# Patient Record
Sex: Male | Born: 1970 | Race: Black or African American | Hispanic: No | Marital: Single | Smoking: Never smoker
Health system: Southern US, Community
[De-identification: ages and names within clinical notes are randomized; demographics above are authoritative.]

## PROBLEM LIST (undated history)

## (undated) DIAGNOSIS — T8859XA Other complications of anesthesia, initial encounter: Secondary | ICD-10-CM

## (undated) DIAGNOSIS — R011 Cardiac murmur, unspecified: Secondary | ICD-10-CM

## (undated) DIAGNOSIS — I10 Essential (primary) hypertension: Secondary | ICD-10-CM

## (undated) DIAGNOSIS — D509 Iron deficiency anemia, unspecified: Secondary | ICD-10-CM

## (undated) DIAGNOSIS — Z992 Dependence on renal dialysis: Secondary | ICD-10-CM

## (undated) DIAGNOSIS — F429 Obsessive-compulsive disorder, unspecified: Secondary | ICD-10-CM

## (undated) DIAGNOSIS — N400 Enlarged prostate without lower urinary tract symptoms: Secondary | ICD-10-CM

## (undated) DIAGNOSIS — F840 Autistic disorder: Secondary | ICD-10-CM

## (undated) DIAGNOSIS — N186 End stage renal disease: Secondary | ICD-10-CM

## (undated) DIAGNOSIS — E669 Obesity, unspecified: Secondary | ICD-10-CM

## (undated) DIAGNOSIS — N189 Chronic kidney disease, unspecified: Secondary | ICD-10-CM

## (undated) DIAGNOSIS — N2581 Secondary hyperparathyroidism of renal origin: Secondary | ICD-10-CM

---

## 2020-04-02 ENCOUNTER — Other Ambulatory Visit: Payer: Self-pay

## 2020-04-02 ENCOUNTER — Emergency Department: Payer: Medicare Other

## 2020-04-02 DIAGNOSIS — D696 Thrombocytopenia, unspecified: Secondary | ICD-10-CM | POA: Diagnosis present

## 2020-04-02 DIAGNOSIS — E669 Obesity, unspecified: Secondary | ICD-10-CM | POA: Diagnosis present

## 2020-04-02 DIAGNOSIS — I161 Hypertensive emergency: Secondary | ICD-10-CM | POA: Diagnosis present

## 2020-04-02 DIAGNOSIS — N320 Bladder-neck obstruction: Secondary | ICD-10-CM | POA: Diagnosis present

## 2020-04-02 DIAGNOSIS — M6282 Rhabdomyolysis: Secondary | ICD-10-CM | POA: Diagnosis present

## 2020-04-02 DIAGNOSIS — T471X5A Adverse effect of other antacids and anti-gastric-secretion drugs, initial encounter: Secondary | ICD-10-CM | POA: Diagnosis not present

## 2020-04-02 DIAGNOSIS — R338 Other retention of urine: Secondary | ICD-10-CM | POA: Diagnosis present

## 2020-04-02 DIAGNOSIS — N179 Acute kidney failure, unspecified: Secondary | ICD-10-CM | POA: Diagnosis not present

## 2020-04-02 DIAGNOSIS — R809 Proteinuria, unspecified: Secondary | ICD-10-CM | POA: Diagnosis present

## 2020-04-02 DIAGNOSIS — N401 Enlarged prostate with lower urinary tract symptoms: Secondary | ICD-10-CM | POA: Diagnosis present

## 2020-04-02 DIAGNOSIS — Y92230 Patient room in hospital as the place of occurrence of the external cause: Secondary | ICD-10-CM | POA: Diagnosis not present

## 2020-04-02 DIAGNOSIS — R7401 Elevation of levels of liver transaminase levels: Secondary | ICD-10-CM | POA: Diagnosis present

## 2020-04-02 DIAGNOSIS — R748 Abnormal levels of other serum enzymes: Secondary | ICD-10-CM | POA: Diagnosis present

## 2020-04-02 DIAGNOSIS — Y848 Other medical procedures as the cause of abnormal reaction of the patient, or of later complication, without mention of misadventure at the time of the procedure: Secondary | ICD-10-CM | POA: Diagnosis not present

## 2020-04-02 DIAGNOSIS — N186 End stage renal disease: Secondary | ICD-10-CM | POA: Diagnosis present

## 2020-04-02 DIAGNOSIS — I120 Hypertensive chronic kidney disease with stage 5 chronic kidney disease or end stage renal disease: Secondary | ICD-10-CM | POA: Diagnosis present

## 2020-04-02 DIAGNOSIS — S60821A Blister (nonthermal) of right wrist, initial encounter: Secondary | ICD-10-CM | POA: Diagnosis not present

## 2020-04-02 DIAGNOSIS — E876 Hypokalemia: Secondary | ICD-10-CM | POA: Diagnosis present

## 2020-04-02 DIAGNOSIS — Z992 Dependence on renal dialysis: Secondary | ICD-10-CM

## 2020-04-02 DIAGNOSIS — R000 Tachycardia, unspecified: Secondary | ICD-10-CM | POA: Diagnosis not present

## 2020-04-02 DIAGNOSIS — R1110 Vomiting, unspecified: Secondary | ICD-10-CM | POA: Diagnosis not present

## 2020-04-02 DIAGNOSIS — D631 Anemia in chronic kidney disease: Secondary | ICD-10-CM | POA: Diagnosis present

## 2020-04-02 DIAGNOSIS — Z883 Allergy status to other anti-infective agents status: Secondary | ICD-10-CM

## 2020-04-02 DIAGNOSIS — F429 Obsessive-compulsive disorder, unspecified: Secondary | ICD-10-CM | POA: Diagnosis present

## 2020-04-02 DIAGNOSIS — Z20822 Contact with and (suspected) exposure to covid-19: Secondary | ICD-10-CM | POA: Diagnosis present

## 2020-04-02 DIAGNOSIS — I959 Hypotension, unspecified: Secondary | ICD-10-CM | POA: Diagnosis not present

## 2020-04-02 DIAGNOSIS — D509 Iron deficiency anemia, unspecified: Secondary | ICD-10-CM | POA: Diagnosis present

## 2020-04-02 DIAGNOSIS — S60521A Blister (nonthermal) of right hand, initial encounter: Secondary | ICD-10-CM | POA: Diagnosis not present

## 2020-04-02 DIAGNOSIS — R9431 Abnormal electrocardiogram [ECG] [EKG]: Secondary | ICD-10-CM | POA: Diagnosis present

## 2020-04-02 DIAGNOSIS — N2581 Secondary hyperparathyroidism of renal origin: Secondary | ICD-10-CM | POA: Diagnosis present

## 2020-04-02 DIAGNOSIS — R778 Other specified abnormalities of plasma proteins: Secondary | ICD-10-CM | POA: Diagnosis present

## 2020-04-02 DIAGNOSIS — F819 Developmental disorder of scholastic skills, unspecified: Secondary | ICD-10-CM | POA: Diagnosis present

## 2020-04-02 DIAGNOSIS — E872 Acidosis: Secondary | ICD-10-CM | POA: Diagnosis present

## 2020-04-02 DIAGNOSIS — F840 Autistic disorder: Secondary | ICD-10-CM | POA: Diagnosis present

## 2020-04-02 DIAGNOSIS — Z841 Family history of disorders of kidney and ureter: Secondary | ICD-10-CM

## 2020-04-02 DIAGNOSIS — K529 Noninfective gastroenteritis and colitis, unspecified: Secondary | ICD-10-CM | POA: Diagnosis present

## 2020-04-02 DIAGNOSIS — Z6837 Body mass index (BMI) 37.0-37.9, adult: Secondary | ICD-10-CM

## 2020-04-02 DIAGNOSIS — K089 Disorder of teeth and supporting structures, unspecified: Secondary | ICD-10-CM | POA: Diagnosis present

## 2020-04-02 DIAGNOSIS — T80818A Extravasation of other vesicant agent, initial encounter: Secondary | ICD-10-CM | POA: Diagnosis not present

## 2020-04-02 DIAGNOSIS — N138 Other obstructive and reflux uropathy: Secondary | ICD-10-CM | POA: Diagnosis present

## 2020-04-02 LAB — COMPREHENSIVE METABOLIC PANEL
ALT: 69 U/L — ABNORMAL HIGH (ref 0–44)
AST: 51 U/L — ABNORMAL HIGH (ref 15–41)
Albumin: 3.8 g/dL (ref 3.5–5.0)
Alkaline Phosphatase: 58 U/L (ref 38–126)
Anion gap: 17 — ABNORMAL HIGH (ref 5–15)
BUN: 74 mg/dL — ABNORMAL HIGH (ref 6–20)
CO2: 14 mmol/L — ABNORMAL LOW (ref 22–32)
Calcium: 4.1 mg/dL — CL (ref 8.9–10.3)
Chloride: 108 mmol/L (ref 98–111)
Creatinine, Ser: 14.88 mg/dL — ABNORMAL HIGH (ref 0.61–1.24)
GFR calc Af Amer: 4 mL/min — ABNORMAL LOW (ref >60)
GFR calc non Af Amer: 3 mL/min — ABNORMAL LOW (ref >60)
Glucose, Bld: 91 mg/dL (ref 70–99)
Potassium: 3.9 mmol/L (ref 3.5–5.1)
Sodium: 139 mmol/L (ref 135–145)
Total Bilirubin: 0.5 mg/dL (ref 0.3–1.2)
Total Protein: 7 g/dL (ref 6.5–8.1)

## 2020-04-02 LAB — CBC
HCT: 30.3 % — ABNORMAL LOW (ref 39.0–52.0)
Hemoglobin: 10.1 g/dL — ABNORMAL LOW (ref 13.0–17.0)
MCH: 27.7 pg (ref 26.0–34.0)
MCHC: 33.3 g/dL (ref 30.0–36.0)
MCV: 83.2 fL (ref 80.0–100.0)
Platelets: 180 10*3/uL (ref 150–400)
RBC: 3.64 MIL/uL — ABNORMAL LOW (ref 4.22–5.81)
RDW: 14.8 % (ref 11.5–15.5)
WBC: 9.8 10*3/uL (ref 4.0–10.5)
nRBC: 0 % (ref 0.0–0.2)

## 2020-04-02 LAB — LIPASE, BLOOD: Lipase: 90 U/L — ABNORMAL HIGH (ref 11–51)

## 2020-04-02 NOTE — ED Triage Notes (Signed)
Pt comes from home with family due to having one episode of vomiting today and shortness of breath. Pt is reported to have excessive salivation since vomiting at home and father states this has been going on for the last 2 weeks. Pt also reports SOB but no distress is noted.Reproted that pt has vomited x2 in two weeks, pt father states that he used a portable pulse ox at home and believes the oxygen sats read 85% but in ED with triage pt is 100% on RA

## 2020-04-03 ENCOUNTER — Inpatient Hospital Stay
Admission: EM | Admit: 2020-04-03 | Discharge: 2020-04-14 | DRG: 674 | Disposition: A | Discharge status: Home Health | Payer: Medicare Other | Attending: Internal Medicine | Admitting: Internal Medicine

## 2020-04-03 ENCOUNTER — Emergency Department: Payer: Medicare Other

## 2020-04-03 DIAGNOSIS — I160 Hypertensive urgency: Secondary | ICD-10-CM

## 2020-04-03 DIAGNOSIS — Z20822 Contact with and (suspected) exposure to covid-19: Secondary | ICD-10-CM | POA: Diagnosis present

## 2020-04-03 DIAGNOSIS — T471X5A Adverse effect of other antacids and anti-gastric-secretion drugs, initial encounter: Secondary | ICD-10-CM | POA: Diagnosis not present

## 2020-04-03 DIAGNOSIS — K047 Periapical abscess without sinus: Secondary | ICD-10-CM | POA: Diagnosis not present

## 2020-04-03 DIAGNOSIS — R1110 Vomiting, unspecified: Secondary | ICD-10-CM | POA: Diagnosis present

## 2020-04-03 DIAGNOSIS — I10 Essential (primary) hypertension: Secondary | ICD-10-CM | POA: Insufficient documentation

## 2020-04-03 DIAGNOSIS — N401 Enlarged prostate with lower urinary tract symptoms: Secondary | ICD-10-CM | POA: Diagnosis present

## 2020-04-03 DIAGNOSIS — N138 Other obstructive and reflux uropathy: Secondary | ICD-10-CM | POA: Diagnosis present

## 2020-04-03 DIAGNOSIS — N320 Bladder-neck obstruction: Secondary | ICD-10-CM | POA: Diagnosis present

## 2020-04-03 DIAGNOSIS — N189 Chronic kidney disease, unspecified: Secondary | ICD-10-CM | POA: Diagnosis not present

## 2020-04-03 DIAGNOSIS — I161 Hypertensive emergency: Secondary | ICD-10-CM | POA: Diagnosis present

## 2020-04-03 DIAGNOSIS — R9431 Abnormal electrocardiogram [ECG] [EKG]: Secondary | ICD-10-CM

## 2020-04-03 DIAGNOSIS — F429 Obsessive-compulsive disorder, unspecified: Secondary | ICD-10-CM | POA: Diagnosis present

## 2020-04-03 DIAGNOSIS — K089 Disorder of teeth and supporting structures, unspecified: Secondary | ICD-10-CM | POA: Diagnosis present

## 2020-04-03 DIAGNOSIS — R748 Abnormal levels of other serum enzymes: Secondary | ICD-10-CM | POA: Diagnosis present

## 2020-04-03 DIAGNOSIS — R7989 Other specified abnormal findings of blood chemistry: Secondary | ICD-10-CM | POA: Diagnosis present

## 2020-04-03 DIAGNOSIS — R531 Weakness: Secondary | ICD-10-CM | POA: Diagnosis not present

## 2020-04-03 DIAGNOSIS — R778 Other specified abnormalities of plasma proteins: Secondary | ICD-10-CM | POA: Diagnosis present

## 2020-04-03 DIAGNOSIS — R509 Fever, unspecified: Secondary | ICD-10-CM | POA: Diagnosis not present

## 2020-04-03 DIAGNOSIS — F840 Autistic disorder: Secondary | ICD-10-CM | POA: Diagnosis present

## 2020-04-03 DIAGNOSIS — N400 Enlarged prostate without lower urinary tract symptoms: Secondary | ICD-10-CM | POA: Diagnosis present

## 2020-04-03 DIAGNOSIS — R339 Retention of urine, unspecified: Secondary | ICD-10-CM | POA: Diagnosis not present

## 2020-04-03 DIAGNOSIS — E872 Acidosis, unspecified: Secondary | ICD-10-CM | POA: Diagnosis present

## 2020-04-03 DIAGNOSIS — M6282 Rhabdomyolysis: Secondary | ICD-10-CM

## 2020-04-03 DIAGNOSIS — S60521A Blister (nonthermal) of right hand, initial encounter: Secondary | ICD-10-CM | POA: Diagnosis not present

## 2020-04-03 DIAGNOSIS — I129 Hypertensive chronic kidney disease with stage 1 through stage 4 chronic kidney disease, or unspecified chronic kidney disease: Secondary | ICD-10-CM | POA: Diagnosis not present

## 2020-04-03 DIAGNOSIS — I120 Hypertensive chronic kidney disease with stage 5 chronic kidney disease or end stage renal disease: Secondary | ICD-10-CM | POA: Diagnosis present

## 2020-04-03 DIAGNOSIS — K529 Noninfective gastroenteritis and colitis, unspecified: Secondary | ICD-10-CM

## 2020-04-03 DIAGNOSIS — D638 Anemia in other chronic diseases classified elsewhere: Secondary | ICD-10-CM | POA: Diagnosis not present

## 2020-04-03 DIAGNOSIS — Z992 Dependence on renal dialysis: Secondary | ICD-10-CM | POA: Diagnosis not present

## 2020-04-03 DIAGNOSIS — N186 End stage renal disease: Secondary | ICD-10-CM | POA: Diagnosis present

## 2020-04-03 DIAGNOSIS — Y848 Other medical procedures as the cause of abnormal reaction of the patient, or of later complication, without mention of misadventure at the time of the procedure: Secondary | ICD-10-CM | POA: Diagnosis not present

## 2020-04-03 DIAGNOSIS — N185 Chronic kidney disease, stage 5: Secondary | ICD-10-CM | POA: Diagnosis not present

## 2020-04-03 DIAGNOSIS — D696 Thrombocytopenia, unspecified: Secondary | ICD-10-CM | POA: Diagnosis present

## 2020-04-03 DIAGNOSIS — S60821A Blister (nonthermal) of right wrist, initial encounter: Secondary | ICD-10-CM | POA: Diagnosis not present

## 2020-04-03 DIAGNOSIS — D631 Anemia in chronic kidney disease: Secondary | ICD-10-CM | POA: Diagnosis present

## 2020-04-03 DIAGNOSIS — N179 Acute kidney failure, unspecified: Secondary | ICD-10-CM

## 2020-04-03 DIAGNOSIS — T80818A Extravasation of other vesicant agent, initial encounter: Secondary | ICD-10-CM | POA: Diagnosis not present

## 2020-04-03 DIAGNOSIS — Y92230 Patient room in hospital as the place of occurrence of the external cause: Secondary | ICD-10-CM | POA: Diagnosis not present

## 2020-04-03 HISTORY — DX: Cardiac murmur, unspecified: R01.1

## 2020-04-03 HISTORY — DX: Obsessive-compulsive disorder, unspecified: F42.9

## 2020-04-03 HISTORY — DX: Hypertensive urgency: I16.0

## 2020-04-03 HISTORY — DX: Autistic disorder: F84.0

## 2020-04-03 LAB — COMPREHENSIVE METABOLIC PANEL
ALT: 66 U/L — ABNORMAL HIGH (ref 0–44)
AST: 48 U/L — ABNORMAL HIGH (ref 15–41)
Albumin: 3.9 g/dL (ref 3.5–5.0)
Alkaline Phosphatase: 62 U/L (ref 38–126)
Anion gap: 14 (ref 5–15)
BUN: 73 mg/dL — ABNORMAL HIGH (ref 6–20)
CO2: 19 mmol/L — ABNORMAL LOW (ref 22–32)
Calcium: 4.1 mg/dL — CL (ref 8.9–10.3)
Chloride: 110 mmol/L (ref 98–111)
Creatinine, Ser: 15.2 mg/dL — ABNORMAL HIGH (ref 0.61–1.24)
GFR calc Af Amer: 4 mL/min — ABNORMAL LOW (ref >60)
GFR calc non Af Amer: 3 mL/min — ABNORMAL LOW (ref >60)
Glucose, Bld: 96 mg/dL (ref 70–99)
Potassium: 4.1 mmol/L (ref 3.5–5.1)
Sodium: 143 mmol/L (ref 135–145)
Total Bilirubin: 0.8 mg/dL (ref 0.3–1.2)
Total Protein: 7.6 g/dL (ref 6.5–8.1)

## 2020-04-03 LAB — CBC
HCT: 29.4 % — ABNORMAL LOW (ref 39.0–52.0)
Hemoglobin: 9.9 g/dL — ABNORMAL LOW (ref 13.0–17.0)
MCH: 28.1 pg (ref 26.0–34.0)
MCHC: 33.7 g/dL (ref 30.0–36.0)
MCV: 83.5 fL (ref 80.0–100.0)
Platelets: 189 10*3/uL (ref 150–400)
RBC: 3.52 MIL/uL — ABNORMAL LOW (ref 4.22–5.81)
RDW: 14.8 % (ref 11.5–15.5)
WBC: 10.9 10*3/uL — ABNORMAL HIGH (ref 4.0–10.5)
nRBC: 0 % (ref 0.0–0.2)

## 2020-04-03 LAB — MAGNESIUM: Magnesium: 1.5 mg/dL — ABNORMAL LOW (ref 1.7–2.4)

## 2020-04-03 LAB — URINALYSIS, COMPLETE (UACMP) WITH MICROSCOPIC
Bilirubin Urine: NEGATIVE
Glucose, UA: NEGATIVE mg/dL
Ketones, ur: NEGATIVE mg/dL
Leukocytes,Ua: NEGATIVE
Nitrite: NEGATIVE
Protein, ur: 100 mg/dL — AB
Specific Gravity, Urine: 1.011 (ref 1.005–1.030)
pH: 5 (ref 5.0–8.0)

## 2020-04-03 LAB — BASIC METABOLIC PANEL
Anion gap: 15 (ref 5–15)
BUN: 74 mg/dL — ABNORMAL HIGH (ref 6–20)
CO2: 14 mmol/L — ABNORMAL LOW (ref 22–32)
Calcium: 4.4 mg/dL — CL (ref 8.9–10.3)
Chloride: 111 mmol/L (ref 98–111)
Creatinine, Ser: 15.07 mg/dL — ABNORMAL HIGH (ref 0.61–1.24)
GFR calc Af Amer: 4 mL/min — ABNORMAL LOW (ref >60)
GFR calc non Af Amer: 3 mL/min — ABNORMAL LOW (ref >60)
Glucose, Bld: 108 mg/dL — ABNORMAL HIGH (ref 70–99)
Potassium: 4 mmol/L (ref 3.5–5.1)
Sodium: 140 mmol/L (ref 135–145)

## 2020-04-03 LAB — PROTEIN / CREATININE RATIO, URINE
Creatinine, Urine: 187 mg/dL
Protein Creatinine Ratio: 0.41 mg/mg{Cre} — ABNORMAL HIGH (ref 0.00–0.15)
Total Protein, Urine: 76 mg/dL

## 2020-04-03 LAB — TSH: TSH: 0.509 u[IU]/mL (ref 0.350–4.500)

## 2020-04-03 LAB — TROPONIN I (HIGH SENSITIVITY)
Troponin I (High Sensitivity): 38 ng/L — ABNORMAL HIGH (ref <18)
Troponin I (High Sensitivity): 43 ng/L — ABNORMAL HIGH (ref <18)

## 2020-04-03 LAB — SARS CORONAVIRUS 2 BY RT PCR (HOSPITAL ORDER, PERFORMED IN ~~LOC~~ HOSPITAL LAB): SARS Coronavirus 2: NEGATIVE

## 2020-04-03 LAB — T4, FREE: Free T4: 1.09 ng/dL (ref 0.61–1.12)

## 2020-04-03 LAB — HIV ANTIBODY (ROUTINE TESTING W REFLEX)
HIV Screen 4th Generation wRfx: NONREACTIVE
HIV Screen 4th Generation wRfx: NONREACTIVE

## 2020-04-03 LAB — HEPATITIS B SURFACE ANTIGEN: Hepatitis B Surface Ag: NONREACTIVE

## 2020-04-03 LAB — CK: Total CK: 2732 U/L — ABNORMAL HIGH (ref 49–397)

## 2020-04-03 LAB — PHOSPHORUS: Phosphorus: 6.3 mg/dL — ABNORMAL HIGH (ref 2.5–4.6)

## 2020-04-03 MED ORDER — LABETALOL HCL 5 MG/ML IV SOLN
10.0000 mg | INTRAVENOUS | Status: DC | PRN
  Administered 2020-04-03 – 2020-04-04 (×4): 10 mg via INTRAVENOUS
  Filled 2020-04-03 (×5): qty 4

## 2020-04-03 MED ORDER — ACETAMINOPHEN 650 MG RE SUPP: 650.0000 mg | Freq: Four times a day (QID) | RECTAL | Status: DC | PRN

## 2020-04-03 MED ORDER — CALCIUM GLUCONATE-NACL 1-0.675 GM/50ML-% IV SOLN
1.0000 g | Freq: Once | INTRAVENOUS | Status: AC
  Administered 2020-04-03: 1000 mg via INTRAVENOUS
  Filled 2020-04-03: qty 50

## 2020-04-03 MED ORDER — SODIUM BICARBONATE-DEXTROSE 150-5 MEQ/L-% IV SOLN: 150.0000 meq | INTRAVENOUS | Status: DC

## 2020-04-03 MED ORDER — CALCIUM GLUCONATE-NACL 1-0.675 GM/50ML-% IV SOLN
1.0000 g | Freq: Three times a day (TID) | INTRAVENOUS | Status: AC
  Administered 2020-04-03 – 2020-04-05 (×6): 1000 mg via INTRAVENOUS
  Filled 2020-04-03 (×6): qty 50

## 2020-04-03 MED ORDER — HYDRALAZINE HCL 20 MG/ML IJ SOLN
10.0000 mg | INTRAMUSCULAR | Status: DC | PRN
  Administered 2020-04-03 – 2020-04-08 (×9): 10 mg via INTRAVENOUS
  Filled 2020-04-03 (×11): qty 1

## 2020-04-03 MED ORDER — AMLODIPINE BESYLATE 5 MG PO TABS
5.0000 mg | ORAL_TABLET | Freq: Every day | ORAL | Status: DC
  Administered 2020-04-03 – 2020-04-04 (×2): 5 mg via ORAL
  Filled 2020-04-03 (×2): qty 1

## 2020-04-03 MED ORDER — SODIUM BICARBONATE 8.4 % IV SOLN
INTRAVENOUS | Status: DC
  Filled 2020-04-03 (×12): qty 150

## 2020-04-03 MED ORDER — ACETAMINOPHEN 325 MG PO TABS
650.0000 mg | ORAL_TABLET | Freq: Four times a day (QID) | ORAL | Status: DC | PRN
  Administered 2020-04-03 – 2020-04-07 (×6): 650 mg via ORAL
  Filled 2020-04-03 (×7): qty 2

## 2020-04-03 MED ORDER — CALCIUM GLUCONATE 10 % IV SOLN: 1.0000 g | Freq: Once | INTRAVENOUS | Status: DC

## 2020-04-03 MED ORDER — LORAZEPAM 0.5 MG PO TABS
0.5000 mg | ORAL_TABLET | Freq: Three times a day (TID) | ORAL | Status: DC | PRN
  Administered 2020-04-03: 0.5 mg via ORAL
  Filled 2020-04-03: qty 1

## 2020-04-03 MED ORDER — HEPARIN SODIUM (PORCINE) 5000 UNIT/ML IJ SOLN
5000.0000 [IU] | Freq: Three times a day (TID) | INTRAMUSCULAR | Status: DC
  Administered 2020-04-03 – 2020-04-14 (×29): 5000 [IU] via SUBCUTANEOUS
  Filled 2020-04-03 (×29): qty 1

## 2020-04-03 MED ORDER — SODIUM BICARBONATE-DEXTROSE 150-5 MEQ/L-% IV SOLN
150.0000 meq | INTRAVENOUS | Status: DC
  Administered 2020-04-03: 150 meq via INTRAVENOUS
  Filled 2020-04-03 (×3): qty 1000

## 2020-04-03 MED ORDER — SODIUM CHLORIDE 0.9 % IV BOLUS
1000.0000 mL | Freq: Once | INTRAVENOUS | Status: AC
  Administered 2020-04-03: 1000 mL via INTRAVENOUS

## 2020-04-03 NOTE — ED Provider Notes (Signed)
[facility] Emergency Department Provider Note  ____________________________________________   First MD Initiated Contact with Patient 04/03/20 0225     (approximate)  I have reviewed the triage vital signs and the nursing notes.  Level 5 caveat history review of system limited secondary to patient with autism and as such history primarily obtained from the patient's father HISTORY  Chief Complaint Emesis and Shortness of Breath    HPI Joseph Gilmore is a 49 y.o. male with history of autism presents to the emergency department in custody of his father.  Patient's father states that patient's had vomiting and dyspnea today with occasional cough and excessive salivation.  Patient's father states that this has been intermittent over the past 2 weeks.  He denies that his son has had any diarrhea.  States that the patient is followed by Dr. Brunetta Genera but that they have not seen him in a "long time, years".  Patient's father states that he has not had a fever to his knowledge.  Patient denies any complaints        Past Medical History:  Diagnosis Date  . Autism   . Murmur   . OCD (obsessive compulsive disorder)     Patient Active Problem List   Diagnosis Date Noted  . Autism 04/03/2020  . AKI (acute kidney injury) ([place]) 04/03/2020  . Metabolic acidosis 85/27/7824  . Hypocalcemia 04/03/2020  . Anemia 04/03/2020  . Elevated LFTs 04/03/2020  . Elevated lipase 04/03/2020    History reviewed. No pertinent surgical history.  Prior to Admission medications   Not on File    Allergies Patient has no known allergies.  History reviewed. No pertinent family history.  Social History Social History   Tobacco Use  . Smoking status: Never Smoker  . Smokeless tobacco: Never Used  Vaping Use  . Vaping Use: Never used  Substance Use Topics  . Alcohol use: Never  . Drug use: Never    Review of Systems Constitutional: No fever/chills Eyes: No visual  changes. ENT: No sore throat. Cardiovascular: Denies chest pain. Respiratory: Denies shortness of breath. Gastrointestinal: No abdominal pain.  No nausea, positive for vomiting.  No diarrhea.  No constipation. Genitourinary: Negative for dysuria. Musculoskeletal: Negative for neck pain.  Negative for back pain. Integumentary: Negative for rash. Neurological: Negative for headaches, focal weakness or numbness.   ____________________________________________   PHYSICAL EXAM:  VITAL SIGNS: ED Triage Vitals  Enc Vitals Group     BP 04/02/20 2243 (!) 187/108     Pulse Rate 04/02/20 2243 86     Resp 04/02/20 2243 20     Temp 04/02/20 2243 99.5 F (37.5 C)     Temp Source 04/02/20 2243 Oral     SpO2 04/02/20 2243 100 %     Weight 04/02/20 2244 113.4 kg (250 lb)     Height 04/02/20 2244 1.727 m (5\' 8" )     Head Circumference --      Peak Flow --      Pain Score 04/02/20 2244 0     Pain Loc --      Pain Edu? --      Excl. in [place]? --     Constitutional: Alert and oriented.  Eyes: Conjunctivae are normal.  Head: Atraumatic. Mouth/Throat: Dry oral mucosa Neck: No stridor.  No meningeal signs.   Cardiovascular: Normal rate, regular rhythm. Good peripheral circulation. Grossly normal heart sounds. Respiratory: Normal respiratory effort.  No retractions. Gastrointestinal: Soft and nontender. No distention.  Musculoskeletal: No lower extremity tenderness nor edema. No gross deformities of extremities. Neurologic:  Normal speech and language. No gross focal neurologic deficits are appreciated.  Skin:  Skin is warm, dry and intact. Psychiatric: Mood and affect are normal. Speech and behavior are normal.  ____________________________________________   LABS (all labs ordered are listed, but only abnormal results are displayed)  Labs Reviewed  LIPASE, BLOOD - Abnormal; Notable for the following components:      Result Value   Lipase 90 (*)    All other components within normal  limits  COMPREHENSIVE METABOLIC PANEL - Abnormal; Notable for the following components:   CO2 14 (*)    BUN 74 (*)    Creatinine, Ser 14.88 (*)    Calcium 4.1 (*)    AST 51 (*)    ALT 69 (*)    GFR calc non Af Amer 3 (*)    GFR calc Af Amer 4 (*)    Anion gap 17 (*)    All other components within normal limits  CBC - Abnormal; Notable for the following components:   RBC 3.64 (*)    Hemoglobin 10.1 (*)    HCT 30.3 (*)    All other components within normal limits  URINALYSIS, COMPLETE (UACMP) WITH MICROSCOPIC - Abnormal; Notable for the following components:   Color, Urine YELLOW (*)    APPearance HAZY (*)    Hgb urine dipstick MODERATE (*)    Protein, ur 100 (*)    Bacteria, UA RARE (*)    All other components within normal limits  COMPREHENSIVE METABOLIC PANEL - Abnormal; Notable for the following components:   CO2 19 (*)    BUN 73 (*)    Creatinine, Ser 15.20 (*)    Calcium 4.1 (*)    AST 48 (*)    ALT 66 (*)    GFR calc non Af Amer 3 (*)    GFR calc Af Amer 4 (*)    All other components within normal limits  TROPONIN I (HIGH SENSITIVITY) - Abnormal; Notable for the following components:   Troponin I (High Sensitivity) 38 (*)    All other components within normal limits  TROPONIN I (HIGH SENSITIVITY) - Abnormal; Notable for the following components:   Troponin I (High Sensitivity) 43 (*)    All other components within normal limits  SARS CORONAVIRUS 2 BY RT PCR (HOSPITAL ORDER, [place] [facility])  MAGNESIUM   ____________________________________________  EKG  ED ECG REPORT I, Alliance N Malasia Torain, the attending physician, personally viewed and interpreted this ECG.   Date: 04/02/2020  EKG Time: 10:43 PM  Rate: 86  Rhythm: Normal sinus rhythm  Axis: Normal  Intervals: Prolonged QT 526  ST&T Change: None  ____________________________________________  RADIOLOGY I, Driscoll N Shaunie Boehm, personally viewed and evaluated these images (plain  radiographs) as part of my medical decision making, as well as reviewing the written report by the radiologist.  ED MD interpretation: Fluid-filled nondistended small bowel was made acute enteritis no evidence of obstruction enlarged prostate fused bladder wall thickening per radiologist on CT abdomen pelvis impression.  Official radiology report(s): CT ABDOMEN PELVIS WO CONTRAST  Result Date: 04/03/2020 CLINICAL DATA:  Nausea, vomiting, and shortness of breath EXAM: CT ABDOMEN AND PELVIS WITHOUT CONTRAST TECHNIQUE: Multidetector CT imaging of the abdomen and pelvis was performed following the standard protocol without IV contrast. COMPARISON:  None. FINDINGS: Lower chest: Motion artifact limits evaluation. Lung bases appear clear grossly. Hepatobiliary: No focal liver abnormality  is seen. No gallstones, gallbladder wall thickening, or biliary dilatation. Pancreas: Unremarkable. No pancreatic ductal dilatation or surrounding inflammatory changes. Spleen: Normal in size without focal abnormality. Adrenals/Urinary Tract: Adrenal glands are unremarkable. Kidneys are normal, without renal calculi, focal lesion, or hydronephrosis. Bladder wall is diffusely thickened, likely due to hypertrophy from outlet obstruction. Stomach/Bowel: Stomach, small bowel, and colon are not abnormally distended. Diffusely fluid-filled small bowel without wall thickening. Scattered stool in the colon. Colonic diverticula. No wall thickening or inflammatory changes. Vascular/Lymphatic: No significant vascular findings are present. No enlarged abdominal or pelvic lymph nodes. Reproductive: Prostate gland is enlarged, measuring 5.3 cm diameter. Other: No free air or free fluid in the abdomen. Abdominal wall musculature appears intact. Musculoskeletal: No acute or significant osseous findings. IMPRESSION: 1. Fluid-filled nondistended small bowel may indicate enteritis. No evidence of obstruction. 2. Enlarged prostate gland. 3. Diffuse  bladder wall thickening likely due to hypertrophy from outlet obstruction. Electronically Signed   By: Lucienne Capers M.D.   On: 04/03/2020 04:00   DG Chest 2 View  Result Date: 04/02/2020 CLINICAL DATA:  Shortness of breath EXAM: CHEST - 2 VIEW COMPARISON:  None. FINDINGS: The heart size and mediastinal contours are within normal limits. Both lungs are clear. The visualized skeletal structures are unremarkable. IMPRESSION: No active cardiopulmonary disease. Electronically Signed   By: Donavan Foil M.D.   On: 04/02/2020 23:11    _______________________________________  Procedures   ____________________________________________   INITIAL IMPRESSION / MDM / [place] / ED COURSE  As part of my medical decision making, I reviewed the following data within the electronic MEDICAL RECORD NUMBER 49 year old male presenting with above-stated history and physical exam with laboratory data revealing BUN of 74 creatinine of 14.88 without any comparison.  Also patient's calcium noted to be 4.1 with an anion gap of 17.  Patient's lipase 90 elevated AST and ALT 51 and 69 respectively troponin of 38 H&H 10.1 and 30.3.  Again there were no available comparisons for the beforementioned laboratory data.  Repeat sample of the CMP was obtained to confirm accuracy which is relatively consistent with the beforementioned.  Patient given 1 L IV normal saline.  Patient given 1 g calcium gluconate IV over 1 hour.  CT scan of the abdomen pelvis revealed findings consistent with possible enteritis.  Patient discussed with Dr. Damita Dunnings for hospital admission for further evaluation and management.  ____________________________________________  FINAL CLINICAL IMPRESSION(S) / ED DIAGNOSES  Final diagnoses:  Hypocalcemia  Enteritis  Acute kidney injury ([place])  Prolonged Q-T interval on ECG     MEDICATIONS GIVEN DURING THIS VISIT:  Medications  calcium gluconate 1 g/ 50 mL sodium chloride IVPB (has no  administration in time range)  sodium chloride 0.9 % bolus 1,000 mL (1,000 mLs Intravenous New Bag/Given 04/03/20 0304)     ED Discharge Orders    None      *Please note:  ROBERTA KELLY was evaluated in Emergency Department on 04/03/2020 for the symptoms described in the history of present illness. He was evaluated in the context of the global COVID-19 pandemic, which necessitated consideration that the patient might be at risk for infection with the SARS-CoV-2 virus that causes COVID-19. Institutional protocols and algorithms that pertain to the evaluation of patients at risk for COVID-19 are in a state of rapid change based on information released by regulatory bodies including the CDC and federal and state organizations. These policies and algorithms were followed during the patient's care in the ED.  Some ED evaluations and interventions may be delayed as a result of limited staffing during and after the pandemic.*  Note:  This document was prepared using Dragon voice recognition software and may include unintentional dictation errors.   Gregor Hams, MD 04/03/20 [phone]

## 2020-04-03 NOTE — Plan of Care (Signed)
  Problem: Education: Goal: Knowledge of General Education information will improve Description Including pain rating scale, medication(s)/side effects and non-pharmacologic comfort measures Outcome: Progressing   

## 2020-04-03 NOTE — Consult Note (Signed)
[facility] CONSULT NOTE    Date: 04/03/2020                  Patient Name:  Joseph Gilmore  MRN: [id number]  DOB: November 05, 1970  Age / Sex: 49 y.o., male         PCP: [id number]                 Service Requesting Consult: Hospitalist                 Reason for Consult: Acute kidney injury            History of Present Illness: Patient is a 49 y.o. male with a PMHx of autism, hypertension, and OC, who was admitted to [facility] on 04/03/2020 for evaluation of vomiting.  It appears that the patient has not had any primary care in a number of years.  When he presented to the emergency department he had significantly elevated blood pressure of 221/136.  He had a CT scan of the abdomen pelvis which showed thickened bladder wall, large prostate consistent with bladder outlet obstruction.  However there was no hydronephrosis noted.  No other renal function testing to review.  His creatinine was 14.88 upon admission.  Currently creatinine 15.07.  Patient also noted to be anemic with a hemoglobin of 9.9.  Also significantly hypocalcemic with calcium of 4.4.  This suggest that there is some chronicity to his underlying kidney disease.   Medications: Outpatient medications: No medications prior to admission.    Current medications: Current Facility-Administered Medications  Medication Dose Route Frequency Provider Last Rate Last Admin  . acetaminophen (TYLENOL) tablet 650 mg  650 mg Oral Q6H PRN Athena Masse, MD   650 mg at 04/03/20 1503   Or  . acetaminophen (TYLENOL) suppository 650 mg  650 mg Rectal Q6H PRN Athena Masse, MD      . amLODipine (NORVASC) tablet 5 mg  5 mg Oral Daily Judd Gaudier V, MD   5 mg at 04/03/20 0949  . heparin injection 5,000 Units  5,000 Units Subcutaneous Q8H Athena Masse, MD   5,000 Units at 04/03/20 5916  . hydrALAZINE (APRESOLINE) injection 10 mg  10 mg Intravenous Q4H PRN Para Skeans, MD      . labetalol (NORMODYNE) injection 10  mg  10 mg Intravenous Q4H PRN Athena Masse, MD   10 mg at 04/03/20 1322  . sodium bicarbonate 150 mEq in dextrose 5 % 1,000 mL infusion   Intravenous Continuous Para Skeans, MD 100 mL/hr at 04/03/20 1512 Restarted at 04/03/20 1512      Allergies: No Known Allergies    Past Medical History: Past Medical History:  Diagnosis Date  . Autism   . Murmur   . OCD (obsessive compulsive disorder)      Past Surgical History: History reviewed. No pertinent surgical history.   Family History: History reviewed. No pertinent family history.   Social History: Social History   Socioeconomic History  . Marital status: Single    Spouse name: Not on file  . Number of children: Not on file  . Years of education: Not on file  . Highest education level: Not on file  Occupational History  . Not on file  Tobacco Use  . Smoking status: Never Smoker  . Smokeless tobacco: Never Used  Vaping Use  . Vaping Use: Never used  Substance and Sexual Activity  . Alcohol use: Never  .  Drug use: Never  . Sexual activity: Not on file  Other Topics Concern  . Not on file  Social History Narrative  . Not on file   Social Determinants of Health   Financial Resource Strain:   . Difficulty of Paying Living Expenses:   Food Insecurity:   . Worried About Charity fundraiser in the Last Year:   . Arboriculturist in the Last Year:   Transportation Needs:   . Film/video editor (Medical):   Marland Kitchen Lack of Transportation (Non-Medical):   Physical Activity:   . Days of Exercise per Week:   . Minutes of Exercise per Session:   Stress:   . Feeling of Stress :   Social Connections:   . Frequency of Communication with Friends and Family:   . Frequency of Social Gatherings with Friends and Family:   . Attends Religious Services:   . Active Member of Clubs or Organizations:   . Attends Archivist Meetings:   Marland Kitchen Marital Status:   Intimate Partner Violence:   . Fear of Current or  Ex-Partner:   . Emotionally Abused:   Marland Kitchen Physically Abused:   . Sexually Abused:      Review of Systems: As per HPI  Vital Signs: Blood pressure (!) 181/116, pulse 78, temperature 98.4 F (36.9 C), temperature source Oral, resp. rate 18, height 5\' 8"  (1.727 m), weight 113.4 kg, SpO2 100 %.  Weight trends: Filed Weights   04/02/20 2244  Weight: 113.4 kg    Physical Exam: Physical Exam: General:  No acute distress  Head:  Normocephalic, atraumatic. Moist oral mucosal membranes  Eyes:  Anicteric  Neck:  Supple  Lungs:   Clear to auscultation, normal effort  Heart:  S1S2 no rubs  Abdomen:   Soft, nontender, bowel sounds present  Extremities:  peripheral edema.  Neurologic:  Awake, alert, following commands  Skin:  No lesions  Access:     Lab results: Basic Metabolic Panel: Recent Labs  Lab 04/02/20 2248 04/03/20 0248 04/03/20 0721  NA 139 143 140  K 3.9 4.1 4.0  CL 108 110 111  CO2 14* 19* 14*  GLUCOSE 91 96 108*  BUN 74* 73* 74*  CREATININE 14.88* 15.20* 15.07*  CALCIUM 4.1* 4.1* 4.4*  MG  --   --  1.5*    Liver Function Tests: Recent Labs  Lab 04/02/20 2248 04/03/20 0248  AST 51* 48*  ALT 69* 66*  ALKPHOS 58 62  BILITOT 0.5 0.8  PROT 7.0 7.6  ALBUMIN 3.8 3.9   Recent Labs  Lab 04/02/20 2248  LIPASE 90*   No results for input(s): AMMONIA in the last 168 hours.  CBC: Recent Labs  Lab 04/02/20 2248 04/03/20 0721  WBC 9.8 10.9*  HGB 10.1* 9.9*  HCT 30.3* 29.4*  MCV 83.2 83.5  PLT 180 189    Cardiac Enzymes: Recent Labs  Lab 04/03/20 0721  CKTOTAL 2,732*    BNP: Invalid input(s): POCBNP  CBG: No results for input(s): GLUCAP in the last 168 hours.  Microbiology: Results for orders placed or performed during the hospital encounter of 04/03/20  SARS Coronavirus 2 by RT PCR (hospital order, performed in [facility] hospital lab) Nasopharyngeal Nasopharyngeal Swab     Status: None   Collection Time: 04/03/20  3:06 AM   Specimen:  Nasopharyngeal Swab  Result Value Ref Range Status   SARS Coronavirus 2 NEGATIVE NEGATIVE Final    Comment: (NOTE) SARS-CoV-2 target nucleic acids are  NOT DETECTED.  The SARS-CoV-2 RNA is generally detectable in upper and lower respiratory specimens during the acute phase of infection. The lowest concentration of SARS-CoV-2 viral copies this assay can detect is 250 copies / mL. A negative result does not preclude SARS-CoV-2 infection and should not be used as the sole basis for treatment or other patient management decisions.  A negative result may occur with improper specimen collection / handling, submission of specimen other than nasopharyngeal swab, presence of viral mutation(s) within the areas targeted by this assay, and inadequate number of viral copies (<250 copies / mL). A negative result must be combined with clinical observations, patient history, and epidemiological information.  Fact Sheet for Patients:   StrictlyIdeas.no  Fact Sheet for Healthcare Providers: BankingDealers.co.za  This test is not yet approved or  cleared by the [place] FDA and has been authorized for detection and/or diagnosis of SARS-CoV-2 by FDA under an Emergency Use Authorization (EUA).  This EUA will remain in effect (meaning this test can be used) for the duration of the COVID-19 declaration under Section 564(b)(1) of the Act, 21 U.S.C. section 360bbb-3(b)(1), unless the authorization is terminated or revoked sooner.  Performed at [facility], [address], [place] [address]     Coagulation Studies: No results for input(s): LABPROT, INR in the last 72 hours.  Urinalysis: Recent Labs    04/03/20 0201  COLORURINE YELLOW*  LABSPEC 1.011  PHURINE 5.0  GLUCOSEU NEGATIVE  HGBUR MODERATE*  BILIRUBINUR NEGATIVE  KETONESUR NEGATIVE  PROTEINUR 100*  NITRITE NEGATIVE  LEUKOCYTESUR NEGATIVE      Imaging: CT  ABDOMEN PELVIS WO CONTRAST  Result Date: 04/03/2020 CLINICAL DATA:  Nausea, vomiting, and shortness of breath EXAM: CT ABDOMEN AND PELVIS WITHOUT CONTRAST TECHNIQUE: Multidetector CT imaging of the abdomen and pelvis was performed following the standard protocol without IV contrast. COMPARISON:  None. FINDINGS: Lower chest: Motion artifact limits evaluation. Lung bases appear clear grossly. Hepatobiliary: No focal liver abnormality is seen. No gallstones, gallbladder wall thickening, or biliary dilatation. Pancreas: Unremarkable. No pancreatic ductal dilatation or surrounding inflammatory changes. Spleen: Normal in size without focal abnormality. Adrenals/Urinary Tract: Adrenal glands are unremarkable. Kidneys are normal, without renal calculi, focal lesion, or hydronephrosis. Bladder wall is diffusely thickened, likely due to hypertrophy from outlet obstruction. Stomach/Bowel: Stomach, small bowel, and colon are not abnormally distended. Diffusely fluid-filled small bowel without wall thickening. Scattered stool in the colon. Colonic diverticula. No wall thickening or inflammatory changes. Vascular/Lymphatic: No significant vascular findings are present. No enlarged abdominal or pelvic lymph nodes. Reproductive: Prostate gland is enlarged, measuring 5.3 cm diameter. Other: No free air or free fluid in the abdomen. Abdominal wall musculature appears intact. Musculoskeletal: No acute or significant osseous findings. IMPRESSION: 1. Fluid-filled nondistended small bowel may indicate enteritis. No evidence of obstruction. 2. Enlarged prostate gland. 3. Diffuse bladder wall thickening likely due to hypertrophy from outlet obstruction. Electronically Signed   By: Lucienne Capers M.D.   On: 04/03/2020 04:00   DG Chest 2 View  Result Date: 04/02/2020 CLINICAL DATA:  Shortness of breath EXAM: CHEST - 2 VIEW COMPARISON:  None. FINDINGS: The heart size and mediastinal contours are within normal limits. Both lungs are  clear. The visualized skeletal structures are unremarkable. IMPRESSION: No active cardiopulmonary disease. Electronically Signed   By: Donavan Foil M.D.   On: 04/02/2020 23:11      Assessment & Plan: Pt is a 49 y.o. male with a PMHx of autism, hypertension, and OC,  who was admitted to [facility] on 04/03/2020 for evaluation of vomiting.  It appears that the patient has not had any primary care in a number of years.  When he presented to the emergency department he had significantly elevated blood pressure of 221/136.  1.  Acute kidney injury.  We do not have a baseline creatinine available to [place] at the moment.  He does not appear to have any known underlying chronic kidney disease though he does have a first-degree cousin who has ESRD.  In fact he lives with his cousin who undergoes dialysis treatment.  CT scan abdomen pelvis was were reviewed and was negative for hydronephrosis.  However bladder wall was irregular and there was signs of proximal enlargement consistent with bladder outlet obstruction.  Foley catheter was placed and 500 cc of urine was obtained.  However patient also has severe hypocalcemia along with anemia which are suggestive of chronicity to his underlying kidney disease.  He may end up needing initiation of renal placement therapy over the weekend.  We will obtain further work-up including renal ultrasound, SPEP, UPEP, ANA, ANCA antibodies, GBM antibodies, C3, C4, HIV screen, hepatitis serologies.  Suspect that hypertension playing some role in underlying chronic kidney disease as his systolic blood pressure was greater than 220 upon admission.  2.  Hypocalcemia.  Suspected underlying secondary hyperparathyroidism.  Start the patient on calcium gluconate 1 g IV every 8 hours for now.  Check PTH as well as phosphorus.  3.  Suspected anemia of chronic kidney disease.  Hemoglobin 9.9.  No urgent indication for Procrit.  Continue to monitor CBC.  4.  Further plan based upon studies ordered  above.

## 2020-04-03 NOTE — H&P (Addendum)
History and Physical    Joseph Gilmore TFT:[id number] DOB: 1971/01/30 DOA: 04/03/2020  PCP: [facility]   Patient coming from: home  I have personally briefly reviewed patient's old medical records in [place]  Chief Complaint: Vomiting  HPI: Joseph Gilmore is a 49 y.o. male with medical history significant for autism brought in by his father with a 2-week history of intermittent vomiting and excessive salivation.  History limited as patient has autism and unable to contact father by phone, so history compiled from ER records and speaking with ER provider.  Patient apparently has not seen a primary care provider in several years. ED Course: On arrival, blood pressure elevated at 221/136, with low-grade temperature of 99.5 and otherwise normal vitals.  Blood work notable for creatinine of 14.88 with GFR below 3, bicarb 14, potassium 3.9, calcium 4.1, mildly elevated AST 51 ALT 69, lipase 90.  Hemoglobin 10.1.  Mild troponin elevation of 38/43.  Chest x-ray with no acute findings.  CT abdomen and pelvis showing enlarged prostate with thickened bladder wall consistent with bladder outlet obstruction.  Patient treated with calcium gluconate.  Hospitalist consulted for admission.  Review of Systems: Unable to obtain due to patient's autism  Past Medical History:  Diagnosis Date   Autism    Murmur    OCD (obsessive compulsive disorder)     History reviewed. No pertinent surgical history.   reports that he has never smoked. He has never used smokeless tobacco. He reports that he does not drink alcohol and does not use drugs.  No Known Allergies  History reviewed. No pertinent family history.    Prior to Admission medications   Not on File    Physical Exam: Vitals:   04/02/20 2243 04/02/20 2244  BP: (!) 187/108   Pulse: 86   Resp: 20   Temp: 99.5 F (37.5 C)   TempSrc: Oral   SpO2: 100%   Weight:  113.4 kg  Height:  5\' 8"  (1.727 m)     Vitals:   04/02/20  2243 04/02/20 2244  BP: (!) 187/108   Pulse: 86   Resp: 20   Temp: 99.5 F (37.5 C)   TempSrc: Oral   SpO2: 100%   Weight:  113.4 kg  Height:  5\' 8"  (1.727 m)      Constitutional: Alert .  Patient drooling HEENT:      Head: Normocephalic and atraumatic.         Eyes: PERLA, EOMI, Conjunctivae are normal. Sclera is non-icteric.       Mouth/Throat: Mucous membranes are moist.       Neck: Supple with no signs of meningismus. Cardiovascular: Regular rate and rhythm. No murmurs, gallops, or rubs. 2+ symmetrical distal pulses are present . No JVD. No LE edema Respiratory: Respiratory effort normal .Lungs sounds clear bilaterally. No wheezes, crackles, or rhonchi.  Gastrointestinal: Soft, non tender, and non distended with positive bowel sounds. No rebound or guarding. Genitourinary: No CVA tenderness. Musculoskeletal: Nontender with normal range of motion in all extremities. No cyanosis, or erythema of extremities. Neurologic:  Face is symmetric. Moving all extremities. No gross focal neurologic deficits . Skin: Skin is warm, dry.  No rash or ulcers Psychiatric: Mood  and behavior are normal   Labs on Admission: I have personally reviewed following labs and imaging studies  CBC: Recent Labs  Lab 04/02/20 2248  WBC 9.8  HGB 10.1*  HCT 30.3*  MCV 83.2  PLT 706   Basic Metabolic  Panel: Recent Labs  Lab 04/02/20 2248 04/03/20 0248  NA 139 143  K 3.9 4.1  CL 108 110  CO2 14* 19*  GLUCOSE 91 96  BUN 74* 73*  CREATININE 14.88* 15.20*  CALCIUM 4.1* 4.1*   GFR: Estimated Creatinine Clearance: 7.3 mL/min (A) (by C-G formula based on SCr of 15.2 mg/dL (H)). Liver Function Tests: Recent Labs  Lab 04/02/20 2248 04/03/20 0248  AST 51* 48*  ALT 69* 66*  ALKPHOS 58 62  BILITOT 0.5 0.8  PROT 7.0 7.6  ALBUMIN 3.8 3.9   Recent Labs  Lab 04/02/20 2248  LIPASE 90*   No results for input(s): AMMONIA in the last 168 hours. Coagulation Profile: No results for input(s):  INR, PROTIME in the last 168 hours. Cardiac Enzymes: No results for input(s): CKTOTAL, CKMB, CKMBINDEX, TROPONINI in the last 168 hours. BNP (last 3 results) No results for input(s): PROBNP in the last 8760 hours. HbA1C: No results for input(s): HGBA1C in the last 72 hours. CBG: No results for input(s): GLUCAP in the last 168 hours. Lipid Profile: No results for input(s): CHOL, HDL, LDLCALC, TRIG, CHOLHDL, LDLDIRECT in the last 72 hours. Thyroid Function Tests: No results for input(s): TSH, T4TOTAL, FREET4, T3FREE, THYROIDAB in the last 72 hours. Anemia Panel: No results for input(s): VITAMINB12, FOLATE, FERRITIN, TIBC, IRON, RETICCTPCT in the last 72 hours. Urine analysis:    Component Value Date/Time   COLORURINE YELLOW (A) 04/03/2020 0201   APPEARANCEUR HAZY (A) 04/03/2020 0201   LABSPEC 1.011 04/03/2020 0201   PHURINE 5.0 04/03/2020 0201   GLUCOSEU NEGATIVE 04/03/2020 0201   HGBUR MODERATE (A) 04/03/2020 0201   BILIRUBINUR NEGATIVE 04/03/2020 0201   KETONESUR NEGATIVE 04/03/2020 0201   PROTEINUR 100 (A) 04/03/2020 0201   NITRITE NEGATIVE 04/03/2020 0201   LEUKOCYTESUR NEGATIVE 04/03/2020 0201    Radiological Exams on Admission: CT ABDOMEN PELVIS WO CONTRAST  Result Date: 04/03/2020 CLINICAL DATA:  Nausea, vomiting, and shortness of breath EXAM: CT ABDOMEN AND PELVIS WITHOUT CONTRAST TECHNIQUE: Multidetector CT imaging of the abdomen and pelvis was performed following the standard protocol without IV contrast. COMPARISON:  None. FINDINGS: Lower chest: Motion artifact limits evaluation. Lung bases appear clear grossly. Hepatobiliary: No focal liver abnormality is seen. No gallstones, gallbladder wall thickening, or biliary dilatation. Pancreas: Unremarkable. No pancreatic ductal dilatation or surrounding inflammatory changes. Spleen: Normal in size without focal abnormality. Adrenals/Urinary Tract: Adrenal glands are unremarkable. Kidneys are normal, without renal calculi, focal  lesion, or hydronephrosis. Bladder wall is diffusely thickened, likely due to hypertrophy from outlet obstruction. Stomach/Bowel: Stomach, small bowel, and colon are not abnormally distended. Diffusely fluid-filled small bowel without wall thickening. Scattered stool in the colon. Colonic diverticula. No wall thickening or inflammatory changes. Vascular/Lymphatic: No significant vascular findings are present. No enlarged abdominal or pelvic lymph nodes. Reproductive: Prostate gland is enlarged, measuring 5.3 cm diameter. Other: No free air or free fluid in the abdomen. Abdominal wall musculature appears intact. Musculoskeletal: No acute or significant osseous findings. IMPRESSION: 1. Fluid-filled nondistended small bowel may indicate enteritis. No evidence of obstruction. 2. Enlarged prostate gland. 3. Diffuse bladder wall thickening likely due to hypertrophy from outlet obstruction. Electronically Signed   By: Lucienne Capers M.D.   On: 04/03/2020 04:00   DG Chest 2 View  Result Date: 04/02/2020 CLINICAL DATA:  Shortness of breath EXAM: CHEST - 2 VIEW COMPARISON:  None. FINDINGS: The heart size and mediastinal contours are within normal limits. Both lungs are clear. The visualized skeletal structures are  unremarkable. IMPRESSION: No active cardiopulmonary disease. Electronically Signed   By: Donavan Foil M.D.   On: 04/02/2020 23:11    EKG: Independently reviewed. Interpretation : NSR with prolonged QT of 440  Assessment/Plan 49 year old male with a history of autism and with no medical care in several years presenting with vomiting.  BP 222/136, with creatinine of 15, GFR less than 6   AKI (acute kidney injury) ([place]) Suspect underlying undiagnosed advanced age CKD secondary to undiagnosed/untreated hypertension Metabolic acidosis -IV hydration with bicarb -Nephrology consult -Avoid nephrotoxins    Hypertensive urgency -No prior history of HTN but BP 222/136 -Start amlodipine 5 p.o. if  tolerated with labetalol IV as needed until able to tolerate p.o.    Hypocalcemia -Likely related to kidney disease -Given calcium gluconate in the ER -Continue to monitor    Anemia -Suspect related to underlying chronic kidney disease -Anemia panel and stool for occult blood    Elevated LFTs -CT abdomen and pelvis with no liver abnormalities -Hepatitis panel    Elevated lipase -CT abdomen and pelvis unremarkable    Vomiting -Suspect uremic symptom -IV hydration and symptomatic care  Autism -Increase nursing assistance    BPH (benign prostatic hyperplasia) -Seen on CT -Consider initiating treatment    DVT prophylaxis: Heparin Code Status: full code  Family Communication:  none  Disposition Plan: Back to previous home environment Consults called: Nephrology Status:At the time of admission, it appears that the appropriate admission status for this patient is INPATIENT. This is judged to be reasonable and necessary in order to provide the required intensity of service to ensure the patient's safety given the presenting symptoms, physical exam findings, and initial radiographic and laboratory data in the context of their  Comorbid conditions.   Patient requires inpatient status due to high intensity of service, high risk for further deterioration and high frequency of surveillance required.   I certify that at the point of admission it is my clinical judgment that the patient will require inpatient hospital care spanning beyond [place] MD [facility]     04/03/2020, 4:49 AM     Pt seen examined and rounded on and case d/wnephroogist/ foley placed for bladder outlet obstruction.

## 2020-04-03 NOTE — ED Notes (Signed)
Pt given breakfast, father at bedside to assist.

## 2020-04-03 NOTE — ED Notes (Signed)
Date and time results received: 04/03/20 0815AM (use   Test: calcium Critical Value: 4.4 Name of Provider Notified: Florina Ou, MD

## 2020-04-03 NOTE — Plan of Care (Signed)

## 2020-04-04 ENCOUNTER — Inpatient Hospital Stay: Payer: Medicare Other

## 2020-04-04 DIAGNOSIS — N179 Acute kidney failure, unspecified: Secondary | ICD-10-CM | POA: Diagnosis not present

## 2020-04-04 LAB — CBC WITH DIFFERENTIAL/PLATELET
Abs Immature Granulocytes: 0.04 10*3/uL (ref 0.00–0.07)
Basophils Absolute: 0.1 10*3/uL (ref 0.0–0.1)
Basophils Relative: 1 %
Eosinophils Absolute: 0.1 10*3/uL (ref 0.0–0.5)
Eosinophils Relative: 1 %
HCT: 27.5 % — ABNORMAL LOW (ref 39.0–52.0)
Hemoglobin: 9.3 g/dL — ABNORMAL LOW (ref 13.0–17.0)
Immature Granulocytes: 0 %
Lymphocytes Relative: 9 %
Lymphs Abs: 0.9 10*3/uL (ref 0.7–4.0)
MCH: 27.7 pg (ref 26.0–34.0)
MCHC: 33.8 g/dL (ref 30.0–36.0)
MCV: 81.8 fL (ref 80.0–100.0)
Monocytes Absolute: 1.2 10*3/uL — ABNORMAL HIGH (ref 0.1–1.0)
Monocytes Relative: 11 %
Neutro Abs: 8.2 10*3/uL — ABNORMAL HIGH (ref 1.7–7.7)
Neutrophils Relative %: 78 %
Platelets: 163 10*3/uL (ref 150–400)
RBC: 3.36 MIL/uL — ABNORMAL LOW (ref 4.22–5.81)
RDW: 14.5 % (ref 11.5–15.5)
WBC: 10.6 10*3/uL — ABNORMAL HIGH (ref 4.0–10.5)
nRBC: 0 % (ref 0.0–0.2)

## 2020-04-04 LAB — COMPREHENSIVE METABOLIC PANEL
ALT: 36 U/L (ref 0–44)
AST: 22 U/L (ref 15–41)
Albumin: 3.4 g/dL — ABNORMAL LOW (ref 3.5–5.0)
Alkaline Phosphatase: 51 U/L (ref 38–126)
Anion gap: 18 — ABNORMAL HIGH (ref 5–15)
BUN: 72 mg/dL — ABNORMAL HIGH (ref 6–20)
CO2: 16 mmol/L — ABNORMAL LOW (ref 22–32)
Calcium: 4.6 mg/dL — CL (ref 8.9–10.3)
Chloride: 107 mmol/L (ref 98–111)
Creatinine, Ser: 14.15 mg/dL — ABNORMAL HIGH (ref 0.61–1.24)
GFR calc Af Amer: 4 mL/min — ABNORMAL LOW (ref >60)
GFR calc non Af Amer: 4 mL/min — ABNORMAL LOW (ref >60)
Glucose, Bld: 102 mg/dL — ABNORMAL HIGH (ref 70–99)
Potassium: 3.6 mmol/L (ref 3.5–5.1)
Sodium: 141 mmol/L (ref 135–145)
Total Bilirubin: 0.8 mg/dL (ref 0.3–1.2)
Total Protein: 6.4 g/dL — ABNORMAL LOW (ref 6.5–8.1)

## 2020-04-04 LAB — FERRITIN: Ferritin: 285 ng/mL (ref 24–336)

## 2020-04-04 LAB — PSA: Prostatic Specific Antigen: 26.65 ng/mL — ABNORMAL HIGH (ref 0.00–4.00)

## 2020-04-04 LAB — LIPASE, BLOOD: Lipase: 71 U/L — ABNORMAL HIGH (ref 11–51)

## 2020-04-04 LAB — IRON AND TIBC
Iron: 34 ug/dL — ABNORMAL LOW (ref 45–182)
Saturation Ratios: 18 % (ref 17.9–39.5)
TIBC: 188 ug/dL — ABNORMAL LOW (ref 250–450)
UIBC: 154 ug/dL

## 2020-04-04 LAB — CK: Total CK: 2334 U/L — ABNORMAL HIGH (ref 49–397)

## 2020-04-04 LAB — VITAMIN B12: Vitamin B-12: 265 pg/mL (ref 180–914)

## 2020-04-04 LAB — HEPATITIS B CORE ANTIBODY, IGM: Hep B C IgM: NONREACTIVE

## 2020-04-04 LAB — HEPATITIS B SURFACE ANTIBODY,QUALITATIVE: Hep B S Ab: NONREACTIVE

## 2020-04-04 LAB — MAGNESIUM: Magnesium: 1.5 mg/dL — ABNORMAL LOW (ref 1.7–2.4)

## 2020-04-04 LAB — FOLATE: Folate: 4.7 ng/mL — ABNORMAL LOW (ref >5.9)

## 2020-04-04 LAB — TRANSFERRIN: Transferrin: 141 mg/dL — ABNORMAL LOW (ref 180–329)

## 2020-04-04 MED ORDER — METOPROLOL TARTRATE 25 MG PO TABS
12.5000 mg | ORAL_TABLET | Freq: Two times a day (BID) | ORAL | Status: DC
  Administered 2020-04-04 – 2020-04-07 (×7): 12.5 mg via ORAL
  Filled 2020-04-04 (×7): qty 1

## 2020-04-04 MED ORDER — HYDRALAZINE HCL 10 MG PO TABS
10.0000 mg | ORAL_TABLET | Freq: Three times a day (TID) | ORAL | Status: DC
  Administered 2020-04-04 – 2020-04-10 (×17): 10 mg via ORAL
  Filled 2020-04-04 (×20): qty 1

## 2020-04-04 MED ORDER — DUTASTERIDE 0.5 MG PO CAPS
0.5000 mg | ORAL_CAPSULE | Freq: Every day | ORAL | Status: DC
  Administered 2020-04-04 – 2020-04-14 (×11): 0.5 mg via ORAL
  Filled 2020-04-04 (×11): qty 1

## 2020-04-04 MED ORDER — AMLODIPINE BESYLATE 10 MG PO TABS
10.0000 mg | ORAL_TABLET | Freq: Every day | ORAL | Status: DC
  Administered 2020-04-05 – 2020-04-14 (×9): 10 mg via ORAL
  Filled 2020-04-04 (×11): qty 1

## 2020-04-04 MED ORDER — LACTATED RINGERS IV SOLN: INTRAVENOUS | Status: DC

## 2020-04-04 MED ORDER — TAMSULOSIN HCL 0.4 MG PO CAPS
0.4000 mg | ORAL_CAPSULE | Freq: Every day | ORAL | Status: DC
  Administered 2020-04-04 – 2020-04-14 (×11): 0.4 mg via ORAL
  Filled 2020-04-04 (×12): qty 1

## 2020-04-04 NOTE — TOC Transition Note (Signed)
Transition of Care [facility]) - CM/SW Discharge Note   Patient Details  Name: Joseph Gilmore MRN: [id number] Date of Birth: 03/10/1971  Transition of Care [facility]) CM/SW Contact:  Harriet Masson, RN Phone [place] 04/04/2020, 6:25 PM   Clinical Narrative:     RN met with pt and his mother at bedside. Pt is special needs so all education and information discussed with the parent. Provided resources at several clinics to establish a primary provider for this pt due to his ongoing medical issues. Stress the importance of establishing a provider for this pt. Caregiver indicated her would follow through with one of resources provided. States she would discuss with the pt's father and make a discussion on which agency to consider for services. No addition needs or request at this time.  TOC will continue to follow.        Patient Goals and CMS Choice        Discharge Placement                       Discharge Plan and Services                                     Social Determinants of Health (SDOH) Interventions     Readmission Risk Interventions No flowsheet data found.

## 2020-04-04 NOTE — Progress Notes (Signed)
[facility]  ROUNDING NOTE   Subjective:   Patient with no complaints. Unclear how much patient comprehends.   Bicarb infusion at 119mL/hr.   Objective:  Vital signs in last 24 hours:  Temp:  [98.2 F (36.8 C)-99.5 F (37.5 C)] 98.5 F (36.9 C) (08/07 1142) Pulse Rate:  [74-94] 90 (08/07 1142) Resp:  [18-20] 18 (08/07 1142) BP: (107-188)/(67-120) 188/93 (08/07 1142) SpO2:  [91 %-100 %] 100 % (08/07 1142) Weight:  [109.6 kg] 109.6 kg (08/07 0404)  Weight change: -3.81 kg Filed Weights   04/02/20 2244 04/04/20 0404  Weight: 113.4 kg 109.6 kg    Intake/Output: I/O last 3 completed shifts: In: 1280 [I.V.:1280] Out: 1300 [Urine:1300]   Intake/Output this shift:  Total I/O In: -  Out: 500 [Urine:500]  Physical Exam: General: NAD,   Head: Normocephalic, atraumatic. Moist oral mucosal membranes  Eyes: Anicteric, PERRL  Neck: Supple, trachea midline  Lungs:  Clear to auscultation  Heart: Regular rate and rhythm  Abdomen:  Soft, nontender,   Extremities:  trace peripheral edema.  Neurologic: Nonfocal, moving all four extremities  Skin: No lesions  Foley +urine clear    Basic Metabolic Panel: Recent Labs  Lab 04/02/20 2248 04/02/20 2248 04/03/20 0248 04/03/20 0721 04/03/20 1809 04/04/20 0916  NA 139  --  143 140  --  141  K 3.9  --  4.1 4.0  --  3.6  CL 108  --  110 111  --  107  CO2 14*  --  19* 14*  --  16*  GLUCOSE 91  --  96 108*  --  102*  BUN 74*  --  73* 74*  --  72*  CREATININE 14.88*  --  15.20* 15.07*  --  14.15*  CALCIUM 4.1*   < > 4.1* 4.4*  --  4.6*  MG  --   --   --  1.5*  --  1.5*  PHOS  --   --   --   --  6.3*  --    < > = values in this interval not displayed.    Liver Function Tests: Recent Labs  Lab 04/02/20 2248 04/03/20 0248 04/04/20 0916  AST 51* 48* 22  ALT 69* 66* 36  ALKPHOS 58 62 51  BILITOT 0.5 0.8 0.8  PROT 7.0 7.6 6.4*  ALBUMIN 3.8 3.9 3.4*   Recent Labs  Lab 04/02/20 2248  LIPASE 90*   No  results for input(s): AMMONIA in the last 168 hours.  CBC: Recent Labs  Lab 04/02/20 2248 04/03/20 0721 04/04/20 0916  WBC 9.8 10.9* 10.6*  NEUTROABS  --   --  8.2*  HGB 10.1* 9.9* 9.3*  HCT 30.3* 29.4* 27.5*  MCV 83.2 83.5 81.8  PLT 180 189 163    Cardiac Enzymes: Recent Labs  Lab 04/03/20 0721  CKTOTAL 2,732*    BNP: Invalid input(s): POCBNP  CBG: No results for input(s): GLUCAP in the last 168 hours.  Microbiology: Results for orders placed or performed during the hospital encounter of 04/03/20  SARS Coronavirus 2 by RT PCR (hospital order, performed in [facility] hospital lab) Nasopharyngeal Nasopharyngeal Swab     Status: None   Collection Time: 04/03/20  3:06 AM   Specimen: Nasopharyngeal Swab  Result Value Ref Range Status   SARS Coronavirus 2 NEGATIVE NEGATIVE Final    Comment: (NOTE) SARS-CoV-2 target nucleic acids are NOT DETECTED.  The SARS-CoV-2 RNA is generally detectable in upper and lower respiratory specimens  during the acute phase of infection. The lowest concentration of SARS-CoV-2 viral copies this assay can detect is 250 copies / mL. A negative result does not preclude SARS-CoV-2 infection and should not be used as the sole basis for treatment or other patient management decisions.  A negative result may occur with improper specimen collection / handling, submission of specimen other than nasopharyngeal swab, presence of viral mutation(s) within the areas targeted by this assay, and inadequate number of viral copies (<250 copies / mL). A negative result must be combined with clinical observations, patient history, and epidemiological information.  Fact Sheet for Patients:   StrictlyIdeas.no  Fact Sheet for Healthcare Providers: BankingDealers.co.za  This test is not yet approved or  cleared by the [place] FDA and has been authorized for detection and/or diagnosis of SARS-CoV-2 by FDA  under an Emergency Use Authorization (EUA).  This EUA will remain in effect (meaning this test can be used) for the duration of the COVID-19 declaration under Section 564(b)(1) of the Act, 21 U.S.C. section 360bbb-3(b)(1), unless the authorization is terminated or revoked sooner.  Performed at [facility], [address] [place], [place] [address]     Coagulation Studies: No results for input(s): LABPROT, INR in the last 72 hours.  Urinalysis: Recent Labs    04/03/20 0201  COLORURINE YELLOW*  LABSPEC 1.011  PHURINE 5.0  GLUCOSEU NEGATIVE  HGBUR MODERATE*  BILIRUBINUR NEGATIVE  KETONESUR NEGATIVE  PROTEINUR 100*  NITRITE NEGATIVE  LEUKOCYTESUR NEGATIVE      Imaging: CT ABDOMEN PELVIS WO CONTRAST  Result Date: 04/03/2020 CLINICAL DATA:  Nausea, vomiting, and shortness of breath EXAM: CT ABDOMEN AND PELVIS WITHOUT CONTRAST TECHNIQUE: Multidetector CT imaging of the abdomen and pelvis was performed following the standard protocol without IV contrast. COMPARISON:  None. FINDINGS: Lower chest: Motion artifact limits evaluation. Lung bases appear clear grossly. Hepatobiliary: No focal liver abnormality is seen. No gallstones, gallbladder wall thickening, or biliary dilatation. Pancreas: Unremarkable. No pancreatic ductal dilatation or surrounding inflammatory changes. Spleen: Normal in size without focal abnormality. Adrenals/Urinary Tract: Adrenal glands are unremarkable. Kidneys are normal, without renal calculi, focal lesion, or hydronephrosis. Bladder wall is diffusely thickened, likely due to hypertrophy from outlet obstruction. Stomach/Bowel: Stomach, small bowel, and colon are not abnormally distended. Diffusely fluid-filled small bowel without wall thickening. Scattered stool in the colon. Colonic diverticula. No wall thickening or inflammatory changes. Vascular/Lymphatic: No significant vascular findings are present. No enlarged abdominal or pelvic lymph nodes.  Reproductive: Prostate gland is enlarged, measuring 5.3 cm diameter. Other: No free air or free fluid in the abdomen. Abdominal wall musculature appears intact. Musculoskeletal: No acute or significant osseous findings. IMPRESSION: 1. Fluid-filled nondistended small bowel may indicate enteritis. No evidence of obstruction. 2. Enlarged prostate gland. 3. Diffuse bladder wall thickening likely due to hypertrophy from outlet obstruction. Electronically Signed   By: Lucienne Capers M.D.   On: 04/03/2020 04:00   DG Chest 2 View  Result Date: 04/02/2020 CLINICAL DATA:  Shortness of breath EXAM: CHEST - 2 VIEW COMPARISON:  None. FINDINGS: The heart size and mediastinal contours are within normal limits. Both lungs are clear. The visualized skeletal structures are unremarkable. IMPRESSION: No active cardiopulmonary disease. Electronically Signed   By: Donavan Foil M.D.   On: 04/02/2020 23:11   US RENAL  Result Date: 04/04/2020 CLINICAL DATA:  Acute kidney injury. EXAM: RENAL / URINARY TRACT ULTRASOUND COMPLETE COMPARISON:  CT of the abdomen and pelvis on 04/03/2020 FINDINGS: Right Kidney: Renal measurements: 7.3  x 3.8 x 3.8 centimeters = volume: 56 mL. Renal parenchyma is hyperechoic. No focal renal mass. No hydronephrosis. Left Kidney: Renal measurements: 7.8 x 5.7 x 3.9 centimeters = volume: 92 mL. Renal parenchyma is hyperechoic. No focal renal mass or hydronephrosis. Bladder: Foley catheter decompresses the urinary bladder. Bladder wall thickening identified, with decompressed urinary bladder. Bladder wall measures approximately 1.1 centimeters. Other: None. IMPRESSION: 1. Hyperechoic renal parenchyma and small kidneys bilaterally, consistent with chronic renal disease. 2. No focal renal mass or hydronephrosis. 3. Foley catheter decompresses the urinary bladder. 4. Bladder wall thickening, a nonspecific finding. This can be seen in the setting of urinary tract infection and/or urinary bladder outlet obstruction.  Electronically Signed   By: Nolon Nations M.D.   On: 04/04/2020 10:10     Medications:   . calcium gluconate 1,000 mg (04/04/20 0534)  . lactated ringers    .  sodium bicarbonate  infusion 1000 mL 100 mL/hr at 04/03/20 2242   . amLODipine  5 mg Oral Daily  . heparin  5,000 Units Subcutaneous Q8H   acetaminophen **OR** acetaminophen, hydrALAZINE, labetalol, LORazepam  Assessment/ Plan:  Mr. Joseph Gilmore is a 49 y.o. black male with learning disability, autism, hypertension, obsessive compulsive disorder who is admitted to [facility] on 04/03/2020 for Hypocalcemia [E83.51] Enteritis [K52.9] Prolonged Q-T interval on ECG [R94.31] AKI (acute kidney injury) ([place]) [N17.9] Acute kidney injury ([place]) [N17.9]  1.  Acute kidney injury with metabolic acidosis and proteinuria. No known baseline creatinine.  Renal ultrasound consistent with chronic kidney disease. No obstruction.  No IV contrast exposure.  Acute renal failure seems to be secondary to obstructive uropathy.  Work up so far with negative for viral hepatitis, HIV negative.  Pending SPEP/UPEP, ANA, ANCA, anti-GBM, serum complements.  Nonoliguric urine output.    - IV fluids: sodium bicarbonate infusion.   2. Hypocalcemia: differential includes chronic kidney disease. Albumin 3.4. Patient no prescribed home medications.  Hyperphosphatemia also consistent with chronic kidney disease.  TSH within normal limits, HIV negative Pending Vitamin D level.  - IV calcium gluconate - replace magnesium  3. Rhabdomyolysis:  - bicarb infusion  4. Anemia with renal failure: hemoglobin 9.3. Pending SPEP/UPEP, iron studies, vitamin B12 and folate.  No acute indication for ESA.    LOS: 1 Joseph Gilmore 8/7/20211:10 PM

## 2020-04-04 NOTE — Plan of Care (Signed)
  Problem: Education: Goal: Knowledge of General Education information will improve Description Including pain rating scale, medication(s)/side effects and non-pharmacologic comfort measures Outcome: Progressing   

## 2020-04-04 NOTE — Progress Notes (Signed)
PROGRESS NOTE    Joseph Gilmore  VOZ:[id number] DOB: 11-Feb-1971 DOA: 04/03/2020 PCP: [facility]   Brief Narrative: Patient is a 49 y.o. male with a PMHx of autism, hypertension, and OC, who was admitted to [facility] on 04/03/2020 for evaluation of vomiting.  It appears that the patient has not had any primary care in a number of years.Ct Scan in er showed bladder outlet obstruction and foley placed . Pt has autism And has limitation with understanding with care. Pt has no diagnosed h/o htn and has not received pc.Admission cpk is elevated and no exertion/ trauma /fall reported. We will recheck and cont with hydration and further evaluate if level is increasing. Nephrology is managing renal failure and we appreciate consult.   Assessment & Plan:   Principal Problem:   AKI (acute kidney injury) ([place]) Active Problems:   Metabolic acidosis   Anemia   Elevated LFTs   Elevated lipase   Hypertensive urgency   Vomiting   BPH (benign prostatic hyperplasia)  AKI: -suspect underlying chronic renal disease related to possible underlying htn in addition to postobstructive etiology related to his bph, in addition to rhabdomyolysis with repeat cpk pending today.  -Avoid nephrotoxic meds and contrast studies / renally dose meds. -Creatinine today is  14.15  Lab 04/02/20 2248 04/03/20 0248 04/03/20 0721 04/03/20 1809 04/04/20 0916   CREATININE 14.88* 15.20* 15.07*  --  42.59*   Metabolic acidosis- -Due to AKI. -cont bicarb infusion.  Anemia: -anemia panel /-suspect acd -Normal RDW/ MCV. -stool occult /ppi.  Elevated lft: -due to htn. -suspect underlying Fatty liver. -improving.   Elevated lipase- -repeat pending today.  -nonspecific finding in his case, eval based on repeat level today.  -CT abd negative.   Hypertensive urgency/ Essential htn: Pt is on PRN iv regimen of hydralazine and labetalol we will change to po hydralazine.  And  Lopressor 12.5 mg po  bid.  BPH: -Indwelling foley in place and urology consult upon discharge or as needed inpatient.  -Will start dual therapy with Avodart and Tamsulosin. -psa pending.   DVT prophylaxis: Heparin. Code Status: Full Family Communication: Dad at bedside yesterday.  Disposition Plan: TBD.  Consultants:   Nephrology-Dr.Kolluru.  Procedures:  None  Subjective: 04/04/20: Pt seen today he is alert,awake and guarded as he does not like to be touched but does let me examine me , he recalls me from yesterday. AM BP is high as below.  Objective: Vitals:   04/04/20 0500 04/04/20 0725 04/04/20 1142 04/04/20 1447  BP: (!) 173/104 (!) 167/95 (!) 188/93 (!) 176/99  Pulse:  87 90   Resp:  18 18   Temp:  98.2 F (36.8 C) 98.5 F (36.9 C)   TempSrc:  Oral Oral   SpO2: 100% 100% 100%   Weight:      Height:        Intake/Output Summary (Last 24 hours) at 04/04/2020 1455 Last data filed at 04/04/2020 1013 Gross per 24 hour  Intake 1280 ml  Output 1800 ml  Net -520 ml   Filed Weights   04/02/20 2244 04/04/20 0404  Weight: 113.4 kg 109.6 kg    Examination: Blood pressure (!) 176/99, pulse 90, temperature 98.5 F (36.9 C), temperature source Oral, resp. rate 18, height 5\' 8"  (1.727 m), weight 109.6 kg, SpO2 100 %.  General exam: Appears calm and comfortable  Respiratory system: Clear to auscultation. Respiratory effort normal. Cardiovascular system: S1 & S2 heard, RRR. No JVD, murmurs, rubs, gallops or clicks.  No pedal edema. Gastrointestinal system: Abdomen is nondistended, soft and nontender. No organomegaly or masses felt. Normal bowel sounds heard. Central nervous system: Alert and oriented. No focal neurological deficits. Extremities: Symmetric 5 x 5 power. Skin: No rashes, lesions or ulcers Psychiatry: Cooperative/  Mood & affect appropriate.   Data Reviewed: I have personally reviewed following labs and imaging studies  CBC: Recent Labs  Lab 04/02/20 2248 04/03/20 0721  04/04/20 0916  WBC 9.8 10.9* 10.6*  NEUTROABS  --   --  8.2*  HGB 10.1* 9.9* 9.3*  HCT 30.3* 29.4* 27.5*  MCV 83.2 83.5 81.8  PLT 180 189 119   Basic Metabolic Panel: Recent Labs  Lab 04/02/20 2248 04/03/20 0248 04/03/20 0721 04/03/20 1809 04/04/20 0916  NA 139 143 140  --  141  K 3.9 4.1 4.0  --  3.6  CL 108 110 111  --  107  CO2 14* 19* 14*  --  16*  GLUCOSE 91 96 108*  --  102*  BUN 74* 73* 74*  --  72*  CREATININE 14.88* 15.20* 15.07*  --  14.15*  CALCIUM 4.1* 4.1* 4.4*  --  4.6*  MG  --   --  1.5*  --  1.5*  PHOS  --   --   --  6.3*  --    GFR: Estimated Creatinine Clearance: 7.7 mL/min (A) (by C-G formula based on SCr of 14.15 mg/dL (H)). Liver Function Tests: Recent Labs  Lab 04/02/20 2248 04/03/20 0248 04/04/20 0916  AST 51* 48* 22  ALT 69* 66* 36  ALKPHOS 58 62 51  BILITOT 0.5 0.8 0.8  PROT 7.0 7.6 6.4*  ALBUMIN 3.8 3.9 3.4*   Recent Labs  Lab 04/02/20 2248 04/04/20 1357  LIPASE 90* 71*   No results for input(s): AMMONIA in the last 168 hours. Coagulation Profile: No results for input(s): INR, PROTIME in the last 168 hours. Cardiac Enzymes: Recent Labs  Lab 04/03/20 0721 04/04/20 1357  CKTOTAL 2,732* 2,334*    Recent Labs    04/03/20 1809  TSH 0.509  FREET4 1.09   Anemia Panel: No results for input(s): VITAMINB12, FOLATE, FERRITIN, TIBC, IRON, RETICCTPCT in the last 72 hours. Sepsis Labs: No results for input(s): PROCALCITON, LATICACIDVEN in the last 168 hours.  Recent Results (from the past 240 hour(s))  SARS Coronavirus 2 by RT PCR (hospital order, performed in [facility] hospital lab) Nasopharyngeal Nasopharyngeal Swab     Status: None   Collection Time: 04/03/20  3:06 AM   Specimen: Nasopharyngeal Swab  Result Value Ref Range Status   SARS Coronavirus 2 NEGATIVE NEGATIVE Final    Comment: (NOTE) SARS-CoV-2 target nucleic acids are NOT DETECTED.  The SARS-CoV-2 RNA is generally detectable in upper and lower respiratory  specimens during the acute phase of infection. The lowest concentration of SARS-CoV-2 viral copies this assay can detect is 250 copies / mL. A negative result does not preclude SARS-CoV-2 infection and should not be used as the sole basis for treatment or other patient management decisions.  A negative result may occur with improper specimen collection / handling, submission of specimen other than nasopharyngeal swab, presence of viral mutation(s) within the areas targeted by this assay, and inadequate number of viral copies (<250 copies / mL). A negative result must be combined with clinical observations, patient history, and epidemiological information.  Fact Sheet for Patients:   StrictlyIdeas.no  Fact Sheet for Healthcare Providers: BankingDealers.co.za  This test is not yet approved or  cleared by the [place] and has been authorized for detection and/or diagnosis of SARS-CoV-2 by FDA under an Emergency Use Authorization (EUA).  This EUA will remain in effect (meaning this test can be used) for the duration of the COVID-19 declaration under Section 564(b)(1) of the Act, 21 U.S.C. section 360bbb-3(b)(1), unless the authorization is terminated or revoked sooner.  Performed at [facility], [address] [place], [place] [address]      Radiology Studies: CT ABDOMEN PELVIS WO CONTRAST Result Date: 04/03/2020 CLINICAL DATA:  Nausea, vomiting, and shortness of breath EXAM: CT ABDOMEN AND PELVIS WITHOUT CONTRAST TECHNIQUE: Multidetector CT imaging of the abdomen and pelvis was performed following the standard protocol without IV contrast. COMPARISON:  None. FINDINGS: Lower chest: Motion artifact limits evaluation. Lung bases appear clear grossly. Hepatobiliary: No focal liver abnormality is seen. No gallstones, gallbladder wall thickening, or biliary dilatation. Pancreas: Unremarkable. No pancreatic ductal dilatation or  surrounding inflammatory changes. Spleen: Normal in size without focal abnormality. Adrenals/Urinary Tract: Adrenal glands are unremarkable. Kidneys are normal, without renal calculi, focal lesion, or hydronephrosis. Bladder wall is diffusely thickened, likely due to hypertrophy from outlet obstruction. Stomach/Bowel: Stomach, small bowel, and colon are not abnormally distended. Diffusely fluid-filled small bowel without wall thickening. Scattered stool in the colon. Colonic diverticula. No wall thickening or inflammatory changes. Vascular/Lymphatic: No significant vascular findings are present. No enlarged abdominal or pelvic lymph nodes. Reproductive: Prostate gland is enlarged, measuring 5.3 cm diameter. Other: No free air or free fluid in the abdomen. Abdominal wall musculature appears intact. Musculoskeletal: No acute or significant osseous findings.  IMPRESSION:  1. Fluid-filled nondistended small bowel may indicate enteritis. No evidence of obstruction. 2. Enlarged prostate gland.  3. Diffuse bladder wall thickening likely due to hypertrophy from outlet obstruction.  Electronically Signed   By: Lucienne Capers M.D.   On: 04/03/2020 04:00   DG Chest 2 View Result Date: 04/02/2020 CLINICAL DATA:  Shortness of breath EXAM: CHEST - 2 VIEW COMPARISON:  None. FINDINGS: The heart size and mediastinal contours are within normal limits. Both lungs are clear. The visualized skeletal structures are unremarkable. IMPRESSION: No active cardiopulmonary disease. Electronically Signed   By: Donavan Foil M.D.   On: 04/02/2020 23:11   US RENAL Result Date: 04/04/2020 CLINICAL DATA:  Acute kidney injury. EXAM: RENAL / URINARY TRACT ULTRASOUND COMPLETE COMPARISON:  CT of the abdomen and pelvis on 04/03/2020 FINDINGS: Right Kidney: Renal measurements: 7.3 x 3.8 x 3.8 centimeters = volume: 56 mL. Renal parenchyma is hyperechoic. No focal renal mass. No hydronephrosis. Left Kidney: Renal measurements: 7.8 x 5.7 x 3.9  centimeters = volume: 92 mL. Renal parenchyma is hyperechoic. No focal renal mass or hydronephrosis. Bladder: Foley catheter decompresses the urinary bladder. Bladder wall thickening identified, with decompressed urinary bladder. Bladder wall measures approximately 1.1 centimeters. Other: None.  IMPRESSION:  1. Hyperechoic renal parenchyma and small kidneys bilaterally, consistent with chronic renal disease.  2. No focal renal mass or hydronephrosis.  3. Foley catheter decompresses the urinary bladder.  4. Bladder wall thickening, a nonspecific finding. This can be seen in the setting of urinary tract infection and/or urinary bladder outlet obstruction.   Electronically Signed   By: Nolon Nations M.D.   On: 04/04/2020 10:10    Scheduled Meds: . amLODipine  10 mg Oral Daily  . dutasteride  0.5 mg Oral Daily  . heparin  5,000 Units Subcutaneous Q8H  . hydrALAZINE  10 mg Oral Q8H  . metoprolol tartrate  12.5 mg Oral BID  . tamsulosin  0.4 mg Oral Daily   Continuous Infusions: . calcium gluconate 1,000 mg (04/04/20 0534)  .  sodium bicarbonate  infusion 1000 mL 100 mL/hr at 04/03/20 2242    Para Skeans, MD [facility] Pager [phone] If 7PM-7AM, please contact night-coverage [email or web address] Password [email or web address] 04/04/2020, 2:55 PM

## 2020-04-05 DIAGNOSIS — N179 Acute kidney failure, unspecified: Secondary | ICD-10-CM | POA: Diagnosis not present

## 2020-04-05 LAB — CBC WITH DIFFERENTIAL/PLATELET
Abs Immature Granulocytes: 0.04 10*3/uL (ref 0.00–0.07)
Basophils Absolute: 0 10*3/uL (ref 0.0–0.1)
Basophils Relative: 0 %
Eosinophils Absolute: 0.2 10*3/uL (ref 0.0–0.5)
Eosinophils Relative: 2 %
HCT: 25.7 % — ABNORMAL LOW (ref 39.0–52.0)
Hemoglobin: 8.7 g/dL — ABNORMAL LOW (ref 13.0–17.0)
Immature Granulocytes: 0 %
Lymphocytes Relative: 15 %
Lymphs Abs: 1.4 10*3/uL (ref 0.7–4.0)
MCH: 28 pg (ref 26.0–34.0)
MCHC: 33.9 g/dL (ref 30.0–36.0)
MCV: 82.6 fL (ref 80.0–100.0)
Monocytes Absolute: 1.2 10*3/uL — ABNORMAL HIGH (ref 0.1–1.0)
Monocytes Relative: 12 %
Neutro Abs: 6.8 10*3/uL (ref 1.7–7.7)
Neutrophils Relative %: 71 %
Platelets: 162 10*3/uL (ref 150–400)
RBC: 3.11 MIL/uL — ABNORMAL LOW (ref 4.22–5.81)
RDW: 14.2 % (ref 11.5–15.5)
WBC: 9.6 10*3/uL (ref 4.0–10.5)
nRBC: 0 % (ref 0.0–0.2)

## 2020-04-05 LAB — RENAL FUNCTION PANEL
Albumin: 3.1 g/dL — ABNORMAL LOW (ref 3.5–5.0)
Anion gap: 16 — ABNORMAL HIGH (ref 5–15)
BUN: 69 mg/dL — ABNORMAL HIGH (ref 6–20)
CO2: 21 mmol/L — ABNORMAL LOW (ref 22–32)
Calcium: 4.8 mg/dL — CL (ref 8.9–10.3)
Chloride: 102 mmol/L (ref 98–111)
Creatinine, Ser: 14.11 mg/dL — ABNORMAL HIGH (ref 0.61–1.24)
GFR calc Af Amer: 4 mL/min — ABNORMAL LOW (ref >60)
GFR calc non Af Amer: 4 mL/min — ABNORMAL LOW (ref >60)
Glucose, Bld: 102 mg/dL — ABNORMAL HIGH (ref 70–99)
Phosphorus: 7 mg/dL — ABNORMAL HIGH (ref 2.5–4.6)
Potassium: 3.2 mmol/L — ABNORMAL LOW (ref 3.5–5.1)
Sodium: 139 mmol/L (ref 135–145)

## 2020-04-05 LAB — FIBRIN DERIVATIVES D-DIMER (ARMC ONLY): Fibrin derivatives D-dimer (ARMC): 1112.21 ng/mL (FEU) — ABNORMAL HIGH (ref 0.00–499.00)

## 2020-04-05 MED ORDER — POTASSIUM CHLORIDE CRYS ER 20 MEQ PO TBCR
40.0000 meq | EXTENDED_RELEASE_TABLET | Freq: Once | ORAL | Status: AC
  Administered 2020-04-05: 40 meq via ORAL
  Filled 2020-04-05: qty 2

## 2020-04-05 MED ORDER — SODIUM CHLORIDE 0.9% FLUSH
10.0000 mL | Freq: Two times a day (BID) | INTRAVENOUS | Status: DC
  Administered 2020-04-05 – 2020-04-14 (×17): 10 mL via INTRAVENOUS

## 2020-04-05 MED ORDER — RENA-VITE PO TABS
1.0000 | ORAL_TABLET | Freq: Every day | ORAL | Status: DC
  Administered 2020-04-05 – 2020-04-14 (×9): 1 via ORAL
  Filled 2020-04-05 (×10): qty 1

## 2020-04-05 MED ORDER — CALCIUM ACETATE (PHOS BINDER) 667 MG PO CAPS
1334.0000 mg | ORAL_CAPSULE | Freq: Three times a day (TID) | ORAL | Status: DC
  Administered 2020-04-05 – 2020-04-14 (×23): 1334 mg via ORAL
  Filled 2020-04-05 (×28): qty 2

## 2020-04-05 NOTE — Progress Notes (Signed)
PT Cancellation Note  Patient Details Name: Joseph Gilmore MRN: [id number] DOB: [birth year]   Cancelled Treatment:    Reason Eval/Treat Not Completed: Medical issues which prohibited therapy. Calcium 4.8. PT will check back and see patient when medically able.Sherre Poot, Sherryl Barters, PT DPT 04/05/2020, 1:13 PM

## 2020-04-05 NOTE — Plan of Care (Signed)
  Problem: Clinical Measurements: Goal: Cardiovascular complication will be avoided Outcome: Progressing Note: Getting PRN and scheduled medications for HTN and heart rate   Problem: Activity: Goal: Risk for activity intolerance will decrease Outcome: Progressing   Problem: Elimination: Goal: Will not experience complications related to bowel motility Outcome: Progressing Goal: Will not experience complications related to urinary retention Outcome: Progressing Note: Pt has a foley in place

## 2020-04-05 NOTE — Progress Notes (Signed)
PROGRESS NOTE    DOVER HEAD  TZG:[id number] DOB: [birth year] DOA: 04/03/2020 PCP: [facility]   Brief Narrative: Patient is a 49 y.o. male with a PMHx of autism, hypertension, and OC, who was admitted to [facility] on 04/03/2020 for evaluation of vomiting.  It appears that the patient has not had any primary care in a number of years.Ct Scan in er showed bladder outlet obstruction and foley placed . Pt has autism And has limitation with understanding with care. Pt has no diagnosed h/o htn and has not received pc.Admission cpk is elevated and no exertion/ trauma /fall reported. We will recheck and cont with hydration and further evaluate if level is increasing. Nephrology is managing renal failure and we appreciate consult.   Assessment & Plan:   Principal Problem:   AKI (acute kidney injury) ([place]) Active Problems:   Metabolic acidosis   Anemia   Elevated LFTs   Elevated lipase   Hypertensive urgency   Vomiting   BPH (benign prostatic hyperplasia)  AKI: -suspect underlying chronic renal disease related to possible underlying htn in addition to postobstructive etiology related to his bph, in addition to rhabdomyolysis with repeat cpk pending today.  -Avoid nephrotoxic meds and contrast studies / renally dose meds. -Creatinine today is  75.91  Metabolic acidosis- -Due to AKI. -cont bicarb infusion.  Anemia: -anemia panel /-suspect acd -Normal RDW/ MCV. -stool occult /ppi.  Elevated lft: -due to htn. -suspect underlying Fatty liver. -improving.   Elevated lipase- -repeat pending today.  -nonspecific finding in his case, eval based on repeat level today.  -CT abd negative.   Hypertensive urgency/ Essential htn: Pt is on PRN iv regimen of hydralazine and labetalol we will change to po hydralazine.  And  Lopressor 12.5 mg po bid.  BPH: -Indwelling foley in place and urology consult upon discharge or as needed inpatient.  -Will start dual therapy with Avodart and  Tamsulosin. -psa pending.   DVT prophylaxis: Heparin. Code Status: Full Family Communication: Dad at bedside yesterday.  Disposition Plan: TBD.  Consultants:   Nephrology-Dr.Kolluru.    Subjective: 04/04/20: Pt seen today he is alert,awake and guarded as he does not like to be touched but does let me examine me , he recalls me from yesterday. AM BP is high as below.   8/8: Pt seen today and clinically he is stable/ received report form nurse about low grade fever about an hour ago and cx collected.   Objective: Vitals:   04/05/20 0504 04/05/20 0724 04/05/20 1215 04/05/20 1613  BP: (!) 160/89 (!) 166/86 (!) 146/76 (!) 133/102  Pulse: 87 92 94 99  Resp: 16 18 18 18   Temp: 99 F (37.2 C) 98.9 F (37.2 C)  (!) 100.5 F (38.1 C)  TempSrc: Oral Oral  Oral  SpO2: 100% 99% 98% 98%  Weight: 109.3 kg     Height:        Intake/Output Summary (Last 24 hours) at 04/05/2020 1739 Last data filed at 04/05/2020 1300 Gross per 24 hour  Intake 1460 ml  Output 1550 ml  Net -90 ml   Filed Weights   04/02/20 2244 04/04/20 0404 04/05/20 0504  Weight: 113.4 kg 109.6 kg 109.3 kg    Examination: Blood pressure (!) 133/102, pulse 99, temperature (!) 100.5 F (38.1 C), temperature source Oral, resp. rate 18, height 5\' 8"  (1.727 m), weight 109.3 kg, SpO2 98 %.  General exam: Appears calm and comfortable  Respiratory system: Clear to auscultation. Respiratory effort normal. Cardiovascular system:  S1 & S2 heard, RRR. No JVD, murmurs, rubs, gallops or clicks. No pedal edema. Gastrointestinal system: Abdomen is nondistended, soft and nontender. No organomegaly or masses felt. Normal bowel sounds heard. Central nervous system: Alert and oriented. No focal neurological deficits. Extremities: Symmetric 5 x 5 power. Skin: No rashes, lesions or ulcers Psychiatry: Cooperative/  Mood & affect appropriate.   Data Reviewed: I have personally reviewed following labs and imaging studies  CBC: Recent  Labs  Lab 04/02/20 2248 04/03/20 0721 04/04/20 0916 04/05/20 0825  WBC 9.8 10.9* 10.6* 9.6  NEUTROABS  --   --  8.2* 6.8  HGB 10.1* 9.9* 9.3* 8.7*  HCT 30.3* 29.4* 27.5* 25.7*  MCV 83.2 83.5 81.8 82.6  PLT 180 189 163 951   Basic Metabolic Panel: Recent Labs  Lab 04/02/20 2248 04/03/20 0248 04/03/20 0721 04/03/20 1809 04/04/20 0916  NA 139 143 140  --  141  K 3.9 4.1 4.0  --  3.6  CL 108 110 111  --  107  CO2 14* 19* 14*  --  16*  GLUCOSE 91 96 108*  --  102*  BUN 74* 73* 74*  --  72*  CREATININE 14.88* 15.20* 15.07*  --  14.15*  CALCIUM 4.1* 4.1* 4.4*  --  4.6*  MG  --   --  1.5*  --  1.5*  PHOS  --   --   --  6.3*  --    GFR: Estimated Creatinine Clearance: 7.7 mL/min (A) (by C-G formula based on SCr of 14.11 mg/dL (H)). Liver Function Tests: Recent Labs  Lab 04/02/20 2248 04/03/20 0248 04/04/20 0916 04/05/20 0825  AST 51* 48* 22  --   ALT 69* 66* 36  --   ALKPHOS 58 62 51  --   BILITOT 0.5 0.8 0.8  --   PROT 7.0 7.6 6.4*  --   ALBUMIN 3.8 3.9 3.4* 3.1*   Recent Labs  Lab 04/02/20 2248 04/04/20 1357  LIPASE 90* 71*   No results for input(s): AMMONIA in the last 168 hours. Coagulation Profile: No results for input(s): INR, PROTIME in the last 168 hours. Cardiac Enzymes: Recent Labs  Lab 04/03/20 0721 04/04/20 1357  CKTOTAL 2,732* 2,334*    Recent Labs    04/03/20 1809  TSH 0.509  FREET4 1.09   Anemia Panel: Recent Labs    04/04/20 1357  VITAMINB12 265  FOLATE 4.7*  FERRITIN 285  TIBC 188*  IRON 34*   Sepsis Labs: No results for input(s): PROCALCITON, LATICACIDVEN in the last 168 hours.  Recent Results (from the past 240 hour(s))  SARS Coronavirus 2 by RT PCR (hospital order, performed in [facility] hospital lab) Nasopharyngeal Nasopharyngeal Swab     Status: None   Collection Time: 04/03/20  3:06 AM   Specimen: Nasopharyngeal Swab  Result Value Ref Range Status   SARS Coronavirus 2 NEGATIVE NEGATIVE Final    Comment:  (NOTE) SARS-CoV-2 target nucleic acids are NOT DETECTED.  The SARS-CoV-2 RNA is generally detectable in upper and lower respiratory specimens during the acute phase of infection. The lowest concentration of SARS-CoV-2 viral copies this assay can detect is 250 copies / mL. A negative result does not preclude SARS-CoV-2 infection and should not be used as the sole basis for treatment or other patient management decisions.  A negative result may occur with improper specimen collection / handling, submission of specimen other than nasopharyngeal swab, presence of viral mutation(s) within the areas targeted by this assay, and  inadequate number of viral copies (<250 copies / mL). A negative result must be combined with clinical observations, patient history, and epidemiological information.  Fact Sheet for Patients:   StrictlyIdeas.no  Fact Sheet for Healthcare Providers: BankingDealers.co.za  This test is not yet approved or  cleared by the [place] FDA and has been authorized for detection and/or diagnosis of SARS-CoV-2 by FDA under an Emergency Use Authorization (EUA).  This EUA will remain in effect (meaning this test can be used) for the duration of the COVID-19 declaration under Section 564(b)(1) of the Act, 21 U.S.C. section 360bbb-3(b)(1), unless the authorization is terminated or revoked sooner.  Performed at [facility], [address] [place], [place] [address]      Radiology Studies: CT ABDOMEN PELVIS WO CONTRAST Result Date: 04/03/2020 CLINICAL DATA:  Nausea, vomiting, and shortness of breath EXAM: CT ABDOMEN AND PELVIS WITHOUT CONTRAST TECHNIQUE: Multidetector CT imaging of the abdomen and pelvis was performed following the standard protocol without IV contrast. COMPARISON:  None. FINDINGS: Lower chest: Motion artifact limits evaluation. Lung bases appear clear grossly. Hepatobiliary: No focal liver abnormality  is seen. No gallstones, gallbladder wall thickening, or biliary dilatation. Pancreas: Unremarkable. No pancreatic ductal dilatation or surrounding inflammatory changes. Spleen: Normal in size without focal abnormality. Adrenals/Urinary Tract: Adrenal glands are unremarkable. Kidneys are normal, without renal calculi, focal lesion, or hydronephrosis. Bladder wall is diffusely thickened, likely due to hypertrophy from outlet obstruction. Stomach/Bowel: Stomach, small bowel, and colon are not abnormally distended. Diffusely fluid-filled small bowel without wall thickening. Scattered stool in the colon. Colonic diverticula. No wall thickening or inflammatory changes. Vascular/Lymphatic: No significant vascular findings are present. No enlarged abdominal or pelvic lymph nodes. Reproductive: Prostate gland is enlarged, measuring 5.3 cm diameter. Other: No free air or free fluid in the abdomen. Abdominal wall musculature appears intact. Musculoskeletal: No acute or significant osseous findings.  IMPRESSION:  1. Fluid-filled nondistended small bowel may indicate enteritis. No evidence of obstruction. 2. Enlarged prostate gland.  3. Diffuse bladder wall thickening likely due to hypertrophy from outlet obstruction.  Electronically Signed   By: Lucienne Capers M.D.   On: 04/03/2020 04:00   DG Chest 2 View Result Date: 04/02/2020 CLINICAL DATA:  Shortness of breath EXAM: CHEST - 2 VIEW COMPARISON:  None. FINDINGS: The heart size and mediastinal contours are within normal limits. Both lungs are clear. The visualized skeletal structures are unremarkable. IMPRESSION: No active cardiopulmonary disease. Electronically Signed   By: Donavan Foil M.D.   On: 04/02/2020 23:11   US RENAL Result Date: 04/04/2020 CLINICAL DATA:  Acute kidney injury. EXAM: RENAL / URINARY TRACT ULTRASOUND COMPLETE COMPARISON:  CT of the abdomen and pelvis on 04/03/2020 FINDINGS: Right Kidney: Renal measurements: 7.3 x 3.8 x 3.8 centimeters =  volume: 56 mL. Renal parenchyma is hyperechoic. No focal renal mass. No hydronephrosis. Left Kidney: Renal measurements: 7.8 x 5.7 x 3.9 centimeters = volume: 92 mL. Renal parenchyma is hyperechoic. No focal renal mass or hydronephrosis. Bladder: Foley catheter decompresses the urinary bladder. Bladder wall thickening identified, with decompressed urinary bladder. Bladder wall measures approximately 1.1 centimeters. Other: None.  IMPRESSION:  1. Hyperechoic renal parenchyma and small kidneys bilaterally, consistent with chronic renal disease.  2. No focal renal mass or hydronephrosis.  3. Foley catheter decompresses the urinary bladder.  4. Bladder wall thickening, a nonspecific finding. This can be seen in the setting of urinary tract infection and/or urinary bladder outlet obstruction.   Electronically Signed   By: Benjamine Mola  Owens Shark M.D.   On: 04/04/2020 10:10    Scheduled Meds:  amLODipine  10 mg Oral Daily   calcium acetate  1,334 mg Oral TID WC   dutasteride  0.5 mg Oral Daily   heparin  5,000 Units Subcutaneous Q8H   hydrALAZINE  10 mg Oral Q8H   metoprolol tartrate  12.5 mg Oral BID   multivitamin  1 tablet Oral QHS   tamsulosin  0.4 mg Oral Daily   Continuous Infusions:   sodium bicarbonate  infusion 1000 mL 100 mL/hr at 04/05/20 0349    Para Skeans, MD [facility] Pager [phone] If 7PM-7AM, please contact night-coverage [email or web address] Password [facility] 04/05/2020, 5:39 PM

## 2020-04-05 NOTE — Progress Notes (Signed)
[facility]  ROUNDING NOTE   Subjective:   Father at bedside who give history.   UOP 1763mL Bicarb infusion at 18mL/hr.   Creatinine 14.11 (14.15)  Calcium 4.8 (4.6)  Objective:  Vital signs in last 24 hours:  Temp:  [98.9 F (37.2 C)-100.8 F (38.2 C)] 98.9 F (37.2 C) (08/08 0724) Pulse Rate:  [87-96] 94 (08/08 1215) Resp:  [16-19] 18 (08/08 1215) BP: (146-176)/(76-101) 146/76 (08/08 1215) SpO2:  [98 %-100 %] 98 % (08/08 1215) Weight:  [109.3 kg] 109.3 kg (08/08 0504)  Weight change: -0.318 kg Filed Weights   04/02/20 2244 04/04/20 0404 04/05/20 0504  Weight: 113.4 kg 109.6 kg 109.3 kg    Intake/Output: I/O last 3 completed shifts: In: 2660 [P.O.:660; I.V.:2000] Out: 2650 [Urine:2650]   Intake/Output this shift:  No intake/output data recorded.  Physical Exam: General: NAD,   Head: Normocephalic, atraumatic. Moist oral mucosal membranes  Eyes: Anicteric, PERRL  Neck: Supple, trachea midline  Lungs:  Clear to auscultation  Heart: Regular rate and rhythm  Abdomen:  Soft, nontender,   Extremities:  trace peripheral edema.  Neurologic: +learning disability, unable to give history  Skin: No lesions  GU Foley catheter with urine clear    Basic Metabolic Panel: Recent Labs  Lab 04/02/20 2248 04/02/20 2248 04/03/20 0248 04/03/20 0248 04/03/20 0721 04/03/20 1809 04/04/20 0916 04/05/20 0825  NA 139  --  143  --  140  --  141 139  K 3.9  --  4.1  --  4.0  --  3.6 3.2*  CL 108  --  110  --  111  --  107 102  CO2 14*  --  19*  --  14*  --  16* 21*  GLUCOSE 91  --  96  --  108*  --  102* 102*  BUN 74*  --  73*  --  74*  --  72* 69*  CREATININE 14.88*  --  15.20*  --  15.07*  --  14.15* 14.11*  CALCIUM 4.1*   < > 4.1*   < > 4.4*  --  4.6* 4.8*  MG  --   --   --   --  1.5*  --  1.5*  --   PHOS  --   --   --   --   --  6.3*  --  7.0*   < > = values in this interval not displayed.    Liver Function Tests: Recent Labs  Lab 04/02/20 2248  04/03/20 0248 04/04/20 0916 04/05/20 0825  AST 51* 48* 22  --   ALT 69* 66* 36  --   ALKPHOS 58 62 51  --   BILITOT 0.5 0.8 0.8  --   PROT 7.0 7.6 6.4*  --   ALBUMIN 3.8 3.9 3.4* 3.1*   Recent Labs  Lab 04/02/20 2248 04/04/20 1357  LIPASE 90* 71*   No results for input(s): AMMONIA in the last 168 hours.  CBC: Recent Labs  Lab 04/02/20 2248 04/03/20 0721 04/04/20 0916 04/05/20 0825  WBC 9.8 10.9* 10.6* 9.6  NEUTROABS  --   --  8.2* 6.8  HGB 10.1* 9.9* 9.3* 8.7*  HCT 30.3* 29.4* 27.5* 25.7*  MCV 83.2 83.5 81.8 82.6  PLT 180 189 163 162    Cardiac Enzymes: Recent Labs  Lab 04/03/20 0721 04/04/20 1357  CKTOTAL 2,732* 2,334*    BNP: Invalid input(s): POCBNP  CBG: No results for input(s): GLUCAP in the last 168  hours.  Microbiology: Results for orders placed or performed during the hospital encounter of 04/03/20  SARS Coronavirus 2 by RT PCR (hospital order, performed in [facility] hospital lab) Nasopharyngeal Nasopharyngeal Swab     Status: None   Collection Time: 04/03/20  3:06 AM   Specimen: Nasopharyngeal Swab  Result Value Ref Range Status   SARS Coronavirus 2 NEGATIVE NEGATIVE Final    Comment: (NOTE) SARS-CoV-2 target nucleic acids are NOT DETECTED.  The SARS-CoV-2 RNA is generally detectable in upper and lower respiratory specimens during the acute phase of infection. The lowest concentration of SARS-CoV-2 viral copies this assay can detect is 250 copies / mL. A negative result does not preclude SARS-CoV-2 infection and should not be used as the sole basis for treatment or other patient management decisions.  A negative result may occur with improper specimen collection / handling, submission of specimen other than nasopharyngeal swab, presence of viral mutation(s) within the areas targeted by this assay, and inadequate number of viral copies (<250 copies / mL). A negative result must be combined with clinical observations, patient history, and  epidemiological information.  Fact Sheet for Patients:   StrictlyIdeas.no  Fact Sheet for Healthcare Providers: BankingDealers.co.za  This test is not yet approved or  cleared by the [place] FDA and has been authorized for detection and/or diagnosis of SARS-CoV-2 by FDA under an Emergency Use Authorization (EUA).  This EUA will remain in effect (meaning this test can be used) for the duration of the COVID-19 declaration under Section 564(b)(1) of the Act, 21 U.S.C. section 360bbb-3(b)(1), unless the authorization is terminated or revoked sooner.  Performed at [facility], [address] [place], [place] [address]     Coagulation Studies: No results for input(s): LABPROT, INR in the last 72 hours.  Urinalysis: Recent Labs    04/03/20 0201  COLORURINE YELLOW*  LABSPEC 1.011  PHURINE 5.0  GLUCOSEU NEGATIVE  HGBUR MODERATE*  BILIRUBINUR NEGATIVE  KETONESUR NEGATIVE  PROTEINUR 100*  NITRITE NEGATIVE  LEUKOCYTESUR NEGATIVE      Imaging: US RENAL  Result Date: 04/04/2020 CLINICAL DATA:  Acute kidney injury. EXAM: RENAL / URINARY TRACT ULTRASOUND COMPLETE COMPARISON:  CT of the abdomen and pelvis on 04/03/2020 FINDINGS: Right Kidney: Renal measurements: 7.3 x 3.8 x 3.8 centimeters = volume: 56 mL. Renal parenchyma is hyperechoic. No focal renal mass. No hydronephrosis. Left Kidney: Renal measurements: 7.8 x 5.7 x 3.9 centimeters = volume: 92 mL. Renal parenchyma is hyperechoic. No focal renal mass or hydronephrosis. Bladder: Foley catheter decompresses the urinary bladder. Bladder wall thickening identified, with decompressed urinary bladder. Bladder wall measures approximately 1.1 centimeters. Other: None. IMPRESSION: 1. Hyperechoic renal parenchyma and small kidneys bilaterally, consistent with chronic renal disease. 2. No focal renal mass or hydronephrosis. 3. Foley catheter decompresses the urinary bladder. 4.  Bladder wall thickening, a nonspecific finding. This can be seen in the setting of urinary tract infection and/or urinary bladder outlet obstruction. Electronically Signed   By: Nolon Nations M.D.   On: 04/04/2020 10:10     Medications:    calcium gluconate 1,000 mg (04/05/20 0548)    sodium bicarbonate  infusion 1000 mL 100 mL/hr at 04/05/20 0349    amLODipine  10 mg Oral Daily   dutasteride  0.5 mg Oral Daily   heparin  5,000 Units Subcutaneous Q8H   hydrALAZINE  10 mg Oral Q8H   metoprolol tartrate  12.5 mg Oral BID   tamsulosin  0.4 mg Oral Daily  acetaminophen **OR** acetaminophen, hydrALAZINE, LORazepam  Assessment/ Plan:  Joseph Gilmore is a 49 y.o. black male with learning disability, autism, hypertension, obsessive compulsive disorder who is admitted to [facility] on 04/03/2020 for Hypocalcemia [E83.51] Enteritis [K52.9] Prolonged Q-T interval on ECG [R94.31] AKI (acute kidney injury) ([place]) [N17.9] Acute kidney injury ([place]) [N17.9]  1.  Acute kidney injury with metabolic acidosis and proteinuria. No known baseline creatinine.  Renal ultrasound consistent with chronic kidney disease.  No IV contrast exposure.  Acute renal failure seems to be secondary to obstructive uropathy. Foley catheter placed Work up so far with negative for viral hepatitis, HIV negative.  Pending SPEP/UPEP, ANA, ANCA, anti-GBM, serum complements.  Nonoliguric urine output.    - IV fluids: sodium bicarbonate infusion.  - Patient will need dialysis. Will plan for first treatment tomorrow.  - Consult vascular  2. Hypocalcemia: differential includes chronic kidney disease. Albumin 3.4. Patient no prescribed home medications.  Hyperphosphatemia also consistent with chronic kidney disease.  TSH within normal limits, HIV negative Pending PTH and Vitamin D level.  - IV calcium gluconate - Start PO calcium acetate with meals.   3. Hypertension: 146/76. Current regimen of metoprolol,  hydralazine, amlodipine and tamsulosin. Holding ACE-I/ARB and diuretics due to renal failure at this time.   4. Anemia with renal failure and iron deficiency: folic acid level is also low.  hemoglobin 8.7.  - ESA with dialysis treatments.  - Start renal vitamin.    5. Obstructive uropathy: foley catheter placed - started on tamsulosin and dutasteride this admission - Will need urology input.   6. Hypokalemia: secondary to bicarb infusion - PO potassium chloride.    LOS: 2 Joseph Gilmore 8/8/202112:19 PM

## 2020-04-06 ENCOUNTER — Encounter
Admission: EM | Disposition: A | Discharge status: Home Health | Payer: Self-pay | Source: Home / Self Care | Attending: Internal Medicine

## 2020-04-06 ENCOUNTER — Inpatient Hospital Stay
Admit: 2020-04-06 | Discharge: 2020-04-06 | Disposition: A | Discharge status: Home / Self Care | Payer: Medicare Other | Attending: Internal Medicine | Admitting: Internal Medicine

## 2020-04-06 ENCOUNTER — Inpatient Hospital Stay: Payer: Medicare Other

## 2020-04-06 ENCOUNTER — Other Ambulatory Visit (INDEPENDENT_AMBULATORY_CARE_PROVIDER_SITE_OTHER): Payer: Self-pay | Admitting: Vascular Surgery

## 2020-04-06 DIAGNOSIS — R509 Fever, unspecified: Secondary | ICD-10-CM | POA: Diagnosis not present

## 2020-04-06 DIAGNOSIS — I10 Essential (primary) hypertension: Secondary | ICD-10-CM

## 2020-04-06 DIAGNOSIS — K529 Noninfective gastroenteritis and colitis, unspecified: Secondary | ICD-10-CM | POA: Diagnosis not present

## 2020-04-06 DIAGNOSIS — D638 Anemia in other chronic diseases classified elsewhere: Secondary | ICD-10-CM | POA: Diagnosis not present

## 2020-04-06 DIAGNOSIS — R339 Retention of urine, unspecified: Secondary | ICD-10-CM

## 2020-04-06 DIAGNOSIS — N185 Chronic kidney disease, stage 5: Secondary | ICD-10-CM

## 2020-04-06 DIAGNOSIS — N179 Acute kidney failure, unspecified: Secondary | ICD-10-CM | POA: Diagnosis not present

## 2020-04-06 HISTORY — PX: DIALYSIS/PERMA CATHETER INSERTION: CATH118288

## 2020-04-06 LAB — CBC WITH DIFFERENTIAL/PLATELET
Abs Immature Granulocytes: 0.09 10*3/uL — ABNORMAL HIGH (ref 0.00–0.07)
Basophils Absolute: 0.1 10*3/uL (ref 0.0–0.1)
Basophils Relative: 1 %
Eosinophils Absolute: 0.2 10*3/uL (ref 0.0–0.5)
Eosinophils Relative: 2 %
HCT: 26.1 % — ABNORMAL LOW (ref 39.0–52.0)
Hemoglobin: 8.4 g/dL — ABNORMAL LOW (ref 13.0–17.0)
Immature Granulocytes: 1 %
Lymphocytes Relative: 17 %
Lymphs Abs: 1.8 10*3/uL (ref 0.7–4.0)
MCH: 27.7 pg (ref 26.0–34.0)
MCHC: 32.2 g/dL (ref 30.0–36.0)
MCV: 86.1 fL (ref 80.0–100.0)
Monocytes Absolute: 1.2 10*3/uL — ABNORMAL HIGH (ref 0.1–1.0)
Monocytes Relative: 11 %
Neutro Abs: 7.4 10*3/uL (ref 1.7–7.7)
Neutrophils Relative %: 68 %
Platelets: 171 10*3/uL (ref 150–400)
RBC: 3.03 MIL/uL — ABNORMAL LOW (ref 4.22–5.81)
RDW: 13.9 % (ref 11.5–15.5)
WBC: 10.8 10*3/uL — ABNORMAL HIGH (ref 4.0–10.5)
nRBC: 0 % (ref 0.0–0.2)

## 2020-04-06 LAB — COMPREHENSIVE METABOLIC PANEL
ALT: 28 U/L (ref 0–44)
AST: 22 U/L (ref 15–41)
Albumin: 3.3 g/dL — ABNORMAL LOW (ref 3.5–5.0)
Alkaline Phosphatase: 48 U/L (ref 38–126)
Anion gap: 15 (ref 5–15)
BUN: 69 mg/dL — ABNORMAL HIGH (ref 6–20)
CO2: 26 mmol/L (ref 22–32)
Calcium: 4.9 mg/dL — CL (ref 8.9–10.3)
Chloride: 101 mmol/L (ref 98–111)
Creatinine, Ser: 13.38 mg/dL — ABNORMAL HIGH (ref 0.61–1.24)
GFR calc Af Amer: 4 mL/min — ABNORMAL LOW (ref >60)
GFR calc non Af Amer: 4 mL/min — ABNORMAL LOW (ref >60)
Glucose, Bld: 106 mg/dL — ABNORMAL HIGH (ref 70–99)
Potassium: 3.7 mmol/L (ref 3.5–5.1)
Sodium: 142 mmol/L (ref 135–145)
Total Bilirubin: 0.7 mg/dL (ref 0.3–1.2)
Total Protein: 6.2 g/dL — ABNORMAL LOW (ref 6.5–8.1)

## 2020-04-06 LAB — ECHOCARDIOGRAM COMPLETE
AR max vel: 2.55 cm2
AV Area VTI: 3.22 cm2
AV Area mean vel: 2.42 cm2
AV Mean grad: 6.3 mmHg
AV Peak grad: 9.6 mmHg
Ao pk vel: 1.55 m/s
Area-P 1/2: 2.77 cm2
S' Lateral: 2.65 cm
Weight: 3953.6 oz

## 2020-04-06 LAB — CBC
HCT: 29.4 % — ABNORMAL LOW (ref 39.0–52.0)
Hemoglobin: 9.6 g/dL — ABNORMAL LOW (ref 13.0–17.0)
MCH: 27.8 pg (ref 26.0–34.0)
MCHC: 32.7 g/dL (ref 30.0–36.0)
MCV: 85.2 fL (ref 80.0–100.0)
Platelets: 178 10*3/uL (ref 150–400)
RBC: 3.45 MIL/uL — ABNORMAL LOW (ref 4.22–5.81)
RDW: 13.9 % (ref 11.5–15.5)
WBC: 13.9 10*3/uL — ABNORMAL HIGH (ref 4.0–10.5)
nRBC: 0 % (ref 0.0–0.2)

## 2020-04-06 LAB — PROTIME-INR
INR: 1.2 (ref 0.8–1.2)
Prothrombin Time: 15.1 seconds (ref 11.4–15.2)

## 2020-04-06 LAB — MAGNESIUM: Magnesium: 1.5 mg/dL — ABNORMAL LOW (ref 1.7–2.4)

## 2020-04-06 LAB — ANA W/REFLEX IF POSITIVE: Anti Nuclear Antibody (ANA): NEGATIVE

## 2020-04-06 LAB — CK: Total CK: 2023 U/L — ABNORMAL HIGH (ref 49–397)

## 2020-04-06 SURGERY — DIALYSIS/PERMA CATHETER INSERTION
Anesthesia: Choice

## 2020-04-06 MED ORDER — MIDAZOLAM HCL 5 MG/5ML IJ SOLN
INTRAMUSCULAR | Status: AC
  Filled 2020-04-06: qty 5

## 2020-04-06 MED ORDER — MIDAZOLAM HCL 2 MG/2ML IJ SOLN
INTRAMUSCULAR | Status: DC | PRN
  Administered 2020-04-06: 2 mg via INTRAVENOUS

## 2020-04-06 MED ORDER — CEFAZOLIN SODIUM-DEXTROSE 1-4 GM/50ML-% IV SOLN
1.0000 g | Freq: Once | INTRAVENOUS | Status: AC
  Administered 2020-04-06: 1 g via INTRAVENOUS

## 2020-04-06 MED ORDER — MIDAZOLAM HCL 2 MG/ML PO SYRP: 8.0000 mg | ORAL_SOLUTION | Freq: Once | ORAL | Status: AC | PRN

## 2020-04-06 MED ORDER — SODIUM CHLORIDE 0.9 % IV SOLN
INTRAVENOUS | Status: DC | PRN
  Administered 2020-04-06 – 2020-04-07 (×3): 250 mL via INTRAVENOUS
  Administered 2020-04-10: 1000 mL via INTRAVENOUS

## 2020-04-06 MED ORDER — HEPARIN SODIUM (PORCINE) 10000 UNIT/ML IJ SOLN
INTRAMUSCULAR | Status: AC
  Filled 2020-04-06: qty 1

## 2020-04-06 MED ORDER — FENTANYL CITRATE (PF) 100 MCG/2ML IJ SOLN
INTRAMUSCULAR | Status: AC
  Filled 2020-04-06: qty 2

## 2020-04-06 MED ORDER — HYDROMORPHONE HCL 1 MG/ML IJ SOLN: 1.0000 mg | Freq: Once | INTRAMUSCULAR | Status: DC | PRN

## 2020-04-06 MED ORDER — FAMOTIDINE 20 MG PO TABS: 40.0000 mg | ORAL_TABLET | Freq: Once | ORAL | Status: DC | PRN

## 2020-04-06 MED ORDER — FENTANYL CITRATE (PF) 100 MCG/2ML IJ SOLN
INTRAMUSCULAR | Status: DC | PRN
  Administered 2020-04-06: 50 ug via INTRAVENOUS

## 2020-04-06 MED ORDER — MAGNESIUM SULFATE 2 GM/50ML IV SOLN
2.0000 g | Freq: Once | INTRAVENOUS | Status: AC
  Administered 2020-04-06: 2 g via INTRAVENOUS
  Filled 2020-04-06: qty 50

## 2020-04-06 MED ORDER — METHYLPREDNISOLONE SODIUM SUCC 125 MG IJ SOLR: 125.0000 mg | Freq: Once | INTRAMUSCULAR | Status: DC | PRN

## 2020-04-06 MED ORDER — MIDAZOLAM HCL 2 MG/ML PO SYRP
ORAL_SOLUTION | ORAL | Status: AC
  Administered 2020-04-06: 8 mg via ORAL
  Filled 2020-04-06: qty 4

## 2020-04-06 MED ORDER — ONDANSETRON HCL 4 MG/2ML IJ SOLN: 4.0000 mg | Freq: Four times a day (QID) | INTRAMUSCULAR | Status: DC | PRN

## 2020-04-06 MED ORDER — SODIUM CHLORIDE 0.9 % IV SOLN
1.0000 g | INTRAVENOUS | Status: DC
  Administered 2020-04-06: 1 g via INTRAVENOUS
  Filled 2020-04-06: qty 1

## 2020-04-06 MED ORDER — DIPHENHYDRAMINE HCL 50 MG/ML IJ SOLN: 50.0000 mg | Freq: Once | INTRAMUSCULAR | Status: DC | PRN

## 2020-04-06 MED ORDER — SODIUM CHLORIDE 0.9 % IV SOLN: INTRAVENOUS | Status: DC

## 2020-04-06 SURGICAL SUPPLY — 8 items
ADH SKN CLS APL DERMABOND .7 (GAUZE/BANDAGES/DRESSINGS) ×1
CATH CANNON HEMO 15FR 23CM (HEMODIALYSIS SUPPLIES) ×2 IMPLANT
DERMABOND ADVANCED (GAUZE/BANDAGES/DRESSINGS) ×2
DERMABOND ADVANCED .7 DNX12 (GAUZE/BANDAGES/DRESSINGS) IMPLANT
PACK ANGIOGRAPHY (CUSTOM PROCEDURE TRAY) ×2 IMPLANT
SUT MNCRL AB 4-0 PS2 18 (SUTURE) ×2 IMPLANT
SUT PROLENE 0 CT 1 30 (SUTURE) ×2 IMPLANT
TOWEL OR 17X26 4PK STRL BLUE (TOWEL DISPOSABLE) ×2 IMPLANT

## 2020-04-06 NOTE — Care Management Important Message (Signed)
Important Message  Patient Details  Name: Joseph Gilmore MRN: [id number] Date of Birth: 1971-03-12   Medicare Important Message Given:  Yes     [name] 04/06/2020, 11:49 AM

## 2020-04-06 NOTE — Progress Notes (Signed)
   04/06/20 0010  Assess: MEWS Score  BP (!) 166/91  Pulse Rate (!) 102  Assess: MEWS Score  MEWS Temp 1  MEWS Systolic 0  MEWS Pulse 1  MEWS RR 0  MEWS LOC 0  MEWS Score 2  MEWS Score Color Yellow  Assess: if the MEWS score is Yellow or Red  Were vital signs taken at a resting state? Yes  Focused Assessment No change from prior assessment  Early Detection of Sepsis Score *See Row Information* Low  MEWS guidelines implemented *See Row Information* No, other (Comment) (Pt BP has had HTN)  Treat  MEWS Interventions Administered prn meds/treatments  Pain Scale 0-10  Pain Score 0  Document  Patient Outcome Other (Comment) (Pt has HTN)

## 2020-04-06 NOTE — Progress Notes (Signed)
   04/06/20 1953  Assess: MEWS Score  Temp (!) 102.2 F (39 C)  BP (!) 162/89  Pulse Rate 97  Resp 20  SpO2 98 %  O2 Device Room Air  Assess: MEWS Score  MEWS Temp 2  MEWS Systolic 0  MEWS Pulse 0  MEWS RR 0  MEWS LOC 0  MEWS Score 2  MEWS Score Color Yellow  Assess: if the MEWS score is Yellow or Red  Were vital signs taken at a resting state? Yes  Focused Assessment No change from prior assessment  Early Detection of Sepsis Score *See Row Information* Medium  MEWS guidelines implemented *See Row Information* Yes  Treat  MEWS Interventions Administered prn meds/treatments;Administered scheduled meds/treatments  Pain Scale 0-10  Pain Score 0  Take Vital Signs  Increase Vital Sign Frequency  Yellow: Q 2hr X 2 then Q 4hr X 2, if remains yellow, continue Q 4hrs  Escalate  MEWS: Escalate Yellow: discuss with charge nurse/RN and consider discussing with provider and RRT  Notify: Charge Nurse/RN  Name of Charge Nurse/RN Notified Angela,RN  Date Charge Nurse/RN Notified 04/06/20  Time Charge Nurse/RN Notified 2008  Notify: Provider  Provider Name/Title NP [place]  Date Provider Notified 04/06/20  Time Provider Notified 2008  Notification Type Page  Notification Reason Other (Comment) (pt running a fever this is not new)  Response No new orders  Date of Provider Response 04/06/20  Time of Provider Response 2009  Document  Patient Outcome Other (Comment) (Pt is going to get all meds )  Progress note created (see row info) Yes  Pt has continuously had a fever and HTN. RN will continue to monitor and treat.

## 2020-04-06 NOTE — Progress Notes (Signed)
[facility]  ROUNDING NOTE   Subjective:   Patient without complaints. Febrile  UOP 833mL Bicarb infusion at 179mL/hr.   Creatinine 13.38 (14.11)    Calcium 4.9 ( 4.8) - correct calcium 5.5  Objective:  Vital signs in last 24 hours:  Temp:  [100.5 F (38.1 C)-101.1 F (38.4 C)] 100.9 F (38.3 C) (08/09 1146) Pulse Rate:  [94-102] 94 (08/09 1146) Resp:  [18-20] 18 (08/09 1146) BP: (133-171)/(88-102) 134/88 (08/09 1146) SpO2:  [96 %-99 %] 97 % (08/09 1146) Weight:  [112.1 kg] 112.1 kg (08/09 0428)  Weight change: 2.812 kg Filed Weights   04/04/20 0404 04/05/20 0504 04/06/20 0428  Weight: 109.6 kg 109.3 kg 112.1 kg    Intake/Output: I/O last 3 completed shifts: In: 1240 [P.O.:240; I.V.:1000] Out: 1300 [Urine:1300]   Intake/Output this shift:  Total I/O In: -  Out: 700 [Urine:700]  Physical Exam: General: NAD,   Head: Normocephalic, atraumatic. Moist oral mucosal membranes  Eyes: Anicteric, PERRL  Neck: Supple, trachea midline  Lungs:  Clear to auscultation  Heart: Regular rate and rhythm  Abdomen:  Soft, nontender,   Extremities:  no peripheral edema.  Neurologic: +learning disability, unable to give history  Skin: No lesions  Access none  GU Foley catheter with urine clear    Basic Metabolic Panel: Recent Labs  Lab 04/03/20 0248 04/03/20 0248 04/03/20 0721 04/03/20 0721 04/03/20 1809 04/04/20 0916 04/05/20 0825 04/06/20 0527  NA 143  --  140  --   --  141 139 142  K 4.1  --  4.0  --   --  3.6 3.2* 3.7  CL 110  --  111  --   --  107 102 101  CO2 19*  --  14*  --   --  16* 21* 26  GLUCOSE 96  --  108*  --   --  102* 102* 106*  BUN 73*  --  74*  --   --  72* 69* 69*  CREATININE 15.20*  --  15.07*  --   --  14.15* 14.11* 13.38*  CALCIUM 4.1*   < > 4.4*   < >  --  4.6* 4.8* 4.9*  MG  --   --  1.5*  --   --  1.5*  --  1.5*  PHOS  --   --   --   --  6.3*  --  7.0*  --    < > = values in this interval not displayed.    Liver  Function Tests: Recent Labs  Lab 04/02/20 2248 04/03/20 0248 04/04/20 0916 04/05/20 0825 04/06/20 0527  AST 51* 48* 22  --  22  ALT 69* 66* 36  --  28  ALKPHOS 58 62 51  --  48  BILITOT 0.5 0.8 0.8  --  0.7  PROT 7.0 7.6 6.4*  --  6.2*  ALBUMIN 3.8 3.9 3.4* 3.1* 3.3*   Recent Labs  Lab 04/02/20 2248 04/04/20 1357  LIPASE 90* 71*   No results for input(s): AMMONIA in the last 168 hours.  CBC: Recent Labs  Lab 04/02/20 2248 04/03/20 0721 04/04/20 0916 04/05/20 0825 04/06/20 0527  WBC 9.8 10.9* 10.6* 9.6 10.8*  NEUTROABS  --   --  8.2* 6.8 7.4  HGB 10.1* 9.9* 9.3* 8.7* 8.4*  HCT 30.3* 29.4* 27.5* 25.7* 26.1*  MCV 83.2 83.5 81.8 82.6 86.1  PLT 180 189 163 162 171    Cardiac Enzymes: Recent Labs  Lab 04/03/20  8315 04/04/20 1357 04/06/20 0730  CKTOTAL 2,732* 2,334* 2,023*    BNP: Invalid input(s): POCBNP  CBG: No results for input(s): GLUCAP in the last 168 hours.  Microbiology: Results for orders placed or performed during the hospital encounter of 04/03/20  SARS Coronavirus 2 by RT PCR (hospital order, performed in [facility] hospital lab) Nasopharyngeal Nasopharyngeal Swab     Status: None   Collection Time: 04/03/20  3:06 AM   Specimen: Nasopharyngeal Swab  Result Value Ref Range Status   SARS Coronavirus 2 NEGATIVE NEGATIVE Final    Comment: (NOTE) SARS-CoV-2 target nucleic acids are NOT DETECTED.  The SARS-CoV-2 RNA is generally detectable in upper and lower respiratory specimens during the acute phase of infection. The lowest concentration of SARS-CoV-2 viral copies this assay can detect is 250 copies / mL. A negative result does not preclude SARS-CoV-2 infection and should not be used as the sole basis for treatment or other patient management decisions.  A negative result may occur with improper specimen collection / handling, submission of specimen other than nasopharyngeal swab, presence of viral mutation(s) within the areas targeted by  this assay, and inadequate number of viral copies (<250 copies / mL). A negative result must be combined with clinical observations, patient history, and epidemiological information.  Fact Sheet for Patients:   StrictlyIdeas.no  Fact Sheet for Healthcare Providers: BankingDealers.co.za  This test is not yet approved or  cleared by the [place] FDA and has been authorized for detection and/or diagnosis of SARS-CoV-2 by FDA under an Emergency Use Authorization (EUA).  This EUA will remain in effect (meaning this test can be used) for the duration of the COVID-19 declaration under Section 564(b)(1) of the Act, 21 U.S.C. section 360bbb-3(b)(1), unless the authorization is terminated or revoked sooner.  Performed at [facility], [address] [place], [place] [address]   Culture, blood (Routine X 2) w Reflex to ID Panel     Status: None (Preliminary result)   Collection Time: 04/05/20  5:08 PM   Specimen: BLOOD  Result Value Ref Range Status   Specimen Description BLOOD LAC  Final   Special Requests   Final    BOTTLES DRAWN AEROBIC AND ANAEROBIC Blood Culture adequate volume   Culture   Final    NO GROWTH < 12 HOURS Performed at [facility], [address], [place], [place] [address]    Report Status PENDING  Incomplete    Coagulation Studies: Recent Labs    04/06/20 0527  LABPROT 15.1  INR 1.2    Urinalysis: No results for input(s): COLORURINE, LABSPEC, PHURINE, GLUCOSEU, HGBUR, BILIRUBINUR, KETONESUR, PROTEINUR, UROBILINOGEN, NITRITE, LEUKOCYTESUR in the last 72 hours.  Invalid input(s): APPERANCEUR    Imaging: DG Chest 2 View  Result Date: 04/06/2020 CLINICAL DATA:  New onset fever EXAM: CHEST - 2 VIEW COMPARISON:  04/02/2020 FINDINGS: Mild cardiomegaly. Low volume examination. Both lungs are clear. The visualized skeletal structures are unremarkable. IMPRESSION: Mild cardiomegaly. No acute  abnormality of the lungs in low volume examination. No focal airspace opacity. Electronically Signed   By: Eddie Candle M.D.   On: 04/06/2020 08:01     Medications:   . sodium chloride 250 mL (04/06/20 1137)   . amLODipine  10 mg Oral Daily  . calcium acetate  1,334 mg Oral TID WC  . dutasteride  0.5 mg Oral Daily  . heparin  5,000 Units Subcutaneous Q8H  . hydrALAZINE  10 mg Oral Q8H  . metoprolol tartrate  12.5 mg Oral BID  . multivitamin  1 tablet Oral QHS  . sodium chloride flush  10 mL Intravenous Q12H  . tamsulosin  0.4 mg Oral Daily   sodium chloride, acetaminophen **OR** acetaminophen, hydrALAZINE, LORazepam  Assessment/ Plan:  Mr. KENNEDY BOHANON is a 49 y.o. black male with learning disability, autism, hypertension, obsessive compulsive disorder who is admitted to [facility] on 04/03/2020 for Hypocalcemia [E83.51] Enteritis [K52.9] Prolonged Q-T interval on ECG [R94.31] AKI (acute kidney injury) ([place]) [N17.9] Acute kidney injury ([place]) [N17.9]  1.  Acute kidney injury with metabolic acidosis and proteinuria. No known baseline creatinine.  Renal ultrasound consistent with chronic kidney disease.  No IV contrast exposure.  Acute renal failure seems to be secondary to obstructive uropathy. Foley catheter placed Work up so far with negative for viral hepatitis, HIV negative.  Pending SPEP/UPEP, ANA, ANCA, anti-GBM, serum complements.  Nonoliguric urine output.    - Discontinue sodium bicarbonate infusion - Dialysis for today. Consult vascular for dialysis access placement  2. Hypocalcemia: differential includes chronic kidney disease. Albumin 3.3. Corrected calcium of 5.5.  Hyperphosphatemia also consistent with chronic kidney disease.  TSH within normal limits, HIV negative Pending PTH and Vitamin D level.  - IV calcium gluconate - Continue PO calcium acetate with meals.   3. Hypertension: emergency on admission.  134/88 today  Current regimen of metoprolol, hydralazine,  amlodipine and tamsulosin. Holding ACE-I/ARB and diuretics due to renal failure at this time.   4. Anemia with renal failure and iron deficiency: folic acid level is also low.  hemoglobin 8.4 - ESA with dialysis treatments.  - Continue renal vitamin.    5. Obstructive uropathy: foley catheter placed - started on tamsulosin and dutasteride this admission - Appreciate urology input.     LOS: 3 Aluna Whiston 8/9/20211:04 PM

## 2020-04-06 NOTE — Consult Note (Signed)
Urology Consult  I have been asked to see the patient by Dr. Posey Pronto, for evaluation and management of urinary retention.  Chief Complaint: Vomiting  History of Present Illness: Joseph Gilmore is a 49 y.o. year old male with PMH autism who presented to the ED on 04/02/2020 accompanied by his father with reports of a 2-week history of intermittent vomiting and excessive salivation. He had not seen his PCP in many years. He was found to have hypertension to 222/136, ARF with creatinine 15, metabolic acidosis, and hypocalcemia and admitted for management of these.  Admission UA with no nitrites, 0-5 WBCs/hpf, 0-5 RBCs/hpf, rare bacteria, and budding yeast. No urine culture obtained. PSA drawn on 04/04/2020 26.65, no baseline available.  CTAP without contrast dated 04/03/2020 revealed an enlarged prostate measuring approximately 4.5*5.4*5.2cm and diffuse bladder wall thickening consistent with BOO. No stones or renal lesions visualized. Renal ultrasound dated 04/04/2020 revealed hyperechoic renal parenchyma and small bilateral kidneys consistent with chronic renal disease and diffuse bladder wall thickening. Foley catheter was placed on 04/03/2020 with immediate drainage of 500cc urine. He has been started on Flomax and dutasteride during this admission.  Creatinine down today, 13.38. WBC count up today, 10.8. He is scheduled to undergo PermaCath placement today with initiation of HD thereafter. Nephrology consulting.  Patient is accompanied today by his father at the bedside, who contributes to HPI.  No known history of difficulty urinating, constipation, or urologic care.  Father does report a personal history of BPH on dual pharmacotherapy, started in his 60s.  Father has not undergone urologic procedures in the past.  Past Medical History:  Diagnosis Date  . Autism   . Murmur   . OCD (obsessive compulsive disorder)     History reviewed. No pertinent surgical history.  Home Medications:  No  outpatient medications have been marked as taking for the 04/03/20 encounter [facility] Encounter).    Allergies:  Allergies  Allergen Reactions  . Chlorhexidine     History reviewed. No pertinent family history.  Social History:  reports that he has never smoked. He has never used smokeless tobacco. He reports that he does not drink alcohol and does not use drugs.  ROS: A complete review of systems was performed.  All systems are negative except for pertinent findings as noted.  Physical Exam:  Vital signs in last 24 hours: Temp:  [100.5 F (38.1 C)-101.1 F (38.4 C)] 101.1 F (38.4 C) (08/09 0757) Pulse Rate:  [94-102] 100 (08/09 0757) Resp:  [18-20] 18 (08/09 0757) BP: (133-171)/(76-102) 171/90 (08/09 0757) SpO2:  [96 %-99 %] 97 % (08/09 0757) Weight:  [112.1 kg] 112.1 kg (08/09 0428) Constitutional:  Alert and oriented, no acute distress HEENT: [place] AT, moist mucus membranes Cardiovascular: No clubbing, cyanosis, or edema Respiratory: Normal respiratory effort Skin: No rashes, bruises or suspicious lesions Neurologic: Lying still in bed Psychiatric: Verbally responsive, poor insight per father   Laboratory Data:  Recent Labs    04/04/20 0916 04/05/20 0825 04/06/20 0527  WBC 10.6* 9.6 10.8*  HGB 9.3* 8.7* 8.4*  HCT 27.5* 25.7* 26.1*   Recent Labs    04/04/20 0916 04/05/20 0825 04/06/20 0527  NA 141 139 142  K 3.6 3.2* 3.7  CL 107 102 101  CO2 16* 21* 26  GLUCOSE 102* 102* 106*  BUN 72* 69* 69*  CREATININE 14.15* 14.11* 13.38*  CALCIUM 4.6* 4.8* 4.9*   Recent Labs    04/06/20 0527  INR 1.2   Urinalysis  Component Value Date/Time   COLORURINE YELLOW (A) 04/03/2020 0201   APPEARANCEUR HAZY (A) 04/03/2020 0201   LABSPEC 1.011 04/03/2020 0201   PHURINE 5.0 04/03/2020 0201   GLUCOSEU NEGATIVE 04/03/2020 0201   HGBUR MODERATE (A) 04/03/2020 0201   BILIRUBINUR NEGATIVE 04/03/2020 0201   KETONESUR NEGATIVE 04/03/2020 0201   PROTEINUR 100 (A)  04/03/2020 0201   NITRITE NEGATIVE 04/03/2020 0201   LEUKOCYTESUR NEGATIVE 04/03/2020 0201   Results for orders placed or performed during the hospital encounter of 04/03/20  SARS Coronavirus 2 by RT PCR (hospital order, performed in [facility] hospital lab) Nasopharyngeal Nasopharyngeal Swab     Status: None   Collection Time: 04/03/20  3:06 AM   Specimen: Nasopharyngeal Swab  Result Value Ref Range Status   SARS Coronavirus 2 NEGATIVE NEGATIVE Final    Comment: (NOTE) SARS-CoV-2 target nucleic acids are NOT DETECTED.  The SARS-CoV-2 RNA is generally detectable in upper and lower respiratory specimens during the acute phase of infection. The lowest concentration of SARS-CoV-2 viral copies this assay can detect is 250 copies / mL. A negative result does not preclude SARS-CoV-2 infection and should not be used as the sole basis for treatment or other patient management decisions.  A negative result may occur with improper specimen collection / handling, submission of specimen other than nasopharyngeal swab, presence of viral mutation(s) within the areas targeted by this assay, and inadequate number of viral copies (<250 copies / mL). A negative result must be combined with clinical observations, patient history, and epidemiological information.  Fact Sheet for Patients:   StrictlyIdeas.no  Fact Sheet for Healthcare Providers: BankingDealers.co.za  This test is not yet approved or  cleared by the [place] FDA and has been authorized for detection and/or diagnosis of SARS-CoV-2 by FDA under an Emergency Use Authorization (EUA).  This EUA will remain in effect (meaning this test can be used) for the duration of the COVID-19 declaration under Section 564(b)(1) of the Act, 21 U.S.C. section 360bbb-3(b)(1), unless the authorization is terminated or revoked sooner.  Performed at [facility], [address]  [place], [place] [address]   Culture, blood (Routine X 2) w Reflex to ID Panel     Status: None (Preliminary result)   Collection Time: 04/05/20  5:08 PM   Specimen: BLOOD  Result Value Ref Range Status   Specimen Description BLOOD LAC  Final   Special Requests   Final    BOTTLES DRAWN AEROBIC AND ANAEROBIC Blood Culture adequate volume   Culture   Final    NO GROWTH < 12 HOURS Performed at [facility], [address] [place], [place] [address]    Report Status PENDING  Incomplete   Radiologic Imaging: Renal US, 04/04/2020: CLINICAL DATA:  Acute kidney injury.  EXAM: RENAL / URINARY TRACT ULTRASOUND COMPLETE  COMPARISON:  CT of the abdomen and pelvis on 04/03/2020  FINDINGS: Right Kidney:  Renal measurements: 7.3 x 3.8 x 3.8 centimeters = volume: 56 mL. Renal parenchyma is hyperechoic. No focal renal mass. No hydronephrosis.  Left Kidney:  Renal measurements: 7.8 x 5.7 x 3.9 centimeters = volume: 92 mL. Renal parenchyma is hyperechoic. No focal renal mass or hydronephrosis.  Bladder:  Foley catheter decompresses the urinary bladder. Bladder wall thickening identified, with decompressed urinary bladder. Bladder wall measures approximately 1.1 centimeters.  Other:  None.  IMPRESSION: 1. Hyperechoic renal parenchyma and small kidneys bilaterally, consistent with chronic renal disease. 2. No focal renal mass or hydronephrosis. 3. Foley catheter  decompresses the urinary bladder. 4. Bladder wall thickening, a nonspecific finding. This can be seen in the setting of urinary tract infection and/or urinary bladder outlet obstruction.   Electronically Signed   By: Nolon Nations M.D.   On: 04/04/2020 10:10  CT AP w/o contrast: CLINICAL DATA:  Nausea, vomiting, and shortness of breath  EXAM: CT ABDOMEN AND PELVIS WITHOUT CONTRAST  TECHNIQUE: Multidetector CT imaging of the abdomen and pelvis was performed following the standard protocol  without IV contrast.  COMPARISON:  None.  FINDINGS: Lower chest: Motion artifact limits evaluation. Lung bases appear clear grossly.  Hepatobiliary: No focal liver abnormality is seen. No gallstones, gallbladder wall thickening, or biliary dilatation.  Pancreas: Unremarkable. No pancreatic ductal dilatation or surrounding inflammatory changes.  Spleen: Normal in size without focal abnormality.  Adrenals/Urinary Tract: Adrenal glands are unremarkable. Kidneys are normal, without renal calculi, focal lesion, or hydronephrosis. Bladder wall is diffusely thickened, likely due to hypertrophy from outlet obstruction.  Stomach/Bowel: Stomach, small bowel, and colon are not abnormally distended. Diffusely fluid-filled small bowel without wall thickening. Scattered stool in the colon. Colonic diverticula. No wall thickening or inflammatory changes.  Vascular/Lymphatic: No significant vascular findings are present. No enlarged abdominal or pelvic lymph nodes.  Reproductive: Prostate gland is enlarged, measuring 5.3 cm diameter.  Other: No free air or free fluid in the abdomen. Abdominal wall musculature appears intact.  Musculoskeletal: No acute or significant osseous findings.  IMPRESSION: 1. Fluid-filled nondistended small bowel may indicate enteritis. No evidence of obstruction. 2. Enlarged prostate gland. 3. Diffuse bladder wall thickening likely due to hypertrophy from outlet obstruction.   Electronically Signed   By: Lucienne Capers M.D.   On: 04/03/2020 04:00  Assessment & Plan:  49 year old male with PMH autism admitted with acute renal failure, baseline creatinine unknown, metabolic acidosis, and urinary retention s/p Foley catheter placement with reassuring admission UA. Imaging consistent with chronic renal disease and BPH with an approximate 66cc gland. Awaiting HD initiation; started on Flomax and dutasteride.  Suspect ARF represents an  acute-on-chronic process given renal atrophy on ultrasound, BPH on CT, hypertension on admission, and family history of ESRD. Agree with Foley placement and initiation of prostatic agents, though I explained to the patient and his father today that dutasteride is not expected to relieve his urinary obstruction in the short term.  Recommend continuing Foley, Flomax, and dutasteride with plans for outpatient voiding trial in 1-2 weeks. Given degree of bladder wall thickening, suspect he may benefit from a bladder outlet procedure in the future, not during current admission. PSA from two days ago likely to be falsely elevated in the setting of his Foley placement and recent retention. Recommend repeating this on an outpatient basis once no longer acutely ill.  Thank you for involving me in this patient's care, please page with any further questions or concerns.  Debroah Loop, PA-C 04/06/2020 9:42 AM

## 2020-04-06 NOTE — Progress Notes (Signed)
*  PRELIMINARY RESULTS* Echocardiogram 2D Echocardiogram has been performed.  Sherrie Sport 04/06/2020, 10:46 AM

## 2020-04-06 NOTE — Progress Notes (Signed)
PT Cancellation Note  Patient Details Name: Joseph Gilmore MRN: [id number] DOB: 04/06/1971   Cancelled Treatment:    Reason Eval/Treat Not Completed: Medical issues which prohibited therapy: Pt's Ca 4.9 falling outside guidelines for participation with PT services.  Will attempt to see pt at a future date/time as medically appropriate.     Linus Salmons PT, DPT 04/06/20, 10:22 AM

## 2020-04-06 NOTE — Plan of Care (Signed)
°  Problem: Education: Goal: Knowledge of General Education information will improve Description: Including pain rating scale, medication(s)/side effects and non-pharmacologic comfort measures Outcome: Progressing   Problem: Clinical Measurements: Goal: Will remain free from infection Outcome: Progressing Note: Pt started on abx  Goal: Diagnostic test results will improve Outcome: Progressing Goal: Cardiovascular complication will be avoided Outcome: Progressing   Problem: Activity: Goal: Risk for activity intolerance will decrease Outcome: Progressing   Problem: Safety: Goal: Ability to remain free from injury will improve Outcome: Progressing

## 2020-04-06 NOTE — Op Note (Signed)
OPERATIVE NOTE    PRE-OPERATIVE DIAGNOSIS: 1. ESRD   POST-OPERATIVE DIAGNOSIS: same as above  PROCEDURE: 1. Ultrasound guidance for vascular access to the right internal jugular vein 2. Fluoroscopic guidance for placement of catheter 3. Placement of a 23 cm tip to cuff tunneled hemodialysis catheter via the right internal jugular vein  SURGEON: [name], MD  ANESTHESIA:  Local with Moderate conscious sedation for approximately 15 minutes using 2 mg of Versed and 50 mcg of Fentanyl  ESTIMATED BLOOD LOSS: 5 cc  FLUORO TIME: less than one minute  CONTRAST: none  FINDING(S): 1.  Patent right internal jugular vein  SPECIMEN(S):  None  INDICATIONS:   Joseph Gilmore is a 49 y.o.male who presents with renal failure.  The patient needs long term dialysis access for their ESRD, and a Permcath is necessary.  Risks and benefits are discussed and informed consent is obtained.    DESCRIPTION: After obtaining full informed written consent, the patient was brought back to the vascular suited. The patient's right neck and chest were sterilely prepped and draped in a sterile surgical field was created. Moderate conscious sedation was administered during a face to face encounter with the patient throughout the procedure with my supervision of the RN administering medicines and monitoring the patient's vital signs, pulse oximetry, telemetry and mental status throughout from the start of the procedure until the patient was taken to the recovery room.  The right internal jugular vein was visualized with ultrasound and found to be patent. It was then accessed under direct ultrasound guidance and a permanent image was recorded. A wire was placed. After skin nick and dilatation, the peel-away sheath was placed over the wire. I then turned my attention to an area under the clavicle. Approximately 1-2 fingerbreadths below the clavicle a small counterincision was created and tunneled from the subclavicular  incision to the access site. Using fluoroscopic guidance, a 23 centimeter tip to cuff tunneled hemodialysis catheter was selected, and tunneled from the subclavicular incision to the access site. It was then placed through the peel-away sheath and the peel-away sheath was removed. Using fluoroscopic guidance the catheter tips were parked in the right atrium. The appropriate distal connectors were placed. It withdrew blood well and flushed easily with heparinized saline and a concentrated heparin solution was then placed. It was secured to the chest wall with 2 Prolene sutures. The access incision was closed single 4-0 Monocryl. A 4-0 Monocryl pursestring suture was placed around the exit site. Sterile dressings were placed. The patient tolerated the procedure well and was taken to the recovery room in stable condition.  COMPLICATIONS: None  CONDITION: Stable  [name], MD 04/06/2020 4:00 PM   This note was created with Dragon Medical transcription system. Any errors in dictation are purely unintentional.

## 2020-04-06 NOTE — Consult Note (Signed)
[facility] Consult Note  MRN : [id number]  Joseph Gilmore is a 49 y.o. ([birth year]) male who presents with chief complaint of  Chief Complaint  Patient presents with  . Emesis  . Shortness of Breath   History of Present Illness: Patient is a 49 year old male with a past medical history of autism, and hypertension presents to the emergency department in custody of his father. Patient's father endorses a history of vomiting and dyspnea today with occasional cough and excessive salivation. Patient's father states that this has been intermittent over the past 2 weeks.  He denies that his son has had any diarrhea.  States that the patient is followed by Dr. Brunetta Genera but that they have not seen him in a "long time, years". When he presented to the emergency department he had significantly elevated blood pressure of 221/136.  He had a CT scan of the abdomen pelvis which showed thickened bladder wall, large prostate consistent with bladder outlet obstruction.  However there was no hydronephrosis noted. His creatinine was 14.88 upon admission.  Currently creatinine 15.07.    Vascular surgery was consulted by Dr. Juleen China for insertion of a PermCath.  Current Facility-Administered Medications  Medication Dose Route Frequency Provider Last Rate Last Admin  . acetaminophen (TYLENOL) tablet 650 mg  650 mg Oral Q6H PRN Athena Masse, MD   650 mg at 04/06/20 0816   Or  . acetaminophen (TYLENOL) suppository 650 mg  650 mg Rectal Q6H PRN Athena Masse, MD      . amLODipine (NORVASC) tablet 10 mg  10 mg Oral Daily Para Skeans, MD   10 mg at 04/06/20 0816  . calcium acetate (PHOSLO) capsule 1,334 mg  1,334 mg Oral TID WC Kolluru, Sarath, MD   1,334 mg at 04/06/20 0816  . dutasteride (AVODART) capsule 0.5 mg  0.5 mg Oral Daily Florina Ou V, MD   0.5 mg at 04/06/20 0816  . heparin injection 5,000 Units  5,000 Units Subcutaneous Q8H Athena Masse, MD   5,000 Units at  04/06/20 0524  . hydrALAZINE (APRESOLINE) injection 10 mg  10 mg Intravenous Q4H PRN Para Skeans, MD   10 mg at 04/06/20 0524  . hydrALAZINE (APRESOLINE) tablet 10 mg  10 mg Oral Q8H Para Skeans, MD   10 mg at 04/06/20 0524  . LORazepam (ATIVAN) tablet 0.5 mg  0.5 mg Oral Q8H PRN Para Skeans, MD   0.5 mg at 04/03/20 1710  . magnesium sulfate IVPB 2 g 50 mL  2 g Intravenous Once Kolluru, Sarath, MD      . metoprolol tartrate (LOPRESSOR) tablet 12.5 mg  12.5 mg Oral BID Florina Ou V, MD   12.5 mg at 04/06/20 0816  . multivitamin (RENA-VIT) tablet 1 tablet  1 tablet Oral QHS Lavonia Dana, MD   1 tablet at 04/05/20 2101  . sodium bicarbonate 150 mEq in dextrose 5 % 1,000 mL infusion   Intravenous Continuous Para Skeans, MD 100 mL/hr at 04/06/20 0523 New Bag at 04/06/20 0523  . sodium chloride flush (NS) 0.9 % injection 10 mL  10 mL Intravenous Q12H Para Skeans, MD   10 mL at 04/06/20 0819  . tamsulosin (FLOMAX) capsule 0.4 mg  0.4 mg Oral Daily Para Skeans, MD   0.4 mg at 04/06/20 0998   Past Medical History:  Diagnosis Date  . Autism   . Murmur   . OCD (obsessive compulsive  disorder)    History reviewed. No pertinent surgical history.  Social History Social History   Tobacco Use  . Smoking status: Never Smoker  . Smokeless tobacco: Never Used  Vaping Use  . Vaping Use: Never used  Substance Use Topics  . Alcohol use: Never  . Drug use: Never   Family History History reviewed. No pertinent family history.  Father denies any family history of peripheral artery disease, venous disease or renal disease.  Allergies  Allergen Reactions  . Chlorhexidine    REVIEW OF SYSTEMS (Negative unless checked)  Constitutional: [] Weight loss  [] Fever  [] Chills Cardiac: [] Chest pain   [] Chest pressure   [] Palpitations   [] Shortness of breath when laying flat   [] Shortness of breath at rest   [] Shortness of breath with exertion. Vascular:  [] Pain in legs with walking   [] Pain in  legs at rest   [] Pain in legs when laying flat   [] Claudication   [] Pain in feet when walking  [] Pain in feet at rest  [] Pain in feet when laying flat   [] History of DVT   [] Phlebitis   [] Swelling in legs   [] Varicose veins   [] Non-healing ulcers Pulmonary:   [] Uses home oxygen   [] Productive cough   [] Hemoptysis   [] Wheeze  [] COPD   [] Asthma Neurologic:  [] Dizziness  [] Blackouts   [] Seizures   [] History of stroke   [] History of TIA  [] Aphasia   [] Temporary blindness   [] Dysphagia   [] Weakness or numbness in arms   [] Weakness or numbness in legs Musculoskeletal:  [] Arthritis   [] Joint swelling   [] Joint pain   [] Low back pain Hematologic:  [] Easy bruising  [] Easy bleeding   [] Hypercoagulable state   [x] Anemic  [] Hepatitis Gastrointestinal:  [] Blood in stool   [] Vomiting blood  [] Gastroesophageal reflux/heartburn   [] Difficulty swallowing. Genitourinary:  [x] Chronic kidney disease   [] Difficult urination  [] Frequent urination  [] Burning with urination   [] Blood in urine Skin:  [] Rashes   [] Ulcers   [] Wounds Psychological:  [x] History of anxiety   []  History of major depression.  Physical Examination  Vitals:   04/05/20 2048 04/06/20 0010 04/06/20 0428 04/06/20 0757  BP: (!) 167/100 (!) 166/91 (!) 171/96 (!) 171/90  Pulse: 99 (!) 102 98 100  Resp:   20 18  Temp:   (!) 100.9 F (38.3 C) (!) 101.1 F (38.4 C)  TempSrc:   Oral Oral  SpO2:   96% 97%  Weight:   112.1 kg   Height:       Body mass index is 37.57 kg/m. Gen:  Non-verbal, NAD Head: [place]/AT, No temporalis wasting. Prominent temp pulse not noted. Ear/Nose/Throat: Hearing grossly intact, nares w/o erythema or drainage, oropharynx w/o Erythema/Exudate Eyes: Sclera non-icteric, conjunctiva clear Neck: Trachea midline.  No JVD.  Pulmonary:  Good air movement, respirations not labored, equal bilaterally.  Cardiac: RRR, normal S1, S2. Vascular:  Vessel Right Left  Radial Palpable Palpable  Ulnar Palpable Palpable  Brachial Palpable  Palpable  Carotid Palpable, without bruit Palpable, without bruit  Aorta Not palpable N/A  Femoral Palpable Palpable  Popliteal Palpable Palpable  PT Palpable Palpable  DP Palpable Palpable   Gastrointestinal: soft, non-tender/non-distended. No guarding/reflex.  Musculoskeletal: M/S 5/5 throughout.  Extremities without ischemic changes.  No deformity or atrophy. No edema. Neurologic: Sensation grossly intact in extremities.  Symmetrical.  Speech is fluent. Motor exam as listed above. Psychiatric: Patient with hx of autism. Dermatologic: No rashes or ulcers noted.  No cellulitis or open wounds.  Lymph : No Cervical, Axillary, or Inguinal lymphadenopathy.  CBC Lab Results  Component Value Date   WBC 10.8 (H) 04/06/2020   HGB 8.4 (L) 04/06/2020   HCT 26.1 (L) 04/06/2020   MCV 86.1 04/06/2020   PLT 171 04/06/2020   BMET    Component Value Date/Time   NA 142 04/06/2020 0527   K 3.7 04/06/2020 0527   CL 101 04/06/2020 0527   CO2 26 04/06/2020 0527   GLUCOSE 106 (H) 04/06/2020 0527   BUN 69 (H) 04/06/2020 0527   CREATININE 13.38 (H) 04/06/2020 0527   CALCIUM 4.9 (LL) 04/06/2020 0527   GFRNONAA 4 (L) 04/06/2020 0527   GFRAA 4 (L) 04/06/2020 0527   Estimated Creatinine Clearance: 8.2 mL/min (A) (by C-G formula based on SCr of 13.38 mg/dL (H)).  COAG Lab Results  Component Value Date   INR 1.2 04/06/2020   Radiology CT ABDOMEN PELVIS WO CONTRAST  Result Date: 04/03/2020 CLINICAL DATA:  Nausea, vomiting, and shortness of breath EXAM: CT ABDOMEN AND PELVIS WITHOUT CONTRAST TECHNIQUE: Multidetector CT imaging of the abdomen and pelvis was performed following the standard protocol without IV contrast. COMPARISON:  None. FINDINGS: Lower chest: Motion artifact limits evaluation. Lung bases appear clear grossly. Hepatobiliary: No focal liver abnormality is seen. No gallstones, gallbladder wall thickening, or biliary dilatation. Pancreas: Unremarkable. No pancreatic ductal dilatation or  surrounding inflammatory changes. Spleen: Normal in size without focal abnormality. Adrenals/Urinary Tract: Adrenal glands are unremarkable. Kidneys are normal, without renal calculi, focal lesion, or hydronephrosis. Bladder wall is diffusely thickened, likely due to hypertrophy from outlet obstruction. Stomach/Bowel: Stomach, small bowel, and colon are not abnormally distended. Diffusely fluid-filled small bowel without wall thickening. Scattered stool in the colon. Colonic diverticula. No wall thickening or inflammatory changes. Vascular/Lymphatic: No significant vascular findings are present. No enlarged abdominal or pelvic lymph nodes. Reproductive: Prostate gland is enlarged, measuring 5.3 cm diameter. Other: No free air or free fluid in the abdomen. Abdominal wall musculature appears intact. Musculoskeletal: No acute or significant osseous findings. IMPRESSION: 1. Fluid-filled nondistended small bowel may indicate enteritis. No evidence of obstruction. 2. Enlarged prostate gland. 3. Diffuse bladder wall thickening likely due to hypertrophy from outlet obstruction. Electronically Signed   By: Lucienne Capers M.D.   On: 04/03/2020 04:00   DG Chest 2 View  Result Date: 04/06/2020 CLINICAL DATA:  New onset fever EXAM: CHEST - 2 VIEW COMPARISON:  04/02/2020 FINDINGS: Mild cardiomegaly. Low volume examination. Both lungs are clear. The visualized skeletal structures are unremarkable. IMPRESSION: Mild cardiomegaly. No acute abnormality of the lungs in low volume examination. No focal airspace opacity. Electronically Signed   By: Eddie Candle M.D.   On: 04/06/2020 08:01   DG Chest 2 View  Result Date: 04/02/2020 CLINICAL DATA:  Shortness of breath EXAM: CHEST - 2 VIEW COMPARISON:  None. FINDINGS: The heart size and mediastinal contours are within normal limits. Both lungs are clear. The visualized skeletal structures are unremarkable. IMPRESSION: No active cardiopulmonary disease. Electronically Signed   By:  Donavan Foil M.D.   On: 04/02/2020 23:11   US RENAL  Result Date: 04/04/2020 CLINICAL DATA:  Acute kidney injury. EXAM: RENAL / URINARY TRACT ULTRASOUND COMPLETE COMPARISON:  CT of the abdomen and pelvis on 04/03/2020 FINDINGS: Right Kidney: Renal measurements: 7.3 x 3.8 x 3.8 centimeters = volume: 56 mL. Renal parenchyma is hyperechoic. No focal renal mass. No hydronephrosis. Left Kidney: Renal measurements: 7.8 x 5.7 x 3.9 centimeters = volume: 92 mL. Renal parenchyma is hyperechoic.  No focal renal mass or hydronephrosis. Bladder: Foley catheter decompresses the urinary bladder. Bladder wall thickening identified, with decompressed urinary bladder. Bladder wall measures approximately 1.1 centimeters. Other: None. IMPRESSION: 1. Hyperechoic renal parenchyma and small kidneys bilaterally, consistent with chronic renal disease. 2. No focal renal mass or hydronephrosis. 3. Foley catheter decompresses the urinary bladder. 4. Bladder wall thickening, a nonspecific finding. This can be seen in the setting of urinary tract infection and/or urinary bladder outlet obstruction. Electronically Signed   By: Nolon Nations M.D.   On: 04/04/2020 10:10   Assessment/Plan The patient is a 49 year old male with a past medical history significant for autism, obsessive-compulsive disorder and hypertension found to have acute renal failure  1.  Acute renal failure: At this time, nephrology would like to initiate dialysis however the patient does not have adequate access.  We will place a PermCath to allow for dialysis to start immediately and in preparation if the patient's kidney function does not return for more permanent placement.  Father was at bedside.  Procedure, risks and benefits were explained to the father.  All questions were answered.  Father wishes to proceed and will sign the consent.  2.  Hypertension: Uncontrolled before admission. Currently being treated. Encouraged good control as its slows the  progression of atherosclerotic disease  3.  Autism: As per the patient's father was at the bedside states patient can become very irritated when his schedule change. We will plan on giving p.o. Versed before the procedure in an attempt to help keep patient calm Father is in agreement.  Discussed with Dr. Mayme Genta, PA-C  04/06/2020 10:14 AM  This note was created with Dragon medical transcription system.  Any error is purely unintentional.

## 2020-04-06 NOTE — H&P (View-Only) (Signed)
[facility] Consult Note  MRN : [id number]  Joseph Gilmore is a 49 y.o. (July 03, 1971) male who presents with chief complaint of  Chief Complaint  Patient presents with  . Emesis  . Shortness of Breath   History of Present Illness: Patient is a 49 year old male with a past medical history of autism, and hypertension presents to the emergency department in custody of his father. Patient's father endorses a history of vomiting and dyspnea today with occasional cough and excessive salivation. Patient's father states that this has been intermittent over the past 2 weeks.  He denies that his son has had any diarrhea.  States that the patient is followed by Dr. Brunetta Genera but that they have not seen him in a "long time, years". When he presented to the emergency department he had significantly elevated blood pressure of 221/136.  He had a CT scan of the abdomen pelvis which showed thickened bladder wall, large prostate consistent with bladder outlet obstruction.  However there was no hydronephrosis noted. His creatinine was 14.88 upon admission.  Currently creatinine 15.07.    Vascular surgery was consulted by Dr. Juleen China for insertion of a PermCath.  Current Facility-Administered Medications  Medication Dose Route Frequency Provider Last Rate Last Admin  . acetaminophen (TYLENOL) tablet 650 mg  650 mg Oral Q6H PRN Athena Masse, MD   650 mg at 04/06/20 0816   Or  . acetaminophen (TYLENOL) suppository 650 mg  650 mg Rectal Q6H PRN Athena Masse, MD      . amLODipine (NORVASC) tablet 10 mg  10 mg Oral Daily Para Skeans, MD   10 mg at 04/06/20 0816  . calcium acetate (PHOSLO) capsule 1,334 mg  1,334 mg Oral TID WC Kolluru, Sarath, MD   1,334 mg at 04/06/20 0816  . dutasteride (AVODART) capsule 0.5 mg  0.5 mg Oral Daily Florina Ou V, MD   0.5 mg at 04/06/20 0816  . heparin injection 5,000 Units  5,000 Units Subcutaneous Q8H Athena Masse, MD   5,000 Units at  04/06/20 0524  . hydrALAZINE (APRESOLINE) injection 10 mg  10 mg Intravenous Q4H PRN Para Skeans, MD   10 mg at 04/06/20 0524  . hydrALAZINE (APRESOLINE) tablet 10 mg  10 mg Oral Q8H Para Skeans, MD   10 mg at 04/06/20 0524  . LORazepam (ATIVAN) tablet 0.5 mg  0.5 mg Oral Q8H PRN Para Skeans, MD   0.5 mg at 04/03/20 1710  . magnesium sulfate IVPB 2 g 50 mL  2 g Intravenous Once Kolluru, Sarath, MD      . metoprolol tartrate (LOPRESSOR) tablet 12.5 mg  12.5 mg Oral BID Florina Ou V, MD   12.5 mg at 04/06/20 0816  . multivitamin (RENA-VIT) tablet 1 tablet  1 tablet Oral QHS Lavonia Dana, MD   1 tablet at 04/05/20 2101  . sodium bicarbonate 150 mEq in dextrose 5 % 1,000 mL infusion   Intravenous Continuous Para Skeans, MD 100 mL/hr at 04/06/20 0523 New Bag at 04/06/20 0523  . sodium chloride flush (NS) 0.9 % injection 10 mL  10 mL Intravenous Q12H Para Skeans, MD   10 mL at 04/06/20 0819  . tamsulosin (FLOMAX) capsule 0.4 mg  0.4 mg Oral Daily Para Skeans, MD   0.4 mg at 04/06/20 3976   Past Medical History:  Diagnosis Date  . Autism   . Murmur   . OCD (obsessive compulsive  disorder)    History reviewed. No pertinent surgical history.  Social History Social History   Tobacco Use  . Smoking status: Never Smoker  . Smokeless tobacco: Never Used  Vaping Use  . Vaping Use: Never used  Substance Use Topics  . Alcohol use: Never  . Drug use: Never   Family History History reviewed. No pertinent family history.  Father denies any family history of peripheral artery disease, venous disease or renal disease.  Allergies  Allergen Reactions  . Chlorhexidine    REVIEW OF SYSTEMS (Negative unless checked)  Constitutional: [] Weight loss  [] Fever  [] Chills Cardiac: [] Chest pain   [] Chest pressure   [] Palpitations   [] Shortness of breath when laying flat   [] Shortness of breath at rest   [] Shortness of breath with exertion. Vascular:  [] Pain in legs with walking   [] Pain in  legs at rest   [] Pain in legs when laying flat   [] Claudication   [] Pain in feet when walking  [] Pain in feet at rest  [] Pain in feet when laying flat   [] History of DVT   [] Phlebitis   [] Swelling in legs   [] Varicose veins   [] Non-healing ulcers Pulmonary:   [] Uses home oxygen   [] Productive cough   [] Hemoptysis   [] Wheeze  [] COPD   [] Asthma Neurologic:  [] Dizziness  [] Blackouts   [] Seizures   [] History of stroke   [] History of TIA  [] Aphasia   [] Temporary blindness   [] Dysphagia   [] Weakness or numbness in arms   [] Weakness or numbness in legs Musculoskeletal:  [] Arthritis   [] Joint swelling   [] Joint pain   [] Low back pain Hematologic:  [] Easy bruising  [] Easy bleeding   [] Hypercoagulable state   [x] Anemic  [] Hepatitis Gastrointestinal:  [] Blood in stool   [] Vomiting blood  [] Gastroesophageal reflux/heartburn   [] Difficulty swallowing. Genitourinary:  [x] Chronic kidney disease   [] Difficult urination  [] Frequent urination  [] Burning with urination   [] Blood in urine Skin:  [] Rashes   [] Ulcers   [] Wounds Psychological:  [x] History of anxiety   []  History of major depression.  Physical Examination  Vitals:   04/05/20 2048 04/06/20 0010 04/06/20 0428 04/06/20 0757  BP: (!) 167/100 (!) 166/91 (!) 171/96 (!) 171/90  Pulse: 99 (!) 102 98 100  Resp:   20 18  Temp:   (!) 100.9 F (38.3 C) (!) 101.1 F (38.4 C)  TempSrc:   Oral Oral  SpO2:   96% 97%  Weight:   112.1 kg   Height:       Body mass index is 37.57 kg/m. Gen:  Non-verbal, NAD Head: [place]/AT, No temporalis wasting. Prominent temp pulse not noted. Ear/Nose/Throat: Hearing grossly intact, nares w/o erythema or drainage, oropharynx w/o Erythema/Exudate Eyes: Sclera non-icteric, conjunctiva clear Neck: Trachea midline.  No JVD.  Pulmonary:  Good air movement, respirations not labored, equal bilaterally.  Cardiac: RRR, normal S1, S2. Vascular:  Vessel Right Left  Radial Palpable Palpable  Ulnar Palpable Palpable  Brachial Palpable  Palpable  Carotid Palpable, without bruit Palpable, without bruit  Aorta Not palpable N/A  Femoral Palpable Palpable  Popliteal Palpable Palpable  PT Palpable Palpable  DP Palpable Palpable   Gastrointestinal: soft, non-tender/non-distended. No guarding/reflex.  Musculoskeletal: M/S 5/5 throughout.  Extremities without ischemic changes.  No deformity or atrophy. No edema. Neurologic: Sensation grossly intact in extremities.  Symmetrical.  Speech is fluent. Motor exam as listed above. Psychiatric: Patient with hx of autism. Dermatologic: No rashes or ulcers noted.  No cellulitis or open wounds.  Lymph : No Cervical, Axillary, or Inguinal lymphadenopathy.  CBC Lab Results  Component Value Date   WBC 10.8 (H) 04/06/2020   HGB 8.4 (L) 04/06/2020   HCT 26.1 (L) 04/06/2020   MCV 86.1 04/06/2020   PLT 171 04/06/2020   BMET    Component Value Date/Time   NA 142 04/06/2020 0527   K 3.7 04/06/2020 0527   CL 101 04/06/2020 0527   CO2 26 04/06/2020 0527   GLUCOSE 106 (H) 04/06/2020 0527   BUN 69 (H) 04/06/2020 0527   CREATININE 13.38 (H) 04/06/2020 0527   CALCIUM 4.9 (LL) 04/06/2020 0527   GFRNONAA 4 (L) 04/06/2020 0527   GFRAA 4 (L) 04/06/2020 0527   Estimated Creatinine Clearance: 8.2 mL/min (A) (by C-G formula based on SCr of 13.38 mg/dL (H)).  COAG Lab Results  Component Value Date   INR 1.2 04/06/2020   Radiology CT ABDOMEN PELVIS WO CONTRAST  Result Date: 04/03/2020 CLINICAL DATA:  Nausea, vomiting, and shortness of breath EXAM: CT ABDOMEN AND PELVIS WITHOUT CONTRAST TECHNIQUE: Multidetector CT imaging of the abdomen and pelvis was performed following the standard protocol without IV contrast. COMPARISON:  None. FINDINGS: Lower chest: Motion artifact limits evaluation. Lung bases appear clear grossly. Hepatobiliary: No focal liver abnormality is seen. No gallstones, gallbladder wall thickening, or biliary dilatation. Pancreas: Unremarkable. No pancreatic ductal dilatation or  surrounding inflammatory changes. Spleen: Normal in size without focal abnormality. Adrenals/Urinary Tract: Adrenal glands are unremarkable. Kidneys are normal, without renal calculi, focal lesion, or hydronephrosis. Bladder wall is diffusely thickened, likely due to hypertrophy from outlet obstruction. Stomach/Bowel: Stomach, small bowel, and colon are not abnormally distended. Diffusely fluid-filled small bowel without wall thickening. Scattered stool in the colon. Colonic diverticula. No wall thickening or inflammatory changes. Vascular/Lymphatic: No significant vascular findings are present. No enlarged abdominal or pelvic lymph nodes. Reproductive: Prostate gland is enlarged, measuring 5.3 cm diameter. Other: No free air or free fluid in the abdomen. Abdominal wall musculature appears intact. Musculoskeletal: No acute or significant osseous findings. IMPRESSION: 1. Fluid-filled nondistended small bowel may indicate enteritis. No evidence of obstruction. 2. Enlarged prostate gland. 3. Diffuse bladder wall thickening likely due to hypertrophy from outlet obstruction. Electronically Signed   By: Lucienne Capers M.D.   On: 04/03/2020 04:00   DG Chest 2 View  Result Date: 04/06/2020 CLINICAL DATA:  New onset fever EXAM: CHEST - 2 VIEW COMPARISON:  04/02/2020 FINDINGS: Mild cardiomegaly. Low volume examination. Both lungs are clear. The visualized skeletal structures are unremarkable. IMPRESSION: Mild cardiomegaly. No acute abnormality of the lungs in low volume examination. No focal airspace opacity. Electronically Signed   By: Eddie Candle M.D.   On: 04/06/2020 08:01   DG Chest 2 View  Result Date: 04/02/2020 CLINICAL DATA:  Shortness of breath EXAM: CHEST - 2 VIEW COMPARISON:  None. FINDINGS: The heart size and mediastinal contours are within normal limits. Both lungs are clear. The visualized skeletal structures are unremarkable. IMPRESSION: No active cardiopulmonary disease. Electronically Signed   By:  Donavan Foil M.D.   On: 04/02/2020 23:11   US RENAL  Result Date: 04/04/2020 CLINICAL DATA:  Acute kidney injury. EXAM: RENAL / URINARY TRACT ULTRASOUND COMPLETE COMPARISON:  CT of the abdomen and pelvis on 04/03/2020 FINDINGS: Right Kidney: Renal measurements: 7.3 x 3.8 x 3.8 centimeters = volume: 56 mL. Renal parenchyma is hyperechoic. No focal renal mass. No hydronephrosis. Left Kidney: Renal measurements: 7.8 x 5.7 x 3.9 centimeters = volume: 92 mL. Renal parenchyma is hyperechoic.  No focal renal mass or hydronephrosis. Bladder: Foley catheter decompresses the urinary bladder. Bladder wall thickening identified, with decompressed urinary bladder. Bladder wall measures approximately 1.1 centimeters. Other: None. IMPRESSION: 1. Hyperechoic renal parenchyma and small kidneys bilaterally, consistent with chronic renal disease. 2. No focal renal mass or hydronephrosis. 3. Foley catheter decompresses the urinary bladder. 4. Bladder wall thickening, a nonspecific finding. This can be seen in the setting of urinary tract infection and/or urinary bladder outlet obstruction. Electronically Signed   By: Nolon Nations M.D.   On: 04/04/2020 10:10   Assessment/Plan The patient is a 49 year old male with a past medical history significant for autism, obsessive-compulsive disorder and hypertension found to have acute renal failure  1.  Acute renal failure: At this time, nephrology would like to initiate dialysis however the patient does not have adequate access.  We will place a PermCath to allow for dialysis to start immediately and in preparation if the patient's kidney function does not return for more permanent placement.  Father was at bedside.  Procedure, risks and benefits were explained to the father.  All questions were answered.  Father wishes to proceed and will sign the consent.  2.  Hypertension: Uncontrolled before admission. Currently being treated. Encouraged good control as its slows the  progression of atherosclerotic disease  3.  Autism: As per the patient's father was at the bedside states patient can become very irritated when his schedule change. We will plan on giving p.o. Versed before the procedure in an attempt to help keep patient calm Father is in agreement.  Discussed with Dr. Mayme Genta, PA-C  04/06/2020 10:14 AM  This note was created with Dragon medical transcription system.  Any error is purely unintentional.

## 2020-04-06 NOTE — Interval H&P Note (Signed)
History and Physical Interval Note:  04/06/2020 2:32 PM  Joseph Gilmore  has presented today for surgery, with the diagnosis of ESRD.  The various methods of treatment have been discussed with the patient and family. After consideration of risks, benefits and other options for treatment, the patient has consented to  Procedure(s): DIALYSIS/PERMA CATHETER INSERTION (N/A) as a surgical intervention.  The patient's history has been reviewed, patient examined, no change in status, stable for surgery.  I have reviewed the patient's chart and labs.  Questions were answered to the patient's satisfaction.     [name]

## 2020-04-06 NOTE — Progress Notes (Signed)
   04/06/20 2239  Assess: MEWS Score  Temp (!) 102.3 F (39.1 C)  BP (!) 164/98  Pulse Rate 96  Resp 18  SpO2 99 %  O2 Device Room Air  Assess: MEWS Score  MEWS Temp 2  MEWS Systolic 0  MEWS Pulse 0  MEWS RR 0  MEWS LOC 0  MEWS Score 2  MEWS Score Color Yellow  Assess: if the MEWS score is Yellow or Red  Were vital signs taken at a resting state? Yes  Focused Assessment No change from prior assessment  Early Detection of Sepsis Score *See Row Information* Medium  MEWS guidelines implemented *See Row Information* No, previously yellow, continue vital signs every 4 hours  Treat  MEWS Interventions Other (Comment) (too early to give meds)  Pain Scale 0-10  Pain Score 0  Take Vital Signs  Increase Vital Sign Frequency  Yellow: Q 2hr X 2 then Q 4hr X 2, if remains yellow, continue Q 4hrs  Document  Patient Outcome Other (Comment) (no change pt has HTN. and fever)

## 2020-04-06 NOTE — Progress Notes (Signed)
Patient ID: Joseph Gilmore, male   DOB: October 07, 1970, 49 y.o.   MRN: [id number] [facility] PROGRESS NOTE  Joseph Gilmore:[id number] DOB: [birth year] DOA: 04/03/2020 PCP: [facility]  HPI/Subjective: Patient seen this morning.  He has been having some fever but offers no complaints.  No runny nose no postnasal drip.  No sore throat.  No cough.  No diarrhea.  No abdominal pain.  Objective: Vitals:   04/06/20 0757 04/06/20 1146  BP: (!) 171/90 134/88  Pulse: 100 94  Resp: 18 18  Temp: (!) 101.1 F (38.4 C) (!) 100.9 F (38.3 C)  SpO2: 97% 97%    Intake/Output Summary (Last 24 hours) at 04/06/2020 1438 Last data filed at 04/06/2020 1155 Gross per 24 hour  Intake --  Output 1200 ml  Net -1200 ml   Filed Weights   04/04/20 0404 04/05/20 0504 04/06/20 0428  Weight: 109.6 kg 109.3 kg 112.1 kg    ROS: Review of Systems  Constitutional: Positive for fever. Negative for chills.  Respiratory: Negative for cough and shortness of breath.   Cardiovascular: Negative for chest pain.  Gastrointestinal: Negative for abdominal pain, constipation, diarrhea, nausea and vomiting.  Musculoskeletal: Negative for joint pain.   Exam: Physical Exam HENT:     Nose: No mucosal edema.     Mouth/Throat:     Pharynx: No oropharyngeal exudate.  Eyes:     General: Lids are normal.     Conjunctiva/sclera: Conjunctivae normal.     Pupils: Pupils are equal, round, and reactive to light.  Cardiovascular:     Rate and Rhythm: Normal rate and regular rhythm.     Heart sounds: Normal heart sounds, S1 normal and S2 normal.  Pulmonary:     Breath sounds: No decreased breath sounds, wheezing, rhonchi or rales.  Abdominal:     Palpations: Abdomen is soft.     Tenderness: There is no abdominal tenderness.  Musculoskeletal:     Right ankle: No swelling.     Left ankle: No swelling.  Skin:    General: Skin is warm.     Findings: No rash.     Comments: Nurse this afternoon noticed some blisters  on his hand but patient was taken down to vascular lab.  Neurological:     Mental Status: He is alert and oriented to person, place, and time.       Data Reviewed: Basic Metabolic Panel: Recent Labs  Lab 04/03/20 0248 04/03/20 0721 04/03/20 1809 04/04/20 0916 04/05/20 0825 04/06/20 0527  NA 143 140  --  141 139 142  K 4.1 4.0  --  3.6 3.2* 3.7  CL 110 111  --  107 102 101  CO2 19* 14*  --  16* 21* 26  GLUCOSE 96 108*  --  102* 102* 106*  BUN 73* 74*  --  72* 69* 69*  CREATININE 15.20* 15.07*  --  14.15* 14.11* 13.38*  CALCIUM 4.1* 4.4*  --  4.6* 4.8* 4.9*  MG  --  1.5*  --  1.5*  --  1.5*  PHOS  --   --  6.3*  --  7.0*  --    Liver Function Tests: Recent Labs  Lab 04/02/20 2248 04/03/20 0248 04/04/20 0916 04/05/20 0825 04/06/20 0527  AST 51* 48* 22  --  22  ALT 69* 66* 36  --  28  ALKPHOS 58 62 51  --  48  BILITOT 0.5 0.8 0.8  --  0.7  PROT 7.0 7.6  6.4*  --  6.2*  ALBUMIN 3.8 3.9 3.4* 3.1* 3.3*   Recent Labs  Lab 04/02/20 2248 04/04/20 1357  LIPASE 90* 71*   CBC: Recent Labs  Lab 04/02/20 2248 04/03/20 0721 04/04/20 0916 04/05/20 0825 04/06/20 0527  WBC 9.8 10.9* 10.6* 9.6 10.8*  NEUTROABS  --   --  8.2* 6.8 7.4  HGB 10.1* 9.9* 9.3* 8.7* 8.4*  HCT 30.3* 29.4* 27.5* 25.7* 26.1*  MCV 83.2 83.5 81.8 82.6 86.1  PLT 180 189 163 162 171   Cardiac Enzymes: Recent Labs  Lab 04/03/20 0721 04/04/20 1357 04/06/20 0730  CKTOTAL 2,732* 2,334* 2,023*     Recent Results (from the past 240 hour(s))  SARS Coronavirus 2 by RT PCR (hospital order, performed in [facility] hospital lab) Nasopharyngeal Nasopharyngeal Swab     Status: None   Collection Time: 04/03/20  3:06 AM   Specimen: Nasopharyngeal Swab  Result Value Ref Range Status   SARS Coronavirus 2 NEGATIVE NEGATIVE Final    Comment: (NOTE) SARS-CoV-2 target nucleic acids are NOT DETECTED.  The SARS-CoV-2 RNA is generally detectable in upper and lower respiratory specimens during the acute  phase of infection. The lowest concentration of SARS-CoV-2 viral copies this assay can detect is 250 copies / mL. A negative result does not preclude SARS-CoV-2 infection and should not be used as the sole basis for treatment or other patient management decisions.  A negative result may occur with improper specimen collection / handling, submission of specimen other than nasopharyngeal swab, presence of viral mutation(s) within the areas targeted by this assay, and inadequate number of viral copies (<250 copies / mL). A negative result must be combined with clinical observations, patient history, and epidemiological information.  Fact Sheet for Patients:   StrictlyIdeas.no  Fact Sheet for Healthcare Providers: BankingDealers.co.za  This test is not yet approved or  cleared by the [place] FDA and has been authorized for detection and/or diagnosis of SARS-CoV-2 by FDA under an Emergency Use Authorization (EUA).  This EUA will remain in effect (meaning this test can be used) for the duration of the COVID-19 declaration under Section 564(b)(1) of the Act, 21 U.S.C. section 360bbb-3(b)(1), unless the authorization is terminated or revoked sooner.  Performed at [facility], [address] [place], [place] [address]   Culture, blood (Routine X 2) w Reflex to ID Panel     Status: None (Preliminary result)   Collection Time: 04/05/20  5:08 PM   Specimen: BLOOD  Result Value Ref Range Status   Specimen Description BLOOD LAC  Final   Special Requests   Final    BOTTLES DRAWN AEROBIC AND ANAEROBIC Blood Culture adequate volume   Culture   Final    NO GROWTH < 12 HOURS Performed at [facility], [address] [place], [place] [address]    Report Status PENDING  Incomplete     Studies: DG Chest 2 View  Result Date: 04/06/2020 CLINICAL DATA:  New onset fever EXAM: CHEST - 2 VIEW COMPARISON:  04/02/2020 FINDINGS:  Mild cardiomegaly. Low volume examination. Both lungs are clear. The visualized skeletal structures are unremarkable. IMPRESSION: Mild cardiomegaly. No acute abnormality of the lungs in low volume examination. No focal airspace opacity. Electronically Signed   By: Eddie Candle M.D.   On: 04/06/2020 08:01    Scheduled Meds: . [MAR Hold] amLODipine  10 mg Oral Daily  . [MAR Hold] calcium acetate  1,334 mg Oral TID WC  . [MAR Hold] dutasteride  0.5 mg Oral Daily  . [MAR Hold] heparin  5,000 Units Subcutaneous Q8H  . [MAR Hold] hydrALAZINE  10 mg Oral Q8H  . [MAR Hold] metoprolol tartrate  12.5 mg Oral BID  . [MAR Hold] multivitamin  1 tablet Oral QHS  . [MAR Hold] sodium chloride flush  10 mL Intravenous Q12H  . [MAR Hold] tamsulosin  0.4 mg Oral Daily   Continuous Infusions: . [MAR Hold] sodium chloride 250 mL (04/06/20 1137)  . sodium chloride    . [START ON 04/07/2020]  ceFAZolin (ANCEF) IV    . cefTRIAXone (ROCEPHIN)  IV      Assessment/Plan:  1. Acute kidney injury with rhabdomyolysis.  Vascular surgery to put in a catheter today for dialysis.  Likely a component of obstructive uropathy.  Foley catheter.  Patient was on bicarb drip 2. Fever.  Unclear etiology.  Looking back at CT scan may have shown an enteritis.  I will give empiric Rocephin at this point.  No complaints of diarrhea.  Nurse noted some blistering on his hand.  Another set of blood cultures ordered today.  Chest x-ray negative.  Patient just had a Foley catheter placed, I am not going to send the urine. 3. BPH.  Foley catheter.  Patient on Flomax and Proscar. 4. Elevated PSA at 26.65 will need urology follow-up as outpatient 5. Anemia likely of chronic disease 6. Essential hypertension on Norvasc and metoprolol      Code Status:     Code Status Orders  (From admission, onward)         Start     Ordered   04/03/20 0443  Full code  Continuous        04/03/20 0447        Code Status History    This  patient has a current code status but no historical code status.   Advance Care Planning Activity     Family Communication: Spoke with father on the phone Disposition Plan: Status is: Inpatient  Dispo: The patient is from: Home              Anticipated d/c is to: Home              Anticipated d/c date is: Patient will need quite a few days here secondary to just starting dialysis              Patient currently having fever of unclear etiology and needing to start dialysis.  Consultants:  Nephrology  Urology  Vascular surgery  Antibiotics:  Start Rocephin  Time spent: 28 minutes  [place]

## 2020-04-07 ENCOUNTER — Encounter: Payer: Self-pay | Admitting: Vascular Surgery

## 2020-04-07 DIAGNOSIS — N401 Enlarged prostate with lower urinary tract symptoms: Secondary | ICD-10-CM | POA: Diagnosis not present

## 2020-04-07 DIAGNOSIS — N138 Other obstructive and reflux uropathy: Secondary | ICD-10-CM

## 2020-04-07 DIAGNOSIS — R509 Fever, unspecified: Secondary | ICD-10-CM | POA: Diagnosis not present

## 2020-04-07 DIAGNOSIS — N189 Chronic kidney disease, unspecified: Secondary | ICD-10-CM

## 2020-04-07 DIAGNOSIS — M6282 Rhabdomyolysis: Secondary | ICD-10-CM

## 2020-04-07 DIAGNOSIS — I129 Hypertensive chronic kidney disease with stage 1 through stage 4 chronic kidney disease, or unspecified chronic kidney disease: Secondary | ICD-10-CM

## 2020-04-07 DIAGNOSIS — F840 Autistic disorder: Secondary | ICD-10-CM

## 2020-04-07 DIAGNOSIS — R531 Weakness: Secondary | ICD-10-CM

## 2020-04-07 DIAGNOSIS — N179 Acute kidney failure, unspecified: Secondary | ICD-10-CM | POA: Diagnosis not present

## 2020-04-07 LAB — CBC WITH DIFFERENTIAL/PLATELET
Abs Immature Granulocytes: 0.08 10*3/uL — ABNORMAL HIGH (ref 0.00–0.07)
Basophils Absolute: 0.1 10*3/uL (ref 0.0–0.1)
Basophils Relative: 1 %
Eosinophils Absolute: 0.2 10*3/uL (ref 0.0–0.5)
Eosinophils Relative: 2 %
HCT: 26.7 % — ABNORMAL LOW (ref 39.0–52.0)
Hemoglobin: 8.9 g/dL — ABNORMAL LOW (ref 13.0–17.0)
Immature Granulocytes: 1 %
Lymphocytes Relative: 11 %
Lymphs Abs: 1.4 10*3/uL (ref 0.7–4.0)
MCH: 28.4 pg (ref 26.0–34.0)
MCHC: 33.3 g/dL (ref 30.0–36.0)
MCV: 85.3 fL (ref 80.0–100.0)
Monocytes Absolute: 1.5 10*3/uL — ABNORMAL HIGH (ref 0.1–1.0)
Monocytes Relative: 11 %
Neutro Abs: 9.6 10*3/uL — ABNORMAL HIGH (ref 1.7–7.7)
Neutrophils Relative %: 74 %
Platelets: 168 10*3/uL (ref 150–400)
RBC: 3.13 MIL/uL — ABNORMAL LOW (ref 4.22–5.81)
RDW: 13.5 % (ref 11.5–15.5)
WBC: 12.8 10*3/uL — ABNORMAL HIGH (ref 4.0–10.5)
nRBC: 0 % (ref 0.0–0.2)

## 2020-04-07 LAB — PROTEIN ELECTROPHORESIS, SERUM
A/G Ratio: 1.1 (ref 0.7–1.7)
Albumin ELP: 3.2 g/dL (ref 2.9–4.4)
Alpha-1-Globulin: 0.2 g/dL (ref 0.0–0.4)
Alpha-2-Globulin: 1 g/dL (ref 0.4–1.0)
Beta Globulin: 0.7 g/dL (ref 0.7–1.3)
Gamma Globulin: 0.9 g/dL (ref 0.4–1.8)
Globulin, Total: 2.8 g/dL (ref 2.2–3.9)
Total Protein ELP: 6 g/dL (ref 6.0–8.5)

## 2020-04-07 LAB — PROTEIN ELECTRO, RANDOM URINE
Albumin ELP, Urine: 39.5 %
Alpha-1-Globulin, U: 6.1 %
Alpha-2-Globulin, U: 9.2 %
Beta Globulin, U: 21.5 %
Gamma Globulin, U: 23.7 %
Total Protein, Urine: 44.3 mg/dL

## 2020-04-07 LAB — COMPREHENSIVE METABOLIC PANEL
ALT: 29 U/L (ref 0–44)
AST: 31 U/L (ref 15–41)
Albumin: 3.1 g/dL — ABNORMAL LOW (ref 3.5–5.0)
Alkaline Phosphatase: 44 U/L (ref 38–126)
Anion gap: 15 (ref 5–15)
BUN: 50 mg/dL — ABNORMAL HIGH (ref 6–20)
CO2: 25 mmol/L (ref 22–32)
Calcium: 5.6 mg/dL — CL (ref 8.9–10.3)
Chloride: 99 mmol/L (ref 98–111)
Creatinine, Ser: 10.59 mg/dL — ABNORMAL HIGH (ref 0.61–1.24)
GFR calc Af Amer: 6 mL/min — ABNORMAL LOW (ref >60)
GFR calc non Af Amer: 5 mL/min — ABNORMAL LOW (ref >60)
Glucose, Bld: 100 mg/dL — ABNORMAL HIGH (ref 70–99)
Potassium: 4 mmol/L (ref 3.5–5.1)
Sodium: 139 mmol/L (ref 135–145)
Total Bilirubin: 0.9 mg/dL (ref 0.3–1.2)
Total Protein: 6.2 g/dL — ABNORMAL LOW (ref 6.5–8.1)

## 2020-04-07 LAB — PARATHYROID HORMONE, INTACT (NO CA): PTH: 99 pg/mL — ABNORMAL HIGH (ref 15–65)

## 2020-04-07 LAB — HEPATITIS C VRS RNA DETECT BY PCR-QUAL: Hepatitis C Vrs RNA by PCR-Qual: NEGATIVE

## 2020-04-07 LAB — GLOMERULAR BASEMENT MEMBRANE ANTIBODIES: GBM Ab: 2 U (ref 0–20)

## 2020-04-07 LAB — LACTIC ACID, PLASMA
Lactic Acid, Venous: 0.8 mmol/L (ref 0.5–1.9)
Lactic Acid, Venous: 0.8 mmol/L (ref 0.5–1.9)

## 2020-04-07 LAB — MAGNESIUM: Magnesium: 1.6 mg/dL — ABNORMAL LOW (ref 1.7–2.4)

## 2020-04-07 LAB — PROCALCITONIN: Procalcitonin: 0.77 ng/mL

## 2020-04-07 LAB — C4 COMPLEMENT: Complement C4, Body Fluid: 18 mg/dL (ref 12–38)

## 2020-04-07 LAB — C3 COMPLEMENT: C3 Complement: 121 mg/dL (ref 82–167)

## 2020-04-07 MED ORDER — HYDRALAZINE HCL 20 MG/ML IJ SOLN
10.0000 mg | Freq: Once | INTRAMUSCULAR | Status: AC
  Administered 2020-04-07: 10 mg via INTRAVENOUS
  Filled 2020-04-07: qty 1

## 2020-04-07 MED ORDER — HYDRALAZINE HCL 20 MG/ML IJ SOLN: 10.0000 mg | Freq: Once | INTRAMUSCULAR | Status: DC

## 2020-04-07 MED ORDER — VANCOMYCIN VARIABLE DOSE PER UNSTABLE RENAL FUNCTION (PHARMACIST DOSING): Status: DC

## 2020-04-07 MED ORDER — VANCOMYCIN HCL IN DEXTROSE 1-5 GM/200ML-% IV SOLN
1000.0000 mg | Freq: Once | INTRAVENOUS | Status: AC
  Administered 2020-04-07: 1000 mg via INTRAVENOUS
  Filled 2020-04-07: qty 200

## 2020-04-07 MED ORDER — VANCOMYCIN HCL 2000 MG/400ML IV SOLN
2000.0000 mg | Freq: Once | INTRAVENOUS | Status: AC
  Administered 2020-04-07: 2000 mg via INTRAVENOUS
  Filled 2020-04-07: qty 400

## 2020-04-07 MED ORDER — SODIUM CHLORIDE 0.9 % IV SOLN
2.0000 g | INTRAVENOUS | Status: DC
  Administered 2020-04-07 – 2020-04-09 (×3): 2 g via INTRAVENOUS
  Filled 2020-04-07 (×2): qty 20
  Filled 2020-04-07: qty 2
  Filled 2020-04-07 (×2): qty 20
  Filled 2020-04-07: qty 2

## 2020-04-07 MED ORDER — MAGNESIUM OXIDE 400 (241.3 MG) MG PO TABS
400.0000 mg | ORAL_TABLET | Freq: Every day | ORAL | Status: DC
  Administered 2020-04-07 – 2020-04-14 (×8): 400 mg via ORAL
  Filled 2020-04-07 (×9): qty 1

## 2020-04-07 MED ORDER — METOPROLOL TARTRATE 50 MG PO TABS
100.0000 mg | ORAL_TABLET | Freq: Two times a day (BID) | ORAL | Status: DC
  Administered 2020-04-07 – 2020-04-14 (×13): 100 mg via ORAL
  Filled 2020-04-07 (×14): qty 2

## 2020-04-07 MED ORDER — MAGNESIUM SULFATE 2 GM/50ML IV SOLN
2.0000 g | Freq: Once | INTRAVENOUS | Status: AC
  Administered 2020-04-07: 2 g via INTRAVENOUS
  Filled 2020-04-07: qty 50

## 2020-04-07 NOTE — Progress Notes (Addendum)
Ca is 5.6 NP made aware of critical.

## 2020-04-07 NOTE — Progress Notes (Signed)
Pharmacy Antibiotic Note  Joseph Gilmore is a 49 y.o. male admitted on 04/03/2020 with N/V and SOB w/ h/o autism and OCD, presents w/ elevate SCr of 15.20 without a previous baseline, also notable hypocalcemia and diffuse bladder wall thickening s/t bladder outlet obstruction s/t enlarged prostate gland w/ PSA of 26.65. Patient is s/p perm-cath placement for anticipation of HD d/t AKI w/o h/o of kidney disease.   Tmax 102.33F, tachypneic, w/ increase in WBC 10.8 >> 13.9, placed on ceftriaxone 1g for possible enteritis, now pharmacy has been consulted for vancomycin/ceftriaxone dosing for broad-spectrum abx therapy in a possible early sepsis patient meeting 3/4 SIRS. PCT, LA, CRP, BCX/UCx pending.  Plan: Patient has received vanc 2g IV load and ceftriaxone is being increased to 2g IV daily.  Since patient is in AKI w/o baseline will dose per random levels and check am labs to reassess renal function.  Goal random < 20 mcg/mL  Height: 5\' 8"  (172.7 cm) Weight: 113.9 kg (251 lb 1.6 oz) IBW/kg (Calculated) : 68.4  Temp (24hrs), Avg:100.8 F (38.2 C), Min:98.6 F (37 C), Max:102.3 F (39.1 C)  Recent Labs  Lab 04/02/20 2248 04/03/20 0248 04/03/20 0721 04/04/20 0916 04/05/20 0825 04/06/20 0527 04/06/20 2055  WBC   < >  --  10.9* 10.6* 9.6 10.8* 13.9*  CREATININE  --  15.20* 15.07* 14.15* 14.11* 13.38*  --    < > = values in this interval not displayed.    Estimated Creatinine Clearance: 8.3 mL/min (A) (by C-G formula based on SCr of 13.38 mg/dL (H)).    Allergies  Allergen Reactions  . Chlorhexidine     Thank you for allowing pharmacy to be a part of this patient's care.  Tobie Lords, PharmD, BCPS Clinical Pharmacist 04/07/2020 3:30 AM

## 2020-04-07 NOTE — Progress Notes (Addendum)
    BRIEF OVERNIGHT PROGRESS REPORT   SUBJECTIVE: Patient spiked a fever of 102.3 associated with tachycardia, tachypnea and hypertension.  OBJECTIVE:On bedside assessment , he was febrile with blood pressure 170/96 mm Hg and pulse rate102 beats/min. There were no focal neurological deficits; he is alert but difficult to assess orientation given baseline Autism.  BRIEF PATIENT DESCRIPTION: 49 y.o. male with PMH of autism and OCD admitted on 04/03/2020 with N/V, fever and SOB noted to have elevate SCr of 15.20 without a previous baseline, also notable hypocalcemia and diffuse bladder wall thickening s/t bladder outlet obstruction s/t enlarged prostate gland w/ PSA of 26.65. Patient is s/p perm-cath placement for anticipation of HD d/t AKI w/o h/o of kidney disease. Now being treated for possible sepsis of unknown source.  ASSESSMENT AND PLAN:  Sepsis - Patient meets SIRS criteria,Heart Rate 102 beats/minute, Respiratory Rate 25 breaths/minute,Temperature 102.3 with Leukocytosis.  - Unknown source, unclear if worsening fever and Leukocytosis could be post -op related? - High risk for developing septic shock - At least 2x IV access, 18 gauge or larger if possible - IVF resuscitation to maintain MAP>65 - Chest x-ray shows no acute abnormality - Check procalcitonin, Lactic acid and CRP - UA shows no evidence of UTI - Urine cultures pending - Blood cultures pending - Monitor fever curve - Empiric abx with Ceftriaxone. Will increase Ceftriaxone to 2g and add Vancomycin to broaden coverage pending further work up as above. Consider de-escalating if no true source identified. - Will check 2D Echocardiogram - Consider ID consult as appropriate if fever persistent       Rufina Falco, RN, BSN, MSN, DNP, CCRN,FNP-C  [facility] Nurse Practitioner  [facility]

## 2020-04-07 NOTE — Consult Note (Addendum)
NAME: Joseph Gilmore  DOB: 01-03-1971  MRN: [id number]  Date/Time: 04/07/2020 1:58 PM  REQUESTING PROVIDER: Dr.Wieting Subjective:  REASON FOR CONSULT: Fever No history available from patient.  Chart reviewed.  Spoke to his father? Joseph Gilmore is a 49 y.o. male with autism presents with shortness of breath and vomiting.  His dad brought him to the ED on 04/03/2020. As per his father he was vomiting for a week before he came in.dad was checking his temp and it was normal. He walks 2-3 miles from his residence to his dad every day. He walked on 04/02/20 In the ED his blood pressure was 221/136 with a temperature of 99.5.  Blood revealed a creatinine of 14.88 with bicarb of 14 and potassium of 3.9.  AST was 51 and ALT was 69.  Chest x-ray showed no acute changes.  CT abdomen pelvis showed enlarged prostate with thickened bladder wall consistent with bladder outlet obstruction. UA done on 04/03/2020 showed 0-5 WBC.  Patient started to spike high fever and was started on ceftriaxone and vancomycin after blood culture and urine culture was sent on 8/8 and 04/06/2020. He had placement of dialysis catheter on 04/06/2020. Early this morning had a spike of 102.3 with tachycardia tachypnea and hypertension.  Vancomycin was added to the ceftriaxone.  Lives with his cousin.   Past Medical History:  Diagnosis Date  . Autism   . Murmur   . OCD (obsessive compulsive disorder)     Past Surgical History:  Procedure Laterality Date  . 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 Marital status: Single    Spouse name: Not on file  . Number of children: Not on file  . Years of education: Not on file  . Highest education level: Not on file   Occupational History  . Not on file  Tobacco Use  . Smoking status: Never Smoker  . Smokeless tobacco: Never Used  Vaping Use  . Vaping Use: Never used  Substance and Sexual Activity  . Alcohol use: Never  . Drug use: Never  . Sexual activity: Not on file  Other Topics Concern  . Not on file  Social History Narrative  . Not on file   Social Determinants of Health   Financial Resource Strain:   . Difficulty of Paying Living Expenses:   Food Insecurity:   . Worried About Charity fundraiser in the Last Year:   . Arboriculturist in the Last Year:   Transportation Needs:   . Film/video editor (Medical):   Marland Kitchen Lack of Transportation (Non-Medical):   Physical Activity:   . Days of Exercise per Week:   . Minutes of Exercise per Session:   Stress:   . Feeling of Stress :   Social Connections:   . Frequency of Communication with Friends and Family:   . Frequency of Social Gatherings with Friends and Family:   . Attends Religious Services:   . Active Member of Clubs or Organizations:   . Attends Archivist Meetings:   Marland Kitchen Marital Status:   Intimate Partner Violence:   . Fear of Current or Ex-Partner:   . Emotionally Abused:   Marland Kitchen Physically Abused:   . Sexually Abused:  History reviewed. No pertinent family history. Allergies  Allergen Reactions  . Chlorhexidine     ? Current Facility-Administered Medications  Medication Dose Route Frequency Provider Last Rate Last Admin  . 0.9 %  sodium chloride infusion   Intravenous PRN Algernon Huxley, MD   Stopped at 04/07/20 0320  . acetaminophen (TYLENOL) tablet 650 mg  650 mg Oral Q6H PRN Algernon Huxley, MD   650 mg at 04/07/20 1357   Or  . acetaminophen (TYLENOL) suppository 650 mg  650 mg Rectal Q6H PRN Algernon Huxley, MD      . amLODipine (NORVASC) tablet 10 mg  10 mg Oral Daily Algernon Huxley, MD   10 mg at 04/07/20 1210  . calcium acetate (PHOSLO) capsule 1,334 mg  1,334 mg Oral TID WC Algernon Huxley, MD   1,334 mg at  04/07/20 1219  . cefTRIAXone (ROCEPHIN) 2 g in sodium chloride 0.9 % 100 mL IVPB  2 g Intravenous Q24H Ouma, Bing Neighbors, NP      . dutasteride (AVODART) capsule 0.5 mg  0.5 mg Oral Daily Algernon Huxley, MD   0.5 mg at 04/07/20 1219  . heparin injection 5,000 Units  5,000 Units Subcutaneous Q8H Algernon Huxley, MD   5,000 Units at 04/07/20 1302  . hydrALAZINE (APRESOLINE) injection 10 mg  10 mg Intravenous Q4H PRN Algernon Huxley, MD   10 mg at 04/07/20 0457  . hydrALAZINE (APRESOLINE) tablet 10 mg  10 mg Oral Q8H Algernon Huxley, MD   10 mg at 04/07/20 1302  . HYDROmorphone (DILAUDID) injection 1 mg  1 mg Intravenous Once PRN Algernon Huxley, MD      . LORazepam (ATIVAN) tablet 0.5 mg  0.5 mg Oral Q8H PRN Algernon Huxley, MD   0.5 mg at 04/03/20 1710  . magnesium oxide (MAG-OX) tablet 400 mg  400 mg Oral Daily Loletha Grayer, MD   400 mg at 04/07/20 1211  . metoprolol tartrate (LOPRESSOR) tablet 100 mg  100 mg Oral BID Kolluru, Sarath, MD      . multivitamin (RENA-VIT) tablet 1 tablet  1 tablet Oral QHS Algernon Huxley, MD   1 tablet at 04/06/20 2043  . ondansetron (ZOFRAN) injection 4 mg  4 mg Intravenous Q6H PRN Algernon Huxley, MD      . sodium chloride flush (NS) 0.9 % injection 10 mL  10 mL Intravenous Q12H Algernon Huxley, MD   10 mL at 04/07/20 1220  . tamsulosin (FLOMAX) capsule 0.4 mg  0.4 mg Oral Daily Algernon Huxley, MD   0.4 mg at 04/07/20 1210  . vancomycin (VANCOCIN) IVPB 1000 mg/200 mL premix  1,000 mg Intravenous Once Kolluru, Sarath, MD 200 mL/hr at 04/07/20 1303 1,000 mg at 04/07/20 1303  . vancomycin variable dose per unstable renal function (pharmacist dosing)   Does not apply See admin instructions Lang Snow, NP         Abtx:  Anti-infectives (From admission, onward)   Start     Dose/Rate Route Frequency Ordered Stop   04/07/20 1800  cefTRIAXone (ROCEPHIN) 2 g in sodium chloride 0.9 % 100 mL IVPB     Discontinue     2 g 200 mL/hr over 30 Minutes Intravenous Every 24 hours  04/07/20 0245     04/07/20 1200  vancomycin (VANCOCIN) IVPB 1000 mg/200 mL premix     Discontinue     1,000 mg 200 mL/hr over 60 Minutes Intravenous  Once  04/07/20 1017     04/07/20 0300  vancomycin (VANCOREADY) IVPB 2000 mg/400 mL        2,000 mg 200 mL/hr over 120 Minutes Intravenous  Once 04/07/20 0245 04/07/20 0520   04/07/20 0244  vancomycin variable dose per unstable renal function (pharmacist dosing)     Discontinue      Does not apply See admin instructions 04/07/20 0245     04/07/20 0000  ceFAZolin (ANCEF) IVPB 1 g/50 mL premix       Note to Pharmacy: To be given in specials   1 g 100 mL/hr over 30 Minutes Intravenous  Once 04/06/20 1348 04/06/20 1603   04/06/20 1800  cefTRIAXone (ROCEPHIN) 1 g in sodium chloride 0.9 % 100 mL IVPB  Status:  Discontinued        1 g 200 mL/hr over 30 Minutes Intravenous Every 24 hours 04/06/20 1436 04/07/20 0245      REVIEW OF SYSTEMS:  Na Pt says he is fine for all questions Pertinent Positives include : Objective:  VITALS:  BP (!) 145/84 (BP Location: Left Arm)   Pulse (!) 103   Temp (!) 101.7 F (38.7 C) (Oral)   Resp 20   Ht 5\' 8"  (1.727 m)   Wt 114.7 kg   SpO2 99%   BMI 38.45 kg/m  PHYSICAL EXAM:  General: Alert, cooperative, no distress, appears stated age. Not oriented in place or time  Head: Normocephalic, without obvious abnormality, atraumatic. Eyes: Conjunctivae clear, anicteric sclerae. Pupils are equal ENT Nares normal. No drainage or sinus tenderness. Lips, mucosa, and tongue normal. No Thrush Neck: Supple, symmetrical, no adenopathy, thyroid: non tender no carotid bruit and no JVD. Lungs:CTA Heart:s1s2 Rt IJ permacath Abdomen: Soft, non-tender,not distended. Bowel sounds normal. No masses Extremities: rt forearm- blisters    atraumatic, no cyanosis. No edema. No clubbing Skin: No rashes or lesions. Or bruising Lymph: Cervical, supraclavicular normal. Neurologic: Grossly non-focal Pertinent Labs Lab  Results CBC    Component Value Date/Time   WBC 12.8 (H) 04/07/2020 0413   RBC 3.13 (L) 04/07/2020 0413   HGB 8.9 (L) 04/07/2020 0413   HCT 26.7 (L) 04/07/2020 0413   PLT 168 04/07/2020 0413   MCV 85.3 04/07/2020 0413   MCH 28.4 04/07/2020 0413   MCHC 33.3 04/07/2020 0413   RDW 13.5 04/07/2020 0413   LYMPHSABS 1.4 04/07/2020 0413   MONOABS 1.5 (H) 04/07/2020 0413   EOSABS 0.2 04/07/2020 0413   BASOSABS 0.1 04/07/2020 0413    CMP Latest Ref Rng & Units 04/07/2020 04/06/2020 04/05/2020  Glucose 70 - 99 mg/dL 100(H) 106(H) 102(H)  BUN 6 - 20 mg/dL 50(H) 69(H) 69(H)  Creatinine 0.61 - 1.24 mg/dL 10.59(H) 13.38(H) 14.11(H)  Sodium 135 - 145 mmol/L 139 142 139  Potassium 3.5 - 5.1 mmol/L 4.0 3.7 3.2(L)  Chloride 98 - 111 mmol/L 99 101 102  CO2 22 - 32 mmol/L 25 26 21(L)  Calcium 8.9 - 10.3 mg/dL 5.6(LL) 4.9(LL) 4.8(LL)  Total Protein 6.5 - 8.1 g/dL 6.2(L) 6.2(L) -  Total Bilirubin 0.3 - 1.2 mg/dL 0.9 0.7 -  Alkaline Phos 38 - 126 U/L 44 48 -  AST 15 - 41 U/L 31 22 -  ALT 0 - 44 U/L 29 28 -      Microbiology: Recent Results (from the past 240 hour(s))  SARS Coronavirus 2 by RT PCR (hospital order, performed in [facility] hospital lab) Nasopharyngeal Nasopharyngeal Swab     Status: None   Collection Time: 04/03/20  3:06 AM   Specimen: Nasopharyngeal Swab  Result Value Ref Range Status   SARS Coronavirus 2 NEGATIVE NEGATIVE Final    Comment: (NOTE) SARS-CoV-2 target nucleic acids are NOT DETECTED.  The SARS-CoV-2 RNA is generally detectable in upper and lower respiratory specimens during the acute phase of infection. The lowest concentration of SARS-CoV-2 viral copies this assay can detect is 250 copies / mL. A negative result does not preclude SARS-CoV-2 infection and should not be used as the sole basis for treatment or other patient management decisions.  A negative result may occur with improper specimen collection / handling, submission of specimen other than  nasopharyngeal swab, presence of viral mutation(s) within the areas targeted by this assay, and inadequate number of viral copies (<250 copies / mL). A negative result must be combined with clinical observations, patient history, and epidemiological information.  Fact Sheet for Patients:   StrictlyIdeas.no  Fact Sheet for Healthcare Providers: BankingDealers.co.za  This test is not yet approved or  cleared by the [place] FDA and has been authorized for detection and/or diagnosis of SARS-CoV-2 by FDA under an Emergency Use Authorization (EUA).  This EUA will remain in effect (meaning this test can be used) for the duration of the COVID-19 declaration under Section 564(b)(1) of the Act, 21 U.S.C. section 360bbb-3(b)(1), unless the authorization is terminated or revoked sooner.  Performed at [facility], [address] [place], [place] [address]   Culture, blood (Routine X 2) w Reflex to ID Panel     Status: None (Preliminary result)   Collection Time: 04/05/20  5:08 PM   Specimen: BLOOD  Result Value Ref Range Status   Specimen Description BLOOD LAC  Final   Special Requests   Final    BOTTLES DRAWN AEROBIC AND ANAEROBIC Blood Culture adequate volume   Culture   Final    NO GROWTH 2 DAYS Performed at [facility], [address] [place], [place] [address]    Report Status PENDING  Incomplete  CULTURE, BLOOD (ROUTINE X 2) w Reflex to ID Panel     Status: None (Preliminary result)   Collection Time: 04/06/20  7:30 AM   Specimen: BLOOD  Result Value Ref Range Status   Specimen Description BLOOD LEFT ARM  Final   Special Requests   Final    BOTTLES DRAWN AEROBIC AND ANAEROBIC Blood Culture results may not be optimal due to an excessive volume of blood received in culture bottles   Culture   Final    NO GROWTH < 24 HOURS Performed at [facility], [address] [place], [place] [address]     Report Status PENDING  Incomplete  CULTURE, BLOOD (ROUTINE X 2) w Reflex to ID Panel     Status: None (Preliminary result)   Collection Time: 04/06/20  8:28 AM   Specimen: BLOOD  Result Value Ref Range Status   Specimen Description BLOOD BLOOD RIGHT HAND  Final   Special Requests   Final    BOTTLES DRAWN AEROBIC AND ANAEROBIC Blood Culture adequate volume   Culture   Final    NO GROWTH < 24 HOURS Performed at [facility], [address] [place], [place] [address]    Report Status PENDING  Incomplete    IMAGING RESULTS:  I have personally reviewed the films ? Impression/Recommendation ? ?AKI with malignant HTN- started dialysis  Picture looks more like chronic kidney disease from the [place] rather than acute kidney injury. p Pt has not been  to a PCP in atleast 5 years so dont know his baseline creatinine. As per his dad his pcp is not in practice   New fever- with BPH and outlet obstruction UTI would be a concern but U/A no wbc. Culture pending No pneumonia. SARS cov2 neg, does not have diarrhea- so the CT finding of fluid in the intestine not significant . Continue vanco and ceftriaxone Has blistering lesions rt forearm secondary to medication extravasation No erythema   HTN diagnosed on presentaion- on amlodipine, hydralazine and metoprolol  BPH_ on flomax and avodart  Anemia of CKD  [employer] ? ___________________________________________________ Discussed with family Note:  This document was prepared using Dragon voice recognition software and may include unintentional dictation errors.

## 2020-04-07 NOTE — Progress Notes (Signed)
   04/07/20 0053  Assess: MEWS Score  Temp (!) 101.9 F (38.8 C)  BP (!) 170/96  Pulse Rate 99  Resp 18  SpO2 95 %  O2 Device Room Air  Assess: MEWS Score  MEWS Temp 2  MEWS Systolic 0  MEWS Pulse 0  MEWS RR 0  MEWS LOC 0  MEWS Score 2  MEWS Score Color Yellow  Assess: if the MEWS score is Yellow or Red  Were vital signs taken at a resting state? Yes  Focused Assessment No change from prior assessment  Early Detection of Sepsis Score *See Row Information* High  MEWS guidelines implemented *See Row Information* No, previously yellow, continue vital signs every 4 hours  Notify: Provider  Provider Name/Title NP Ouma  Date Provider Notified 04/07/20  Time Provider Notified 0118  Notification Type Page  Notification Reason Other (Comment) (still running a fever and HTN)  Response See new orders  Date of Provider Response 04/07/20  Time of Provider Response 0120  Document  Patient Outcome Other (Comment) (still has fever and HTN)  Progress note created (see row info) Yes  pt got another one time dose of Hydralazine 10mg

## 2020-04-07 NOTE — Progress Notes (Signed)
Patient ID: Joseph Gilmore, male   DOB: 1970-12-20, 49 y.o.   MRN: [id number] [facility] PROGRESS NOTE  Joseph Gilmore NID:[id number] DOB: 06-Jul-1971 DOA: 04/03/2020 PCP: [facility]  HPI/Subjective: Patient seen earlier while on dialysis.  He answered a few yes or no questions.  Patient has still been spiking fever.  No complaints of shortness of breath.  No complaints of cough.  No abdominal pain nausea vomiting or diarrhea.  Patient has some blisters on his right hand that appeared yesterday afternoon.  Objective: Vitals:   04/07/20 1357 04/07/20 1525  BP: (!) 145/84   Pulse: (!) 103   Resp: 20   Temp: (!) 101.7 F (38.7 C) 100.2 F (37.9 C)  SpO2: 99%     Intake/Output Summary (Last 24 hours) at 04/07/2020 1643 Last data filed at 04/07/2020 1500 Gross per 24 hour  Intake 339.82 ml  Output 900 ml  Net -560.18 ml   Filed Weights   04/06/20 0428 04/06/20 1953 04/07/20 0455  Weight: 112.1 kg 113.9 kg 114.7 kg    ROS: Review of Systems  Constitutional: Positive for fever.  Respiratory: Negative for cough and shortness of breath.   Gastrointestinal: Negative for abdominal pain, diarrhea, nausea and vomiting.  Musculoskeletal: Negative for joint pain.   Exam: Physical Exam HENT:     Nose: No mucosal edema.     Mouth/Throat:     Pharynx: No oropharyngeal exudate.  Eyes:     General: Lids are normal.     Conjunctiva/sclera: Conjunctivae normal.     Pupils: Pupils are equal, round, and reactive to light.  Cardiovascular:     Rate and Rhythm: Regular rhythm. Tachycardia present.     Heart sounds: Normal heart sounds, S1 normal and S2 normal.  Pulmonary:     Breath sounds: Examination of the right-middle field reveals decreased breath sounds. Examination of the left-middle field reveals decreased breath sounds. Examination of the right-lower field reveals decreased breath sounds and rhonchi. Examination of the left-lower field reveals decreased breath sounds and  rhonchi. Decreased breath sounds and rhonchi present. No wheezing or rales.  Abdominal:     Palpations: Abdomen is soft.     Tenderness: There is no abdominal tenderness.  Musculoskeletal:     Right ankle: Swelling present.     Left ankle: Swelling present.  Skin:    General: Skin is warm.     Findings: No rash.     Comments: Right hand 1 large blister and a few smaller blisters.  No redness around the skin.  Neurological:     Mental Status: He is alert.     Comments: Answers some yes or no questions.       Data Reviewed: Basic Metabolic Panel: Recent Labs  Lab 04/03/20 0721 04/03/20 1809 04/04/20 0916 04/05/20 0825 04/06/20 0527 04/07/20 0413  NA 140  --  141 139 142 139  K 4.0  --  3.6 3.2* 3.7 4.0  CL 111  --  107 102 101 99  CO2 14*  --  16* 21* 26 25  GLUCOSE 108*  --  102* 102* 106* 100*  BUN 74*  --  72* 69* 69* 50*  CREATININE 15.07*  --  14.15* 14.11* 13.38* 10.59*  CALCIUM 4.4*  --  4.6* 4.8* 4.9* 5.6*  MG 1.5*  --  1.5*  --  1.5* 1.6*  PHOS  --  6.3*  --  7.0*  --   --    Liver Function Tests: Recent Labs  Lab 04/02/20 2248 04/02/20 2248 04/03/20 0248 04/04/20 0916 04/05/20 0825 04/06/20 0527 04/07/20 0413  AST 51*  --  48* 22  --  22 31  ALT 69*  --  66* 36  --  28 29  ALKPHOS 58  --  62 51  --  48 44  BILITOT 0.5  --  0.8 0.8  --  0.7 0.9  PROT 7.0  --  7.6 6.4*  --  6.2* 6.2*  ALBUMIN 3.8   < > 3.9 3.4* 3.1* 3.3* 3.1*   < > = values in this interval not displayed.   Recent Labs  Lab 04/02/20 2248 04/04/20 1357  LIPASE 90* 71*   CBC: Recent Labs  Lab 04/04/20 0916 04/05/20 0825 04/06/20 0527 04/06/20 2055 04/07/20 0413  WBC 10.6* 9.6 10.8* 13.9* 12.8*  NEUTROABS 8.2* 6.8 7.4  --  9.6*  HGB 9.3* 8.7* 8.4* 9.6* 8.9*  HCT 27.5* 25.7* 26.1* 29.4* 26.7*  MCV 81.8 82.6 86.1 85.2 85.3  PLT 163 162 171 178 168   Cardiac Enzymes: Recent Labs  Lab 04/03/20 0721 04/04/20 1357 04/06/20 0730  CKTOTAL 2,732* 2,334* 2,023*     Recent Results (from the past 240 hour(s))  SARS Coronavirus 2 by RT PCR (hospital order, performed in [facility] hospital lab) Nasopharyngeal Nasopharyngeal Swab     Status: None   Collection Time: 04/03/20  3:06 AM   Specimen: Nasopharyngeal Swab  Result Value Ref Range Status   SARS Coronavirus 2 NEGATIVE NEGATIVE Final    Comment: (NOTE) SARS-CoV-2 target nucleic acids are NOT DETECTED.  The SARS-CoV-2 RNA is generally detectable in upper and lower respiratory specimens during the acute phase of infection. The lowest concentration of SARS-CoV-2 viral copies this assay can detect is 250 copies / mL. A negative result does not preclude SARS-CoV-2 infection and should not be used as the sole basis for treatment or other patient management decisions.  A negative result may occur with improper specimen collection / handling, submission of specimen other than nasopharyngeal swab, presence of viral mutation(s) within the areas targeted by this assay, and inadequate number of viral copies (<250 copies / mL). A negative result must be combined with clinical observations, patient history, and epidemiological information.  Fact Sheet for Patients:   StrictlyIdeas.no  Fact Sheet for Healthcare Providers: BankingDealers.co.za  This test is not yet approved or  cleared by the [place] FDA and has been authorized for detection and/or diagnosis of SARS-CoV-2 by FDA under an Emergency Use Authorization (EUA).  This EUA will remain in effect (meaning this test can be used) for the duration of the COVID-19 declaration under Section 564(b)(1) of the Act, 21 U.S.C. section 360bbb-3(b)(1), unless the authorization is terminated or revoked sooner.  Performed at [facility], [address] [place], [place] [address]   Culture, blood (Routine X 2) w Reflex to ID Panel     Status: None (Preliminary result)   Collection Time:  04/05/20  5:08 PM   Specimen: BLOOD  Result Value Ref Range Status   Specimen Description BLOOD LAC  Final   Special Requests   Final    BOTTLES DRAWN AEROBIC AND ANAEROBIC Blood Culture adequate volume   Culture   Final    NO GROWTH 2 DAYS Performed at [facility], [address] [place], [place] [address]    Report Status PENDING  Incomplete  CULTURE, BLOOD (ROUTINE X 2) w Reflex to ID Panel     Status: None (Preliminary  result)   Collection Time: 04/06/20  7:30 AM   Specimen: BLOOD  Result Value Ref Range Status   Specimen Description BLOOD LEFT ARM  Final   Special Requests   Final    BOTTLES DRAWN AEROBIC AND ANAEROBIC Blood Culture results may not be optimal due to an excessive volume of blood received in culture bottles   Culture   Final    NO GROWTH < 24 HOURS Performed at [facility], [address], [place], [place] [address]    Report Status PENDING  Incomplete  CULTURE, BLOOD (ROUTINE X 2) w Reflex to ID Panel     Status: None (Preliminary result)   Collection Time: 04/06/20  8:28 AM   Specimen: BLOOD  Result Value Ref Range Status   Specimen Description BLOOD BLOOD RIGHT HAND  Final   Special Requests   Final    BOTTLES DRAWN AEROBIC AND ANAEROBIC Blood Culture adequate volume   Culture   Final    NO GROWTH < 24 HOURS Performed at [facility], [address], [place], [place] [address]    Report Status PENDING  Incomplete     Studies: DG Chest 2 View  Result Date: 04/06/2020 CLINICAL DATA:  New onset fever EXAM: CHEST - 2 VIEW COMPARISON:  04/02/2020 FINDINGS: Mild cardiomegaly. Low volume examination. Both lungs are clear. The visualized skeletal structures are unremarkable. IMPRESSION: Mild cardiomegaly. No acute abnormality of the lungs in low volume examination. No focal airspace opacity. Electronically Signed   By: Eddie Candle M.D.   On: 04/06/2020 08:01   PERIPHERAL VASCULAR CATHETERIZATION  Result Date: 04/06/2020 See op  note  ECHOCARDIOGRAM COMPLETE  Result Date: 04/06/2020    ECHOCARDIOGRAM REPORT   Patient Name:   JAMELLE GOLDSTON Date of Exam: 04/06/2020 Medical Rec #:  [id number]     Height:       68.0 in Accession #:    [id number]    Weight:       247.1 lb Date of Birth:  10-08-1970     BSA:          2.236 m Patient Age:    72 years      BP:           171/90 mmHg Patient Gender: M             HR:           100 bpm. Exam Location:  [facility] Procedure: 2D Echo, Cardiac Doppler and Color Doppler Indications:     HTN  History:         Patient has no prior history of Echocardiogram examinations.                  Signs/Symptoms:Murmur.  Sonographer:     Sherrie Sport RDCS (AE) Referring Phys:  [place] Diagnosing Phys: Serafina Royals MD IMPRESSIONS  1. Left ventricular ejection fraction, by estimation, is 60 to 65%. The left ventricle has normal function. The left ventricle has no regional wall motion abnormalities. Left ventricular diastolic parameters were normal.  2. Right ventricular systolic function is normal. The right ventricular size is normal. There is normal pulmonary artery systolic pressure.  3. The mitral valve is normal in structure. Trivial mitral valve regurgitation.  4. The aortic valve is normal in structure. Aortic valve regurgitation is not visualized. FINDINGS  Left Ventricle: Left ventricular ejection fraction, by estimation, is 60 to 65%. The left ventricle has normal function. The left ventricle has  no regional wall motion abnormalities. The left ventricular internal cavity size was normal in size. There is  no left ventricular hypertrophy. Left ventricular diastolic parameters were normal. Right Ventricle: The right ventricular size is normal. No increase in right ventricular wall thickness. Right ventricular systolic function is normal. There is normal pulmonary artery systolic pressure. The tricuspid regurgitant velocity is 1.51 m/s, and  with an assumed right atrial pressure of 10 mmHg, the estimated  right ventricular systolic pressure is 40.9 mmHg. Left Atrium: Left atrial size was normal in size. Right Atrium: Right atrial size was normal in size. Pericardium: There is no evidence of pericardial effusion. Mitral Valve: The mitral valve is normal in structure. Trivial mitral valve regurgitation. Tricuspid Valve: The tricuspid valve is normal in structure. Tricuspid valve regurgitation is trivial. Aortic Valve: The aortic valve is normal in structure. Aortic valve regurgitation is not visualized. Aortic valve mean gradient measures 6.3 mmHg. Aortic valve peak gradient measures 9.6 mmHg. Aortic valve area, by VTI measures 3.22 cm. Pulmonic Valve: The pulmonic valve was normal in structure. Pulmonic valve regurgitation is not visualized. Aorta: The aortic root and ascending aorta are structurally normal, with no evidence of dilitation. IAS/Shunts: No atrial level shunt detected by color flow Doppler.  LEFT VENTRICLE PLAX 2D LVIDd:         4.74 cm  Diastology LVIDs:         2.65 cm  LV e' lateral:   6.64 cm/s LV PW:         1.86 cm  LV E/e' lateral: 17.6 LV IVS:        1.49 cm  LV e' medial:    6.85 cm/s LVOT diam:     2.00 cm  LV E/e' medial:  17.1 LV SV:         81 LV SV Index:   36 LVOT Area:     3.14 cm  RIGHT VENTRICLE RV Basal diam:  3.30 cm RV S prime:     16.90 cm/s TAPSE (M-mode): 4.4 cm LEFT ATRIUM             Index       RIGHT ATRIUM           Index LA diam:        3.00 cm 1.34 cm/m  RA Area:     21.20 cm LA Vol (A2C):   86.5 ml 38.69 ml/m RA Volume:   67.90 ml  30.37 ml/m LA Vol (A4C):   75.5 ml 33.77 ml/m LA Biplane Vol: 82.8 ml 37.03 ml/m  AORTIC VALVE                    PULMONIC VALVE AV Area (Vmax):    2.55 cm     PV Vmax:        1.00 m/s AV Area (Vmean):   2.42 cm     PV Peak grad:   4.0 mmHg AV Area (VTI):     3.22 cm     RVOT Peak grad: 9 mmHg AV Vmax:           155.00 cm/s AV Vmean:          113.800 cm/s AV VTI:            0.251 m AV Peak Grad:      9.6 mmHg AV Mean Grad:      6.3  mmHg LVOT Vmax:         126.00 cm/s LVOT Vmean:  87.600 cm/s LVOT VTI:          0.257 m LVOT/AV VTI ratio: 1.03  AORTA Ao Root diam: 2.80 cm MITRAL VALVE                TRICUSPID VALVE MV Area (PHT): 2.77 cm     TR Peak grad:   9.1 mmHg MV Decel Time: 274 msec     TR Vmax:        151.00 cm/s MV E velocity: 117.00 cm/s MV A velocity: 100.00 cm/s  SHUNTS MV E/A ratio:  1.17         Systemic VTI:  0.26 m                             Systemic Diam: 2.00 cm Serafina Royals MD Electronically signed by Serafina Royals MD Signature Date/Time: 04/06/2020/3:38:35 PM    Final     Scheduled Meds: . amLODipine  10 mg Oral Daily  . calcium acetate  1,334 mg Oral TID WC  . dutasteride  0.5 mg Oral Daily  . heparin  5,000 Units Subcutaneous Q8H  . hydrALAZINE  10 mg Oral Q8H  . magnesium oxide  400 mg Oral Daily  . metoprolol tartrate  100 mg Oral BID  . multivitamin  1 tablet Oral QHS  . sodium chloride flush  10 mL Intravenous Q12H  . tamsulosin  0.4 mg Oral Daily  . vancomycin variable dose per unstable renal function (pharmacist dosing)   Does not apply See admin instructions   Continuous Infusions: . sodium chloride Stopped (04/07/20 0320)  . cefTRIAXone (ROCEPHIN)  IV      Assessment/Plan:  1. Acute kidney injury with rhabdomyolysis.  Vascular surgery put a PermCath in yesterday and had dialysis yesterday and seen on dialysis today.  Continue to monitor urine output and CPK.  TSH normal range.  Acute kidney injury likely has an obstructive component.  Foley catheter placed. 2. Fever.  Unclear etiology.  Rocephin started yesterday and vancomycin started early morning today.  Blood cultures so far negative.  Yesterday's chest x-ray negative.  CT scan the other day showed possible enteritis.  We will get infectious disease consultation.  Not sure what the blisters are on the right hand.  We will get an ultrasound the lower extremity to rule out DVT. 3. BPH.  Foley catheter.  Patient on Flomax and  Proscar. 4. Elevated PSA at 26.65.  Urology will have to follow-up as outpatient. 5. Anemia of chronic disease.  We will send off an LDH and a haptoglobin. 6. Essential hypertension on Norvasc and metoprolol 7. Weakness.  Physical therapy evaluation      Code Status:     Code Status Orders  (From admission, onward)         Start     Ordered   04/03/20 0443  Full code  Continuous        04/03/20 0447        Code Status History    This patient has a current code status but no historical code status.   Advance Care Planning Activity     Family Communication: Left message for father on the phone Disposition Plan: Status is: Inpatient   Dispo: The patient is from: Home              Anticipated d/c is to: Home              Anticipated d/c  date is: We will need more time here in the hospital.  Unclear source of his fever.  New dialysis start.              Patient currently still spiking fever and I do not have a clear source.  So far blood cultures are negative.  Chest x-ray yesterday negative.  I will get the ultrasound the lower extremity and infectious disease consultation.  Second dialysis session today.  Consultants:  Nephrology  Urology  Vascular surgery  Infectious disease  Procedures:  PermCath  Antibiotics:  Rocephin  Vancomycin  Time spent: 28 minutes  [place]

## 2020-04-07 NOTE — Progress Notes (Signed)
PT Cancellation Note  Patient Details Name: BERT PTACEK MRN: [id number] DOB: 1970/09/04   Cancelled Treatment:    Reason Eval/Treat Not Completed: Medical issues which prohibited therapy: Pt's Ca 5.6 falling below guidelines for participation with PT services.  Will attempt to see pt at a future date/time as medically appropriate.     Linus Salmons PT, DPT 04/07/20, 11:50 AM

## 2020-04-07 NOTE — Progress Notes (Signed)
Patient's fever increasing, applied cooling blanket and rectal temp probe for continuous monitoring.

## 2020-04-07 NOTE — Progress Notes (Signed)
[facility]  ROUNDING NOTE   Subjective:   First hemodialysis treatment yesterday. Tolerated treatment well. No ultrafiltration.   Seen and examined today on secondary hemodialysis treatment. Tolerated treatment well.     HEMODIALYSIS FLOWSHEET:  Blood Flow Rate (mL/min): 250 mL/min Arterial Pressure (mmHg): -110 mmHg Venous Pressure (mmHg): 80 mmHg Transmembrane Pressure (mmHg): 50 mmHg Ultrafiltration Rate (mL/min): 600 mL/min Dialysate Flow Rate (mL/min): 500 ml/min Conductivity: Machine : 14.1 Conductivity: Machine : 14.1 Dialysis Fluid Bolus: Normal Saline Bolus Amount (mL): 300 mL     Objective:  Vital signs in last 24 hours:  Temp:  [98.6 F (37 C)-102.3 F (39.1 C)] 100.4 F (38 C) (08/10 1153) Pulse Rate:  [90-111] 111 (08/10 1153) Resp:  [15-20] 18 (08/10 1115) BP: (94-180)/(59-112) 180/112 (08/10 1153) SpO2:  [95 %-100 %] 98 % (08/10 1153) Weight:  [113.9 kg-114.7 kg] 114.7 kg (08/10 0455)  Weight change: 1.814 kg Filed Weights   04/06/20 0428 04/06/20 1953 04/07/20 0455  Weight: 112.1 kg 113.9 kg 114.7 kg    Intake/Output: I/O last 3 completed shifts: In: 107.2 [I.V.:1.5; IV Piggyback:105.7] Out: 1100 [Urine:1600]   Intake/Output this shift:  Total I/O In: -  Out: 1000 [Other:1000]  Physical Exam: General: NAD, laying in bed  Head: Normocephalic, atraumatic. Moist oral mucosal membranes  Eyes: Anicteric, PERRL  Neck: Supple, trachea midline  Lungs:  Clear to auscultation  Heart: Regular rate and rhythm  Abdomen:  Soft, nontender,   Extremities:  no peripheral edema.  Neurologic: +learning disability, unable to give history  Skin: No lesions  Access RIJ permcath 8/9 Dr. Lucky Cowboy  GU Foley catheter with urine clear    Basic Metabolic Panel: Recent Labs  Lab 04/03/20 0721 04/03/20 0721 04/03/20 1809 04/04/20 0916 04/04/20 0916 04/05/20 0825 04/06/20 0527 04/07/20 0413  NA 140  --   --  141  --  139 142 139  K 4.0  --    --  3.6  --  3.2* 3.7 4.0  CL 111  --   --  107  --  102 101 99  CO2 14*  --   --  16*  --  21* 26 25  GLUCOSE 108*  --   --  102*  --  102* 106* 100*  BUN 74*  --   --  72*  --  69* 69* 50*  CREATININE 15.07*  --   --  14.15*  --  14.11* 13.38* 10.59*  CALCIUM 4.4*   < >  --  4.6*   < > 4.8* 4.9* 5.6*  MG 1.5*  --   --  1.5*  --   --  1.5* 1.6*  PHOS  --   --  6.3*  --   --  7.0*  --   --    < > = values in this interval not displayed.    Liver Function Tests: Recent Labs  Lab 04/02/20 2248 04/02/20 2248 04/03/20 0248 04/04/20 0916 04/05/20 0825 04/06/20 0527 04/07/20 0413  AST 51*  --  48* 22  --  22 31  ALT 69*  --  66* 36  --  28 29  ALKPHOS 58  --  62 51  --  48 44  BILITOT 0.5  --  0.8 0.8  --  0.7 0.9  PROT 7.0  --  7.6 6.4*  --  6.2* 6.2*  ALBUMIN 3.8   < > 3.9 3.4* 3.1* 3.3* 3.1*   < > = values in this  interval not displayed.   Recent Labs  Lab 04/02/20 2248 04/04/20 1357  LIPASE 90* 71*   No results for input(s): AMMONIA in the last 168 hours.  CBC: Recent Labs  Lab 04/04/20 0916 04/05/20 0825 04/06/20 0527 04/06/20 2055 04/07/20 0413  WBC 10.6* 9.6 10.8* 13.9* 12.8*  NEUTROABS 8.2* 6.8 7.4  --  9.6*  HGB 9.3* 8.7* 8.4* 9.6* 8.9*  HCT 27.5* 25.7* 26.1* 29.4* 26.7*  MCV 81.8 82.6 86.1 85.2 85.3  PLT 163 162 171 178 168    Cardiac Enzymes: Recent Labs  Lab 04/03/20 0721 04/04/20 1357 04/06/20 0730  CKTOTAL 2,732* 2,334* 2,023*    BNP: Invalid input(s): POCBNP  CBG: No results for input(s): GLUCAP in the last 168 hours.  Microbiology: Results for orders placed or performed during the hospital encounter of 04/03/20  SARS Coronavirus 2 by RT PCR (hospital order, performed in [facility] hospital lab) Nasopharyngeal Nasopharyngeal Swab     Status: None   Collection Time: 04/03/20  3:06 AM   Specimen: Nasopharyngeal Swab  Result Value Ref Range Status   SARS Coronavirus 2 NEGATIVE NEGATIVE Final    Comment: (NOTE) SARS-CoV-2 target  nucleic acids are NOT DETECTED.  The SARS-CoV-2 RNA is generally detectable in upper and lower respiratory specimens during the acute phase of infection. The lowest concentration of SARS-CoV-2 viral copies this assay can detect is 250 copies / mL. A negative result does not preclude SARS-CoV-2 infection and should not be used as the sole basis for treatment or other patient management decisions.  A negative result may occur with improper specimen collection / handling, submission of specimen other than nasopharyngeal swab, presence of viral mutation(s) within the areas targeted by this assay, and inadequate number of viral copies (<250 copies / mL). A negative result must be combined with clinical observations, patient history, and epidemiological information.  Fact Sheet for Patients:   StrictlyIdeas.no  Fact Sheet for Healthcare Providers: BankingDealers.co.za  This test is not yet approved or  cleared by the [place] FDA and has been authorized for detection and/or diagnosis of SARS-CoV-2 by FDA under an Emergency Use Authorization (EUA).  This EUA will remain in effect (meaning this test can be used) for the duration of the COVID-19 declaration under Section 564(b)(1) of the Act, 21 U.S.C. section 360bbb-3(b)(1), unless the authorization is terminated or revoked sooner.  Performed at [facility], [address] [place], [place] [address]   Culture, blood (Routine X 2) w Reflex to ID Panel     Status: None (Preliminary result)   Collection Time: 04/05/20  5:08 PM   Specimen: BLOOD  Result Value Ref Range Status   Specimen Description BLOOD LAC  Final   Special Requests   Final    BOTTLES DRAWN AEROBIC AND ANAEROBIC Blood Culture adequate volume   Culture   Final    NO GROWTH 2 DAYS Performed at [facility], [address] [place], [place] [address]    Report Status PENDING  Incomplete  CULTURE,  BLOOD (ROUTINE X 2) w Reflex to ID Panel     Status: None (Preliminary result)   Collection Time: 04/06/20  7:30 AM   Specimen: BLOOD  Result Value Ref Range Status   Specimen Description BLOOD LEFT ARM  Final   Special Requests   Final    BOTTLES DRAWN AEROBIC AND ANAEROBIC Blood Culture results may not be optimal due to an excessive volume of blood received in culture bottles   Culture  Final    NO GROWTH < 24 HOURS Performed at [facility], [address] [place], [place] [address]    Report Status PENDING  Incomplete  CULTURE, BLOOD (ROUTINE X 2) w Reflex to ID Panel     Status: None (Preliminary result)   Collection Time: 04/06/20  8:28 AM   Specimen: BLOOD  Result Value Ref Range Status   Specimen Description BLOOD BLOOD RIGHT HAND  Final   Special Requests   Final    BOTTLES DRAWN AEROBIC AND ANAEROBIC Blood Culture adequate volume   Culture   Final    NO GROWTH < 24 HOURS Performed at [facility], [address] [place], [place] [address]    Report Status PENDING  Incomplete    Coagulation Studies: Recent Labs    04/06/20 0527  LABPROT 15.1  INR 1.2    Urinalysis: No results for input(s): COLORURINE, LABSPEC, PHURINE, GLUCOSEU, HGBUR, BILIRUBINUR, KETONESUR, PROTEINUR, UROBILINOGEN, NITRITE, LEUKOCYTESUR in the last 72 hours.  Invalid input(s): APPERANCEUR    Imaging: DG Chest 2 View  Result Date: 04/06/2020 CLINICAL DATA:  New onset fever EXAM: CHEST - 2 VIEW COMPARISON:  04/02/2020 FINDINGS: Mild cardiomegaly. Low volume examination. Both lungs are clear. The visualized skeletal structures are unremarkable. IMPRESSION: Mild cardiomegaly. No acute abnormality of the lungs in low volume examination. No focal airspace opacity. Electronically Signed   By: Eddie Candle M.D.   On: 04/06/2020 08:01   PERIPHERAL VASCULAR CATHETERIZATION  Result Date: 04/06/2020 See op note  ECHOCARDIOGRAM COMPLETE  Result Date: 04/06/2020    ECHOCARDIOGRAM  REPORT   Patient Name:   Joseph Gilmore Date of Exam: 04/06/2020 Medical Rec #:  [id number]     Height:       68.0 in Accession #:    [id number]    Weight:       247.1 lb Date of Birth:  Apr 22, 1971     BSA:          2.236 m Patient Age:    75 years      BP:           171/90 mmHg Patient Gender: M             HR:           100 bpm. Exam Location:  [facility] Procedure: 2D Echo, Cardiac Doppler and Color Doppler Indications:     HTN  History:         Patient has no prior history of Echocardiogram examinations.                  Signs/Symptoms:Murmur.  Sonographer:     Sherrie Sport RDCS (AE) Referring Phys:  [place] Diagnosing Phys: Serafina Royals MD IMPRESSIONS  1. Left ventricular ejection fraction, by estimation, is 60 to 65%. The left ventricle has normal function. The left ventricle has no regional wall motion abnormalities. Left ventricular diastolic parameters were normal.  2. Right ventricular systolic function is normal. The right ventricular size is normal. There is normal pulmonary artery systolic pressure.  3. The mitral valve is normal in structure. Trivial mitral valve regurgitation.  4. The aortic valve is normal in structure. Aortic valve regurgitation is not visualized. FINDINGS  Left Ventricle: Left ventricular ejection fraction, by estimation, is 60 to 65%. The left ventricle has normal function. The left ventricle has no regional wall motion abnormalities. The left ventricular internal cavity size was normal in size. There is  no left ventricular  hypertrophy. Left ventricular diastolic parameters were normal. Right Ventricle: The right ventricular size is normal. No increase in right ventricular wall thickness. Right ventricular systolic function is normal. There is normal pulmonary artery systolic pressure. The tricuspid regurgitant velocity is 1.51 m/s, and  with an assumed right atrial pressure of 10 mmHg, the estimated right ventricular systolic pressure is 24.4 mmHg. Left Atrium: Left atrial  size was normal in size. Right Atrium: Right atrial size was normal in size. Pericardium: There is no evidence of pericardial effusion. Mitral Valve: The mitral valve is normal in structure. Trivial mitral valve regurgitation. Tricuspid Valve: The tricuspid valve is normal in structure. Tricuspid valve regurgitation is trivial. Aortic Valve: The aortic valve is normal in structure. Aortic valve regurgitation is not visualized. Aortic valve mean gradient measures 6.3 mmHg. Aortic valve peak gradient measures 9.6 mmHg. Aortic valve area, by VTI measures 3.22 cm. Pulmonic Valve: The pulmonic valve was normal in structure. Pulmonic valve regurgitation is not visualized. Aorta: The aortic root and ascending aorta are structurally normal, with no evidence of dilitation. IAS/Shunts: No atrial level shunt detected by color flow Doppler.  LEFT VENTRICLE PLAX 2D LVIDd:         4.74 cm  Diastology LVIDs:         2.65 cm  LV e' lateral:   6.64 cm/s LV PW:         1.86 cm  LV E/e' lateral: 17.6 LV IVS:        1.49 cm  LV e' medial:    6.85 cm/s LVOT diam:     2.00 cm  LV E/e' medial:  17.1 LV SV:         81 LV SV Index:   36 LVOT Area:     3.14 cm  RIGHT VENTRICLE RV Basal diam:  3.30 cm RV S prime:     16.90 cm/s TAPSE (M-mode): 4.4 cm LEFT ATRIUM             Index       RIGHT ATRIUM           Index LA diam:        3.00 cm 1.34 cm/m  RA Area:     21.20 cm LA Vol (A2C):   86.5 ml 38.69 ml/m RA Volume:   67.90 ml  30.37 ml/m LA Vol (A4C):   75.5 ml 33.77 ml/m LA Biplane Vol: 82.8 ml 37.03 ml/m  AORTIC VALVE                    PULMONIC VALVE AV Area (Vmax):    2.55 cm     PV Vmax:        1.00 m/s AV Area (Vmean):   2.42 cm     PV Peak grad:   4.0 mmHg AV Area (VTI):     3.22 cm     RVOT Peak grad: 9 mmHg AV Vmax:           155.00 cm/s AV Vmean:          113.800 cm/s AV VTI:            0.251 m AV Peak Grad:      9.6 mmHg AV Mean Grad:      6.3 mmHg LVOT Vmax:         126.00 cm/s LVOT Vmean:        87.600 cm/s LVOT VTI:           0.257 m  LVOT/AV VTI ratio: 1.03  AORTA Ao Root diam: 2.80 cm MITRAL VALVE                TRICUSPID VALVE MV Area (PHT): 2.77 cm     TR Peak grad:   9.1 mmHg MV Decel Time: 274 msec     TR Vmax:        151.00 cm/s MV E velocity: 117.00 cm/s MV A velocity: 100.00 cm/s  SHUNTS MV E/A ratio:  1.17         Systemic VTI:  0.26 m                             Systemic Diam: 2.00 cm Serafina Royals MD Electronically signed by Serafina Royals MD Signature Date/Time: 04/06/2020/3:38:35 PM    Final      Medications:   . sodium chloride Stopped (04/07/20 0320)  . cefTRIAXone (ROCEPHIN)  IV    . magnesium sulfate bolus IVPB 2 g (04/07/20 1216)  . vancomycin 1,000 mg (04/07/20 1303)   . amLODipine  10 mg Oral Daily  . calcium acetate  1,334 mg Oral TID WC  . dutasteride  0.5 mg Oral Daily  . heparin  5,000 Units Subcutaneous Q8H  . hydrALAZINE  10 mg Oral Q8H  . magnesium oxide  400 mg Oral Daily  . metoprolol tartrate  12.5 mg Oral BID  . multivitamin  1 tablet Oral QHS  . sodium chloride flush  10 mL Intravenous Q12H  . tamsulosin  0.4 mg Oral Daily  . vancomycin variable dose per unstable renal function (pharmacist dosing)   Does not apply See admin instructions   sodium chloride, acetaminophen **OR** acetaminophen, hydrALAZINE, HYDROmorphone (DILAUDID) injection, LORazepam, ondansetron (ZOFRAN) IV  Assessment/ Plan:  Joseph Gilmore is a 49 y.o. black male with learning disability, autism, hypertension, obsessive compulsive disorder who is admitted to [facility] on 04/03/2020 for Hypocalcemia [E83.51] Enteritis [K52.9] Prolonged Q-T interval on ECG [R94.31] AKI (acute kidney injury) ([place]) [N17.9] Acute kidney injury ([place]) [N17.9]  1.  Acute kidney injury with metabolic acidosis and proteinuria. No known baseline creatinine.  Renal ultrasound consistent with chronic kidney disease.  No IV contrast exposure.  Acute renal failure seems to be secondary to obstructive uropathy. Foley catheter  placed Work up so far with negative for viral hepatitis, HIV negative.  Pending SPEP/UPEP, ANA, ANCA, anti-GBM, serum complements.  Nonoliguric urine output.    - Discontinue sodium bicarbonate infusion - Dialysis for today. Consult vascular for dialysis access placement  2. Secondary Hyperparathyroidism with Hypocalcemia and hyperphosphatemia Required calcium gluconate - Continue PO calcium acetate with meals.   3. Hypertension: emergency on admission.  Current regimen of metoprolol, hydralazine, amlodipine and tamsulosin. Holding ACE-I/ARB and diuretics due to renal failure at this time.  - increase metoprolol to 100mg  bid PO  4. Anemia with renal failure and iron deficiency: folic acid level is also low.  hemoglobin 8.9 - schedule EPO with next dialysis treatment - Continue renal vitamin.    5. Obstructive uropathy: foley catheter placed - started on tamsulosin and dutasteride this admission - Appreciate urology input.     LOS: 4 Syrai Gladwin 8/10/20211:06 PM

## 2020-04-07 NOTE — Plan of Care (Signed)

## 2020-04-07 NOTE — Progress Notes (Signed)
[facility] Daily Progress Note   Subjective: 04/06/20: 1. Ultrasound guidance for vascular access to the right internal jugular vein 2. Fluoroscopic guidance for placement of catheter 3. Placement of a 23 cm tip to cuff tunneled hemodialysis catheter via the right internal jugular vein  Patient without complaint this afternoon.   Objective: Vitals:   04/07/20 1056 04/07/20 1115 04/07/20 1153 04/07/20 1357  BP: (!) 169/103 (!) 168/110 (!) 180/112 (!) 145/84  Pulse: (!) 105 (!) 104 (!) 111 (!) 103  Resp:  18  20  Temp:  98.9 F (37.2 C) (!) 100.4 F (38 C) (!) 101.7 F (38.7 C)  TempSrc:  Oral Oral Oral  SpO2:  98% 98% 99%  Weight:      Height:        Intake/Output Summary (Last 24 hours) at 04/07/2020 1443 Last data filed at 04/07/2020 1115 Gross per 24 hour  Intake 107.2 ml  Output 900 ml  Net -792.8 ml   Physical Exam: A&Ox1, NAD CV: RRR Pulmonary: CTA Bilaterally Abdomen: Soft, Nontender, Nondistended Vascular:  Permcath: Dressing clean and dry. Site without swelling or drainage.   Laboratory: CBC    Component Value Date/Time   WBC 12.8 (H) 04/07/2020 0413   HGB 8.9 (L) 04/07/2020 0413   HCT 26.7 (L) 04/07/2020 0413   PLT 168 04/07/2020 0413   BMET    Component Value Date/Time   NA 139 04/07/2020 0413   K 4.0 04/07/2020 0413   CL 99 04/07/2020 0413   CO2 25 04/07/2020 0413   GLUCOSE 100 (H) 04/07/2020 0413   BUN 50 (H) 04/07/2020 0413   CREATININE 10.59 (H) 04/07/2020 0413   CALCIUM 5.6 (LL) 04/07/2020 0413   GFRNONAA 5 (L) 04/07/2020 0413   GFRAA 6 (L) 04/07/2020 0413   Assessment/Planning: The patient is a 49 year old male with acute kidney failure s/p permcath insertion - POD#1  1) Permcath seems to be functioning well during dialysis 2) If kidney function does not recover will see in outpatient setting for vein mapping 3) Will sign off at this time, please reconsult if needed  Discussed with Dr. Ellis Parents Eulonda Andalon  PA-C 04/07/2020 2:43 PM

## 2020-04-08 ENCOUNTER — Inpatient Hospital Stay: Payer: Medicare Other

## 2020-04-08 DIAGNOSIS — N401 Enlarged prostate with lower urinary tract symptoms: Secondary | ICD-10-CM | POA: Diagnosis not present

## 2020-04-08 DIAGNOSIS — N138 Other obstructive and reflux uropathy: Secondary | ICD-10-CM | POA: Diagnosis not present

## 2020-04-08 DIAGNOSIS — N179 Acute kidney failure, unspecified: Secondary | ICD-10-CM | POA: Diagnosis not present

## 2020-04-08 LAB — CBC
HCT: 32.2 % — ABNORMAL LOW (ref 39.0–52.0)
Hemoglobin: 10 g/dL — ABNORMAL LOW (ref 13.0–17.0)
MCH: 27.4 pg (ref 26.0–34.0)
MCHC: 31.1 g/dL (ref 30.0–36.0)
MCV: 88.2 fL (ref 80.0–100.0)
Platelets: 161 10*3/uL (ref 150–400)
RBC: 3.65 MIL/uL — ABNORMAL LOW (ref 4.22–5.81)
RDW: 13.5 % (ref 11.5–15.5)
WBC: 13 10*3/uL — ABNORMAL HIGH (ref 4.0–10.5)
nRBC: 0 % (ref 0.0–0.2)

## 2020-04-08 LAB — COMPREHENSIVE METABOLIC PANEL
ALT: 26 U/L (ref 0–44)
AST: 32 U/L (ref 15–41)
Albumin: 3.3 g/dL — ABNORMAL LOW (ref 3.5–5.0)
Alkaline Phosphatase: 55 U/L (ref 38–126)
Anion gap: 14 (ref 5–15)
BUN: 46 mg/dL — ABNORMAL HIGH (ref 6–20)
CO2: 28 mmol/L (ref 22–32)
Calcium: 6.5 mg/dL — ABNORMAL LOW (ref 8.9–10.3)
Chloride: 98 mmol/L (ref 98–111)
Creatinine, Ser: 8.29 mg/dL — ABNORMAL HIGH (ref 0.61–1.24)
GFR calc Af Amer: 8 mL/min — ABNORMAL LOW (ref >60)
GFR calc non Af Amer: 7 mL/min — ABNORMAL LOW (ref >60)
Glucose, Bld: 104 mg/dL — ABNORMAL HIGH (ref 70–99)
Potassium: 4.3 mmol/L (ref 3.5–5.1)
Sodium: 140 mmol/L (ref 135–145)
Total Bilirubin: 0.8 mg/dL (ref 0.3–1.2)
Total Protein: 7 g/dL (ref 6.5–8.1)

## 2020-04-08 LAB — QUANTIFERON-TB GOLD PLUS (RQFGPL)
QuantiFERON Mitogen Value: 3.94 IU/mL
QuantiFERON Nil Value: 0 IU/mL
QuantiFERON TB1 Ag Value: 0 IU/mL
QuantiFERON TB2 Ag Value: 0 IU/mL

## 2020-04-08 LAB — URINE CULTURE

## 2020-04-08 LAB — MAGNESIUM: Magnesium: 2 mg/dL (ref 1.7–2.4)

## 2020-04-08 LAB — LACTATE DEHYDROGENASE: LDH: 393 U/L — ABNORMAL HIGH (ref 98–192)

## 2020-04-08 LAB — PHOSPHORUS: Phosphorus: 5.3 mg/dL — ABNORMAL HIGH (ref 2.5–4.6)

## 2020-04-08 LAB — QUANTIFERON-TB GOLD PLUS: QuantiFERON-TB Gold Plus: NEGATIVE

## 2020-04-08 LAB — PROCALCITONIN: Procalcitonin: 0.98 ng/mL

## 2020-04-08 LAB — CK: Total CK: 1168 U/L — ABNORMAL HIGH (ref 49–397)

## 2020-04-08 MED ORDER — VANCOMYCIN HCL IN DEXTROSE 1-5 GM/200ML-% IV SOLN
1000.0000 mg | Freq: Once | INTRAVENOUS | Status: AC
  Administered 2020-04-09: 1000 mg via INTRAVENOUS
  Filled 2020-04-08: qty 200

## 2020-04-08 NOTE — Evaluation (Signed)
Physical Therapy Evaluation Patient Details Name: Joseph Gilmore MRN: [id number] DOB: 1970-09-20 Today's Date: 04/08/2020   History of Present Illness  Pt is a 49 y.o. male w/ PMH of autism, murmur, and OCD. Per MD Impression, pt currently presents with: AKI 2/2 underlying CKD , metabolic acidosis, hypertensive urgency, hypocalcemia, anemia, elevated LFTs, elevated lipase, vomiting, BPH. ESRD, and sepsis.  Clinical Impression  Pt was pleasant and motivated to participate during the session. Pt communication was limited to 1-2 word phrases and caregiver (father) assisted w/ history and communication throughout session; pt did not appear to have any difficulty following simple commands. Pt was able to perform supine-sit transition w/o physical assist, but moved very quickly and nearly rolled himself off the bed; required min-A to stay on bed. Pt was able to perform sit-to-stand to RW w/ min-mod assist to prevent posterior LOB; generally appeared unsteady throughout w/ poor eccentric control, required min cueing for hand positioning. Pt was able to maintain standing for ~88min w/ BUE support on RW and CGA; pt appeared very hesitant and somewhat unsteady in standing, but no LOB noted. Pt was able to ambulate ~36ft to recliner w/ small, effortful steps; after a rest was able to ambulate another 14ft. Pt generally kept a large BOS and had a difficult time staying w/in the frame of RW; close CGA was maintained 2/2 unsteadiness. Pt followed cues well for seated therex. Pt will benefit from PT services in a SNF setting upon discharge to safely address deficits listed in patient problem list for decreased caregiver assistance and eventual return to PLOF.     Follow Up Recommendations SNF;Supervision/Assistance - 24 hour    Equipment Recommendations  None recommended by PT (tbd at next venue of care)    Recommendations for Other Services       Precautions / Restrictions Precautions Precautions:  Fall Restrictions Weight Bearing Restrictions: No      Mobility  Bed Mobility Overal bed mobility: Needs Assistance Bed Mobility: Supine to Sit     Supine to sit: Min assist     General bed mobility comments: Pt moved quickly across bed; almost swung himself over the edge of the bed. Required min-A to keep him on bed as he came to full sit.  Transfers Overall transfer level: Needs assistance Equipment used: Rolling walker (2 wheeled) Transfers: Sit to/from Stand Sit to Stand: Min assist;Mod assist         General transfer comment: Pt generally unsteady; required cueing for hand positioning. Overall poor eccentric control. Min-mod assist to prevent posterior LOB  Ambulation/Gait Ambulation/Gait assistance: Min guard Gait Distance (Feet): 5 Feet; 32ft Assistive device: Rolling walker (2 wheeled) Gait Pattern/deviations: Step-through pattern;Decreased step length - right;Decreased step length - left;Wide base of support Gait velocity: decreased   General Gait Details: Pt had a wide base of support and had a difficult time keeping legs w/in frame of RW; pt currently demonstrated small, effortful, steps w/ close CGA 2/2 unsteadiness  Stairs            Wheelchair Mobility    Modified Rankin (Stroke Patients Only)       Balance Overall balance assessment: Needs assistance Sitting-balance support: Feet supported;No upper extremity supported Sitting balance-Leahy Scale: Good       Standing balance-Leahy Scale: Fair Standing balance comment: Pt able to shift weight through BLE; required BUE support on RW to maintain standing  Pertinent Vitals/Pain Pain Assessment: No/denies pain    Home Living Family/patient expects to be discharged to:: Private residence Living Arrangements: Other relatives (cousin) Available Help at Discharge: Family;Available 24 hours/day Type of Home: House Home Access: Stairs to enter Entrance  Stairs-Rails: Left;Right;Can reach both Entrance Stairs-Number of Steps: 2 (primary entrance); ramp to back door Home Layout: Two level;Able to live on main level with bedroom/bathroom Home Equipment: None Additional Comments: Per pt's father; pt lives w/ cousin who is home 24/7 (works from home); uncle, aunt, and other cousin are also avail to help PRN    Prior Function Level of Independence: Independent         Comments: Per pt & father pt was previously independent w/ no AD and walked upwards of 2-82mi/d. Pt stated he did not need help w/ anything at home, independent w/ ADLs.     Hand Dominance        Extremity/Trunk Assessment   Upper Extremity Assessment Upper Extremity Assessment: Generalized weakness    Lower Extremity Assessment Lower Extremity Assessment: Generalized weakness       Communication   Communication: Other (comment) (autistim - see cognition)  Cognition Arousal/Alertness: Awake/alert Behavior During Therapy: WFL for tasks assessed/performed Overall Cognitive Status: History of cognitive impairments - at baseline                                 General Comments: Pt has autism, father present for session. Pt communicates primarily w/ 1-2 word phrases (i.e. "no weak, no pain"). Father states that pt's verbal communication can be unreliable. Pt easily follows simple commands throughout session.      General Comments      Exercises Total Joint Exercises Ankle Circles/Pumps: AROM;Strengthening;Both;10 reps Quad Sets: Strengthening;Both;10 reps Hip ABduction/ADduction: Strengthening;AROM;Both;10 reps Straight Leg Raises: AAROM;Strengthening;Both;10 reps   Assessment/Plan    PT Assessment Patient needs continued PT services  PT Problem List Decreased strength;Decreased activity tolerance;Decreased balance;Decreased knowledge of use of DME;Decreased mobility       PT Treatment Interventions Gait training;Stair training;Functional  mobility training;Therapeutic activities;Patient/family education;Balance training;Therapeutic exercise;DME instruction    PT Goals (Current goals can be found in the Care Plan section)  Acute Rehab PT Goals PT Goal Formulation: Patient unable to participate in goal setting    Frequency Min 2X/week   Barriers to discharge        Co-evaluation               AM-PAC PT "6 Clicks" Mobility  Outcome Measure Help needed turning from your back to your side while in a flat bed without using bedrails?: A Little Help needed moving from lying on your back to sitting on the side of a flat bed without using bedrails?: A Little Help needed moving to and from a bed to a chair (including a wheelchair)?: A Little Help needed standing up from a chair using your arms (e.g., wheelchair or bedside chair)?: A Little Help needed to walk in hospital room?: A Lot Help needed climbing 3-5 steps with a railing? : Total 6 Click Score: 15    End of Session Equipment Utilized During Treatment: Gait belt Activity Tolerance: Patient tolerated treatment well Patient left: in chair;with call bell/phone within reach;with chair alarm set;with family/visitor present;Other (comment) (w/ cooling blanket on chair) Nurse Communication: Mobility status PT Visit Diagnosis: Unsteadiness on feet (R26.81);Muscle weakness (generalized) (M62.81);Difficulty in walking, not elsewhere classified (R26.2)    Time: 6546-5035 PT  Time Calculation (min) (ACUTE ONLY): 31 min   Charges:              Shameika Speelman SPT 04/08/20, 1:11 PM

## 2020-04-08 NOTE — Care Management Important Message (Signed)
Important Message  Patient Details  Name: Joseph Gilmore MRN: [id number] Date of Birth: 21-Nov-1970   Medicare Important Message Given:  Yes     Loann Quill 04/08/2020, 11:15 AM

## 2020-04-08 NOTE — Progress Notes (Signed)
ID Says he is fine   Patient Vitals for the past 24 hrs:  BP Temp Temp src Pulse Resp SpO2 Weight  04/08/20 1505 117/64 98.2 F (36.8 C) Oral 77 17 100 % --  04/08/20 1142 110/77 98.5 F (36.9 C) Oral 73 17 93 % --  04/08/20 0742 (!) 159/96 (!) 97.4 F (36.3 C) Oral 95 18 93 % --  04/08/20 0512 (!) 159/97 -- -- 84 -- -- --  04/08/20 0441 (!) 181/109 97.9 F (36.6 C) -- 85 20 97 % 100.4 kg  04/08/20 0022 (!) 163/107 -- -- 85 -- -- --  04/08/20 0006 (!) 172/113 -- -- 85 -- -- --  04/08/20 0004 (!) 164/110 99 F (37.2 C) -- 82 18 95 % --  04/07/20 1928 (!) 161/95 99.1 F (37.3 C) Oral 98 20 97 % --  04/07/20 1709 (!) 160/96 99.5 F (37.5 C) -- 94 18 97 % --   O/e Awake and alert Autism Chest b/l air entry HSs 1s2 abd soft Blisters rt forearm  labs  CBC Latest Ref Rng & Units 04/08/2020 04/07/2020 04/06/2020  WBC 4.0 - 10.5 K/uL 13.0(H) 12.8(H) 13.9(H)  Hemoglobin 13.0 - 17.0 g/dL 10.0(L) 8.9(L) 9.6(L)  Hematocrit 39 - 52 % 32.2(L) 26.7(L) 29.4(L)  Platelets 150 - 400 K/uL 161 168 178     CMP Latest Ref Rng & Units 04/08/2020 04/07/2020 04/06/2020  Glucose 70 - 99 mg/dL 104(H) 100(H) 106(H)  BUN 6 - 20 mg/dL 46(H) 50(H) 69(H)  Creatinine 0.61 - 1.24 mg/dL 8.29(H) 10.59(H) 13.38(H)  Sodium 135 - 145 mmol/L 140 139 142  Potassium 3.5 - 5.1 mmol/L 4.3 4.0 3.7  Chloride 98 - 111 mmol/L 98 99 101  CO2 22 - 32 mmol/L 28 25 26   Calcium 8.9 - 10.3 mg/dL 6.5(L) 5.6(LL) 4.9(LL)  Total Protein 6.5 - 8.1 g/dL 7.0 6.2(L) 6.2(L)  Total Bilirubin 0.3 - 1.2 mg/dL 0.8 0.9 0.7  Alkaline Phos 38 - 126 U/L 55 44 48  AST 15 - 41 U/L 32 31 22  ALT 0 - 44 U/L 26 29 28     Impression/Recommendation ? ?AKI with malignant HTN- started dialysis  Picture looks more like chronic kidney disease from the [place] rather than acute kidney injury. p Pt has not been to a PCP in atleast 5 years so dont know his baseline creatinine.  New fever- none in 24 hrs- blood culture neg so far with BPH and outlet  obstruction UTI would be a concern but U/A no wbc. Culture pending No pneumonia. SARS cov2 neg, does not have diarrhea- so the CT finding of fluid in the intestine not significant . Continue vanco and ceftriaxone Has blistering lesions rt forearm secondary to medication extravasation No erythema   HTN diagnosed on presentaion- on amlodipine, hydralazine and metoprolol  BPH_ on flomax and avodart  Anemia of CKD  Autism  Discussed with his nurse

## 2020-04-08 NOTE — Progress Notes (Signed)
PROGRESS NOTE    Joseph Gilmore  EZM:[id number] DOB: [birth year] DOA: 04/03/2020 PCP: [facility]   Assessment & Plan:   Principal Problem:   AKI (acute kidney injury) ([place]) Active Problems:   Metabolic acidosis   Anemia of chronic disease   Elevated LFTs   Elevated lipase   Hypertensive urgency   Vomiting   BPH (benign prostatic hyperplasia)   Fever   Enteritis   Non-traumatic rhabdomyolysis   Weakness AKI w/ rhabdomyolysis:  s/p permcath placed. Started on HD as per nephro   Fever: etiology unclear. Recent of tooth pain & suppose to have dental work done but was canceled for some reason as per pt's father. Continue on IV rocephin & vancomycin. Blood cxs NGTD. CXR neg.  CT scan the other day showed possible enteritis. ID following and recs apprec  BPH: continue w/ foley. Continue on flomax and proscar. W/ elevated PSA, urology will f/u as an outpatient  ACD: H&H are trending up. Will continue to monitor   HTN: continue on amlodipine and metoprolol  Weakness: PT consulted    DVT prophylaxis: heparin Code Status: full Family Communication: discussed pt's care w/ pt's father, [name], and answered his questions Disposition Plan:  Depends on PT/OT recs   Consultants:   nephro   Vascular surg  ID   Procedures:   Antimicrobials: rocephin, vancomycin    Subjective: Pt c/o fatigue  Objective: Vitals:   04/08/20 0441 04/08/20 0512 04/08/20 0742 04/08/20 1142  BP: (!) 181/109 (!) 159/97 (!) 159/96 110/77  Pulse: 85 84 95 73  Resp: 20  18 17   Temp: 97.9 F (36.6 C)  (!) 97.4 F (36.3 C) 98.5 F (36.9 C)  TempSrc:   Oral Oral  SpO2: 97%  93% 93%  Weight: 100.4 kg     Height:        Intake/Output Summary (Last 24 hours) at 04/08/2020 1155 Last data filed at 04/08/2020 1034 Gross per 24 hour  Intake 592.62 ml  Output 1000 ml  Net -407.38 ml   Filed Weights   04/06/20 1953 04/07/20 0455 04/08/20 0441  Weight: 113.9 kg 114.7 kg 100.4 kg     Examination:  General exam: Appears calm and comfortable  Respiratory system: Clear to auscultation. No wheezes, rales Cardiovascular system: S1 & S2+. No rubs, gallops or clicks. Gastrointestinal system: Abdomen is obese, soft and nontender. Normal bowel sounds heard. Central nervous system: Alert and oriented. Moves all 4 extremities Psychiatry: Judgement and insight appear normal. Mood & affect appropriate.     Data Reviewed: I have personally reviewed following labs and imaging studies  CBC: Recent Labs  Lab 04/04/20 0916 04/04/20 0916 04/05/20 0825 04/06/20 0527 04/06/20 2055 04/07/20 0413 04/08/20 0634  WBC 10.6*   < > 9.6 10.8* 13.9* 12.8* 13.0*  NEUTROABS 8.2*  --  6.8 7.4  --  9.6*  --   HGB 9.3*   < > 8.7* 8.4* 9.6* 8.9* 10.0*  HCT 27.5*   < > 25.7* 26.1* 29.4* 26.7* 32.2*  MCV 81.8   < > 82.6 86.1 85.2 85.3 88.2  PLT 163   < > 162 171 178 168 161   < > = values in this interval not displayed.   Basic Metabolic Panel: Recent Labs  Lab 04/03/20 0721 04/03/20 0721 04/03/20 1809 04/04/20 0916 04/05/20 0825 04/06/20 0527 04/07/20 0413 04/08/20 0634  NA 140   < >  --  141 139 142 139 140  K 4.0   < >  --  3.6 3.2* 3.7 4.0 4.3  CL 111   < >  --  107 102 101 99 98  CO2 14*   < >  --  16* 21* 26 25 28   GLUCOSE 108*   < >  --  102* 102* 106* 100* 104*  BUN 74*   < >  --  72* 69* 69* 50* 46*  CREATININE 15.07*   < >  --  14.15* 14.11* 13.38* 10.59* 8.29*  CALCIUM 4.4*   < >  --  4.6* 4.8* 4.9* 5.6* 6.5*  MG 1.5*  --   --  1.5*  --  1.5* 1.6* 2.0  PHOS  --   --  6.3*  --  7.0*  --   --  5.3*   < > = values in this interval not displayed.   GFR: Estimated Creatinine Clearance: 12.5 mL/min (A) (by C-G formula based on SCr of 8.29 mg/dL (H)). Liver Function Tests: Recent Labs  Lab 04/03/20 0248 04/03/20 0248 04/04/20 0916 04/05/20 0825 04/06/20 0527 04/07/20 0413 04/08/20 0634  AST 48*  --  22  --  22 31 32  ALT 66*  --  36  --  28 29 26   ALKPHOS  62  --  51  --  48 44 55  BILITOT 0.8  --  0.8  --  0.7 0.9 0.8  PROT 7.6  --  6.4*  --  6.2* 6.2* 7.0  ALBUMIN 3.9   < > 3.4* 3.1* 3.3* 3.1* 3.3*   < > = values in this interval not displayed.   Recent Labs  Lab 04/02/20 2248 04/04/20 1357  LIPASE 90* 71*   No results for input(s): AMMONIA in the last 168 hours. Coagulation Profile: Recent Labs  Lab 04/06/20 0527  INR 1.2   Cardiac Enzymes: Recent Labs  Lab 04/03/20 0721 04/04/20 1357 04/06/20 0730 04/08/20 0634  CKTOTAL 2,732* 2,334* 2,023* 1,168*   BNP (last 3 results) No results for input(s): PROBNP in the last 8760 hours. HbA1C: No results for input(s): HGBA1C in the last 72 hours. CBG: No results for input(s): GLUCAP in the last 168 hours. Lipid Profile: No results for input(s): CHOL, HDL, LDLCALC, TRIG, CHOLHDL, LDLDIRECT in the last 72 hours. Thyroid Function Tests: No results for input(s): TSH, T4TOTAL, FREET4, T3FREE, THYROIDAB in the last 72 hours. Anemia Panel: No results for input(s): VITAMINB12, FOLATE, FERRITIN, TIBC, IRON, RETICCTPCT in the last 72 hours. Sepsis Labs: Recent Labs  Lab 04/07/20 0413 04/07/20 0717 04/08/20 0634  PROCALCITON 0.77  --  0.98  LATICACIDVEN 0.8 0.8  --     Recent Results (from the past 240 hour(s))  SARS Coronavirus 2 by RT PCR (hospital order, performed in [facility] hospital lab) Nasopharyngeal Nasopharyngeal Swab     Status: None   Collection Time: 04/03/20  3:06 AM   Specimen: Nasopharyngeal Swab  Result Value Ref Range Status   SARS Coronavirus 2 NEGATIVE NEGATIVE Final    Comment: (NOTE) SARS-CoV-2 target nucleic acids are NOT DETECTED.  The SARS-CoV-2 RNA is generally detectable in upper and lower respiratory specimens during the acute phase of infection. The lowest concentration of SARS-CoV-2 viral copies this assay can detect is 250 copies / mL. A negative result does not preclude SARS-CoV-2 infection and should not be used as the sole basis for  treatment or other patient management decisions.  A negative result may occur with improper specimen collection / handling, submission of specimen other than nasopharyngeal swab, presence of viral  mutation(s) within the areas targeted by this assay, and inadequate number of viral copies (<250 copies / mL). A negative result must be combined with clinical observations, patient history, and epidemiological information.  Fact Sheet for Patients:   StrictlyIdeas.no  Fact Sheet for Healthcare Providers: BankingDealers.co.za  This test is not yet approved or  cleared by the [place] FDA and has been authorized for detection and/or diagnosis of SARS-CoV-2 by FDA under an Emergency Use Authorization (EUA).  This EUA will remain in effect (meaning this test can be used) for the duration of the COVID-19 declaration under Section 564(b)(1) of the Act, 21 U.S.C. section 360bbb-3(b)(1), unless the authorization is terminated or revoked sooner.  Performed at [facility], [address] [place], [place] [address]   Culture, blood (Routine X 2) w Reflex to ID Panel     Status: None (Preliminary result)   Collection Time: 04/05/20  5:08 PM   Specimen: BLOOD  Result Value Ref Range Status   Specimen Description BLOOD LAC  Final   Special Requests   Final    BOTTLES DRAWN AEROBIC AND ANAEROBIC Blood Culture adequate volume   Culture   Final    NO GROWTH 3 DAYS Performed at [facility], [address], [place], [place] [address]    Report Status PENDING  Incomplete  CULTURE, BLOOD (ROUTINE X 2) w Reflex to ID Panel     Status: None (Preliminary result)   Collection Time: 04/06/20  7:30 AM   Specimen: BLOOD  Result Value Ref Range Status   Specimen Description BLOOD LEFT ARM  Final   Special Requests   Final    BOTTLES DRAWN AEROBIC AND ANAEROBIC Blood Culture results may not be optimal due to an excessive volume of blood  received in culture bottles   Culture   Final    NO GROWTH 2 DAYS Performed at [facility], [address] [place], [place] [address]    Report Status PENDING  Incomplete  CULTURE, BLOOD (ROUTINE X 2) w Reflex to ID Panel     Status: None (Preliminary result)   Collection Time: 04/06/20  8:28 AM   Specimen: BLOOD  Result Value Ref Range Status   Specimen Description BLOOD BLOOD RIGHT HAND  Final   Special Requests   Final    BOTTLES DRAWN AEROBIC AND ANAEROBIC Blood Culture adequate volume   Culture   Final    NO GROWTH 2 DAYS Performed at [facility], [address] [place], [place] [address]    Report Status PENDING  Incomplete  Urine Culture     Status: Abnormal   Collection Time: 04/07/20  6:02 AM   Specimen: Urine, Random  Result Value Ref Range Status   Specimen Description   Final    URINE, RANDOM Performed at [facility], [address], [place], [place] [address]    Special Requests   Final    NONE Performed at [facility], [address] [place],  [address]    Culture MULTIPLE SPECIES PRESENT, SUGGEST RECOLLECTION (A)  Final   Report Status 04/08/2020 FINAL  Final         Radiology Studies: PERIPHERAL VASCULAR CATHETERIZATION  Result Date: 04/06/2020 See op note  US Venous Img Lower Bilateral (DVT)  Result Date: 04/08/2020 CLINICAL DATA:  49 year old male with a history fever EXAM: BILATERAL LOWER EXTREMITY VENOUS DOPPLER ULTRASOUND TECHNIQUE: Gray-scale sonography with graded compression, as well as color Doppler and duplex ultrasound were performed  to evaluate the lower extremity deep venous systems from the level of the common femoral vein and including the common femoral, femoral, profunda femoral, popliteal and calf veins including the posterior tibial, peroneal and gastrocnemius veins when visible. The superficial great saphenous vein was also interrogated. Spectral Doppler was utilized to evaluate flow  at rest and with distal augmentation maneuvers in the common femoral, femoral and popliteal veins. COMPARISON:  None. FINDINGS: RIGHT LOWER EXTREMITY Common Femoral Vein: No evidence of thrombus. Normal compressibility, respiratory phasicity and response to augmentation. Saphenofemoral Junction: No evidence of thrombus. Normal compressibility and flow on color Doppler imaging. Profunda Femoral Vein: No evidence of thrombus. Normal compressibility and flow on color Doppler imaging. Femoral Vein: No evidence of thrombus. Normal compressibility, respiratory phasicity and response to augmentation. Popliteal Vein: No evidence of thrombus. Normal compressibility, respiratory phasicity and response to augmentation. Calf Veins: No evidence of thrombus. Normal compressibility and flow on color Doppler imaging. Superficial Great Saphenous Vein: No evidence of thrombus. Normal compressibility and flow on color Doppler imaging. Other Findings:  None. LEFT LOWER EXTREMITY Common Femoral Vein: No evidence of thrombus. Normal compressibility, respiratory phasicity and response to augmentation. Saphenofemoral Junction: No evidence of thrombus. Normal compressibility and flow on color Doppler imaging. Profunda Femoral Vein: No evidence of thrombus. Normal compressibility and flow on color Doppler imaging. Femoral Vein: No evidence of thrombus. Normal compressibility, respiratory phasicity and response to augmentation. Popliteal Vein: No evidence of thrombus. Normal compressibility, respiratory phasicity and response to augmentation. Calf Veins: No evidence of thrombus. Normal compressibility and flow on color Doppler imaging. Superficial Great Saphenous Vein: No evidence of thrombus. Normal compressibility and flow on color Doppler imaging. Other Findings:  None. IMPRESSION: Sonographic survey of the bilateral lower extremities negative for DVT Electronically Signed   By: Corrie Mckusick D.O.   On: 04/08/2020 10:04         Scheduled Meds: . amLODipine  10 mg Oral Daily  . calcium acetate  1,334 mg Oral TID WC  . dutasteride  0.5 mg Oral Daily  . heparin  5,000 Units Subcutaneous Q8H  . hydrALAZINE  10 mg Oral Q8H  . magnesium oxide  400 mg Oral Daily  . metoprolol tartrate  100 mg Oral BID  . multivitamin  1 tablet Oral QHS  . sodium chloride flush  10 mL Intravenous Q12H  . tamsulosin  0.4 mg Oral Daily  . vancomycin variable dose per unstable renal function (pharmacist dosing)   Does not apply See admin instructions   Continuous Infusions: . sodium chloride Stopped (04/07/20 0320)  . cefTRIAXone (ROCEPHIN)  IV 2 g (04/07/20 1802)     LOS: 5 days    Time spent: 33 mins    Wyvonnia Dusky, MD [facility] Pager [phone]  If 7PM-7AM, please contact night-coverage [email or web address] 04/08/2020, 11:55 AM

## 2020-04-08 NOTE — Progress Notes (Signed)
[facility]  ROUNDING NOTE   Subjective:     Third hemodialysis treatment for today.   Objective:  Vital signs in last 24 hours:  Temp:  [97.4 F (36.3 C)-100.2 F (37.9 C)] 98.5 F (36.9 C) (08/11 1142) Pulse Rate:  [73-98] 73 (08/11 1142) Resp:  [17-20] 17 (08/11 1142) BP: (110-181)/(77-113) 110/77 (08/11 1142) SpO2:  [93 %-97 %] 93 % (08/11 1142) Weight:  [100.4 kg] 100.4 kg (08/11 0441)  Weight change: -13.5 kg Filed Weights   04/06/20 1953 04/07/20 0455 04/08/20 0441  Weight: 113.9 kg 114.7 kg 100.4 kg    Intake/Output: I/O last 3 completed shifts: In: 339.8 [I.V.:7.2; IV Piggyback:332.6] Out: 2400 [Urine:1400; Other:1000]   Intake/Output this shift:  Total I/O In: 360 [P.O.:360] Out: -   Physical Exam: General: NAD, laying in bed  Head: Normocephalic, atraumatic. Moist oral mucosal membranes  Eyes: Anicteric, PERRL  Neck: Supple, trachea midline  Lungs:  Clear to auscultation  Heart: Regular rate and rhythm  Abdomen:  Soft, nontender,   Extremities:  no peripheral edema.  Neurologic: +learning disability, unable to give history  Skin: No lesions  Access RIJ permcath 8/9 Dr. Lucky Cowboy  GU Foley catheter with urine clear    Basic Metabolic Panel: Recent Labs  Lab 04/03/20 0721 04/03/20 0721 04/03/20 1809 04/04/20 0916 04/04/20 0916 04/05/20 0825 04/05/20 0825 04/06/20 0527 04/07/20 0413 04/08/20 0634  NA 140   < >  --  141  --  139  --  142 139 140  K 4.0   < >  --  3.6  --  3.2*  --  3.7 4.0 4.3  CL 111   < >  --  107  --  102  --  101 99 98  CO2 14*   < >  --  16*  --  21*  --  26 25 28   GLUCOSE 108*   < >  --  102*  --  102*  --  106* 100* 104*  BUN 74*   < >  --  72*  --  69*  --  69* 50* 46*  CREATININE 15.07*   < >  --  14.15*  --  14.11*  --  13.38* 10.59* 8.29*  CALCIUM 4.4*   < >  --  4.6*   < > 4.8*   < > 4.9* 5.6* 6.5*  MG 1.5*  --   --  1.5*  --   --   --  1.5* 1.6* 2.0  PHOS  --   --  6.3*  --   --  7.0*  --   --   --   5.3*   < > = values in this interval not displayed.    Liver Function Tests: Recent Labs  Lab 04/03/20 0248 04/03/20 0248 04/04/20 0916 04/05/20 0825 04/06/20 0527 04/07/20 0413 04/08/20 0634  AST 48*  --  22  --  22 31 32  ALT 66*  --  36  --  28 29 26   ALKPHOS 62  --  51  --  48 44 55  BILITOT 0.8  --  0.8  --  0.7 0.9 0.8  PROT 7.6  --  6.4*  --  6.2* 6.2* 7.0  ALBUMIN 3.9   < > 3.4* 3.1* 3.3* 3.1* 3.3*   < > = values in this interval not displayed.   Recent Labs  Lab 04/02/20 2248 04/04/20 1357  LIPASE 90* 71*   No results for  input(s): AMMONIA in the last 168 hours.  CBC: Recent Labs  Lab 04/04/20 0916 04/04/20 0916 04/05/20 0825 04/06/20 0527 04/06/20 2055 04/07/20 0413 04/08/20 0634  WBC 10.6*   < > 9.6 10.8* 13.9* 12.8* 13.0*  NEUTROABS 8.2*  --  6.8 7.4  --  9.6*  --   HGB 9.3*   < > 8.7* 8.4* 9.6* 8.9* 10.0*  HCT 27.5*   < > 25.7* 26.1* 29.4* 26.7* 32.2*  MCV 81.8   < > 82.6 86.1 85.2 85.3 88.2  PLT 163   < > 162 171 178 168 161   < > = values in this interval not displayed.    Cardiac Enzymes: Recent Labs  Lab 04/03/20 0721 04/04/20 1357 04/06/20 0730 04/08/20 0634  CKTOTAL 2,732* 2,334* 2,023* 1,168*    BNP: Invalid input(s): POCBNP  CBG: No results for input(s): GLUCAP in the last 168 hours.  Microbiology: Results for orders placed or performed during the hospital encounter of 04/03/20  SARS Coronavirus 2 by RT PCR (hospital order, performed in [facility] hospital lab) Nasopharyngeal Nasopharyngeal Swab     Status: None   Collection Time: 04/03/20  3:06 AM   Specimen: Nasopharyngeal Swab  Result Value Ref Range Status   SARS Coronavirus 2 NEGATIVE NEGATIVE Final    Comment: (NOTE) SARS-CoV-2 target nucleic acids are NOT DETECTED.  The SARS-CoV-2 RNA is generally detectable in upper and lower respiratory specimens during the acute phase of infection. The lowest concentration of SARS-CoV-2 viral copies this assay can detect is  250 copies / mL. A negative result does not preclude SARS-CoV-2 infection and should not be used as the sole basis for treatment or other patient management decisions.  A negative result may occur with improper specimen collection / handling, submission of specimen other than nasopharyngeal swab, presence of viral mutation(s) within the areas targeted by this assay, and inadequate number of viral copies (<250 copies / mL). A negative result must be combined with clinical observations, patient history, and epidemiological information.  Fact Sheet for Patients:   StrictlyIdeas.no  Fact Sheet for Healthcare Providers: BankingDealers.co.za  This test is not yet approved or  cleared by the [place] FDA and has been authorized for detection and/or diagnosis of SARS-CoV-2 by FDA under an Emergency Use Authorization (EUA).  This EUA will remain in effect (meaning this test can be used) for the duration of the COVID-19 declaration under Section 564(b)(1) of the Act, 21 U.S.C. section 360bbb-3(b)(1), unless the authorization is terminated or revoked sooner.  Performed at [facility], [address] [place], [place] [address]   Culture, blood (Routine X 2) w Reflex to ID Panel     Status: None (Preliminary result)   Collection Time: 04/05/20  5:08 PM   Specimen: BLOOD  Result Value Ref Range Status   Specimen Description BLOOD LAC  Final   Special Requests   Final    BOTTLES DRAWN AEROBIC AND ANAEROBIC Blood Culture adequate volume   Culture   Final    NO GROWTH 3 DAYS Performed at [facility], [address], [place], [place] [address]    Report Status PENDING  Incomplete  CULTURE, BLOOD (ROUTINE X 2) w Reflex to ID Panel     Status: None (Preliminary result)   Collection Time: 04/06/20  7:30 AM   Specimen: BLOOD  Result Value Ref Range Status   Specimen Description BLOOD LEFT ARM  Final   Special Requests    Final  BOTTLES DRAWN AEROBIC AND ANAEROBIC Blood Culture results may not be optimal due to an excessive volume of blood received in culture bottles   Culture   Final    NO GROWTH 2 DAYS Performed at [facility], [address] [place], [place] [address]    Report Status PENDING  Incomplete  CULTURE, BLOOD (ROUTINE X 2) w Reflex to ID Panel     Status: None (Preliminary result)   Collection Time: 04/06/20  8:28 AM   Specimen: BLOOD  Result Value Ref Range Status   Specimen Description BLOOD BLOOD RIGHT HAND  Final   Special Requests   Final    BOTTLES DRAWN AEROBIC AND ANAEROBIC Blood Culture adequate volume   Culture   Final    NO GROWTH 2 DAYS Performed at [facility], [address] [place], [place] [address]    Report Status PENDING  Incomplete  Urine Culture     Status: Abnormal   Collection Time: 04/07/20  6:02 AM   Specimen: Urine, Random  Result Value Ref Range Status   Specimen Description   Final    URINE, RANDOM Performed at [facility], [address] [place], [place] [address]    Special Requests   Final    NONE Performed at [facility], [address], [place],  [address]    Culture MULTIPLE SPECIES PRESENT, SUGGEST RECOLLECTION (A)  Final   Report Status 04/08/2020 FINAL  Final    Coagulation Studies: Recent Labs    04/06/20 0527  LABPROT 15.1  INR 1.2    Urinalysis: No results for input(s): COLORURINE, LABSPEC, PHURINE, GLUCOSEU, HGBUR, BILIRUBINUR, KETONESUR, PROTEINUR, UROBILINOGEN, NITRITE, LEUKOCYTESUR in the last 72 hours.  Invalid input(s): APPERANCEUR    Imaging: PERIPHERAL VASCULAR CATHETERIZATION  Result Date: 04/06/2020 See op note  US Venous Img Lower Bilateral (DVT)  Result Date: 04/08/2020 CLINICAL DATA:  49 year old male with a history fever EXAM: BILATERAL LOWER EXTREMITY VENOUS DOPPLER ULTRASOUND TECHNIQUE: Gray-scale sonography with graded compression, as well as color  Doppler and duplex ultrasound were performed to evaluate the lower extremity deep venous systems from the level of the common femoral vein and including the common femoral, femoral, profunda femoral, popliteal and calf veins including the posterior tibial, peroneal and gastrocnemius veins when visible. The superficial great saphenous vein was also interrogated. Spectral Doppler was utilized to evaluate flow at rest and with distal augmentation maneuvers in the common femoral, femoral and popliteal veins. COMPARISON:  None. FINDINGS: RIGHT LOWER EXTREMITY Common Femoral Vein: No evidence of thrombus. Normal compressibility, respiratory phasicity and response to augmentation. Saphenofemoral Junction: No evidence of thrombus. Normal compressibility and flow on color Doppler imaging. Profunda Femoral Vein: No evidence of thrombus. Normal compressibility and flow on color Doppler imaging. Femoral Vein: No evidence of thrombus. Normal compressibility, respiratory phasicity and response to augmentation. Popliteal Vein: No evidence of thrombus. Normal compressibility, respiratory phasicity and response to augmentation. Calf Veins: No evidence of thrombus. Normal compressibility and flow on color Doppler imaging. Superficial Great Saphenous Vein: No evidence of thrombus. Normal compressibility and flow on color Doppler imaging. Other Findings:  None. LEFT LOWER EXTREMITY Common Femoral Vein: No evidence of thrombus. Normal compressibility, respiratory phasicity and response to augmentation. Saphenofemoral Junction: No evidence of thrombus. Normal compressibility and flow on color Doppler imaging. Profunda Femoral Vein: No evidence of thrombus. Normal compressibility and flow on color Doppler imaging. Femoral Vein: No evidence of thrombus. Normal compressibility, respiratory phasicity and response to augmentation. Popliteal Vein: No  evidence of thrombus. Normal compressibility, respiratory phasicity and response to  augmentation. Calf Veins: No evidence of thrombus. Normal compressibility and flow on color Doppler imaging. Superficial Great Saphenous Vein: No evidence of thrombus. Normal compressibility and flow on color Doppler imaging. Other Findings:  None. IMPRESSION: Sonographic survey of the bilateral lower extremities negative for DVT Electronically Signed   By: Corrie Mckusick D.O.   On: 04/08/2020 10:04   CT MAXILLOFACIAL WO CONTRAST  Result Date: 04/08/2020 CLINICAL DATA:  Rule out tooth abscess. Tooth pain. Dialysis patient. EXAM: CT MAXILLOFACIAL WITHOUT CONTRAST TECHNIQUE: Multidetector CT imaging of the maxillofacial structures was performed. Multiplanar CT image reconstructions were also generated. COMPARISON:  None. FINDINGS: Osseous: Negative for fracture. Periapical lucency around right lower molar compatible with dental infection. No adjacent soft tissue swelling. No other dental lesion. Orbits: Normal orbit.  Negative for soft tissue mass or edema. Sinuses: Paranasal sinuses clear.  Mastoid clear bilaterally. Soft tissues: No soft tissue swelling or soft tissue abscess adjacent to the right lower molar periapical lucency. Dialysis central venous catheter from a right jugular approach. Tip not visualized. No hematoma or mass in the neck. Limited intracranial: Negative for acute abnormality. Chronic infarct left basal ganglia. IMPRESSION: Periapical lucency around right lower molar compatible with dental infection. No soft tissue edema or abscess identified. Electronically Signed   By: Franchot Gallo M.D.   On: 04/08/2020 14:09     Medications:   . sodium chloride Stopped (04/07/20 0320)  . cefTRIAXone (ROCEPHIN)  IV 2 g (04/07/20 1802)  . vancomycin     . amLODipine  10 mg Oral Daily  . calcium acetate  1,334 mg Oral TID WC  . dutasteride  0.5 mg Oral Daily  . heparin  5,000 Units Subcutaneous Q8H  . hydrALAZINE  10 mg Oral Q8H  . magnesium oxide  400 mg Oral Daily  . metoprolol tartrate   100 mg Oral BID  . multivitamin  1 tablet Oral QHS  . sodium chloride flush  10 mL Intravenous Q12H  . tamsulosin  0.4 mg Oral Daily  . vancomycin variable dose per unstable renal function (pharmacist dosing)   Does not apply See admin instructions   sodium chloride, acetaminophen **OR** acetaminophen, hydrALAZINE, HYDROmorphone (DILAUDID) injection, LORazepam, ondansetron (ZOFRAN) IV  Assessment/ Plan:  Joseph Gilmore is a 49 y.o. black male with learning disability, autism, hypertension, obsessive compulsive disorder who is admitted to [facility] on 04/03/2020 for Hypocalcemia [E83.51] Enteritis [K52.9] Prolonged Q-T interval on ECG [R94.31] AKI (acute kidney injury) ([place]) [N17.9] Acute kidney injury ([place]) [N17.9]  1.  Acute kidney injury with metabolic acidosis and proteinuria. No known baseline creatinine.  Renal ultrasound consistent with chronic kidney disease.  No IV contrast exposure.  Acute renal failure seems to be secondary to obstructive uropathy. Foley catheter placed Work up so far with negative for viral hepatitis, HIV negative, SPEP/UPEP, ANA, anti-GBM, serum complements.  Pending ANCA.  Nonoliguric urine output.    - Dialysis for today. Third treatment.   2. Secondary Hyperparathyroidism with Hypocalcemia and hyperphosphatemia Required calcium gluconate - Continue PO calcium acetate with meals.   3. Hypertension: emergency on admission.  Current regimen of metoprolol, hydralazine, amlodipine and tamsulosin. Holding ACE-I/ARB and diuretics due to renal failure at this time.  - metoprolol to 100mg  bid PO  4. Anemia with renal failure and iron deficiency: folic acid level is also low.  - scheduled EPO with dialysis treatment - Continue renal vitamin.    5. Obstructive uropathy:  foley catheter placed - started on tamsulosin and dutasteride this admission - Appreciate urology input.     LOS: 5 Windel Keziah 8/11/20212:59 PM

## 2020-04-09 DIAGNOSIS — N138 Other obstructive and reflux uropathy: Secondary | ICD-10-CM | POA: Diagnosis not present

## 2020-04-09 DIAGNOSIS — N401 Enlarged prostate with lower urinary tract symptoms: Secondary | ICD-10-CM | POA: Diagnosis not present

## 2020-04-09 DIAGNOSIS — D696 Thrombocytopenia, unspecified: Secondary | ICD-10-CM

## 2020-04-09 DIAGNOSIS — N186 End stage renal disease: Secondary | ICD-10-CM | POA: Diagnosis not present

## 2020-04-09 LAB — BASIC METABOLIC PANEL
Anion gap: 14 (ref 5–15)
BUN: 36 mg/dL — ABNORMAL HIGH (ref 6–20)
CO2: 27 mmol/L (ref 22–32)
Calcium: 6.6 mg/dL — ABNORMAL LOW (ref 8.9–10.3)
Chloride: 97 mmol/L — ABNORMAL LOW (ref 98–111)
Creatinine, Ser: 7.05 mg/dL — ABNORMAL HIGH (ref 0.61–1.24)
GFR calc Af Amer: 10 mL/min — ABNORMAL LOW (ref >60)
GFR calc non Af Amer: 8 mL/min — ABNORMAL LOW (ref >60)
Glucose, Bld: 89 mg/dL (ref 70–99)
Potassium: 3.7 mmol/L (ref 3.5–5.1)
Sodium: 138 mmol/L (ref 135–145)

## 2020-04-09 LAB — MPO/PR-3 (ANCA) ANTIBODIES
ANCA Proteinase 3: <3.5 U/mL (ref 0.0–3.5)
Myeloperoxidase Abs: <9 U/mL (ref 0.0–9.0)

## 2020-04-09 LAB — CBC
HCT: 26.7 % — ABNORMAL LOW (ref 39.0–52.0)
Hemoglobin: 8.9 g/dL — ABNORMAL LOW (ref 13.0–17.0)
MCH: 28.4 pg (ref 26.0–34.0)
MCHC: 33.3 g/dL (ref 30.0–36.0)
MCV: 85.3 fL (ref 80.0–100.0)
Platelets: 147 10*3/uL — ABNORMAL LOW (ref 150–400)
RBC: 3.13 MIL/uL — ABNORMAL LOW (ref 4.22–5.81)
RDW: 13.2 % (ref 11.5–15.5)
WBC: 13.2 10*3/uL — ABNORMAL HIGH (ref 4.0–10.5)
nRBC: 0 % (ref 0.0–0.2)

## 2020-04-09 LAB — PROCALCITONIN: Procalcitonin: 0.9 ng/mL

## 2020-04-09 LAB — VANCOMYCIN, RANDOM: Vancomycin Rm: 36

## 2020-04-09 LAB — 25-HYDROXY VITAMIN D LCMS D2+D3
25-Hydroxy, Vitamin D-2: <1 ng/mL
25-Hydroxy, Vitamin D-3: 8.6 ng/mL
25-Hydroxy, Vitamin D: 8.6 ng/mL — ABNORMAL LOW

## 2020-04-09 LAB — HAPTOGLOBIN: Haptoglobin: 381 mg/dL — ABNORMAL HIGH (ref 23–355)

## 2020-04-09 MED ORDER — BISACODYL 5 MG PO TBEC: 10.0000 mg | DELAYED_RELEASE_TABLET | Freq: Every day | ORAL | Status: DC | PRN

## 2020-04-09 MED ORDER — DOCUSATE SODIUM 100 MG PO CAPS
200.0000 mg | ORAL_CAPSULE | Freq: Two times a day (BID) | ORAL | Status: DC
  Administered 2020-04-09 – 2020-04-14 (×11): 200 mg via ORAL
  Filled 2020-04-09 (×10): qty 2

## 2020-04-09 NOTE — Progress Notes (Signed)
Following patient for outpatient hemodialysis placement. Patient and family choose [facility], referral was sent 8/10. Still waiting on clinic acceptance. Clinic requested a copy to COVID vaccine card. Father is aware and stated he would have his sister being it tomorrow. Please contact me with any dialysis placement concerns.  Elvera Bicker Dialysis Coordinator [phone]

## 2020-04-09 NOTE — Progress Notes (Signed)
Pharmacy Antibiotic Note  Joseph Gilmore is a 49 y.o. male admitted on 04/03/2020 with N/V and SOB w/ h/o autism and OCD, presents w/ elevate SCr of 15.20 without a previous baseline, also notable hypocalcemia and diffuse bladder wall thickening s/t bladder outlet obstruction s/t enlarged prostate gland w/ PSA of 26.65. Patient is s/p perm-cath placement for HD.   Tmax 102.1F > , tachypneic, w/ increase in WBC 10.8 >> 13.9 > 13.2, placed on ceftriaxone 1g for possible enteritis, possible urinary source. Pt has blisters on forearm.  Pharmacy has been consulted for vancomycin/ceftriaxone dosing. ID following.   Plan: Day 3 of abx. Pt was receiving vancomycin 1000 mg after every HD session. Vancomycin random returned 36. No HD scheduled for today. Will need to order another random level prior to the next HD session.   Ceftriaxone 2 g daily.   Goal random < 20 mcg/mL  Height: 5\' 8"  (172.7 cm) Weight: 100.4 kg (221 lb 4.8 oz) IBW/kg (Calculated) : 68.4  Temp (24hrs), Avg:98.5 F (36.9 C), Min:98.2 F (36.8 C), Max:98.9 F (37.2 C)  Recent Labs  Lab 04/05/20 0825 04/05/20 0825 04/06/20 0527 04/06/20 2055 04/07/20 0413 04/07/20 0717 04/08/20 0634 04/09/20 0546  WBC 9.6   < > 10.8* 13.9* 12.8*  --  13.0* 13.2*  CREATININE 14.11*  --  13.38*  --  10.59*  --  8.29* 7.05*  LATICACIDVEN  --   --   --   --  0.8 0.8  --   --   VANCORANDOM  --   --   --   --   --   --   --  36   < > = values in this interval not displayed.    Estimated Creatinine Clearance: 14.7 mL/min (A) (by C-G formula based on SCr of 7.05 mg/dL (H)).    Allergies  Allergen Reactions  . Chlorhexidine     Thank you for allowing pharmacy to be a part of this patient's care.  Eleonore Chiquito, PharmD, BCPS Clinical Pharmacist 04/09/2020 11:50 AM

## 2020-04-09 NOTE — TOC Initial Note (Signed)
Transition of Care [facility]) - Initial/Assessment Note    Patient Details  Name: Joseph Gilmore MRN: [id number] Date of Birth: 1970/10/20  Transition of Care [facility]) CM/SW Contact:    Victorino Dike, RN Phone Number: 04/09/2020, 3:03 PM  Clinical Narrative:                 Spoke with patient's father Joseph Gilmore, he is caregiver and Media planner.  They have declined recommendation for SNF therapy after discharge.   Home Health: RN, PT, and OT set up with [facility] after discharge.  PCP reported as Dr Tamala Bari.     Expected Discharge Plan: [place] Barriers to Discharge: Continued Medical Work up   Patient Goals and CMS Choice Patient states their goals for this hospitalization and ongoing recovery are:: To return and continue therapy CMS Medicare.gov Compare Post Acute Care list provided to:: Patient Represenative (must comment) Choice offered to / list presented to : Parent  Expected Discharge Plan and Services Expected Discharge Plan: [place]   Discharge Planning Services: CM Consult   Living arrangements for the past 2 months: Single Family Home                           HH Arranged: RN, PT, OT [facility] Agency: [place] ([place]) Date HH Agency Contacted: 04/09/20 Time HH Agency Contacted: 50 Representative spoke with at [place]: Corene Cornea  Prior Living Arrangements/Services Living arrangements for the past 2 months: [place] Lives with:: Parents Patient language and need for interpreter reviewed:: No Do you feel safe going back to the place where you live?: Yes      Need for Family Participation in Patient Care: Yes (Comment) Care giver support system in place?: Yes (comment)   Criminal Activity/Legal Involvement Pertinent to Current Situation/Hospitalization: No - Comment as needed  Activities of Daily Living Home Assistive Devices/Equipment: None ADL Screening (condition at time of  admission) Patient's cognitive ability adequate to safely complete daily activities?: Yes Is the patient deaf or have difficulty hearing?: No Does the patient have difficulty seeing, even when wearing glasses/contacts?: No Does the patient have difficulty concentrating, remembering, or making decisions?: No Patient able to express need for assistance with ADLs?: Yes Does the patient have difficulty dressing or bathing?: No Independently performs ADLs?: Yes (appropriate for developmental age) Does the patient have difficulty walking or climbing stairs?: No Weakness of Legs: Both Weakness of Arms/Hands: None  Permission Sought/Granted                  Emotional Assessment         Alcohol / Substance Use: Not Applicable Psych Involvement: No (comment)  Admission diagnosis:  Hypocalcemia [E83.51] Enteritis [K52.9] Prolonged Q-T interval on ECG [R94.31] AKI (acute kidney injury) ([place]) [N17.9] Acute kidney injury ([place]) [N17.9] Patient Active Problem List   Diagnosis Date Noted  . Non-traumatic rhabdomyolysis   . Weakness   . Fever   . Enteritis   . Autism 04/03/2020  . AKI (acute kidney injury) ([place]) 04/03/2020  . Metabolic acidosis 25/95/6387  . Hypocalcemia 04/03/2020  . Anemia of chronic disease 04/03/2020  . Elevated LFTs 04/03/2020  . Elevated lipase 04/03/2020  . Essential hypertension 04/03/2020  . Hypertensive urgency 04/03/2020  . Vomiting 04/03/2020  . BPH (benign prostatic hyperplasia) 04/03/2020   PCP:  Lorelee Market, MD Pharmacy:   [facility], [place]  [place] [address] [place] [address] Phone: [phone] Fax: [phone]     Social Determinants of Health (SDOH) Interventions    Readmission Risk Interventions No flowsheet data found.

## 2020-04-09 NOTE — Progress Notes (Signed)
[facility]  ROUNDING NOTE   Subjective:   Third hemodialysis treatment yesterday. Pateint was seated in chair. Tolerated treatment well. UF 1 liter  Today patient has no complaints. Father at bedside.   Objective:  Vital signs in last 24 hours:  Temp:  [98.2 F (36.8 C)-98.9 F (37.2 C)] 98.7 F (37.1 C) (08/12 0807) Pulse Rate:  [69-91] 82 (08/12 0807) Resp:  [17-20] 17 (08/12 0807) BP: (94-163)/(55-98) 163/98 (08/12 0807) SpO2:  [93 %-100 %] 100 % (08/12 0807)  Weight change:  Filed Weights   04/06/20 1953 04/07/20 0455 04/08/20 0441  Weight: 113.9 kg 114.7 kg 100.4 kg    Intake/Output: I/O last 3 completed shifts: In: 1380 [P.O.:1080; IV Piggyback:300] Out: 1800 [Urine:800; Other:1000]   Intake/Output this shift:  No intake/output data recorded.  Physical Exam: General: NAD, laying in bed  Head: Normocephalic, atraumatic. Moist oral mucosal membranes  Eyes: Anicteric, PERRL  Neck: Supple, trachea midline  Lungs:  Clear to auscultation  Heart: Regular rate and rhythm  Abdomen:  Soft, nontender,   Extremities:  no peripheral edema.  Neurologic: +learning disability, unable to give history  Skin: No lesions  Access RIJ permcath 8/9 Dr. Lucky Cowboy  GU Foley catheter with urine clear    Basic Metabolic Panel: Recent Labs  Lab 04/03/20 0721 04/03/20 0721 04/03/20 1809 04/04/20 6644 04/04/20 0347 04/05/20 0825 04/05/20 0825 04/06/20 0527 04/06/20 0527 04/07/20 0413 04/08/20 0634 04/09/20 0546  NA 140   < >  --  141   < > 139  --  142  --  139 140 138  K 4.0   < >  --  3.6   < > 3.2*  --  3.7  --  4.0 4.3 3.7  CL 111   < >  --  107   < > 102  --  101  --  99 98 97*  CO2 14*   < >  --  16*   < > 21*  --  26  --  25 28 27   GLUCOSE 108*   < >  --  102*   < > 102*  --  106*  --  100* 104* 89  BUN 74*   < >  --  72*   < > 69*  --  69*  --  50* 46* 36*  CREATININE 15.07*   < >  --  14.15*   < > 14.11*  --  13.38*  --  10.59* 8.29* 7.05*  CALCIUM  4.4*   < >  --  4.6*   < > 4.8*   < > 4.9*   < > 5.6* 6.5* 6.6*  MG 1.5*  --   --  1.5*  --   --   --  1.5*  --  1.6* 2.0  --   PHOS  --   --  6.3*  --   --  7.0*  --   --   --   --  5.3*  --    < > = values in this interval not displayed.    Liver Function Tests: Recent Labs  Lab 04/03/20 0248 04/03/20 0248 04/04/20 0916 04/05/20 0825 04/06/20 0527 04/07/20 0413 04/08/20 0634  AST 48*  --  22  --  22 31 32  ALT 66*  --  36  --  28 29 26   ALKPHOS 62  --  51  --  48 44 55  BILITOT 0.8  --  0.8  --  0.7 0.9 0.8  PROT 7.6  --  6.4*  --  6.2* 6.2* 7.0  ALBUMIN 3.9   < > 3.4* 3.1* 3.3* 3.1* 3.3*   < > = values in this interval not displayed.   Recent Labs  Lab 04/02/20 2248 04/04/20 1357  LIPASE 90* 71*   No results for input(s): AMMONIA in the last 168 hours.  CBC: Recent Labs  Lab 04/04/20 0916 04/04/20 0916 04/05/20 0825 04/05/20 0825 04/06/20 0527 04/06/20 2055 04/07/20 0413 04/08/20 0634 04/09/20 0546  WBC 10.6*   < > 9.6   < > 10.8* 13.9* 12.8* 13.0* 13.2*  NEUTROABS 8.2*  --  6.8  --  7.4  --  9.6*  --   --   HGB 9.3*   < > 8.7*   < > 8.4* 9.6* 8.9* 10.0* 8.9*  HCT 27.5*   < > 25.7*   < > 26.1* 29.4* 26.7* 32.2* 26.7*  MCV 81.8   < > 82.6   < > 86.1 85.2 85.3 88.2 85.3  PLT 163   < > 162   < > 171 178 168 161 147*   < > = values in this interval not displayed.    Cardiac Enzymes: Recent Labs  Lab 04/03/20 0721 04/04/20 1357 04/06/20 0730 04/08/20 0634  CKTOTAL 2,732* 2,334* 2,023* 1,168*    BNP: Invalid input(s): POCBNP  CBG: No results for input(s): GLUCAP in the last 168 hours.  Microbiology: Results for orders placed or performed during the hospital encounter of 04/03/20  SARS Coronavirus 2 by RT PCR (hospital order, performed in [facility] hospital lab) Nasopharyngeal Nasopharyngeal Swab     Status: None   Collection Time: 04/03/20  3:06 AM   Specimen: Nasopharyngeal Swab  Result Value Ref Range Status   SARS Coronavirus 2 NEGATIVE  NEGATIVE Final    Comment: (NOTE) SARS-CoV-2 target nucleic acids are NOT DETECTED.  The SARS-CoV-2 RNA is generally detectable in upper and lower respiratory specimens during the acute phase of infection. The lowest concentration of SARS-CoV-2 viral copies this assay can detect is 250 copies / mL. A negative result does not preclude SARS-CoV-2 infection and should not be used as the sole basis for treatment or other patient management decisions.  A negative result may occur with improper specimen collection / handling, submission of specimen other than nasopharyngeal swab, presence of viral mutation(s) within the areas targeted by this assay, and inadequate number of viral copies (<250 copies / mL). A negative result must be combined with clinical observations, patient history, and epidemiological information.  Fact Sheet for Patients:   StrictlyIdeas.no  Fact Sheet for Healthcare Providers: BankingDealers.co.za  This test is not yet approved or  cleared by the [place] FDA and has been authorized for detection and/or diagnosis of SARS-CoV-2 by FDA under an Emergency Use Authorization (EUA).  This EUA will remain in effect (meaning this test can be used) for the duration of the COVID-19 declaration under Section 564(b)(1) of the Act, 21 U.S.C. section 360bbb-3(b)(1), unless the authorization is terminated or revoked sooner.  Performed at [facility], [address] [place], [place] [address]   Culture, blood (Routine X 2) w Reflex to ID Panel     Status: None (Preliminary result)   Collection Time: 04/05/20  5:08 PM   Specimen: BLOOD  Result Value Ref Range Status   Specimen Description BLOOD LAC  Final   Special Requests   Final    BOTTLES DRAWN AEROBIC AND ANAEROBIC Blood  Culture adequate volume   Culture   Final    NO GROWTH 4 DAYS Performed at [facility], [address] [place], [place]  [address]    Report Status PENDING  Incomplete  CULTURE, BLOOD (ROUTINE X 2) w Reflex to ID Panel     Status: None (Preliminary result)   Collection Time: 04/06/20  7:30 AM   Specimen: BLOOD  Result Value Ref Range Status   Specimen Description BLOOD LEFT ARM  Final   Special Requests   Final    BOTTLES DRAWN AEROBIC AND ANAEROBIC Blood Culture results may not be optimal due to an excessive volume of blood received in culture bottles   Culture   Final    NO GROWTH 3 DAYS Performed at [facility], [address] [place], [place] [address]    Report Status PENDING  Incomplete  CULTURE, BLOOD (ROUTINE X 2) w Reflex to ID Panel     Status: None (Preliminary result)   Collection Time: 04/06/20  8:28 AM   Specimen: BLOOD  Result Value Ref Range Status   Specimen Description BLOOD BLOOD RIGHT HAND  Final   Special Requests   Final    BOTTLES DRAWN AEROBIC AND ANAEROBIC Blood Culture adequate volume   Culture   Final    NO GROWTH 3 DAYS Performed at [facility], [address] [place], [place] [address]    Report Status PENDING  Incomplete  Urine Culture     Status: Abnormal   Collection Time: 04/07/20  6:02 AM   Specimen: Urine, Random  Result Value Ref Range Status   Specimen Description   Final    URINE, RANDOM Performed at [facility], [address], [place], [place] [address]    Special Requests   Final    NONE Performed at [facility], [address] [place], [place] [address]    Culture MULTIPLE SPECIES PRESENT, SUGGEST RECOLLECTION (A)  Final   Report Status 04/08/2020 FINAL  Final    Coagulation Studies: No results for input(s): LABPROT, INR in the last 72 hours.  Urinalysis: No results for input(s): COLORURINE, LABSPEC, PHURINE, GLUCOSEU, HGBUR, BILIRUBINUR, KETONESUR, PROTEINUR, UROBILINOGEN, NITRITE, LEUKOCYTESUR in the last 72 hours.  Invalid input(s): APPERANCEUR    Imaging: US Venous Img Lower Bilateral  (DVT)  Result Date: 04/08/2020 CLINICAL DATA:  49 year old male with a history fever EXAM: BILATERAL LOWER EXTREMITY VENOUS DOPPLER ULTRASOUND TECHNIQUE: Gray-scale sonography with graded compression, as well as color Doppler and duplex ultrasound were performed to evaluate the lower extremity deep venous systems from the level of the common femoral vein and including the common femoral, femoral, profunda femoral, popliteal and calf veins including the posterior tibial, peroneal and gastrocnemius veins when visible. The superficial great saphenous vein was also interrogated. Spectral Doppler was utilized to evaluate flow at rest and with distal augmentation maneuvers in the common femoral, femoral and popliteal veins. COMPARISON:  None. FINDINGS: RIGHT LOWER EXTREMITY Common Femoral Vein: No evidence of thrombus. Normal compressibility, respiratory phasicity and response to augmentation. Saphenofemoral Junction: No evidence of thrombus. Normal compressibility and flow on color Doppler imaging. Profunda Femoral Vein: No evidence of thrombus. Normal compressibility and flow on color Doppler imaging. Femoral Vein: No evidence of thrombus. Normal compressibility, respiratory phasicity and response to augmentation. Popliteal Vein: No evidence of thrombus. Normal compressibility, respiratory phasicity and response to augmentation. Calf Veins: No evidence of thrombus. Normal compressibility and flow on color Doppler imaging. Superficial Great Saphenous Vein: No evidence  of thrombus. Normal compressibility and flow on color Doppler imaging. Other Findings:  None. LEFT LOWER EXTREMITY Common Femoral Vein: No evidence of thrombus. Normal compressibility, respiratory phasicity and response to augmentation. Saphenofemoral Junction: No evidence of thrombus. Normal compressibility and flow on color Doppler imaging. Profunda Femoral Vein: No evidence of thrombus. Normal compressibility and flow on color Doppler imaging. Femoral  Vein: No evidence of thrombus. Normal compressibility, respiratory phasicity and response to augmentation. Popliteal Vein: No evidence of thrombus. Normal compressibility, respiratory phasicity and response to augmentation. Calf Veins: No evidence of thrombus. Normal compressibility and flow on color Doppler imaging. Superficial Great Saphenous Vein: No evidence of thrombus. Normal compressibility and flow on color Doppler imaging. Other Findings:  None. IMPRESSION: Sonographic survey of the bilateral lower extremities negative for DVT Electronically Signed   By: Corrie Mckusick D.O.   On: 04/08/2020 10:04   CT MAXILLOFACIAL WO CONTRAST  Result Date: 04/08/2020 CLINICAL DATA:  Rule out tooth abscess. Tooth pain. Dialysis patient. EXAM: CT MAXILLOFACIAL WITHOUT CONTRAST TECHNIQUE: Multidetector CT imaging of the maxillofacial structures was performed. Multiplanar CT image reconstructions were also generated. COMPARISON:  None. FINDINGS: Osseous: Negative for fracture. Periapical lucency around right lower molar compatible with dental infection. No adjacent soft tissue swelling. No other dental lesion. Orbits: Normal orbit.  Negative for soft tissue mass or edema. Sinuses: Paranasal sinuses clear.  Mastoid clear bilaterally. Soft tissues: No soft tissue swelling or soft tissue abscess adjacent to the right lower molar periapical lucency. Dialysis central venous catheter from a right jugular approach. Tip not visualized. No hematoma or mass in the neck. Limited intracranial: Negative for acute abnormality. Chronic infarct left basal ganglia. IMPRESSION: Periapical lucency around right lower molar compatible with dental infection. No soft tissue edema or abscess identified. Electronically Signed   By: Franchot Gallo M.D.   On: 04/08/2020 14:09     Medications:   . sodium chloride Stopped (04/07/20 0320)  . cefTRIAXone (ROCEPHIN)  IV 2 g (04/09/20 0008)   . amLODipine  10 mg Oral Daily  . calcium acetate   1,334 mg Oral TID WC  . docusate sodium  200 mg Oral BID  . dutasteride  0.5 mg Oral Daily  . heparin  5,000 Units Subcutaneous Q8H  . hydrALAZINE  10 mg Oral Q8H  . magnesium oxide  400 mg Oral Daily  . metoprolol tartrate  100 mg Oral BID  . multivitamin  1 tablet Oral QHS  . sodium chloride flush  10 mL Intravenous Q12H  . tamsulosin  0.4 mg Oral Daily  . vancomycin variable dose per unstable renal function (pharmacist dosing)   Does not apply See admin instructions   sodium chloride, acetaminophen **OR** acetaminophen, bisacodyl, hydrALAZINE, HYDROmorphone (DILAUDID) injection, LORazepam, ondansetron (ZOFRAN) IV  Assessment/ Plan:  Mr. ZACHARI ALBERTA is a 49 y.o. black male with learning disability, autism, hypertension, obsessive compulsive disorder who is admitted to [facility] on 04/03/2020 for Hypocalcemia [E83.51] Enteritis [K52.9] Prolonged Q-T interval on ECG [R94.31] AKI (acute kidney injury) ([place]) [N17.9] Acute kidney injury ([place]) [N17.9]  1.  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  Pending ANCA.  No indication for dialysis today.   2. Secondary Hyperparathyroidism with Hypocalcemia Required calcium gluconate Phosphorus at goal.  - Continue PO calcium acetate with meals.   3. Hypertension: emergency on admission.  Current regimen of metoprolol, hydralazine,  amlodipine and tamsulosin. Holding ACE-I/ARB and diuretics due to renal failure at this time. - start losartan    4. Anemia with renal failure and iron deficiency: folic acid level is also low.  - scheduled EPO with dialysis treatment - Continue renal vitamin.    5. Obstructive uropathy: foley catheter placed - started on tamsulosin and dutasteride this admission - Appreciate urology input.     LOS: 6 Andreus Cure 8/12/202110:27  AM

## 2020-04-09 NOTE — Consult Note (Signed)
WOC Nurse Consult Note: Reason for Consult:Extravasation injury to right dorsal hand from sodium bicarbonate.  Intact serum filled blisters  Wound type:inflammatory Pressure Injury POA: NA Measurement: scattered 0.5 cm blistering to dorsal hand and wrist Wound HFW:YOVZ and moist Drainage (amount, consistency, odor) none Periwound:intact Dressing procedure/placement/frequency:cleanse blisters to right hand with soap and water.  Apply vaseline gauze and dry gauze with kerlix/tape to hand. Change daily. Will not follow at this time.  Please re-consult if needed.  Domenic Moras MSN, RN, FNP-BC CWON Wound, Ostomy, Continence Nurse Pager [phone]

## 2020-04-09 NOTE — Progress Notes (Signed)
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  S/p permcath placed. Continue on HD as per nephro   Fever: etiology unclear, possible tooth infection as recent of tooth pain & suppose to have dental work done but was canceled for some reason as per pt's father. afebrile 24 hours. Continue on IV rocephin & vancomycin. Blood cxs NGTD. CXR neg.  CT abd/pelvis scan the other day showed possible enteritis. ID following and recs apprec  Right lower molar infection: as pt CT scan. Possible etiology of fevers but no fever in 24 hours. Continue on IV rocephin & vanco as per ID   BPH: continue w/ foley. Continue on flomax and proscar. W/ elevated PSA, urology will f/u as an outpatient  ACD: H&H are labile. No need for a transfusion at this time. Will continue to monitor   Thrombocytopenia: etiology unclear. Will continue to monitor  Leukocytosis: likely secondary to infection. Continue on IV abxs    HTN: continue on amlodipine and metoprolol  Weakness: PT recs SNF   Autism: continue w/ supportive care     DVT prophylaxis: heparin Code Status: full Family Communication: discussed pt's care w/ pt's father, Angelgabriel, again today and answered his questions Disposition Plan: likely d/c home w/ home health as pt's father refused SNF. Will place home health orders  Status is: Inpatient  Remains inpatient appropriate because:Unsafe d/c plan & still on IV abxs as per ID.  Waiting an outpatient HD spot   Dispo: The patient is from: Home               Anticipated d/c is to: Home              Anticipated d/c date is: 2 days              Patient currently is not medically stable to d/c.    Consultants:   nephro   Vascular surg  ID   Procedures:   Antimicrobials: rocephin, vancomycin    Subjective: Pt c/o malaise  Objective: Vitals:   04/08/20 1800 04/08/20 1824 04/08/20 1830 04/08/20 1929  BP: (!) 116/91 (!) 106/92 (!) 146/81 (!) 153/75  Pulse: 80 82 87 91  Resp:    20  Temp:   98.4 F (36.9 C) 98.9 F (37.2 C)  TempSrc:   Oral Oral  SpO2:    100%  Weight:      Height:        Intake/Output Summary (Last 24 hours) at 04/09/2020 0717 Last data filed at 04/09/2020 0600 Gross per 24 hour  Intake 1380 ml  Output 1300 ml  Net 80 ml   Filed Weights   04/06/20 1953 04/07/20 0455 04/08/20 0441  Weight: 113.9 kg 114.7 kg 100.4 kg    Examination:  General exam: Appears calm and comfortable  Respiratory system: clear breath sounds b/l. No rales Cardiovascular system: S1 & S2+. No rubs, gallops or clicks. Gastrointestinal system: Abdomen is obese, soft and nontender. Hypoactive bowel sounds heard. Central nervous system: Alert and oriented. Moves  all 4 extremities Psychiatry: Judgement and insight appear abnormal. Mood & affect appropriate.      Data Reviewed: I have personally reviewed following labs and imaging studies  CBC: Recent Labs  Lab 04/04/20 0916 04/04/20 0916 04/05/20 0825 04/05/20 0825 04/06/20 0527 04/06/20 2055 04/07/20 0413 04/08/20 0634 04/09/20 0546  WBC 10.6*   < > 9.6   < > 10.8* 13.9* 12.8* 13.0* 13.2*  NEUTROABS 8.2*  --  6.8  --  7.4  --  9.6*  --   --   HGB 9.3*   < > 8.7*   < > 8.4* 9.6* 8.9* 10.0* 8.9*  HCT 27.5*   < > 25.7*   < > 26.1* 29.4* 26.7* 32.2* 26.7*  MCV 81.8   < > 82.6   < > 86.1 85.2 85.3 88.2 85.3  PLT 163   < > 162   < > 171 178 168 161 147*   < > = values in this interval not displayed.   Basic Metabolic Panel: Recent Labs  Lab 04/03/20 0721  04/03/20 0721 04/03/20 1809 04/04/20 0916 04/05/20 0825 04/06/20 0527 04/07/20 0413 04/08/20 0634  NA 140   < >  --  141 139 142 139 140  K 4.0   < >  --  3.6 3.2* 3.7 4.0 4.3  CL 111   < >  --  107 102 101 99 98  CO2 14*   < >  --  16* 21* 26 25 28   GLUCOSE 108*   < >  --  102* 102* 106* 100* 104*  BUN 74*   < >  --  72* 69* 69* 50* 46*  CREATININE 15.07*   < >  --  14.15* 14.11* 13.38* 10.59* 8.29*  CALCIUM 4.4*   < >  --  4.6* 4.8* 4.9* 5.6* 6.5*  MG 1.5*  --   --  1.5*  --  1.5* 1.6* 2.0  PHOS  --   --  6.3*  --  7.0*  --   --  5.3*   < > = values in this interval not displayed.   GFR: Estimated Creatinine Clearance: 12.5 mL/min (A) (by C-G formula based on SCr of 8.29 mg/dL (H)). Liver Function Tests: Recent Labs  Lab 04/03/20 0248 04/03/20 0248 04/04/20 0916 04/05/20 0825 04/06/20 0527 04/07/20 0413 04/08/20 0634  AST 48*  --  22  --  22 31 32  ALT 66*  --  36  --  28 29 26   ALKPHOS 62  --  51  --  48 44 55  BILITOT 0.8  --  0.8  --  0.7 0.9 0.8  PROT 7.6  --  6.4*  --  6.2* 6.2* 7.0  ALBUMIN 3.9   < > 3.4* 3.1* 3.3* 3.1* 3.3*   < > = values in this interval not displayed.   Recent Labs  Lab 04/02/20 2248 04/04/20 1357  LIPASE 90* 71*   No results for input(s): AMMONIA in the last 168 hours. Coagulation Profile: Recent Labs  Lab 04/06/20 0527  INR 1.2   Cardiac Enzymes: Recent Labs  Lab 04/03/20 0721 04/04/20 1357 04/06/20 0730 04/08/20 0634  CKTOTAL 2,732* 2,334* 2,023* 1,168*   BNP (last 3 results) No results for input(s): PROBNP in the last 8760 hours. HbA1C: No results for input(s): HGBA1C in the last 72 hours. CBG: No results for input(s): GLUCAP in the last 168 hours. Lipid Profile: No results for input(s): CHOL, HDL, LDLCALC, TRIG, CHOLHDL, LDLDIRECT  in the last 72 hours. Thyroid Function Tests: No results for input(s): TSH, T4TOTAL, FREET4, T3FREE, THYROIDAB in the last 72 hours. Anemia Panel: No results for input(s): VITAMINB12,  FOLATE, FERRITIN, TIBC, IRON, RETICCTPCT in the last 72 hours. Sepsis Labs: Recent Labs  Lab 04/07/20 0413 04/07/20 0717 04/08/20 0634  PROCALCITON 0.77  --  0.98  LATICACIDVEN 0.8 0.8  --     Recent Results (from the past 240 hour(s))  SARS Coronavirus 2 by RT PCR (hospital order, performed in [facility] hospital lab) Nasopharyngeal Nasopharyngeal Swab     Status: None   Collection Time: 04/03/20  3:06 AM   Specimen: Nasopharyngeal Swab  Result Value Ref Range Status   SARS Coronavirus 2 NEGATIVE NEGATIVE Final    Comment: (NOTE) SARS-CoV-2 target nucleic acids are NOT DETECTED.  The SARS-CoV-2 RNA is generally detectable in upper and lower respiratory specimens during the acute phase of infection. The lowest concentration of SARS-CoV-2 viral copies this assay can detect is 250 copies / mL. A negative result does not preclude SARS-CoV-2 infection and should not be used as the sole basis for treatment or other patient management decisions.  A negative result may occur with improper specimen collection / handling, submission of specimen other than nasopharyngeal swab, presence of viral mutation(s) within the areas targeted by this assay, and inadequate number of viral copies (<250 copies / mL). A negative result must be combined with clinical observations, patient history, and epidemiological information.  Fact Sheet for Patients:   StrictlyIdeas.no  Fact Sheet for Healthcare Providers: BankingDealers.co.za  This test is not yet approved or  cleared by the [place] FDA and has been authorized for detection and/or diagnosis of SARS-CoV-2 by FDA under an Emergency Use Authorization (EUA).  This EUA will remain in effect (meaning this test can be used) for the duration of the COVID-19 declaration under Section 564(b)(1) of the Act, 21 U.S.C. section 360bbb-3(b)(1), unless the authorization is terminated or revoked  sooner.  Performed at [facility], [address] [place], [place] [address]   Culture, blood (Routine X 2) w Reflex to ID Panel     Status: None (Preliminary result)   Collection Time: 04/05/20  5:08 PM   Specimen: BLOOD  Result Value Ref Range Status   Specimen Description BLOOD LAC  Final   Special Requests   Final    BOTTLES DRAWN AEROBIC AND ANAEROBIC Blood Culture adequate volume   Culture   Final    NO GROWTH 4 DAYS Performed at [facility], [address] [place], [place] [address]    Report Status PENDING  Incomplete  CULTURE, BLOOD (ROUTINE X 2) w Reflex to ID Panel     Status: None (Preliminary result)   Collection Time: 04/06/20  7:30 AM   Specimen: BLOOD  Result Value Ref Range Status   Specimen Description BLOOD LEFT ARM  Final   Special Requests   Final    BOTTLES DRAWN AEROBIC AND ANAEROBIC Blood Culture results may not be optimal due to an excessive volume of blood received in culture bottles   Culture   Final    NO GROWTH 3 DAYS Performed at [facility], [address] [place], [place] [address]    Report Status PENDING  Incomplete  CULTURE, BLOOD (ROUTINE X 2) w Reflex to ID Panel     Status: None (Preliminary result)   Collection Time: 04/06/20  8:28 AM   Specimen: BLOOD  Result Value Ref Range Status  Specimen Description BLOOD BLOOD RIGHT HAND  Final   Special Requests   Final    BOTTLES DRAWN AEROBIC AND ANAEROBIC Blood Culture adequate volume   Culture   Final    NO GROWTH 3 DAYS Performed at [facility], [address] [place], [place] [address]    Report Status PENDING  Incomplete  Urine Culture     Status: Abnormal   Collection Time: 04/07/20  6:02 AM   Specimen: Urine, Random  Result Value Ref Range Status   Specimen Description   Final    URINE, RANDOM Performed at [facility], [address], [place], [place] [address]    Special Requests   Final    NONE Performed at [facility], [address] [place], [place] [address]    Culture MULTIPLE SPECIES PRESENT, SUGGEST RECOLLECTION (A)  Final   Report Status 04/08/2020 FINAL  Final         Radiology Studies: US Venous Img Lower Bilateral (DVT)  Result Date: 04/08/2020 CLINICAL DATA:  49 year old male with a history fever EXAM: BILATERAL LOWER EXTREMITY VENOUS DOPPLER ULTRASOUND TECHNIQUE: Gray-scale sonography with graded compression, as well as color Doppler and duplex ultrasound were performed to evaluate the lower extremity deep venous systems from the level of the common femoral vein and including the common femoral, femoral, profunda femoral, popliteal and calf veins including the posterior tibial, peroneal and gastrocnemius veins when visible. The superficial great saphenous vein was also interrogated. Spectral Doppler was utilized to evaluate flow at rest and with distal augmentation maneuvers in the common femoral, femoral and popliteal veins. COMPARISON:  None. FINDINGS: RIGHT LOWER EXTREMITY Common Femoral Vein: No evidence of thrombus. Normal compressibility, respiratory phasicity and response to augmentation. Saphenofemoral Junction: No evidence of thrombus. Normal compressibility and flow on color Doppler imaging. Profunda Femoral Vein: No evidence of thrombus. Normal compressibility and flow on color Doppler imaging. Femoral Vein: No evidence of thrombus. Normal compressibility, respiratory phasicity and response to augmentation. Popliteal Vein: No evidence of thrombus. Normal compressibility, respiratory phasicity and response to augmentation. Calf Veins: No evidence of thrombus. Normal compressibility and flow on color Doppler imaging. Superficial Great Saphenous Vein: No evidence of thrombus. Normal compressibility and flow on color Doppler imaging. Other Findings:  None. LEFT LOWER EXTREMITY Common Femoral Vein: No evidence of thrombus. Normal compressibility, respiratory phasicity and response to  augmentation. Saphenofemoral Junction: No evidence of thrombus. Normal compressibility and flow on color Doppler imaging. Profunda Femoral Vein: No evidence of thrombus. Normal compressibility and flow on color Doppler imaging. Femoral Vein: No evidence of thrombus. Normal compressibility, respiratory phasicity and response to augmentation. Popliteal Vein: No evidence of thrombus. Normal compressibility, respiratory phasicity and response to augmentation. Calf Veins: No evidence of thrombus. Normal compressibility and flow on color Doppler imaging. Superficial Great Saphenous Vein: No evidence of thrombus. Normal compressibility and flow on color Doppler imaging. Other Findings:  None. IMPRESSION: Sonographic survey of the bilateral lower extremities negative for DVT Electronically Signed   By: Corrie Mckusick D.O.   On: 04/08/2020 10:04   CT MAXILLOFACIAL WO CONTRAST  Result Date: 04/08/2020 CLINICAL DATA:  Rule out tooth abscess. Tooth pain. Dialysis patient. EXAM: CT MAXILLOFACIAL WITHOUT CONTRAST TECHNIQUE: Multidetector CT imaging of the maxillofacial structures was performed. Multiplanar CT image reconstructions were also generated. COMPARISON:  None. FINDINGS: Osseous: Negative for fracture. Periapical lucency around right lower molar compatible with dental infection. No adjacent soft tissue swelling. No other dental lesion. Orbits: Normal orbit.  Negative  for soft tissue mass or edema. Sinuses: Paranasal sinuses clear.  Mastoid clear bilaterally. Soft tissues: No soft tissue swelling or soft tissue abscess adjacent to the right lower molar periapical lucency. Dialysis central venous catheter from a right jugular approach. Tip not visualized. No hematoma or mass in the neck. Limited intracranial: Negative for acute abnormality. Chronic infarct left basal ganglia. IMPRESSION: Periapical lucency around right lower molar compatible with dental infection. No soft tissue edema or abscess identified.  Electronically Signed   By: Franchot Gallo M.D.   On: 04/08/2020 14:09        Scheduled Meds: . amLODipine  10 mg Oral Daily  . calcium acetate  1,334 mg Oral TID WC  . dutasteride  0.5 mg Oral Daily  . heparin  5,000 Units Subcutaneous Q8H  . hydrALAZINE  10 mg Oral Q8H  . magnesium oxide  400 mg Oral Daily  . metoprolol tartrate  100 mg Oral BID  . multivitamin  1 tablet Oral QHS  . sodium chloride flush  10 mL Intravenous Q12H  . tamsulosin  0.4 mg Oral Daily  . vancomycin variable dose per unstable renal function (pharmacist dosing)   Does not apply See admin instructions   Continuous Infusions: . sodium chloride Stopped (04/07/20 0320)  . cefTRIAXone (ROCEPHIN)  IV 2 g (04/09/20 0008)     LOS: 6 days    Time spent: 35 mins    Wyvonnia Dusky, MD [facility] Pager [phone]  If 7PM-7AM, please contact night-coverage [email or web address] 04/09/2020, 7:17 AM

## 2020-04-10 DIAGNOSIS — D696 Thrombocytopenia, unspecified: Secondary | ICD-10-CM | POA: Diagnosis not present

## 2020-04-10 DIAGNOSIS — N138 Other obstructive and reflux uropathy: Secondary | ICD-10-CM | POA: Diagnosis not present

## 2020-04-10 DIAGNOSIS — N186 End stage renal disease: Secondary | ICD-10-CM | POA: Diagnosis not present

## 2020-04-10 DIAGNOSIS — N401 Enlarged prostate with lower urinary tract symptoms: Secondary | ICD-10-CM | POA: Diagnosis not present

## 2020-04-10 LAB — BASIC METABOLIC PANEL WITH GFR
Anion gap: 13 (ref 5–15)
BUN: 47 mg/dL — ABNORMAL HIGH (ref 6–20)
CO2: 27 mmol/L (ref 22–32)
Calcium: 6.2 mg/dL — CL (ref 8.9–10.3)
Chloride: 101 mmol/L (ref 98–111)
Creatinine, Ser: 7.93 mg/dL — ABNORMAL HIGH (ref 0.61–1.24)
GFR calc Af Amer: 8 mL/min — ABNORMAL LOW
GFR calc non Af Amer: 7 mL/min — ABNORMAL LOW
Glucose, Bld: 89 mg/dL (ref 70–99)
Potassium: 3.7 mmol/L (ref 3.5–5.1)
Sodium: 141 mmol/L (ref 135–145)

## 2020-04-10 LAB — CBC
HCT: 28 % — ABNORMAL LOW (ref 39.0–52.0)
Hemoglobin: 8.7 g/dL — ABNORMAL LOW (ref 13.0–17.0)
MCH: 27.6 pg (ref 26.0–34.0)
MCHC: 31.1 g/dL (ref 30.0–36.0)
MCV: 88.9 fL (ref 80.0–100.0)
Platelets: 142 K/uL — ABNORMAL LOW (ref 150–400)
RBC: 3.15 MIL/uL — ABNORMAL LOW (ref 4.22–5.81)
RDW: 13.2 % (ref 11.5–15.5)
WBC: 11.5 K/uL — ABNORMAL HIGH (ref 4.0–10.5)
nRBC: 0 % (ref 0.0–0.2)

## 2020-04-10 LAB — CULTURE, BLOOD (ROUTINE X 2)
Culture: NO GROWTH
Special Requests: ADEQUATE

## 2020-04-10 LAB — MAGNESIUM: Magnesium: 1.9 mg/dL (ref 1.7–2.4)

## 2020-04-10 MED ORDER — SODIUM CHLORIDE 0.9 % IV SOLN
3.0000 g | INTRAVENOUS | Status: DC
  Administered 2020-04-10 – 2020-04-13 (×4): 3 g via INTRAVENOUS
  Filled 2020-04-10: qty 8
  Filled 2020-04-10: qty 3
  Filled 2020-04-10: qty 8
  Filled 2020-04-10 (×2): qty 3

## 2020-04-10 MED ORDER — POTASSIUM CHLORIDE 10 MEQ/100ML IV SOLN: 10.0000 meq | INTRAVENOUS | Status: DC

## 2020-04-10 MED ORDER — VITAMIN D (ERGOCALCIFEROL) 1.25 MG (50000 UNIT) PO CAPS
50000.0000 [IU] | ORAL_CAPSULE | ORAL | Status: DC
  Administered 2020-04-10: 50000 [IU] via ORAL
  Filled 2020-04-10: qty 1

## 2020-04-10 MED ORDER — EPOETIN ALFA 10000 UNIT/ML IJ SOLN
10000.0000 [IU] | INTRAMUSCULAR | Status: DC
  Administered 2020-04-10 – 2020-04-13 (×2): 10000 [IU] via INTRAVENOUS

## 2020-04-10 MED ORDER — POTASSIUM CHLORIDE CRYS ER 20 MEQ PO TBCR: 40.0000 meq | EXTENDED_RELEASE_TABLET | Freq: Once | ORAL | Status: DC

## 2020-04-10 MED ORDER — LOSARTAN POTASSIUM 50 MG PO TABS
100.0000 mg | ORAL_TABLET | Freq: Every day | ORAL | Status: DC
  Administered 2020-04-11 – 2020-04-14 (×4): 100 mg via ORAL
  Filled 2020-04-10 (×4): qty 2

## 2020-04-10 NOTE — Progress Notes (Signed)
ID Pt doing better More communicative Says he does not have any tooth pain  BP (!) 163/98 (BP Location: Left Arm)   Pulse 87   Temp 99.2 F (37.3 C) (Oral)   Resp 18   Ht 5\' 8"  (1.727 m)   Wt 105.7 kg   SpO2 99%   BMI 35.43 kg/m      O/e awake and alert Chest CTA HSs1s2 abd soft Rt chest - dialysis catheter  labs CBC Latest Ref Rng & Units 04/10/2020 04/09/2020 04/08/2020  WBC 4.0 - 10.5 K/uL 11.5(H) 13.2(H) 13.0(H)  Hemoglobin 13.0 - 17.0 g/dL 8.7(L) 8.9(L) 10.0(L)  Hematocrit 39 - 52 % 28.0(L) 26.7(L) 32.2(L)  Platelets 150 - 400 K/uL 142(L) 147(L) 161    CMP Latest Ref Rng & Units 04/10/2020 04/09/2020 04/08/2020  Glucose 70 - 99 mg/dL 89 89 104(H)  BUN 6 - 20 mg/dL 47(H) 36(H) 46(H)  Creatinine 0.61 - 1.24 mg/dL 7.93(H) 7.05(H) 8.29(H)  Sodium 135 - 145 mmol/L 141 138 140  Potassium 3.5 - 5.1 mmol/L 3.7 3.7 4.3  Chloride 98 - 111 mmol/L 101 97(L) 98  CO2 22 - 32 mmol/L 27 27 28   Calcium 8.9 - 10.3 mg/dL 6.2(LL) 6.6(L) 6.5(L)  Total Protein 6.5 - 8.1 g/dL - - 7.0  Total Bilirubin 0.3 - 1.2 mg/dL - - 0.8  Alkaline Phos 38 - 126 U/L - - 55  AST 15 - 41 U/L - - 32  ALT 0 - 44 U/L - - 26  MICRo Blood culture negative 04/05/20 04/06/20   UC CT maxilla Periapical lucency around right lower molar compatible with dental infection. No abscess  Impression/recommendation ?AKI with malignant HTN- started dialysis  Picture looks more like chronic kidney disease from the [place] rather than acute idney injury. p Pt has not been to a PCP in atleast 5 years so dont know his baseline creatinine.  New fever- resolved-- blood culture neg so far with BPH and outlet obstruction UTI would be a concern but U/A no wbc. Culture mutiple species No pneumonia. SARS cov2 neg, does not have diarrhea- so the CT finding of fluid in the intestine not significant  CT maxilla shows periapical abscess-. On  vanco and ceftriaxone- change to unasyn for another 3 days  HTN diagnosed on presentaion-  on amlodipine, hydralazine and metoprolol  BPH_ on flomax and avodart  Anemia of CKD  Autism  ID will sign off. Call if needed

## 2020-04-10 NOTE — Progress Notes (Signed)
Patient transported to Dialysis in recliner

## 2020-04-10 NOTE — Progress Notes (Signed)
[facility]  ROUNDING NOTE   Subjective:   Seen and examined on hemodialysis treatment. Hypotensive on treatment. Ultrafiltration discontinued.     HEMODIALYSIS FLOWSHEET:  Blood Flow Rate (mL/min): 400 mL/min Arterial Pressure (mmHg): -150 mmHg Venous Pressure (mmHg): 140 mmHg Transmembrane Pressure (mmHg): 70 mmHg Ultrafiltration Rate (mL/min): 430 mL/min Dialysate Flow Rate (mL/min): 600 ml/min Conductivity: Machine : 13.6 Conductivity: Machine : 13.6 Dialysis Fluid Bolus: Normal Saline Bolus Amount (mL): 250 mL    Objective:  Vital signs in last 24 hours:  Temp:  [98 F (36.7 C)-99.3 F (37.4 C)] 98.8 F (37.1 C) (08/13 1045) Pulse Rate:  [71-88] 71 (08/13 1145) Resp:  [16-23] 22 (08/13 1145) BP: (97-167)/(55-106) 97/68 (08/13 1145) SpO2:  [96 %-100 %] 100 % (08/13 1045) Weight:  [105.7 kg] 105.7 kg (08/13 0415)  Weight change:  Filed Weights   04/07/20 0455 04/08/20 0441 04/10/20 0415  Weight: 114.7 kg 100.4 kg 105.7 kg    Intake/Output: I/O last 3 completed shifts: In: 1821.9 [P.O.:1320; I.V.:101.9; IV Piggyback:400] Out: 1000 [Urine:1000]   Intake/Output this shift:  No intake/output data recorded.  Physical Exam: General: NAD, laying in bed  Head: Normocephalic, atraumatic. Moist oral mucosal membranes  Eyes: Anicteric, PERRL  Neck: Supple, trachea midline  Lungs:  Clear to auscultation  Heart: Regular rate and rhythm  Abdomen:  Soft, nontender,   Extremities:  no peripheral edema.  Neurologic: +learning disability, unable to give history  Skin: No lesions  Access RIJ permcath 8/9 Dr. Lucky Cowboy  GU Foley catheter with urine clear    Basic Metabolic Panel: Recent Labs  Lab 04/03/20 1809 04/04/20 1610 04/04/20 9604 04/05/20 0825 04/05/20 0825 04/06/20 5409 04/06/20 8119 04/07/20 0413 04/07/20 0413 04/08/20 1478 04/09/20 0546 04/10/20 0423 04/10/20 0712  NA  --  141   < > 139   < > 142  --  139  --  140 138 141  --   K  --   3.6   < > 3.2*   < > 3.7  --  4.0  --  4.3 3.7 3.7  --   CL  --  107   < > 102   < > 101  --  99  --  98 97* 101  --   CO2  --  16*   < > 21*   < > 26  --  25  --  28 27 27   --   GLUCOSE  --  102*   < > 102*   < > 106*  --  100*  --  104* 89 89  --   BUN  --  72*   < > 69*   < > 69*  --  50*  --  46* 36* 47*  --   CREATININE  --  14.15*   < > 14.11*   < > 13.38*  --  10.59*  --  8.29* 7.05* 7.93*  --   CALCIUM  --  4.6*   < > 4.8*   < > 4.9*   < > 5.6*   < > 6.5* 6.6* 6.2*  --   MG  --  1.5*  --   --   --  1.5*  --  1.6*  --  2.0  --   --  1.9  PHOS 6.3*  --   --  7.0*  --   --   --   --   --  5.3*  --   --   --    < > =  values in this interval not displayed.    Liver Function Tests: Recent Labs  Lab 04/04/20 0916 04/05/20 0825 04/06/20 0527 04/07/20 0413 04/08/20 0634  AST 22  --  22 31 32  ALT 36  --  28 29 26   ALKPHOS 51  --  48 44 55  BILITOT 0.8  --  0.7 0.9 0.8  PROT 6.4*  --  6.2* 6.2* 7.0  ALBUMIN 3.4* 3.1* 3.3* 3.1* 3.3*   Recent Labs  Lab 04/04/20 1357  LIPASE 71*   No results for input(s): AMMONIA in the last 168 hours.  CBC: Recent Labs  Lab 04/04/20 0916 04/04/20 0916 04/05/20 0825 04/05/20 0825 04/06/20 0527 04/06/20 0527 04/06/20 2055 04/07/20 0413 04/08/20 0634 04/09/20 0546 04/10/20 0423  WBC 10.6*   < > 9.6   < > 10.8*   < > 13.9* 12.8* 13.0* 13.2* 11.5*  NEUTROABS 8.2*  --  6.8  --  7.4  --   --  9.6*  --   --   --   HGB 9.3*   < > 8.7*   < > 8.4*   < > 9.6* 8.9* 10.0* 8.9* 8.7*  HCT 27.5*   < > 25.7*   < > 26.1*   < > 29.4* 26.7* 32.2* 26.7* 28.0*  MCV 81.8   < > 82.6   < > 86.1   < > 85.2 85.3 88.2 85.3 88.9  PLT 163   < > 162   < > 171   < > 178 168 161 147* 142*   < > = values in this interval not displayed.    Cardiac Enzymes: Recent Labs  Lab 04/04/20 1357 04/06/20 0730 04/08/20 0634  CKTOTAL 2,334* 2,023* 1,168*    BNP: Invalid input(s): POCBNP  CBG: No results for input(s): GLUCAP in the last 168  hours.  Microbiology: Results for orders placed or performed during the hospital encounter of 04/03/20  SARS Coronavirus 2 by RT PCR (hospital order, performed in [facility] hospital lab) Nasopharyngeal Nasopharyngeal Swab     Status: None   Collection Time: 04/03/20  3:06 AM   Specimen: Nasopharyngeal Swab  Result Value Ref Range Status   SARS Coronavirus 2 NEGATIVE NEGATIVE Final    Comment: (NOTE) SARS-CoV-2 target nucleic acids are NOT DETECTED.  The SARS-CoV-2 RNA is generally detectable in upper and lower respiratory specimens during the acute phase of infection. The lowest concentration of SARS-CoV-2 viral copies this assay can detect is 250 copies / mL. A negative result does not preclude SARS-CoV-2 infection and should not be used as the sole basis for treatment or other patient management decisions.  A negative result may occur with improper specimen collection / handling, submission of specimen other than nasopharyngeal swab, presence of viral mutation(s) within the areas targeted by this assay, and inadequate number of viral copies (<250 copies / mL). A negative result must be combined with clinical observations, patient history, and epidemiological information.  Fact Sheet for Patients:   StrictlyIdeas.no  Fact Sheet for Healthcare Providers: BankingDealers.co.za  This test is not yet approved or  cleared by the [place] FDA and has been authorized for detection and/or diagnosis of SARS-CoV-2 by FDA under an Emergency Use Authorization (EUA).  This EUA will remain in effect (meaning this test can be used) for the duration of the COVID-19 declaration under Section 564(b)(1) of the Act, 21 U.S.C. section 360bbb-3(b)(1), unless the authorization is terminated or revoked sooner.  Performed at [facility], [phone] [place], [place] [address]   Culture, blood (Routine X 2) w Reflex to ID Panel      Status: None   Collection Time: 04/05/20  5:08 PM   Specimen: BLOOD  Result Value Ref Range Status   Specimen Description BLOOD LAC  Final   Special Requests   Final    BOTTLES DRAWN AEROBIC AND ANAEROBIC Blood Culture adequate volume   Culture   Final    NO GROWTH 5 DAYS Performed at [facility], [address] [place], [place] [address]    Report Status 04/10/2020 FINAL  Final  CULTURE, BLOOD (ROUTINE X 2) w Reflex to ID Panel     Status: None (Preliminary result)   Collection Time: 04/06/20  7:30 AM   Specimen: BLOOD  Result Value Ref Range Status   Specimen Description BLOOD LEFT ARM  Final   Special Requests   Final    BOTTLES DRAWN AEROBIC AND ANAEROBIC Blood Culture results may not be optimal due to an excessive volume of blood received in culture bottles   Culture   Final    NO GROWTH 4 DAYS Performed at [facility], [address], [place], [place] [address]    Report Status PENDING  Incomplete  CULTURE, BLOOD (ROUTINE X 2) w Reflex to ID Panel     Status: None (Preliminary result)   Collection Time: 04/06/20  8:28 AM   Specimen: BLOOD  Result Value Ref Range Status   Specimen Description BLOOD BLOOD RIGHT HAND  Final   Special Requests   Final    BOTTLES DRAWN AEROBIC AND ANAEROBIC Blood Culture adequate volume   Culture   Final    NO GROWTH 4 DAYS Performed at [facility], [address], [place], [place] [address]    Report Status PENDING  Incomplete  Urine Culture     Status: Abnormal   Collection Time: 04/07/20  6:02 AM   Specimen: Urine, Random  Result Value Ref Range Status   Specimen Description   Final    URINE, RANDOM Performed at [facility], [address] [place], [place] [address]    Special Requests   Final    NONE Performed at [facility], [address] [place], [place] [address]    Culture MULTIPLE SPECIES PRESENT, SUGGEST RECOLLECTION (A)  Final   Report Status 04/08/2020 FINAL   Final    Coagulation Studies: No results for input(s): LABPROT, INR in the last 72 hours.  Urinalysis: No results for input(s): COLORURINE, LABSPEC, PHURINE, GLUCOSEU, HGBUR, BILIRUBINUR, KETONESUR, PROTEINUR, UROBILINOGEN, NITRITE, LEUKOCYTESUR in the last 72 hours.  Invalid input(s): APPERANCEUR    Imaging: CT MAXILLOFACIAL WO CONTRAST  Result Date: 04/08/2020 CLINICAL DATA:  Rule out tooth abscess. Tooth pain. Dialysis patient. EXAM: CT MAXILLOFACIAL WITHOUT CONTRAST TECHNIQUE: Multidetector CT imaging of the maxillofacial structures was performed. Multiplanar CT image reconstructions were also generated. COMPARISON:  None. FINDINGS: Osseous: Negative for fracture. Periapical lucency around right lower molar compatible with dental infection. No adjacent soft tissue swelling. No other dental lesion. Orbits: Normal orbit.  Negative for soft tissue mass or edema. Sinuses: Paranasal sinuses clear.  Mastoid clear bilaterally. Soft tissues: No soft tissue swelling or soft tissue abscess adjacent to the right lower molar periapical lucency. Dialysis central venous catheter from a right jugular approach. Tip not visualized. No hematoma or mass in the neck. Limited intracranial: Negative for acute abnormality. Chronic infarct left basal ganglia. IMPRESSION: Periapical lucency around right lower molar  compatible with dental infection. No soft tissue edema or abscess identified. Electronically Signed   By: Franchot Gallo M.D.   On: 04/08/2020 14:09     Medications:   . sodium chloride Stopped (04/07/20 0320)  . ampicillin-sulbactam (UNASYN) IV     . amLODipine  10 mg Oral Daily  . calcium acetate  1,334 mg Oral TID WC  . docusate sodium  200 mg Oral BID  . dutasteride  0.5 mg Oral Daily  . heparin  5,000 Units Subcutaneous Q8H  . hydrALAZINE  10 mg Oral Q8H  . magnesium oxide  400 mg Oral Daily  . metoprolol tartrate  100 mg Oral BID  . multivitamin  1 tablet Oral QHS  . sodium chloride  flush  10 mL Intravenous Q12H  . tamsulosin  0.4 mg Oral Daily   sodium chloride, acetaminophen **OR** acetaminophen, bisacodyl, hydrALAZINE, HYDROmorphone (DILAUDID) injection, LORazepam, ondansetron (ZOFRAN) IV  Assessment/ Plan:  Mr. Joseph Gilmore is a 49 y.o. black male with learning disability, autism, hypertension, obsessive compulsive disorder who is admitted to [facility] on 04/03/2020 for Hypocalcemia [E83.51] Enteritis [K52.9] Prolonged Q-T interval on ECG [R94.31] AKI (acute kidney injury) ([place]) [N17.9] Acute kidney injury ([place]) [N17.9]  1.  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  Dialysis today.  Next hemodialysis treatment   2. Secondary Hyperparathyroidism with Hypocalcemia. Improving after initiating hemodialysis.  PTH 99. With vitamin D deficiency Required calcium gluconate Phosphorus at goal.  - Continue PO calcium acetate with meals.  - Start ergocalciferol weekly.   3. Hypertension: emergency on admission.  Current regimen of losartan, metoprolol, hydralazine, amlodipine and tamsulosin. - discontinue hydralazine   4. Anemia with renal failure and iron deficiency: folic acid level is also low.  - scheduled EPO with dialysis treatment - Continue renal vitamin.    5. Obstructive uropathy: foley catheter placed - started on tamsulosin and dutasteride this admission - Appreciate urology input.     LOS: 7 Maureena Dabbs 8/13/202112:13 PM

## 2020-04-10 NOTE — Progress Notes (Signed)
Joseph Gilmore with Dialysis called to inform patient will be picked up around 1015 for dialysis this morning.

## 2020-04-10 NOTE — Progress Notes (Signed)
Pharmacy Antibiotic Note  Joseph Gilmore is a 49 y.o. male admitted on 04/03/2020 with N/V and SOB w/ h/o autism and OCD, presents w/ elevate SCr of 15.20 without a previous baseline, also notable hypocalcemia and diffuse bladder wall thickening s/t bladder outlet obstruction s/t enlarged prostate gland w/ PSA of 26.65. Patient is s/p perm-cath placement for HD.   Tmax 102.82F > afeb , tachypneic, w/ increase in WBC 10.8 >> 13.9 > 13.2, placed on ceftriaxone 1g for possible enteritis, possible urinary source. Pt has blisters on forearm. Possible dental infection.  Pharmacy has been consulted for vancomycin/ceftriaxone dosing. ID following.   Plan: Day 4 of abx. Pt was receiving vancomycin 1000 mg after every HD session. Vancomycin random prior to 3rd HD session returned 36. Will need to order another random level prior to the next HD session or 6 hours post hemodialysis session.   Ceftriaxone 2 g daily.   Goal random < 20 mcg/mL  Height: 5\' 8"  (172.7 cm) Weight: 105.7 kg (233 lb) IBW/kg (Calculated) : 68.4  Temp (24hrs), Avg:98.8 F (37.1 C), Min:98 F (36.7 C), Max:99.3 F (37.4 C)  Recent Labs  Lab 04/06/20 0527 04/06/20 0527 04/06/20 2055 04/07/20 0413 04/07/20 0717 04/08/20 0634 04/09/20 0546 04/10/20 0423  WBC 10.8*   < > 13.9* 12.8*  --  13.0* 13.2* 11.5*  CREATININE 13.38*  --   --  10.59*  --  8.29* 7.05* 7.93*  LATICACIDVEN  --   --   --  0.8 0.8  --   --   --   VANCORANDOM  --   --   --   --   --   --  36  --    < > = values in this interval not displayed.    Estimated Creatinine Clearance: 13.4 mL/min (A) (by C-G formula based on SCr of 7.93 mg/dL (H)).    Allergies  Allergen Reactions  . Chlorhexidine     Thank you for allowing pharmacy to be a part of this patient's care.  Eleonore Chiquito, PharmD, BCPS Clinical Pharmacist 04/10/2020 7:49 AM

## 2020-04-10 NOTE — Progress Notes (Signed)
PT Cancellation Note  Patient Details Name: Joseph Gilmore MRN: [id number] DOB: 27-May-1971   Cancelled Treatment:    Reason Eval/Treat Not Completed: Patient at procedure or test/unavailable: Pt at HD, will attempt to see pt at a future date/time as medically appropriate.     Linus Salmons PT, DPT 04/10/20, 2:10 PM

## 2020-04-10 NOTE — Progress Notes (Signed)
Patient scheduled for dialysis today. BP meds held in accordance. Patient currently resting in bed waiting for breakfast.

## 2020-04-10 NOTE — Progress Notes (Signed)
Patient's family member at beside who has his vaccine card which is needed to schedule outpatient dialysis. States she spoke to appropriate person earlier today and made them aware that she will bring his vaccination card to [facility] on Monday.

## 2020-04-10 NOTE — Care Management Important Message (Signed)
Important Message  Patient Details  Name: Joseph Gilmore MRN: [id number] Date of Birth: May 22, 1971   Medicare Important Message Given:  Yes     [name] 04/10/2020, 11:06 AM

## 2020-04-10 NOTE — Progress Notes (Addendum)
Tamika from Dialysis to give report. BP soft prevented any fluid removal. Cleaned for 3.5 hours. BP returned to 130/67 upon completion. Fever of 98.8 documented. Placed on 2L 02 during dialysis as he kept falling asleep and jumping. 02 stats remained at 100% on and off 02. Transporting back to room now.

## 2020-04-10 NOTE — Progress Notes (Signed)
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  S/p permcath placed. Continue on HD as per nephro. Waiting on outpatient HD spot    Fever: etiology unclear, possible tooth infection as recent of tooth pain & suppose to have dental work done but was canceled for some reason as per pt's father. Afebrile 48 hours. Continue on IV rocephin & vancomycin. Blood cxs NGTD. CXR neg.  CT abd/pelvis scan the other day showed possible enteritis. ID following and recs apprec  Right lower molar infection: as pt CT scan. Possible etiology of fevers but no fever in 48 hours. Continue on IV rocephin & vanco as per ID   BPH: continue w/ foley. Continue on flomax and proscar. W/ elevated PSA, urology will f/u as an outpatient  ACD: Hb & Hct are stable. Will transfuse if Hb <7. Will continue to monitor   Thrombocytopenia: etiology unclear. Will continue to monitor  Leukocytosis: likely secondary to infection. Trending down  Continue on IV abxs    HTN: continue on amlodipine and metoprolol  Weakness: PT recs SNF   Autism: continue w/ supportive care     DVT prophylaxis: heparin Code Status: full Family Communication:  Disposition Plan: likely d/c home w/ home health as pt's father refused SNF. Awaiting outpatient HD spot  Status is: Inpatient  Remains inpatient appropriate because:Unsafe d/c plan & still on IV abxs as per ID.  Waiting an outpatient HD spot   Dispo: The patient is from: Home              Anticipated d/c is to: Home               Anticipated d/c date is: 2 days              Patient currently is not medically stable to d/c.    Consultants:   nephro   Vascular surg  ID   Procedures:   Antimicrobials: rocephin, vancomycin    Subjective: Pt c/o fatigue  Objective: Vitals:   04/09/20 1506 04/09/20 1942 04/10/20 0100 04/10/20 0415  BP: 120/66 (!) 148/84 (!) 159/100 (!) 149/101  Pulse: 78 88 82 76  Resp: 17 16  16   Temp: 99.3 F (37.4 C) 99 F (37.2 C)  98 F (36.7 C)  TempSrc: Oral Oral    SpO2: 97% 96% 98% 98%  Weight:    105.7 kg  Height:        Intake/Output Summary (Last 24 hours) at 04/10/2020 0733 Last data filed at 04/10/2020 0600 Gross per 24 hour  Intake 801.9 ml  Output 700 ml  Net 101.9 ml   Filed Weights   04/07/20 0455 04/08/20 0441 04/10/20 0415  Weight: 114.7 kg 100.4 kg 105.7 kg    Examination:  General exam: Appears calm and comfortable  Respiratory system: Clear to auscultation. No rhonchi Cardiovascular system: S1 & S2+. No rubs, gallops or clicks. Gastrointestinal system: Abdomen is obese, soft and nontender. Hypoactive bowel sounds heard. Central nervous system: Alert and oriented. Moves all 4  extremities Psychiatry: Judgement and insight appear normal. Mood & affect appropriate     Data Reviewed: I have personally reviewed following labs and imaging studies  CBC: Recent Labs  Lab 04/04/20 0916 04/04/20 0916 04/05/20 0825 04/05/20 0825 04/06/20 0527 04/06/20 0527 04/06/20 2055 04/07/20 0413 04/08/20 0634 04/09/20 0546 04/10/20 0423  WBC 10.6*   < > 9.6   < > 10.8*   < > 13.9* 12.8* 13.0* 13.2* 11.5*  NEUTROABS 8.2*  --  6.8  --  7.4  --   --  9.6*  --   --   --   HGB 9.3*   < > 8.7*   < > 8.4*   < > 9.6* 8.9* 10.0* 8.9* 8.7*  HCT 27.5*   < > 25.7*   < > 26.1*   < > 29.4* 26.7* 32.2* 26.7* 28.0*  MCV 81.8   < > 82.6   < > 86.1   < > 85.2 85.3 88.2 85.3 88.9  PLT 163   < > 162   < > 171   < > 178 168 161 147* 142*   < > = values in this interval  not displayed.   Basic Metabolic Panel: Recent Labs  Lab 04/03/20 1809 04/04/20 1696 04/04/20 7893 04/05/20 0825 04/05/20 0825 04/06/20 0527 04/07/20 0413 04/08/20 0634 04/09/20 0546 04/10/20 0423  NA  --  141   < > 139   < > 142 139 140 138 141  K  --  3.6   < > 3.2*   < > 3.7 4.0 4.3 3.7 3.7  CL  --  107   < > 102   < > 101 99 98 97* 101  CO2  --  16*   < > 21*   < > 26 25 28 27 27   GLUCOSE  --  102*   < > 102*   < > 106* 100* 104* 89 89  BUN  --  72*   < > 69*   < > 69* 50* 46* 36* 47*  CREATININE  --  14.15*   < > 14.11*   < > 13.38* 10.59* 8.29* 7.05* 7.93*  CALCIUM  --  4.6*   < > 4.8*   < > 4.9* 5.6* 6.5* 6.6* 6.2*  MG  --  1.5*  --   --   --  1.5* 1.6* 2.0  --   --   PHOS 6.3*  --   --  7.0*  --   --   --  5.3*  --   --    < > = values in this interval not displayed.   GFR: Estimated Creatinine Clearance: 13.4 mL/min (A) (by C-G formula based on SCr of 7.93 mg/dL (H)). Liver Function Tests: Recent Labs  Lab 04/04/20 0916 04/05/20 0825 04/06/20 0527 04/07/20 0413 04/08/20 0634  AST 22  --  22 31 32  ALT 36  --  28 29 26   ALKPHOS 51  --  48 44 55  BILITOT 0.8  --  0.7 0.9 0.8  PROT 6.4*  --  6.2* 6.2* 7.0  ALBUMIN 3.4* 3.1* 3.3* 3.1* 3.3*   Recent Labs  Lab 04/04/20 1357  LIPASE 71*   No results for input(s): AMMONIA in the last 168 hours. Coagulation Profile: Recent Labs  Lab 04/06/20 0527  INR 1.2   Cardiac Enzymes: Recent Labs  Lab 04/04/20 1357 04/06/20 0730 04/08/20 0634  CKTOTAL 2,334* 2,023* 1,168*   BNP (last 3 results)  No results for input(s): PROBNP in the last 8760 hours. HbA1C: No results for input(s): HGBA1C in the last 72 hours. CBG: No results for input(s): GLUCAP in the last 168 hours. Lipid Profile: No results for input(s): CHOL, HDL, LDLCALC, TRIG, CHOLHDL, LDLDIRECT in the last 72 hours. Thyroid Function Tests: No results for input(s): TSH, T4TOTAL, FREET4, T3FREE, THYROIDAB in the last 72 hours. Anemia Panel: No  results for input(s): VITAMINB12, FOLATE, FERRITIN, TIBC, IRON, RETICCTPCT in the last 72 hours. Sepsis Labs: Recent Labs  Lab 04/07/20 0413 04/07/20 0717 04/08/20 0634 04/09/20 0546  PROCALCITON 0.77  --  0.98 0.90  LATICACIDVEN 0.8 0.8  --   --     Recent Results (from the past 240 hour(s))  SARS Coronavirus 2 by RT PCR (hospital order, performed in [facility] hospital lab) Nasopharyngeal Nasopharyngeal Swab     Status: None   Collection Time: 04/03/20  3:06 AM   Specimen: Nasopharyngeal Swab  Result Value Ref Range Status   SARS Coronavirus 2 NEGATIVE NEGATIVE Final    Comment: (NOTE) SARS-CoV-2 target nucleic acids are NOT DETECTED.  The SARS-CoV-2 RNA is generally detectable in upper and lower respiratory specimens during the acute phase of infection. The lowest concentration of SARS-CoV-2 viral copies this assay can detect is 250 copies / mL. A negative result does not preclude SARS-CoV-2 infection and should not be used as the sole basis for treatment or other patient management decisions.  A negative result may occur with improper specimen collection / handling, submission of specimen other than nasopharyngeal swab, presence of viral mutation(s) within the areas targeted by this assay, and inadequate number of viral copies (<250 copies / mL). A negative result must be combined with clinical observations, patient history, and epidemiological information.  Fact Sheet for Patients:   StrictlyIdeas.no  Fact Sheet for Healthcare Providers: BankingDealers.co.za  This test is not yet approved or  cleared by the [place] FDA and has been authorized for detection and/or diagnosis of SARS-CoV-2 by FDA under an Emergency Use Authorization (EUA).  This EUA will remain in effect (meaning this test can be used) for the duration of the COVID-19 declaration under Section 564(b)(1) of the Act, 21 U.S.C. section 360bbb-3(b)(1),  unless the authorization is terminated or revoked sooner.  Performed at [facility], [address] [place], [place] [address]   Culture, blood (Routine X 2) w Reflex to ID Panel     Status: None (Preliminary result)   Collection Time: 04/05/20  5:08 PM   Specimen: BLOOD  Result Value Ref Range Status   Specimen Description BLOOD LAC  Final   Special Requests   Final    BOTTLES DRAWN AEROBIC AND ANAEROBIC Blood Culture adequate volume   Culture   Final    NO GROWTH 4 DAYS Performed at [facility], [address], [place], [place] [address]    Report Status PENDING  Incomplete  CULTURE, BLOOD (ROUTINE X 2) w Reflex to ID Panel     Status: None (Preliminary result)   Collection Time: 04/06/20  7:30 AM   Specimen: BLOOD  Result Value Ref Range Status   Specimen Description BLOOD LEFT ARM  Final   Special Requests   Final    BOTTLES DRAWN AEROBIC AND ANAEROBIC Blood Culture results may not be optimal due to an excessive volume of blood received in culture bottles   Culture   Final    NO GROWTH 3 DAYS Performed at [facility], [address] [place], [place] [address]    Report Status PENDING  Incomplete  CULTURE, BLOOD (ROUTINE X 2) w Reflex to ID Panel     Status: None (Preliminary result)   Collection Time: 04/06/20  8:28 AM   Specimen: BLOOD  Result Value Ref Range Status   Specimen Description BLOOD BLOOD RIGHT HAND  Final   Special Requests   Final    BOTTLES DRAWN AEROBIC AND ANAEROBIC Blood Culture adequate volume   Culture   Final    NO GROWTH 3 DAYS Performed at [facility], [address], [place], [place] [address]    Report Status PENDING  Incomplete  Urine Culture     Status: Abnormal   Collection Time: 04/07/20  6:02 AM   Specimen: Urine, Random  Result Value Ref Range Status   Specimen Description   Final    URINE, RANDOM Performed at [facility], [address] [place], [place] [address]    Special  Requests   Final    NONE Performed at [facility], [address], [place], [place] [address]    Culture MULTIPLE SPECIES PRESENT, SUGGEST RECOLLECTION (A)  Final   Report Status 04/08/2020 FINAL  Final         Radiology Studies: US Venous Img Lower Bilateral (DVT)  Result Date: 04/08/2020 CLINICAL DATA:  49 year old male with a history fever EXAM: BILATERAL LOWER EXTREMITY VENOUS DOPPLER ULTRASOUND TECHNIQUE: Gray-scale sonography with graded compression, as well as color Doppler and duplex ultrasound were performed to evaluate the lower extremity deep venous systems from the level of the common femoral vein and including the common femoral, femoral, profunda femoral, popliteal and calf veins including the posterior tibial, peroneal and gastrocnemius veins when visible. The superficial great saphenous vein was also interrogated. Spectral Doppler was utilized to evaluate flow at rest and with distal augmentation maneuvers in the common femoral, femoral and popliteal veins. COMPARISON:  None. FINDINGS: RIGHT LOWER EXTREMITY Common Femoral Vein: No evidence of thrombus. Normal compressibility, respiratory phasicity and response to augmentation. Saphenofemoral Junction: No evidence of thrombus. Normal compressibility and flow on color Doppler imaging. Profunda Femoral Vein: No evidence of thrombus. Normal compressibility and flow on color Doppler imaging. Femoral Vein: No evidence of thrombus. Normal compressibility, respiratory phasicity and response to augmentation. Popliteal Vein: No evidence of thrombus. Normal compressibility, respiratory phasicity and response to augmentation. Calf Veins: No evidence of thrombus. Normal compressibility and flow on color Doppler imaging. Superficial Great Saphenous Vein: No evidence of thrombus. Normal compressibility and flow on color Doppler imaging. Other Findings:  None. LEFT LOWER EXTREMITY Common Femoral Vein: No evidence of thrombus. Normal  compressibility, respiratory phasicity and response to augmentation. Saphenofemoral Junction: No evidence of thrombus. Normal compressibility and flow on color Doppler imaging. Profunda Femoral Vein: No evidence of thrombus. Normal compressibility and flow on color Doppler imaging. Femoral Vein: No evidence of thrombus. Normal compressibility, respiratory phasicity and response to augmentation. Popliteal Vein: No evidence of thrombus. Normal compressibility, respiratory phasicity and response to augmentation. Calf Veins: No evidence of thrombus. Normal compressibility and flow on color Doppler imaging. Superficial Great Saphenous Vein: No evidence of thrombus. Normal compressibility and flow on color Doppler imaging. Other Findings:  None. IMPRESSION: Sonographic survey of the bilateral lower extremities negative for DVT Electronically Signed   By: Corrie Mckusick D.O.   On: 04/08/2020 10:04   CT MAXILLOFACIAL WO CONTRAST  Result Date: 04/08/2020 CLINICAL DATA:  Rule out tooth abscess. Tooth pain. Dialysis patient. EXAM: CT MAXILLOFACIAL  WITHOUT CONTRAST TECHNIQUE: Multidetector CT imaging of the maxillofacial structures was performed. Multiplanar CT image reconstructions were also generated. COMPARISON:  None. FINDINGS: Osseous: Negative for fracture. Periapical lucency around right lower molar compatible with dental infection. No adjacent soft tissue swelling. No other dental lesion. Orbits: Normal orbit.  Negative for soft tissue mass or edema. Sinuses: Paranasal sinuses clear.  Mastoid clear bilaterally. Soft tissues: No soft tissue swelling or soft tissue abscess adjacent to the right lower molar periapical lucency. Dialysis central venous catheter from a right jugular approach. Tip not visualized. No hematoma or mass in the neck. Limited intracranial: Negative for acute abnormality. Chronic infarct left basal ganglia. IMPRESSION: Periapical lucency around right lower molar compatible with dental infection.  No soft tissue edema or abscess identified. Electronically Signed   By: Franchot Gallo M.D.   On: 04/08/2020 14:09        Scheduled Meds: . amLODipine  10 mg Oral Daily  . calcium acetate  1,334 mg Oral TID WC  . docusate sodium  200 mg Oral BID  . dutasteride  0.5 mg Oral Daily  . heparin  5,000 Units Subcutaneous Q8H  . hydrALAZINE  10 mg Oral Q8H  . magnesium oxide  400 mg Oral Daily  . metoprolol tartrate  100 mg Oral BID  . multivitamin  1 tablet Oral QHS  . sodium chloride flush  10 mL Intravenous Q12H  . tamsulosin  0.4 mg Oral Daily  . vancomycin variable dose per unstable renal function (pharmacist dosing)   Does not apply See admin instructions   Continuous Infusions: . sodium chloride Stopped (04/07/20 0320)  . cefTRIAXone (ROCEPHIN)  IV 2 g (04/09/20 1824)     LOS: 7 days    Time spent: 30 mins    Wyvonnia Dusky, MD [facility] Pager [phone]  If 7PM-7AM, please contact night-coverage [email or web address] 04/10/2020, 7:33 AM

## 2020-04-10 NOTE — Progress Notes (Signed)
Joseph Rowan, NP notified about patient critical lab for a calcium 6.6.

## 2020-04-11 DIAGNOSIS — N186 End stage renal disease: Secondary | ICD-10-CM | POA: Diagnosis not present

## 2020-04-11 DIAGNOSIS — N138 Other obstructive and reflux uropathy: Secondary | ICD-10-CM | POA: Diagnosis not present

## 2020-04-11 DIAGNOSIS — N401 Enlarged prostate with lower urinary tract symptoms: Secondary | ICD-10-CM | POA: Diagnosis not present

## 2020-04-11 DIAGNOSIS — D696 Thrombocytopenia, unspecified: Secondary | ICD-10-CM | POA: Diagnosis not present

## 2020-04-11 LAB — CULTURE, BLOOD (ROUTINE X 2)
Culture: NO GROWTH
Culture: NO GROWTH
Special Requests: ADEQUATE

## 2020-04-11 LAB — BASIC METABOLIC PANEL
Anion gap: 10 (ref 5–15)
BUN: 29 mg/dL — ABNORMAL HIGH (ref 6–20)
CO2: 28 mmol/L (ref 22–32)
Calcium: 6.6 mg/dL — ABNORMAL LOW (ref 8.9–10.3)
Chloride: 100 mmol/L (ref 98–111)
Creatinine, Ser: 5.88 mg/dL — ABNORMAL HIGH (ref 0.61–1.24)
GFR calc Af Amer: 12 mL/min — ABNORMAL LOW (ref >60)
GFR calc non Af Amer: 10 mL/min — ABNORMAL LOW (ref >60)
Glucose, Bld: 89 mg/dL (ref 70–99)
Potassium: 3.7 mmol/L (ref 3.5–5.1)
Sodium: 138 mmol/L (ref 135–145)

## 2020-04-11 LAB — CALCIUM, IONIZED: Calcium, Ionized, Serum: 3.5 mg/dL — ABNORMAL LOW (ref 4.5–5.6)

## 2020-04-11 LAB — CBC
HCT: 27.1 % — ABNORMAL LOW (ref 39.0–52.0)
Hemoglobin: 8.7 g/dL — ABNORMAL LOW (ref 13.0–17.0)
MCH: 27.7 pg (ref 26.0–34.0)
MCHC: 32.1 g/dL (ref 30.0–36.0)
MCV: 86.3 fL (ref 80.0–100.0)
Platelets: 149 10*3/uL — ABNORMAL LOW (ref 150–400)
RBC: 3.14 MIL/uL — ABNORMAL LOW (ref 4.22–5.81)
RDW: 13.4 % (ref 11.5–15.5)
WBC: 12.7 10*3/uL — ABNORMAL HIGH (ref 4.0–10.5)
nRBC: 0 % (ref 0.0–0.2)

## 2020-04-11 NOTE — Progress Notes (Signed)
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  S/p permcath placed. Continue on HD as per nephro. Waiting on outpatient HD spot still   Fever: etiology unclear, possible tooth infection as recent of tooth pain & suppose to have dental work done but was canceled for some reason as per pt's father. Afebrile 48 hours. Continue on IV unasyn x 2 days more as per ID. Blood cxs NGTD. CXR neg.  CT abd/pelvis scan the other day showed possible enteritis. ID signed off   Right lower molar infection: as pt CT scan. Possible etiology of fevers but no fever in 48 hours. Continue on IV unasyn x 2 days more as per ID  BPH: continue w/ foley. Continue on flomax and proscar. W/ elevated PSA, urology will f/u as an outpatient  ACD: H&H are stable. No need for a transfusion at this time.   Thrombocytopenia: etiology unclear. Will continue to monitor  Leukocytosis: likely secondary to infection. Labile. Continue on IV abxs    HTN: continue on amlodipine and metoprolol  Weakness: PT recs SNF   Autism: continue w/ supportive care     DVT prophylaxis: heparin Code Status: full Family Communication:  Disposition Plan: likely d/c home w/ home health as pt's father refused SNF. Awaiting outpatient HD spot still  Status is: Inpatient  Remains inpatient appropriate because:Unsafe d/c plan & still on IV abxs as per ID.  Waiting an outpatient HD spot still    Dispo: The patient is from: Home              Anticipated d/c is to: Home              Anticipated  d/c date is: 2 days              Patient currently is not medically stable to d/c.    Consultants:   nephro   Vascular surg  ID   Procedures:   Antimicrobials: unasyn   Subjective: Pt c/o malaise  Objective: Vitals:   04/10/20 1507 04/10/20 1923 04/11/20 0410 04/11/20 0635  BP: (!) 163/98 (!) 145/79 (!) 157/99   Pulse: 87 93 80   Resp: 18 15 15    Temp: 99.2 F (37.3 C) 99.8 F (37.7 C) 99.1 F (37.3 C) 99.2 F (37.3 C)  TempSrc: Oral Oral Oral Oral  SpO2: 99% 99% 100% 93%  Weight:   114.8 kg   Height:        Intake/Output Summary (Last 24 hours) at 04/11/2020 0724 Last data filed at 04/11/2020 0600 Gross per 24 hour  Intake 1182.09 ml  Output 589 ml  Net 593.09 ml   Filed Weights   04/08/20 0441 04/10/20 0415 04/11/20 0410  Weight: 100.4 kg 105.7 kg 114.8 kg    Examination:  General exam:Appears calm and comfortable  Respiratory system: clear breath sounds b/l. No wheezes, rhonchi Cardiovascular system:S1 &S2+. No rubs, gallops or clicks. Gastrointestinal system:Abdomen is obese, soft and nontender. Normal bowel sounds heard. Central nervous system:Alert and oriented. Moves all  4 extremities Psychiatry:Judgement and insight appear abnormal. Mood &affect appropriate.       Data Reviewed: I have personally reviewed following labs and imaging studies  CBC: Recent Labs  Lab 04/04/20 0916 04/04/20 0916 04/05/20 0825 04/05/20 0825 04/06/20 0527 04/06/20 2055 04/07/20 0413 04/08/20 6789 04/09/20 0546 04/10/20 0423 04/11/20 0507  WBC 10.6*   < > 9.6   < > 10.8*   < > 12.8* 13.0* 13.2* 11.5* 12.7*  NEUTROABS 8.2*  --  6.8  --  7.4  --  9.6*  --   --   --   --   HGB 9.3*   < > 8.7*   < > 8.4*   < > 8.9* 10.0* 8.9* 8.7* 8.7*  HCT 27.5*   < > 25.7*   < > 26.1*   < > 26.7* 32.2* 26.7* 28.0* 27.1*  MCV 81.8   < > 82.6   < > 86.1   < > 85.3 88.2 85.3 88.9 86.3  PLT 163   < > 162   < > 171   < > 168 161 147* 142* 149*   < > = values in  this interval not displayed.   Basic Metabolic Panel: Recent Labs  Lab 04/04/20 0916 04/04/20 3810 04/05/20 0825 04/05/20 0825 04/06/20 1751 04/06/20 0258 04/07/20 0413 04/08/20 5277 04/09/20 0546 04/10/20 0423 04/10/20 0712 04/11/20 0507  NA 141   < > 139   < > 142   < > 139 140 138 141  --  138  K 3.6   < > 3.2*   < > 3.7   < > 4.0 4.3 3.7 3.7  --  3.7  CL 107   < > 102   < > 101   < > 99 98 97* 101  --  100  CO2 16*   < > 21*   < > 26   < > 25 28 27 27   --  28  GLUCOSE 102*   < > 102*   < > 106*   < > 100* 104* 89 89  --  89  BUN 72*   < > 69*   < > 69*   < > 50* 46* 36* 47*  --  29*  CREATININE 14.15*   < > 14.11*   < > 13.38*   < > 10.59* 8.29* 7.05* 7.93*  --  5.88*  CALCIUM 4.6*   < > 4.8*   < > 4.9*   < > 5.6* 6.5* 6.6* 6.2*  --  6.6*  MG 1.5*  --   --   --  1.5*  --  1.6* 2.0  --   --  1.9  --   PHOS  --   --  7.0*  --   --   --   --  5.3*  --   --   --   --    < > = values in this interval not displayed.   GFR: Estimated Creatinine Clearance: 18.9 mL/min (A) (by C-G formula based on SCr of 5.88 mg/dL (H)). Liver Function Tests: Recent Labs  Lab 04/04/20 0916 04/05/20 0825 04/06/20 0527 04/07/20 0413 04/08/20 0634  AST 22  --  22 31 32  ALT 36  --  28 29 26   ALKPHOS 51  --  48 44 55  BILITOT 0.8  --  0.7 0.9 0.8  PROT 6.4*  --  6.2* 6.2* 7.0  ALBUMIN 3.4* 3.1* 3.3* 3.1* 3.3*  Recent Labs  Lab 04/04/20 1357  LIPASE 71*   No results for input(s): AMMONIA in the last 168 hours. Coagulation Profile: Recent Labs  Lab 04/06/20 0527  INR 1.2   Cardiac Enzymes: Recent Labs  Lab 04/04/20 1357 04/06/20 0730 04/08/20 0634  CKTOTAL 2,334* 2,023* 1,168*   BNP (last 3 results) No results for input(s): PROBNP in the last 8760 hours. HbA1C: No results for input(s): HGBA1C in the last 72 hours. CBG: No results for input(s): GLUCAP in the last 168 hours. Lipid Profile: No results for input(s): CHOL, HDL, LDLCALC, TRIG, CHOLHDL, LDLDIRECT in the last 72  hours. Thyroid Function Tests: No results for input(s): TSH, T4TOTAL, FREET4, T3FREE, THYROIDAB in the last 72 hours. Anemia Panel: No results for input(s): VITAMINB12, FOLATE, FERRITIN, TIBC, IRON, RETICCTPCT in the last 72 hours. Sepsis Labs: Recent Labs  Lab 04/07/20 0413 04/07/20 0717 04/08/20 0634 04/09/20 0546  PROCALCITON 0.77  --  0.98 0.90  LATICACIDVEN 0.8 0.8  --   --     Recent Results (from the past 240 hour(s))  SARS Coronavirus 2 by RT PCR (hospital order, performed in [facility] hospital lab) Nasopharyngeal Nasopharyngeal Swab     Status: None   Collection Time: 04/03/20  3:06 AM   Specimen: Nasopharyngeal Swab  Result Value Ref Range Status   SARS Coronavirus 2 NEGATIVE NEGATIVE Final    Comment: (NOTE) SARS-CoV-2 target nucleic acids are NOT DETECTED.  The SARS-CoV-2 RNA is generally detectable in upper and lower respiratory specimens during the acute phase of infection. The lowest concentration of SARS-CoV-2 viral copies this assay can detect is 250 copies / mL. A negative result does not preclude SARS-CoV-2 infection and should not be used as the sole basis for treatment or other patient management decisions.  A negative result may occur with improper specimen collection / handling, submission of specimen other than nasopharyngeal swab, presence of viral mutation(s) within the areas targeted by this assay, and inadequate number of viral copies (<250 copies / mL). A negative result must be combined with clinical observations, patient history, and epidemiological information.  Fact Sheet for Patients:   StrictlyIdeas.no  Fact Sheet for Healthcare Providers: BankingDealers.co.za  This test is not yet approved or  cleared by the [place] FDA and has been authorized for detection and/or diagnosis of SARS-CoV-2 by FDA under an Emergency Use Authorization (EUA).  This EUA will remain in effect (meaning this  test can be used) for the duration of the COVID-19 declaration under Section 564(b)(1) of the Act, 21 U.S.C. section 360bbb-3(b)(1), unless the authorization is terminated or revoked sooner.  Performed at [facility], [address] [place], [place] [address]   Culture, blood (Routine X 2) w Reflex to ID Panel     Status: None   Collection Time: 04/05/20  5:08 PM   Specimen: BLOOD  Result Value Ref Range Status   Specimen Description BLOOD LAC  Final   Special Requests   Final    BOTTLES DRAWN AEROBIC AND ANAEROBIC Blood Culture adequate volume   Culture   Final    NO GROWTH 5 DAYS Performed at [facility], [address] [place], [place] [address]    Report Status 04/10/2020 FINAL  Final  CULTURE, BLOOD (ROUTINE X 2) w Reflex to ID Panel     Status: None (Preliminary result)   Collection Time: 04/06/20  7:30 AM   Specimen: BLOOD  Result Value Ref Range Status   Specimen Description BLOOD LEFT ARM  Final   Special Requests   Final    BOTTLES DRAWN AEROBIC AND ANAEROBIC Blood Culture results may not be optimal due to an excessive volume of blood received in culture bottles   Culture   Final    NO GROWTH 4 DAYS Performed at [facility], [address] [place], [place] [address]    Report Status PENDING  Incomplete  CULTURE, BLOOD (ROUTINE X 2) w Reflex to ID Panel     Status: None (Preliminary result)   Collection Time: 04/06/20  8:28 AM   Specimen: BLOOD  Result Value Ref Range Status   Specimen Description BLOOD BLOOD RIGHT HAND  Final   Special Requests   Final    BOTTLES DRAWN AEROBIC AND ANAEROBIC Blood Culture adequate volume   Culture   Final    NO GROWTH 4 DAYS Performed at [facility], [address] [place], [place] [address]    Report Status PENDING  Incomplete  Urine Culture     Status: Abnormal   Collection Time: 04/07/20  6:02 AM   Specimen: Urine, Random  Result Value Ref Range Status   Specimen Description    Final    URINE, RANDOM Performed at [facility], [address], [place], [place] [address]    Special Requests   Final    NONE Performed at [facility], [address] [place],  [address]    Culture MULTIPLE SPECIES PRESENT, SUGGEST RECOLLECTION (A)  Final   Report Status 04/08/2020 FINAL  Final         Radiology Studies: No results found.      Scheduled Meds: . amLODipine  10 mg Oral Daily  . calcium acetate  1,334 mg Oral TID WC  . docusate sodium  200 mg Oral BID  . dutasteride  0.5 mg Oral Daily  . [START ON 04/13/2020] epoetin (EPOGEN/PROCRIT) injection  10,000 Units Intravenous Q M,W,F-HD  . heparin  5,000 Units Subcutaneous Q8H  . losartan  100 mg Oral Daily  . magnesium oxide  400 mg Oral Daily  . metoprolol tartrate  100 mg Oral BID  . multivitamin  1 tablet Oral QHS  . sodium chloride flush  10 mL Intravenous Q12H  . tamsulosin  0.4 mg Oral Daily  . Vitamin D (Ergocalciferol)  50,000 Units Oral Q7 days   Continuous Infusions: . sodium chloride 10 mL/hr at 04/10/20 1632  . ampicillin-sulbactam (UNASYN) IV Stopped (04/10/20 1619)     LOS: 8 days    Time spent: 31 mins    Wyvonnia Dusky, MD [facility] Pager [phone]  If 7PM-7AM, please contact night-coverage [email or web address] 04/11/2020, 7:24 AM

## 2020-04-11 NOTE — Progress Notes (Signed)
Physical Therapy Treatment Patient Details Name: Joseph Gilmore MRN: [id number] DOB: 07-13-1971 Today's Date: 04/11/2020    History of Present Illness Pt is a 49 y.o. male w/ PMH of autism, murmur, and OCD. Per MD Impression, pt currently presents with: AKI 2/2 underlying CKD , metabolic acidosis, hypertensive urgency, hypocalcemia, anemia, elevated LFTs, elevated lipase, vomiting, BPH. ESRD, and sepsis.    PT Comments    Patient agrees to PT treatment to perform exercises but does not want to walk until Monday and does not want to get into the recliner. He performs open chain exercises in supine and sitting AROM. He has weakness in LLE hip flex and poor height in bridging. He follows commands with a delaed response. His father is present in the room and unable to change his mind to try walking today. Patient will conitnue to benefit from skilled PT to improve mobility and strength.   Follow Up Recommendations  SNF     Equipment Recommendations  None recommended by PT (father says he doesnt want SNF for his son)    Recommendations for Other Services       Precautions / Restrictions Restrictions Weight Bearing Restrictions: No    Mobility  Bed Mobility Overal bed mobility: Needs Assistance Bed Mobility: Supine to Sit;Sit to Supine     Supine to sit: Mod assist Sit to supine: Min guard   General bed mobility comments: VC for sequencing  Transfers Overall transfer level:  (Pt refused)                  Ambulation/Gait Ambulation/Gait assistance:  (Pt refused)               Stairs             Wheelchair Mobility    Modified Rankin (Stroke Patients Only)       Balance Overall balance assessment: Modified Independent Sitting-balance support: Single extremity supported;Feet supported Sitting balance-Leahy Scale: Good       Standing balance-Leahy Scale:  (Pt refused to stand)                              Cognition  Arousal/Alertness: Awake/alert Behavior During Therapy: Flat affect Overall Cognitive Status: History of cognitive impairments - at baseline                                        Exercises      General Comments        Pertinent Vitals/Pain Pain Assessment: No/denies pain    Home Living                      Prior Function            PT Goals (current goals can now be found in the care plan section) Acute Rehab PT Goals PT Goal Formulation: Patient unable to participate in goal setting    Frequency    Min 2X/week      PT Plan Current plan remains appropriate    Co-evaluation              AM-PAC PT "6 Clicks" Mobility   Outcome Measure  Help needed turning from your back to your side while in a flat bed without using bedrails?: A Little Help needed moving from lying on your back to sitting on  the side of a flat bed without using bedrails?: A Little Help needed moving to and from a bed to a chair (including a wheelchair)?: A Little Help needed standing up from a chair using your arms (e.g., wheelchair or bedside chair)?: A Little Help needed to walk in hospital room?: A Lot Help needed climbing 3-5 steps with a railing? : Total 6 Click Score: 15    End of Session   Activity Tolerance: Patient tolerated treatment well Patient left: in bed;with bed alarm set Nurse Communication: Mobility status PT Visit Diagnosis: Unsteadiness on feet (R26.81);Muscle weakness (generalized) (M62.81);Difficulty in walking, not elsewhere classified (R26.2)     Time: 9379-0240 PT Time Calculation (min) (ACUTE ONLY): 20 min  Charges:  $Therapeutic Exercise: 8-22 mins $Therapeutic Activity: 8-22 mins                        Alanson Puls, PT DPT 04/11/2020, 10:17 AM

## 2020-04-11 NOTE — Progress Notes (Signed)
Joseph Gilmore  MRN: [id number]  DOB/AGE: Jan 19, 1971 49 y.o.  Primary Care Physician:Niemeyer, Meindert, MD  Admit date: 04/03/2020  Chief Complaint:  Chief Complaint  Patient presents with  . Emesis  . Shortness of Breath    S-Pt presented on  04/03/2020 with  Chief Complaint  Patient presents with  . Emesis  . Shortness of Breath  . Patient only responded yes and no.  Patient offers no specific complaint.  On direct questioning patient offers no complaint of chest pain or shortness of breath  Medications . amLODipine  10 mg Oral Daily  . calcium acetate  1,334 mg Oral TID WC  . docusate sodium  200 mg Oral BID  . dutasteride  0.5 mg Oral Daily  . [START ON 04/13/2020] epoetin (EPOGEN/PROCRIT) injection  10,000 Units Intravenous Q M,W,F-HD  . heparin  5,000 Units Subcutaneous Q8H  . losartan  100 mg Oral Daily  . magnesium oxide  400 mg Oral Daily  . metoprolol tartrate  100 mg Oral BID  . multivitamin  1 tablet Oral QHS  . sodium chloride flush  10 mL Intravenous Q12H  . tamsulosin  0.4 mg Oral Daily  . Vitamin D (Ergocalciferol)  50,000 Units Oral Q7 days         QHU:TMLYY from the symptoms mentioned above,there are no other symptoms referable to all systems reviewed.  Physical Exam: Vital signs in last 24 hours: Temp:  [98.1 F (36.7 C)-99.8 F (37.7 C)] 98.1 F (36.7 C) (08/14 0826) Pulse Rate:  [68-93] 81 (08/14 0826) Resp:  [15-23] 16 (08/14 0826) BP: (96-179)/(55-110) 179/110 (08/14 0826) SpO2:  [93 %-100 %] 99 % (08/14 0826) Weight:  [114.8 kg] 114.8 kg (08/14 0410) Weight change: 9.117 kg Last BM Date: 04/09/20  Intake/Output from previous day: 08/13 0701 - 08/14 0700 In: 1182.1 [P.O.:1080; I.V.:2.1; IV Piggyback:100] Out: 589 [Urine:700] No intake/output data recorded.   Physical Exam: General- pt is awake,alert, oriented to time place and person Resp- No acute REsp distress, CTA B/L NO Rhonchi CVS- S1S2 regular in rate and rhythm GIT- BS+,  soft, NT, ND EXT- NO LE Edema, Cyanosis Access patient has tunneled catheter in situ  Lab Results: CBC Recent Labs    04/10/20 0423 04/11/20 0507  WBC 11.5* 12.7*  HGB 8.7* 8.7*  HCT 28.0* 27.1*  PLT 142* 149*    BMET Recent Labs    04/10/20 0423 04/11/20 0507  NA 141 138  K 3.7 3.7  CL 101 100  CO2 27 28  GLUCOSE 89 89  BUN 47* 29*  CREATININE 7.93* 5.88*  CALCIUM 6.2* 6.6*    MICRO Recent Results (from the past 240 hour(s))  SARS Coronavirus 2 by RT PCR (hospital order, performed in [facility] hospital lab) Nasopharyngeal Nasopharyngeal Swab     Status: None   Collection Time: 04/03/20  3:06 AM   Specimen: Nasopharyngeal Swab  Result Value Ref Range Status   SARS Coronavirus 2 NEGATIVE NEGATIVE Final    Comment: (NOTE) SARS-CoV-2 target nucleic acids are NOT DETECTED.  The SARS-CoV-2 RNA is generally detectable in upper and lower respiratory specimens during the acute phase of infection. The lowest concentration of SARS-CoV-2 viral copies this assay can detect is 250 copies / mL. A negative result does not preclude SARS-CoV-2 infection and should not be used as the sole basis for treatment or other patient management decisions.  A negative result may occur with improper specimen collection / handling, submission of specimen other than nasopharyngeal swab,  presence of viral mutation(s) within the areas targeted by this assay, and inadequate number of viral copies (<250 copies / mL). A negative result must be combined with clinical observations, patient history, and epidemiological information.  Fact Sheet for Patients:   StrictlyIdeas.no  Fact Sheet for Healthcare Providers: BankingDealers.co.za  This test is not yet approved or  cleared by the [place] FDA and has been authorized for detection and/or diagnosis of SARS-CoV-2 by FDA under an Emergency Use Authorization (EUA).  This EUA will remain in  effect (meaning this test can be used) for the duration of the COVID-19 declaration under Section 564(b)(1) of the Act, 21 U.S.C. section 360bbb-3(b)(1), unless the authorization is terminated or revoked sooner.  Performed at [facility], [address] [place], [place] [address]   Culture, blood (Routine X 2) w Reflex to ID Panel     Status: None   Collection Time: 04/05/20  5:08 PM   Specimen: BLOOD  Result Value Ref Range Status   Specimen Description BLOOD LAC  Final   Special Requests   Final    BOTTLES DRAWN AEROBIC AND ANAEROBIC Blood Culture adequate volume   Culture   Final    NO GROWTH 5 DAYS Performed at [facility], [address] [place], [place] [address]    Report Status 04/10/2020 FINAL  Final  CULTURE, BLOOD (ROUTINE X 2) w Reflex to ID Panel     Status: None   Collection Time: 04/06/20  7:30 AM   Specimen: BLOOD  Result Value Ref Range Status   Specimen Description BLOOD LEFT ARM  Final   Special Requests   Final    BOTTLES DRAWN AEROBIC AND ANAEROBIC Blood Culture results may not be optimal due to an excessive volume of blood received in culture bottles   Culture   Final    NO GROWTH 5 DAYS Performed at [facility], [address] [place], [place] [address]    Report Status 04/11/2020 FINAL  Final  CULTURE, BLOOD (ROUTINE X 2) w Reflex to ID Panel     Status: None   Collection Time: 04/06/20  8:28 AM   Specimen: BLOOD  Result Value Ref Range Status   Specimen Description BLOOD BLOOD RIGHT HAND  Final   Special Requests   Final    BOTTLES DRAWN AEROBIC AND ANAEROBIC Blood Culture adequate volume   Culture   Final    NO GROWTH 5 DAYS Performed at [facility], [address], [place], [place] [address]    Report Status 04/11/2020 FINAL  Final  Urine Culture     Status: Abnormal   Collection Time: 04/07/20  6:02 AM   Specimen: Urine, Random  Result Value Ref Range Status   Specimen Description   Final     URINE, RANDOM Performed at [facility], [address], [place], [place] [address]    Special Requests   Final    NONE Performed at [facility], [address] [place], [place] [address]    Culture MULTIPLE SPECIES PRESENT, SUGGEST RECOLLECTION (A)  Final   Report Status 04/08/2020 FINAL  Final      Lab Results  Component Value Date   PTH 99 (H) 04/03/2020   CALCIUM 6.6 (L) 04/11/2020   PHOS 5.3 (H) 04/08/2020               Impression:   Patient is a 49 year old African-American male with a past medical history of learning disability, autism, hypertension,  obsessive-compulsive disorder who was admitted to the hospital on April 03, 2020 with chief complaint of Hypocalcemia Enteritis Prolonged QT interval on EKG Acute kidney injury  1)Renal ESRD secondary to obstructive uropathy and hypertension Patient extensive work-up was done which came back negative for hepatitis/HIV/ANCA/SPEP and UPEP/ANA/anti-GBM antibodies/complement levels. Patient renal ultrasound showed bilaterally small kidneys at around 7.3 cm. Patient had first day of dialysis on August 9. Patient had tunnel catheter placement on April 06, 2020. Patient was last dialyzed yesterday No need for dialysis today  2)HTN Patient was admitted with hypertensive emergency on admission. Patient blood pressure is now much better   3)Anemia of chronic disease  HGb is not at goal (9--11) Patient is on Epogen  4) secondary hyperparathyroidism -CKD Mineral-Bone Disorder   Secondary Hyperparathyroidism present Phosphorus at goal.   5) obstructive uropathy Patient is on tamsulosin and dutasteride Patient has catheter in situ Primary team and urology are following   6) electrolytes   sodium Normonatremic   potassium Normokalemic    7)Acid base Co2 at goal     Plan:   No need for renal placement therapy today    Zali Kamaka s [facility] 04/11/2020, 10:07 AM

## 2020-04-11 NOTE — Progress Notes (Signed)
Pt is refusing to allow staff to perform any hygienic tasks especially foley catheter care.

## 2020-04-12 DIAGNOSIS — D638 Anemia in other chronic diseases classified elsewhere: Secondary | ICD-10-CM | POA: Diagnosis not present

## 2020-04-12 DIAGNOSIS — K047 Periapical abscess without sinus: Secondary | ICD-10-CM | POA: Diagnosis not present

## 2020-04-12 DIAGNOSIS — N186 End stage renal disease: Secondary | ICD-10-CM | POA: Diagnosis not present

## 2020-04-12 LAB — BASIC METABOLIC PANEL
Anion gap: 11 (ref 5–15)
BUN: 40 mg/dL — ABNORMAL HIGH (ref 6–20)
CO2: 27 mmol/L (ref 22–32)
Calcium: 6.3 mg/dL — CL (ref 8.9–10.3)
Chloride: 103 mmol/L (ref 98–111)
Creatinine, Ser: 7.11 mg/dL — ABNORMAL HIGH (ref 0.61–1.24)
GFR calc Af Amer: 10 mL/min — ABNORMAL LOW (ref >60)
GFR calc non Af Amer: 8 mL/min — ABNORMAL LOW (ref >60)
Glucose, Bld: 83 mg/dL (ref 70–99)
Potassium: 3.6 mmol/L (ref 3.5–5.1)
Sodium: 141 mmol/L (ref 135–145)

## 2020-04-12 LAB — CBC
HCT: 27.1 % — ABNORMAL LOW (ref 39.0–52.0)
Hemoglobin: 8.4 g/dL — ABNORMAL LOW (ref 13.0–17.0)
MCH: 27.6 pg (ref 26.0–34.0)
MCHC: 31 g/dL (ref 30.0–36.0)
MCV: 89.1 fL (ref 80.0–100.0)
Platelets: 152 10*3/uL (ref 150–400)
RBC: 3.04 MIL/uL — ABNORMAL LOW (ref 4.22–5.81)
RDW: 13.4 % (ref 11.5–15.5)
WBC: 12.1 10*3/uL — ABNORMAL HIGH (ref 4.0–10.5)
nRBC: 0 % (ref 0.0–0.2)

## 2020-04-12 LAB — ALBUMIN: Albumin: 2.8 g/dL — ABNORMAL LOW (ref 3.5–5.0)

## 2020-04-12 MED ORDER — TUBERCULIN PPD 5 UNIT/0.1ML ID SOLN: 5.0000 [IU] | Freq: Once | INTRADERMAL | Status: DC

## 2020-04-12 NOTE — Progress Notes (Addendum)
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  S/p permcath placed. Continue on HD as per nephro.  Still waiting on HD outpatient spot  Fever: etiology unclear, possible tooth infection as recent of tooth pain & suppose to have dental work done but was canceled for some reason as per pt's father. Continue on IV unasyn x 1 day more as per ID. Blood cxs NGTD. CXR neg. CT abd/pelvis scan the other day showed possible enteritis. ID signed off. Resolved  Right lower molar infection: as pt CT scan. Possible etiology of fevers but no fever in 72 hours. Continue on IV unasyn x 1 days more as per ID  Hypocalcemia: continue on Ca supplements   BPH: continue w/ foley. Continue on flomax and proscar. W/ elevated PSA, urology will f/u as an outpatient  ACD: Hb & Hct are labile. Will transfuse if Hb <7.   Thrombocytopenia: resolved  Leukocytosis: likely secondary to infection. Labile. Continue on IV abxs    HTN: continue on amlodipine and metoprolol  Weakness: PT recs SNF but pt's father refused. Will go home w/ home health  Autism: continue w/ supportive care     DVT prophylaxis: heparin Code Status: full Family Communication:  Disposition Plan: likely d/c home w/ home health as pt's father refused SNF. Still waiting on outpatient HD spot  Status is: Inpatient  Remains inpatient appropriate because:Unsafe d/c plan & still on IV abxs x 1 day as per ID.  Waiting an outpatient HD spot still    Dispo: The patient is from: Home               Anticipated d/c is to: Home              Anticipated d/c date is: 1 day              Patient currently is not medically stable to d/c.    Consultants:   nephro   Vascular surg  ID   Procedures:   Antimicrobials: unasyn   Subjective: Pt c/o fatigue  Objective: Vitals:   04/11/20 1139 04/11/20 1647 04/11/20 1954 04/12/20 0353  BP: 136/86 (!) 156/97 (!) 163/89 (!) 150/90  Pulse: 76 76 83 79  Resp: 19  20 18   Temp: 98.9 F (37.2 C) 97.6 F (36.4 C) 98.1 F (36.7 C) 98.7 F (37.1 C)  TempSrc: Oral Oral Oral Oral  SpO2: 100% 100% 100% 100%  Weight:    114.3 kg  Height:        Intake/Output Summary (Last 24 hours) at 04/12/2020 0724 Last data filed at 04/12/2020 0358 Gross per 24 hour  Intake 604.68 ml  Output 2225 ml  Net -1620.32 ml   Filed Weights   04/10/20 0415 04/11/20 0410 04/12/20 0353  Weight: 105.7 kg 114.8 kg 114.3 kg    Examination:  General exam:Appears calm and comfortable  Respiratory system: clear breath sounds b/l.No rales Cardiovascular system:S1 &S2+. No rubs, gallops or clicks. Gastrointestinal system:Abdomen is obese, soft and nontender. Normal bowel sounds  heard. Central nervous system:Alert and oriented. Moves all 4 extremities Psychiatry:Judgement and insight appear abnormal. Mood &affect appropriate     Data Reviewed: I have personally reviewed following labs and imaging studies  CBC: Recent Labs  Lab 04/05/20 0825 04/05/20 0825 04/06/20 0527 04/06/20 2055 04/07/20 0413 04/07/20 0413 04/08/20 0177 04/09/20 0546 04/10/20 0423 04/11/20 0507 04/12/20 0437  WBC 9.6   < > 10.8*   < > 12.8*   < > 13.0* 13.2* 11.5* 12.7* 12.1*  NEUTROABS 6.8  --  7.4  --  9.6*  --   --   --   --   --   --   HGB 8.7*   < > 8.4*   < > 8.9*   < > 10.0* 8.9* 8.7* 8.7* 8.4*  HCT 25.7*   < > 26.1*   < > 26.7*   < > 32.2* 26.7* 28.0* 27.1* 27.1*  MCV 82.6   < > 86.1   < > 85.3   < > 88.2 85.3 88.9 86.3 89.1  PLT 162   < > 171   < > 168    < > 161 147* 142* 149* 152   < > = values in this interval not displayed.   Basic Metabolic Panel: Recent Labs  Lab 04/05/20 0825 04/05/20 0825 04/06/20 9390 04/06/20 3009 04/07/20 0413 04/07/20 0413 04/08/20 2330 04/09/20 0762 04/10/20 0423 04/10/20 2633 04/11/20 0507 04/12/20 0437  NA 139   < > 142   < > 139   < > 140 138 141  --  138 141  K 3.2*   < > 3.7   < > 4.0   < > 4.3 3.7 3.7  --  3.7 3.6  CL 102   < > 101   < > 99   < > 98 97* 101  --  100 103  CO2 21*   < > 26   < > 25   < > 28 27 27   --  28 27  GLUCOSE 102*   < > 106*   < > 100*   < > 104* 89 89  --  89 83  BUN 69*   < > 69*   < > 50*   < > 46* 36* 47*  --  29* 40*  CREATININE 14.11*   < > 13.38*   < > 10.59*   < > 8.29* 7.05* 7.93*  --  5.88* 7.11*  CALCIUM 4.8*   < > 4.9*   < > 5.6*   < > 6.5* 6.6* 6.2*  --  6.6* 6.3*  MG  --   --  1.5*  --  1.6*  --  2.0  --   --  1.9  --   --   PHOS 7.0*  --   --   --   --   --  5.3*  --   --   --   --   --    < > = values in this interval not displayed.   GFR: Estimated Creatinine Clearance: 15.6 mL/min (A) (by C-G formula based on SCr of 7.11 mg/dL (H)). Liver Function Tests: Recent Labs  Lab 04/05/20 0825 04/06/20 0527 04/07/20 0413 04/08/20 0634 04/12/20 0437  AST  --  22 31 32  --   ALT  --  28 29 26   --   ALKPHOS  --  48 44 55  --   BILITOT  --  0.7 0.9 0.8  --  PROT  --  6.2* 6.2* 7.0  --   ALBUMIN 3.1* 3.3* 3.1* 3.3* 2.8*   No results for input(s): LIPASE, AMYLASE in the last 168 hours. No results for input(s): AMMONIA in the last 168 hours. Coagulation Profile: Recent Labs  Lab 04/06/20 0527  INR 1.2   Cardiac Enzymes: Recent Labs  Lab 04/06/20 0730 04/08/20 0634  CKTOTAL 2,023* 1,168*   BNP (last 3 results) No results for input(s): PROBNP in the last 8760 hours. HbA1C: No results for input(s): HGBA1C in the last 72 hours. CBG: No results for input(s): GLUCAP in the last 168 hours. Lipid Profile: No results for input(s): CHOL, HDL,  LDLCALC, TRIG, CHOLHDL, LDLDIRECT in the last 72 hours. Thyroid Function Tests: No results for input(s): TSH, T4TOTAL, FREET4, T3FREE, THYROIDAB in the last 72 hours. Anemia Panel: No results for input(s): VITAMINB12, FOLATE, FERRITIN, TIBC, IRON, RETICCTPCT in the last 72 hours. Sepsis Labs: Recent Labs  Lab 04/07/20 0413 04/07/20 0717 04/08/20 0634 04/09/20 0546  PROCALCITON 0.77  --  0.98 0.90  LATICACIDVEN 0.8 0.8  --   --     Recent Results (from the past 240 hour(s))  SARS Coronavirus 2 by RT PCR (hospital order, performed in [facility] hospital lab) Nasopharyngeal Nasopharyngeal Swab     Status: None   Collection Time: 04/03/20  3:06 AM   Specimen: Nasopharyngeal Swab  Result Value Ref Range Status   SARS Coronavirus 2 NEGATIVE NEGATIVE Final    Comment: (NOTE) SARS-CoV-2 target nucleic acids are NOT DETECTED.  The SARS-CoV-2 RNA is generally detectable in upper and lower respiratory specimens during the acute phase of infection. The lowest concentration of SARS-CoV-2 viral copies this assay can detect is 250 copies / mL. A negative result does not preclude SARS-CoV-2 infection and should not be used as the sole basis for treatment or other patient management decisions.  A negative result may occur with improper specimen collection / handling, submission of specimen other than nasopharyngeal swab, presence of viral mutation(s) within the areas targeted by this assay, and inadequate number of viral copies (<250 copies / mL). A negative result must be combined with clinical observations, patient history, and epidemiological information.  Fact Sheet for Patients:   StrictlyIdeas.no  Fact Sheet for Healthcare Providers: BankingDealers.co.za  This test is not yet approved or  cleared by the [place] FDA and has been authorized for detection and/or diagnosis of SARS-CoV-2 by FDA under an Emergency Use Authorization  (EUA).  This EUA will remain in effect (meaning this test can be used) for the duration of the COVID-19 declaration under Section 564(b)(1) of the Act, 21 U.S.C. section 360bbb-3(b)(1), unless the authorization is terminated or revoked sooner.  Performed at [facility], [address] [place], [place] [address]   Culture, blood (Routine X 2) w Reflex to ID Panel     Status: None   Collection Time: 04/05/20  5:08 PM   Specimen: BLOOD  Result Value Ref Range Status   Specimen Description BLOOD LAC  Final   Special Requests   Final    BOTTLES DRAWN AEROBIC AND ANAEROBIC Blood Culture adequate volume   Culture   Final    NO GROWTH 5 DAYS Performed at [facility], [address], [place], [place] [address]    Report Status 04/10/2020 FINAL  Final  CULTURE, BLOOD (ROUTINE X 2) w Reflex to ID Panel     Status: None   Collection Time: 04/06/20  7:30 AM   Specimen:  BLOOD  Result Value Ref Range Status   Specimen Description BLOOD LEFT ARM  Final   Special Requests   Final    BOTTLES DRAWN AEROBIC AND ANAEROBIC Blood Culture results may not be optimal due to an excessive volume of blood received in culture bottles   Culture   Final    NO GROWTH 5 DAYS Performed at [facility], [address] [place], [place] [address]    Report Status 04/11/2020 FINAL  Final  CULTURE, BLOOD (ROUTINE X 2) w Reflex to ID Panel     Status: None   Collection Time: 04/06/20  8:28 AM   Specimen: BLOOD  Result Value Ref Range Status   Specimen Description BLOOD BLOOD RIGHT HAND  Final   Special Requests   Final    BOTTLES DRAWN AEROBIC AND ANAEROBIC Blood Culture adequate volume   Culture   Final    NO GROWTH 5 DAYS Performed at [facility], [address], [place], [place] [address]    Report Status 04/11/2020 FINAL  Final  Urine Culture     Status: Abnormal   Collection Time: 04/07/20  6:02 AM   Specimen: Urine, Random  Result Value Ref Range Status    Specimen Description   Final    URINE, RANDOM Performed at [facility], [address], [place], [place] [address]    Special Requests   Final    NONE Performed at [facility], [address] [place], [place] [address]    Culture MULTIPLE SPECIES PRESENT, SUGGEST RECOLLECTION (A)  Final   Report Status 04/08/2020 FINAL  Final         Radiology Studies: No results found.      Scheduled Meds: . amLODipine  10 mg Oral Daily  . calcium acetate  1,334 mg Oral TID WC  . docusate sodium  200 mg Oral BID  . dutasteride  0.5 mg Oral Daily  . [START ON 04/13/2020] epoetin (EPOGEN/PROCRIT) injection  10,000 Units Intravenous Q M,W,F-HD  . heparin  5,000 Units Subcutaneous Q8H  . losartan  100 mg Oral Daily  . magnesium oxide  400 mg Oral Daily  . metoprolol tartrate  100 mg Oral BID  . multivitamin  1 tablet Oral QHS  . sodium chloride flush  10 mL Intravenous Q12H  . tamsulosin  0.4 mg Oral Daily  . Vitamin D (Ergocalciferol)  50,000 Units Oral Q7 days   Continuous Infusions: . sodium chloride Stopped (04/10/20 1700)  . ampicillin-sulbactam (UNASYN) IV 3 g (04/11/20 1642)     LOS: 9 days    Time spent: 30 mins    Wyvonnia Dusky, MD [facility] Pager [phone]  If 7PM-7AM, please contact night-coverage [email or web address] 04/12/2020, 7:24 AM

## 2020-04-12 NOTE — Plan of Care (Signed)
  Problem: Education: Goal: Knowledge of General Education information will improve Description Including pain rating scale, medication(s)/side effects and non-pharmacologic comfort measures Outcome: Progressing   Problem: Health Behavior/Discharge Planning: Goal: Ability to manage health-related needs will improve Outcome: Progressing   

## 2020-04-12 NOTE — Progress Notes (Signed)
Joseph Gilmore  MRN: [id number]  DOB/AGE: 1970-12-05 49 y.o.  Primary Care Physician:Niemeyer, Meindert, MD  Admit date: 04/03/2020  Chief Complaint:  Chief Complaint  Patient presents with  . Emesis  . Shortness of Breath    S-Pt presented on  04/03/2020 with  Chief Complaint  Patient presents with  . Emesis  . Shortness of Breath  .  Patient main complaint in today visit was I want breakfast.  Patient offers no physical complaints.  Patient father was present in the room asked me relevant question about dialysis chair placement.  I discussed with patient father with discharge planning/social worker/team is working on it.  Medications . amLODipine  10 mg Oral Daily  . calcium acetate  1,334 mg Oral TID WC  . docusate sodium  200 mg Oral BID  . dutasteride  0.5 mg Oral Daily  . [START ON 04/13/2020] epoetin (EPOGEN/PROCRIT) injection  10,000 Units Intravenous Q M,W,F-HD  . heparin  5,000 Units Subcutaneous Q8H  . losartan  100 mg Oral Daily  . magnesium oxide  400 mg Oral Daily  . metoprolol tartrate  100 mg Oral BID  . multivitamin  1 tablet Oral QHS  . sodium chloride flush  10 mL Intravenous Q12H  . tamsulosin  0.4 mg Oral Daily  . Vitamin D (Ergocalciferol)  50,000 Units Oral Q7 days         FYB:OFBPZ from the symptoms mentioned above,there are no other symptoms referable to all systems reviewed.  Physical Exam: Vital signs in last 24 hours: Temp:  [97.6 F (36.4 C)-98.9 F (37.2 C)] 98.7 F (37.1 C) (08/15 0353) Pulse Rate:  [76-83] 79 (08/15 0353) Resp:  [16-20] 18 (08/15 0353) BP: (136-179)/(86-110) 150/90 (08/15 0353) SpO2:  [99 %-100 %] 100 % (08/15 0353) Weight:  [114.3 kg] 114.3 kg (08/15 0353) Weight change: -0.499 kg Last BM Date: 04/10/20  Intake/Output from previous day: 08/14 0701 - 08/15 0700 In: 604.7 [P.O.:600; I.V.:4.7] Out: 2225 [Urine:2225] No intake/output data recorded.   Physical Exam: General- pt is awake,alert, oriented to time  place and person Resp- No acute REsp distress, CTA B/L NO Rhonchi CVS- S1S2 regular in rate and rhythm GIT- BS+, soft, NT, ND EXT- NO LE Edema, Cyanosis Access patient has tunneled catheter in situ  Lab Results: CBC Recent Labs    04/11/20 0507 04/12/20 0437  WBC 12.7* 12.1*  HGB 8.7* 8.4*  HCT 27.1* 27.1*  PLT 149* 152    BMET Recent Labs    04/11/20 0507 04/12/20 0437  NA 138 141  K 3.7 3.6  CL 100 103  CO2 28 27  GLUCOSE 89 83  BUN 29* 40*  CREATININE 5.88* 7.11*  CALCIUM 6.6* 6.3*    MICRO Recent Results (from the past 240 hour(s))  SARS Coronavirus 2 by RT PCR (hospital order, performed in [facility] hospital lab) Nasopharyngeal Nasopharyngeal Swab     Status: None   Collection Time: 04/03/20  3:06 AM   Specimen: Nasopharyngeal Swab  Result Value Ref Range Status   SARS Coronavirus 2 NEGATIVE NEGATIVE Final    Comment: (NOTE) SARS-CoV-2 target nucleic acids are NOT DETECTED.  The SARS-CoV-2 RNA is generally detectable in upper and lower respiratory specimens during the acute phase of infection. The lowest concentration of SARS-CoV-2 viral copies this assay can detect is 250 copies / mL. A negative result does not preclude SARS-CoV-2 infection and should not be used as the sole basis for treatment or other patient management decisions.  A negative result may occur with improper specimen collection / handling, submission of specimen other than nasopharyngeal swab, presence of viral mutation(s) within the areas targeted by this assay, and inadequate number of viral copies (<250 copies / mL). A negative result must be combined with clinical observations, patient history, and epidemiological information.  Fact Sheet for Patients:   StrictlyIdeas.no  Fact Sheet for Healthcare Providers: BankingDealers.co.za  This test is not yet approved or  cleared by the [place] FDA and has been authorized for  detection and/or diagnosis of SARS-CoV-2 by FDA under an Emergency Use Authorization (EUA).  This EUA will remain in effect (meaning this test can be used) for the duration of the COVID-19 declaration under Section 564(b)(1) of the Act, 21 U.S.C. section 360bbb-3(b)(1), unless the authorization is terminated or revoked sooner.  Performed at [facility], [address] [place], [place] [address]   Culture, blood (Routine X 2) w Reflex to ID Panel     Status: None   Collection Time: 04/05/20  5:08 PM   Specimen: BLOOD  Result Value Ref Range Status   Specimen Description BLOOD LAC  Final   Special Requests   Final    BOTTLES DRAWN AEROBIC AND ANAEROBIC Blood Culture adequate volume   Culture   Final    NO GROWTH 5 DAYS Performed at [facility], [address] [place], [place] [address]    Report Status 04/10/2020 FINAL  Final  CULTURE, BLOOD (ROUTINE X 2) w Reflex to ID Panel     Status: None   Collection Time: 04/06/20  7:30 AM   Specimen: BLOOD  Result Value Ref Range Status   Specimen Description BLOOD LEFT ARM  Final   Special Requests   Final    BOTTLES DRAWN AEROBIC AND ANAEROBIC Blood Culture results may not be optimal due to an excessive volume of blood received in culture bottles   Culture   Final    NO GROWTH 5 DAYS Performed at [facility], [address] [place], [place] [address]    Report Status 04/11/2020 FINAL  Final  CULTURE, BLOOD (ROUTINE X 2) w Reflex to ID Panel     Status: None   Collection Time: 04/06/20  8:28 AM   Specimen: BLOOD  Result Value Ref Range Status   Specimen Description BLOOD BLOOD RIGHT HAND  Final   Special Requests   Final    BOTTLES DRAWN AEROBIC AND ANAEROBIC Blood Culture adequate volume   Culture   Final    NO GROWTH 5 DAYS Performed at [facility], [address] [place], [place] [address]    Report Status 04/11/2020 FINAL  Final  Urine Culture     Status: Abnormal   Collection  Time: 04/07/20  6:02 AM   Specimen: Urine, Random  Result Value Ref Range Status   Specimen Description   Final    URINE, RANDOM Performed at [facility], [address], [place], [place] [address]    Special Requests   Final    NONE Performed at [facility], [address] [place], [place] [address]    Culture MULTIPLE SPECIES PRESENT, SUGGEST RECOLLECTION (A)  Final   Report Status 04/08/2020 FINAL  Final      Lab Results  Component Value Date   PTH 99 (H) 04/03/2020   CALCIUM 6.3 (LL) 04/12/2020   PHOS 5.3 (H) 04/08/2020               Impression:  Patient is a 49 year old African-American male with a past medical history of learning disability, autism, hypertension, obsessive-compulsive disorder who was admitted to the hospital on April 03, 2020 with chief complaint of Hypocalcemia Enteritis Prolonged QT interval on EKG Acute kidney injury  1)Renal ESRD secondary to obstructive uropathy and hypertension Patient extensive work-up was done which came back negative for hepatitis/HIV/ANCA/SPEP and UPEP/ANA/anti-GBM antibodies/complement levels. Patient renal ultrasound showed bilaterally small kidneys at around 7.3 cm. Patient had first day of dialysis on August 9. Patient had tunnel catheter placement on April 06, 2020. Patient was last dialyzed on Friday No need for renal replacement therapy today   2)HTN Patient was admitted with hypertensive emergency on admission. Patient blood pressure is now much better   3)Anemia of chronic disease  HGb is not at goal (9--11) Patient is on Epogen  4) secondary hyperparathyroidism -CKD Mineral-Bone Disorder   Secondary Hyperparathyroidism present Phosphorus at goal.   5) obstructive uropathy Patient is on tamsulosin and dutasteride Patient has catheter in situ Primary team and urology are following   6) electrolytes    sodium Normonatremic   potassium Normokalemic    7)Acid base Co2 at goal   8) infection prevention  Patient is hep B antigen/antibody-negative Patient is hep C negative Patient is HIV negative Patient QuantiFERON is negative  Results for JOHNOTHAN, BASCOMB (MRN [id number]) as of 04/12/2020 10:58  Ref. Range 04/03/2020 18:09  Hepatitis B Surface Ag Latest Ref Range: NON REACTIVE  NON REACTIVE  Hep B S Ab Latest Ref Range: NON REACTIVE  NON REACTIVE  Hep B Core Ab, IgM Latest Ref Range: NON REACTIVE  NON REACTIVE  Hepatitis C Vrs RNA by PCR-Qual Latest Ref Range: Negative  Negative  HIV Screen 4th Generation wRfx Latest Ref Range: Non Reactive  Non Reactive   Results for JADARIOUS, DOBBINS (MRN [id number]) as of 04/12/2020 10:58  Ref. Range 04/06/2020 05:27  QUANTIFERON-TB GOLD PLUS Unknown Rpt    Plan:   No need for renal placement therapy today We will dialyze patient Monday if patient still inpatient   Vallerie Hentz s [facility] 04/12/2020, 7:26 AM

## 2020-04-12 NOTE — Progress Notes (Signed)
Critical lab for attached patient. Calcium 6.3 Joseph Gilmore in lab called at [phone].Provider notified: Sharion Settler, NP at [phone]. Will anticipate order.

## 2020-04-13 LAB — BASIC METABOLIC PANEL
Anion gap: 13 (ref 5–15)
BUN: 48 mg/dL — ABNORMAL HIGH (ref 6–20)
CO2: 25 mmol/L (ref 22–32)
Calcium: 6.6 mg/dL — ABNORMAL LOW (ref 8.9–10.3)
Chloride: 103 mmol/L (ref 98–111)
Creatinine, Ser: 8.57 mg/dL — ABNORMAL HIGH (ref 0.61–1.24)
GFR calc Af Amer: 8 mL/min — ABNORMAL LOW (ref >60)
GFR calc non Af Amer: 7 mL/min — ABNORMAL LOW (ref >60)
Glucose, Bld: 82 mg/dL (ref 70–99)
Potassium: 3.8 mmol/L (ref 3.5–5.1)
Sodium: 141 mmol/L (ref 135–145)

## 2020-04-13 LAB — CBC
HCT: 27.5 % — ABNORMAL LOW (ref 39.0–52.0)
Hemoglobin: 8.9 g/dL — ABNORMAL LOW (ref 13.0–17.0)
MCH: 28 pg (ref 26.0–34.0)
MCHC: 32.4 g/dL (ref 30.0–36.0)
MCV: 86.5 fL (ref 80.0–100.0)
Platelets: 191 10*3/uL (ref 150–400)
RBC: 3.18 MIL/uL — ABNORMAL LOW (ref 4.22–5.81)
RDW: 13.6 % (ref 11.5–15.5)
WBC: 13 10*3/uL — ABNORMAL HIGH (ref 4.0–10.5)
nRBC: 0 % (ref 0.0–0.2)

## 2020-04-13 MED ORDER — NEPRO/CARBSTEADY PO LIQD
237.0000 mL | Freq: Two times a day (BID) | ORAL | Status: DC
  Administered 2020-04-13 – 2020-04-14 (×2): 237 mL via ORAL

## 2020-04-13 NOTE — Care Management Important Message (Signed)
Important Message  Patient Details  Name: Joseph Gilmore MRN: [id number] Date of Birth: Feb 06, 1971   Medicare Important Message Given:  Yes     Loann Quill 04/13/2020, 11:45 AM

## 2020-04-13 NOTE — Progress Notes (Signed)
[facility], [place] 04/13/20  Subjective:   LOS: 10    HEMODIALYSIS FLOWSHEET:  Blood Flow Rate (mL/min): 300 mL/min Arterial Pressure (mmHg): -70 mmHg Venous Pressure (mmHg): 90 mmHg Transmembrane Pressure (mmHg): 50 mmHg Ultrafiltration Rate (mL/min): 280 mL/min Dialysate Flow Rate (mL/min): 600 ml/min Conductivity: Machine : 14.1 Conductivity: Machine : 14.1 Dialysis Fluid Bolus: Normal Saline Bolus Amount (mL): 250 mL  Denies acute c/o    Objective:  Vital signs in last 24 hours:  Temp:  [98 F (36.7 C)-99.1 F (37.3 C)] 98.2 F (36.8 C) (08/16 0950) Pulse Rate:  [79-85] 81 (08/16 0950) Resp:  [19-22] 22 (08/16 0950) BP: (144-170)/(91-98) 166/96 (08/16 0915) SpO2:  [99 %-100 %] 99 % (08/16 0915) Weight:  [111.1 kg] 111.1 kg (08/16 0419)  Weight change: -3.175 kg Filed Weights   04/11/20 0410 04/12/20 0353 04/13/20 0419  Weight: 114.8 kg 114.3 kg 111.1 kg    Intake/Output:    Intake/Output Summary (Last 24 hours) at 04/13/2020 0958 Last data filed at 04/13/2020 0547 Gross per 24 hour  Intake 596.05 ml  Output 2600 ml  Net -2003.95 ml   Physical Exam: General:  No acute distress, laying in the bed  HEENT  anicteric, moist oral mucous membrane  Pulm/lungs  normal breathing effort, lungs are clear to auscultation  CVS/Heart  regular rhythm, no rub or gallop  Abdomen:   Soft, nontender  Extremities:  No peripheral edema  Neurologic:  Alert, oriented, able to follow commands  Skin:  No acute rashes  Rt IJ Permcath 04/06/20 Dr Lucky Cowboy   Basic Metabolic Panel:  Recent Labs  Lab 04/07/20 0413 04/07/20 0413 04/08/20 0272 04/08/20 5366 04/09/20 4403 04/09/20 4742 04/10/20 0423 04/10/20 0423 04/10/20 5956 04/11/20 0507 04/12/20 0437 04/13/20 0524  NA 139   < > 140   < > 138  --  141  --   --  138 141 141  K 4.0   < > 4.3   < > 3.7  --  3.7  --   --  3.7 3.6 3.8  CL 99   < > 98   < > 97*  --  101  --   --  100 103 103  CO2  25   < > 28   < > 27  --  27  --   --  28 27 25   GLUCOSE 100*   < > 104*   < > 89  --  89  --   --  89 83 82  BUN 50*   < > 46*   < > 36*  --  47*  --   --  29* 40* 48*  CREATININE 10.59*   < > 8.29*   < > 7.05*  --  7.93*  --   --  5.88* 7.11* 8.57*  CALCIUM 5.6*   < > 6.5*   < > 6.6*   < > 6.2*   < >  --  6.6* 6.3* 6.6*  MG 1.6*  --  2.0  --   --   --   --   --  1.9  --   --   --   PHOS  --   --  5.3*  --   --   --   --   --   --   --   --   --    < > = values in this interval not displayed.     CBC: Recent  Labs  Lab 04/07/20 0413 04/08/20 1245 04/09/20 0546 04/10/20 0423 04/11/20 0507 04/12/20 0437 04/13/20 0524  WBC 12.8*   < > 13.2* 11.5* 12.7* 12.1* 13.0*  NEUTROABS 9.6*  --   --   --   --   --   --   HGB 8.9*   < > 8.9* 8.7* 8.7* 8.4* 8.9*  HCT 26.7*   < > 26.7* 28.0* 27.1* 27.1* 27.5*  MCV 85.3   < > 85.3 88.9 86.3 89.1 86.5  PLT 168   < > 147* 142* 149* 152 191   < > = values in this interval not displayed.      Lab Results  Component Value Date   HEPBSAG NON REACTIVE 04/03/2020   HEPBSAB NON REACTIVE 04/03/2020   HEPBIGM NON REACTIVE 04/03/2020      Microbiology:  Recent Results (from the past 240 hour(s))  Culture, blood (Routine X 2) w Reflex to ID Panel     Status: None   Collection Time: 04/05/20  5:08 PM   Specimen: BLOOD  Result Value Ref Range Status   Specimen Description BLOOD LAC  Final   Special Requests   Final    BOTTLES DRAWN AEROBIC AND ANAEROBIC Blood Culture adequate volume   Culture   Final    NO GROWTH 5 DAYS Performed at [facility], [address] [place], [place] [address]    Report Status 04/10/2020 FINAL  Final  CULTURE, BLOOD (ROUTINE X 2) w Reflex to ID Panel     Status: None   Collection Time: 04/06/20  7:30 AM   Specimen: BLOOD  Result Value Ref Range Status   Specimen Description BLOOD LEFT ARM  Final   Special Requests   Final    BOTTLES DRAWN AEROBIC AND ANAEROBIC Blood Culture results may not be optimal  due to an excessive volume of blood received in culture bottles   Culture   Final    NO GROWTH 5 DAYS Performed at [facility], [address] [place], [place] [address]    Report Status 04/11/2020 FINAL  Final  CULTURE, BLOOD (ROUTINE X 2) w Reflex to ID Panel     Status: None   Collection Time: 04/06/20  8:28 AM   Specimen: BLOOD  Result Value Ref Range Status   Specimen Description BLOOD BLOOD RIGHT HAND  Final   Special Requests   Final    BOTTLES DRAWN AEROBIC AND ANAEROBIC Blood Culture adequate volume   Culture   Final    NO GROWTH 5 DAYS Performed at [facility], [address] [place], [place] [address]    Report Status 04/11/2020 FINAL  Final  Urine Culture     Status: Abnormal   Collection Time: 04/07/20  6:02 AM   Specimen: Urine, Random  Result Value Ref Range Status   Specimen Description   Final    URINE, RANDOM Performed at [facility], [address], [place], [place] [address]    Special Requests   Final    NONE Performed at [facility], [address] [place], [place] [address]    Culture MULTIPLE SPECIES PRESENT, SUGGEST RECOLLECTION (A)  Final   Report Status 04/08/2020 FINAL  Final    Coagulation Studies: No results for input(s): LABPROT, INR in the last 72 hours.  Urinalysis: No results for input(s): COLORURINE, LABSPEC, PHURINE, GLUCOSEU, HGBUR, BILIRUBINUR, KETONESUR, PROTEINUR, UROBILINOGEN, NITRITE, LEUKOCYTESUR in the last 72 hours.  Invalid input(s): APPERANCEUR    Imaging:  No results found.   Medications:   . sodium chloride Stopped (04/12/20 1857)  . ampicillin-sulbactam (UNASYN) IV Stopped (04/12/20 1652)   . amLODipine  10 mg Oral Daily  . calcium acetate  1,334 mg Oral TID WC  . docusate sodium  200 mg Oral BID  . dutasteride  0.5 mg Oral Daily  . epoetin (EPOGEN/PROCRIT) injection  10,000 Units Intravenous Q M,W,F-HD  . heparin  5,000 Units Subcutaneous Q8H  . losartan  100 mg  Oral Daily  . magnesium oxide  400 mg Oral Daily  . metoprolol tartrate  100 mg Oral BID  . multivitamin  1 tablet Oral QHS  . sodium chloride flush  10 mL Intravenous Q12H  . tamsulosin  0.4 mg Oral Daily  . Vitamin D (Ergocalciferol)  50,000 Units Oral Q7 days   sodium chloride, acetaminophen **OR** acetaminophen, bisacodyl, hydrALAZINE, HYDROmorphone (DILAUDID) injection, LORazepam, ondansetron (ZOFRAN) IV  Assessment/ Plan:  49 y.o. male with learning disability, autism, hypertension, obsessive-compulsive disorder  was admitted on 04/03/2020 for  Principal Problem:   AKI (acute kidney injury) ([place]) Active Problems:   Metabolic acidosis   Anemia of chronic disease   Elevated LFTs   Elevated lipase   Hypertensive urgency   Vomiting   BPH (benign prostatic hyperplasia)   Fever   Enteritis   Non-traumatic rhabdomyolysis   Weakness  Hypocalcemia [E83.51] Enteritis [K52.9] Prolonged Q-T interval on ECG [R94.31] AKI (acute kidney injury) ([place]) [N17.9] Acute kidney injury ([place]) [N17.9]  #. 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 Patient needs to be able to sit in chair for 4 hours  Can be tried in the room today Reduce UF to 0.5 kg Re assess daily for HD  #. Anemia of CKD  Lab Results  Component Value Date   HGB 8.9 (L) 04/13/2020   Low dose EPO with HD  #. Secondary hyperparathyroidism of renal origin N 25.81      Component Value Date/Time   PTH 99 (H) 04/03/2020 1809   Lab Results  Component Value Date   PHOS 5.3 (H) 04/08/2020   Monitor calcium and phos level during this admission    LOS: [place] 8/16/20219:58 Gilmore, [place]

## 2020-04-13 NOTE — Progress Notes (Signed)
This note also relates to the following rows which could not be included: Pulse Rate - Cannot attach notes to unvalidated device data Resp - Cannot attach notes to unvalidated device data BP - Cannot attach notes to unvalidated device data  Hd completed  

## 2020-04-13 NOTE — Progress Notes (Signed)
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  S/p permcath placed. Continue on HD as per nephro.  Still waiting on HD outpatient spot  Fever: etiology unclear, possible tooth infection as recent of tooth pain & suppose to have dental work done but was canceled for some reason as per pt's father. Will d/c IV unasyn today after doses given. Blood cxs NGTD. CXR neg. CT abd/pelvis scan the other day showed possible enteritis. ID signed off. Resolved  Right lower molar infection: as pt CT scan. Possible etiology of fevers but no fever in over 72 hours. Will d/c IV unasyn after today doses are given  Hypocalcemia: continue on Ca supplements   BPH: continue w/ foley. Continue on flomax and proscar. W/ elevated PSA, urology will f/u as an outpatient  ACD: H&H are labile. No need for a transfusion at this time.   Thrombocytopenia: resolved  Leukocytosis: likely secondary to infection. Labile. Continue on IV abxs    HTN: continue on amlodipine and metoprolol  Weakness: PT recs SNF but pt's father refused. Will go home w/ home health  Autism: continue w/ supportive care     DVT prophylaxis: heparin Code Status: full Family Communication:  Disposition Plan: likely d/c home w/ home health as pt's father refused SNF. Waiting still on outpatient HD spot   Status is: Inpatient  Remains inpatient appropriate because:Unsafe d/c plan. Waiting an outpatient HD spot still    Dispo: The patient is from: Home              Anticipated d/c is to:  Home              Anticipated d/c date is: how many ever days it takes to get an outpatient HD                  spot              Patient currently is not medically stable to d/c.    Consultants:   nephro   Vascular surg  ID   Procedures:   Antimicrobials: unasyn   Subjective: Pt c/o fatigue  Objective: Vitals:   04/12/20 1631 04/12/20 1957 04/12/20 2015 04/13/20 0419  BP: (!) 146/97 (!) 150/91 (!) 144/93 (!) 165/98  Pulse: 80 79 81 79  Resp: 19     Temp: 98.7 F (37.1 C) 98.5 F (36.9 C) 98 F (36.7 C) 99.1 F (37.3 C)  TempSrc: Oral Oral Oral Oral  SpO2: 100% 100% 100% 99%  Weight:    111.1 kg  Height:        Intake/Output Summary (Last 24 hours) at 04/13/2020 0733 Last data filed at 04/13/2020 0547 Gross per 24 hour  Intake 596.05 ml  Output 2600 ml  Net -2003.95 ml   Filed Weights   04/11/20 0410 04/12/20 0353 04/13/20 0419  Weight: 114.8 kg 114.3 kg 111.1 kg    Examination: General exam:Appears calm and comfortable  Respiratory system:clear breath sounds b/l. No rales Cardiovascular system:S1 &S2+. No rubs,  gallops or clicks. Gastrointestinal system:Abdomen is obese, soft and nontender.  Hypoactive bowel sounds heard. Central nervous system:Alert and oriented. Moves all 4 extremities Psychiatry:Judgement and insight appearabnormal. Mood &affect appropriate.      Data Reviewed: I have personally reviewed following labs and imaging studies  CBC: Recent Labs  Lab 04/07/20 0413 04/08/20 0634 04/09/20 0546 04/10/20 0423 04/11/20 0507 04/12/20 0437 04/13/20 0524  WBC 12.8*   < > 13.2* 11.5* 12.7* 12.1* 13.0*  NEUTROABS 9.6*  --   --   --   --   --   --   HGB 8.9*   < > 8.9* 8.7* 8.7* 8.4* 8.9*  HCT 26.7*   < > 26.7* 28.0* 27.1* 27.1* 27.5*  MCV 85.3   < > 85.3 88.9 86.3 89.1 86.5  PLT 168   < > 147* 142* 149* 152 191   < > = values in this interval not displayed.   Basic Metabolic Panel: Recent Labs  Lab 04/07/20 0413  04/07/20 0413 04/08/20 8099 04/08/20 8338 04/09/20 0546 04/10/20 0423 04/10/20 2505 04/11/20 0507 04/12/20 0437 04/13/20 0524  NA 139   < > 140   < > 138 141  --  138 141 141  K 4.0   < > 4.3   < > 3.7 3.7  --  3.7 3.6 3.8  CL 99   < > 98   < > 97* 101  --  100 103 103  CO2 25   < > 28   < > 27 27  --  28 27 25   GLUCOSE 100*   < > 104*   < > 89 89  --  89 83 82  BUN 50*   < > 46*   < > 36* 47*  --  29* 40* 48*  CREATININE 10.59*   < > 8.29*   < > 7.05* 7.93*  --  5.88* 7.11* 8.57*  CALCIUM 5.6*   < > 6.5*   < > 6.6* 6.2*  --  6.6* 6.3* 6.6*  MG 1.6*  --  2.0  --   --   --  1.9  --   --   --   PHOS  --   --  5.3*  --   --   --   --   --   --   --    < > = values in this interval not displayed.   GFR: Estimated Creatinine Clearance: 12.7 mL/min (A) (by C-G formula based on SCr of 8.57 mg/dL (H)). Liver Function Tests: Recent Labs  Lab 04/07/20 0413 04/08/20 0634 04/12/20 0437  AST 31 32  --   ALT 29 26  --   ALKPHOS 44 55  --   BILITOT 0.9 0.8  --   PROT 6.2* 7.0  --   ALBUMIN 3.1* 3.3* 2.8*   No results for input(s): LIPASE, AMYLASE in the last 168 hours. No results for input(s): AMMONIA in the last 168 hours. Coagulation Profile: No results for input(s): INR, PROTIME in the last 168 hours. Cardiac Enzymes: Recent Labs  Lab 04/08/20 0634  CKTOTAL 1,168*   BNP (last 3 results) No results for input(s): PROBNP in the last 8760 hours. HbA1C: No results for input(s): HGBA1C in the last 72 hours. CBG: No results for input(s): GLUCAP in the last 168 hours. Lipid Profile: No results for input(s): CHOL, HDL, LDLCALC, TRIG, CHOLHDL, LDLDIRECT in the last 72 hours. Thyroid Function Tests: No results for input(s): TSH, T4TOTAL, FREET4,  T3FREE, THYROIDAB in the last 72 hours. Anemia Panel: No results for input(s): VITAMINB12, FOLATE, FERRITIN, TIBC, IRON, RETICCTPCT in the last 72 hours. Sepsis Labs: Recent Labs  Lab 04/07/20 0413 04/07/20 0717 04/08/20 0634  04/09/20 0546  PROCALCITON 0.77  --  0.98 0.90  LATICACIDVEN 0.8 0.8  --   --     Recent Results (from the past 240 hour(s))  Culture, blood (Routine X 2) w Reflex to ID Panel     Status: None   Collection Time: 04/05/20  5:08 PM   Specimen: BLOOD  Result Value Ref Range Status   Specimen Description BLOOD LAC  Final   Special Requests   Final    BOTTLES DRAWN AEROBIC AND ANAEROBIC Blood Culture adequate volume   Culture   Final    NO GROWTH 5 DAYS Performed at [facility], [address] [place], [place] [address]    Report Status 04/10/2020 FINAL  Final  CULTURE, BLOOD (ROUTINE X 2) w Reflex to ID Panel     Status: None   Collection Time: 04/06/20  7:30 AM   Specimen: BLOOD  Result Value Ref Range Status   Specimen Description BLOOD LEFT ARM  Final   Special Requests   Final    BOTTLES DRAWN AEROBIC AND ANAEROBIC Blood Culture results may not be optimal due to an excessive volume of blood received in culture bottles   Culture   Final    NO GROWTH 5 DAYS Performed at [facility], [address] [place], [place] [address]    Report Status 04/11/2020 FINAL  Final  CULTURE, BLOOD (ROUTINE X 2) w Reflex to ID Panel     Status: None   Collection Time: 04/06/20  8:28 AM   Specimen: BLOOD  Result Value Ref Range Status   Specimen Description BLOOD BLOOD RIGHT HAND  Final   Special Requests   Final    BOTTLES DRAWN AEROBIC AND ANAEROBIC Blood Culture adequate volume   Culture   Final    NO GROWTH 5 DAYS Performed at [facility], [address], [place], [place] [address]    Report Status 04/11/2020 FINAL  Final  Urine Culture     Status: Abnormal   Collection Time: 04/07/20  6:02 AM   Specimen: Urine, Random  Result Value Ref Range Status   Specimen Description   Final    URINE, RANDOM Performed at [facility], [address], [place], [place] [address]    Special Requests   Final    NONE Performed at [facility],  [address] [place], [place] [address]    Culture MULTIPLE SPECIES PRESENT, SUGGEST RECOLLECTION (A)  Final   Report Status 04/08/2020 FINAL  Final         Radiology Studies: No results found.      Scheduled Meds: . amLODipine  10 mg Oral Daily  . calcium acetate  1,334 mg Oral TID WC  . docusate sodium  200 mg Oral BID  . dutasteride  0.5 mg Oral Daily  . epoetin (EPOGEN/PROCRIT) injection  10,000 Units Intravenous Q M,W,F-HD  . heparin  5,000 Units Subcutaneous Q8H  . losartan  100 mg Oral Daily  . magnesium oxide  400 mg Oral Daily  . metoprolol tartrate  100 mg Oral BID  . multivitamin  1 tablet Oral QHS  . sodium chloride flush  10 mL Intravenous Q12H  . tamsulosin  0.4 mg Oral Daily  . Vitamin D (Ergocalciferol)  50,000 Units Oral Q7 days   Continuous Infusions: . sodium chloride Stopped (04/12/20 1857)  . ampicillin-sulbactam (UNASYN) IV Stopped (04/12/20 1652)     LOS: 10 days    Time spent: 32 mins    Wyvonnia Dusky, MD [facility] Pager [phone]  If 7PM-7AM, please contact night-coverage [email or web address] 04/13/2020, 7:33 AM

## 2020-04-13 NOTE — Progress Notes (Signed)
Nutrition Brief Note  Patient identified for LOS  49 year old African-American male with a past medical history of learning disability, autism, hypertension, obsessive-compulsive disorder who was admitted to the hospital on April 03, 2020 with chief complaint of Hypocalcemia, AKI and HTN now s/p perm cath placement and new HD 8/9.  Wt Readings from Last 15 Encounters:  04/13/20 111.1 kg    Body mass index is 37.25 kg/m. Patient meets criteria for obesity based on current BMI.   Current diet order is renal, patient is consuming approximately 75-100% of meals at this time. Labs and medications reviewed.   Pt already ordered for rena-vite. RD will add Nepro Shake po BID to help replace losses from HD.  No further nutrition interventions warranted at this time. If nutrition issues arise, please consult RD.   Koleen Distance MS, RD, LDN Please refer to [facility] for RD and/or RD on-call/weekend/after hours pager

## 2020-04-13 NOTE — Progress Notes (Signed)
This note also relates to the following rows which could not be included: Pulse Rate - Cannot attach notes to unvalidated device data Resp - Cannot attach notes to unvalidated device data  Hd started  

## 2020-04-14 MED ORDER — VITAMIN D (ERGOCALCIFEROL) 1.25 MG (50000 UNIT) PO CAPS: 50000.0000 [IU] | ORAL_CAPSULE | ORAL | 0 refills | Status: AC

## 2020-04-14 MED ORDER — LOSARTAN POTASSIUM 100 MG PO TABS: 100.0000 mg | ORAL_TABLET | Freq: Every day | ORAL | 0 refills | Status: DC

## 2020-04-14 MED ORDER — AMLODIPINE BESYLATE 10 MG PO TABS: 10.0000 mg | ORAL_TABLET | Freq: Every day | ORAL | 0 refills | Status: DC

## 2020-04-14 MED ORDER — DUTASTERIDE 0.5 MG PO CAPS: 0.5000 mg | ORAL_CAPSULE | Freq: Every day | ORAL | 0 refills | Status: AC

## 2020-04-14 MED ORDER — METOPROLOL TARTRATE 100 MG PO TABS: 100.0000 mg | ORAL_TABLET | Freq: Two times a day (BID) | ORAL | 0 refills | Status: DC

## 2020-04-14 MED ORDER — CALCIUM ACETATE (PHOS BINDER) 667 MG PO CAPS: 1334.0000 mg | ORAL_CAPSULE | Freq: Three times a day (TID) | ORAL | 0 refills | Status: AC

## 2020-04-14 MED ORDER — TAMSULOSIN HCL 0.4 MG PO CAPS: 0.4000 mg | ORAL_CAPSULE | Freq: Every day | ORAL | 0 refills | Status: AC

## 2020-04-14 MED ORDER — RENA-VITE PO TABS: 1.0000 | ORAL_TABLET | Freq: Every day | ORAL | 0 refills | Status: AC

## 2020-04-14 NOTE — Plan of Care (Signed)

## 2020-04-14 NOTE — Progress Notes (Signed)
Discharge instructions given to pt's father. All questions answered. IV taken out and tele monitor off. Urinary catheter changed to leg bag and educated on how to empty. Urology office closed for lunch so pt's father to call to make follow up appointment. Also educated on how to keep blisters on right hand/arm clean.

## 2020-04-14 NOTE — TOC Progression Note (Signed)
Transition of Care [facility]) - Progression Note    Patient Details  Name: Joseph Gilmore MRN: [id number] Date of Birth: May 03, 1971  Transition of Care [facility]) CM/SW Contact  Eileen Stanford, LCSW Phone Number: 04/14/2020, 9:20 AM  Clinical Narrative:  Pt HD center has been established however won't start there until Thursday. Pt can d/c today after HD if medically stable. MD notified.     Expected Discharge Plan: [place] Barriers to Discharge: Continued Medical Work up  Expected Discharge Plan and Services Expected Discharge Plan: [place]   Discharge Planning Services: CM Consult   Living arrangements for the past 2 months: Single Family Home                           HH Arranged: RN, PT, OT [facility] Agency: [place] ([facility]) Date [place]: 04/09/20 Time [place]: 19 Representative spoke with at [place]: [place] ([place]) Interventions    Readmission Risk Interventions No flowsheet data found.

## 2020-04-14 NOTE — Progress Notes (Signed)
New hemodialysis patient accepted at [facility] 12:10pm. Start date is 8/19 at 11:20am. Patient family is aware. Please contact me with any other dialysis placement concerns.  Elvera Bicker Dialysis Coordinator [phone]

## 2020-04-14 NOTE — Progress Notes (Signed)
[facility], [place] 04/14/20  Subjective:   LOS: 11    Denies acute c/o  States he was able to eat without nausea or vomiting No leg edema    Objective:  Vital signs in last 24 hours:  Temp:  [98.2 F (36.8 C)-100.2 F (37.9 C)] 98.8 F (37.1 C) (08/17 1213) Pulse Rate:  [72-90] 72 (08/17 1213) Resp:  [17-23] 18 (08/17 1213) BP: (140-176)/(86-107) 140/89 (08/17 1213) SpO2:  [99 %-100 %] 99 % (08/17 1213)  Weight change:  Filed Weights   04/11/20 0410 04/12/20 0353 04/13/20 0419  Weight: 114.8 kg 114.3 kg 111.1 kg    Intake/Output:    Intake/Output Summary (Last 24 hours) at 04/14/2020 1316 Last data filed at 04/14/2020 0950 Gross per 24 hour  Intake 840 ml  Output 2300 ml  Net -1460 ml   Physical Exam: General:  No acute distress, laying in the bed  HEENT  anicteric, moist oral mucous membrane  Pulm/lungs  normal breathing effort, lungs are clear to auscultation  CVS/Heart  regular rhythm, no rub or gallop  Abdomen:   Soft, nontender  Extremities:  No peripheral edema  Neurologic:  Alert, oriented, able to follow commands  Skin:  No acute rashes  Rt IJ Permcath 04/06/20 Dr Lucky Cowboy   Basic Metabolic Panel:  Recent Labs  Lab 04/08/20 5093 04/08/20 2671 04/09/20 0546 04/09/20 0546 04/10/20 0423 04/10/20 0423 04/10/20 2458 04/11/20 0507 04/12/20 0437 04/13/20 0524  NA 140   < > 138  --  141  --   --  138 141 141  K 4.3   < > 3.7  --  3.7  --   --  3.7 3.6 3.8  CL 98   < > 97*  --  101  --   --  100 103 103  CO2 28   < > 27  --  27  --   --  28 27 25   GLUCOSE 104*   < > 89  --  89  --   --  89 83 82  BUN 46*   < > 36*  --  47*  --   --  29* 40* 48*  CREATININE 8.29*   < > 7.05*  --  7.93*  --   --  5.88* 7.11* 8.57*  CALCIUM 6.5*   < > 6.6*   < > 6.2*   < >  --  6.6* 6.3* 6.6*  MG 2.0  --   --   --   --   --  1.9  --   --   --   PHOS 5.3*  --   --   --   --   --   --   --   --   --    < > = values in this interval not  displayed.     CBC: Recent Labs  Lab 04/09/20 0546 04/10/20 0423 04/11/20 0507 04/12/20 0437 04/13/20 0524  WBC 13.2* 11.5* 12.7* 12.1* 13.0*  HGB 8.9* 8.7* 8.7* 8.4* 8.9*  HCT 26.7* 28.0* 27.1* 27.1* 27.5*  MCV 85.3 88.9 86.3 89.1 86.5  PLT 147* 142* 149* 152 191      Lab Results  Component Value Date   HEPBSAG NON REACTIVE 04/03/2020   HEPBSAB NON REACTIVE 04/03/2020   HEPBIGM NON REACTIVE 04/03/2020      Microbiology:  Recent Results (from the past 240 hour(s))  Culture, blood (Routine X 2) w Reflex to ID Panel  Status: None   Collection Time: 04/05/20  5:08 PM   Specimen: BLOOD  Result Value Ref Range Status   Specimen Description BLOOD LAC  Final   Special Requests   Final    BOTTLES DRAWN AEROBIC AND ANAEROBIC Blood Culture adequate volume   Culture   Final    NO GROWTH 5 DAYS Performed at [facility], [address] [place], [place] [address]    Report Status 04/10/2020 FINAL  Final  CULTURE, BLOOD (ROUTINE X 2) w Reflex to ID Panel     Status: None   Collection Time: 04/06/20  7:30 AM   Specimen: BLOOD  Result Value Ref Range Status   Specimen Description BLOOD LEFT ARM  Final   Special Requests   Final    BOTTLES DRAWN AEROBIC AND ANAEROBIC Blood Culture results may not be optimal due to an excessive volume of blood received in culture bottles   Culture   Final    NO GROWTH 5 DAYS Performed at [facility], [address] [place], [place] [address]    Report Status 04/11/2020 FINAL  Final  CULTURE, BLOOD (ROUTINE X 2) w Reflex to ID Panel     Status: None   Collection Time: 04/06/20  8:28 AM   Specimen: BLOOD  Result Value Ref Range Status   Specimen Description BLOOD BLOOD RIGHT HAND  Final   Special Requests   Final    BOTTLES DRAWN AEROBIC AND ANAEROBIC Blood Culture adequate volume   Culture   Final    NO GROWTH 5 DAYS Performed at [facility], [address], [place], [place] [address]    Report  Status 04/11/2020 FINAL  Final  Urine Culture     Status: Abnormal   Collection Time: 04/07/20  6:02 AM   Specimen: Urine, Random  Result Value Ref Range Status   Specimen Description   Final    URINE, RANDOM Performed at [facility], [address] [place], [place] [address]    Special Requests   Final    NONE Performed at [facility], [address] [place],  [address]    Culture MULTIPLE SPECIES PRESENT, SUGGEST RECOLLECTION (A)  Final   Report Status 04/08/2020 FINAL  Final    Coagulation Studies: No results for input(s): LABPROT, INR in the last 72 hours.  Urinalysis: No results for input(s): COLORURINE, LABSPEC, PHURINE, GLUCOSEU, HGBUR, BILIRUBINUR, KETONESUR, PROTEINUR, UROBILINOGEN, NITRITE, LEUKOCYTESUR in the last 72 hours.  Invalid input(s): APPERANCEUR    Imaging: No results found.   Medications:   . sodium chloride Stopped (04/12/20 1857)   . amLODipine  10 mg Oral Daily  . calcium acetate  1,334 mg Oral TID WC  . docusate sodium  200 mg Oral BID  . dutasteride  0.5 mg Oral Daily  . epoetin (EPOGEN/PROCRIT) injection  10,000 Units Intravenous Q M,W,F-HD  . feeding supplement (NEPRO CARB STEADY)  237 mL Oral BID BM  . heparin  5,000 Units Subcutaneous Q8H  . losartan  100 mg Oral Daily  . magnesium oxide  400 mg Oral Daily  . metoprolol tartrate  100 mg Oral BID  . multivitamin  1 tablet Oral QHS  . sodium chloride flush  10 mL Intravenous Q12H  . tamsulosin  0.4 mg Oral Daily  . Vitamin D (Ergocalciferol)  50,000 Units Oral Q7 days   sodium chloride, acetaminophen **OR** acetaminophen, bisacodyl, hydrALAZINE, HYDROmorphone (DILAUDID) injection, LORazepam, ondansetron (ZOFRAN) IV  Assessment/ Plan:  49 y.o.  male with learning disability, autism, hypertension, obsessive-compulsive disorder  was admitted on 04/03/2020 for  Principal Problem:   AKI (acute kidney injury) ([place]) Active Problems:   Metabolic acidosis    Anemia of chronic disease   Elevated LFTs   Elevated lipase   Hypertensive urgency   Vomiting   BPH (benign prostatic hyperplasia)   Fever   Enteritis   Non-traumatic rhabdomyolysis   Weakness  Hypocalcemia [E83.51] Enteritis [K52.9] Prolonged Q-T interval on ECG [R94.31] AKI (acute kidney injury) ([place]) [N17.9] Acute kidney injury ([place]) [N17.9]  #. 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  [facility] on [facility] schedule Patient is expected to be discharged today.  Follow-up as outpatient  #. Anemia of CKD  Lab Results  Component Value Date   HGB 8.9 (L) 04/13/2020   Low dose EPO with HD  #. Secondary hyperparathyroidism of renal origin N 25.81      Component Value Date/Time   PTH 99 (H) 04/03/2020 1809   Lab Results  Component Value Date   PHOS 5.3 (H) 04/08/2020   Monitor calcium and phos level during this admission    LOS: [place] 8/17/20211:16 [place], [place]

## 2020-04-14 NOTE — Discharge Summary (Signed)
Physician Discharge Summary  ANH MANGANO JHE:[id number] DOB: 11/22/1970 DOA: 04/03/2020  PCP: Lorelee Market, MD  Admit date: 04/03/2020 Discharge date: 04/14/2020  Admitted From: home Disposition: home w/ home health   Recommendations for Outpatient Follow-up:  1. Follow up with PCP in 1-2 weeks 2. F/u nephro in 1 week  3. F/u w/ urology in 1 week   Home Health: yes Equipment/Devices:  Discharge Condition: stable CODE STATUS: full  Diet recommendation: Heart Healthy   Brief/Interim Summary: HPI was taken from Dr. Florina Ou: Ephraim Hamburger is a 49 y.o. male with medical history significant for autism brought in by his father with a 2-week history of intermittent vomiting and excessive salivation.  History limited as patient has autism and unable to contact father by phone, so history compiled from ER records and speaking with ER provider.  Patient apparently has not seen a primary care provider in several years. ED Course: On arrival, blood pressure elevated at 221/136, with low-grade temperature of 99.5 and otherwise normal vitals.  Blood work notable for creatinine of 14.88 with GFR below 3, bicarb 14, potassium 3.9, calcium 4.1, mildly elevated AST 51 ALT 69, lipase 90.  Hemoglobin 10.1.  Mild troponin elevation of 38/43.  Chest x-ray with no acute findings.  CT abdomen and pelvis showing enlarged prostate with thickened bladder wall consistent with bladder outlet obstruction.  Patient treated with calcium gluconate.  Hospitalist consulted for admission.  Hospital Course from Dr. 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 Pt tolerated HD fairly well. Furthermore, pt was to have a fever likely secondary to right lower molar infection and was treated w/ a course of IV abxs as per ID. The course of abxs was completed inpatient. PT/OT saw the pt and recommend SNF but pt's father refused but agreed to home health. Home health  was set up by CM prior to d/c.   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 S/p permcath placed. Continue on HD as per nephro.  Finalized an outpatient HD spot as per nephro   Fever: etiology unclear, possible tooth infection as recent of tooth pain & suppose to have dental work done but was canceled for some reason as per pt's father. Will d/c IV unasyn today after doses given.Blood cxs NGTD. CXR neg. CT abd/pelvis scan the other day showed possible enteritis. ID signed off. Resolved  Right lower molar infection: as pt CT scan. Possible etiology of fevers but no fever in over 72 hours. Completed course of abxs. Will f/u outpatient w/ a dentist   Hypocalcemia: continue on Ca supplements   BPH: continue w/ foley. Continue on flomax and proscar. W/ elevated PSA, urology will f/u as an outpatient  ACD: H&H are labile. No need for a transfusion at this time.   Thrombocytopenia: resolved  Leukocytosis: likely secondary to infection. Labile. Continue on IV abxs    HTN: continue on amlodipine and metoprolol  Weakness: PT recs SNF but pt's father refused. Will go home w/ home health  Autism: continue w/ supportive care   Discharge Instructions  Discharge Instructions  Diet - low sodium heart healthy   Complete by: As directed    Discharge instructions   Complete by: As directed    F/u w/ PCP in 2 weeks. F/u nephro. Dr. Juleen China, in 1 week   Discharge wound care:   Complete by: As directed    Wound care as stated above   Increase activity slowly   Complete by: As directed      Allergies as of 04/14/2020      Reactions    Chlorhexidine       Medication List    TAKE these medications   amLODipine 10 MG tablet Commonly known as: NORVASC Take 1 tablet (10 mg total) by mouth daily. Start taking on: April 15, 2020   calcium acetate 667 MG capsule Commonly known as: PHOSLO Take 2 capsules (1,334 mg total) by mouth 3 (three) times daily with meals.   dutasteride 0.5 MG capsule Commonly known as: AVODART Take 1 capsule (0.5 mg total) by mouth daily. Start taking on: April 15, 2020   losartan 100 MG tablet Commonly known as: COZAAR Take 1 tablet (100 mg total) by mouth daily. Start taking on: April 15, 2020   metoprolol tartrate 100 MG tablet Commonly known as: LOPRESSOR Take 1 tablet (100 mg total) by mouth 2 (two) times daily.   multivitamin Tabs tablet Take 1 tablet by mouth at bedtime.   tamsulosin 0.4 MG Caps capsule Commonly known as: FLOMAX Take 1 capsule (0.4 mg total) by mouth daily. Start taking on: April 15, 2020   Vitamin D (Ergocalciferol) 1.25 MG (50000 UNIT) Caps capsule Commonly known as: DRISDOL Take 1 capsule (50,000 Units total) by mouth every 7 (seven) days. Start taking on: April 17, 2020            Discharge Care Instructions  (From admission, onward)         Start     Ordered   04/14/20 0000  Discharge wound care:       Comments: Wound care as stated above   04/14/20 1006          Follow-up Information    [facility] Follow [facility].   Specialty: Home Health Services Why: Physical Therapy, Occupational Therapy, Nurse       Lavonia Dana, MD On 04/30/2020.   Specialty: Nephrology Why: Appointment at 8:20am Please go to the [facility]. Contact information: [address] [place] [address] [phone]        Lorelee Market, MD On 04/24/2020.   Specialty: Family Medicine Why: Appointment at 10:30am Contact information: [place] [address] [phone]               Allergies  Allergen Reactions   Chlorhexidine     Consultations: nephro ID  Procedures/Studies: CT ABDOMEN PELVIS WO CONTRAST  Result Date: 04/03/2020 CLINICAL DATA:  Nausea, vomiting, and shortness of breath EXAM: CT ABDOMEN AND PELVIS WITHOUT CONTRAST TECHNIQUE: Multidetector CT imaging of the abdomen and pelvis was performed following the standard protocol without IV contrast. COMPARISON:  None. FINDINGS: Lower chest: Motion artifact limits evaluation. Lung bases appear clear grossly. Hepatobiliary: No focal liver abnormality is seen. No gallstones, gallbladder wall thickening, or biliary dilatation. Pancreas: Unremarkable. No pancreatic ductal dilatation or surrounding inflammatory changes. Spleen: Normal in size without focal abnormality. Adrenals/Urinary Tract: Adrenal glands are unremarkable. Kidneys are normal, without renal calculi, focal lesion, or hydronephrosis. Bladder wall is diffusely thickened, likely due to hypertrophy from outlet obstruction. Stomach/Bowel:  Stomach, small bowel, and colon are not abnormally distended. Diffusely fluid-filled small bowel without wall thickening. Scattered stool in the colon. Colonic diverticula. No wall thickening or inflammatory changes. Vascular/Lymphatic: No significant vascular findings are present. No enlarged abdominal or pelvic lymph nodes. Reproductive: Prostate gland is enlarged, measuring 5.3 cm diameter. Other: No free air or free fluid in the abdomen. Abdominal wall musculature appears intact. Musculoskeletal: No acute or significant osseous findings. IMPRESSION: 1. Fluid-filled nondistended small bowel may indicate enteritis. No evidence of obstruction. 2. Enlarged prostate gland. 3. Diffuse bladder wall thickening likely due to hypertrophy from outlet obstruction. Electronically Signed   By: Lucienne Capers M.D.   On: 04/03/2020 04:00   DG Chest 2 View  Result Date: 04/06/2020 CLINICAL DATA:  New onset fever EXAM: CHEST - 2 VIEW  COMPARISON:  04/02/2020 FINDINGS: Mild cardiomegaly. Low volume examination. Both lungs are clear. The visualized skeletal structures are unremarkable. IMPRESSION: Mild cardiomegaly. No acute abnormality of the lungs in low volume examination. No focal airspace opacity. Electronically Signed   By: Eddie Candle M.D.   On: 04/06/2020 08:01   DG Chest 2 View  Result Date: 04/02/2020 CLINICAL DATA:  Shortness of breath EXAM: CHEST - 2 VIEW COMPARISON:  None. FINDINGS: The heart size and mediastinal contours are within normal limits. Both lungs are clear. The visualized skeletal structures are unremarkable. IMPRESSION: No active cardiopulmonary disease. Electronically Signed   By: Donavan Foil M.D.   On: 04/02/2020 23:11   US RENAL  Result Date: 04/04/2020 CLINICAL DATA:  Acute kidney injury. EXAM: RENAL / URINARY TRACT ULTRASOUND COMPLETE COMPARISON:  CT of the abdomen and pelvis on 04/03/2020 FINDINGS: Right Kidney: Renal measurements: 7.3 x 3.8 x 3.8 centimeters = volume: 56 mL. Renal parenchyma is hyperechoic. No focal renal mass. No hydronephrosis. Left Kidney: Renal measurements: 7.8 x 5.7 x 3.9 centimeters = volume: 92 mL. Renal parenchyma is hyperechoic. No focal renal mass or hydronephrosis. Bladder: Foley catheter decompresses the urinary bladder. Bladder wall thickening identified, with decompressed urinary bladder. Bladder wall measures approximately 1.1 centimeters. Other: None. IMPRESSION: 1. Hyperechoic renal parenchyma and small kidneys bilaterally, consistent with chronic renal disease. 2. No focal renal mass or hydronephrosis. 3. Foley catheter decompresses the urinary bladder. 4. Bladder wall thickening, a nonspecific finding. This can be seen in the setting of urinary tract infection and/or urinary bladder outlet obstruction. Electronically Signed   By: Nolon Nations M.D.   On: 04/04/2020 10:10   PERIPHERAL VASCULAR CATHETERIZATION  Result Date: 04/06/2020 See op note  US Venous Img  Lower Bilateral (DVT)  Result Date: 04/08/2020 CLINICAL DATA:  49 year old male with a history fever EXAM: BILATERAL LOWER EXTREMITY VENOUS DOPPLER ULTRASOUND TECHNIQUE: Gray-scale sonography with graded compression, as well as color Doppler and duplex ultrasound were performed to evaluate the lower extremity deep venous systems from the level of the common femoral vein and including the common femoral, femoral, profunda femoral, popliteal and calf veins including the posterior tibial, peroneal and gastrocnemius veins when visible. The superficial great saphenous vein was also interrogated. Spectral Doppler was utilized to evaluate flow at rest and with distal augmentation maneuvers in the common femoral, femoral and popliteal veins. COMPARISON:  None. FINDINGS: RIGHT LOWER EXTREMITY Common Femoral Vein: No evidence of thrombus. Normal compressibility, respiratory phasicity and response to augmentation. Saphenofemoral Junction: No evidence of thrombus. Normal compressibility and flow on color Doppler imaging. Profunda Femoral Vein: No evidence of thrombus. Normal compressibility and flow on color Doppler imaging. Femoral Vein: No evidence  of thrombus. Normal compressibility, respiratory phasicity and response to augmentation. Popliteal Vein: No evidence of thrombus. Normal compressibility, respiratory phasicity and response to augmentation. Calf Veins: No evidence of thrombus. Normal compressibility and flow on color Doppler imaging. Superficial Great Saphenous Vein: No evidence of thrombus. Normal compressibility and flow on color Doppler imaging. Other Findings:  None. LEFT LOWER EXTREMITY Common Femoral Vein: No evidence of thrombus. Normal compressibility, respiratory phasicity and response to augmentation. Saphenofemoral Junction: No evidence of thrombus. Normal compressibility and flow on color Doppler imaging. Profunda Femoral Vein: No evidence of thrombus. Normal compressibility and flow on color Doppler  imaging. Femoral Vein: No evidence of thrombus. Normal compressibility, respiratory phasicity and response to augmentation. Popliteal Vein: No evidence of thrombus. Normal compressibility, respiratory phasicity and response to augmentation. Calf Veins: No evidence of thrombus. Normal compressibility and flow on color Doppler imaging. Superficial Great Saphenous Vein: No evidence of thrombus. Normal compressibility and flow on color Doppler imaging. Other Findings:  None. IMPRESSION: Sonographic survey of the bilateral lower extremities negative for DVT Electronically Signed   By: Corrie Mckusick D.O.   On: 04/08/2020 10:04   ECHOCARDIOGRAM COMPLETE  Result Date: 04/06/2020    ECHOCARDIOGRAM REPORT   Patient Name:   REECE FEHNEL Date of Exam: 04/06/2020 Medical Rec #:  [id number]     Height:       68.0 in Accession #:    [id number]    Weight:       247.1 lb Date of Birth:  07-27-1971     BSA:          2.236 m Patient Age:    49 years      BP:           171/90 mmHg Patient Gender: M             HR:           100 bpm. Exam Location:  [facility] Procedure: 2D Echo, Cardiac Doppler and Color Doppler Indications:     HTN  History:         Patient has no prior history of Echocardiogram examinations.                  Signs/Symptoms:Murmur.  Sonographer:     Sherrie Sport RDCS (AE) Referring Phys:  [place] Diagnosing Phys: Serafina Royals MD IMPRESSIONS  1. Left ventricular ejection fraction, by estimation, is 60 to 65%. The left ventricle has normal function. The left ventricle has no regional wall motion abnormalities. Left ventricular diastolic parameters were normal.  2. Right ventricular systolic function is normal. The right ventricular size is normal. There is normal pulmonary artery systolic pressure.  3. The mitral valve is normal in structure. Trivial mitral valve regurgitation.  4. The aortic valve is normal in structure. Aortic valve regurgitation is not visualized. FINDINGS  Left Ventricle: Left ventricular  ejection fraction, by estimation, is 60 to 65%. The left ventricle has normal function. The left ventricle has no regional wall motion abnormalities. The left ventricular internal cavity size was normal in size. There is  no left ventricular hypertrophy. Left ventricular diastolic parameters were normal. Right Ventricle: The right ventricular size is normal. No increase in right ventricular wall thickness. Right ventricular systolic function is normal. There is normal pulmonary artery systolic pressure. The tricuspid regurgitant velocity is 1.51 m/s, and  with an assumed right atrial pressure of 10 mmHg, the estimated right ventricular systolic pressure is 36.1 mmHg. Left Atrium: Left atrial  size was normal in size. Right Atrium: Right atrial size was normal in size. Pericardium: There is no evidence of pericardial effusion. Mitral Valve: The mitral valve is normal in structure. Trivial mitral valve regurgitation. Tricuspid Valve: The tricuspid valve is normal in structure. Tricuspid valve regurgitation is trivial. Aortic Valve: The aortic valve is normal in structure. Aortic valve regurgitation is not visualized. Aortic valve mean gradient measures 6.3 mmHg. Aortic valve peak gradient measures 9.6 mmHg. Aortic valve area, by VTI measures 3.22 cm. Pulmonic Valve: The pulmonic valve was normal in structure. Pulmonic valve regurgitation is not visualized. Aorta: The aortic root and ascending aorta are structurally normal, with no evidence of dilitation. IAS/Shunts: No atrial level shunt detected by color flow Doppler.  LEFT VENTRICLE PLAX 2D LVIDd:         4.74 cm  Diastology LVIDs:         2.65 cm  LV e' lateral:   6.64 cm/s LV PW:         1.86 cm  LV E/e' lateral: 17.6 LV IVS:        1.49 cm  LV e' medial:    6.85 cm/s LVOT diam:     2.00 cm  LV E/e' medial:  17.1 LV SV:         81 LV SV Index:   36 LVOT Area:     3.14 cm  RIGHT VENTRICLE RV Basal diam:  3.30 cm RV S prime:     16.90 cm/s TAPSE (M-mode): 4.4 cm  LEFT ATRIUM             Index       RIGHT ATRIUM           Index LA diam:        3.00 cm 1.34 cm/m  RA Area:     21.20 cm LA Vol (A2C):   86.5 ml 38.69 ml/m RA Volume:   67.90 ml  30.37 ml/m LA Vol (A4C):   75.5 ml 33.77 ml/m LA Biplane Vol: 82.8 ml 37.03 ml/m  AORTIC VALVE                    PULMONIC VALVE AV Area (Vmax):    2.55 cm     PV Vmax:        1.00 m/s AV Area (Vmean):   2.42 cm     PV Peak grad:   4.0 mmHg AV Area (VTI):     3.22 cm     RVOT Peak grad: 9 mmHg AV Vmax:           155.00 cm/s AV Vmean:          113.800 cm/s AV VTI:            0.251 m AV Peak Grad:      9.6 mmHg AV Mean Grad:      6.3 mmHg LVOT Vmax:         126.00 cm/s LVOT Vmean:        87.600 cm/s LVOT VTI:          0.257 m LVOT/AV VTI ratio: 1.03  AORTA Ao Root diam: 2.80 cm MITRAL VALVE                TRICUSPID VALVE MV Area (PHT): 2.77 cm     TR Peak grad:   9.1 mmHg MV Decel Time: 274 msec     TR Vmax:        151.00 cm/s MV  E velocity: 117.00 cm/s MV A velocity: 100.00 cm/s  SHUNTS MV E/A ratio:  1.17         Systemic VTI:  0.26 m                             Systemic Diam: 2.00 cm Serafina Royals MD Electronically signed by Serafina Royals MD Signature Date/Time: 04/06/2020/3:38:35 PM    Final    CT MAXILLOFACIAL WO CONTRAST  Result Date: 04/08/2020 CLINICAL DATA:  Rule out tooth abscess. Tooth pain. Dialysis patient. EXAM: CT MAXILLOFACIAL WITHOUT CONTRAST TECHNIQUE: Multidetector CT imaging of the maxillofacial structures was performed. Multiplanar CT image reconstructions were also generated. COMPARISON:  None. FINDINGS: Osseous: Negative for fracture. Periapical lucency around right lower molar compatible with dental infection. No adjacent soft tissue swelling. No other dental lesion. Orbits: Normal orbit.  Negative for soft tissue mass or edema. Sinuses: Paranasal sinuses clear.  Mastoid clear bilaterally. Soft tissues: No soft tissue swelling or soft tissue abscess adjacent to the right lower molar periapical lucency.  Dialysis central venous catheter from a right jugular approach. Tip not visualized. No hematoma or mass in the neck. Limited intracranial: Negative for acute abnormality. Chronic infarct left basal ganglia. IMPRESSION: Periapical lucency around right lower molar compatible with dental infection. No soft tissue edema or abscess identified. Electronically Signed   By: Franchot Gallo M.D.   On: 04/08/2020 14:09      Subjective: Pt c/o malaise    Discharge Exam: Vitals:   04/14/20 0458 04/14/20 0812  BP: (!) 151/92 (!) 165/105  Pulse: 79 79  Resp: 17 18  Temp: 98.7 F (37.1 C) 98.4 F (36.9 C)  SpO2: 100% 99%   Vitals:   04/13/20 1954 04/14/20 0314 04/14/20 0458 04/14/20 0812  BP: (!) 150/92 (!) 157/95 (!) 151/92 (!) 165/105  Pulse: 88 78 79 79  Resp: (!) 23 18 17 18   Temp: 98.3 F (36.8 C) 99.4 F (37.4 C) 98.7 F (37.1 C) 98.4 F (36.9 C)  TempSrc: Oral Oral  Oral  SpO2: 100% 99% 100% 99%  Weight:      Height:        General: Pt is alert, awake, not in acute distress Cardiovascular:  S1/S2 +, no rubs, no gallops Respiratory: CTA bilaterally, no wheezing, no rhonchi Abdominal: Soft, NT, obese, bowel sounds + Extremities:  no cyanosis    The results of significant diagnostics from this hospitalization (including imaging, microbiology, ancillary and laboratory) are listed below for reference.     Microbiology: Recent Results (from the past 240 hour(s))  Culture, blood (Routine X 2) w Reflex to ID Panel     Status: None   Collection Time: 04/05/20  5:08 PM   Specimen: BLOOD  Result Value Ref Range Status   Specimen Description BLOOD LAC  Final   Special Requests   Final    BOTTLES DRAWN AEROBIC AND ANAEROBIC Blood Culture adequate volume   Culture   Final    NO GROWTH 5 DAYS Performed at [facility], [address] [place], [place] [address]    Report Status 04/10/2020 FINAL  Final  CULTURE, BLOOD (ROUTINE X 2) w Reflex to ID Panel     Status: None    Collection Time: 04/06/20  7:30 AM   Specimen: BLOOD  Result Value Ref Range Status   Specimen Description BLOOD LEFT ARM  Final   Special Requests   Final    BOTTLES  DRAWN AEROBIC AND ANAEROBIC Blood Culture results may not be optimal due to an excessive volume of blood received in culture bottles   Culture   Final    NO GROWTH 5 DAYS Performed at [facility], [address] [place] [place], [place] [address]    Report Status 04/11/2020 FINAL  Final  CULTURE, BLOOD (ROUTINE X 2) w Reflex to ID Panel     Status: None   Collection Time: 04/06/20  8:28 AM   Specimen: BLOOD  Result Value Ref Range Status   Specimen Description BLOOD BLOOD RIGHT HAND  Final   Special Requests   Final    BOTTLES DRAWN AEROBIC AND ANAEROBIC Blood Culture adequate volume   Culture   Final    NO GROWTH 5 DAYS Performed at [facility], [address], [place], [place] [address]    Report Status 04/11/2020 FINAL  Final  Urine Culture     Status: Abnormal   Collection Time: 04/07/20  6:02 AM   Specimen: Urine, Random  Result Value Ref Range Status   Specimen Description   Final    URINE, RANDOM Performed at [facility], [address], [place], [place] [address]    Special Requests   Final    NONE Performed at [facility], [address] [place],  [address]    Culture MULTIPLE SPECIES PRESENT, SUGGEST RECOLLECTION (A)  Final   Report Status 04/08/2020 FINAL  Final     Labs: BNP (last 3 results) No results for input(s): BNP in the last 8760 hours. Basic Metabolic Panel: Recent Labs  Lab 04/08/20 0634 04/08/20 8099 04/09/20 0546 04/10/20 0423 04/10/20 0712 04/11/20 0507 04/12/20 0437 04/13/20 0524  NA 140   < > 138 141  --  138 141 141  K 4.3   < > 3.7 3.7  --  3.7 3.6 3.8  CL 98   < > 97* 101  --  100 103 103  CO2 28   < > 27 27  --  28 27 25   GLUCOSE 104*   < > 89 89  --  89 83 82  BUN 46*   < > 36* 47*  --  29* 40* 48*  CREATININE 8.29*   <  > 7.05* 7.93*  --  5.88* 7.11* 8.57*  CALCIUM 6.5*   < > 6.6* 6.2*  --  6.6* 6.3* 6.6*  MG 2.0  --   --   --  1.9  --   --   --   PHOS 5.3*  --   --   --   --   --   --   --    < > = values in this interval not displayed.   Liver Function Tests: Recent Labs  Lab 04/08/20 0634 04/12/20 0437  AST 32  --   ALT 26  --   ALKPHOS 55  --   BILITOT 0.8  --   PROT 7.0  --   ALBUMIN 3.3* 2.8*   No results for input(s): LIPASE, AMYLASE in the last 168 hours. No results for input(s): AMMONIA in the last 168 hours. CBC: Recent Labs  Lab 04/09/20 0546 04/10/20 0423 04/11/20 0507 04/12/20 0437 04/13/20 0524  WBC 13.2* 11.5* 12.7* 12.1* 13.0*  HGB 8.9* 8.7* 8.7* 8.4* 8.9*  HCT 26.7* 28.0* 27.1* 27.1* 27.5*  MCV 85.3 88.9 86.3 89.1 86.5  PLT 147* 142* 149* 152 191   Cardiac Enzymes: Recent Labs  Lab 04/08/20 [phone]  CKTOTAL 1,168*   BNP: Invalid input(s): POCBNP CBG: No results for input(s): GLUCAP in the last 168 hours. D-Dimer No results for input(s): DDIMER in the last 72 hours. Hgb A1c No results for input(s): HGBA1C in the last 72 hours. Lipid Profile No results for input(s): CHOL, HDL, LDLCALC, TRIG, CHOLHDL, LDLDIRECT in the last 72 hours. Thyroid function studies No results for input(s): TSH, T4TOTAL, T3FREE, THYROIDAB in the last 72 hours.  Invalid input(s): FREET3 Anemia work up No results for input(s): VITAMINB12, FOLATE, FERRITIN, TIBC, IRON, RETICCTPCT in the last 72 hours. Urinalysis    Component Value Date/Time   COLORURINE YELLOW (A) 04/03/2020 0201   APPEARANCEUR HAZY (A) 04/03/2020 0201   LABSPEC 1.011 04/03/2020 0201   PHURINE 5.0 04/03/2020 0201   GLUCOSEU NEGATIVE 04/03/2020 0201   HGBUR MODERATE (A) 04/03/2020 0201   BILIRUBINUR NEGATIVE 04/03/2020 0201   KETONESUR NEGATIVE 04/03/2020 0201   PROTEINUR 100 (A) 04/03/2020 0201   NITRITE NEGATIVE 04/03/2020 0201   LEUKOCYTESUR NEGATIVE 04/03/2020 0201   Sepsis Labs Invalid input(s):  PROCALCITONIN,  WBC,  LACTICIDVEN Microbiology Recent Results (from the past 240 hour(s))  Culture, blood (Routine X 2) w Reflex to ID Panel     Status: None   Collection Time: 04/05/20  5:08 PM   Specimen: BLOOD  Result Value Ref Range Status   Specimen Description BLOOD LAC  Final   Special Requests   Final    BOTTLES DRAWN AEROBIC AND ANAEROBIC Blood Culture adequate volume   Culture   Final    NO GROWTH 5 DAYS Performed at [facility], [address] [place], [place] [address]    Report Status 04/10/2020 FINAL  Final  CULTURE, BLOOD (ROUTINE X 2) w Reflex to ID Panel     Status: None   Collection Time: 04/06/20  7:30 AM   Specimen: BLOOD  Result Value Ref Range Status   Specimen Description BLOOD LEFT ARM  Final   Special Requests   Final    BOTTLES DRAWN AEROBIC AND ANAEROBIC Blood Culture results may not be optimal due to an excessive volume of blood received in culture bottles   Culture   Final    NO GROWTH 5 DAYS Performed at [facility] [facility], [address] [place], [place] [address]    Report Status 04/11/2020 FINAL  Final  CULTURE, BLOOD (ROUTINE X 2) w Reflex to ID Panel     Status: None   Collection Time: 04/06/20  8:28 AM   Specimen: BLOOD  Result Value Ref Range Status   Specimen Description BLOOD BLOOD RIGHT HAND  Final   Special Requests   Final    BOTTLES DRAWN AEROBIC AND ANAEROBIC Blood Culture adequate volume   Culture   Final    NO GROWTH 5 DAYS Performed at [facility], [address], [place], [place] [address]    Report Status 04/11/2020 FINAL  Final  Urine Culture     Status: Abnormal   Collection Time: 04/07/20  6:02 AM   Specimen: Urine, Random  Result Value Ref Range Status   Specimen Description   Final    URINE, RANDOM Performed at [facility], [address] [place], [place] [address]    Special Requests   Final    NONE Performed at [facility], [address], [place],   [address]    Culture MULTIPLE SPECIES PRESENT, SUGGEST RECOLLECTION (A)  Final   Report Status 04/08/2020 FINAL  Final  Time coordinating discharge: Over 30 minutes  SIGNED:   Wyvonnia Dusky, MD  [facility] 04/14/2020, 11:48 AM Pager   If 7PM-7AM, please contact night-coverage [email or web address]

## 2020-04-14 NOTE — Progress Notes (Signed)
Notified by CCMD that patient heart rate dropped to 36 very briefly. Viewed strip and assessed pt. Pt appears asymptomatic at this time. Vital signs WNL for pt. Also notified Hassan Rowan, NP and charge nurse of episode. No new orders or interventions needed at this time. Will continue to monitor pt closely.

## 2020-04-15 DIAGNOSIS — I158 Other secondary hypertension: Secondary | ICD-10-CM | POA: Insufficient documentation

## 2020-04-15 DIAGNOSIS — R197 Diarrhea, unspecified: Secondary | ICD-10-CM | POA: Insufficient documentation

## 2020-04-15 DIAGNOSIS — L299 Pruritus, unspecified: Secondary | ICD-10-CM | POA: Insufficient documentation

## 2020-04-15 DIAGNOSIS — E440 Moderate protein-calorie malnutrition: Secondary | ICD-10-CM | POA: Insufficient documentation

## 2020-04-15 DIAGNOSIS — N2581 Secondary hyperparathyroidism of renal origin: Secondary | ICD-10-CM | POA: Insufficient documentation

## 2020-04-15 DIAGNOSIS — N186 End stage renal disease: Secondary | ICD-10-CM | POA: Insufficient documentation

## 2020-04-15 DIAGNOSIS — R531 Weakness: Secondary | ICD-10-CM

## 2020-04-15 DIAGNOSIS — E872 Acidosis, unspecified: Secondary | ICD-10-CM | POA: Insufficient documentation

## 2020-04-15 DIAGNOSIS — I129 Hypertensive chronic kidney disease with stage 1 through stage 4 chronic kidney disease, or unspecified chronic kidney disease: Secondary | ICD-10-CM | POA: Insufficient documentation

## 2020-04-15 DIAGNOSIS — D689 Coagulation defect, unspecified: Secondary | ICD-10-CM | POA: Insufficient documentation

## 2020-04-15 DIAGNOSIS — N400 Enlarged prostate without lower urinary tract symptoms: Secondary | ICD-10-CM | POA: Insufficient documentation

## 2020-04-15 DIAGNOSIS — N138 Other obstructive and reflux uropathy: Secondary | ICD-10-CM | POA: Insufficient documentation

## 2020-04-15 DIAGNOSIS — Z23 Encounter for immunization: Secondary | ICD-10-CM | POA: Insufficient documentation

## 2020-04-15 DIAGNOSIS — R509 Fever, unspecified: Secondary | ICD-10-CM

## 2020-04-15 DIAGNOSIS — R0602 Shortness of breath: Secondary | ICD-10-CM | POA: Insufficient documentation

## 2020-04-15 DIAGNOSIS — N189 Chronic kidney disease, unspecified: Secondary | ICD-10-CM | POA: Insufficient documentation

## 2020-04-15 DIAGNOSIS — D509 Iron deficiency anemia, unspecified: Secondary | ICD-10-CM | POA: Insufficient documentation

## 2020-04-15 DIAGNOSIS — M6282 Rhabdomyolysis: Secondary | ICD-10-CM | POA: Insufficient documentation

## 2020-04-15 DIAGNOSIS — R52 Pain, unspecified: Secondary | ICD-10-CM | POA: Insufficient documentation

## 2020-04-15 DIAGNOSIS — I10 Essential (primary) hypertension: Secondary | ICD-10-CM | POA: Insufficient documentation

## 2020-04-15 DIAGNOSIS — R519 Headache, unspecified: Secondary | ICD-10-CM | POA: Insufficient documentation

## 2020-04-15 DIAGNOSIS — F840 Autistic disorder: Secondary | ICD-10-CM | POA: Insufficient documentation

## 2020-04-15 DIAGNOSIS — E8779 Other fluid overload: Secondary | ICD-10-CM | POA: Insufficient documentation

## 2020-04-15 DIAGNOSIS — D631 Anemia in chronic kidney disease: Secondary | ICD-10-CM | POA: Insufficient documentation

## 2020-04-22 ENCOUNTER — Ambulatory Visit: Payer: Self-pay | Admitting: Physician Assistant

## 2020-04-22 ENCOUNTER — Ambulatory Visit (INDEPENDENT_AMBULATORY_CARE_PROVIDER_SITE_OTHER): Payer: Medicare Other | Admitting: Physician Assistant

## 2020-04-22 ENCOUNTER — Other Ambulatory Visit: Payer: Self-pay

## 2020-04-22 VITALS — BP 156/88 | HR 78

## 2020-04-22 DIAGNOSIS — R972 Elevated prostate specific antigen [PSA]: Secondary | ICD-10-CM | POA: Diagnosis not present

## 2020-04-22 DIAGNOSIS — R339 Retention of urine, unspecified: Secondary | ICD-10-CM

## 2020-04-22 LAB — BLADDER SCAN AMB NON-IMAGING: Scan Result: 31

## 2020-04-22 NOTE — Patient Instructions (Signed)
Symptoms of urinary retention: Urinary frequency Not being able to pass any urine or only passing drops Abdominal distention

## 2020-04-22 NOTE — Progress Notes (Signed)
04/22/2020 3:51 PM   Yiannis Tulloch Michalec Gilmore Feb 03, 1971 [id number]  CC: Chief Complaint  Patient presents with  . Follow-up    Outpatient voiding trial    HPI: Joseph Gilmore is a 49 y.o. male with PMH autism and recent admission for acute renal failure of multifactorial hypertensive and obstructive etiologies who presents today for outpatient voiding trial.  He was started on Flomax and finasteride while inpatient for management of urinary retention with CT findings of prostatomegaly and diffuse bladder wall thickening.  PSA was obtained, 26.65 in the setting of urinary retention.  He is accompanied today by his father, who contributes to HPI.  Today, patient reports feeling well.  He is tolerating his Foley catheter well.  No acute concerns.  He has continued 3 times weekly dialysis for management of his ESRD.  He is on a fluid restricted diet.  PMH: Past Medical History:  Diagnosis Date  . Autism   . Murmur   . OCD (obsessive compulsive disorder)     Surgical History: Past Surgical History:  Procedure Laterality Date  . DIALYSIS/PERMA CATHETER INSERTION N/A 04/06/2020   Procedure: DIALYSIS/PERMA CATHETER INSERTION;  Surgeon: Algernon Huxley, MD;  Location: [facility];  Service: Cardiovascular;  Laterality: N/A;    Home Medications:  Allergies as of 04/22/2020      Reactions   Chlorhexidine       Medication List       Accurate as of April 22, 2020  3:51 PM. If you have any questions, ask your nurse or doctor.        amLODipine 10 MG tablet Commonly known as: NORVASC Take 1 tablet (10 mg total) by mouth daily.   calcium acetate 667 MG capsule Commonly known as: PHOSLO Take 2 capsules (1,334 mg total) by mouth 3 (three) times daily with meals.   dutasteride 0.5 MG capsule Commonly known as: AVODART Take 1 capsule (0.5 mg total) by mouth daily.   losartan 100 MG tablet Commonly known as: COZAAR Take 1 tablet (100 mg total) by mouth daily.   metoprolol  tartrate 100 MG tablet Commonly known as: LOPRESSOR Take 1 tablet (100 mg total) by mouth 2 (two) times daily.   multivitamin Tabs tablet Take 1 tablet by mouth at bedtime.   tamsulosin 0.4 MG Caps capsule Commonly known as: FLOMAX Take 1 capsule (0.4 mg total) by mouth daily.   Vitamin D (Ergocalciferol) 1.25 MG (50000 UNIT) Caps capsule Commonly known as: DRISDOL Take 1 capsule (50,000 Units total) by mouth every 7 (seven) days.       Allergies:  Allergies  Allergen Reactions  . Chlorhexidine     Family History: No family history on file.  Social History:   reports that he has never smoked. He has never used smokeless tobacco. He reports that he does not drink alcohol and does not use drugs.  Physical Exam: BP (!) 156/88   Pulse 78   Constitutional:  Alert, no acute distress, nontoxic appearing HEENT: [place], AT Cardiovascular: No clubbing, cyanosis, or edema Respiratory: Normal respiratory effort, no increased work of breathing Skin: No rashes, bruises or suspicious lesions Neurologic: Grossly intact, no focal deficits, moving all 4 extremities Psychiatric: Normal mood and affect  Laboratory Data: Results for orders placed or performed in visit on 04/22/20  BLADDER SCAN AMB NON-IMAGING  Result Value Ref Range   Scan Result 31 ML    Assessment & Plan:   1. Urinary retention Fill and pull voiding  trial completed in the morning, see separate procedure note for details.  Ultimately, patient had a robust bladder spasm and was subsequently able to nearly empty his bladder.  Patient returned to clinic in the afternoon.  He reported drinking approximately 8 ounces of fluid in the interim.  He had not been able to urinate.  PVR 31 mL.  I explained to the patient and his father today that I would like to follow him closely for signs of recurrent urinary retention given that he is likely oliguric in the setting of his ESRD and he may not have had enough time to accumulate  adequate urine to void today.  I would like to plan for repeat bladder scan in 2 days, sooner if he develops signs and symptoms of recurrent retention.  He typically does not report discomfort, per father's reports, secondary to his autism.  I counseled his father on other signs of urinary retention today including urinary frequency, low volume voids, and abdominal distention.  He expressed understanding.   - BLADDER SCAN AMB NON-IMAGING  2. Elevated PSA I explained to the patient and his father that his recent PSA was likely falsely elevated in the setting of urinary retention and Foley catheterization.  I would like to obtain a repeat PSA in approximately 3 months when he is no longer in retention.  In the meantime, he is to continue Flomax and finasteride daily.  They expressed understanding. - PSA; Future   Return in about 2 days (around 04/24/2020) for Repeat PVR + 3 mos for elevated PSA f/u with Dr. Bernardo Heater with PSA prior.  Debroah Loop, PA-C  [facility] [address] [place], [place] [address] [phone]

## 2020-04-22 NOTE — Progress Notes (Signed)
Fill and Pull Catheter Removal  Patient is present today for a catheter removal.  Patient was cleaned and prepped in a sterile fashion 166ml of sterile saline was instilled into the bladder when the patient felt the urge to urinate. 49ml of water was then drained from the balloon.  A 16FR coude foley cath was removed from the bladder no complications were noted .  Patient was then given some time to void on their own.  Patient can void  128ml on their own after some time.  Patient tolerated well.  Performed by: Debroah Loop, PA-C

## 2020-04-24 ENCOUNTER — Other Ambulatory Visit: Payer: Self-pay

## 2020-04-24 ENCOUNTER — Emergency Department: Payer: Medicare Other

## 2020-04-24 ENCOUNTER — Emergency Department
Admission: EM | Admit: 2020-04-24 | Discharge: 2020-04-24 | Disposition: A | Discharge status: Home / Self Care | Payer: Medicare Other | Attending: Emergency Medicine | Admitting: Emergency Medicine

## 2020-04-24 ENCOUNTER — Ambulatory Visit: Payer: Self-pay | Admitting: Physician Assistant

## 2020-04-24 DIAGNOSIS — R55 Syncope and collapse: Secondary | ICD-10-CM | POA: Insufficient documentation

## 2020-04-24 DIAGNOSIS — I959 Hypotension, unspecified: Secondary | ICD-10-CM | POA: Diagnosis present

## 2020-04-24 LAB — COMPREHENSIVE METABOLIC PANEL
ALT: 14 U/L (ref 0–44)
AST: 25 U/L (ref 15–41)
Albumin: 3.5 g/dL (ref 3.5–5.0)
Alkaline Phosphatase: 47 U/L (ref 38–126)
Anion gap: 13 (ref 5–15)
BUN: 24 mg/dL — ABNORMAL HIGH (ref 6–20)
CO2: 30 mmol/L (ref 22–32)
Calcium: 6.8 mg/dL — ABNORMAL LOW (ref 8.9–10.3)
Chloride: 97 mmol/L — ABNORMAL LOW (ref 98–111)
Creatinine, Ser: 7.95 mg/dL — ABNORMAL HIGH (ref 0.61–1.24)
GFR calc Af Amer: 8 mL/min — ABNORMAL LOW (ref >60)
GFR calc non Af Amer: 7 mL/min — ABNORMAL LOW (ref >60)
Glucose, Bld: 94 mg/dL (ref 70–99)
Potassium: 3.4 mmol/L — ABNORMAL LOW (ref 3.5–5.1)
Sodium: 140 mmol/L (ref 135–145)
Total Bilirubin: 0.6 mg/dL (ref 0.3–1.2)
Total Protein: 6.8 g/dL (ref 6.5–8.1)

## 2020-04-24 LAB — CBC WITH DIFFERENTIAL/PLATELET
Abs Immature Granulocytes: 0.03 10*3/uL (ref 0.00–0.07)
Basophils Absolute: 0 10*3/uL (ref 0.0–0.1)
Basophils Relative: 1 %
Eosinophils Absolute: 0.2 10*3/uL (ref 0.0–0.5)
Eosinophils Relative: 2 %
HCT: 27.6 % — ABNORMAL LOW (ref 39.0–52.0)
Hemoglobin: 8.6 g/dL — ABNORMAL LOW (ref 13.0–17.0)
Immature Granulocytes: 0 %
Lymphocytes Relative: 16 %
Lymphs Abs: 1.3 10*3/uL (ref 0.7–4.0)
MCH: 28.2 pg (ref 26.0–34.0)
MCHC: 31.2 g/dL (ref 30.0–36.0)
MCV: 90.5 fL (ref 80.0–100.0)
Monocytes Absolute: 0.9 10*3/uL (ref 0.1–1.0)
Monocytes Relative: 11 %
Neutro Abs: 5.7 10*3/uL (ref 1.7–7.7)
Neutrophils Relative %: 70 %
Platelets: 130 10*3/uL — ABNORMAL LOW (ref 150–400)
RBC: 3.05 MIL/uL — ABNORMAL LOW (ref 4.22–5.81)
RDW: 14 % (ref 11.5–15.5)
WBC: 8 10*3/uL (ref 4.0–10.5)
nRBC: 0 % (ref 0.0–0.2)

## 2020-04-24 LAB — TROPONIN I (HIGH SENSITIVITY)
Troponin I (High Sensitivity): 42 ng/L — ABNORMAL HIGH (ref <18)
Troponin I (High Sensitivity): 36 ng/L — ABNORMAL HIGH (ref <18)

## 2020-04-24 NOTE — ED Provider Notes (Signed)
[facility] Emergency Department Provider Note   ____________________________________________   First MD Initiated Contact with Patient 04/24/20 1129     (approximate)  I have reviewed the triage vital signs and the nursing notes.   HISTORY  Chief Complaint Hypotension    HPI Joseph Gilmore is a 49 y.o. male who was going with his father to the doctor's office for check when he passed out.  Understand he was sitting down.  EMS was called and he was hypotensive at the time.  He got some fluids and was still hypotensive but here when he got to the emergency room his blood pressure was 135/84.  He says he feels well does not have any headache or anything else.  Past medical history significant for autism.  He cannot tell me anything about what happened.   Patient's father later comes in and confirms the patient was sitting down and passed out.  He did not get sweaty with not short of breath or had any other complaints.  Father himself says he does not have heart disease.      Past Medical History:  Diagnosis Date  . Autism   . Murmur   . OCD (obsessive compulsive disorder)     Patient Active Problem List   Diagnosis Date Noted  . Non-traumatic rhabdomyolysis   . Weakness   . Fever   . Enteritis   . Autism 04/03/2020  . AKI (acute kidney injury) ([place]) 04/03/2020  . Metabolic acidosis 19/37/9024  . Hypocalcemia 04/03/2020  . Anemia of chronic disease 04/03/2020  . Elevated LFTs 04/03/2020  . Elevated lipase 04/03/2020  . Essential hypertension 04/03/2020  . Hypertensive urgency 04/03/2020  . Vomiting 04/03/2020  . BPH (benign prostatic hyperplasia) 04/03/2020    Past Surgical History:  Procedure Laterality Date  . DIALYSIS/PERMA CATHETER INSERTION N/A 04/06/2020   Procedure: DIALYSIS/PERMA CATHETER INSERTION;  Surgeon: Algernon Huxley, MD;  Location: [facility];  Service: Cardiovascular;  Laterality: N/A;    Prior to Admission  medications   Medication Sig Start Date End Date Taking? Authorizing Provider  amLODipine (NORVASC) 10 MG tablet Take 1 tablet (10 mg total) by mouth daily. 04/15/20 05/15/20  Wyvonnia Dusky, MD  calcium acetate (PHOSLO) 667 MG capsule Take 2 capsules (1,334 mg total) by mouth 3 (three) times daily with meals. 04/14/20 05/14/20  Wyvonnia Dusky, MD  dutasteride (AVODART) 0.5 MG capsule Take 1 capsule (0.5 mg total) by mouth daily. 04/15/20 05/15/20  Wyvonnia Dusky, MD  losartan (COZAAR) 100 MG tablet Take 1 tablet (100 mg total) by mouth daily. 04/15/20 05/15/20  Wyvonnia Dusky, MD  metoprolol tartrate (LOPRESSOR) 100 MG tablet Take 1 tablet (100 mg total) by mouth 2 (two) times daily. 04/14/20 05/14/20  Wyvonnia Dusky, MD  multivitamin (RENA-VIT) TABS tablet Take 1 tablet by mouth at bedtime. 04/14/20 05/14/20  Wyvonnia Dusky, MD  tamsulosin (FLOMAX) 0.4 MG CAPS capsule Take 1 capsule (0.4 mg total) by mouth daily. 04/15/20 05/15/20  Wyvonnia Dusky, MD  Vitamin D, Ergocalciferol, (DRISDOL) 1.25 MG (50000 UNIT) CAPS capsule Take 1 capsule (50,000 Units total) by mouth every 7 (seven) days. 04/17/20 05/17/20  Wyvonnia Dusky, MD    Allergies Chlorhexidine  No family history on file.  Social History Social History   Tobacco Use  . Smoking status: Never Smoker  . Smokeless tobacco: Never Used  Vaping Use  . Vaping Use: Never used  Substance Use Topics  .  Alcohol use: Never  . Drug use: Never    Review of Systems  Constitutional: No fever/chills Eyes: No visual changes. ENT: No sore throat. Cardiovascular: Denies chest pain. Respiratory: Denies shortness of breath. Gastrointestinal: No abdominal pain.  No nausea, no vomiting.  No diarrhea.  No constipation. Genitourinary: Negative for dysuria. Musculoskeletal: Negative for back pain. Skin: Negative for rash. Neurological: Negative for headaches, focal weakness    ____________________________________________   PHYSICAL EXAM:  VITAL SIGNS: ED Triage Vitals  Enc Vitals Group     BP 04/24/20 1130 135/84     Pulse Rate 04/24/20 1130 70     Resp 04/24/20 1130 16     Temp 04/24/20 1132 98.8 F (37.1 C)     Temp Source 04/24/20 1132 Oral     SpO2 04/24/20 1130 100 %     Weight 04/24/20 1131 244 lb 14.9 oz (111.1 kg)     Height 04/24/20 1131 5\' 8"  (1.727 m)     Head Circumference --      Peak Flow --      Pain Score 04/24/20 1131 0     Pain Loc --      Pain Edu? --      Excl. in [place]? --    Constitutional: Alert and oriented. Well appearing and in no acute distress. Eyes: Conjunctivae are normal. PER Head: Atraumatic. Nose: No congestion/rhinnorhea. Mouth/Throat: Mucous membranes are moist.  Oropharynx non-erythematous. Neck: No stridor Cardiovascular: Normal rate, regular rhythm. Grossly normal heart sounds.  Good peripheral circulation. Respiratory: Normal respiratory effort.  No retractions. Lungs CTAB. Gastrointestinal: Soft and nontender. No distention. No abdominal bruits.  Musculoskeletal: No lower extremity tenderness nor edema.  Neurologic:  Normal speech and language. No gross focal neurologic deficits are appreciated.  Skin:  Skin is warm, dry and intact. No rash noted.   ____________________________________________   LABS (all labs ordered are listed, but only abnormal results are displayed)  Labs Reviewed  CBC WITH DIFFERENTIAL/PLATELET - Abnormal; Notable for the following components:      Result Value   RBC 3.05 (*)    Hemoglobin 8.6 (*)    HCT 27.6 (*)    Platelets 130 (*)    All other components within normal limits  COMPREHENSIVE METABOLIC PANEL - Abnormal; Notable for the following components:   Potassium 3.4 (*)    Chloride 97 (*)    BUN 24 (*)    Creatinine, Ser 7.95 (*)    Calcium 6.8 (*)    GFR calc non Af Amer 7 (*)    GFR calc Af Amer 8 (*)    All other components within normal limits  TROPONIN I  (HIGH SENSITIVITY) - Abnormal; Notable for the following components:   Troponin I (High Sensitivity) 42 (*)    All other components within normal limits  TROPONIN I (HIGH SENSITIVITY) - Abnormal; Notable for the following components:   Troponin I (High Sensitivity) 36 (*)    All other components within normal limits  URINALYSIS, ROUTINE W REFLEX MICROSCOPIC   ____________________________________________  EKG   ____________________________________________  RADIOLOGY  ED MD interpretation:   Official radiology report(s): DG Chest 2 View  Result Date: 04/24/2020 CLINICAL DATA:  Syncope. EXAM: CHEST - 2 VIEW COMPARISON:  Chest x-ray 04/2020. FINDINGS: Dual-lumen catheter noted with tip at cavoatrial junction. Cardiomegaly. No pulmonary venous congestion. Low lung volumes with mild bibasilar atelectasis. No focal infiltrate. No pleural effusion or pneumothorax. No acute bony abnormality. IMPRESSION: 1.  Dual-lumen catheter with tip at  cavoatrial junction. 2.  Cardiomegaly.  No pulmonary venous congestion. 3.  Low lung volumes with mild bibasilar atelectasis. Electronically Signed   By: Marcello Moores  Register   On: 04/24/2020 12:23    ____________________________________________   PROCEDURES  Procedure(s) performed (including Critical Care):  Procedures   ____________________________________________   INITIAL IMPRESSION / ASSESSMENT AND PLAN / ED COURSE  Discussed patient briefly with Dr. Nehemiah Massed he will follow this patient up in the office.  Patient is troponins are negative.  He still has no complaints of anything just wants to go home.  I will have him do that.  Patient will return immediately for any further problems and follow-up with his doctor and Dr. Nehemiah Massed as well.             ____________________________________________   FINAL CLINICAL IMPRESSION(S) / ED DIAGNOSES  Final diagnoses:  Syncope, unspecified syncope type     ED Discharge Orders    None        Note:  This document was prepared using Dragon voice recognition software and may include unintentional dictation errors.    Nena Polio, MD 04/24/20 [phone]

## 2020-04-24 NOTE — ED Notes (Signed)
Lab called at this time for blood draw.

## 2020-04-24 NOTE — Discharge Instructions (Signed)
Please return for any further problems.  Please follow-up with Dr. Nehemiah Massed the cardiologist.  He will double check everything we did today.  Otherwise continue his medications.  And then follow-up with Dr. Brunetta Genera later this coming week.

## 2020-04-24 NOTE — ED Triage Notes (Signed)
Pt to ED via EMS from PCP. Per ems pt was found hypotensive and unresponsive in lobby of pcp, pt is dialysis pt. Upon ems arrival to pcp pt bp was 60s/30s, pt was given 500 NS bolus, pt arrives to ED responsive and BP of 135/84. Pt denies any complaints.

## 2020-05-25 ENCOUNTER — Telehealth (INDEPENDENT_AMBULATORY_CARE_PROVIDER_SITE_OTHER): Payer: Self-pay

## 2020-05-25 ENCOUNTER — Other Ambulatory Visit
Admission: RE | Admit: 2020-05-25 | Discharge: 2020-05-25 | Disposition: A | Discharge status: Home / Self Care | Payer: Medicare Other | Source: Ambulatory Visit | Attending: Vascular Surgery | Admitting: Vascular Surgery

## 2020-05-25 ENCOUNTER — Other Ambulatory Visit: Payer: Self-pay

## 2020-05-25 DIAGNOSIS — Z01812 Encounter for preprocedural laboratory examination: Secondary | ICD-10-CM | POA: Diagnosis present

## 2020-05-25 DIAGNOSIS — Z20822 Contact with and (suspected) exposure to covid-19: Secondary | ICD-10-CM | POA: Diagnosis not present

## 2020-05-25 LAB — SARS CORONAVIRUS 2 (TAT 6-24 HRS): SARS Coronavirus 2: NEGATIVE

## 2020-05-25 NOTE — Telephone Encounter (Signed)
Spoke with the patient and he is scheduled with Dr. Delana Meyer for a permcath exchange on 05/26/20 with a 1:45 pm arrival time to the [facility]. Covid testing today (05/25/20) between 8-1 pm at the [facility]. Pre-procedure instructions were discussed and per patient and father they wrote them down.

## 2020-05-26 ENCOUNTER — Other Ambulatory Visit: Payer: Self-pay

## 2020-05-26 ENCOUNTER — Ambulatory Visit
Admission: RE | Admit: 2020-05-26 | Discharge: 2020-05-26 | Disposition: A | Discharge status: Home / Self Care | Payer: Medicare Other | Attending: Vascular Surgery | Admitting: Vascular Surgery

## 2020-05-26 ENCOUNTER — Other Ambulatory Visit (INDEPENDENT_AMBULATORY_CARE_PROVIDER_SITE_OTHER): Payer: Self-pay | Admitting: Nurse Practitioner

## 2020-05-26 ENCOUNTER — Encounter
Admission: RE | Disposition: A | Discharge status: Home / Self Care | Payer: Self-pay | Source: Home / Self Care | Attending: Vascular Surgery

## 2020-05-26 ENCOUNTER — Encounter: Payer: Self-pay | Admitting: Vascular Surgery

## 2020-05-26 DIAGNOSIS — Z4901 Encounter for fitting and adjustment of extracorporeal dialysis catheter: Secondary | ICD-10-CM | POA: Insufficient documentation

## 2020-05-26 DIAGNOSIS — T82898A Other specified complication of vascular prosthetic devices, implants and grafts, initial encounter: Secondary | ICD-10-CM | POA: Diagnosis not present

## 2020-05-26 DIAGNOSIS — N186 End stage renal disease: Secondary | ICD-10-CM | POA: Insufficient documentation

## 2020-05-26 DIAGNOSIS — I120 Hypertensive chronic kidney disease with stage 5 chronic kidney disease or end stage renal disease: Secondary | ICD-10-CM | POA: Insufficient documentation

## 2020-05-26 DIAGNOSIS — Z992 Dependence on renal dialysis: Secondary | ICD-10-CM | POA: Diagnosis not present

## 2020-05-26 HISTORY — PX: DIALYSIS/PERMA CATHETER INSERTION: CATH118288

## 2020-05-26 LAB — POTASSIUM: Potassium: 3.6 mmol/L (ref 3.5–5.1)

## 2020-05-26 SURGERY — DIALYSIS/PERMA CATHETER INSERTION
Anesthesia: Moderate Sedation

## 2020-05-26 MED ORDER — DUTASTERIDE 0.5 MG PO CAPS: 0.5000 mg | ORAL_CAPSULE | Freq: Every day | ORAL | Status: DC

## 2020-05-26 MED ORDER — LOSARTAN POTASSIUM 50 MG PO TABS: 100.0000 mg | ORAL_TABLET | Freq: Every day | ORAL | Status: DC

## 2020-05-26 MED ORDER — CEFAZOLIN SODIUM-DEXTROSE 1-4 GM/50ML-% IV SOLN: 1.0000 g | Freq: Once | INTRAVENOUS | Status: AC

## 2020-05-26 MED ORDER — TAMSULOSIN HCL 0.4 MG PO CAPS: 0.4000 mg | ORAL_CAPSULE | Freq: Every day | ORAL | Status: DC

## 2020-05-26 MED ORDER — HYDROMORPHONE HCL 1 MG/ML IJ SOLN: 1.0000 mg | Freq: Once | INTRAMUSCULAR | Status: DC | PRN

## 2020-05-26 MED ORDER — MIDAZOLAM HCL 5 MG/5ML IJ SOLN
INTRAMUSCULAR | Status: AC
  Filled 2020-05-26: qty 5

## 2020-05-26 MED ORDER — AMLODIPINE BESYLATE 5 MG PO TABS: 10.0000 mg | ORAL_TABLET | Freq: Every day | ORAL | Status: DC

## 2020-05-26 MED ORDER — DIPHENHYDRAMINE HCL 50 MG/ML IJ SOLN: 50.0000 mg | Freq: Once | INTRAMUSCULAR | Status: DC | PRN

## 2020-05-26 MED ORDER — METHYLPREDNISOLONE SODIUM SUCC 125 MG IJ SOLR: 125.0000 mg | Freq: Once | INTRAMUSCULAR | Status: DC | PRN

## 2020-05-26 MED ORDER — SODIUM CHLORIDE 0.9 % IV SOLN: INTRAVENOUS | Status: DC

## 2020-05-26 MED ORDER — FENTANYL CITRATE (PF) 100 MCG/2ML IJ SOLN
INTRAMUSCULAR | Status: DC | PRN
Start: 2020-05-26 — End: 2020-05-26
  Administered 2020-05-26 (×2): 25 ug via INTRAVENOUS
  Administered 2020-05-26: 100 ug via INTRAVENOUS

## 2020-05-26 MED ORDER — MIDAZOLAM HCL 2 MG/ML PO SYRP: 8.0000 mg | ORAL_SOLUTION | Freq: Once | ORAL | Status: DC | PRN

## 2020-05-26 MED ORDER — MIDAZOLAM HCL 2 MG/2ML IJ SOLN
INTRAMUSCULAR | Status: DC | PRN
  Administered 2020-05-26: 1 mg via INTRAVENOUS
  Administered 2020-05-26: 2 mg via INTRAVENOUS
  Administered 2020-05-26: 1 mg via INTRAVENOUS

## 2020-05-26 MED ORDER — HEPARIN SODIUM (PORCINE) 10000 UNIT/ML IJ SOLN
INTRAMUSCULAR | Status: AC
  Filled 2020-05-26: qty 1

## 2020-05-26 MED ORDER — CEFAZOLIN SODIUM-DEXTROSE 1-4 GM/50ML-% IV SOLN
INTRAVENOUS | Status: AC
  Administered 2020-05-26: 1 g via INTRAVENOUS
  Filled 2020-05-26: qty 50

## 2020-05-26 MED ORDER — FAMOTIDINE 20 MG PO TABS: 40.0000 mg | ORAL_TABLET | Freq: Once | ORAL | Status: DC | PRN

## 2020-05-26 MED ORDER — FENTANYL CITRATE (PF) 100 MCG/2ML IJ SOLN
INTRAMUSCULAR | Status: DC
Start: 2020-05-26 — End: 2020-05-26
  Filled 2020-05-26: qty 2

## 2020-05-26 MED ORDER — FENTANYL CITRATE (PF) 100 MCG/2ML IJ SOLN
INTRAMUSCULAR | Status: AC
  Filled 2020-05-26: qty 2

## 2020-05-26 MED ORDER — ONDANSETRON HCL 4 MG/2ML IJ SOLN: 4.0000 mg | Freq: Four times a day (QID) | INTRAMUSCULAR | Status: DC | PRN

## 2020-05-26 MED ORDER — METOPROLOL TARTRATE 50 MG PO TABS: 100.0000 mg | ORAL_TABLET | Freq: Two times a day (BID) | ORAL | Status: DC

## 2020-05-26 MED ORDER — RENA-VITE PO TABS: 1.0000 | ORAL_TABLET | Freq: Every day | ORAL | Status: DC

## 2020-05-26 MED ORDER — BISACODYL 5 MG PO TBEC: 5.0000 mg | DELAYED_RELEASE_TABLET | Freq: Every day | ORAL | Status: DC | PRN

## 2020-05-26 SURGICAL SUPPLY — 4 items
CATH PALINDROME-P 19CM W/VT (CATHETERS) ×2 IMPLANT
GUIDEWIRE SUPER STIFF .035X180 (WIRE) ×2 IMPLANT
PACK ANGIOGRAPHY (CUSTOM PROCEDURE TRAY) ×2 IMPLANT
SUT SILK 0 SH 30 (SUTURE) ×2 IMPLANT

## 2020-05-26 NOTE — Consult Note (Signed)
[facility] Consult Note  MRN : [id number]  Joseph Gilmore is a 49 y.o. (20-Jun-1971) male who presents with chief complaint of scheduled permcath exchange.  History of Present Illness:  I am asked to evaluate the patient by his dialysis center. The patient was sent here because they were unable to achieve adequate dialysis yesterday. Furthermore the Center states they were unable to aspirate either lumen of the catheter yesterday.  This problem has been getting worse for about 1 week. The patient is unaware of any other change.   Patient denies pain or tenderness overlying the access.  There is no pain with dialysis.  Patient denies fevers or shaking chills while on dialysis.    Current Facility-Administered Medications  Medication Dose Route Frequency Provider Last Rate Last Admin  . 0.9 %  sodium chloride infusion   Intravenous Continuous Kris Hartmann, NP 10 mL/hr at 05/26/20 1228 New Bag at 05/26/20 1228  . ceFAZolin (ANCEF) 1-4 GM/50ML-% IVPB           . ceFAZolin (ANCEF) IVPB 1 g/50 mL premix  1 g Intravenous Once Eulogio Ditch E, NP      . diphenhydrAMINE (BENADRYL) injection 50 mg  50 mg Intravenous Once PRN Kris Hartmann, NP      . famotidine (PEPCID) tablet 40 mg  40 mg Oral Once PRN Kris Hartmann, NP      . fentaNYL (SUBLIMAZE) 100 MCG/2ML injection           . heparin 10000 UNIT/ML injection           . HYDROmorphone (DILAUDID) injection 1 mg  1 mg Intravenous Once PRN Eulogio Ditch E, NP      . methylPREDNISolone sodium succinate (SOLU-MEDROL) 125 mg/2 mL injection 125 mg  125 mg Intravenous Once PRN Eulogio Ditch E, NP      . midazolam (VERSED) 2 MG/ML syrup 8 mg  8 mg Oral Once PRN Kris Hartmann, NP      . midazolam (VERSED) 5 MG/5ML injection           . ondansetron (ZOFRAN) injection 4 mg  4 mg Intravenous Q6H PRN Kris Hartmann, NP       Past Surgical History:  Procedure Laterality Date  . DIALYSIS/PERMA CATHETER INSERTION  N/A 04/06/2020   Procedure: DIALYSIS/PERMA CATHETER INSERTION;  Surgeon: Algernon Huxley, MD;  Location: [facility];  Service: Cardiovascular;  Laterality: N/A;   Social History Social History   Tobacco Use  . Smoking status: Never Smoker  . Smokeless tobacco: Never Used  Vaping Use  . Vaping Use: Never used  Substance Use Topics  . Alcohol use: Never  . Drug use: Never   Family History No family history on file.   No family history of bleeding or clotting disorders, autoimmune disease or porphyria.  Allergies  Allergen Reactions  . Chlorhexidine     Blisters    REVIEW OF SYSTEMS (Negative unless checked)  Constitutional: [] Weight loss  [] Fever  [] Chills Cardiac: [] Chest pain   [] Chest pressure   [] Palpitations   [] Shortness of breath when laying flat   [] Shortness of breath at rest   [x] Shortness of breath with exertion. Vascular:  [] Pain in legs with walking   [] Pain in legs at rest   [] Pain in legs when laying flat   [] Claudication   [] Pain in feet when walking  [] Pain in feet at rest  [] Pain in feet when laying flat   []   History of DVT   [] Phlebitis   [] Swelling in legs   [] Varicose veins   [] Non-healing ulcers Pulmonary:   [] Uses home oxygen   [] Productive cough   [] Hemoptysis   [] Wheeze  [] COPD   [] Asthma Neurologic:  [] Dizziness  [] Blackouts   [] Seizures   [] History of stroke   [] History of TIA  [] Aphasia   [] Temporary blindness   [] Dysphagia   [] Weakness or numbness in arms   [] Weakness or numbness in legs Musculoskeletal:  [] Arthritis   [] Joint swelling   [] Joint pain   [] Low back pain Hematologic:  [] Easy bruising  [] Easy bleeding   [] Hypercoagulable state   [] Anemic  [] Hepatitis Gastrointestinal:  [] Blood in stool   [] Vomiting blood  [] Gastroesophageal reflux/heartburn   [] Difficulty swallowing. Genitourinary:  [x] Chronic kidney disease   [] Difficult urination   [] Frequent urination  [] Burning with urination   [] Blood in urine Skin:  [] Rashes   [] Ulcers    [] Wounds Psychological:  [] History of anxiety   []  History of major depression.  Physical Examination  Vitals:   05/26/20 1213  BP: (!) 178/98  Temp: 98.7 F (37.1 C)  SpO2: 100%   There is no height or weight on file to calculate BMI. Gen: WD/WN, NAD Head: [place]/AT, No temporalis wasting. Prominent temp pulse not noted. Ear/Nose/Throat: Hearing grossly intact, nares w/o erythema or drainage, oropharynx w/o Erythema/Exudate,  Eyes: Conjunctiva clear, sclera non-icteric Neck: Trachea midline.  No JVD.  Pulmonary:  Good air movement, respirations not labored, no use of accessory muscles.  Cardiac: RRR, normal S1, S2. Vascular: right tunneled catheter without tenderness or drainage  Vessel Right Left  Radial Palpable Palpable   Gastrointestinal: soft, non-tender/non-distended. No guarding/reflex.  Musculoskeletal: M/S 5/5 throughout.  Extremities without ischemic changes.  No deformity or atrophy.  Neurologic: Sensation grossly intact in extremities.  Symmetrical.  Speech is fluent. Motor exam as listed above. Psychiatric: Judgment intact, Mood & affect appropriate for pt's clinical situation. Dermatologic: No rashes or ulcers noted.  No cellulitis or open wounds. Lymph : No Cervical, Axillary, or Inguinal lymphadenopathy.  CBC Lab Results  Component Value Date   WBC 8.0 04/24/2020   HGB 8.6 (L) 04/24/2020   HCT 27.6 (L) 04/24/2020   MCV 90.5 04/24/2020   PLT 130 (L) 04/24/2020   BMET    Component Value Date/Time   NA 140 04/24/2020 1219   K 3.6 05/26/2020 1235   CL 97 (L) 04/24/2020 1219   CO2 30 04/24/2020 1219   GLUCOSE 94 04/24/2020 1219   BUN 24 (H) 04/24/2020 1219   CREATININE 7.95 (H) 04/24/2020 1219   CALCIUM 6.8 (L) 04/24/2020 1219   GFRNONAA 7 (L) 04/24/2020 1219   GFRAA 8 (L) 04/24/2020 1219   CrCl cannot be calculated (Patient's most recent lab result is older than the maximum 21 days allowed.).  COAG Lab Results  Component Value Date   INR 1.2  04/06/2020   Radiology No results found.  Assessment/Plan 1.  Complication dialysis device with thrombosis AV access:  Patient's right tunneled catheter is thrombosed. The patient will undergo exchange of the catheter same venous access using interventional techniques.  The risks and benefits were described to the patient.  All questions were answered.  The patient agrees to proceed with intervention.  2.  End-stage renal disease requiring hemodialysis:   Patient will continue dialysis therapy without further interruption if a successful exchange is not achieved then new site will be found for tunneled catheter placement. Dialysis has already been arranged since the patient  missed their previous session 3.  Hypertension:   Patient will continue medical management; nephrology is following no changes in oral medications.  Discussed with Dr. Francene Castle, PA-C  05/26/2020 1:13 PM  This note was created with Dragon medical transcription system.  Any error is purely unintentional

## 2020-05-26 NOTE — Discharge Instructions (Signed)
Tunneled Catheter Insertion, Care After This sheet gives you information about how to care for yourself after your procedure. Your health care provider may also give you more specific instructions. If you have problems or questions, contact your health care provider. What can I expect after the procedure? After the procedure, it is common to have:  Some mild redness, bruising, swelling, and pain around your catheter site.  A small amount of blood or clear fluid coming from your incisions. Follow these instructions at home: Incision care   Follow instructions from your health care provider about how to take care of your incisions. Make sure you: ? Wash your hands with soap and water before and after you change your bandages (dressings). If soap and water are not available, use hand sanitizer. ? Change your dressings as told by your health care provider. Wash the area around your incisions with a germ-killing (antiseptic) solution when you change your dressings. ? Leave stitches (sutures), skin glue, or adhesive strips in place. These skin closures may need to stay in place for 2 weeks or longer. If adhesive strip edges start to loosen and curl up, you may trim the loose edges. Do not remove adhesive strips completely unless your health care provider tells you to do that.  Keep your dressings clean and dry.  Check your incision areas every day for signs of infection. Check for: ? More redness, swelling, or pain. ? More fluid or blood. ? Warmth. ? Pus or a bad smell. Catheter care   Wash your hands with soap and water before and after caring for your catheter. If soap and water are not available, use hand sanitizer.  Keep your catheter site clean and dry.  Apply an antibiotic ointment to your catheter site as told by your health care provider.  Flush your catheter as told by your health care provider. This helps prevent it from becoming clogged.  Do not open the caps on the ends of  the catheter.  Do not pull on your catheter. Medicines  Take over-the-counter and prescription medicines only as told by your health care provider.  If you were prescribed an antibiotic medicine, take it as told by your health care provider. Do not stop taking the antibiotic even if you start to feel better. Activity  Return to your normal activities as told by your health care provider. Ask your health care provider what activities are safe for you.  Follow any other activity restrictions as instructed by your health care provider.  Do not lift anything that is heavier than 10 lb (4.5 kg), or the limit that you are told, until your health care provider says that it is safe. Driving  Do not drive until your health care provider approves.  Ask your health care provider if the medicine prescribed to you requires you to avoid driving or using heavy machinery. General instructions  Follow your health care provider's specific instructions for the type of catheter that you have.  Do not take baths, swim, or use a hot tub until your health care provider approves. Ask your health care provider if you may take showers.  Keep all follow-up visits as told by your health care provider. This is important. Contact a health care provider if:  You feel unusually weak or nauseous.  You have more redness, swelling, or pain at your incisions or around the area where your catheter is inserted.  Your catheter is not working properly.  You are unable to flush your catheter.   Get help right away if:  Your catheter develops a hole or it breaks.  You have pain or swelling when fluids or medicines are being given through the catheter.  Fluid is leaking from the catheter, under the dressing, or around the dressing.  Your catheter comes loose or gets pulled completely out. If this happens, press on your catheter site firmly with a clean cloth until you can get medical help.  You have swelling in  your shoulder, neck, chest, or face.  You have chest pain or difficulty breathing.  You feel dizzy or light-headed.  You have pus or a bad smell coming from your catheter site.  You have a fever or chills.  Your catheter site feels warm to the touch.  You develop bleeding from your catheter or your insertion site, and your bleeding does not stop. Summary  After the procedure, it is common to have mild redness, swelling, and pain around your catheter site.  Return to your normal activities as told by your health care provider. Ask your health care provider what activities are safe for you.  Follow your health care provider's specific instructions for the type of catheter that you have.  Keep your catheter site and your dressings clean and dry.  Contact a health care provider if your catheter is not working properly. Get help right away if you have chest pain, fever, or difficulty breathing. This information is not intended to replace advice given to you by your health care provider. Make sure you discuss any questions you have with your health care provider. Document Revised: 08/07/2018 Document Reviewed: 08/07/2018 Elsevier Patient Education  2020 [employer]  

## 2020-05-26 NOTE — Op Note (Addendum)
[facility]   OPERATIVE NOTE     PROCEDURE: 1. Exchange right IJ tunneled dialysis catheter over wire same access   PRE-OPERATIVE DIAGNOSIS: Complication of dialysis device with nonfunction of tunneled catheter; end-stage renal requiring hemodialysis  POST-OPERATIVE DIAGNOSIS: same as above  SURGEON: Katha Cabal, M.D.  ANESTHESIA: Conscious sedation was administered under my direct supervision by the interventional radiology RN.  IV Versed plus fentanyl were utilized. Continuous ECG, pulse oximetry and blood pressure was monitored throughout the entire procedure.  Conscious sedation was for a total of 39 minutes 17 seconds .  ESTIMATED BLOOD LOSS: Minimal  FINDING(S): 1.  Tips of the catheter in the right atrium on fluoroscopy 2.  No obvious pneumothorax on fluoroscopy  SPECIMEN(S):  None  Fluoroscopy time: 0.5 minutes  INDICATIONS:   KELBY ADELL II is a 49 y.o. male  presents with end stage renal disease.  Therefore, the patient requires a tunneled dialysis catheter placement.  The patient is informed of  the risks catheter placement include but are not limited to: bleeding, infection, central venous injury, pneumothorax, possible venous stenosis, possible malpositioning in the venous system, and possible infections related to long-term catheter presence.  The patient was aware of these risks and agreed to proceed.  DESCRIPTION: The patient was taken back to Special Procedure suite.  Prior to sedation, the patient was given IV antibiotics.  After obtaining adequate sedation, the patient was prepped and draped in the standard fashion for a chest or neck tunneled dialysis catheter placement.  Appropriate Time Out is called.   The the right neck and chest wall are then infiltrated with 1% Lidocaine with epinepherine.  A 19 cm tip to cuff catheter is then selected, opened on the back table and prepped.  The cuff of the existing catheter is localized.   Using both blunt and sharp dissection the cuff is then freed from surrounding attachments. The catheter is then controlled with a hemostat and transected above the level of the cuff.  Under fluoroscopy an Amplatz Super Stiff wire is introduced through the catheter and negotiated into the inferior vena cava. The remaining portion of the catheter is then removed without difficulty.  A new catheter is then threaded over the wire and advanced under fluoroscopy and positioned so that the catheter tip is in the proximal atrium.  Each port was tested by aspirating and flushing.  No resistance was noted.  Each port was then thoroughly flushed with heparinized saline.  The catheter was secured in placed with two interrupted stitches of 0 silk tied to the catheter.   Each port was then packed with concentrated heparin (10,000 Units/mL) at the manufacturer recommended volumes to each port.  Sterile caps were applied to each port.  On completion fluoroscopy, the tips of the catheter were in the right atrium, and there was no evidence of pneumothorax.  COMPLICATIONS: None  CONDITION: Margaretmary Dys 05/26/2020,2:00 PM [facility] Office: [phone]   05/26/2020, 2:00 PM

## 2020-06-05 ENCOUNTER — Other Ambulatory Visit (INDEPENDENT_AMBULATORY_CARE_PROVIDER_SITE_OTHER): Payer: Self-pay | Admitting: Vascular Surgery

## 2020-06-05 DIAGNOSIS — N186 End stage renal disease: Secondary | ICD-10-CM

## 2020-06-08 ENCOUNTER — Ambulatory Visit (INDEPENDENT_AMBULATORY_CARE_PROVIDER_SITE_OTHER): Payer: Medicare Other

## 2020-06-08 ENCOUNTER — Other Ambulatory Visit: Payer: Self-pay

## 2020-06-08 ENCOUNTER — Encounter (INDEPENDENT_AMBULATORY_CARE_PROVIDER_SITE_OTHER): Payer: Self-pay | Admitting: Vascular Surgery

## 2020-06-08 ENCOUNTER — Ambulatory Visit (INDEPENDENT_AMBULATORY_CARE_PROVIDER_SITE_OTHER): Payer: Medicare Other | Admitting: Vascular Surgery

## 2020-06-08 VITALS — BP 127/74 | HR 60 | Resp 16 | Ht 68.0 in | Wt 230.0 lb

## 2020-06-08 DIAGNOSIS — I10 Essential (primary) hypertension: Secondary | ICD-10-CM | POA: Diagnosis not present

## 2020-06-08 DIAGNOSIS — N186 End stage renal disease: Secondary | ICD-10-CM

## 2020-06-13 ENCOUNTER — Encounter (INDEPENDENT_AMBULATORY_CARE_PROVIDER_SITE_OTHER): Payer: Self-pay | Admitting: Vascular Surgery

## 2020-06-13 DIAGNOSIS — N186 End stage renal disease: Secondary | ICD-10-CM | POA: Insufficient documentation

## 2020-06-13 NOTE — Progress Notes (Signed)
MRN : [id number]  Joseph Gilmore is a 49 y.o. (1971/04/29) male who presents with chief complaint of  Chief Complaint  Patient presents with  . Follow-up    ultrasound  .  History of Present Illness:    The patient is seen for evaluation of dialysis access.  This is his first access.      Current access is via a catheter which is functioning poorly.  There have not been any episodes of catheter infection.  The patient denies amaurosis fugax or recent TIA symptoms. There are no recent neurological changes noted. The patient denies claudication symptoms or rest pain symptoms. The patient denies history of DVT, PE or superficial thrombophlebitis. The patient denies recent episodes of angina or shortness of breath.    Current Meds  Medication Sig  . B Complex-C-Folic Acid (RENA-VITE PO) Take 1 tablet by mouth daily.  . bisacodyl (DULCOLAX) 5 MG EC tablet Take 5 mg by mouth daily as needed for moderate constipation.  . dutasteride (AVODART) 0.5 MG capsule Take 0.5 mg by mouth daily.  . tamsulosin (FLOMAX) 0.4 MG CAPS capsule Take 0.4 mg by mouth daily.    Past Medical History:  Diagnosis Date  . Autism   . Murmur   . OCD (obsessive compulsive disorder)     Past Surgical History:  Procedure Laterality Date  . DIALYSIS/PERMA CATHETER INSERTION N/A 04/06/2020   Procedure: DIALYSIS/PERMA CATHETER INSERTION;  Surgeon: Algernon Huxley, MD;  Location: [facility];  Service: Cardiovascular;  Laterality: N/A;  . DIALYSIS/PERMA CATHETER INSERTION N/A 05/26/2020   Procedure: DIALYSIS/PERMA CATHETER INSERTION;  Surgeon: Katha Cabal, MD;  Location: [facility];  Service: Cardiovascular;  Laterality: N/A;    Social History Social History   Tobacco Use  . Smoking status: Never Smoker  . Smokeless tobacco: Never Used  Vaping Use  . Vaping Use: Never used  Substance Use Topics  . Alcohol use: Never  . Drug use: Never    Family History No family history on  file.  Allergies  Allergen Reactions  . Chlorhexidine     Blisters      REVIEW OF SYSTEMS (Negative unless checked)  Constitutional: [] Weight loss  [] Fever  [] Chills Cardiac: [] Chest pain   [] Chest pressure   [] Palpitations   [] Shortness of breath when laying flat   [] Shortness of breath with exertion. Vascular:  [] Pain in legs with walking   [] Pain in legs at rest  [] History of DVT   [] Phlebitis   [] Swelling in legs   [] Varicose veins   [] Non-healing ulcers Pulmonary:   [] Uses home oxygen   [] Productive cough   [] Hemoptysis   [] Wheeze  [] COPD   [] Asthma Neurologic:  [] Dizziness   [] Seizures   [] History of stroke   [] History of TIA  [] Aphasia   [] Vissual changes   [] Weakness or numbness in arm   [] Weakness or numbness in leg Musculoskeletal:   [] Joint swelling   [] Joint pain   [] Low back pain Hematologic:  [] Easy bruising  [] Easy bleeding   [] Hypercoagulable state   [] Anemic Gastrointestinal:  [] Diarrhea   [] Vomiting  [] Gastroesophageal reflux/heartburn   [] Difficulty swallowing. Genitourinary:  [x] Chronic kidney disease   [] Difficult urination  [] Frequent urination   [] Blood in urine Skin:  [] Rashes   [] Ulcers  Psychological:  [] History of anxiety   []  History of major depression.  Physical Examination  Vitals:   06/08/20 1557  BP: 127/74  Pulse: 60  Resp: 16  Weight: 230 lb (  104.3 kg)  Height: 5\' 8"  (1.727 m)   Body mass index is 34.97 kg/m. Gen: WD/WN, NAD Head: [place]/AT, No temporalis wasting.  Ear/Nose/Throat: Hearing grossly intact, nares w/o erythema or drainage Eyes: PER, EOMI, sclera nonicteric.  Neck: Supple, no large masses.   Pulmonary:  Good air movement, no audible wheezing bilaterally, no use of accessory muscles.  Cardiac: RRR, no JVD Vascular: right IJ tunneled catheter Vessel Right Left  Radial Palpable Palpable  Gastrointestinal: Non-distended. No guarding/no peritoneal signs.  Musculoskeletal: M/S 5/5 throughout.  No deformity or atrophy.  Neurologic:  CN 2-12 intact. Symmetrical.  Speech is fluent. Motor exam as listed above. Psychiatric: Judgment intact, Mood & affect appropriate for pt's clinical situation. Dermatologic: No rashes or ulcers noted.  No changes consistent with cellulitis.   CBC Lab Results  Component Value Date   WBC 8.0 04/24/2020   HGB 8.6 (L) 04/24/2020   HCT 27.6 (L) 04/24/2020   MCV 90.5 04/24/2020   PLT 130 (L) 04/24/2020    BMET    Component Value Date/Time   NA 140 04/24/2020 1219   K 3.6 05/26/2020 1235   CL 97 (L) 04/24/2020 1219   CO2 30 04/24/2020 1219   GLUCOSE 94 04/24/2020 1219   BUN 24 (H) 04/24/2020 1219   CREATININE 7.95 (H) 04/24/2020 1219   CALCIUM 6.8 (L) 04/24/2020 1219   GFRNONAA 7 (L) 04/24/2020 1219   GFRAA 8 (L) 04/24/2020 1219   CrCl cannot be calculated (Patient's most recent lab result is older than the maximum 21 days allowed.).  COAG Lab Results  Component Value Date   INR 1.2 04/06/2020    Radiology PERIPHERAL VASCULAR CATHETERIZATION  Result Date: 05/26/2020 See op note  VAS [place] UPPER EXTREMITY ARTERIAL DUPLEX  Result Date: 06/08/2020 UPPER EXTREMITY DUPLEX STUDY Performing Technologist: Blondell Reveal RT, RDMS, RVT  Examination Guidelines: A complete evaluation includes B-mode imaging, spectral Doppler, color Doppler, and power Doppler as needed of all accessible portions of each vessel. Bilateral testing is considered an integral part of a complete examination. Limited examinations for reoccurring indications may be performed as noted.  Right Pre-Dialysis Findings: +------------------+----------+-------------------+---------+------------------+ Location          PSV (cm/s)Intralum. Diam.    Waveform Comments                                       (cm)                                           +------------------+----------+-------------------+---------+------------------+ Brachial Antecub. 55        0.33               triphasicDual brachial      fossa                                                    system             +------------------+----------+-------------------+---------+------------------+ Radial Art at     63        0.22               triphasic  Wrist                                                                      +------------------+----------+-------------------+---------+------------------+ Ulnar Art at Wrist48        0.16               triphasic                   +------------------+----------+-------------------+---------+------------------+ Left Pre-Dialysis Findings: +------------------+----------+-------------------+---------+------------------+ Location          PSV (cm/s)Intralum. Diam.    Waveform Comments                                       (cm)                                           +------------------+----------+-------------------+---------+------------------+ Brachial Antecub. 83        0.35               triphasicDual brachial      fossa                                                   system             +------------------+----------+-------------------+---------+------------------+ Radial Art at     80        0.25               triphasic                   Wrist                                                                      +------------------+----------+-------------------+---------+------------------+ Ulnar Art at Wrist69        0.15               triphasic                   +------------------+----------+-------------------+---------+------------------+  Summary:  Right: No obstruction visualized in the right upper extremity. Left: No obstruction visualized in the left upper extremity. *See table(s) above for measurements and observations. Electronically signed by [name] MD on 06/08/2020 at 4:50:48 PM.    Final    VAS [place] UPPER EXT VEIN MAPPING (PRE-OP AVF)  Result Date: 06/08/2020 UPPER EXTREMITY VEIN MAPPING   Indications: Pre-access. Performing Technologist: Blondell Reveal RT, RDMS, RVT  Examination Guidelines: A complete evaluation includes B-mode imaging, spectral Doppler, color Doppler, and power Doppler as needed of all accessible portions of each vessel. Bilateral testing is considered an integral part of a complete examination. Limited examinations for reoccurring indications may be  performed as noted. +-----------------+-------------+----------+--------+ Right Cephalic   Diameter (cm)Depth (cm)Findings +-----------------+-------------+----------+--------+ Shoulder             0.22                        +-----------------+-------------+----------+--------+ Prox upper arm       0.20                        +-----------------+-------------+----------+--------+ Mid upper arm        0.19                        +-----------------+-------------+----------+--------+ Dist upper arm       0.20                        +-----------------+-------------+----------+--------+ Antecubital fossa    0.25                        +-----------------+-------------+----------+--------+ Prox forearm         0.15                        +-----------------+-------------+----------+--------+ Mid forearm          0.18                        +-----------------+-------------+----------+--------+ Dist forearm         0.14                        +-----------------+-------------+----------+--------+ +-----------------+-------------+----------+--------+ Right Basilic    Diameter (cm)Depth (cm)Findings +-----------------+-------------+----------+--------+ Prox upper arm       0.27                        +-----------------+-------------+----------+--------+ Mid upper arm        0.24                        +-----------------+-------------+----------+--------+ Dist upper arm       0.16                        +-----------------+-------------+----------+--------+ Antecubital fossa    0.13                         +-----------------+-------------+----------+--------+ Prox forearm         0.16                        +-----------------+-------------+----------+--------+ +-----------------+-------------+----------+--------+ Left Cephalic    Diameter (cm)Depth (cm)Findings +-----------------+-------------+----------+--------+ Shoulder             0.25                        +-----------------+-------------+----------+--------+ Prox upper arm       0.26                        +-----------------+-------------+----------+--------+ Mid upper arm        0.18                        +-----------------+-------------+----------+--------+ Dist upper arm       0.17                        +-----------------+-------------+----------+--------+  Antecubital fossa    0.19                        +-----------------+-------------+----------+--------+ Prox forearm         0.17                        +-----------------+-------------+----------+--------+ Mid forearm          0.14                        +-----------------+-------------+----------+--------+ Dist forearm         0.12                        +-----------------+-------------+----------+--------+ +-----------------+-------------+----------+--------+ Left Basilic     Diameter (cm)Depth (cm)Findings +-----------------+-------------+----------+--------+ Prox upper arm       0.36                        +-----------------+-------------+----------+--------+ Mid upper arm        0.26                        +-----------------+-------------+----------+--------+ Dist upper arm       0.37                        +-----------------+-------------+----------+--------+ Antecubital fossa    0.26                        +-----------------+-------------+----------+--------+ Prox forearm         0.16                        +-----------------+-------------+----------+--------+ Summary: Right: The cephalic and  basilic veins appear compressible. Left: The cephalic and basilic veins appear compressible. *See table(s) above for measurements and observations.  Diagnosing physician: [name] MD Electronically signed by [name] MD on 06/08/2020 at 4:50:50 PM.    Final      Assessment/Plan 1. End stage renal disease ([place]) Recommend:  At this time the patient does not have appropriate extremity access for dialysis  Patient should have a left forearm loop graft created.  The risks, benefits and alternative therapies were reviewed in detail with the patient.  All questions were answered.  The patient agrees to proceed with surgery.    2. Essential hypertension Continue antihypertensive medications as already ordered, these medications have been reviewed and there are no changes at this time.   [name], MD  06/13/2020 12:07 PM

## 2020-06-16 ENCOUNTER — Telehealth (INDEPENDENT_AMBULATORY_CARE_PROVIDER_SITE_OTHER): Payer: Self-pay

## 2020-06-16 NOTE — Telephone Encounter (Signed)
Spoke with the patient and he is scheduled with Dr. Delana Meyer for a left forearm loop graft on 07/15/20 at [facility]. Pre-op phone call on 07/03/20 between 8-1 pm and covid testing on 07/13/20 between 8-1 pm at the [facility]. Pre-surgical instructions were discussed and will be mailed.

## 2020-06-17 ENCOUNTER — Telehealth: Payer: Self-pay | Admitting: Physician Assistant

## 2020-06-17 NOTE — Telephone Encounter (Signed)
You saw this patient and he was given Flomax and Avodart while in the hospital by Dr. Jimmye Norman, In your notes you stated that he needed to continue taking them. He is now out and is asking if we can refill them? He uses the [facility], [facility]

## 2020-06-18 MED ORDER — DUTASTERIDE 0.5 MG PO CAPS: 0.5000 mg | ORAL_CAPSULE | Freq: Every day | ORAL | 11 refills | Status: DC

## 2020-06-18 MED ORDER — TAMSULOSIN HCL 0.4 MG PO CAPS: 0.4000 mg | ORAL_CAPSULE | Freq: Every day | ORAL | 11 refills | Status: DC

## 2020-06-18 NOTE — Telephone Encounter (Signed)
Notified patient as advised. Patient verbalized understanding.  

## 2020-06-18 NOTE — Telephone Encounter (Signed)
Yes, patient should continue both medications.  I have sent prescriptions for both to [facility].  Please notify patient.

## 2020-06-20 ENCOUNTER — Other Ambulatory Visit: Payer: Self-pay

## 2020-06-20 ENCOUNTER — Encounter: Payer: Self-pay | Admitting: Emergency Medicine

## 2020-06-20 ENCOUNTER — Emergency Department
Admission: EM | Admit: 2020-06-20 | Discharge: 2020-06-20 | Disposition: A | Discharge status: Home / Self Care | Payer: Medicare Other | Attending: Emergency Medicine | Admitting: Emergency Medicine

## 2020-06-20 DIAGNOSIS — I951 Orthostatic hypotension: Secondary | ICD-10-CM

## 2020-06-20 DIAGNOSIS — Z992 Dependence on renal dialysis: Secondary | ICD-10-CM | POA: Diagnosis not present

## 2020-06-20 DIAGNOSIS — I120 Hypertensive chronic kidney disease with stage 5 chronic kidney disease or end stage renal disease: Secondary | ICD-10-CM | POA: Insufficient documentation

## 2020-06-20 DIAGNOSIS — N186 End stage renal disease: Secondary | ICD-10-CM | POA: Diagnosis not present

## 2020-06-20 DIAGNOSIS — Z79899 Other long term (current) drug therapy: Secondary | ICD-10-CM | POA: Insufficient documentation

## 2020-06-20 DIAGNOSIS — I10 Essential (primary) hypertension: Secondary | ICD-10-CM

## 2020-06-20 DIAGNOSIS — R55 Syncope and collapse: Secondary | ICD-10-CM | POA: Diagnosis present

## 2020-06-20 LAB — CBC WITH DIFFERENTIAL/PLATELET
Abs Immature Granulocytes: 0.05 10*3/uL (ref 0.00–0.07)
Basophils Absolute: 0 10*3/uL (ref 0.0–0.1)
Basophils Relative: 0 %
Eosinophils Absolute: 0.2 10*3/uL (ref 0.0–0.5)
Eosinophils Relative: 2 %
HCT: 39.1 % (ref 39.0–52.0)
Hemoglobin: 12.5 g/dL — ABNORMAL LOW (ref 13.0–17.0)
Immature Granulocytes: 1 %
Lymphocytes Relative: 17 %
Lymphs Abs: 1.6 10*3/uL (ref 0.7–4.0)
MCH: 29.1 pg (ref 26.0–34.0)
MCHC: 32 g/dL (ref 30.0–36.0)
MCV: 91.1 fL (ref 80.0–100.0)
Monocytes Absolute: 0.9 10*3/uL (ref 0.1–1.0)
Monocytes Relative: 10 %
Neutro Abs: 6.6 10*3/uL (ref 1.7–7.7)
Neutrophils Relative %: 70 %
Platelets: 125 10*3/uL — ABNORMAL LOW (ref 150–400)
RBC: 4.29 MIL/uL (ref 4.22–5.81)
RDW: 17.8 % — ABNORMAL HIGH (ref 11.5–15.5)
WBC: 9.3 10*3/uL (ref 4.0–10.5)
nRBC: 0 % (ref 0.0–0.2)

## 2020-06-20 LAB — BASIC METABOLIC PANEL
Anion gap: 13 (ref 5–15)
BUN: 23 mg/dL — ABNORMAL HIGH (ref 6–20)
CO2: 25 mmol/L (ref 22–32)
Calcium: 7.2 mg/dL — ABNORMAL LOW (ref 8.9–10.3)
Chloride: 100 mmol/L (ref 98–111)
Creatinine, Ser: 9.09 mg/dL — ABNORMAL HIGH (ref 0.61–1.24)
GFR, Estimated: 7 mL/min — ABNORMAL LOW (ref >60)
Glucose, Bld: 107 mg/dL — ABNORMAL HIGH (ref 70–99)
Potassium: 3.6 mmol/L (ref 3.5–5.1)
Sodium: 138 mmol/L (ref 135–145)

## 2020-06-20 LAB — TROPONIN I (HIGH SENSITIVITY): Troponin I (High Sensitivity): 11 ng/L (ref <18)

## 2020-06-20 NOTE — ED Triage Notes (Signed)
Pt via EMS after dialysis. Pt got 4L taken off during dialysis. After the conclusion of the dialysis pt went to stand up and had a near syncopal episode. Per staff, pt's SBP was in the 108s. EMS got a SBP in the 150s. Staff at the dialysis clinic gave 1L of fluid. Pt has a hx of Autism. Pt is A&Ox4 and NAD. No complaints at this time.

## 2020-06-20 NOTE — ED Provider Notes (Addendum)
[facility] Emergency Department Provider Note  ____________________________________________  Time seen: Approximately 5:25 PM  I have reviewed the triage vital signs and the nursing notes.   HISTORY  Chief Complaint Near Syncope  Level 5 Caveat: Portions of the History and Physical including HPI and review of systems are unable to be completely obtained due to patient being a poor historian    HPI Joseph Gilmore is a 49 y.o. male with a history of autism spectrum disorder , hypertension, end-stage renal disease on hemodialysis who was in his usual state of health, went to schedule dialysis today, had 4 L of ultrafiltration and completed the entire session.  Upon standing up to leave, patient got dizzy and nearly passed out.  His blood pressure was checked and found to be in the 45G systolic.  Staff at dialysis give the patient 1 L of IV fluids, but sent him to the ED for assessment.  Patient denies feeling dizzy before standing up.  He reports that he feels normal now.  Denies any pain complaints before during or after the event.  States that he feels just fine and wants to go home.  Additional history obtained from father at bedside who notes that this happened at about 2:30 PM today.   Past Medical History:  Diagnosis Date  . Autism   . Murmur   . OCD (obsessive compulsive disorder)      Patient Active Problem List   Diagnosis Date Noted  . End stage renal disease ([place]) 06/13/2020  . Non-traumatic rhabdomyolysis   . Weakness   . Fever   . Enteritis   . Autism 04/03/2020  . AKI (acute kidney injury) ([place]) 04/03/2020  . Metabolic acidosis 25/63/8937  . Hypocalcemia 04/03/2020  . Anemia of chronic disease 04/03/2020  . Elevated LFTs 04/03/2020  . Elevated lipase 04/03/2020  . Essential hypertension 04/03/2020  . Hypertensive urgency 04/03/2020  . Vomiting 04/03/2020  . BPH (benign prostatic hyperplasia) 04/03/2020     Past Surgical  History:  Procedure Laterality Date  . DIALYSIS/PERMA CATHETER INSERTION N/A 04/06/2020   Procedure: DIALYSIS/PERMA CATHETER INSERTION;  Surgeon: Algernon Huxley, MD;  Location: [place] [facility];  Service: Cardiovascular;  Laterality: N/A;  . DIALYSIS/PERMA CATHETER INSERTION N/A 05/26/2020   Procedure: DIALYSIS/PERMA CATHETER INSERTION;  Surgeon: Katha Cabal, MD;  Location: [place] [facility];  Service: Cardiovascular;  Laterality: N/A;     Prior to Admission medications   Medication Sig Start Date End Date Taking? Authorizing Provider  amLODipine (NORVASC) 10 MG tablet Take 1 tablet (10 mg total) by mouth daily. 04/15/20 05/25/20  Wyvonnia Dusky, MD  B Complex-C-Folic Acid (RENA-VITE PO) Take 1 tablet by mouth daily.    [provider]  bisacodyl (DULCOLAX) 5 MG EC tablet Take 5 mg by mouth daily as needed for moderate constipation.    [provider]  dutasteride (AVODART) 0.5 MG capsule Take 1 capsule (0.5 mg total) by mouth daily. 06/18/20   Vaillancourt, Aldona Bar, PA-C  losartan (COZAAR) 100 MG tablet Take 1 tablet (100 mg total) by mouth daily. 04/15/20 05/25/20  Wyvonnia Dusky, MD  metoprolol tartrate (LOPRESSOR) 100 MG tablet Take 1 tablet (100 mg total) by mouth 2 (two) times daily. 04/14/20 05/25/20  Wyvonnia Dusky, MD  tamsulosin (FLOMAX) 0.4 MG CAPS capsule Take 1 capsule (0.4 mg total) by mouth daily. 06/18/20   Debroah Loop, PA-C     Allergies Chlorhexidine   History reviewed. No pertinent family  history.  Social History Social History   Tobacco Use  . Smoking status: Never Smoker  . Smokeless tobacco: Never Used  Vaping Use  . Vaping Use: Never used  Substance Use Topics  . Alcohol use: Never  . Drug use: Never    Review of Systems  Constitutional:   No fever or chills.  ENT:   No sore throat. No rhinorrhea. Cardiovascular:   No chest pain or syncope. Respiratory:   No dyspnea or cough. Gastrointestinal:    Negative for abdominal pain, vomiting and diarrhea.  Musculoskeletal:   Negative for focal pain or swelling All other systems reviewed and are negative except as documented above in ROS and HPI.  ____________________________________________   PHYSICAL EXAM:  VITAL SIGNS: ED Triage Vitals  Enc Vitals Group     BP 06/20/20 1606 (!) 155/75     Pulse Rate 06/20/20 1606 66     Resp 06/20/20 1606 (!) 25     Temp 06/20/20 1606 98.1 F (36.7 C)     Temp Source 06/20/20 1606 Oral     SpO2 06/20/20 1606 99 %     Weight 06/20/20 1602 220 lb (99.8 kg)     Height 06/20/20 1602 5\' 8"  (1.727 m)     Head Circumference --      Peak Flow --      Pain Score 06/20/20 1602 0     Pain Loc --      Pain Edu? --      Excl. in [place]? --     Vital signs reviewed, nursing assessments reviewed.   Constitutional:   Alert and oriented. Non-toxic appearance. Eyes:   Conjunctivae are normal. EOMI. PERRL. ENT      Head:   Normocephalic and atraumatic.      Nose:   Wearing a mask.      Mouth/Throat:   Wearing a mask.      Neck:   No meningismus. Full ROM. Hematological/Lymphatic/Immunilogical:   No cervical lymphadenopathy. Cardiovascular:   RRR. Symmetric bilateral radial and DP pulses.  No murmurs. Cap refill less than 2 seconds.  Tunneled catheter in right upper chest wall Respiratory:   Normal respiratory effort without tachypnea/retractions. Breath sounds are clear and equal bilaterally. No wheezes/rales/rhonchi. Gastrointestinal:   Soft and nontender. Non distended. There is no CVA tenderness.  No rebound, rigidity, or guarding.  Musculoskeletal:   Normal range of motion in all extremities. No joint effusions.  No lower extremity tenderness.  No edema. Neurologic:   Normal speech and language.  Motor grossly intact. No acute focal neurologic deficits are appreciated.  Skin:    Skin is warm, dry and intact. No rash noted.  No petechiae, purpura, or  bullae.  ____________________________________________    LABS (pertinent positives/negatives) (all labs ordered are listed, but only abnormal results are displayed) Labs Reviewed  BASIC METABOLIC PANEL - Abnormal; Notable for the following components:      Result Value   Glucose, Bld 107 (*)    BUN 23 (*)    Creatinine, Ser 9.09 (*)    Calcium 7.2 (*)    GFR, Estimated 7 (*)    All other components within normal limits  CBC WITH DIFFERENTIAL/PLATELET - Abnormal; Notable for the following components:   Hemoglobin 12.5 (*)    RDW 17.8 (*)    Platelets 125 (*)    All other components within normal limits  TROPONIN I (HIGH SENSITIVITY)   ____________________________________________   EKG  Interpreted by me Sinus rhythm  rate of 65, normal axis and intervals.  Normal QRS ST segments and T waves.  No ischemic changes.  No evidence of dysrhythmia  ____________________________________________    RADIOLOGY  No results found.  ____________________________________________   PROCEDURES Procedures  ____________________________________________    CLINICAL IMPRESSION / ASSESSMENT AND PLAN / ED COURSE  Medications ordered in the ED: Medications - No data to display  Pertinent labs & imaging results that were available during my care of the patient were reviewed by me and considered in my medical decision making (see chart for details).  Joseph Gilmore was evaluated in Emergency Department on 06/20/2020 for the symptoms described in the history of present illness. He was evaluated in the context of the global COVID-19 pandemic, which necessitated consideration that the patient might be at risk for infection with the SARS-CoV-2 virus that causes COVID-19. Institutional protocols and algorithms that pertain to the evaluation of patients at risk for COVID-19 are in a state of rapid change based on information released by regulatory bodies including the CDC and federal and state  organizations. These policies and algorithms were followed during the patient's care in the ED.   Patient sent to ED after near syncope and orthostatic hypotension after having 4 L of fluid removed during the dialysis session.  He was given fluids at dialysis and then sent to the ED for evaluation.  He is completely asymptomatic.  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  Doubt stroke intracranial hemorrhage ACS PE dissection AAA carditis infection or sepsis.  Orthostatics in the ED are normal.  Screening lab panel unremarkable.  He stable for discharge.  Given the complete absence of any potential cardiac symptoms, I do not think serial troponins or additional cardiac work-up is indicated.      ____________________________________________   FINAL CLINICAL IMPRESSION(S) / ED DIAGNOSES    Final diagnoses:  Orthostatic hypotension  ESRD on hemodialysis [facility])  Essential hypertension     ED Discharge Orders    None      Portions of this note were generated with dragon dictation software. Dictation errors may occur despite best attempts at proofreading.   Carrie Mew, MD 06/20/20 [place]    Carrie Mew, MD 06/20/20 [phone]

## 2020-06-20 NOTE — Discharge Instructions (Signed)
Your lab tests and vital signs in the ED were normal. Please continue to follow your dialysis diet and continue dialysis as scheduled.

## 2020-07-02 ENCOUNTER — Other Ambulatory Visit (INDEPENDENT_AMBULATORY_CARE_PROVIDER_SITE_OTHER): Payer: Self-pay | Admitting: Nurse Practitioner

## 2020-07-03 ENCOUNTER — Encounter
Admission: RE | Admit: 2020-07-03 | Discharge: 2020-07-03 | Disposition: A | Discharge status: Home / Self Care | Payer: Medicare Other | Source: Ambulatory Visit | Attending: Vascular Surgery | Admitting: Vascular Surgery

## 2020-07-03 ENCOUNTER — Other Ambulatory Visit: Payer: Self-pay

## 2020-07-03 NOTE — Patient Instructions (Addendum)
Your procedure is scheduled on:07-15-20 [facility] Report to the Registration Desk on the 1st floor of the [facility]. To find out your arrival time, please call [phone] between 1PM - 3PM on:07-04-20 TUESDAY  REMEMBER: Instructions that are not followed completely may result in serious medical risk, up to and including death; or upon the discretion of your surgeon and anesthesiologist your surgery may need to be rescheduled.  Do not eat food after midnight the night before surgery.  No gum chewing, lozengers or hard candies.  You may however, drink CLEAR liquids up to 2 hours before you are scheduled to arrive for your surgery. Do not drink anything within 2 hours of your scheduled arrival time.  Clear liquids include: - water  - apple juice without pulp - gatorade (not RED, PURPLE, OR BLUE) - black coffee or tea (Do NOT add milk or creamers to the coffee or tea) Do NOT drink anything that is not on this list.  TAKE THESE MEDICATIONS THE MORNING OF SURGERY WITH A SIP OF WATER: -NORVASC (AMLODIPINE) -FLOMAX (TAMSULOSIN) -AVODART (DUTASTERIDE) -METOPROLOL (LOPRESSOR)   One week prior to surgery: Stop Anti-inflammatories (NSAIDS) such as Advil, Aleve, Ibuprofen, Motrin, Naproxen, Naprosyn and Aspirin based products such as Excedrin, Goodys Powder, BC Powder-OK TO TAKE TYLENOL IF  NEEDED  Stop ANY OVER THE COUNTER supplements until after surgery. (However, you may continue taking Vitamin D and Renavite .)  No Alcohol for 24 hours before or after surgery.  No Smoking including e-cigarettes for 24 hours prior to surgery.  No chewable tobacco products for at least 6 hours prior to surgery.  No nicotine patches on the day of surgery.  Do not use any "recreational" drugs for at least a week prior to your surgery.  Please be advised that the combination of cocaine and anesthesia may have negative outcomes, up to and including death. If you test positive for cocaine, your surgery  will be cancelled.  On the morning of surgery brush your teeth with toothpaste and water, you may rinse your mouth with mouthwash if you wish. Do not swallow any toothpaste or mouthwash.  Do not wear jewelry, make-up, hairpins, clips or nail polish.  Do not wear lotions, powders, or perfumes.   Do not shave 48 hours prior to surgery.   Contact lenses, hearing aids and dentures may not be worn into surgery.  Do not bring valuables to the hospital. [facility] is not responsible for any missing/lost belongings or valuables.   Notify your doctor if there is any change in your medical condition (cold, fever, infection).  Wear comfortable clothing (specific to your surgery type) to the hospital.  Plan for stool softeners for home use; pain medications have a tendency to cause constipation. You can also help prevent constipation by eating foods high in fiber such as fruits and vegetables and drinking plenty of fluids as your diet allows.  After surgery, you can help prevent lung complications by doing breathing exercises.  Take deep breaths and cough every 1-2 hours. Your doctor may order a device called an Incentive Spirometer to help you take deep breaths. When coughing or sneezing, hold a pillow firmly against your incision with both hands. This is called "splinting." Doing this helps protect your incision. It also decreases belly discomfort.  If you are being admitted to the hospital overnight, leave your suitcase in the car. After surgery it may be brought to your room.  If you are being discharged the day of surgery, you will  not be allowed to drive home. You will need a responsible adult (18 years or older) to drive you home and stay with you that night.   If you are taking public transportation, you will need to have a responsible adult (18 years or older) with you. Please confirm with your physician that it is acceptable to use public transportation.   Please call the  [place] [facility] at [phone] if you have any questions about these instructions.  Visitation Policy:  Patients undergoing a surgery or procedure may have one family member or support person with them as long as that person is not COVID-19 positive or experiencing its symptoms.  That person may remain in the waiting area during the procedure.  Inpatient Visitation Update:   In an effort to ensure the safety of our team members and our patients, we are implementing a change to our visitation policy:  Effective Monday, Aug. 9, at 7 a.m., inpatients will be allowed one support person.  o The support person may change daily.  o The support person must pass our screening, gel in and out, and wear a mask at all times, including in the patient's room.  o Patients must also wear a mask when staff or their support person are in the room.  o Masking is required regardless of vaccination status.  Systemwide, no visitors 17 or younger.

## 2020-07-03 NOTE — Pre-Procedure Instructions (Signed)
EKG  Interpreted by me Sinus rhythm rate of 65, normal axis and intervals.  Normal QRS ST segments and T waves.  No ischemic changes.  No evidence of dysrhythmia  ____________________________________________    RADIOLOGY  No results found.  ____________________________________________   PROCEDURES Procedures  ____________________________________________    CLINICAL IMPRESSION / ASSESSMENT AND PLAN / ED COURSE  Medications ordered in the ED: Medications - No data to display  Pertinent labs & imaging results that were available during my care of the patient were reviewed by me and considered in my medical decision making (see chart for details).  Joseph Gilmore was evaluated in Emergency Department on 06/20/2020 for the symptoms described in the history of present illness. He was evaluated in the context of the global COVID-19 pandemic, which necessitated consideration that the patient might be at risk for infection with the SARS-CoV-2 virus that causes COVID-19. Institutional protocols and algorithms that pertain to the evaluation of patients at risk for COVID-19 are in a state of rapid change based on information released by regulatory bodies including the CDC and federal and state organizations. These policies and algorithms were followed during the patient's care in the ED.  Patient sent to ED after near syncope and orthostatic hypotension after having 4 L of fluid removed during the dialysis session.  He was given fluids at dialysis and then sent to the ED for evaluation.  He is completely asymptomatic.  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  Doubt stroke intracranial hemorrhage ACS PE dissection AAA carditis infection or sepsis.  Orthostatics in the ED are normal.  Screening lab panel unremarkable.  He stable for discharge.  Given the complete absence of any potential cardiac symptoms, I do not think serial troponins or additional  cardiac work-up is indicated.    ____________________________________________   FINAL CLINICAL IMPRESSION(S) / ED DIAGNOSES    Final diagnoses:  Orthostatic hypotension  ESRD on hemodialysis [facility])  Essential hypertension        ED Discharge Orders    None      Portions of this note were generated with dragon dictation software. Dictation errors may occur despite best attempts at proofreading.   Carrie Mew, MD 06/20/20 [place], MD 06/20/20 1731     Electronically signed by Carrie Mew, MD at 06/20/2020 5:30 PM  Electronically signed by Carrie Mew, MD at 06/20/2020 5:31 PM  ED on 06/20/2020   ED on 06/20/2020     Revision History     Detailed Report    Note shared with patient

## 2020-07-08 ENCOUNTER — Other Ambulatory Visit
Admission: RE | Admit: 2020-07-08 | Discharge: 2020-07-08 | Disposition: A | Discharge status: Home / Self Care | Payer: Medicare Other | Source: Ambulatory Visit | Attending: Vascular Surgery | Admitting: Vascular Surgery

## 2020-07-08 ENCOUNTER — Encounter (INDEPENDENT_AMBULATORY_CARE_PROVIDER_SITE_OTHER): Payer: Self-pay | Admitting: Nurse Practitioner

## 2020-07-08 ENCOUNTER — Other Ambulatory Visit (INDEPENDENT_AMBULATORY_CARE_PROVIDER_SITE_OTHER): Payer: Self-pay | Admitting: Nurse Practitioner

## 2020-07-08 ENCOUNTER — Other Ambulatory Visit: Payer: Self-pay

## 2020-07-08 ENCOUNTER — Telehealth (INDEPENDENT_AMBULATORY_CARE_PROVIDER_SITE_OTHER): Payer: Self-pay

## 2020-07-08 DIAGNOSIS — Z01812 Encounter for preprocedural laboratory examination: Secondary | ICD-10-CM | POA: Diagnosis present

## 2020-07-08 DIAGNOSIS — Z20822 Contact with and (suspected) exposure to covid-19: Secondary | ICD-10-CM | POA: Diagnosis not present

## 2020-07-08 NOTE — Telephone Encounter (Signed)
Spoke with the nurse and [facility] and the patient is scheduled for a permcath exchange with Dr. Lucky Cowboy on 07/09/20 with a 11:00 am arrival time to the [facility]. Covid testing today 07/08/20 before 11:00 am at the [place]. Pre-procedure instructions were discussed and will be faxed to [facility] attention [place].

## 2020-07-09 ENCOUNTER — Encounter
Admission: RE | Disposition: A | Discharge status: Home / Self Care | Payer: Self-pay | Source: Home / Self Care | Attending: Vascular Surgery

## 2020-07-09 ENCOUNTER — Ambulatory Visit
Admission: RE | Admit: 2020-07-09 | Discharge: 2020-07-09 | Disposition: A | Discharge status: Home / Self Care | Payer: Medicare Other | Attending: Vascular Surgery | Admitting: Vascular Surgery

## 2020-07-09 ENCOUNTER — Encounter: Payer: Self-pay | Admitting: Vascular Surgery

## 2020-07-09 ENCOUNTER — Other Ambulatory Visit: Payer: Self-pay

## 2020-07-09 DIAGNOSIS — I120 Hypertensive chronic kidney disease with stage 5 chronic kidney disease or end stage renal disease: Secondary | ICD-10-CM | POA: Diagnosis not present

## 2020-07-09 DIAGNOSIS — Z992 Dependence on renal dialysis: Secondary | ICD-10-CM | POA: Diagnosis not present

## 2020-07-09 DIAGNOSIS — Z4901 Encounter for fitting and adjustment of extracorporeal dialysis catheter: Secondary | ICD-10-CM | POA: Insufficient documentation

## 2020-07-09 DIAGNOSIS — N186 End stage renal disease: Secondary | ICD-10-CM

## 2020-07-09 DIAGNOSIS — T82898A Other specified complication of vascular prosthetic devices, implants and grafts, initial encounter: Secondary | ICD-10-CM

## 2020-07-09 HISTORY — PX: DIALYSIS/PERMA CATHETER INSERTION: CATH118288

## 2020-07-09 LAB — SARS CORONAVIRUS 2 (TAT 6-24 HRS): SARS Coronavirus 2: NEGATIVE

## 2020-07-09 SURGERY — DIALYSIS/PERMA CATHETER INSERTION
Anesthesia: Moderate Sedation

## 2020-07-09 MED ORDER — FENTANYL CITRATE (PF) 100 MCG/2ML IJ SOLN
INTRAMUSCULAR | Status: DC | PRN
  Administered 2020-07-09: 25 ug via INTRAVENOUS

## 2020-07-09 MED ORDER — SODIUM CHLORIDE 0.9 % IV SOLN: INTRAVENOUS | Status: DC

## 2020-07-09 MED ORDER — MIDAZOLAM HCL 2 MG/2ML IJ SOLN
INTRAMUSCULAR | Status: DC | PRN
  Administered 2020-07-09: 1 mg via INTRAVENOUS

## 2020-07-09 MED ORDER — DIPHENHYDRAMINE HCL 50 MG/ML IJ SOLN: 50.0000 mg | Freq: Once | INTRAMUSCULAR | Status: DC | PRN

## 2020-07-09 MED ORDER — MIDAZOLAM HCL 2 MG/ML PO SYRP: 8.0000 mg | ORAL_SOLUTION | Freq: Once | ORAL | Status: DC | PRN

## 2020-07-09 MED ORDER — METHYLPREDNISOLONE SODIUM SUCC 125 MG IJ SOLR: 125.0000 mg | Freq: Once | INTRAMUSCULAR | Status: DC | PRN

## 2020-07-09 MED ORDER — HYDROMORPHONE HCL 1 MG/ML IJ SOLN: 1.0000 mg | Freq: Once | INTRAMUSCULAR | Status: DC | PRN

## 2020-07-09 MED ORDER — CEFAZOLIN SODIUM-DEXTROSE 1-4 GM/50ML-% IV SOLN: 1.0000 g | INTRAVENOUS | Status: DC

## 2020-07-09 MED ORDER — FAMOTIDINE 20 MG PO TABS: 40.0000 mg | ORAL_TABLET | Freq: Once | ORAL | Status: DC | PRN

## 2020-07-09 MED ORDER — ONDANSETRON HCL 4 MG/2ML IJ SOLN: 4.0000 mg | Freq: Four times a day (QID) | INTRAMUSCULAR | Status: DC | PRN

## 2020-07-09 MED ORDER — MIDAZOLAM HCL 5 MG/5ML IJ SOLN
INTRAMUSCULAR | Status: AC
  Filled 2020-07-09: qty 5

## 2020-07-09 MED ORDER — CEFAZOLIN SODIUM-DEXTROSE 1-4 GM/50ML-% IV SOLN
INTRAVENOUS | Status: AC
  Administered 2020-07-09: 1 g via INTRAVENOUS
  Filled 2020-07-09: qty 50

## 2020-07-09 MED ORDER — HEPARIN SODIUM (PORCINE) 10000 UNIT/ML IJ SOLN
INTRAMUSCULAR | Status: AC
  Filled 2020-07-09: qty 1

## 2020-07-09 MED ORDER — FENTANYL CITRATE (PF) 100 MCG/2ML IJ SOLN
INTRAMUSCULAR | Status: AC
  Filled 2020-07-09: qty 2

## 2020-07-09 MED ORDER — CEFAZOLIN SODIUM-DEXTROSE 1-4 GM/50ML-% IV SOLN: 1.0000 g | Freq: Once | INTRAVENOUS | Status: AC

## 2020-07-09 SURGICAL SUPPLY — 9 items
ADH SKN CLS APL DERMABOND .7 (GAUZE/BANDAGES/DRESSINGS) ×1
CATH PALIN MAXID VT KIT 19CM (CATHETERS) ×2 IMPLANT
DERMABOND ADVANCED (GAUZE/BANDAGES/DRESSINGS) ×2
DERMABOND ADVANCED .7 DNX12 (GAUZE/BANDAGES/DRESSINGS) IMPLANT
GUIDEWIRE SUPER STIFF .035X180 (WIRE) ×2 IMPLANT
PACK ANGIOGRAPHY (CUSTOM PROCEDURE TRAY) ×2 IMPLANT
SUT MNCRL AB 4-0 PS2 18 (SUTURE) ×2 IMPLANT
SUT PROLENE 0 CT 1 30 (SUTURE) ×2 IMPLANT
TOWEL OR 17X26 4PK STRL BLUE (TOWEL DISPOSABLE) ×2 IMPLANT

## 2020-07-09 NOTE — Op Note (Signed)
OPERATIVE NOTE    PRE-OPERATIVE DIAGNOSIS: 1. ESRD 2. Non-functional permcath  POST-OPERATIVE DIAGNOSIS: same as above  PROCEDURE: 1. Fluoroscopic guidance for placement of catheter 2. Placement of a 19 cm tip to cuff tunneled hemodialysis catheter via the right internal jugular vein and removal of previous catheter  SURGEON: [name], MD  ANESTHESIA:  Local with moderate conscious sedation for 14 minutes using 1 mg of Versed and 25 mcg of Fentanyl  ESTIMATED BLOOD LOSS: 3 cc  FINDING(S): none  SPECIMEN(S):  None  INDICATIONS:   Patient is a 49 y.o.male who presents with non-functional dialysis catheter and ESRD.  The patient needs long term dialysis access for their ESRD, and a Permcath is necessary.  Risks and benefits are discussed and informed consent is obtained.    DESCRIPTION: After obtaining full informed written consent, the patient was brought back to the vascular suite. The patient received moderate conscious sedation during a face-to-face encounter with me present throughout the entire procedure and supervising the RN monitoring the vital signs, pulse oximetry, telemetry, and mental status throughout the entire procedure. The patient's existing catheter, right neck and chest were sterilely prepped and draped in a sterile surgical field was created.  The existing catheter was dissected free from the fibrous sheath securing the cuff with hemostats and blunt dissection.  A wire was placed. The existing catheter was then removed and the wire used to keep venous access. I selected a 19 cm tip to cuff tunneled dialysis catheter.  Using fluoroscopic guidance the catheter tips were parked in the right atrium. The appropriate distal connectors were placed. It withdrew blood well and flushed easily with heparinized saline and a concentrated heparin solution was then placed. It was secured to the chest wall with 2 Prolene sutures. A 4-0 Monocryl pursestring suture was placed around the  exit site. Sterile dressings were placed. The patient tolerated the procedure well and was taken to the recovery room in stable condition.  COMPLICATIONS: None  CONDITION: Stable  [name] 07/09/2020 12:38 PM   This note was created with Dragon Medical transcription system. Any errors in dictation are purely unintentional.

## 2020-07-09 NOTE — H&P (Signed)
[facility] Admission History & Physical  MRN : [id number]  Joseph Gilmore is a 49 y.o. ([birth year]) male who presents with chief complaint of scheduled PermCath exchange.  History of Present Illness:  I am asked to evaluate the patient by the dialysis center. The patient was sent here because they were unable to achieve adequate dialysis yesterday. Furthermore the Center states they were unable to aspirate either lumen of the catheter yesterday.  This problem has been getting worse for about one week. The patient is unaware of any other change.   Patient denies pain or tenderness overlying the access.  There is no pain with dialysis.  Patient denies fevers or shaking chills while on dialysis.    Current Facility-Administered Medications  Medication Dose Route Frequency Provider Last Rate Last Admin  . 0.9 %  sodium chloride infusion   Intravenous Continuous Kris Hartmann, NP      . ceFAZolin (ANCEF) IVPB 1 g/50 mL premix  1 g Intravenous Once Kris Hartmann, NP      . diphenhydrAMINE (BENADRYL) injection 50 mg  50 mg Intravenous Once PRN Kris Hartmann, NP      . famotidine (PEPCID) tablet 40 mg  40 mg Oral Once PRN Kris Hartmann, NP      . HYDROmorphone (DILAUDID) injection 1 mg  1 mg Intravenous Once PRN Eulogio Ditch E, NP      . methylPREDNISolone sodium succinate (SOLU-MEDROL) 125 mg/2 mL injection 125 mg  125 mg Intravenous Once PRN Eulogio Ditch E, NP      . midazolam (VERSED) 2 MG/ML syrup 8 mg  8 mg Oral Once PRN Kris Hartmann, NP      . ondansetron (ZOFRAN) injection 4 mg  4 mg Intravenous Q6H PRN Kris Hartmann, NP       Past Surgical History:  Procedure Laterality Date  . DIALYSIS/PERMA CATHETER INSERTION N/A 04/06/2020   Procedure: DIALYSIS/PERMA CATHETER INSERTION;  Surgeon: Algernon Huxley, MD;  Location: [facility];  Service: Cardiovascular;  Laterality: N/A;  . DIALYSIS/PERMA CATHETER INSERTION N/A 05/26/2020   Procedure:  DIALYSIS/PERMA CATHETER INSERTION;  Surgeon: Katha Cabal, MD;  Location: [facility];  Service: Cardiovascular;  Laterality: N/A;   Social History Social History   Tobacco Use  . Smoking status: Never Smoker  . Smokeless tobacco: Never Used  Vaping Use  . Vaping Use: Never used  Substance Use Topics  . Alcohol use: Never  . Drug use: Never   Family History No family history on file.   No family history of bleeding or clotting disorders, autoimmune disease or porphyria.  Allergies  Allergen Reactions  . Chlorhexidine     Blisters    REVIEW OF SYSTEMS (Negative unless checked)  Constitutional: [] Weight loss  [] Fever  [] Chills Cardiac: [] Chest pain   [] Chest pressure   [] Palpitations   [] Shortness of breath when laying flat   [] Shortness of breath at rest   [x] Shortness of breath with exertion. Vascular:  [] Pain in legs with walking   [] Pain in legs at rest   [] Pain in legs when laying flat   [] Claudication   [] Pain in feet when walking  [] Pain in feet at rest  [] Pain in feet when laying flat   [] History of DVT   [] Phlebitis   [] Swelling in legs   [] Varicose veins   [] Non-healing ulcers Pulmonary:   [] Uses home oxygen   [] Productive cough   [] Hemoptysis   [] Wheeze  []   COPD   [] Asthma Neurologic:  [] Dizziness  [] Blackouts   [] Seizures   [] History of stroke   [] History of TIA  [] Aphasia   [] Temporary blindness   [] Dysphagia   [] Weakness or numbness in arms   [] Weakness or numbness in legs Musculoskeletal:  [] Arthritis   [] Joint swelling   [] Joint pain   [] Low back pain Hematologic:  [] Easy bruising  [] Easy bleeding   [] Hypercoagulable state   [] Anemic  [] Hepatitis Gastrointestinal:  [] Blood in stool   [] Vomiting blood  [] Gastroesophageal reflux/heartburn   [] Difficulty swallowing. Genitourinary:  [x] Chronic kidney disease   [] Difficult urination  [] Frequent urination  [] Burning with urination   [] Blood in urine Skin:  [] Rashes   [] Ulcers   [] Wounds Psychological:   [] History of anxiety   []  History of major depression.  Physical Examination  Vitals:   07/09/20 1102  BP: 137/79  Pulse: (!) 56  Resp: 20  Temp: 98 F (36.7 C)  TempSrc: Oral  Weight: 94.3 kg  Height: 5\' 8"  (1.727 m)   Body mass index is 31.63 kg/m. Gen: WD/WN, NAD Head: [place]/AT, No temporalis wasting. Prominent temp pulse not noted. Ear/Nose/Throat: Hearing grossly intact, nares w/o erythema or drainage, oropharynx w/o Erythema/Exudate,  Eyes: Conjunctiva clear, sclera non-icteric Neck: Trachea midline.  No JVD.  Pulmonary:  Good air movement, respirations not labored, no use of accessory muscles.  Cardiac: RRR, normal S1, S2.  Vascular: right tunneled catheter without tenderness or drainage Vessel Right Left  Radial Palpable Palpable   Gastrointestinal: soft, non-tender/non-distended. No guarding/reflex.  Musculoskeletal: M/S 5/5 throughout.  Extremities without ischemic changes.  No deformity or atrophy.  Neurologic: Sensation grossly intact in extremities.  Symmetrical.  Speech is fluent. Motor exam as listed above. Psychiatric: Judgment intact, Mood & affect appropriate for pt's clinical situation. Dermatologic: No rashes or ulcers noted.  No cellulitis or open wounds. Lymph : No Cervical, Axillary, or Inguinal lymphadenopathy.  CBC Lab Results  Component Value Date   WBC 9.3 06/20/2020   HGB 12.5 (L) 06/20/2020   HCT 39.1 06/20/2020   MCV 91.1 06/20/2020   PLT 125 (L) 06/20/2020   BMET    Component Value Date/Time   NA 138 06/20/2020 1618   K 3.6 06/20/2020 1618   CL 100 06/20/2020 1618   CO2 25 06/20/2020 1618   GLUCOSE 107 (H) 06/20/2020 1618   BUN 23 (H) 06/20/2020 1618   CREATININE 9.09 (H) 06/20/2020 1618   CALCIUM 7.2 (L) 06/20/2020 1618   GFRNONAA 7 (L) 06/20/2020 1618   GFRAA 8 (L) 04/24/2020 1219   Estimated Creatinine Clearance: 11.1 mL/min (A) (by C-G formula based on SCr of 9.09 mg/dL (H)).  COAG Lab Results  Component Value Date   INR  1.2 04/06/2020   Radiology No results found.  Assessment/Plan 1.  Complication dialysis device with thrombosis AV access:  Patient's tunneled catheter is thrombosed. The patient will undergo exchange of the catheter same venous access using interventional techniques. The risks and benefits were described to the patient.  All questions were answered.  The patient agrees to proceed with intervention.   2.  End-stage renal disease requiring hemodialysis:  Patient will continue dialysis therapy without further interruption if a successful exchange is not achieved then new site will be found for tunneled catheter placement. Dialysis has already been arranged since the patient missed their previous session  3.  Hypertension:  Patient will continue medical management; nephrology is following no changes in oral medications.  Discussed Dr. Mayme Genta,  PA-C  07/09/2020 11:56 AM

## 2020-07-09 NOTE — Discharge Instructions (Signed)
Moderate Conscious Sedation, Adult Sedation is the use of medicines to promote relaxation and relieve discomfort and anxiety. Moderate conscious sedation is a type of sedation. Under moderate conscious sedation, you are less alert than normal, but you are still able to respond to instructions, touch, or both. Moderate conscious sedation is used during short medical and dental procedures. It is milder than deep sedation, which is a type of sedation under which you cannot be easily woken up. It is also milder than general anesthesia, which is the use of medicines to make you unconscious. Moderate conscious sedation allows you to return to your regular activities sooner. Tell a health care provider about:  Any allergies you have.  All medicines you are taking, including vitamins, herbs, eye drops, creams, and over-the-counter medicines.  Use of steroids (by mouth or creams).  Any problems you or family members have had with sedatives and anesthetic medicines.  Any blood disorders you have.  Any surgeries you have had.  Any medical conditions you have, such as sleep apnea.  Whether you are pregnant or may be pregnant.  Any use of cigarettes, alcohol, marijuana, or street drugs. What are the risks? Generally, this is a safe procedure. However, problems may occur, including:  Getting too much medicine (oversedation).  Nausea.  Allergic reaction to medicines.  Trouble breathing. If this happens, a breathing tube may be used to help with breathing. It will be removed when you are awake and breathing on your own.  Heart trouble.  Lung trouble. What happens before the procedure? Staying hydrated Follow instructions from your health care provider about hydration, which may include:  Up to 2 hours before the procedure - you may continue to drink clear liquids, such as water, clear fruit juice, black coffee, and plain tea. Eating and drinking restrictions Follow instructions from your  health care provider about eating and drinking, which may include:  8 hours before the procedure - stop eating heavy meals or foods such as meat, fried foods, or fatty foods.  6 hours before the procedure - stop eating light meals or foods, such as toast or cereal.  6 hours before the procedure - stop drinking milk or drinks that contain milk.  2 hours before the procedure - stop drinking clear liquids. Medicine Ask your health care provider about:  Changing or stopping your regular medicines. This is especially important if you are taking diabetes medicines or blood thinners.  Taking medicines such as aspirin and ibuprofen. These medicines can thin your blood. Do not take these medicines before your procedure if your health care provider instructs you not to.  Tests and exams  You will have a physical exam.  You may have blood tests done to show: ? How well your kidneys and liver are working. ? How well your blood can clot. General instructions  Plan to have someone take you home from the hospital or clinic.  If you will be going home right after the procedure, plan to have someone with you for 24 hours. What happens during the procedure?  An IV tube will be inserted into one of your veins.  Medicine to help you relax (sedative) will be given through the IV tube.  The medical or dental procedure will be performed. What happens after the procedure?  Your blood pressure, heart rate, breathing rate, and blood oxygen level will be monitored often until the medicines you were given have worn off.  Do not drive for 24 hours. This information is not   intended to replace advice given to you by your health care provider. Make sure you discuss any questions you have with your health care provider. Document Revised: 07/28/2017 Document Reviewed: 12/05/2015 Elsevier Patient Education  2020 [place] Dialysis Access Placement Central line dialysis access placement is  a procedure to place a long, thin, plastic tube (catheter) into a vein in the neck and move it to a larger central vein near the heart. You may have this procedure if you have severe kidney disease and need treatment to filter your blood and remove extra fluid from your body (hemodialysis). Your health care provider may use this type of catheter placement if it is the only way to access your circulatory system for dialysis. Your health care provider may use this catheter temporarily until more permanent blood vessel access is possible. Tell a health care provider about:  Any allergies you have.  Any reactions you have had to X-ray dyes.  All medicines you are taking, including vitamins, herbs, eye drops, creams, and over-the-counter medicines.  Any problems you or family members have had with anesthetic medicines.  Any blood disorders you have.  Any surgeries you have had.  Any medical conditions you have.  Whether you are pregnant or may be pregnant. What are the risks? Generally, this is a safe procedure. However, problems may occur, including:  Infection.  Bleeding.  Allergic reactions to medicines or dyes.  Damage to other structures or organs in the neck or chest.  A blood clot that forms in the neck and travels to the heart.  A blood clot that blocks the catheter.  Catheter failure.  A collapsed lung (pneumothorax). What happens before the procedure?  Follow instructions from your health care provider about eating or drinking restrictions.  Ask your health care provider about changing or stopping your regular medicines. This is especially important if you are taking diabetes medicines or blood thinners.  Plan to have someone take you home after the procedure.  If you will be going home right after the procedure, plan to have someone with you for 24 hours. What happens during the procedure?  To reduce your risk of infection: ? Your health care team will wash or  sanitize their hands. ? Your skin will be washed with soap.  An IV tube will be inserted into one of your veins.  You will be given one or more of the following: ? A medicine to help you relax (sedative). ? A medicine to numb the area (local anesthetic).  You may be given antibiotic medicine through your IV tube.  A small incision will be made in your lower neck.  Ultrasound or a type of X-ray (fluoroscopy) will be used to find your vein.  A needle will be placed into your vein.  A guide wire may be placed through the needle and moved toward the central vein near your heart.  The needle will be removed, and the catheter will be guided into the central vein near your heart. Ultrasound or fluoroscopy may be used to guide the wire and catheter into place.  The guide wire will be removed after the catheter is in place.  The incision will be closed, and the catheter will be secured in place with stitches (sutures).  A bandage (dressing) will be placed over the incision and the catheter. The procedure may vary among health care providers and hospitals. What happens after the procedure?   Your blood pressure, heart rate, breathing rate, and blood  oxygen level will be monitored until the medicines you were given have worn off.  Do not drive for 24 hours if you were given a sedative. This information is not intended to replace advice given to you by your health care provider. Make sure you discuss any questions you have with your health care provider. Document Revised: 12/29/2017 Document Reviewed: 05/09/2016 Elsevier Patient Education  [place].

## 2020-07-09 NOTE — Telephone Encounter (Signed)
Patient is also scheduled for a left forearm loop graft poss brachial axillary graft with Dr. Delana Meyer on 07/15/20 at [facility]. Covid testing on 07/13/20 and pre-op was on 07/03/20. This information will be faxed to the patient's dialysis center at [facility].

## 2020-07-09 NOTE — H&P (View-Only) (Signed)
[facility] Admission History & Physical  MRN : [id number]  Joseph Gilmore is a 49 y.o. ([birth year]) male who presents with chief complaint of scheduled PermCath exchange.  History of Present Illness:  I am asked to evaluate the patient by the dialysis center. The patient was sent here because they were unable to achieve adequate dialysis yesterday. Furthermore the Center states they were unable to aspirate either lumen of the catheter yesterday.  This problem has been getting worse for about one week. The patient is unaware of any other change.   Patient denies pain or tenderness overlying the access.  There is no pain with dialysis.  Patient denies fevers or shaking chills while on dialysis.    Current Facility-Administered Medications  Medication Dose Route Frequency Provider Last Rate Last Admin  . 0.9 %  sodium chloride infusion   Intravenous Continuous Kris Hartmann, NP      . ceFAZolin (ANCEF) IVPB 1 g/50 mL premix  1 g Intravenous Once Kris Hartmann, NP      . diphenhydrAMINE (BENADRYL) injection 50 mg  50 mg Intravenous Once PRN Kris Hartmann, NP      . famotidine (PEPCID) tablet 40 mg  40 mg Oral Once PRN Kris Hartmann, NP      . HYDROmorphone (DILAUDID) injection 1 mg  1 mg Intravenous Once PRN Eulogio Ditch E, NP      . methylPREDNISolone sodium succinate (SOLU-MEDROL) 125 mg/2 mL injection 125 mg  125 mg Intravenous Once PRN Eulogio Ditch E, NP      . midazolam (VERSED) 2 MG/ML syrup 8 mg  8 mg Oral Once PRN Kris Hartmann, NP      . ondansetron (ZOFRAN) injection 4 mg  4 mg Intravenous Q6H PRN Kris Hartmann, NP       Past Surgical History:  Procedure Laterality Date  . DIALYSIS/PERMA CATHETER INSERTION N/A 04/06/2020   Procedure: DIALYSIS/PERMA CATHETER INSERTION;  Surgeon: Algernon Huxley, MD;  Location: [facility];  Service: Cardiovascular;  Laterality: N/A;  . DIALYSIS/PERMA CATHETER INSERTION N/A 05/26/2020   Procedure:  DIALYSIS/PERMA CATHETER INSERTION;  Surgeon: Katha Cabal, MD;  Location: [facility];  Service: Cardiovascular;  Laterality: N/A;   Social History Social History   Tobacco Use  . Smoking status: Never Smoker  . Smokeless tobacco: Never Used  Vaping Use  . Vaping Use: Never used  Substance Use Topics  . Alcohol use: Never  . Drug use: Never   Family History No family history on file.   No family history of bleeding or clotting disorders, autoimmune disease or porphyria.  Allergies  Allergen Reactions  . Chlorhexidine     Blisters    REVIEW OF SYSTEMS (Negative unless checked)  Constitutional: [] Weight loss  [] Fever  [] Chills Cardiac: [] Chest pain   [] Chest pressure   [] Palpitations   [] Shortness of breath when laying flat   [] Shortness of breath at rest   [x] Shortness of breath with exertion. Vascular:  [] Pain in legs with walking   [] Pain in legs at rest   [] Pain in legs when laying flat   [] Claudication   [] Pain in feet when walking  [] Pain in feet at rest  [] Pain in feet when laying flat   [] History of DVT   [] Phlebitis   [] Swelling in legs   [] Varicose veins   [] Non-healing ulcers Pulmonary:   [] Uses home oxygen   [] Productive cough   [] Hemoptysis   [] Wheeze  []   COPD   [] Asthma Neurologic:  [] Dizziness  [] Blackouts   [] Seizures   [] History of stroke   [] History of TIA  [] Aphasia   [] Temporary blindness   [] Dysphagia   [] Weakness or numbness in arms   [] Weakness or numbness in legs Musculoskeletal:  [] Arthritis   [] Joint swelling   [] Joint pain   [] Low back pain Hematologic:  [] Easy bruising  [] Easy bleeding   [] Hypercoagulable state   [] Anemic  [] Hepatitis Gastrointestinal:  [] Blood in stool   [] Vomiting blood  [] Gastroesophageal reflux/heartburn   [] Difficulty swallowing. Genitourinary:  [x] Chronic kidney disease   [] Difficult urination  [] Frequent urination  [] Burning with urination   [] Blood in urine Skin:  [] Rashes   [] Ulcers   [] Wounds Psychological:   [] History of anxiety   []  History of major depression.  Physical Examination  Vitals:   07/09/20 1102  BP: 137/79  Pulse: (!) 56  Resp: 20  Temp: 98 F (36.7 C)  TempSrc: Oral  Weight: 94.3 kg  Height: 5\' 8"  (1.727 m)   Body mass index is 31.63 kg/m. Gen: WD/WN, NAD Head: [place]/AT, No temporalis wasting. Prominent temp pulse not noted. Ear/Nose/Throat: Hearing grossly intact, nares w/o erythema or drainage, oropharynx w/o Erythema/Exudate,  Eyes: Conjunctiva clear, sclera non-icteric Neck: Trachea midline.  No JVD.  Pulmonary:  Good air movement, respirations not labored, no use of accessory muscles.  Cardiac: RRR, normal S1, S2.  Vascular: right tunneled catheter without tenderness or drainage Vessel Right Left  Radial Palpable Palpable   Gastrointestinal: soft, non-tender/non-distended. No guarding/reflex.  Musculoskeletal: M/S 5/5 throughout.  Extremities without ischemic changes.  No deformity or atrophy.  Neurologic: Sensation grossly intact in extremities.  Symmetrical.  Speech is fluent. Motor exam as listed above. Psychiatric: Judgment intact, Mood & affect appropriate for pt's clinical situation. Dermatologic: No rashes or ulcers noted.  No cellulitis or open wounds. Lymph : No Cervical, Axillary, or Inguinal lymphadenopathy.  CBC Lab Results  Component Value Date   WBC 9.3 06/20/2020   HGB 12.5 (L) 06/20/2020   HCT 39.1 06/20/2020   MCV 91.1 06/20/2020   PLT 125 (L) 06/20/2020   BMET    Component Value Date/Time   NA 138 06/20/2020 1618   K 3.6 06/20/2020 1618   CL 100 06/20/2020 1618   CO2 25 06/20/2020 1618   GLUCOSE 107 (H) 06/20/2020 1618   BUN 23 (H) 06/20/2020 1618   CREATININE 9.09 (H) 06/20/2020 1618   CALCIUM 7.2 (L) 06/20/2020 1618   GFRNONAA 7 (L) 06/20/2020 1618   GFRAA 8 (L) 04/24/2020 1219   Estimated Creatinine Clearance: 11.1 mL/min (A) (by C-G formula based on SCr of 9.09 mg/dL (H)).  COAG Lab Results  Component Value Date   INR  1.2 04/06/2020   Radiology No results found.  Assessment/Plan 1.  Complication dialysis device with thrombosis AV access:  Patient's tunneled catheter is thrombosed. The patient will undergo exchange of the catheter same venous access using interventional techniques. The risks and benefits were described to the patient.  All questions were answered.  The patient agrees to proceed with intervention.   2.  End-stage renal disease requiring hemodialysis:  Patient will continue dialysis therapy without further interruption if a successful exchange is not achieved then new site will be found for tunneled catheter placement. Dialysis has already been arranged since the patient missed their previous session  3.  Hypertension:  Patient will continue medical management; nephrology is following no changes in oral medications.  Discussed Dr. Mayme Genta,  PA-C  07/09/2020 11:56 AM

## 2020-07-13 ENCOUNTER — Other Ambulatory Visit
Admission: RE | Admit: 2020-07-13 | Discharge: 2020-07-13 | Disposition: A | Discharge status: Home / Self Care | Payer: Medicare Other | Source: Ambulatory Visit | Attending: Vascular Surgery | Admitting: Vascular Surgery

## 2020-07-13 ENCOUNTER — Other Ambulatory Visit: Payer: Self-pay

## 2020-07-13 DIAGNOSIS — Z20822 Contact with and (suspected) exposure to covid-19: Secondary | ICD-10-CM | POA: Insufficient documentation

## 2020-07-13 DIAGNOSIS — Z01812 Encounter for preprocedural laboratory examination: Secondary | ICD-10-CM | POA: Insufficient documentation

## 2020-07-13 LAB — TYPE AND SCREEN
ABO/RH(D): O POS
Antibody Screen: NEGATIVE

## 2020-07-13 LAB — CBC
HCT: 36.7 % — ABNORMAL LOW (ref 39.0–52.0)
Hemoglobin: 11.9 g/dL — ABNORMAL LOW (ref 13.0–17.0)
MCH: 29.5 pg (ref 26.0–34.0)
MCHC: 32.4 g/dL (ref 30.0–36.0)
MCV: 90.8 fL (ref 80.0–100.0)
Platelets: 170 10*3/uL (ref 150–400)
RBC: 4.04 MIL/uL — ABNORMAL LOW (ref 4.22–5.81)
RDW: 18.8 % — ABNORMAL HIGH (ref 11.5–15.5)
WBC: 7.2 10*3/uL (ref 4.0–10.5)
nRBC: 0 % (ref 0.0–0.2)

## 2020-07-13 LAB — BASIC METABOLIC PANEL
Anion gap: 17 — ABNORMAL HIGH (ref 5–15)
BUN: 47 mg/dL — ABNORMAL HIGH (ref 6–20)
CO2: 24 mmol/L (ref 22–32)
Calcium: 6.8 mg/dL — ABNORMAL LOW (ref 8.9–10.3)
Chloride: 101 mmol/L (ref 98–111)
Creatinine, Ser: 16.05 mg/dL — ABNORMAL HIGH (ref 0.61–1.24)
GFR, Estimated: 3 mL/min — ABNORMAL LOW (ref >60)
Glucose, Bld: 109 mg/dL — ABNORMAL HIGH (ref 70–99)
Potassium: 3.6 mmol/L (ref 3.5–5.1)
Sodium: 142 mmol/L (ref 135–145)

## 2020-07-13 LAB — APTT: aPTT: 36 seconds (ref 24–36)

## 2020-07-13 LAB — PROTIME-INR
INR: 1.1 (ref 0.8–1.2)
Prothrombin Time: 13.9 seconds (ref 11.4–15.2)

## 2020-07-13 NOTE — Progress Notes (Signed)
  [facility] Perioperative Services: Pre-Admission/Anesthesia Testing  Abnormal Lab Notification   Date: 07/13/20  Name: Joseph Gilmore MRN:   [id number]  Re: Abnormal labs noted during PAT appointment   Provider(s) Notified: Schnier, Dolores Lory, MD and Eulogio Ditch, NP-C Notification mode: Routed and/or faxed via [facility] LAB VALUE(S): Lab Results  Component Value Date   BUN 47 (H) 07/13/2020   CREATININE 16.05 (H) 07/13/2020   GFRNONAA 3 (L) 07/13/2020   CALCIUM 6.8 (L) 07/13/2020    Notes:  Patient is scheduled for an INSERTION OF ARTERIOVENOUS (AV) GORE-TEX GRAFT ARM ( FOREARM LOOP POSS BRACHIAL AXILLARY) on 07/15/2020. This is a Community education officer; no formal response is required.  Honor Loh, MSN, APRN, FNP-C, CEN [facility]  Peri-operative Services Nurse Practitioner Phone: [phone] 07/13/20 1:26 PM

## 2020-07-14 LAB — SARS CORONAVIRUS 2 (TAT 6-24 HRS): SARS Coronavirus 2: NEGATIVE

## 2020-07-14 MED ORDER — SODIUM CHLORIDE 0.9 % IV SOLN: INTRAVENOUS | Status: DC

## 2020-07-14 MED ORDER — ORAL CARE MOUTH RINSE: 15.0000 mL | Freq: Once | OROMUCOSAL | Status: DC

## 2020-07-14 MED ORDER — FAMOTIDINE 20 MG PO TABS: 20.0000 mg | ORAL_TABLET | Freq: Once | ORAL | Status: AC

## 2020-07-15 ENCOUNTER — Other Ambulatory Visit: Payer: Self-pay

## 2020-07-15 ENCOUNTER — Ambulatory Visit: Payer: Medicare Other | Admitting: Urgent Care

## 2020-07-15 ENCOUNTER — Encounter: Payer: Self-pay | Admitting: Vascular Surgery

## 2020-07-15 ENCOUNTER — Encounter
Admission: RE | Disposition: A | Discharge status: Home / Self Care | Payer: Self-pay | Source: Home / Self Care | Attending: Vascular Surgery

## 2020-07-15 ENCOUNTER — Ambulatory Visit
Admission: RE | Admit: 2020-07-15 | Discharge: 2020-07-15 | Disposition: A | Discharge status: Home / Self Care | Payer: Medicare Other | Attending: Vascular Surgery | Admitting: Vascular Surgery

## 2020-07-15 DIAGNOSIS — Y9389 Activity, other specified: Secondary | ICD-10-CM | POA: Diagnosis not present

## 2020-07-15 DIAGNOSIS — N185 Chronic kidney disease, stage 5: Secondary | ICD-10-CM

## 2020-07-15 DIAGNOSIS — X58XXXA Exposure to other specified factors, initial encounter: Secondary | ICD-10-CM | POA: Diagnosis not present

## 2020-07-15 DIAGNOSIS — Z992 Dependence on renal dialysis: Secondary | ICD-10-CM | POA: Insufficient documentation

## 2020-07-15 DIAGNOSIS — N186 End stage renal disease: Secondary | ICD-10-CM | POA: Diagnosis present

## 2020-07-15 DIAGNOSIS — Z888 Allergy status to other drugs, medicaments and biological substances status: Secondary | ICD-10-CM | POA: Diagnosis not present

## 2020-07-15 DIAGNOSIS — I120 Hypertensive chronic kidney disease with stage 5 chronic kidney disease or end stage renal disease: Secondary | ICD-10-CM | POA: Diagnosis not present

## 2020-07-15 DIAGNOSIS — T82868A Thrombosis of vascular prosthetic devices, implants and grafts, initial encounter: Secondary | ICD-10-CM | POA: Insufficient documentation

## 2020-07-15 HISTORY — PX: AV FISTULA PLACEMENT: SHX1204

## 2020-07-15 HISTORY — DX: Chronic kidney disease, unspecified: N18.9

## 2020-07-15 LAB — ABO/RH: ABO/RH(D): O POS

## 2020-07-15 LAB — GLUCOSE, CAPILLARY
Glucose-Capillary: 57 mg/dL — ABNORMAL LOW (ref 70–99)
Glucose-Capillary: 64 mg/dL — ABNORMAL LOW (ref 70–99)
Glucose-Capillary: 92 mg/dL (ref 70–99)

## 2020-07-15 LAB — BASIC METABOLIC PANEL
Anion gap: 17 — ABNORMAL HIGH (ref 5–15)
BUN: 47 mg/dL — ABNORMAL HIGH (ref 6–20)
CO2: 24 mmol/L (ref 22–32)
Calcium: 6.8 mg/dL — ABNORMAL LOW (ref 8.9–10.3)
Chloride: 99 mmol/L (ref 98–111)
Creatinine, Ser: 13.83 mg/dL — ABNORMAL HIGH (ref 0.61–1.24)
GFR, Estimated: 4 mL/min — ABNORMAL LOW (ref >60)
Glucose, Bld: 92 mg/dL (ref 70–99)
Potassium: 3.7 mmol/L (ref 3.5–5.1)
Sodium: 140 mmol/L (ref 135–145)

## 2020-07-15 SURGERY — ARTERIOVENOUS (AV) FISTULA CREATION
Anesthesia: General | Laterality: Left

## 2020-07-15 MED ORDER — SODIUM CHLORIDE 0.9 % IV SOLN
INTRAVENOUS | Status: DC | PRN
  Administered 2020-07-15: 75 ug/min via INTRAVENOUS

## 2020-07-15 MED ORDER — PHENYLEPHRINE HCL (PRESSORS) 10 MG/ML IV SOLN
INTRAVENOUS | Status: DC | PRN
  Administered 2020-07-15 (×2): 100 ug via INTRAVENOUS

## 2020-07-15 MED ORDER — LIDOCAINE HCL (PF) 2 % IJ SOLN
INTRAMUSCULAR | Status: AC
  Filled 2020-07-15: qty 5

## 2020-07-15 MED ORDER — SUCCINYLCHOLINE CHLORIDE 200 MG/10ML IV SOSY
PREFILLED_SYRINGE | INTRAVENOUS | Status: AC
  Filled 2020-07-15: qty 10

## 2020-07-15 MED ORDER — FENTANYL CITRATE (PF) 100 MCG/2ML IJ SOLN
INTRAMUSCULAR | Status: AC
  Filled 2020-07-15: qty 2

## 2020-07-15 MED ORDER — BUPIVACAINE HCL (PF) 0.25 % IJ SOLN
INTRAMUSCULAR | Status: DC | PRN
  Administered 2020-07-15: 10 mL

## 2020-07-15 MED ORDER — DEXMEDETOMIDINE (PRECEDEX) IN NS 20 MCG/5ML (4 MCG/ML) IV SYRINGE
PREFILLED_SYRINGE | INTRAVENOUS | Status: AC
  Filled 2020-07-15: qty 5

## 2020-07-15 MED ORDER — ROCURONIUM BROMIDE 10 MG/ML (PF) SYRINGE
PREFILLED_SYRINGE | INTRAVENOUS | Status: AC
  Filled 2020-07-15: qty 10

## 2020-07-15 MED ORDER — PHENYLEPHRINE HCL (PRESSORS) 10 MG/ML IV SOLN
INTRAVENOUS | Status: AC
  Filled 2020-07-15: qty 1

## 2020-07-15 MED ORDER — LIDOCAINE HCL (CARDIAC) PF 100 MG/5ML IV SOSY
PREFILLED_SYRINGE | INTRAVENOUS | Status: DC | PRN
  Administered 2020-07-15: 80 mg via INTRAVENOUS

## 2020-07-15 MED ORDER — OXYCODONE HCL 5 MG PO TABS: 5.0000 mg | ORAL_TABLET | Freq: Once | ORAL | Status: DC | PRN

## 2020-07-15 MED ORDER — GLYCOPYRROLATE 0.2 MG/ML IJ SOLN
INTRAMUSCULAR | Status: AC
  Filled 2020-07-15: qty 1

## 2020-07-15 MED ORDER — FAMOTIDINE 20 MG PO TABS
ORAL_TABLET | ORAL | Status: AC
  Administered 2020-07-15: 20 mg via ORAL
  Filled 2020-07-15: qty 1

## 2020-07-15 MED ORDER — HYDROCODONE-ACETAMINOPHEN 5-325 MG PO TABS
1.0000 | ORAL_TABLET | Freq: Four times a day (QID) | ORAL | 0 refills | Status: DC | PRN
Start: 2020-07-15 — End: 2020-11-20

## 2020-07-15 MED ORDER — PROPOFOL 10 MG/ML IV BOLUS
INTRAVENOUS | Status: AC
  Filled 2020-07-15: qty 60

## 2020-07-15 MED ORDER — CEFAZOLIN SODIUM-DEXTROSE 2-3 GM-%(50ML) IV SOLR: INTRAVENOUS | Status: DC | PRN

## 2020-07-15 MED ORDER — FENTANYL CITRATE (PF) 100 MCG/2ML IJ SOLN
INTRAMUSCULAR | Status: DC | PRN
  Administered 2020-07-15 (×2): 50 ug via INTRAVENOUS

## 2020-07-15 MED ORDER — MIDAZOLAM HCL 2 MG/2ML IJ SOLN
INTRAMUSCULAR | Status: DC | PRN
  Administered 2020-07-15: 1 mg via INTRAVENOUS

## 2020-07-15 MED ORDER — PROPOFOL 10 MG/ML IV BOLUS
INTRAVENOUS | Status: DC | PRN
  Administered 2020-07-15: 50 mg via INTRAVENOUS
  Administered 2020-07-15: 150 mg via INTRAVENOUS

## 2020-07-15 MED ORDER — FENTANYL CITRATE (PF) 100 MCG/2ML IJ SOLN: 25.0000 ug | INTRAMUSCULAR | Status: DC | PRN

## 2020-07-15 MED ORDER — EPHEDRINE SULFATE 50 MG/ML IJ SOLN
INTRAMUSCULAR | Status: DC | PRN
  Administered 2020-07-15 (×5): 10 mg via INTRAVENOUS

## 2020-07-15 MED ORDER — SODIUM CHLORIDE 0.9 % IV SOLN
INTRAVENOUS | Status: DC | PRN
  Administered 2020-07-15: 500 mL via INTRAMUSCULAR

## 2020-07-15 MED ORDER — EPINEPHRINE 1 MG/10ML IJ SOSY
PREFILLED_SYRINGE | INTRAMUSCULAR | Status: AC
  Filled 2020-07-15: qty 10

## 2020-07-15 MED ORDER — BUPIVACAINE LIPOSOME 1.3 % IJ SUSP
INTRAMUSCULAR | Status: DC | PRN
  Administered 2020-07-15: 5 mL

## 2020-07-15 MED ORDER — HEPARIN SODIUM (PORCINE) 1000 UNIT/ML IJ SOLN
INTRAMUSCULAR | Status: AC
  Filled 2020-07-15: qty 1

## 2020-07-15 MED ORDER — ONDANSETRON HCL 4 MG/2ML IJ SOLN
INTRAMUSCULAR | Status: DC | PRN
  Administered 2020-07-15: 4 mg via INTRAVENOUS

## 2020-07-15 MED ORDER — CEFAZOLIN SODIUM-DEXTROSE 2-3 GM-%(50ML) IV SOLR
INTRAVENOUS | Status: DC | PRN
  Administered 2020-07-15: 2 g via INTRAVENOUS

## 2020-07-15 MED ORDER — EPHEDRINE 5 MG/ML INJ
INTRAVENOUS | Status: AC
  Filled 2020-07-15: qty 10

## 2020-07-15 MED ORDER — DEXAMETHASONE SODIUM PHOSPHATE 10 MG/ML IJ SOLN
INTRAMUSCULAR | Status: DC | PRN
  Administered 2020-07-15: 10 mg via INTRAVENOUS

## 2020-07-15 MED ORDER — HEPARIN SODIUM (PORCINE) 5000 UNIT/ML IJ SOLN
INTRAMUSCULAR | Status: AC
  Filled 2020-07-15: qty 1

## 2020-07-15 MED ORDER — OXYCODONE HCL 5 MG/5ML PO SOLN: 5.0000 mg | Freq: Once | ORAL | Status: DC | PRN

## 2020-07-15 MED ORDER — BUPIVACAINE LIPOSOME 1.3 % IJ SUSP
INTRAMUSCULAR | Status: AC
  Filled 2020-07-15: qty 20

## 2020-07-15 MED ORDER — ONDANSETRON HCL 4 MG/2ML IJ SOLN
INTRAMUSCULAR | Status: AC
  Filled 2020-07-15: qty 2

## 2020-07-15 MED ORDER — MIDAZOLAM HCL 2 MG/2ML IJ SOLN
INTRAMUSCULAR | Status: AC
  Filled 2020-07-15: qty 2

## 2020-07-15 MED ORDER — DEXAMETHASONE SODIUM PHOSPHATE 10 MG/ML IJ SOLN
INTRAMUSCULAR | Status: AC
  Filled 2020-07-15: qty 1

## 2020-07-15 MED ORDER — GLYCOPYRROLATE 0.2 MG/ML IJ SOLN
INTRAMUSCULAR | Status: DC | PRN
  Administered 2020-07-15: .2 mg via INTRAVENOUS

## 2020-07-15 MED ORDER — BUPIVACAINE HCL (PF) 0.5 % IJ SOLN
INTRAMUSCULAR | Status: AC
  Filled 2020-07-15: qty 30

## 2020-07-15 MED ORDER — CEFAZOLIN SODIUM-DEXTROSE 2-4 GM/100ML-% IV SOLN
INTRAVENOUS | Status: AC
  Filled 2020-07-15: qty 100

## 2020-07-15 SURGICAL SUPPLY — 67 items
ADH SKN CLS APL DERMABOND .7 (GAUZE/BANDAGES/DRESSINGS) ×1
APL PRP STRL LF DISP 70% ISPRP (MISCELLANEOUS) ×1
APPLIER CLIP 11 MED OPEN (CLIP)
APPLIER CLIP 9.375 SM OPEN (CLIP)
APR CLP MED 11 20 MLT OPN (CLIP)
APR CLP SM 9.3 20 MLT OPN (CLIP)
BAG COUNTER SPONGE EZ (MISCELLANEOUS) ×2 IMPLANT
BAG DECANTER FOR FLEXI CONT (MISCELLANEOUS) ×3 IMPLANT
BAG SPNG 4X4 CLR HAZ (MISCELLANEOUS) ×1
BLADE SURG SZ11 CARB STEEL (BLADE) ×3 IMPLANT
BOOT SUTURE AID YELLOW STND (SUTURE) ×3 IMPLANT
BRUSH SCRUB EZ  4% CHG (MISCELLANEOUS) ×3
BRUSH SCRUB EZ 4% CHG (MISCELLANEOUS) ×1 IMPLANT
CANISTER SUCT 1200ML W/VALVE (MISCELLANEOUS) ×3 IMPLANT
CHLORAPREP W/TINT 26 (MISCELLANEOUS) ×3 IMPLANT
CLIP APPLIE 11 MED OPEN (CLIP) IMPLANT
CLIP APPLIE 9.375 SM OPEN (CLIP) IMPLANT
COUNTER SPONGE BAG EZ (MISCELLANEOUS) ×1
COVER WAND RF STERILE (DRAPES) ×3 IMPLANT
DERMABOND ADVANCED (GAUZE/BANDAGES/DRESSINGS) ×2
DERMABOND ADVANCED .7 DNX12 (GAUZE/BANDAGES/DRESSINGS) ×1 IMPLANT
DRESSING SURGICEL FIBRLLR 1X2 (HEMOSTASIS) ×1 IMPLANT
DRSG SURGICEL FIBRILLAR 1X2 (HEMOSTASIS)
ELECT CAUTERY BLADE 6.4 (BLADE) ×3 IMPLANT
ELECT REM PT RETURN 9FT ADLT (ELECTROSURGICAL) ×3
ELECTRODE REM PT RTRN 9FT ADLT (ELECTROSURGICAL) ×1 IMPLANT
GLOVE BIO SURGEON STRL SZ7 (GLOVE) ×3 IMPLANT
GLOVE INDICATOR 7.5 STRL GRN (GLOVE) ×3 IMPLANT
GLOVE SURG SYN 8.0 (GLOVE) ×3 IMPLANT
GLOVE SURG SYN 8.0 PF PI (GLOVE) ×1 IMPLANT
GOWN STRL REUS W/ TWL LRG LVL3 (GOWN DISPOSABLE) ×2 IMPLANT
GOWN STRL REUS W/ TWL XL LVL3 (GOWN DISPOSABLE) ×1 IMPLANT
GOWN STRL REUS W/TWL LRG LVL3 (GOWN DISPOSABLE) ×6
GOWN STRL REUS W/TWL XL LVL3 (GOWN DISPOSABLE) ×3
IV NS 500ML (IV SOLUTION) ×3
IV NS 500ML BAXH (IV SOLUTION) ×1 IMPLANT
KIT TURNOVER KIT A (KITS) ×3 IMPLANT
LABEL OR SOLS (LABEL) ×3 IMPLANT
LOOP RED MAXI  1X406MM (MISCELLANEOUS) ×2
LOOP VESSEL MAXI  1X406 RED (MISCELLANEOUS) ×1
LOOP VESSEL MAXI 1X406 RED (MISCELLANEOUS) ×1 IMPLANT
LOOP VESSEL MINI 0.8X406 BLUE (MISCELLANEOUS) ×2 IMPLANT
LOOPS BLUE MINI 0.8X406MM (MISCELLANEOUS) ×4
MANIFOLD NEPTUNE II (INSTRUMENTS) ×3 IMPLANT
NDL FILTER BLUNT 18X1 1/2 (NEEDLE) ×1 IMPLANT
NEEDLE FILTER BLUNT 18X 1/2SAF (NEEDLE) ×2
NEEDLE FILTER BLUNT 18X1 1/2 (NEEDLE) ×1 IMPLANT
NS IRRIG 500ML POUR BTL (IV SOLUTION) ×3 IMPLANT
PACK EXTREMITY (MISCELLANEOUS) ×3 IMPLANT
PAD PREP 24X41 OB/GYN DISP (PERSONAL CARE ITEMS) ×3 IMPLANT
STOCKINETTE 48X4 2 PLY STRL (GAUZE/BANDAGES/DRESSINGS) ×1 IMPLANT
STOCKINETTE STRL 4IN 9604848 (GAUZE/BANDAGES/DRESSINGS) ×3 IMPLANT
SUT GTX CV-6 30 (SUTURE) ×6 IMPLANT
SUT MNCRL+ 5-0 UNDYED PC-3 (SUTURE) ×1 IMPLANT
SUT MONOCRYL 5-0 (SUTURE) ×3
SUT PROLENE 6 0 BV (SUTURE) ×6 IMPLANT
SUT SILK 2 0 (SUTURE) ×3
SUT SILK 2 0 SH (SUTURE) ×1 IMPLANT
SUT SILK 2-0 18XBRD TIE 12 (SUTURE) ×1 IMPLANT
SUT SILK 3 0 (SUTURE) ×3
SUT SILK 3-0 18XBRD TIE 12 (SUTURE) ×1 IMPLANT
SUT SILK 4 0 (SUTURE) ×3
SUT SILK 4-0 18XBRD TIE 12 (SUTURE) ×1 IMPLANT
SUT VIC AB 3-0 SH 27 (SUTURE) ×6
SUT VIC AB 3-0 SH 27X BRD (SUTURE) ×2 IMPLANT
SYR 20ML LL LF (SYRINGE) ×3 IMPLANT
SYR 3ML LL SCALE MARK (SYRINGE) ×3 IMPLANT

## 2020-07-15 NOTE — Discharge Instructions (Signed)

## 2020-07-15 NOTE — Transfer of Care (Signed)
Immediate Anesthesia Transfer of Care Note  Patient: Joseph Gilmore  Procedure(s) Performed: ARTERIOVENOUS FISTULA CREATION (Left )  Patient Location: PACU  Anesthesia Type:General  Level of Consciousness: sedated  Airway & Oxygen Therapy: Patient Spontanous Breathing and Patient connected to face mask oxygen  Post-op Assessment: Report given to RN and Post -op Vital signs reviewed and stable  Post vital signs: Reviewed  Last Vitals:  Vitals Value Taken Time  BP    Temp    Pulse 69 07/15/20 1014  Resp 12 07/15/20 1014  SpO2 100 % 07/15/20 1014  Vitals shown include unvalidated device data.  Last Pain:  Vitals:   07/15/20 0703  TempSrc: Oral  PainSc: 0-No pain         Complications: No complications documented.

## 2020-07-15 NOTE — Anesthesia Preprocedure Evaluation (Signed)
Anesthesia Evaluation  Patient identified by MRN, date of birth, ID band Patient awake    Reviewed: Allergy & Precautions, H&P , NPO status , Patient's Chart, lab work & pertinent test results, reviewed documented beta blocker date and time   Airway Mallampati: II  TM Distance: >3 FB Neck ROM: full    Dental  (+) Teeth Intact   Pulmonary neg pulmonary ROS,    Pulmonary exam normal        Cardiovascular Exercise Tolerance: Poor hypertension, On Medications Normal cardiovascular exam+ Valvular Problems/Murmurs  Rate:Normal     Neuro/Psych PSYCHIATRIC DISORDERS Anxiety  Neuromuscular disease    GI/Hepatic negative GI ROS, Neg liver ROS,   Endo/Other  negative endocrine ROS  Renal/GU ESRF and DialysisRenal disease  negative genitourinary   Musculoskeletal   Abdominal   Peds  Hematology  (+) Blood dyscrasia, anemia ,   Anesthesia Other Findings   Reproductive/Obstetrics negative OB ROS                             Anesthesia Physical Anesthesia Plan  ASA: III  Anesthesia Plan: General LMA   Post-op Pain Management:    Induction:   PONV Risk Score and Plan: 3  Airway Management Planned:   Additional Equipment:   Intra-op Plan:   Post-operative Plan:   Informed Consent: I have reviewed the patients History and Physical, chart, labs and discussed the procedure including the risks, benefits and alternatives for the proposed anesthesia with the patient or authorized representative who has indicated his/her understanding and acceptance.       Plan Discussed with: CRNA  Anesthesia Plan Comments:         Anesthesia Quick Evaluation

## 2020-07-15 NOTE — Op Note (Signed)
OPERATIVE NOTE   PROCEDURE: left brachial cephalic arteriovenous fistula placement  PRE-OPERATIVE DIAGNOSIS: End Stage Renal Disease  POST-OPERATIVE DIAGNOSIS: End Stage Renal Disease  SURGEON: [name]  ASSISTANT(S): None  ANESTHESIA: general  ESTIMATED BLOOD LOSS: <50 cc  FINDING(S): Both the artery as well as the deep and superficial veins were quite small just below the antecubital fossa.  Once they were exposed did not feel that his venous outflow would support an AV graft.  The cephalic vein at this level did dilate to 3 mm with Joseph Gilmore coronary dilators and therefore I created a more distal brachiocephalic fistula and did not place a prosthetic.  SPECIMEN(S):  none  INDICATIONS:   Joseph Gilmore is a 49 y.o. male who presents with end stage renal disease.  The patient is scheduled for left brachiocephalic arteriovenous fistula placement.  The patient is aware the risks include but are not limited to: bleeding, infection, steal syndrome, nerve damage, ischemic monomelic neuropathy, failure to mature, and need for additional procedures.  The patient is aware of the risks of the procedure and elects to proceed forward.  DESCRIPTION: After full informed written consent was obtained from the patient, the patient was brought back to the operating room and placed supine upon the operating table.  Prior to induction, the patient received IV antibiotics.   After obtaining adequate anesthesia, the patient was then prepped and draped in the standard fashion for a left arm access procedure.   A first assistant was required to provide a safe and appropriate environment for executing the surgery.  The assistant was integral in providing retraction, exposure, running suture providing suction and in the closing process.   A transverse incision was then created approximately 1 fingerbreadth below the antecubital crease.. The cephalic vein was then identified and and subsequently  moving deeper the brachial artery as well as the deep veins were identified.  The brachial artery was dissected circumferentially.   At this point it became quite apparent that the size of the venous outflow would not support a prosthetic graft.  However the cephalic vein that I did uncover at this level appeared to be approximately 3 mm in diameter.  Attention was then turned to the brachial artery which was exposed through the same incision and looped proximally and distally. Side branches were controlled with 4-0 silk ties.  The distal segment of the cephalic vein was ligated with a  2-0 silk, and the vein was transected.  The proximal segment was interrogated with serial dilators.  The vein accepted up to a 3 mm dilator with some difficulty. Heparinized saline was infused into the vein and clamped it with a small bulldog.  At this point, I reset my exposure of the brachial artery and controlled the artery with vessel loops proximally and distally.  An arteriotomy was then made with a #11 blade, and extended with a Potts scissor.  Heparinized saline was injected proximal and distal into the radial artery.  The vein was then approximated to the artery while the artery was in its native bed and subsequently the vein was beveled using Potts scissors. The vein was then sewn to the artery in an end-to-side configuration with a running stitch of 6-0 Prolene.  Prior to completing this anastomosis Flushing maneuvers were performed and the artery was allowed to forward and back bleed.  There was no evidence of clot from any vessels.  I completed the anastomosis in the usual fashion and then released all  vessel loops and clamps.    There was good  thrill in the venous outflow, and there was 1+ palpable radial pulse.  At this point, I irrigated out the surgical wound.  There was no further active bleeding.  The subcutaneous tissue was reapproximated with a running stitch of 3-0 Vicryl.  The skin was then  reapproximated with a running subcuticular stitch of 4-0 Vicryl.  The skin was then cleaned, dried, and reinforced with Dermabond.    The patient tolerated this procedure well.   COMPLICATIONS: None  CONDITION: Joseph Gilmore [facility]  Office: [phone]   07/15/2020, 10:48 AM

## 2020-07-15 NOTE — Interval H&P Note (Signed)
History and Physical Interval Note:  07/15/2020 8:03 AM  Joseph Gilmore  has presented today for surgery, with the diagnosis of ESRD.  The various methods of treatment have been discussed with the patient and family. After consideration of risks, benefits and other options for treatment, the patient has consented to  Procedure(s): INSERTION OF ARTERIOVENOUS (AV) GORE-TEX GRAFT ARM ( FOREARM LOOP POSS BRACHIAL AXILLARY) (Left) as a surgical intervention.  The patient's history has been reviewed, patient examined, no change in status, stable for surgery.  I have reviewed the patient's chart and labs.  Questions were answered to the patient's satisfaction.     [name]

## 2020-07-15 NOTE — Anesthesia Procedure Notes (Signed)
Procedure Name: LMA Insertion Date/Time: 07/15/2020 8:30 AM Performed by: Rolla Plate, CRNA Pre-anesthesia Checklist: Patient identified, Patient being monitored, Timeout performed, Emergency Drugs available and Suction available Patient Re-evaluated:Patient Re-evaluated prior to induction Oxygen Delivery Method: Circle system utilized Preoxygenation: Pre-oxygenation with 100% oxygen Induction Type: IV induction Ventilation: Mask ventilation without difficulty LMA: LMA inserted LMA Size: 4.0 Tube type: Oral Number of attempts: 1 Placement Confirmation: positive ETCO2 and breath sounds checked- equal and bilateral Tube secured with: Tape Dental Injury: Teeth and Oropharynx as per pre-operative assessment

## 2020-07-16 NOTE — Anesthesia Postprocedure Evaluation (Signed)
Anesthesia Post Note  Patient: Joseph Gilmore  Procedure(s) Performed: ARTERIOVENOUS FISTULA CREATION (Left )  Patient location during evaluation: PACU Anesthesia Type: General Level of consciousness: awake and alert Pain management: pain level controlled Vital Signs Assessment: post-procedure vital signs reviewed and stable Respiratory status: spontaneous breathing, nonlabored ventilation, respiratory function stable and patient connected to nasal cannula oxygen Cardiovascular status: blood pressure returned to baseline and stable Postop Assessment: no apparent nausea or vomiting Anesthetic complications: no   No complications documented.   Last Vitals:  Vitals:   07/15/20 1044 07/15/20 1056  BP: 129/84 120/78  Pulse: 86 82  Resp: 15 18  Temp: (!) 36.3 C (!) 36 C  SpO2: 99% 100%    Last Pain:  Vitals:   07/16/20 0826  TempSrc:   PainSc: 0-No pain                 Molli Barrows

## 2020-07-21 ENCOUNTER — Other Ambulatory Visit: Payer: Self-pay

## 2020-07-21 ENCOUNTER — Other Ambulatory Visit: Payer: Medicare Other

## 2020-07-21 DIAGNOSIS — R972 Elevated prostate specific antigen [PSA]: Secondary | ICD-10-CM

## 2020-07-22 ENCOUNTER — Other Ambulatory Visit: Payer: Self-pay

## 2020-07-22 LAB — PSA: Prostate Specific Ag, Serum: 1.4 ng/mL (ref 0.0–4.0)

## 2020-07-27 ENCOUNTER — Ambulatory Visit (INDEPENDENT_AMBULATORY_CARE_PROVIDER_SITE_OTHER): Payer: Medicare Other | Admitting: Urology

## 2020-07-27 ENCOUNTER — Other Ambulatory Visit: Payer: Self-pay

## 2020-07-27 ENCOUNTER — Encounter: Payer: Self-pay | Admitting: Urology

## 2020-07-27 VITALS — BP 150/80 | HR 74 | Ht 68.0 in | Wt 216.0 lb

## 2020-07-27 DIAGNOSIS — Z87898 Personal history of other specified conditions: Secondary | ICD-10-CM | POA: Insufficient documentation

## 2020-07-27 DIAGNOSIS — N401 Enlarged prostate with lower urinary tract symptoms: Secondary | ICD-10-CM | POA: Diagnosis not present

## 2020-07-27 NOTE — Progress Notes (Signed)
07/27/2020 9:11 AM   Joseph Gilmore 15-Oct-1970 [id number]  Referring provider: Lorelee [address], [place],  [place] [address]  Chief Complaint  Patient presents with  . Follow-up    HPI: 49 y.o. male with autism presents for follow-up.  He presents today with his father   Hospitalized 03/2020 with acute on chronic renal failure and urinary retention  Started on dutasteride and tamsulosin  PSA was drawn in the setting of an elevated 26.65  Successful voiding trial late August 2021 with PVR 31 mL  Father states he is doing well; remains on tamsulosin/dutasteride  Repeat PSA 07/21/2020 1.4   PMH: Past Medical History:  Diagnosis Date  . Autism   . Chronic kidney disease   . Murmur   . OCD (obsessive compulsive disorder)     Surgical History: Past Surgical History:  Procedure Laterality Date  . AV FISTULA PLACEMENT Left 07/15/2020   Procedure: ARTERIOVENOUS FISTULA CREATION;  Surgeon: Katha Cabal, MD;  Location: [facility] ORS;  Service: Vascular;  Laterality: Left;  . DIALYSIS/PERMA CATHETER INSERTION N/A 04/06/2020   Procedure: DIALYSIS/PERMA CATHETER INSERTION;  Surgeon: Algernon Huxley, MD;  Location: [facility];  Service: Cardiovascular;  Laterality: N/A;  . DIALYSIS/PERMA CATHETER INSERTION N/A 05/26/2020   Procedure: DIALYSIS/PERMA CATHETER INSERTION;  Surgeon: Katha Cabal, MD;  Location: [facility];  Service: Cardiovascular;  Laterality: N/A;  . DIALYSIS/PERMA CATHETER INSERTION N/A 07/09/2020   Procedure: DIALYSIS/PERMA CATHETER INSERTION;  Surgeon: Algernon Huxley, MD;  Location: [facility];  Service: Cardiovascular;  Laterality: N/A;    Home Medications:  Allergies as of 07/27/2020      Reactions   Chlorhexidine    Blisters       Medication List       Accurate as of July 27, 2020  9:11 AM. If you have any questions, ask your nurse or doctor.        amLODipine 10 MG tablet Commonly known as:  NORVASC Take 1 tablet (10 mg total) by mouth daily. What changed: when to take this   bisacodyl 5 MG EC tablet Commonly known as: DULCOLAX Take 5 mg by mouth daily as needed for moderate constipation.   dutasteride 0.5 MG capsule Commonly known as: AVODART Take 1 capsule (0.5 mg total) by mouth daily. What changed: when to take this   HYDROcodone-acetaminophen 5-325 MG tablet Commonly known as: Norco Take 1-2 tablets by mouth every 6 (six) hours as needed for moderate pain or severe pain.   losartan 100 MG tablet Commonly known as: COZAAR Take 1 tablet (100 mg total) by mouth daily. What changed: when to take this   metoprolol tartrate 100 MG tablet Commonly known as: LOPRESSOR Take 1 tablet (100 mg total) by mouth 2 (two) times daily.   RENA-VITE PO Take 1 tablet by mouth Every Tuesday,Thursday,and Saturday with dialysis.   tamsulosin 0.4 MG Caps capsule Commonly known as: FLOMAX Take 1 capsule (0.4 mg total) by mouth daily. What changed: when to take this   Vitamin D-3 125 MCG (5000 UT) Tabs Take 5,000 Units by mouth daily.       Allergies:  Allergies  Allergen Reactions  . Chlorhexidine     Blisters     Family History: No family history on file.  Social History:  reports that he has never smoked. He has never used smokeless tobacco. He reports that he does not drink alcohol and does not use drugs.  Physical Exam: BP (!) 150/80   Pulse 74   Ht 5\' 8"  (1.727 m)   Wt 216 lb (98 kg)   BMI 32.84 kg/m   Constitutional:  Alert, No acute distress. HEENT: [place] AT, moist mucus membranes.  Trachea midline, no masses. Cardiovascular: No clubbing, cyanosis, or edema. Respiratory: Normal respiratory effort, no increased work of breathing.   Assessment & Plan:    1.  BPH with LUTS  Stable on tamsulosin/finasteride  Continue present meds  Annual follow-up  2.  Elevated PSA  Repeat PSA normal  Recheck 1 year  3.  Urinary  retention  Resolved   Abbie Sons, MD  [facility] [address] [place], [place] [address] [phone]

## 2020-07-31 ENCOUNTER — Other Ambulatory Visit (INDEPENDENT_AMBULATORY_CARE_PROVIDER_SITE_OTHER): Payer: Self-pay | Admitting: Vascular Surgery

## 2020-07-31 DIAGNOSIS — Z9889 Other specified postprocedural states: Secondary | ICD-10-CM

## 2020-07-31 DIAGNOSIS — N186 End stage renal disease: Secondary | ICD-10-CM

## 2020-08-05 ENCOUNTER — Ambulatory Visit (INDEPENDENT_AMBULATORY_CARE_PROVIDER_SITE_OTHER): Payer: Medicare Other | Admitting: Nurse Practitioner

## 2020-08-05 ENCOUNTER — Encounter (INDEPENDENT_AMBULATORY_CARE_PROVIDER_SITE_OTHER): Payer: Self-pay | Admitting: Nurse Practitioner

## 2020-08-05 ENCOUNTER — Ambulatory Visit (INDEPENDENT_AMBULATORY_CARE_PROVIDER_SITE_OTHER): Payer: Medicare Other

## 2020-08-05 ENCOUNTER — Other Ambulatory Visit: Payer: Self-pay

## 2020-08-05 VITALS — BP 98/67 | HR 71 | Ht 68.0 in | Wt 229.0 lb

## 2020-08-05 DIAGNOSIS — I10 Essential (primary) hypertension: Secondary | ICD-10-CM

## 2020-08-05 DIAGNOSIS — Z9889 Other specified postprocedural states: Secondary | ICD-10-CM | POA: Diagnosis not present

## 2020-08-05 DIAGNOSIS — N186 End stage renal disease: Secondary | ICD-10-CM

## 2020-08-05 DIAGNOSIS — F840 Autistic disorder: Secondary | ICD-10-CM

## 2020-08-05 NOTE — Progress Notes (Signed)
Subjective:    Patient ID: Joseph Gilmore, male    DOB: 1971-07-16, 48 y.o.   MRN: [id number] Chief Complaint  Patient presents with  . Follow-up    3 wk [facility] post Arteriovenous fistula creation Lt ARM HDA    The patient presents today for a first look at his left brachiocephalic AV fistula.  This was created on 07/15/2020.  Initially it was thought that the patient needed a graft however upon visualization, it was felt that her brachiocephalic was possible.  The incision has healed well.  The patient is autistic and is a somewhat poor historian.  His father is here however to provide much of the history.  The patient denies any pain in the father notes that he does not seem to be in pain at this time.  The patient has been on dialysis for the last 4 months or so.  Denies any fever, chills, nausea, vomiting or diarrhea.  Today noninvasive studies show flow volume of 1832.  There is concern for retained valve in the mid upper arm with some slight narrowing.  Is noted that the left radial artery is retrograde.   Review of Systems  Neurological: Negative for numbness.  All other systems reviewed and are negative.      Objective:   Physical Exam Vitals reviewed.  Cardiovascular:     Rate and Rhythm: Normal rate and regular rhythm.     Pulses:          Radial pulses are 1+ on the left side.     Arteriovenous access: left arteriovenous access is present.    Comments: Good thrill and bruit of left brachiocephalic AV fistula Pulmonary:     Effort: Pulmonary effort is normal.  Skin:    General: Skin is warm and dry.  Neurological:     Mental Status: He is alert and oriented to person, place, and time.  Psychiatric:        Attention and Perception: Attention normal.        Mood and Affect: Mood normal.        Speech: Speech is delayed.        Behavior: Behavior normal. Behavior is cooperative.        Thought Content: Thought content normal.        Cognition and Memory: Cognition is  impaired.        Judgment: Judgment normal.     BP 98/67   Pulse 71   Ht 5\' 8"  (1.727 m)   Wt 229 lb (103.9 kg)   BMI 34.82 kg/m   Past Medical History:  Diagnosis Date  . Autism   . Chronic kidney disease   . Murmur   . OCD (obsessive compulsive disorder)     Social History   Socioeconomic History  . Marital status: Single    Spouse name: Not on file  . Number of children: Not on file  . Years of education: Not on file  . Highest education level: Not on file  Occupational History  . Not on file  Tobacco Use  . Smoking status: Never Smoker  . Smokeless tobacco: Never Used  Vaping Use  . Vaping Use: Never used  Substance and Sexual Activity  . Alcohol use: Never  . Drug use: Never  . Sexual activity: Not Currently  Other Topics Concern  . Not on file  Social History Narrative   Lives with father .   Social Determinants of Radio broadcast assistant  Strain:   . Difficulty of Paying Living Expenses: Not on file  Food Insecurity:   . Worried About Charity fundraiser in the Last Year: Not on file  . Ran Out of Food in the Last Year: Not on file  Transportation Needs:   . Lack of Transportation (Medical): Not on file  . Lack of Transportation (Non-Medical): Not on file  Physical Activity:   . Days of Exercise per Week: Not on file  . Minutes of Exercise per Session: Not on file  Stress:   . Feeling of Stress : Not on file  Social Connections:   . Frequency of Communication with Friends and Family: Not on file  . Frequency of Social Gatherings with Friends and Family: Not on file  . Attends Religious Services: Not on file  . Active Member of Clubs or Organizations: Not on file  . Attends Archivist Meetings: Not on file  . Marital Status: Not on file  Intimate Partner Violence:   . Fear of Current or Ex-Partner: Not on file  . Emotionally Abused: Not on file  . Physically Abused: Not on file  . Sexually Abused: Not on file    Past Surgical  History:  Procedure Laterality Date  . AV FISTULA PLACEMENT Left 07/15/2020   Procedure: ARTERIOVENOUS FISTULA CREATION;  Surgeon: Katha Cabal, MD;  Location: [facility];  Service: Vascular;  Laterality: Left;  . DIALYSIS/PERMA CATHETER INSERTION N/A 04/06/2020   Procedure: DIALYSIS/PERMA CATHETER INSERTION;  Surgeon: Algernon Huxley, MD;  Location: [facility];  Service: Cardiovascular;  Laterality: N/A;  . DIALYSIS/PERMA CATHETER INSERTION N/A 05/26/2020   Procedure: DIALYSIS/PERMA CATHETER INSERTION;  Surgeon: Katha Cabal, MD;  Location: [facility];  Service: Cardiovascular;  Laterality: N/A;  . DIALYSIS/PERMA CATHETER INSERTION N/A 07/09/2020   Procedure: DIALYSIS/PERMA CATHETER INSERTION;  Surgeon: Algernon Huxley, MD;  Location: [facility];  Service: Cardiovascular;  Laterality: N/A;    History reviewed. No pertinent family history.  Allergies  Allergen Reactions  . Chlorhexidine     Blisters     CBC Latest Ref Rng & Units 07/13/2020 06/20/2020 04/24/2020  WBC 4.0 - 10.5 K/uL 7.2 9.3 8.0  Hemoglobin 13.0 - 17.0 g/dL 11.9(L) 12.5(L) 8.6(L)  Hematocrit 39 - 52 % 36.7(L) 39.1 27.6(L)  Platelets 150 - 400 K/uL 170 125(L) 130(L)      CMP     Component Value Date/Time   NA 140 07/15/2020 0738   K 3.7 07/15/2020 0738   CL 99 07/15/2020 0738   CO2 24 07/15/2020 0738   GLUCOSE 92 07/15/2020 0738   BUN 47 (H) 07/15/2020 0738   CREATININE 13.83 (H) 07/15/2020 0738   CALCIUM 6.8 (L) 07/15/2020 0738   PROT 6.8 04/24/2020 1219   ALBUMIN 3.5 04/24/2020 1219   AST 25 04/24/2020 1219   ALT 14 04/24/2020 1219   ALKPHOS 47 04/24/2020 1219   BILITOT 0.6 04/24/2020 1219   GFRNONAA 4 (L) 07/15/2020 0738   GFRAA 8 (L) 04/24/2020 1219     No results found.     Assessment & Plan:   1. ESRD (end stage renal disease) ([place]) Based on the patient's noninvasive studies today the flow is adequate however the fistula is nearly nonpalpable except for the  thrill.  This may be a result of the fistula not having had time to fully mature as its only been 3 weeks since his access was placed.  There is also the concern for  possible presence of a retained valve which may be delaying maturation.  Lastly the patient's arm is larger and there is concerned that this may be deep and he may need to have a superficialized.   Also concerning is the presence of retrograde flow in the radial artery which is concerning for possible steal syndrome.  The patient is nonverbal so it is hard to tell if he is in pain currently but perfusion seems to be adequate at this time.  We will have the patient return in 5 to 6 weeks for repeat HDA to also evaluate for steal syndrome.  We will also evaluate if the fistula has had time to mature. - VAS US DUPLEX DIALYSIS ACCESS (AVF, AVG); Future  2. Essential hypertension Continue antihypertensive medications as already ordered, these medications have been reviewed and there are no changes at this time.   3. Autism The patient is not exactly nonverbal, he does have difficulty expressing whether he is truly in pain.  Discussed with the patient's father the signs symptoms that may indicate that his head is in pain as well as to feel for temperature and ways to assess for possible numbness or decreased function of his hand.  Previous exertion happens he should contact her office and we will try to evaluate him sooner.   Current Outpatient Medications on File Prior to Visit  Medication Sig Dispense Refill  . B Complex-C-Folic Acid (RENA-VITE PO) Take 1 tablet by mouth Every Tuesday,Thursday,and Saturday with dialysis.     Marland Kitchen HYDROcodone-acetaminophen (NORCO) 5-325 MG tablet Take 1-2 tablets by mouth every 6 (six) hours as needed for moderate pain or severe pain. 40 tablet 0  . tamsulosin (FLOMAX) 0.4 MG CAPS capsule Take 1 capsule (0.4 mg total) by mouth daily. (Patient taking differently: Take 0.4 mg by mouth daily after breakfast. ) 30  capsule 11  . amLODipine (NORVASC) 10 MG tablet Take 1 tablet (10 mg total) by mouth daily. (Patient taking differently: Take 10 mg by mouth in the morning and at bedtime. ) 30 tablet 0  . bisacodyl (DULCOLAX) 5 MG EC tablet Take 5 mg by mouth daily as needed for moderate constipation.     . Cholecalciferol (VITAMIN D-3) 125 MCG (5000 UT) TABS Take 5,000 Units by mouth daily.    Marland Kitchen dutasteride (AVODART) 0.5 MG capsule Take 1 capsule (0.5 mg total) by mouth daily. (Patient not taking: Reported on 08/05/2020) 30 capsule 11  . losartan (COZAAR) 100 MG tablet Take 1 tablet (100 mg total) by mouth daily. (Patient taking differently: Take 100 mg by mouth every morning. ) 30 tablet 0  . metoprolol tartrate (LOPRESSOR) 100 MG tablet Take 1 tablet (100 mg total) by mouth 2 (two) times daily. 60 tablet 0   No current facility-administered medications on file prior to visit.    There are no Patient Instructions on file for this visit. No follow-ups on file.   Kris Hartmann, NP

## 2020-09-16 ENCOUNTER — Ambulatory Visit (INDEPENDENT_AMBULATORY_CARE_PROVIDER_SITE_OTHER): Payer: Medicare (Managed Care) | Admitting: Nurse Practitioner

## 2020-09-16 ENCOUNTER — Encounter (INDEPENDENT_AMBULATORY_CARE_PROVIDER_SITE_OTHER): Payer: Self-pay | Admitting: Nurse Practitioner

## 2020-09-16 ENCOUNTER — Other Ambulatory Visit: Payer: Self-pay

## 2020-09-16 ENCOUNTER — Ambulatory Visit (INDEPENDENT_AMBULATORY_CARE_PROVIDER_SITE_OTHER): Payer: Medicare (Managed Care)

## 2020-09-16 VITALS — BP 109/62 | HR 64 | Ht 68.0 in | Wt 234.0 lb

## 2020-09-16 DIAGNOSIS — N186 End stage renal disease: Secondary | ICD-10-CM

## 2020-09-16 DIAGNOSIS — I10 Essential (primary) hypertension: Secondary | ICD-10-CM

## 2020-09-16 DIAGNOSIS — F840 Autistic disorder: Secondary | ICD-10-CM

## 2020-09-16 NOTE — Progress Notes (Unsigned)
[id number] 3 0. -[id number]

## 2020-09-17 ENCOUNTER — Telehealth (INDEPENDENT_AMBULATORY_CARE_PROVIDER_SITE_OTHER): Payer: Self-pay

## 2020-09-17 ENCOUNTER — Encounter (INDEPENDENT_AMBULATORY_CARE_PROVIDER_SITE_OTHER): Payer: Self-pay

## 2020-09-17 NOTE — Telephone Encounter (Signed)
Patient has been schedule for left fistulagram on 09/30/20 with arrival time 6:30 am and is schedule to have Covid testing on 09/28/20 8-1 pm. Pre procedure instructions has been instructed and will be mailed out.

## 2020-09-27 ENCOUNTER — Encounter (INDEPENDENT_AMBULATORY_CARE_PROVIDER_SITE_OTHER): Payer: Self-pay | Admitting: Nurse Practitioner

## 2020-09-27 NOTE — H&P (View-Only) (Signed)
Subjective:    Patient ID: Joseph Gilmore, male    DOB: June 16, 1971, 50 y.o.   MRN: [id number] Chief Complaint  Patient presents with  . Follow-up    U/S    The patient presents today for follow-up evaluation of the left brachiocephalic AV fistula.  The fistula was created on 07/15/2020.  At the initial visit the patient still did not have a matured fistula.  There is concern for possible valve retention.  At the time the flow volume was 1837.  The patient is autistic and not able to provide whether he is having pain or discomfort in his arm.  His father provides much of the history.  The patient's father notes that sometimes it seems as if his son is holding his hand or as if he is in pain.  However he has not endorsed any pain.  He does not note any cold distinction of his fingertips.  The patient has not had any fever, chills, nausea, vomiting or diarrhea.  He is still currently maintained via PermCath.  Today the patient has a flow volume of 597.  Patient appears to have elevated velocities near the distal upper arm as well as near the anastomosis.  Still studies have been negative.  The patient does have biphasic waveforms within the left radial artery.     Review of Systems  Hematological: Bruises/bleeds easily.  All other systems reviewed and are negative.      Objective:   Physical Exam Vitals reviewed.  HENT:     Head: Normocephalic.  Cardiovascular:     Rate and Rhythm: Normal rate.     Pulses:          Carotid pulses are 1+ on the left side.    Arteriovenous access: left arteriovenous access is present.    Comments: Faint thrill and bruit in left brachiocephalic AV fistula Pulmonary:     Effort: Pulmonary effort is normal.  Skin:    General: Skin is warm and dry.  Neurological:     Mental Status: He is alert and oriented to person, place, and time.  Psychiatric:        Mood and Affect: Mood normal.        Behavior: Behavior normal.        Thought Content:  Thought content normal.        Judgment: Judgment normal.     BP 109/62   Pulse 64   Ht '5\' 8"'$  (1.727 m)   Wt 234 lb (106.1 kg)   BMI 35.58 kg/m   Past Medical History:  Diagnosis Date  . Autism   . Chronic kidney disease   . Murmur   . OCD (obsessive compulsive disorder)     Social History   Socioeconomic History  . Marital status: Single    Spouse name: Not on file  . Number of children: Not on file  . Years of education: Not on file  . Highest education level: Not on file  Occupational History  . Not on file  Tobacco Use  . Smoking status: Never Smoker  . Smokeless tobacco: Never Used  Vaping Use  . Vaping Use: Never used  Substance and Sexual Activity  . Alcohol use: Never  . Drug use: Never  . Sexual activity: Not Currently  Other Topics Concern  . Not on file  Social History Narrative   Lives with father .   Social Determinants of Health   Financial Resource Strain: Not on file  Food Insecurity: Not on file  Transportation Needs: Not on file  Physical Activity: Not on file  Stress: Not on file  Social Connections: Not on file  Intimate Partner Violence: Not on file    Past Surgical History:  Procedure Laterality Date  . AV FISTULA PLACEMENT Left 07/15/2020   Procedure: ARTERIOVENOUS FISTULA CREATION;  Surgeon: Katha Cabal, MD;  Location: [facility];  Service: Vascular;  Laterality: Left;  . DIALYSIS/PERMA CATHETER INSERTION N/A 04/06/2020   Procedure: DIALYSIS/PERMA CATHETER INSERTION;  Surgeon: Algernon Huxley, MD;  Location: [place] [facility];  Service: Cardiovascular;  Laterality: N/A;  . DIALYSIS/PERMA CATHETER INSERTION N/A 05/26/2020   Procedure: DIALYSIS/PERMA CATHETER INSERTION;  Surgeon: Katha Cabal, MD;  Location: [place] [facility];  Service: Cardiovascular;  Laterality: N/A;  . DIALYSIS/PERMA CATHETER INSERTION N/A 07/09/2020   Procedure: DIALYSIS/PERMA CATHETER INSERTION;  Surgeon: Algernon Huxley, MD;  Location: [place]  [facility];  Service: Cardiovascular;  Laterality: N/A;    History reviewed. No pertinent family history.  Allergies  Allergen Reactions  . Chlorhexidine Other (See Comments)    Blisters     CBC Latest Ref Rng & Units 07/13/2020 06/20/2020 04/24/2020  WBC 4.0 - 10.5 K/uL 7.2 9.3 8.0  Hemoglobin 13.0 - 17.0 g/dL 11.9(L) 12.5(L) 8.6(L)  Hematocrit 39.0 - 52.0 % 36.7(L) 39.1 27.6(L)  Platelets 150 - 400 K/uL 170 125(L) 130(L)      CMP     Component Value Date/Time   NA 140 07/15/2020 0738   K 3.7 07/15/2020 0738   CL 99 07/15/2020 0738   CO2 24 07/15/2020 0738   GLUCOSE 92 07/15/2020 0738   BUN 47 (H) 07/15/2020 0738   CREATININE 13.83 (H) 07/15/2020 0738   CALCIUM 6.8 (L) 07/15/2020 0738   PROT 6.8 04/24/2020 1219   ALBUMIN 3.5 04/24/2020 1219   AST 25 04/24/2020 1219   ALT 14 04/24/2020 1219   ALKPHOS 47 04/24/2020 1219   BILITOT 0.6 04/24/2020 1219   GFRNONAA 4 (L) 07/15/2020 0738   GFRAA 8 (L) 04/24/2020 1219     No results found.     Assessment & Plan:   1. ESRD (end stage renal disease) ([place]) Recommend:  The patient is experiencing increasing problems with their dialysis access.  Patient should have a fistulagram with the intention for intervention.  The intention for intervention is to restore appropriate flow and prevent thrombosis and possible loss of the access.  As well as improve the quality of dialysis therapy.  The risks, benefits and alternative therapies were reviewed in detail with the patient.  All questions were answered.  The patient agrees to proceed with angio/intervention.      2. Essential hypertension Continue antihypertensive medications as already ordered, these medications have been reviewed and there are no changes at this time.   3. Autism The patient is a poor historian due to his autism.  His father provides most of the history however certain symptoms will be difficult to assess.   Current Outpatient Medications on File Prior  to Visit  Medication Sig Dispense Refill  . B Complex-C-Folic Acid (RENA-VITE PO) Take 1 tablet by mouth daily.    . Cholecalciferol (VITAMIN D-3) 125 MCG (5000 UT) TABS Take 5,000 Units by mouth daily.    Marland Kitchen dutasteride (AVODART) 0.5 MG capsule Take 1 capsule (0.5 mg total) by mouth daily. 30 capsule 11  . HYDROcodone-acetaminophen (NORCO) 5-325 MG tablet Take 1-2 tablets by mouth every 6 (six) hours as needed  for moderate pain or severe pain. (Patient not taking: Reported on 09/22/2020) 40 tablet 0  . tamsulosin (FLOMAX) 0.4 MG CAPS capsule Take 1 capsule (0.4 mg total) by mouth daily. (Patient taking differently: Take 0.4 mg by mouth daily after breakfast.) 30 capsule 11  . amLODipine (NORVASC) 10 MG tablet Take 1 tablet (10 mg total) by mouth daily. 30 tablet 0  . losartan (COZAAR) 100 MG tablet Take 1 tablet (100 mg total) by mouth daily. 30 tablet 0   No current facility-administered medications on file prior to visit.    There are no Patient Instructions on file for this visit. No follow-ups on file.   Kris Hartmann, NP

## 2020-09-27 NOTE — Progress Notes (Signed)
Subjective:    Patient ID: Joseph Gilmore, male    DOB: 02-16-1971, 50 y.o.   MRN: [id number] Chief Complaint  Patient presents with  . Follow-up    U/S    The patient presents today for follow-up evaluation of the left brachiocephalic AV fistula.  The fistula was created on 07/15/2020.  At the initial visit the patient still did not have a matured fistula.  There is concern for possible valve retention.  At the time the flow volume was 1837.  The patient is autistic and not able to provide whether he is having pain or discomfort in his arm.  His father provides much of the history.  The patient's father notes that sometimes it seems as if his son is holding his hand or as if he is in pain.  However he has not endorsed any pain.  He does not note any cold distinction of his fingertips.  The patient has not had any fever, chills, nausea, vomiting or diarrhea.  He is still currently maintained via PermCath.  Today the patient has a flow volume of 597.  Patient appears to have elevated velocities near the distal upper arm as well as near the anastomosis.  Still studies have been negative.  The patient does have biphasic waveforms within the left radial artery.     Review of Systems  Hematological: Bruises/bleeds easily.  All other systems reviewed and are negative.      Objective:   Physical Exam Vitals reviewed.  HENT:     Head: Normocephalic.  Cardiovascular:     Rate and Rhythm: Normal rate.     Pulses:          Carotid pulses are 1+ on the left side.    Arteriovenous access: left arteriovenous access is present.    Comments: Faint thrill and bruit in left brachiocephalic AV fistula Pulmonary:     Effort: Pulmonary effort is normal.  Skin:    General: Skin is warm and dry.  Neurological:     Mental Status: He is alert and oriented to person, place, and time.  Psychiatric:        Mood and Affect: Mood normal.        Behavior: Behavior normal.        Thought Content:  Thought content normal.        Judgment: Judgment normal.     BP 109/62   Pulse 64   Ht '5\' 8"'$  (1.727 m)   Wt 234 lb (106.1 kg)   BMI 35.58 kg/m   Past Medical History:  Diagnosis Date  . Autism   . Chronic kidney disease   . Murmur   . OCD (obsessive compulsive disorder)     Social History   Socioeconomic History  . Marital status: Single    Spouse name: Not on file  . Number of children: Not on file  . Years of education: Not on file  . Highest education level: Not on file  Occupational History  . Not on file  Tobacco Use  . Smoking status: Never Smoker  . Smokeless tobacco: Never Used  Vaping Use  . Vaping Use: Never used  Substance and Sexual Activity  . Alcohol use: Never  . Drug use: Never  . Sexual activity: Not Currently  Other Topics Concern  . Not on file  Social History Narrative   Lives with father .   Social Determinants of Health   Financial Resource Strain: Not on file  Food Insecurity: Not on file  Transportation Needs: Not on file  Physical Activity: Not on file  Stress: Not on file  Social Connections: Not on file  Intimate Partner Violence: Not on file    Past Surgical History:  Procedure Laterality Date  . AV FISTULA PLACEMENT Left 07/15/2020   Procedure: ARTERIOVENOUS FISTULA CREATION;  Surgeon: Katha Cabal, MD;  Location: [facility];  Service: Vascular;  Laterality: Left;  . DIALYSIS/PERMA CATHETER INSERTION N/A 04/06/2020   Procedure: DIALYSIS/PERMA CATHETER INSERTION;  Surgeon: Algernon Huxley, MD;  Location: [place] [facility];  Service: Cardiovascular;  Laterality: N/A;  . DIALYSIS/PERMA CATHETER INSERTION N/A 05/26/2020   Procedure: DIALYSIS/PERMA CATHETER INSERTION;  Surgeon: Katha Cabal, MD;  Location: [place] [facility];  Service: Cardiovascular;  Laterality: N/A;  . DIALYSIS/PERMA CATHETER INSERTION N/A 07/09/2020   Procedure: DIALYSIS/PERMA CATHETER INSERTION;  Surgeon: Algernon Huxley, MD;  Location: [place]  [facility];  Service: Cardiovascular;  Laterality: N/A;    History reviewed. No pertinent family history.  Allergies  Allergen Reactions  . Chlorhexidine Other (See Comments)    Blisters     CBC Latest Ref Rng & Units 07/13/2020 06/20/2020 04/24/2020  WBC 4.0 - 10.5 K/uL 7.2 9.3 8.0  Hemoglobin 13.0 - 17.0 g/dL 11.9(L) 12.5(L) 8.6(L)  Hematocrit 39.0 - 52.0 % 36.7(L) 39.1 27.6(L)  Platelets 150 - 400 K/uL 170 125(L) 130(L)      CMP     Component Value Date/Time   NA 140 07/15/2020 0738   K 3.7 07/15/2020 0738   CL 99 07/15/2020 0738   CO2 24 07/15/2020 0738   GLUCOSE 92 07/15/2020 0738   BUN 47 (H) 07/15/2020 0738   CREATININE 13.83 (H) 07/15/2020 0738   CALCIUM 6.8 (L) 07/15/2020 0738   PROT 6.8 04/24/2020 1219   ALBUMIN 3.5 04/24/2020 1219   AST 25 04/24/2020 1219   ALT 14 04/24/2020 1219   ALKPHOS 47 04/24/2020 1219   BILITOT 0.6 04/24/2020 1219   GFRNONAA 4 (L) 07/15/2020 0738   GFRAA 8 (L) 04/24/2020 1219     No results found.     Assessment & Plan:   1. ESRD (end stage renal disease) ([place]) Recommend:  The patient is experiencing increasing problems with their dialysis access.  Patient should have a fistulagram with the intention for intervention.  The intention for intervention is to restore appropriate flow and prevent thrombosis and possible loss of the access.  As well as improve the quality of dialysis therapy.  The risks, benefits and alternative therapies were reviewed in detail with the patient.  All questions were answered.  The patient agrees to proceed with angio/intervention.      2. Essential hypertension Continue antihypertensive medications as already ordered, these medications have been reviewed and there are no changes at this time.   3. Autism The patient is a poor historian due to his autism.  His father provides most of the history however certain symptoms will be difficult to assess.   Current Outpatient Medications on File Prior  to Visit  Medication Sig Dispense Refill  . B Complex-C-Folic Acid (RENA-VITE PO) Take 1 tablet by mouth daily.    . Cholecalciferol (VITAMIN D-3) 125 MCG (5000 UT) TABS Take 5,000 Units by mouth daily.    Marland Kitchen dutasteride (AVODART) 0.5 MG capsule Take 1 capsule (0.5 mg total) by mouth daily. 30 capsule 11  . HYDROcodone-acetaminophen (NORCO) 5-325 MG tablet Take 1-2 tablets by mouth every 6 (six) hours as needed  for moderate pain or severe pain. (Patient not taking: Reported on 09/22/2020) 40 tablet 0  . tamsulosin (FLOMAX) 0.4 MG CAPS capsule Take 1 capsule (0.4 mg total) by mouth daily. (Patient taking differently: Take 0.4 mg by mouth daily after breakfast.) 30 capsule 11  . amLODipine (NORVASC) 10 MG tablet Take 1 tablet (10 mg total) by mouth daily. 30 tablet 0  . losartan (COZAAR) 100 MG tablet Take 1 tablet (100 mg total) by mouth daily. 30 tablet 0   No current facility-administered medications on file prior to visit.    There are no Patient Instructions on file for this visit. No follow-ups on file.   Kris Hartmann, NP

## 2020-09-28 ENCOUNTER — Other Ambulatory Visit
Admission: RE | Admit: 2020-09-28 | Discharge: 2020-09-28 | Disposition: A | Discharge status: Home / Self Care | Payer: Medicare (Managed Care) | Source: Ambulatory Visit | Attending: Vascular Surgery | Admitting: Vascular Surgery

## 2020-09-28 ENCOUNTER — Encounter (INDEPENDENT_AMBULATORY_CARE_PROVIDER_SITE_OTHER): Payer: Self-pay

## 2020-09-28 ENCOUNTER — Other Ambulatory Visit: Payer: Self-pay

## 2020-09-28 DIAGNOSIS — Z20822 Contact with and (suspected) exposure to covid-19: Secondary | ICD-10-CM | POA: Insufficient documentation

## 2020-09-28 DIAGNOSIS — Z01812 Encounter for preprocedural laboratory examination: Secondary | ICD-10-CM | POA: Diagnosis present

## 2020-09-28 LAB — SARS CORONAVIRUS 2 (TAT 6-24 HRS): SARS Coronavirus 2: NEGATIVE

## 2020-09-29 ENCOUNTER — Telehealth (INDEPENDENT_AMBULATORY_CARE_PROVIDER_SITE_OTHER): Payer: Self-pay

## 2020-09-29 NOTE — Telephone Encounter (Signed)
Due unforeseen circumstances patient procedure has been reschedule to 10/07/20 and Covid-19 testing on 10/05/20. Te patient has been made aware and pre-instructions will be mailed out to the patient

## 2020-09-30 DIAGNOSIS — N186 End stage renal disease: Secondary | ICD-10-CM

## 2020-10-01 ENCOUNTER — Other Ambulatory Visit: Admission: RE | Admit: 2020-10-01 | Payer: Medicare (Managed Care) | Source: Ambulatory Visit

## 2020-10-05 ENCOUNTER — Other Ambulatory Visit: Payer: Self-pay

## 2020-10-05 ENCOUNTER — Other Ambulatory Visit
Admission: RE | Admit: 2020-10-05 | Discharge: 2020-10-05 | Disposition: A | Discharge status: Home / Self Care | Payer: Medicare (Managed Care) | Source: Ambulatory Visit | Attending: Vascular Surgery | Admitting: Vascular Surgery

## 2020-10-05 DIAGNOSIS — Z20822 Contact with and (suspected) exposure to covid-19: Secondary | ICD-10-CM | POA: Diagnosis not present

## 2020-10-05 DIAGNOSIS — Z01812 Encounter for preprocedural laboratory examination: Secondary | ICD-10-CM | POA: Diagnosis present

## 2020-10-06 LAB — SARS CORONAVIRUS 2 (TAT 6-24 HRS): SARS Coronavirus 2: NEGATIVE

## 2020-10-07 ENCOUNTER — Encounter: Payer: Self-pay | Admitting: Vascular Surgery

## 2020-10-07 ENCOUNTER — Other Ambulatory Visit: Payer: Self-pay

## 2020-10-07 ENCOUNTER — Ambulatory Visit
Admission: RE | Admit: 2020-10-07 | Discharge: 2020-10-07 | Disposition: A | Discharge status: Home / Self Care | Payer: Medicare (Managed Care) | Attending: Vascular Surgery | Admitting: Vascular Surgery

## 2020-10-07 ENCOUNTER — Encounter
Admission: RE | Disposition: A | Discharge status: Home / Self Care | Payer: Self-pay | Source: Home / Self Care | Attending: Vascular Surgery

## 2020-10-07 ENCOUNTER — Other Ambulatory Visit (INDEPENDENT_AMBULATORY_CARE_PROVIDER_SITE_OTHER): Payer: Self-pay | Admitting: Nurse Practitioner

## 2020-10-07 DIAGNOSIS — F840 Autistic disorder: Secondary | ICD-10-CM | POA: Insufficient documentation

## 2020-10-07 DIAGNOSIS — I120 Hypertensive chronic kidney disease with stage 5 chronic kidney disease or end stage renal disease: Secondary | ICD-10-CM | POA: Insufficient documentation

## 2020-10-07 DIAGNOSIS — T82898A Other specified complication of vascular prosthetic devices, implants and grafts, initial encounter: Secondary | ICD-10-CM | POA: Diagnosis not present

## 2020-10-07 DIAGNOSIS — T82510A Breakdown (mechanical) of surgically created arteriovenous fistula, initial encounter: Secondary | ICD-10-CM | POA: Diagnosis present

## 2020-10-07 DIAGNOSIS — Z79899 Other long term (current) drug therapy: Secondary | ICD-10-CM | POA: Diagnosis not present

## 2020-10-07 DIAGNOSIS — N186 End stage renal disease: Secondary | ICD-10-CM | POA: Insufficient documentation

## 2020-10-07 DIAGNOSIS — N185 Chronic kidney disease, stage 5: Secondary | ICD-10-CM | POA: Diagnosis not present

## 2020-10-07 DIAGNOSIS — Y841 Kidney dialysis as the cause of abnormal reaction of the patient, or of later complication, without mention of misadventure at the time of the procedure: Secondary | ICD-10-CM | POA: Insufficient documentation

## 2020-10-07 DIAGNOSIS — T82858A Stenosis of vascular prosthetic devices, implants and grafts, initial encounter: Secondary | ICD-10-CM | POA: Diagnosis not present

## 2020-10-07 DIAGNOSIS — Z992 Dependence on renal dialysis: Secondary | ICD-10-CM | POA: Insufficient documentation

## 2020-10-07 HISTORY — PX: A/V FISTULAGRAM: CATH118298

## 2020-10-07 LAB — POTASSIUM (ARMC VASCULAR LAB ONLY): Potassium (ARMC vascular lab): 3.9 (ref 3.5–5.1)

## 2020-10-07 SURGERY — A/V FISTULAGRAM
Anesthesia: Moderate Sedation | Laterality: Left

## 2020-10-07 MED ORDER — DIPHENHYDRAMINE HCL 50 MG/ML IJ SOLN: 50.0000 mg | Freq: Once | INTRAMUSCULAR | Status: DC | PRN

## 2020-10-07 MED ORDER — IODIXANOL 320 MG/ML IV SOLN
INTRAVENOUS | Status: DC | PRN
  Administered 2020-10-07: 20 mL

## 2020-10-07 MED ORDER — HEPARIN SODIUM (PORCINE) 5000 UNIT/ML IJ SOLN
INTRAMUSCULAR | Status: AC
  Filled 2020-10-07: qty 1

## 2020-10-07 MED ORDER — CEFAZOLIN SODIUM-DEXTROSE 1-4 GM/50ML-% IV SOLN
INTRAVENOUS | Status: AC
  Administered 2020-10-07: 1 g
  Filled 2020-10-07: qty 50

## 2020-10-07 MED ORDER — METHYLPREDNISOLONE SODIUM SUCC 125 MG IJ SOLR: 125.0000 mg | Freq: Once | INTRAMUSCULAR | Status: DC | PRN

## 2020-10-07 MED ORDER — HYDROMORPHONE HCL 1 MG/ML IJ SOLN: 1.0000 mg | Freq: Once | INTRAMUSCULAR | Status: DC | PRN

## 2020-10-07 MED ORDER — HEPARIN SODIUM (PORCINE) 1000 UNIT/ML IJ SOLN: 1000.0000 [IU] | Freq: Once | INTRAMUSCULAR | Status: AC

## 2020-10-07 MED ORDER — SODIUM CHLORIDE 0.9 % IV SOLN: INTRAVENOUS | Status: DC

## 2020-10-07 MED ORDER — MIDAZOLAM HCL 2 MG/ML PO SYRP: 8.0000 mg | ORAL_SOLUTION | Freq: Once | ORAL | Status: DC | PRN

## 2020-10-07 MED ORDER — HEPARIN SODIUM (PORCINE) 1000 UNIT/ML IJ SOLN
INTRAMUSCULAR | Status: AC
  Administered 2020-10-07: 1700 [IU] via INTRAVENOUS
  Filled 2020-10-07: qty 1

## 2020-10-07 MED ORDER — FENTANYL CITRATE (PF) 100 MCG/2ML IJ SOLN
INTRAMUSCULAR | Status: AC
  Administered 2020-10-07: 50 ug
  Filled 2020-10-07: qty 2

## 2020-10-07 MED ORDER — MIDAZOLAM HCL 5 MG/5ML IJ SOLN
INTRAMUSCULAR | Status: AC
  Administered 2020-10-07: 2 mg
  Filled 2020-10-07: qty 5

## 2020-10-07 MED ORDER — FAMOTIDINE 20 MG PO TABS: 40.0000 mg | ORAL_TABLET | Freq: Once | ORAL | Status: DC | PRN

## 2020-10-07 MED ORDER — CEFAZOLIN SODIUM-DEXTROSE 1-4 GM/50ML-% IV SOLN: 1.0000 g | Freq: Once | INTRAVENOUS | Status: DC

## 2020-10-07 MED ORDER — ONDANSETRON HCL 4 MG/2ML IJ SOLN: 4.0000 mg | Freq: Four times a day (QID) | INTRAMUSCULAR | Status: DC | PRN

## 2020-10-07 SURGICAL SUPPLY — 12 items
CATH ANGIO 5F PIGTAIL 65CM (CATHETERS) IMPLANT
DEVICE TORQUE (MISCELLANEOUS) ×1 IMPLANT
NDL ENTRY 21GA 7CM ECHOTIP (NEEDLE) IMPLANT
NEEDLE ENTRY 21GA 7CM ECHOTIP (NEEDLE) ×2 IMPLANT
PACK ANGIOGRAPHY (CUSTOM PROCEDURE TRAY) ×2 IMPLANT
SET INTRO CAPELLA COAXIAL (SET/KITS/TRAYS/PACK) ×1 IMPLANT
SHEATH BRITE TIP 5FRX11 (SHEATH) IMPLANT
SHEATH BRITE TIP 6FRX5.5 (SHEATH) ×2 IMPLANT
SUT MNCRL AB 4-0 PS2 18 (SUTURE) ×1 IMPLANT
SYR MEDRAD MARK 7 150ML (SYRINGE) IMPLANT
TUBING CONTRAST HIGH PRESS 72 (TUBING) IMPLANT
WIRE GUIDERIGHT .035X150 (WIRE) ×1 IMPLANT

## 2020-10-07 NOTE — Discharge Instructions (Signed)
Dialysis Fistulogram, Care After The following information offers guidance on how to care for yourself after your procedure. Your health care provider may also give you more specific instructions. If you have problems or questions, contact your health care provider. What can I expect after the procedure? After the procedure, it is common to have:  A small amount of discomfort in the area where the small tube (catheter) was placed for the procedure.  A small amount of bruising around the fistula.  Sleepiness and tiredness (fatigue). Follow these instructions at home: Puncture site care  Follow instructions from your health care provider about how to take care of the site where catheters were inserted. Make sure you: ? Wash your hands with soap and water for at least 20 seconds before and after you change your bandage (dressing). If soap and water are not available, use hand sanitizer. ? Change your dressing as told by your health care provider. ? Leave stitches (sutures), skin glue, or adhesive strips in place. These skin closures may need to stay in place for 2 weeks or longer. If adhesive strip edges start to loosen and curl up, you may trim the loose edges. Do not remove adhesive strips completely unless your health care provider tells you to do that.  Check your puncture area every day for signs of infection. Check for: ? More redness, swelling, or pain. ? Fluid or blood. ? Warmth. ? Pus or a bad smell.   Activity  Rest as much as you can.  If you were given a sedative during the procedure, it can affect you for several hours. Do not drive or operate machinery until your health care provider says that it is safe.  Do not lift anything that is heavier than 5 lb (2.3 kg), or the limit that you are told, on the day of your procedure.  Do not do anything strenuous with your arm for the rest of the day. Avoid household activities, such as vacuuming.  Return to your normal activities as  told by your health care provider. Ask your health care provider what activities are safe for you. Safety To prevent damage to your graft or fistula:  Do not wear tight-fitting clothing or jewelry on the arm or leg that has your graft or fistula.  Tell all your health care providers that you have a dialysis fistula or graft.  Do not allow blood draws, IVs, or blood pressure readings to be done in the arm that has your fistula or graft.  Do not allow flu shots or vaccinations in the arm with your fistula or graft. General instructions  Take over-the-counter and prescription medicines only as told by your health care provider.  Do not take baths, swim, or use a hot tub until your health care provider approves. Ask your health care provider if you may take showers. You may only be allowed to take sponge baths.  Monitor your dialysis fistula closely. Check to make sure that you can feel a vibration or buzz (a thrill) when you put your fingers over the fistula.  Keep all follow-up visits. This is important. Contact a health care provider if:  You have more redness, swelling, or pain at the site where the catheter was put in.  You have fluid or blood coming from the catheter site.  You have pus or a bad smell coming from the catheter site.  Your catheter site feels warm.  You have a fever or chills. Get help right away if:    You have bleeding from the vascular access site that does not stop.  You feel weak.  You have trouble balancing.  You have trouble moving your arms or legs.  You have problems with your speech or vision.  You can no longer feel a vibration or buzz when you put your fingers over your fistula.  The limb that was used for the procedure swells or becomes painful, cold, blue, or pale white.  You have chest pain or shortness of breath. These symptoms may represent a serious problem that is an emergency. Do not wait to see if the symptoms will go away. Get  medical help right away. Call your local emergency services (911 in the [place] Do not drive yourself to the hospital. Summary  After a dialysis fistulogram, it is common to have a small amount of discomfort or bruising in the area where the small, thin tube (catheter) was placed.  Rest as much as you can after your procedure. Return to your normal activities as told by your health care provider.  Take over-the-counter and prescription medicines only as told by your health care provider.  Follow instructions from your health care provider about how to take care of the site where the catheter was inserted.  Keep all follow-up visits. This is important. This information is not intended to replace advice given to you by your health care provider. Make sure you discuss any questions you have with your health care provider. Document Revised: 03/25/2020 Document Reviewed: 03/25/2020 Elsevier Patient Education  2021 [employer]  

## 2020-10-07 NOTE — Interval H&P Note (Signed)
History and Physical Interval Note:  10/07/2020 10:47 AM  Joseph Gilmore  has presented today for surgery, with the diagnosis of LT Fistulagram   End Stage Renal Covid  Jan 31.  The various methods of treatment have been discussed with the patient and family. After consideration of risks, benefits and other options for treatment, the patient has consented to  Procedure(s): A/V FISTULAGRAM (Left) as a surgical intervention.  The patient's history has been reviewed, patient examined, no change in status, stable for surgery.  I have reviewed the patient's chart and labs.  Questions were answered to the patient's satisfaction.     [name]

## 2020-10-07 NOTE — OR Nursing (Signed)
Attempts by 2 RN's for IV. Dr. Delana Meyer gave permission to draw K+ off of perm cath already in place, blood being drawn by Darryll Capers .

## 2020-10-07 NOTE — Op Note (Signed)
OPERATIVE NOTE   PROCEDURE: 1. Left arm brachiocephalic fistulogram  PRE-OPERATIVE DIAGNOSIS: Complication of dialysis access                                                       End Stage Renal Disease  POST-OPERATIVE DIAGNOSIS: same as above   SURGEON: Katha Cabal, M.D.  ANESTHESIA: Conscious sedation was administered under my direct supervision by the interventional radiology RN.  IV Versed plus fentanyl were utilized. Continuous ECG, pulse oximetry and blood pressure was monitored throughout the entire procedure.  Conscious sedation was for a total of 23 minutes 36 seconds.  ESTIMATED BLOOD LOSS: minimal  FINDING(S): 1. 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  SPECIMEN(S):  None  CONTRAST: 20 cc  FLUOROSCOPY TIME: 0.5 minutes  INDICATIONS: Joseph Gilmore is a 50 y.o. male who  presents with malfunctioning nonmaturing left AV access.  The patient is scheduled for angiography with possible intervention of the AV access to prevent loss of the permanent access.  The patient is aware the risks include but are not limited to: bleeding, infection, thrombosis of the cannulated access, and possible anaphylactic reaction to the contrast.  The patient acknowledges if the access can not be salvaged a tunneled catheter will be needed and will be placed during this procedure.  The patient is aware of the risks of the procedure and elects to proceed with the angiogram and intervention.  DESCRIPTION: After full informed written consent was obtained, the patient was brought back to the Special Procedure suite and placed supine position.  Appropriate cardiopulmonary monitors were placed.  The left arm was prepped and  draped in the standard fashion.  Appropriate timeout is called.   The left brachiocephalic access was cannulated with a micropuncture needle under ultrasound guidence.  Ultrasound was used to evaluate the left brachiocephalic access.  It was echolucent and compressible indicating it is patent .  An ultrasound image was acquired for the permanent record.  A micropuncture needle was used to access the left brachiocephalic access under direct ultrasound guidance.  The microwire was then advanced under fluoroscopic guidance without difficulty followed by the micro-sheath.  The J-wire was then advanced and a 6 Fr sheath inserted.  Hand injections were completed to image the access from the arterial anastomosis through the entire access.  The central venous structures were also imaged by hand injections.  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  As noted above there were 2 large tributaries which are maturing to a much greater degree than the cephalic vein likely secondary to the stenosis.  There is a 70% stenosis of the cephalic vein at the level of anastomosis.    A 4-0 Monocryl purse-string suture was sewn around the sheath.  The sheath was removed and light pressure was applied.  A sterile bandage was applied to the puncture site.    COMPLICATIONS: None CONDITION: Unchanged  Katha Cabal, M.D [facility] Office: [phone]  10/07/2020 11:20 AM

## 2020-10-15 ENCOUNTER — Ambulatory Visit (INDEPENDENT_AMBULATORY_CARE_PROVIDER_SITE_OTHER): Payer: Medicare (Managed Care) | Admitting: Vascular Surgery

## 2020-10-26 ENCOUNTER — Other Ambulatory Visit: Payer: Self-pay

## 2020-10-26 ENCOUNTER — Ambulatory Visit (INDEPENDENT_AMBULATORY_CARE_PROVIDER_SITE_OTHER): Payer: Medicare (Managed Care) | Admitting: Vascular Surgery

## 2020-10-26 VITALS — BP 93/66 | HR 76 | Ht 68.0 in | Wt 236.0 lb

## 2020-10-26 DIAGNOSIS — N186 End stage renal disease: Secondary | ICD-10-CM | POA: Diagnosis not present

## 2020-10-26 DIAGNOSIS — T829XXS Unspecified complication of cardiac and vascular prosthetic device, implant and graft, sequela: Secondary | ICD-10-CM | POA: Diagnosis not present

## 2020-10-26 DIAGNOSIS — I10 Essential (primary) hypertension: Secondary | ICD-10-CM

## 2020-11-01 ENCOUNTER — Encounter (INDEPENDENT_AMBULATORY_CARE_PROVIDER_SITE_OTHER): Payer: Self-pay | Admitting: Vascular Surgery

## 2020-11-01 DIAGNOSIS — T829XXA Unspecified complication of cardiac and vascular prosthetic device, implant and graft, initial encounter: Secondary | ICD-10-CM | POA: Insufficient documentation

## 2020-11-01 NOTE — Progress Notes (Signed)
MRN : [id number]  Joseph Gilmore is a 50 y.o. (1970/12/06) male who presents with chief complaint of  Chief Complaint  Patient presents with  . Follow-up    1 wk [facility] post AV fistulagram No studies  .  History of Present Illness:   The patient returns to the office for follow up regarding problem with the dialysis access. Currently the patient is maintained via a tunneled catheter because the fistula has not matured.    Recent angiography has shows several significant problems.  The patient notes a significant increase in bleeding time after decannulation.  The patient has also been informed that there is increased recirculation.    The patient denies hand pain or other symptoms consistent with steal phenomena.  No significant arm swelling.  The patient denies redness or swelling at the access site. The patient denies fever or chills at home or while on dialysis.  The patient denies amaurosis fugax or recent TIA symptoms. There are no recent neurological changes noted. The patient denies claudication symptoms or rest pain symptoms. The patient denies history of DVT, PE or superficial thrombophlebitis. The patient denies recent episodes of angina or shortness of breath.    Current Meds  Medication Sig  . B Complex-C-Folic Acid (RENA-VITE PO) Take 1 tablet by mouth daily.  . B Complex-C-Folic Acid (RENAL-VITE PO) Take by mouth.  . Cholecalciferol (VITAMIN D-3) 125 MCG (5000 UT) TABS Take 5,000 Units by mouth daily.  Marland Kitchen dutasteride (AVODART) 0.5 MG capsule Take 1 capsule (0.5 mg total) by mouth daily.  Marland Kitchen HYDROcodone-acetaminophen (NORCO) 5-325 MG tablet Take 1-2 tablets by mouth every 6 (six) hours as needed for moderate pain or severe pain.  . metoprolol tartrate (LOPRESSOR) 50 MG tablet Take 50 mg by mouth 2 (two) times daily.  . tamsulosin (FLOMAX) 0.4 MG CAPS capsule Take 1 capsule (0.4 mg total) by mouth daily. (Patient taking differently: Take 0.4 mg by mouth daily after  breakfast.)    Past Medical History:  Diagnosis Date  . Autism   . Chronic kidney disease   . Murmur   . OCD (obsessive compulsive disorder)     Past Surgical History:  Procedure Laterality Date  . A/V FISTULAGRAM Left 10/07/2020   Procedure: A/V FISTULAGRAM;  Surgeon: Katha Cabal, MD;  Location: [place] [facility];  Service: Cardiovascular;  Laterality: Left;  . AV FISTULA PLACEMENT Left 07/15/2020   Procedure: ARTERIOVENOUS FISTULA CREATION;  Surgeon: Katha Cabal, MD;  Location: [facility];  Service: Vascular;  Laterality: Left;  . DIALYSIS/PERMA CATHETER INSERTION N/A 04/06/2020   Procedure: DIALYSIS/PERMA CATHETER INSERTION;  Surgeon: Algernon Huxley, MD;  Location: [place] [facility];  Service: Cardiovascular;  Laterality: N/A;  . DIALYSIS/PERMA CATHETER INSERTION N/A 05/26/2020   Procedure: DIALYSIS/PERMA CATHETER INSERTION;  Surgeon: Katha Cabal, MD;  Location: [place] [facility];  Service: Cardiovascular;  Laterality: N/A;  . DIALYSIS/PERMA CATHETER INSERTION N/A 07/09/2020   Procedure: DIALYSIS/PERMA CATHETER INSERTION;  Surgeon: Algernon Huxley, MD;  Location: [place] [facility];  Service: Cardiovascular;  Laterality: N/A;    Social History Social History   Tobacco Use  . Smoking status: Never Smoker  . Smokeless tobacco: Never Used  Vaping Use  . Vaping Use: Never used  Substance Use Topics  . Alcohol use: Never  . Drug use: Never    Family History History reviewed. No pertinent family history.  Allergies  Allergen Reactions  . Chlorhexidine Other (See Comments)  Blisters      REVIEW OF SYSTEMS (Negative unless checked)  Constitutional: '[]'$ Weight loss  '[]'$ Fever  '[]'$ Chills Cardiac: '[]'$ Chest pain   '[]'$ Chest pressure   '[]'$ Palpitations   '[]'$ Shortness of breath when laying flat   '[]'$ Shortness of breath with exertion. Vascular:  '[]'$ Pain in legs with walking   '[]'$ Pain in legs at rest  '[]'$ History of DVT   '[]'$ Phlebitis   '[]'$ Swelling in legs   '[]'$ Varicose  veins   '[]'$ Non-healing ulcers Pulmonary:   '[]'$ Uses home oxygen   '[]'$ Productive cough   '[]'$ Hemoptysis   '[]'$ Wheeze  '[]'$ COPD   '[]'$ Asthma Neurologic:  '[]'$ Dizziness   '[]'$ Seizures   '[]'$ History of stroke   '[]'$ History of TIA  '[]'$ Aphasia   '[]'$ Vissual changes   '[]'$ Weakness or numbness in arm   '[]'$ Weakness or numbness in leg Musculoskeletal:   '[]'$ Joint swelling   '[]'$ Joint pain   '[]'$ Low back pain Hematologic:  '[]'$ Easy bruising  '[]'$ Easy bleeding   '[]'$ Hypercoagulable state   '[]'$ Anemic Gastrointestinal:  '[]'$ Diarrhea   '[]'$ Vomiting  '[]'$ Gastroesophageal reflux/heartburn   '[]'$ Difficulty swallowing. Genitourinary:  '[x]'$ Chronic kidney disease   '[]'$ Difficult urination  '[]'$ Frequent urination   '[]'$ Blood in urine Skin:  '[]'$ Rashes   '[]'$ Ulcers  Psychological:  '[]'$ History of anxiety   '[]'$  History of major depression.  Physical Examination  Vitals:   10/26/20 1624  BP: 93/66  Pulse: 76  Weight: 236 lb (107 kg)  Height: '5\' 8"'$  (1.727 m)   Body mass index is 35.88 kg/m. Gen: WD/WN, NAD Head: /AT, No temporalis wasting.  Ear/Nose/Throat: Hearing grossly intact, nares w/o erythema or drainage Eyes: PER, EOMI, sclera nonicteric.  Neck: Supple, no large masses.   Pulmonary:  Good air movement, no audible wheezing bilaterally, no use of accessory muscles.  Cardiac: RRR, no JVD Vascular: Left AV fistula not maturing good thrill by anastamosis Vessel Right Left  Radial Palpable Palpable  Brachial Palpable Palpable  Gastrointestinal: Non-distended. No guarding/no peritoneal signs.  Musculoskeletal: M/S 5/5 throughout.  No deformity or atrophy.  Neurologic: CN 2-12 intact. Symmetrical.  Speech is fluent. Motor exam as listed above. Psychiatric: Judgment intact, Mood & affect appropriate for pt's clinical situation. Dermatologic: No rashes or ulcers noted.  No changes consistent with cellulitis.  CBC Lab Results  Component Value Date   WBC 7.2 07/13/2020   HGB 11.9 (L) 07/13/2020   HCT 36.7 (L) 07/13/2020   MCV 90.8 07/13/2020   PLT 170  07/13/2020    BMET    Component Value Date/Time   NA 140 07/15/2020 0738   K 3.7 07/15/2020 0738   CL 99 07/15/2020 0738   CO2 24 07/15/2020 0738   GLUCOSE 92 07/15/2020 0738   BUN 47 (H) 07/15/2020 0738   CREATININE 13.83 (H) 07/15/2020 0738   CALCIUM 6.8 (L) 07/15/2020 0738   GFRNONAA 4 (L) 07/15/2020 0738   GFRAA 8 (L) 04/24/2020 1219   CrCl cannot be calculated (Patient's most recent lab result is older than the maximum 21 days allowed.).  COAG Lab Results  Component Value Date   INR 1.1 07/13/2020   INR 1.2 04/06/2020    Radiology PERIPHERAL VASCULAR CATHETERIZATION  Result Date: 10/07/2020 See op note     Assessment/Plan 1. Complication of vascular access for dialysis, sequela Recommend:  At this time the patient does not have appropriate extremity access for dialysis  Patient should have a revision of the left arm fistula in surgery, the stricture will need to be removed and the retrograde tributary removed.  The risks, benefits and alternative therapies were reviewed in  detail with the patient's father (his guardian).  All questions were answered.  The patient agrees to proceed with surgery.    A total of 35 minutes was spent with this patient and greater than 50% was spent in counseling and coordination of care with the patient.  Discussion included the treatment options for vascular disease including indications for surgery and intervention.  Also discussed is the appropriate timing of treatment.  In addition medical therapy was discussed.   2. End stage renal disease ([place]) At the present time the patient has adequate dialysis access.  Continue hemodialysis as ordered without interruption.  Avoid nephrotoxic medications and dehydration.  Further plans per nephrology  3. Essential hypertension Continue antihypertensive medications as already ordered, these medications have been reviewed and there are no changes at this time.     [name],  MD  11/01/2020 5:10 PM

## 2020-11-20 ENCOUNTER — Telehealth (INDEPENDENT_AMBULATORY_CARE_PROVIDER_SITE_OTHER): Payer: Self-pay

## 2020-11-20 NOTE — Telephone Encounter (Signed)
Spoke with the patient and he is scheduled with Dr. Delana Meyer for a left brachial cephalic AV fistula on [id number] at the [facility]. Pre-op phone call on 12/02/20 between 8-1 pm and covid testing on 12/09/20 between 8-2 pm at the [place]. Pre-surgical instructions were discussed and will be mailed.

## 2020-12-02 ENCOUNTER — Other Ambulatory Visit: Payer: Self-pay

## 2020-12-02 ENCOUNTER — Other Ambulatory Visit
Admission: RE | Admit: 2020-12-02 | Discharge: 2020-12-02 | Disposition: A | Discharge status: Home / Self Care | Payer: Medicare (Managed Care) | Source: Ambulatory Visit | Attending: Vascular Surgery | Admitting: Vascular Surgery

## 2020-12-02 ENCOUNTER — Other Ambulatory Visit (INDEPENDENT_AMBULATORY_CARE_PROVIDER_SITE_OTHER): Payer: Self-pay | Admitting: Nurse Practitioner

## 2020-12-02 HISTORY — DX: Essential (primary) hypertension: I10

## 2020-12-02 NOTE — Patient Instructions (Addendum)
Your procedure is scheduled on: December 11, 2020 FRIDAY Report to the Registration Desk on the 1st floor of the [facility]. To find out your arrival time, please call [phone] between 1PM - 3PM on: December 10, 2020 Thursday   REMEMBER: Instructions that are not followed completely may result in serious medical risk, up to and including death; or upon the discretion of your surgeon and anesthesiologist your surgery may need to be rescheduled.  Do not eat or drink after midnight the night before surgery.  No gum chewing, lozengers or hard candies.  TAKE THESE MEDICATIONS THE MORNING OF SURGERY WITH A SIP OF WATER: AVODART TAMSULOSIN  Follow recommendations from Cardiologist, Pulmonologist or PCP regarding stopping Aspirin, Coumadin, Plavix, Eliquis, Pradaxa, or Pletal.  One week prior to surgery: Stop Anti-inflammatories (NSAIDS) such as Advil, Aleve, Ibuprofen, Motrin, Naproxen, Naprosyn and Aspirin based products such as Excedrin, Goodys Powder, BC Powder.   USE TYLENOL IF NEEDED Stop ANY OVER THE COUNTER supplements until after surgery.  No Alcohol for 24 hours before or after surgery.  No Smoking including e-cigarettes for 24 hours prior to surgery.  No chewable tobacco products for at least 6 hours prior to surgery.  No nicotine patches on the day of surgery.  Do not use any "recreational" drugs for at least a week prior to your surgery.  Please be advised that the combination of cocaine and anesthesia may have negative outcomes, up to and including death. If you test positive for cocaine, your surgery will be cancelled.  On the morning of surgery brush your teeth with toothpaste and water, you may rinse your mouth with mouthwash if you wish. Do not swallow any toothpaste or mouthwash.  Do not wear jewelry, make-up, hairpins, clips or nail polish.  Do not wear lotions, powders, or perfumes OR DEODORANT    Do not shave body from the neck down 48 hours prior to surgery just  in case you cut yourself which could leave a site for infection.  Also, freshly shaved skin may become irritated if using the CHG soap.  Contact lenses, hearing aids and dentures may not be worn into surgery.  Do not bring valuables to the hospital. [facility] is not responsible for any missing/lost belongings or valuables.   SHOWER DAY OF SURGERY  Notify your doctor if there is any change in your medical condition (cold, fever, infection).  Wear comfortable clothing (specific to your surgery type) to the hospital.  Plan for stool softeners for home use; pain medications have a tendency to cause constipation. You can also help prevent constipation by eating foods high in fiber such as fruits and vegetables and drinking plenty of fluids as your diet allows.  After surgery, you can help prevent lung complications by doing breathing exercises.  Take deep breaths and cough every 1-2 hours. Your doctor may order a device called an Incentive Spirometer to help you take deep breaths. When coughing or sneezing, hold a pillow firmly against your incision with both hands. This is called "splinting." Doing this helps protect your incision. It also decreases belly discomfort.  If you are being discharged the day of surgery, you will not be allowed to drive home. You will need a responsible adult (18 years or older) to drive you home and stay with you that night.   If you are taking public transportation, you will need to have a responsible adult (18 years or older) with you. Please confirm with your physician that it is acceptable  to use public transportation.   Please call the [place] [facility] at [phone] if you have any questions about these instructions.  Surgery Visitation Policy:  Patients undergoing a surgery or procedure may have one family member or support person with them as long as that person is not COVID-19 positive or experiencing its symptoms.  That person may  remain in the waiting area during the procedure.  Inpatient Visitation:    Visiting hours are 7 a.m. to 8 p.m. Inpatients will be allowed two visitors daily. The visitors may change each day during the patient's stay. No visitors under the age of 69. Any visitor under the age of 70 must be accompanied by an adult. The visitor must pass COVID-19 screenings, use hand sanitizer when entering and exiting the patient's room and wear a mask at all times, including in the patient's room. Patients must also wear a mask when staff or their visitor are in the room. Masking is required regardless of vaccination status.

## 2020-12-09 ENCOUNTER — Other Ambulatory Visit: Payer: Self-pay

## 2020-12-09 ENCOUNTER — Other Ambulatory Visit
Admission: RE | Admit: 2020-12-09 | Discharge: 2020-12-09 | Disposition: A | Discharge status: Home / Self Care | Payer: Medicare HMO | Source: Ambulatory Visit | Attending: Vascular Surgery | Admitting: Vascular Surgery

## 2020-12-09 DIAGNOSIS — Z01818 Encounter for other preprocedural examination: Secondary | ICD-10-CM | POA: Diagnosis not present

## 2020-12-09 DIAGNOSIS — Z20822 Contact with and (suspected) exposure to covid-19: Secondary | ICD-10-CM | POA: Diagnosis not present

## 2020-12-09 LAB — BASIC METABOLIC PANEL
Anion gap: 15 (ref 5–15)
BUN: 33 mg/dL — ABNORMAL HIGH (ref 6–20)
CO2: 30 mmol/L (ref 22–32)
Calcium: 8.9 mg/dL (ref 8.9–10.3)
Chloride: 95 mmol/L — ABNORMAL LOW (ref 98–111)
Creatinine, Ser: 12.59 mg/dL — ABNORMAL HIGH (ref 0.61–1.24)
GFR, Estimated: 4 mL/min — ABNORMAL LOW (ref >60)
Glucose, Bld: 125 mg/dL — ABNORMAL HIGH (ref 70–99)
Potassium: 3.3 mmol/L — ABNORMAL LOW (ref 3.5–5.1)
Sodium: 140 mmol/L (ref 135–145)

## 2020-12-09 LAB — CBC WITH DIFFERENTIAL/PLATELET
Abs Immature Granulocytes: 0.04 10*3/uL (ref 0.00–0.07)
Basophils Absolute: 0 10*3/uL (ref 0.0–0.1)
Basophils Relative: 0 %
Eosinophils Absolute: 0.1 10*3/uL (ref 0.0–0.5)
Eosinophils Relative: 2 %
HCT: 30.7 % — ABNORMAL LOW (ref 39.0–52.0)
Hemoglobin: 10.3 g/dL — ABNORMAL LOW (ref 13.0–17.0)
Immature Granulocytes: 1 %
Lymphocytes Relative: 30 %
Lymphs Abs: 2.4 10*3/uL (ref 0.7–4.0)
MCH: 31.4 pg (ref 26.0–34.0)
MCHC: 33.6 g/dL (ref 30.0–36.0)
MCV: 93.6 fL (ref 80.0–100.0)
Monocytes Absolute: 0.7 10*3/uL (ref 0.1–1.0)
Monocytes Relative: 8 %
Neutro Abs: 4.7 10*3/uL (ref 1.7–7.7)
Neutrophils Relative %: 59 %
Platelets: 179 10*3/uL (ref 150–400)
RBC: 3.28 MIL/uL — ABNORMAL LOW (ref 4.22–5.81)
RDW: 17.5 % — ABNORMAL HIGH (ref 11.5–15.5)
WBC: 7.9 10*3/uL (ref 4.0–10.5)
nRBC: 0 % (ref 0.0–0.2)

## 2020-12-09 LAB — TYPE AND SCREEN
ABO/RH(D): O POS
Antibody Screen: NEGATIVE

## 2020-12-09 LAB — PROTIME-INR
INR: 1.1 (ref 0.8–1.2)
Prothrombin Time: 13.9 seconds (ref 11.4–15.2)

## 2020-12-09 LAB — SARS CORONAVIRUS 2 (TAT 6-24 HRS): SARS Coronavirus 2: NEGATIVE

## 2020-12-09 LAB — APTT: aPTT: 35 seconds (ref 24–36)

## 2020-12-11 ENCOUNTER — Encounter
Admission: RE | Disposition: A | Discharge status: Home / Self Care | Payer: Self-pay | Source: Home / Self Care | Attending: Vascular Surgery

## 2020-12-11 ENCOUNTER — Ambulatory Visit
Admission: RE | Admit: 2020-12-11 | Discharge: 2020-12-11 | Disposition: A | Discharge status: Home / Self Care | Payer: Medicare HMO | Attending: Vascular Surgery | Admitting: Vascular Surgery

## 2020-12-11 ENCOUNTER — Ambulatory Visit: Payer: Medicare HMO | Admitting: Anesthesiology

## 2020-12-11 ENCOUNTER — Other Ambulatory Visit: Payer: Self-pay

## 2020-12-11 DIAGNOSIS — Z992 Dependence on renal dialysis: Secondary | ICD-10-CM | POA: Diagnosis not present

## 2020-12-11 DIAGNOSIS — Y841 Kidney dialysis as the cause of abnormal reaction of the patient, or of later complication, without mention of misadventure at the time of the procedure: Secondary | ICD-10-CM | POA: Diagnosis not present

## 2020-12-11 DIAGNOSIS — Z79899 Other long term (current) drug therapy: Secondary | ICD-10-CM | POA: Diagnosis not present

## 2020-12-11 DIAGNOSIS — T82858A Stenosis of vascular prosthetic devices, implants and grafts, initial encounter: Secondary | ICD-10-CM | POA: Diagnosis not present

## 2020-12-11 DIAGNOSIS — I120 Hypertensive chronic kidney disease with stage 5 chronic kidney disease or end stage renal disease: Secondary | ICD-10-CM | POA: Insufficient documentation

## 2020-12-11 DIAGNOSIS — N186 End stage renal disease: Secondary | ICD-10-CM | POA: Diagnosis not present

## 2020-12-11 DIAGNOSIS — N185 Chronic kidney disease, stage 5: Secondary | ICD-10-CM | POA: Diagnosis not present

## 2020-12-11 DIAGNOSIS — T82510A Breakdown (mechanical) of surgically created arteriovenous fistula, initial encounter: Secondary | ICD-10-CM | POA: Diagnosis present

## 2020-12-11 HISTORY — PX: REVISON OF ARTERIOVENOUS FISTULA: SHX6074

## 2020-12-11 LAB — POCT I-STAT, CHEM 8
BUN: 40 mg/dL — ABNORMAL HIGH (ref 6–20)
Calcium, Ion: 1.04 mmol/L — ABNORMAL LOW (ref 1.15–1.40)
Chloride: 97 mmol/L — ABNORMAL LOW (ref 98–111)
Creatinine, Ser: 12.1 mg/dL — ABNORMAL HIGH (ref 0.61–1.24)
Glucose, Bld: 85 mg/dL (ref 70–99)
HCT: 33 % — ABNORMAL LOW (ref 39.0–52.0)
Hemoglobin: 11.2 g/dL — ABNORMAL LOW (ref 13.0–17.0)
Potassium: 4 mmol/L (ref 3.5–5.1)
Sodium: 140 mmol/L (ref 135–145)
TCO2: 30 mmol/L (ref 22–32)

## 2020-12-11 SURGERY — REVISON OF ARTERIOVENOUS FISTULA
Anesthesia: General | Laterality: Left

## 2020-12-11 MED ORDER — ONDANSETRON HCL 4 MG/2ML IJ SOLN: 4.0000 mg | Freq: Four times a day (QID) | INTRAMUSCULAR | Status: DC | PRN

## 2020-12-11 MED ORDER — BUPIVACAINE HCL (PF) 0.5 % IJ SOLN
INTRAMUSCULAR | Status: AC
  Filled 2020-12-11: qty 30

## 2020-12-11 MED ORDER — LIDOCAINE HCL (PF) 2 % IJ SOLN
INTRAMUSCULAR | Status: AC
  Filled 2020-12-11: qty 5

## 2020-12-11 MED ORDER — DEXAMETHASONE SODIUM PHOSPHATE 10 MG/ML IJ SOLN
INTRAMUSCULAR | Status: DC | PRN
  Administered 2020-12-11: 10 mg via INTRAVENOUS

## 2020-12-11 MED ORDER — ACETAMINOPHEN 10 MG/ML IV SOLN
INTRAVENOUS | Status: AC
  Filled 2020-12-11: qty 100

## 2020-12-11 MED ORDER — HEPARIN SODIUM (PORCINE) 5000 UNIT/ML IJ SOLN
INTRAMUSCULAR | Status: AC
  Filled 2020-12-11: qty 1

## 2020-12-11 MED ORDER — LIDOCAINE HCL (CARDIAC) PF 100 MG/5ML IV SOSY
PREFILLED_SYRINGE | INTRAVENOUS | Status: DC | PRN
  Administered 2020-12-11: 100 mg via INTRAVENOUS

## 2020-12-11 MED ORDER — ACETAMINOPHEN 10 MG/ML IV SOLN
INTRAVENOUS | Status: DC | PRN
  Administered 2020-12-11: 1000 mg via INTRAVENOUS

## 2020-12-11 MED ORDER — GLYCOPYRROLATE 0.2 MG/ML IJ SOLN
INTRAMUSCULAR | Status: AC
  Filled 2020-12-11: qty 1

## 2020-12-11 MED ORDER — EPHEDRINE SULFATE 50 MG/ML IJ SOLN
INTRAMUSCULAR | Status: DC | PRN
  Administered 2020-12-11: 5 mg via INTRAVENOUS

## 2020-12-11 MED ORDER — ONDANSETRON HCL 4 MG/2ML IJ SOLN
INTRAMUSCULAR | Status: AC
  Filled 2020-12-11: qty 2

## 2020-12-11 MED ORDER — CEFAZOLIN SODIUM-DEXTROSE 1-4 GM/50ML-% IV SOLN
1.0000 g | INTRAVENOUS | Status: AC
  Administered 2020-12-11: 2 g via INTRAVENOUS

## 2020-12-11 MED ORDER — PROPOFOL 10 MG/ML IV BOLUS
INTRAVENOUS | Status: DC | PRN
  Administered 2020-12-11 (×2): 100 mg via INTRAVENOUS
  Administered 2020-12-11: 40 mg via INTRAVENOUS

## 2020-12-11 MED ORDER — BUPIVACAINE HCL (PF) 0.5 % IJ SOLN
INTRAMUSCULAR | Status: DC | PRN
  Administered 2020-12-11: 30 mL

## 2020-12-11 MED ORDER — FENTANYL CITRATE (PF) 100 MCG/2ML IJ SOLN
INTRAMUSCULAR | Status: DC | PRN
  Administered 2020-12-11 (×8): 25 ug via INTRAVENOUS

## 2020-12-11 MED ORDER — DEXAMETHASONE SODIUM PHOSPHATE 10 MG/ML IJ SOLN
INTRAMUSCULAR | Status: AC
  Filled 2020-12-11: qty 1

## 2020-12-11 MED ORDER — BUPIVACAINE LIPOSOME 1.3 % IJ SUSP
INTRAMUSCULAR | Status: DC | PRN
  Administered 2020-12-11: 20 mL

## 2020-12-11 MED ORDER — BUPIVACAINE LIPOSOME 1.3 % IJ SUSP
INTRAMUSCULAR | Status: AC
  Filled 2020-12-11: qty 20

## 2020-12-11 MED ORDER — FAMOTIDINE 20 MG PO TABS
ORAL_TABLET | ORAL | Status: AC
  Administered 2020-12-11: 20 mg via ORAL
  Filled 2020-12-11: qty 1

## 2020-12-11 MED ORDER — EPHEDRINE 5 MG/ML INJ
INTRAVENOUS | Status: AC
  Filled 2020-12-11: qty 10

## 2020-12-11 MED ORDER — HYDROCODONE-ACETAMINOPHEN 5-325 MG PO TABS: 1.0000 | ORAL_TABLET | Freq: Four times a day (QID) | ORAL | 0 refills | Status: DC | PRN

## 2020-12-11 MED ORDER — FENTANYL CITRATE (PF) 100 MCG/2ML IJ SOLN
INTRAMUSCULAR | Status: AC
  Filled 2020-12-11: qty 2

## 2020-12-11 MED ORDER — SODIUM CHLORIDE 0.9 % IV SOLN: INTRAVENOUS | Status: DC | PRN

## 2020-12-11 MED ORDER — HYDROMORPHONE HCL 1 MG/ML IJ SOLN: 1.0000 mg | Freq: Once | INTRAMUSCULAR | Status: DC | PRN

## 2020-12-11 MED ORDER — CEFAZOLIN SODIUM-DEXTROSE 1-4 GM/50ML-% IV SOLN
INTRAVENOUS | Status: AC
  Filled 2020-12-11: qty 50

## 2020-12-11 MED ORDER — ONDANSETRON HCL 4 MG/2ML IJ SOLN
INTRAMUSCULAR | Status: DC | PRN
  Administered 2020-12-11: 4 mg via INTRAVENOUS

## 2020-12-11 MED ORDER — SODIUM CHLORIDE 0.9 % IV SOLN
INTRAVENOUS | Status: DC | PRN
  Administered 2020-12-11: 200 mL via INTRAMUSCULAR

## 2020-12-11 MED ORDER — SODIUM CHLORIDE 0.9 % IV SOLN
INTRAVENOUS | Status: DC | PRN
  Administered 2020-12-11: 50 ug/min via INTRAVENOUS

## 2020-12-11 MED ORDER — FENTANYL CITRATE (PF) 100 MCG/2ML IJ SOLN: 25.0000 ug | INTRAMUSCULAR | Status: DC | PRN

## 2020-12-11 MED ORDER — ONDANSETRON HCL 4 MG/2ML IJ SOLN: 4.0000 mg | Freq: Once | INTRAMUSCULAR | Status: DC | PRN

## 2020-12-11 MED ORDER — GLYCOPYRROLATE 0.2 MG/ML IJ SOLN
INTRAMUSCULAR | Status: DC | PRN
  Administered 2020-12-11: .2 mg via INTRAVENOUS

## 2020-12-11 MED ORDER — FAMOTIDINE 20 MG PO TABS: 20.0000 mg | ORAL_TABLET | Freq: Once | ORAL | Status: AC

## 2020-12-11 MED ORDER — PHENYLEPHRINE HCL (PRESSORS) 10 MG/ML IV SOLN
INTRAVENOUS | Status: DC | PRN
  Administered 2020-12-11: 100 ug via INTRAVENOUS
  Administered 2020-12-11: 200 ug via INTRAVENOUS
  Administered 2020-12-11: 400 ug via INTRAVENOUS
  Administered 2020-12-11 (×3): 200 ug via INTRAVENOUS
  Administered 2020-12-11 (×2): 100 ug via INTRAVENOUS

## 2020-12-11 MED ORDER — SODIUM CHLORIDE 0.9 % IV SOLN: INTRAVENOUS | Status: DC

## 2020-12-11 SURGICAL SUPPLY — 61 items
ADH SKN CLS APL DERMABOND .7 (GAUZE/BANDAGES/DRESSINGS) ×1
APPLIER CLIP 11 MED OPEN (CLIP)
APPLIER CLIP 9.375 SM OPEN (CLIP)
APR CLP MED 11 20 MLT OPN (CLIP)
APR CLP SM 9.3 20 MLT OPN (CLIP)
BAG DECANTER FOR FLEXI CONT (MISCELLANEOUS) ×2 IMPLANT
BLADE SURG SZ11 CARB STEEL (BLADE) ×2 IMPLANT
BOOT SUTURE AID YELLOW STND (SUTURE) ×2 IMPLANT
CLIP APPLIE 11 MED OPEN (CLIP) IMPLANT
CLIP APPLIE 9.375 SM OPEN (CLIP) IMPLANT
COVER PROBE U/S 5X48 (MISCELLANEOUS) ×2 IMPLANT
COVER WAND RF STERILE (DRAPES) ×2 IMPLANT
DERMABOND ADVANCED (GAUZE/BANDAGES/DRESSINGS) ×1
DERMABOND ADVANCED .7 DNX12 (GAUZE/BANDAGES/DRESSINGS) ×1 IMPLANT
DRESSING SURGICEL FIBRLLR 1X2 (HEMOSTASIS) ×1 IMPLANT
DRSG SURGICEL FIBRILLAR 1X2 (HEMOSTASIS) ×2
DURAPREP 26ML APPLICATOR (WOUND CARE) ×1 IMPLANT
ELECT CAUTERY BLADE 6.4 (BLADE) ×2 IMPLANT
ELECT REM PT RETURN 9FT ADLT (ELECTROSURGICAL) ×2
ELECTRODE REM PT RTRN 9FT ADLT (ELECTROSURGICAL) ×1 IMPLANT
GEL ULTRASOUND 20GR AQUASONIC (MISCELLANEOUS) IMPLANT
GLOVE SURG ENC MOIS LTX SZ7 (GLOVE) ×2 IMPLANT
GLOVE SURG SYN 8.0 (GLOVE) ×2 IMPLANT
GLOVE SURG SYN 8.0 PF PI (GLOVE) ×1 IMPLANT
GLOVE SURG UNDER LTX SZ7.5 (GLOVE) ×2 IMPLANT
GOWN STRL REUS W/ TWL LRG LVL3 (GOWN DISPOSABLE) ×1 IMPLANT
GOWN STRL REUS W/ TWL XL LVL3 (GOWN DISPOSABLE) ×2 IMPLANT
GOWN STRL REUS W/TWL LRG LVL3 (GOWN DISPOSABLE) ×2
GOWN STRL REUS W/TWL XL LVL3 (GOWN DISPOSABLE) ×4
IV NS 500ML (IV SOLUTION) ×2
IV NS 500ML BAXH (IV SOLUTION) ×1 IMPLANT
KIT TURNOVER KIT A (KITS) ×2 IMPLANT
LABEL OR SOLS (LABEL) ×2 IMPLANT
LOOP RED MAXI  1X406MM (MISCELLANEOUS) ×1
LOOP VESSEL MAXI  1X406 RED (MISCELLANEOUS) ×1
LOOP VESSEL MAXI 1X406 RED (MISCELLANEOUS) ×1 IMPLANT
LOOP VESSEL MINI 0.8X406 BLUE (MISCELLANEOUS) ×2 IMPLANT
LOOPS BLUE MINI 0.8X406MM (MISCELLANEOUS) ×2
MANIFOLD NEPTUNE II (INSTRUMENTS) ×2 IMPLANT
NDL FILTER BLUNT 18X1 1/2 (NEEDLE) ×1 IMPLANT
NDL HYPO 30X.5 LL (NEEDLE) IMPLANT
NEEDLE FILTER BLUNT 18X 1/2SAF (NEEDLE) ×1
NEEDLE FILTER BLUNT 18X1 1/2 (NEEDLE) ×1 IMPLANT
NEEDLE HYPO 30X.5 LL (NEEDLE) IMPLANT
PACK EXTREMITY ARMC (MISCELLANEOUS) ×2 IMPLANT
PAD PREP 24X41 OB/GYN DISP (PERSONAL CARE ITEMS) ×2 IMPLANT
STOCKINETTE 48X4 2 PLY STRL (GAUZE/BANDAGES/DRESSINGS) ×1 IMPLANT
STOCKINETTE STRL 4IN 9604848 (GAUZE/BANDAGES/DRESSINGS) ×2 IMPLANT
SUT MNCRL+ 5-0 UNDYED PC-3 (SUTURE) ×1 IMPLANT
SUT MONOCRYL 5-0 (SUTURE) ×4
SUT PROLENE 6 0 BV (SUTURE) ×16 IMPLANT
SUT SILK 2 0 (SUTURE) ×2
SUT SILK 2-0 18XBRD TIE 12 (SUTURE) ×1 IMPLANT
SUT SILK 3 0 (SUTURE) ×2
SUT SILK 3-0 18XBRD TIE 12 (SUTURE) ×1 IMPLANT
SUT SILK 4 0 (SUTURE) ×2
SUT SILK 4-0 18XBRD TIE 12 (SUTURE) ×1 IMPLANT
SUT VIC AB 3-0 SH 27 (SUTURE) ×6
SUT VIC AB 3-0 SH 27X BRD (SUTURE) ×1 IMPLANT
SYR 20ML LL LF (SYRINGE) ×2 IMPLANT
SYR 3ML LL SCALE MARK (SYRINGE) ×2 IMPLANT

## 2020-12-11 NOTE — Interval H&P Note (Signed)
History and Physical Interval Note:  12/11/2020 10:06 AM  Joseph Gilmore  has presented today for surgery, with the diagnosis of ESRD.  The various methods of treatment have been discussed with the patient and family. After consideration of risks, benefits and other options for treatment, the patient has consented to  Procedure(s): ARTERIOVENOUS (AV) FISTULA CREATION ( BRACHIAL CEPHALIC ) (Left) as a surgical intervention.  The patient's history has been reviewed, patient examined, no change in status, stable for surgery.  I have reviewed the patient's chart and labs.  Questions were answered to the patient's satisfaction.     [name]

## 2020-12-11 NOTE — Discharge Instructions (Addendum)
Bupivacaine Liposomal Suspension for Injection  (EXPAREL)  REMOVE GREEN BRACELET IN 3 DAYS What is this medicine? BUPIVACAINE LIPOSOMAL (bue PIV a kane LIP oh som al) is an anesthetic. It causes loss of feeling in the skin or other tissues. It is used to prevent and to treat pain from some procedures. This medicine may be used for other purposes; ask your health care provider or pharmacist if you have questions. COMMON BRAND NAME(S): EXPAREL What should I tell my health care provider before I take this medicine? They need to know if you have any of these conditions:  G6PD deficiency  heart disease  kidney disease  liver disease  low blood pressure  lung or breathing disease, like asthma  an unusual or allergic reaction to bupivacaine, other medicines, foods, dyes, or preservatives  pregnant or trying to get pregnant  breast-feeding How should I use this medicine? This medicine is injected into the affected area. It is given by a health care provider in a hospital or clinic setting. Talk to your health care provider about the use of this medicine in children. While it may be given to children as young as 6 years for selected conditions, precautions do apply. Overdosage: If you think you have taken too much of this medicine contact a poison control center or emergency room at once. NOTE: This medicine is only for you. Do not share this medicine with others. What if I miss a dose? This does not apply. What may interact with this medicine? This medicine may interact with the following medications:  acetaminophen  certain antibiotics like dapsone, nitrofurantoin, aminosalicylic acid, sulfonamides  certain medicines for seizures like phenobarbital, phenytoin, valproic acid  chloroquine  cyclophosphamide  flutamide  hydroxyurea  ifosfamide  metoclopramide  nitric oxide  nitroglycerin  nitroprusside  nitrous oxide  other local anesthetics like lidocaine,  pramoxine, tetracaine  primaquine  quinine  rasburicase  sulfasalazine This list may not describe all possible interactions. Give your health care provider a list of all the medicines, herbs, non-prescription drugs, or dietary supplements you use. Also tell them if you smoke, drink alcohol, or use illegal drugs. Some items may interact with your medicine. What should I watch for while using this medicine? Your condition will be monitored carefully while you are receiving this medicine. Be careful to avoid injury while the area is numb, and you are not aware of pain. What side effects may I notice from receiving this medicine? Side effects that you should report to your doctor or health care professional as soon as possible:  allergic reactions like skin rash, itching or hives, swelling of the face, lips, or tongue  seizures  signs and symptoms of a dangerous change in heartbeat or heart rhythm like chest pain; dizziness; fast, irregular heartbeat; palpitations; feeling faint or lightheaded; falls; breathing problems  signs and symptoms of methemoglobinemia such as pale, gray, or blue colored skin; headache; fast heartbeat; shortness of breath; feeling faint or lightheaded, falls; tiredness Side effects that usually do not require medical attention (report to your doctor or health care professional if they continue or are bothersome):  anxious  back pain  changes in taste  changes in vision  constipation  dizziness  fever  nausea, vomiting This list may not describe all possible side effects. Call your doctor for medical advice about side effects. You may report side effects to FDA at 1-800-FDA-1088. Where should I keep my medicine? This drug is given in a hospital or clinic and will not  be stored at home. NOTE: This sheet is a summary. It may not cover all possible information. If you have questions about this medicine, talk to your doctor, pharmacist, or health care  provider.  2021 Elsevier/Gold Standard (2019-11-21 12:24:57)     AMBULATORY SURGERY  DISCHARGE INSTRUCTIONS   1) The drugs that you were given will stay in your system until tomorrow so for the next 24 hours you should not:  A) Drive an automobile B) Make any legal decisions C) Drink any alcoholic beverage   2) You may resume regular meals tomorrow.  Today it is better to start with liquids and gradually work up to solid foods.  You may eat anything you prefer, but it is better to start with liquids, then soup and crackers, and gradually work up to solid foods.   3) Please notify your doctor immediately if you have any unusual bleeding, trouble breathing, redness and pain at the surgery site, drainage, fever, or pain not relieved by medication.    4) Additional Instructions:        Please contact your physician with any problems or Same Day Surgery at [phone], Monday through Friday 6 am to 4 pm, or  at [facility] number at [phone].

## 2020-12-11 NOTE — H&P (Signed)
$'@LOGO'x$ @   MRN : [id number]  Joseph Gilmore is a 50 y.o. (1971-06-15) male who presents with chief complaint of No chief complaint on file. Marland Kitchen  History of Present Illness:   Patient presents to [facility] for revision of his AV fistula.  Currently the patient is maintained via a tunneled catheter because the fistula has not matured.    Recent angiography has shows several significant problems.  The patient notes a significant increase in bleeding time after decannulation.  The patient has also been informed that there is increased recirculation.    The patient denies hand pain or other symptoms consistent with steal phenomena.  No significant arm swelling.  The patient denies redness or swelling at the access site. The patient denies fever or chills at home or while on dialysis.  The patient denies amaurosis fugax or recent TIA symptoms. There are no recent neurological changes noted. The patient denies claudication symptoms or rest pain symptoms. The patient denies history of DVT, PE or superficial thrombophlebitis. The patient denies recent episodes of angina or shortness of breath.   Current Meds  Medication Sig  . B Complex-C-Folic Acid (RENA-VITE PO) Take 1 tablet by mouth daily.  . Cholecalciferol (VITAMIN D-3) 125 MCG (5000 UT) TABS Take 5,000 Units by mouth daily.  Marland Kitchen dutasteride (AVODART) 0.5 MG capsule Take 1 capsule (0.5 mg total) by mouth daily.  Marland Kitchen losartan (COZAAR) 100 MG tablet Take 50 mg by mouth daily.  . tamsulosin (FLOMAX) 0.4 MG CAPS capsule Take 1 capsule (0.4 mg total) by mouth daily. (Patient taking differently: Take 0.4 mg by mouth daily after breakfast.)    Past Medical History:  Diagnosis Date  . Autism   . Chronic kidney disease   . Hypertension   . Murmur   . OCD (obsessive compulsive disorder)     Past Surgical History:  Procedure Laterality Date  . A/V FISTULAGRAM Left 10/07/2020   Procedure: A/V FISTULAGRAM;  Surgeon:  Katha Cabal, MD;  Location: [place] [facility];  Service: Cardiovascular;  Laterality: Left;  . AV FISTULA PLACEMENT Left 07/15/2020   Procedure: ARTERIOVENOUS FISTULA CREATION;  Surgeon: Katha Cabal, MD;  Location: [facility];  Service: Vascular;  Laterality: Left;  . DIALYSIS/PERMA CATHETER INSERTION N/A 04/06/2020   Procedure: DIALYSIS/PERMA CATHETER INSERTION;  Surgeon: Algernon Huxley, MD;  Location: [place] [facility];  Service: Cardiovascular;  Laterality: N/A;  . DIALYSIS/PERMA CATHETER INSERTION N/A 05/26/2020   Procedure: DIALYSIS/PERMA CATHETER INSERTION;  Surgeon: Katha Cabal, MD;  Location: [place] [facility];  Service: Cardiovascular;  Laterality: N/A;  . DIALYSIS/PERMA CATHETER INSERTION N/A 07/09/2020   Procedure: DIALYSIS/PERMA CATHETER INSERTION;  Surgeon: Algernon Huxley, MD;  Location: [place] [facility];  Service: Cardiovascular;  Laterality: N/A;    Social History Social History   Tobacco Use  . Smoking status: Never Smoker  . Smokeless tobacco: Never Used  Vaping Use  . Vaping Use: Never used  Substance Use Topics  . Alcohol use: Never  . Drug use: Never    Family History No family history on file.  Allergies  Allergen Reactions  . Chlorhexidine Other (See Comments)    Blisters (topical)     REVIEW OF SYSTEMS (Negative unless checked)  Constitutional: '[]'$ Weight loss  '[]'$ Fever  '[]'$ Chills Cardiac: '[]'$ Chest pain   '[]'$ Chest pressure   '[]'$ Palpitations   '[]'$ Shortness of breath when laying flat   '[]'$ Shortness of breath with exertion. Vascular:  '[]'$ Pain in legs with walking   '[]'$   Pain in legs at rest  '[]'$ History of DVT   '[]'$ Phlebitis   '[]'$ Swelling in legs   '[]'$ Varicose veins   '[]'$ Non-healing ulcers Pulmonary:   '[]'$ Uses home oxygen   '[]'$ Productive cough   '[]'$ Hemoptysis   '[]'$ Wheeze  '[]'$ COPD   '[]'$ Asthma Neurologic:  '[]'$ Dizziness   '[]'$ Seizures   '[]'$ History of stroke   '[]'$ History of TIA  '[]'$ Aphasia   '[]'$ Vissual changes   '[]'$ Weakness or numbness in arm   '[]'$ Weakness or numbness  in leg Musculoskeletal:   '[]'$ Joint swelling   '[]'$ Joint pain   '[]'$ Low back pain Hematologic:  '[]'$ Easy bruising  '[]'$ Easy bleeding   '[]'$ Hypercoagulable state   '[]'$ Anemic Gastrointestinal:  '[]'$ Diarrhea   '[]'$ Vomiting  '[]'$ Gastroesophageal reflux/heartburn   '[]'$ Difficulty swallowing. Genitourinary:  '[x]'$ Chronic kidney disease   '[]'$ Difficult urination  '[]'$ Frequent urination   '[]'$ Blood in urine Skin:  '[]'$ Rashes   '[]'$ Ulcers  Psychological:  '[]'$ History of anxiety   '[]'$  History of major depression.  Physical Examination  Vitals:   12/11/20 0835  BP: 130/61  Pulse: 88  Resp: 18  Temp: 97.6 F (36.4 C)  TempSrc: Temporal  SpO2: 100%   There is no height or weight on file to calculate BMI. Gen: WD/WN, NAD Head: [place]/AT, No temporalis wasting.  Ear/Nose/Throat: Hearing grossly intact, nares w/o erythema or drainage Eyes: PER, EOMI, sclera nonicteric.  Neck: Supple, no large masses.   Pulmonary:  Good air movement, no audible wheezing bilaterally, no use of accessory muscles.  Cardiac: RRR, no JVD Vascular: Left brachiocephalic fistula with a good thrill good bruit Vessel Right Left  Radial Palpable Palpable  Brachial Palpable Palpable  Gastrointestinal: Non-distended. No guarding/no peritoneal signs.  Musculoskeletal: M/S 5/5 throughout.  No deformity or atrophy.  Neurologic: CN 2-12 intact. Symmetrical.  Speech is fluent. Motor exam as listed above. Psychiatric: Judgment intact, Mood & affect appropriate for pt's clinical situation. Dermatologic: No rashes or ulcers noted.  No changes consistent with cellulitis.   CBC Lab Results  Component Value Date   WBC 7.9 12/09/2020   HGB 11.2 (L) 12/11/2020   HCT 33.0 (L) 12/11/2020   MCV 93.6 12/09/2020   PLT 179 12/09/2020    BMET    Component Value Date/Time   NA 140 12/11/2020 0920   K 4.0 12/11/2020 0920   CL 97 (L) 12/11/2020 0920   CO2 30 12/09/2020 1012   GLUCOSE 85 12/11/2020 0920   BUN 40 (H) 12/11/2020 0920   CREATININE 12.10 (H) 12/11/2020  0920   CALCIUM 8.9 12/09/2020 1012   GFRNONAA 4 (L) 12/09/2020 1012   GFRAA 8 (L) 04/24/2020 1219   Estimated Creatinine Clearance: 8.6 mL/min (A) (by C-G formula based on SCr of 12.1 mg/dL (H)).  COAG Lab Results  Component Value Date   INR 1.1 12/09/2020   INR 1.1 07/13/2020   INR 1.2 04/06/2020    Radiology No results found.   Assessment/Plan 1. Complication of vascular access for dialysis, sequela Recommend:  At this time the patient does not have appropriate extremity access for dialysis  Patient should have a revision of the left arm fistula in surgery, the stricture will need to be removed and the retrograde tributary removed.  The risks, benefits and alternative therapies were reviewed in detail with the patient's father (his guardian).  All questions were answered.  The patient agrees to proceed with surgery.     2. End stage renal disease ([place]) At the present time the patient has adequate dialysis access.  Continue hemodialysis as ordered without interruption.  Avoid nephrotoxic  medications and dehydration.  Further plans per nephrology  3. Essential hypertension Continue antihypertensive medications as already ordered, these medications have been reviewed and there are no changes at this time.  [name], MD  12/11/2020 10:03 AM

## 2020-12-11 NOTE — Anesthesia Preprocedure Evaluation (Addendum)
Anesthesia Evaluation  Patient identified by MRN, date of birth, ID band Patient awake    Reviewed: Allergy & Precautions, NPO status , Patient's Chart, lab work & pertinent test results  History of Anesthesia Complications Negative for: history of anesthetic complications  Airway Mallampati: III       Dental   Pulmonary neg sleep apnea, neg COPD, Not current smoker,           Cardiovascular hypertension, Pt. on medications (-) Past MI and (-) CHF (-) dysrhythmias + Valvular Problems/Murmurs (as a child )      Neuro/Psych neg Seizures Anxiety Mental retardation    GI/Hepatic Neg liver ROS, neg GERD  ,  Endo/Other  neg diabetes  Renal/GU ESRF and DialysisRenal disease     Musculoskeletal   Abdominal   Peds  Hematology  (+) anemia ,   Anesthesia Other Findings   Reproductive/Obstetrics                            Anesthesia Physical Anesthesia Plan  ASA: IV  Anesthesia Plan: General   Post-op Pain Management:    Induction: Intravenous  PONV Risk Score and Plan: 2 and Ondansetron and Dexamethasone  Airway Management Planned: LMA  Additional Equipment:   Intra-op Plan:   Post-operative Plan:   Informed Consent: I have reviewed the patients History and Physical, chart, labs and discussed the procedure including the risks, benefits and alternatives for the proposed anesthesia with the patient or authorized representative who has indicated his/her understanding and acceptance.       Plan Discussed with:   Anesthesia Plan Comments:         Anesthesia Quick Evaluation

## 2020-12-11 NOTE — Progress Notes (Signed)
Patient is doing well in recovery, patient surgical site is soft to touch, no ecchymosis, no pain. Extremity is elevated on a pillow. Patient is stable and comfortable.

## 2020-12-11 NOTE — Anesthesia Postprocedure Evaluation (Signed)
Anesthesia Post Note  Patient: Schneider Gavins Leather[place] II  Procedure(s) Performed: ARTERIOVENOUS (AV) FISTULA CREATION ( BRACHIAL CEPHALIC ) (Left )  Patient location during evaluation: PACU Anesthesia Type: General Level of consciousness: awake and alert Pain management: pain level controlled Vital Signs Assessment: post-procedure vital signs reviewed and stable Respiratory status: spontaneous breathing and respiratory function stable Cardiovascular status: stable Anesthetic complications: no   No complications documented.   Last Vitals:  Vitals:   12/11/20 1415 12/11/20 1439  BP: 130/89 115/72  Pulse: 98 88  Resp: 18 18  Temp:  36.4 C  SpO2: 100% 99%    Last Pain:  Vitals:   12/11/20 1439  TempSrc: Temporal  PainSc: 0-No pain                 Demondre Aguas K

## 2020-12-11 NOTE — OR Nursing (Addendum)
Discharge has been pending MD visit to postop to review incisional area, some areas of bleeding noted.  States he will be here as soon as he can, per tele 3:55 pm. (secure-chat PACU staff earlier)  Update 1615 - Dr. Delana Meyer in to see pt, Honeycomb dressing applied as instructed.

## 2020-12-11 NOTE — Transfer of Care (Signed)
Immediate Anesthesia Transfer of Care Note  Patient: Joseph Gilmore  Procedure(s) Performed: ARTERIOVENOUS (AV) FISTULA CREATION ( BRACHIAL CEPHALIC ) (Left )  Patient Location: PACU  Anesthesia Type:General  Level of Consciousness: drowsy  Airway & Oxygen Therapy: Patient Spontanous Breathing  Post-op Assessment: Report given to RN and Post -op Vital signs reviewed and stable  Post vital signs: Reviewed and stable  Last Vitals:  Vitals Value Taken Time  BP 110/65 12/11/20 1340  Temp    Pulse 96 12/11/20 1345  Resp 15 12/11/20 1345  SpO2 98 % 12/11/20 1345  Vitals shown include unvalidated device data.  Last Pain:  Vitals:   12/11/20 0835  TempSrc: Temporal  PainSc: 0-No pain         Complications: No complications documented.

## 2020-12-11 NOTE — H&P (View-Only) (Signed)
$'@LOGO'D$ @   MRN : [id number]  Joseph Gilmore is a 50 y.o. (1971/08/09) male who presents with chief complaint of No chief complaint on file. Marland Kitchen  History of Present Illness:   Patient presents to [facility] for revision of his AV fistula.  Currently the patient is maintained via a tunneled catheter because the fistula has not matured.    Recent angiography has shows several significant problems.  The patient notes a significant increase in bleeding time after decannulation.  The patient has also been informed that there is increased recirculation.    The patient denies hand pain or other symptoms consistent with steal phenomena.  No significant arm swelling.  The patient denies redness or swelling at the access site. The patient denies fever or chills at home or while on dialysis.  The patient denies amaurosis fugax or recent TIA symptoms. There are no recent neurological changes noted. The patient denies claudication symptoms or rest pain symptoms. The patient denies history of DVT, PE or superficial thrombophlebitis. The patient denies recent episodes of angina or shortness of breath.   Current Meds  Medication Sig  . B Complex-C-Folic Acid (RENA-VITE PO) Take 1 tablet by mouth daily.  . Cholecalciferol (VITAMIN D-3) 125 MCG (5000 UT) TABS Take 5,000 Units by mouth daily.  Marland Kitchen dutasteride (AVODART) 0.5 MG capsule Take 1 capsule (0.5 mg total) by mouth daily.  Marland Kitchen losartan (COZAAR) 100 MG tablet Take 50 mg by mouth daily.  . tamsulosin (FLOMAX) 0.4 MG CAPS capsule Take 1 capsule (0.4 mg total) by mouth daily. (Patient taking differently: Take 0.4 mg by mouth daily after breakfast.)    Past Medical History:  Diagnosis Date  . Autism   . Chronic kidney disease   . Hypertension   . Murmur   . OCD (obsessive compulsive disorder)     Past Surgical History:  Procedure Laterality Date  . A/V FISTULAGRAM Left 10/07/2020   Procedure: A/V FISTULAGRAM;  Surgeon:  Katha Cabal, MD;  Location: [place] [facility];  Service: Cardiovascular;  Laterality: Left;  . AV FISTULA PLACEMENT Left 07/15/2020   Procedure: ARTERIOVENOUS FISTULA CREATION;  Surgeon: Katha Cabal, MD;  Location: [facility];  Service: Vascular;  Laterality: Left;  . DIALYSIS/PERMA CATHETER INSERTION N/A 04/06/2020   Procedure: DIALYSIS/PERMA CATHETER INSERTION;  Surgeon: Algernon Huxley, MD;  Location: [place] [facility];  Service: Cardiovascular;  Laterality: N/A;  . DIALYSIS/PERMA CATHETER INSERTION N/A 05/26/2020   Procedure: DIALYSIS/PERMA CATHETER INSERTION;  Surgeon: Katha Cabal, MD;  Location: [place] [facility];  Service: Cardiovascular;  Laterality: N/A;  . DIALYSIS/PERMA CATHETER INSERTION N/A 07/09/2020   Procedure: DIALYSIS/PERMA CATHETER INSERTION;  Surgeon: Algernon Huxley, MD;  Location: [place] [facility];  Service: Cardiovascular;  Laterality: N/A;    Social History Social History   Tobacco Use  . Smoking status: Never Smoker  . Smokeless tobacco: Never Used  Vaping Use  . Vaping Use: Never used  Substance Use Topics  . Alcohol use: Never  . Drug use: Never    Family History No family history on file.  Allergies  Allergen Reactions  . Chlorhexidine Other (See Comments)    Blisters (topical)     REVIEW OF SYSTEMS (Negative unless checked)  Constitutional: '[]'$ Weight loss  '[]'$ Fever  '[]'$ Chills Cardiac: '[]'$ Chest pain   '[]'$ Chest pressure   '[]'$ Palpitations   '[]'$ Shortness of breath when laying flat   '[]'$ Shortness of breath with exertion. Vascular:  '[]'$ Pain in legs with walking   '[]'$   Pain in legs at rest  '[]'$ History of DVT   '[]'$ Phlebitis   '[]'$ Swelling in legs   '[]'$ Varicose veins   '[]'$ Non-healing ulcers Pulmonary:   '[]'$ Uses home oxygen   '[]'$ Productive cough   '[]'$ Hemoptysis   '[]'$ Wheeze  '[]'$ COPD   '[]'$ Asthma Neurologic:  '[]'$ Dizziness   '[]'$ Seizures   '[]'$ History of stroke   '[]'$ History of TIA  '[]'$ Aphasia   '[]'$ Vissual changes   '[]'$ Weakness or numbness in arm   '[]'$ Weakness or numbness  in leg Musculoskeletal:   '[]'$ Joint swelling   '[]'$ Joint pain   '[]'$ Low back pain Hematologic:  '[]'$ Easy bruising  '[]'$ Easy bleeding   '[]'$ Hypercoagulable state   '[]'$ Anemic Gastrointestinal:  '[]'$ Diarrhea   '[]'$ Vomiting  '[]'$ Gastroesophageal reflux/heartburn   '[]'$ Difficulty swallowing. Genitourinary:  '[x]'$ Chronic kidney disease   '[]'$ Difficult urination  '[]'$ Frequent urination   '[]'$ Blood in urine Skin:  '[]'$ Rashes   '[]'$ Ulcers  Psychological:  '[]'$ History of anxiety   '[]'$  History of major depression.  Physical Examination  Vitals:   12/11/20 0835  BP: 130/61  Pulse: 88  Resp: 18  Temp: 97.6 F (36.4 C)  TempSrc: Temporal  SpO2: 100%   There is no height or weight on file to calculate BMI. Gen: WD/WN, NAD Head: [place]/AT, No temporalis wasting.  Ear/Nose/Throat: Hearing grossly intact, nares w/o erythema or drainage Eyes: PER, EOMI, sclera nonicteric.  Neck: Supple, no large masses.   Pulmonary:  Good air movement, no audible wheezing bilaterally, no use of accessory muscles.  Cardiac: RRR, no JVD Vascular: Left brachiocephalic fistula with a good thrill good bruit Vessel Right Left  Radial Palpable Palpable  Brachial Palpable Palpable  Gastrointestinal: Non-distended. No guarding/no peritoneal signs.  Musculoskeletal: M/S 5/5 throughout.  No deformity or atrophy.  Neurologic: CN 2-12 intact. Symmetrical.  Speech is fluent. Motor exam as listed above. Psychiatric: Judgment intact, Mood & affect appropriate for pt's clinical situation. Dermatologic: No rashes or ulcers noted.  No changes consistent with cellulitis.   CBC Lab Results  Component Value Date   WBC 7.9 12/09/2020   HGB 11.2 (L) 12/11/2020   HCT 33.0 (L) 12/11/2020   MCV 93.6 12/09/2020   PLT 179 12/09/2020    BMET    Component Value Date/Time   NA 140 12/11/2020 0920   K 4.0 12/11/2020 0920   CL 97 (L) 12/11/2020 0920   CO2 30 12/09/2020 1012   GLUCOSE 85 12/11/2020 0920   BUN 40 (H) 12/11/2020 0920   CREATININE 12.10 (H) 12/11/2020  0920   CALCIUM 8.9 12/09/2020 1012   GFRNONAA 4 (L) 12/09/2020 1012   GFRAA 8 (L) 04/24/2020 1219   Estimated Creatinine Clearance: 8.6 mL/min (A) (by C-G formula based on SCr of 12.1 mg/dL (H)).  COAG Lab Results  Component Value Date   INR 1.1 12/09/2020   INR 1.1 07/13/2020   INR 1.2 04/06/2020    Radiology No results found.   Assessment/Plan 1. Complication of vascular access for dialysis, sequela Recommend:  At this time the patient does not have appropriate extremity access for dialysis  Patient should have a revision of the left arm fistula in surgery, the stricture will need to be removed and the retrograde tributary removed.  The risks, benefits and alternative therapies were reviewed in detail with the patient's father (his guardian).  All questions were answered.  The patient agrees to proceed with surgery.     2. End stage renal disease ([place]) At the present time the patient has adequate dialysis access.  Continue hemodialysis as ordered without interruption.  Avoid nephrotoxic  medications and dehydration.  Further plans per nephrology  3. Essential hypertension Continue antihypertensive medications as already ordered, these medications have been reviewed and there are no changes at this time.  [name], MD  12/11/2020 10:03 AM

## 2020-12-11 NOTE — Anesthesia Procedure Notes (Signed)
Procedure Name: LMA Insertion Date/Time: 12/11/2020 10:20 AM Performed by: Gayland Curry, CRNA Pre-anesthesia Checklist: Patient identified, Emergency Drugs available, Suction available and Patient being monitored Patient Re-evaluated:Patient Re-evaluated prior to induction Oxygen Delivery Method: Circle system utilized Preoxygenation: Pre-oxygenation with 100% oxygen Induction Type: IV induction LMA: LMA inserted LMA Size: 5.0 Number of attempts: 1 Placement Confirmation: positive ETCO2 and breath sounds checked- equal and bilateral Tube secured with: Tape Dental Injury: Teeth and Oropharynx as per pre-operative assessment

## 2020-12-14 ENCOUNTER — Encounter: Payer: Self-pay | Admitting: Vascular Surgery

## 2020-12-16 NOTE — Op Note (Signed)
OPERATIVE NOTE   PROCEDURE: Revision left brachial cephalic arteriovenous fistula placement  PRE-OPERATIVE DIAGNOSIS: End Stage Renal Disease  POST-OPERATIVE DIAGNOSIS: End Stage Renal Disease  SURGEON: [name]  ASSISTANT(S): None  ANESTHESIA: general  ESTIMATED BLOOD LOSS: <50 cc  FINDING(S): Temporary greater than 80% stenosis located at the anastomosis as well as the midportion of the fistula large tributary with retrograde flow down to his forearm  SPECIMEN(S):  none  INDICATIONS:   Joseph Gilmore is a 50 y.o. male who presents with end stage renal disease.  The patient is scheduled for left brachiocephalic arteriovenous fistula placement.  The patient is aware the risks include but are not limited to: bleeding, infection, steal syndrome, nerve damage, ischemic monomelic neuropathy, failure to mature, and need for additional procedures.  The patient is aware of the risks of the procedure and elects to proceed forward.  DESCRIPTION: After full informed written consent was obtained from the patient, the patient was brought back to the operating room and placed supine upon the operating table.  Prior to induction, the patient received IV antibiotics.   After obtaining adequate anesthesia, the patient was then prepped and draped in the standard fashion for a revision of the existing left arm access.    A curvilinear incision was then created over the existing fistula. The cephalic vein was then identified and dissected circumferentially, identifying the original anastomosis and dissected out the brachial artery proximally and distally to the anastomosis to allow for control of the artery and the inflow. It was marked with a surgical marker.  This also expose the confluence of the large tributary that is coursing retrograde down the forearm siphoning flow from the fistula itself and increasing venous hypertension throughout the arm.  This tributary was then dissected for  several centimeters until a separate incision was needed overlying the tributaries of the approximately 8 to 10 cm of pain to be harvested.  The vein was then ligated at the confluence as well as distally and opened longitudinally to create a patch.  It was then placed in heparinized saline on the back table.  The Silastic Vesseloops on the brachial artery was then used to control arterial flow bulldog was placed along the cephalic vein above the stricture stenosis and the vein was then opened with 11 blade and extended with Potts scissors.  The venotomy was extended actually into the brachial artery for approximately 4 mm to allow for widening of the actual anastomosis.  The patch was then cut to length and applied using running 6-0 Prolene.  Flow was then reestablished to the fistula and the suture line checked for leaks.  Any leaks were controlled with 6-0 Prolene interrupted suture.  Attention was then turned to the stricture that was located more proximally on the arm.  The stricture was resected and an end-to-end vein to vein anastomosis using interrupted 6-0 Prolene.  Flow was then reestablished to the fistula and an excellent thrill was noted.  At this point, I irrigated out the surgical wound.  There was no further active bleeding.  The subcutaneous tissue was reapproximated with a running stitch of 3-0 Vicryl.  The skin was then reapproximated with a running subcuticular stitch of 4-0 Vicryl.  The skin was then cleaned, dried, and reinforced with Dermabond.    The patient tolerated this procedure well.   COMPLICATIONS: None  CONDITION: Joseph Gilmore Joseph Gilmore  Office: [phone]   12/16/2020, 7:31 AM

## 2020-12-23 ENCOUNTER — Telehealth (INDEPENDENT_AMBULATORY_CARE_PROVIDER_SITE_OTHER): Payer: Self-pay

## 2020-12-23 DIAGNOSIS — Z992 Dependence on renal dialysis: Secondary | ICD-10-CM | POA: Insufficient documentation

## 2020-12-23 DIAGNOSIS — N186 End stage renal disease: Secondary | ICD-10-CM | POA: Insufficient documentation

## 2020-12-23 DIAGNOSIS — Z01818 Encounter for other preprocedural examination: Secondary | ICD-10-CM | POA: Insufficient documentation

## 2020-12-23 NOTE — Telephone Encounter (Signed)
Patient's father called stating that at the dialysis center the doctor had the bandage removed from the surgical site. Patient had surgery on 12/11/20. Per the patient's discharge summary the bandage could be removed as long as no drainage was present. I advised the patient's father of this and he was adamant that he was told 3 weeks, I explained that the patient is to be seen in 3 weeks not to keep the dressing on for 3 weeks. I asked that he look at the discharge summary given to them  and it will give him the directions regarding the access as well.

## 2020-12-24 ENCOUNTER — Telehealth (INDEPENDENT_AMBULATORY_CARE_PROVIDER_SITE_OTHER): Payer: Self-pay

## 2020-12-24 NOTE — Telephone Encounter (Signed)
A fax and call was received from [facility] regarding the patient needing a cath exchange as his cath is not working properly. Misty at [facility] was called and told to have the patient go to the ED for evaluation as I am unable to schedule him for tomorrow.

## 2021-01-07 ENCOUNTER — Telehealth (INDEPENDENT_AMBULATORY_CARE_PROVIDER_SITE_OTHER): Payer: Self-pay | Admitting: Vascular Surgery

## 2021-01-07 ENCOUNTER — Other Ambulatory Visit (INDEPENDENT_AMBULATORY_CARE_PROVIDER_SITE_OTHER): Payer: Self-pay | Admitting: Nurse Practitioner

## 2021-01-07 NOTE — Telephone Encounter (Signed)
Patient scheduled with Dr. Lucky Cowboy for a permcath exchange on 01/08/21 with a 11:30 am arrival time to the [facility]. Pre-procedure instructions were discussed.

## 2021-01-07 NOTE — Telephone Encounter (Signed)
Please advise 

## 2021-01-07 NOTE — Telephone Encounter (Signed)
[facility] call on behalf of patient stating he needs cath exchange, patient is not able to do dialysis.  Please advise.

## 2021-01-08 ENCOUNTER — Ambulatory Visit
Admission: RE | Admit: 2021-01-08 | Discharge: 2021-01-08 | Disposition: A | Discharge status: Home / Self Care | Payer: Medicare HMO | Attending: Vascular Surgery | Admitting: Vascular Surgery

## 2021-01-08 ENCOUNTER — Other Ambulatory Visit: Payer: Self-pay

## 2021-01-08 ENCOUNTER — Encounter: Payer: Self-pay | Admitting: Vascular Surgery

## 2021-01-08 ENCOUNTER — Encounter
Admission: RE | Disposition: A | Discharge status: Home / Self Care | Payer: Self-pay | Source: Home / Self Care | Attending: Vascular Surgery

## 2021-01-08 DIAGNOSIS — Z992 Dependence on renal dialysis: Secondary | ICD-10-CM | POA: Diagnosis not present

## 2021-01-08 DIAGNOSIS — I120 Hypertensive chronic kidney disease with stage 5 chronic kidney disease or end stage renal disease: Secondary | ICD-10-CM | POA: Diagnosis not present

## 2021-01-08 DIAGNOSIS — N186 End stage renal disease: Secondary | ICD-10-CM | POA: Insufficient documentation

## 2021-01-08 DIAGNOSIS — Z4901 Encounter for fitting and adjustment of extracorporeal dialysis catheter: Secondary | ICD-10-CM | POA: Insufficient documentation

## 2021-01-08 DIAGNOSIS — Z79899 Other long term (current) drug therapy: Secondary | ICD-10-CM | POA: Insufficient documentation

## 2021-01-08 DIAGNOSIS — T82898A Other specified complication of vascular prosthetic devices, implants and grafts, initial encounter: Secondary | ICD-10-CM | POA: Diagnosis not present

## 2021-01-08 HISTORY — PX: DIALYSIS/PERMA CATHETER INSERTION: CATH118288

## 2021-01-08 LAB — POTASSIUM (ARMC VASCULAR LAB ONLY): Potassium (ARMC vascular lab): 4.8 (ref 3.5–5.1)

## 2021-01-08 SURGERY — DIALYSIS/PERMA CATHETER INSERTION
Anesthesia: Moderate Sedation

## 2021-01-08 MED ORDER — SODIUM CHLORIDE 0.9 % IV SOLN
INTRAVENOUS | Status: AC
  Administered 2021-01-08: 1 g via INTRAVENOUS
  Filled 2021-01-08: qty 10

## 2021-01-08 MED ORDER — HEPARIN SODIUM (PORCINE) 1000 UNIT/ML IJ SOLN
INTRAMUSCULAR | Status: AC
  Filled 2021-01-08: qty 1

## 2021-01-08 MED ORDER — METHYLPREDNISOLONE SODIUM SUCC 125 MG IJ SOLR: 125.0000 mg | Freq: Once | INTRAMUSCULAR | Status: DC | PRN

## 2021-01-08 MED ORDER — FENTANYL CITRATE (PF) 100 MCG/2ML IJ SOLN
INTRAMUSCULAR | Status: DC | PRN
  Administered 2021-01-08: 50 ug via INTRAVENOUS

## 2021-01-08 MED ORDER — MIDAZOLAM HCL 5 MG/5ML IJ SOLN
INTRAMUSCULAR | Status: AC
  Filled 2021-01-08: qty 5

## 2021-01-08 MED ORDER — FENTANYL CITRATE (PF) 100 MCG/2ML IJ SOLN
INTRAMUSCULAR | Status: AC
  Filled 2021-01-08: qty 2

## 2021-01-08 MED ORDER — FAMOTIDINE 20 MG PO TABS: 40.0000 mg | ORAL_TABLET | Freq: Once | ORAL | Status: DC | PRN

## 2021-01-08 MED ORDER — SODIUM CHLORIDE 0.9 % IV SOLN: 1.0000 g | Freq: Once | INTRAVENOUS | Status: AC

## 2021-01-08 MED ORDER — ONDANSETRON HCL 4 MG/2ML IJ SOLN: 4.0000 mg | Freq: Four times a day (QID) | INTRAMUSCULAR | Status: DC | PRN

## 2021-01-08 MED ORDER — DIPHENHYDRAMINE HCL 50 MG/ML IJ SOLN: 50.0000 mg | Freq: Once | INTRAMUSCULAR | Status: DC | PRN

## 2021-01-08 MED ORDER — MIDAZOLAM HCL 2 MG/2ML IJ SOLN
INTRAMUSCULAR | Status: DC | PRN
  Administered 2021-01-08: 1 mg via INTRAVENOUS

## 2021-01-08 MED ORDER — HEPARIN SODIUM (PORCINE) 10000 UNIT/ML IJ SOLN
INTRAMUSCULAR | Status: AC
  Filled 2021-01-08: qty 1

## 2021-01-08 MED ORDER — HYDROMORPHONE HCL 1 MG/ML IJ SOLN: 1.0000 mg | Freq: Once | INTRAMUSCULAR | Status: DC | PRN

## 2021-01-08 MED ORDER — SODIUM CHLORIDE 0.9 % IV SOLN: INTRAVENOUS | Status: DC

## 2021-01-08 MED ORDER — MIDAZOLAM HCL 2 MG/ML PO SYRP: 8.0000 mg | ORAL_SOLUTION | Freq: Once | ORAL | Status: DC | PRN

## 2021-01-08 SURGICAL SUPPLY — 6 items
CATH PALIN MAXID VT KIT 19CM (CATHETERS) ×1 IMPLANT
DURAPREP 26ML APPLICATOR (WOUND CARE) ×1 IMPLANT
GUIDEWIRE SUPER STIFF .035X180 (WIRE) ×1 IMPLANT
PACK ANGIOGRAPHY (CUSTOM PROCEDURE TRAY) ×1 IMPLANT
SUT MNCRL AB 4-0 PS2 18 (SUTURE) ×1 IMPLANT
SUT PROLENE 0 CT 1 30 (SUTURE) ×1 IMPLANT

## 2021-01-08 NOTE — Interval H&P Note (Signed)
History and Physical Interval Note:  01/08/2021 12:11 PM  Joseph Gilmore  has presented today for surgery, with the diagnosis of Perma Cath Exchange   End Stage Renal.  The various methods of treatment have been discussed with the patient and family. After consideration of risks, benefits and other options for treatment, the patient has consented to  Procedure(s): DIALYSIS/PERMA CATHETER INSERTION (N/A) as a surgical intervention.  The patient's history has been reviewed, patient examined, no change in status, stable for surgery.  I have reviewed the patient's chart and labs.  Questions were answered to the patient's satisfaction.     [name]

## 2021-01-08 NOTE — Op Note (Signed)
OPERATIVE NOTE    PRE-OPERATIVE DIAGNOSIS: 1. ESRD 2. Non-functional permcath  POST-OPERATIVE DIAGNOSIS: same as above  PROCEDURE: 1. Fluoroscopic guidance for placement of catheter 2. Placement of a 19 cm tip to cuff tunneled hemodialysis catheter via the right internal jugular vein and removal of previous catheter  SURGEON: [name], MD  ANESTHESIA:  Local with moderate conscious sedation for 14 minutes using 1 mg of Versed and 50 mcg of Fentanyl  ESTIMATED BLOOD LOSS: 5 cc  FINDING(S): none  SPECIMEN(S):  None  INDICATIONS:   Patient is a 51 y.o.male who presents with non-functional dialysis catheter and ESRD.  The patient needs long term dialysis access for their ESRD, and a Permcath is necessary.  Risks and benefits are discussed and informed consent is obtained.    DESCRIPTION: After obtaining full informed written consent, the patient was brought back to the vascular suite. The patient received moderate conscious sedation during a face-to-face encounter with me present throughout the entire procedure and supervising the RN monitoring the vital signs, pulse oximetry, telemetry, and mental status throughout the entire procedure. The patient's existing catheter, right neck and chest were sterilely prepped and draped in a sterile surgical field was created.  The existing catheter was dissected free from the fibrous sheath securing the cuff with hemostats and blunt dissection.  A wire was placed. The existing catheter was then removed and the wire used to keep venous access. I selected a 19 cm tip to cuff tunneled dialysis catheter.  Using fluoroscopic guidance the catheter tips were parked in the right atrium. The appropriate distal connectors were placed. It withdrew blood well and flushed easily with heparinized saline and a concentrated heparin solution was then placed. It was secured to the chest wall with 2 Prolene sutures. A 4-0 Monocryl pursestring suture was placed around the  exit site. Sterile dressings were placed. The patient tolerated the procedure well and was taken to the recovery room in stable condition.  COMPLICATIONS: None  CONDITION: Stable  [name] 01/08/2021 1:15 PM   This note was created with Dragon Medical transcription system. Any errors in dictation are purely unintentional.

## 2021-01-15 ENCOUNTER — Other Ambulatory Visit (INDEPENDENT_AMBULATORY_CARE_PROVIDER_SITE_OTHER): Payer: Self-pay | Admitting: Vascular Surgery

## 2021-01-15 DIAGNOSIS — Z9889 Other specified postprocedural states: Secondary | ICD-10-CM

## 2021-01-15 DIAGNOSIS — N186 End stage renal disease: Secondary | ICD-10-CM

## 2021-01-18 ENCOUNTER — Ambulatory Visit (INDEPENDENT_AMBULATORY_CARE_PROVIDER_SITE_OTHER): Payer: Medicare HMO | Admitting: Vascular Surgery

## 2021-01-18 ENCOUNTER — Encounter (INDEPENDENT_AMBULATORY_CARE_PROVIDER_SITE_OTHER): Payer: Self-pay | Admitting: Vascular Surgery

## 2021-01-18 ENCOUNTER — Other Ambulatory Visit: Payer: Self-pay

## 2021-01-18 ENCOUNTER — Ambulatory Visit (INDEPENDENT_AMBULATORY_CARE_PROVIDER_SITE_OTHER): Payer: Medicare HMO

## 2021-01-18 VITALS — BP 116/81 | HR 80 | Ht 68.0 in | Wt 237.0 lb

## 2021-01-18 DIAGNOSIS — Z992 Dependence on renal dialysis: Secondary | ICD-10-CM

## 2021-01-18 DIAGNOSIS — N186 End stage renal disease: Secondary | ICD-10-CM

## 2021-01-18 DIAGNOSIS — Z9889 Other specified postprocedural states: Secondary | ICD-10-CM

## 2021-01-18 DIAGNOSIS — I10 Essential (primary) hypertension: Secondary | ICD-10-CM

## 2021-01-18 DIAGNOSIS — T829XXS Unspecified complication of cardiac and vascular prosthetic device, implant and graft, sequela: Secondary | ICD-10-CM

## 2021-01-18 NOTE — Progress Notes (Signed)
Patient ID: Joseph Gilmore, male   DOB: 1971/01/03, 50 y.o.   MRN: [id number]  Chief Complaint  Patient presents with  . Follow-up    [facility] 3 wk F/U fistula Creation     HPI MARQUIES MIMMS Gilmore is a 50 y.o. male.    No pain reported at the site   Past Medical History:  Diagnosis Date  . Autism   . Chronic kidney disease   . Hypertension   . Murmur   . OCD (obsessive compulsive disorder)     Past Surgical History:  Procedure Laterality Date  . A/V FISTULAGRAM Left 10/07/2020   Procedure: A/V FISTULAGRAM;  Surgeon: Katha Cabal, MD;  Location: [place] [facility];  Service: Cardiovascular;  Laterality: Left;  . AV FISTULA PLACEMENT Left 07/15/2020   Procedure: ARTERIOVENOUS FISTULA CREATION;  Surgeon: Katha Cabal, MD;  Location: [facility];  Service: Vascular;  Laterality: Left;  . DIALYSIS/PERMA CATHETER INSERTION N/A 04/06/2020   Procedure: DIALYSIS/PERMA CATHETER INSERTION;  Surgeon: Algernon Huxley, MD;  Location: [place] [facility];  Service: Cardiovascular;  Laterality: N/A;  . DIALYSIS/PERMA CATHETER INSERTION N/A 05/26/2020   Procedure: DIALYSIS/PERMA CATHETER INSERTION;  Surgeon: Katha Cabal, MD;  Location: [place] [facility];  Service: Cardiovascular;  Laterality: N/A;  . DIALYSIS/PERMA CATHETER INSERTION N/A 07/09/2020   Procedure: DIALYSIS/PERMA CATHETER INSERTION;  Surgeon: Algernon Huxley, MD;  Location: [place] [facility];  Service: Cardiovascular;  Laterality: N/A;  . DIALYSIS/PERMA CATHETER INSERTION N/A 01/08/2021   Procedure: DIALYSIS/PERMA CATHETER INSERTION;  Surgeon: Algernon Huxley, MD;  Location: [place] [facility];  Service: Cardiovascular;  Laterality: N/A;  . REVISON OF ARTERIOVENOUS FISTULA Left 12/11/2020   Procedure: ARTERIOVENOUS (AV) FISTULA CREATION ( BRACHIAL CEPHALIC );  Surgeon: Katha Cabal, MD;  Location: [facility];  Service: Vascular;  Laterality: Left;      Allergies  Allergen Reactions  . Chlorhexidine Other (See  Comments)    Blisters (topical)    Current Outpatient Medications  Medication Sig Dispense Refill  . B Complex-C-Folic Acid (RENA-VITE PO) Take 1 tablet by mouth daily.    . calcium carbonate (TUMS - DOSED IN MG ELEMENTAL CALCIUM) 500 MG chewable tablet Chew 2 tablets by mouth daily.    . Cholecalciferol (VITAMIN D-3) 125 MCG (5000 UT) TABS Take 5,000 Units by mouth daily.    Marland Kitchen dutasteride (AVODART) 0.5 MG capsule Take 1 capsule (0.5 mg total) by mouth daily. 30 capsule 11  . HYDROcodone-acetaminophen (NORCO) 5-325 MG tablet Take 1-2 tablets by mouth every 6 (six) hours as needed for moderate pain or severe pain. (Patient not taking: No sig reported) 30 tablet 0  . losartan (COZAAR) 100 MG tablet Take 50 mg by mouth daily. (Patient not taking: No sig reported)    . tamsulosin (FLOMAX) 0.4 MG CAPS capsule Take 1 capsule (0.4 mg total) by mouth daily. (Patient not taking: Reported on 01/18/2021) 30 capsule 11   No current facility-administered medications for this visit.        Physical Exam BP 116/81   Pulse 80   Ht '5\' 8"'$  (1.727 m)   Wt 237 lb (107.5 kg)   BMI 36.04 kg/m  Gen:  WD/WN, NAD Skin: incision C/D/I;  Good thrill and good bruit     Assessment/Plan: 1. Complication of vascular access for dialysis, sequela Recommend:  The patient is doing well and currently has adequate dialysis access.  OK to begin cannulation, note furnished for dialysis  center.  The patient's dialysis center is not reporting any access issues. Flow pattern is stable when compared to the prior ultrasound.  The patient should have a duplex ultrasound of the dialysis access in 6 months.  The patient will follow-up with me in the office after each ultrasound    - VAS [place] (AVF, AVG); Future  2. ESRD on hemodialysis ([place]) At the present time the patient has adequate dialysis access.  Continue hemodialysis as ordered without interruption.  Avoid nephrotoxic medications  and dehydration.  Further plans per nephrology  3. Essential hypertension Continue antihypertensive medications as already ordered, these medications have been reviewed and there are no changes at this time.        [name] 01/18/2021, 1:57 PM   This note was created with Dragon medical transcription system.  Any errors from dictation are unintentional.

## 2021-01-19 ENCOUNTER — Encounter (INDEPENDENT_AMBULATORY_CARE_PROVIDER_SITE_OTHER): Payer: Self-pay | Admitting: Vascular Surgery

## 2021-01-28 DIAGNOSIS — Z992 Dependence on renal dialysis: Secondary | ICD-10-CM | POA: Diagnosis not present

## 2021-01-28 DIAGNOSIS — N2581 Secondary hyperparathyroidism of renal origin: Secondary | ICD-10-CM | POA: Diagnosis not present

## 2021-01-28 DIAGNOSIS — N186 End stage renal disease: Secondary | ICD-10-CM | POA: Diagnosis not present

## 2021-01-30 DIAGNOSIS — N186 End stage renal disease: Secondary | ICD-10-CM | POA: Diagnosis not present

## 2021-01-30 DIAGNOSIS — Z992 Dependence on renal dialysis: Secondary | ICD-10-CM | POA: Diagnosis not present

## 2021-01-30 DIAGNOSIS — N2581 Secondary hyperparathyroidism of renal origin: Secondary | ICD-10-CM | POA: Diagnosis not present

## 2021-02-02 DIAGNOSIS — N2581 Secondary hyperparathyroidism of renal origin: Secondary | ICD-10-CM | POA: Diagnosis not present

## 2021-02-02 DIAGNOSIS — N186 End stage renal disease: Secondary | ICD-10-CM | POA: Diagnosis not present

## 2021-02-02 DIAGNOSIS — Z992 Dependence on renal dialysis: Secondary | ICD-10-CM | POA: Diagnosis not present

## 2021-02-03 DIAGNOSIS — N400 Enlarged prostate without lower urinary tract symptoms: Secondary | ICD-10-CM | POA: Diagnosis not present

## 2021-02-03 DIAGNOSIS — R030 Elevated blood-pressure reading, without diagnosis of hypertension: Secondary | ICD-10-CM | POA: Diagnosis not present

## 2021-02-03 DIAGNOSIS — N186 End stage renal disease: Secondary | ICD-10-CM | POA: Diagnosis not present

## 2021-02-03 DIAGNOSIS — Z888 Allergy status to other drugs, medicaments and biological substances status: Secondary | ICD-10-CM | POA: Diagnosis not present

## 2021-02-03 DIAGNOSIS — E669 Obesity, unspecified: Secondary | ICD-10-CM | POA: Diagnosis not present

## 2021-02-03 DIAGNOSIS — Z992 Dependence on renal dialysis: Secondary | ICD-10-CM | POA: Diagnosis not present

## 2021-02-03 DIAGNOSIS — Z6836 Body mass index (BMI) 36.0-36.9, adult: Secondary | ICD-10-CM | POA: Diagnosis not present

## 2021-02-04 DIAGNOSIS — N2581 Secondary hyperparathyroidism of renal origin: Secondary | ICD-10-CM | POA: Diagnosis not present

## 2021-02-04 DIAGNOSIS — N186 End stage renal disease: Secondary | ICD-10-CM | POA: Diagnosis not present

## 2021-02-04 DIAGNOSIS — Z992 Dependence on renal dialysis: Secondary | ICD-10-CM | POA: Diagnosis not present

## 2021-02-06 DIAGNOSIS — Z992 Dependence on renal dialysis: Secondary | ICD-10-CM | POA: Diagnosis not present

## 2021-02-06 DIAGNOSIS — N186 End stage renal disease: Secondary | ICD-10-CM | POA: Diagnosis not present

## 2021-02-06 DIAGNOSIS — N2581 Secondary hyperparathyroidism of renal origin: Secondary | ICD-10-CM | POA: Diagnosis not present

## 2021-02-09 DIAGNOSIS — Z992 Dependence on renal dialysis: Secondary | ICD-10-CM | POA: Diagnosis not present

## 2021-02-09 DIAGNOSIS — N186 End stage renal disease: Secondary | ICD-10-CM | POA: Diagnosis not present

## 2021-02-09 DIAGNOSIS — N2581 Secondary hyperparathyroidism of renal origin: Secondary | ICD-10-CM | POA: Diagnosis not present

## 2021-02-11 DIAGNOSIS — N2581 Secondary hyperparathyroidism of renal origin: Secondary | ICD-10-CM | POA: Diagnosis not present

## 2021-02-11 DIAGNOSIS — Z992 Dependence on renal dialysis: Secondary | ICD-10-CM | POA: Diagnosis not present

## 2021-02-11 DIAGNOSIS — N186 End stage renal disease: Secondary | ICD-10-CM | POA: Diagnosis not present

## 2021-02-13 DIAGNOSIS — N2581 Secondary hyperparathyroidism of renal origin: Secondary | ICD-10-CM | POA: Diagnosis not present

## 2021-02-13 DIAGNOSIS — N186 End stage renal disease: Secondary | ICD-10-CM | POA: Diagnosis not present

## 2021-02-13 DIAGNOSIS — Z992 Dependence on renal dialysis: Secondary | ICD-10-CM | POA: Diagnosis not present

## 2021-02-16 DIAGNOSIS — Z992 Dependence on renal dialysis: Secondary | ICD-10-CM | POA: Diagnosis not present

## 2021-02-16 DIAGNOSIS — N186 End stage renal disease: Secondary | ICD-10-CM | POA: Diagnosis not present

## 2021-02-16 DIAGNOSIS — N2581 Secondary hyperparathyroidism of renal origin: Secondary | ICD-10-CM | POA: Diagnosis not present

## 2021-02-18 DIAGNOSIS — N2581 Secondary hyperparathyroidism of renal origin: Secondary | ICD-10-CM | POA: Diagnosis not present

## 2021-02-18 DIAGNOSIS — Z992 Dependence on renal dialysis: Secondary | ICD-10-CM | POA: Diagnosis not present

## 2021-02-18 DIAGNOSIS — N186 End stage renal disease: Secondary | ICD-10-CM | POA: Diagnosis not present

## 2021-02-20 DIAGNOSIS — N186 End stage renal disease: Secondary | ICD-10-CM | POA: Diagnosis not present

## 2021-02-20 DIAGNOSIS — Z992 Dependence on renal dialysis: Secondary | ICD-10-CM | POA: Diagnosis not present

## 2021-02-20 DIAGNOSIS — N2581 Secondary hyperparathyroidism of renal origin: Secondary | ICD-10-CM | POA: Diagnosis not present

## 2021-02-23 DIAGNOSIS — N186 End stage renal disease: Secondary | ICD-10-CM | POA: Diagnosis not present

## 2021-02-23 DIAGNOSIS — Z992 Dependence on renal dialysis: Secondary | ICD-10-CM | POA: Diagnosis not present

## 2021-02-23 DIAGNOSIS — N2581 Secondary hyperparathyroidism of renal origin: Secondary | ICD-10-CM | POA: Diagnosis not present

## 2021-02-25 DIAGNOSIS — N186 End stage renal disease: Secondary | ICD-10-CM | POA: Diagnosis not present

## 2021-02-25 DIAGNOSIS — N2581 Secondary hyperparathyroidism of renal origin: Secondary | ICD-10-CM | POA: Diagnosis not present

## 2021-02-25 DIAGNOSIS — Z992 Dependence on renal dialysis: Secondary | ICD-10-CM | POA: Diagnosis not present

## 2021-02-27 DIAGNOSIS — N186 End stage renal disease: Secondary | ICD-10-CM | POA: Diagnosis not present

## 2021-02-27 DIAGNOSIS — N2581 Secondary hyperparathyroidism of renal origin: Secondary | ICD-10-CM | POA: Diagnosis not present

## 2021-02-27 DIAGNOSIS — Z992 Dependence on renal dialysis: Secondary | ICD-10-CM | POA: Diagnosis not present

## 2021-03-02 DIAGNOSIS — Z992 Dependence on renal dialysis: Secondary | ICD-10-CM | POA: Diagnosis not present

## 2021-03-02 DIAGNOSIS — N186 End stage renal disease: Secondary | ICD-10-CM | POA: Diagnosis not present

## 2021-03-02 DIAGNOSIS — N2581 Secondary hyperparathyroidism of renal origin: Secondary | ICD-10-CM | POA: Diagnosis not present

## 2021-03-04 DIAGNOSIS — Z992 Dependence on renal dialysis: Secondary | ICD-10-CM | POA: Diagnosis not present

## 2021-03-04 DIAGNOSIS — N2581 Secondary hyperparathyroidism of renal origin: Secondary | ICD-10-CM | POA: Diagnosis not present

## 2021-03-04 DIAGNOSIS — N186 End stage renal disease: Secondary | ICD-10-CM | POA: Diagnosis not present

## 2021-03-06 DIAGNOSIS — N186 End stage renal disease: Secondary | ICD-10-CM | POA: Diagnosis not present

## 2021-03-06 DIAGNOSIS — N2581 Secondary hyperparathyroidism of renal origin: Secondary | ICD-10-CM | POA: Diagnosis not present

## 2021-03-06 DIAGNOSIS — Z992 Dependence on renal dialysis: Secondary | ICD-10-CM | POA: Diagnosis not present

## 2021-03-09 ENCOUNTER — Telehealth (INDEPENDENT_AMBULATORY_CARE_PROVIDER_SITE_OTHER): Payer: Self-pay

## 2021-03-09 DIAGNOSIS — N2581 Secondary hyperparathyroidism of renal origin: Secondary | ICD-10-CM | POA: Diagnosis not present

## 2021-03-09 DIAGNOSIS — Z992 Dependence on renal dialysis: Secondary | ICD-10-CM | POA: Diagnosis not present

## 2021-03-09 DIAGNOSIS — N186 End stage renal disease: Secondary | ICD-10-CM | POA: Diagnosis not present

## 2021-03-09 NOTE — Telephone Encounter (Signed)
A fax was received from [facility] for the patient to have a permcath removal. Patient was scheduled for 03/15/21 with a 10:45 am arrival time to the MM with Dr. Lucky Cowboy. Pre-procedure instructions were faxed to [facility] as requested.

## 2021-03-11 DIAGNOSIS — Z992 Dependence on renal dialysis: Secondary | ICD-10-CM | POA: Diagnosis not present

## 2021-03-11 DIAGNOSIS — N2581 Secondary hyperparathyroidism of renal origin: Secondary | ICD-10-CM | POA: Diagnosis not present

## 2021-03-11 DIAGNOSIS — N186 End stage renal disease: Secondary | ICD-10-CM | POA: Diagnosis not present

## 2021-03-13 DIAGNOSIS — Z992 Dependence on renal dialysis: Secondary | ICD-10-CM | POA: Diagnosis not present

## 2021-03-13 DIAGNOSIS — N186 End stage renal disease: Secondary | ICD-10-CM | POA: Diagnosis not present

## 2021-03-13 DIAGNOSIS — N2581 Secondary hyperparathyroidism of renal origin: Secondary | ICD-10-CM | POA: Diagnosis not present

## 2021-03-15 ENCOUNTER — Other Ambulatory Visit (INDEPENDENT_AMBULATORY_CARE_PROVIDER_SITE_OTHER): Payer: Self-pay | Admitting: Nurse Practitioner

## 2021-03-15 ENCOUNTER — Ambulatory Visit
Admission: RE | Admit: 2021-03-15 | Discharge: 2021-03-15 | Disposition: A | Discharge status: Home / Self Care | Payer: Medicare HMO | Attending: Vascular Surgery | Admitting: Vascular Surgery

## 2021-03-15 ENCOUNTER — Encounter
Admission: RE | Disposition: A | Discharge status: Home / Self Care | Payer: Self-pay | Source: Home / Self Care | Attending: Vascular Surgery

## 2021-03-15 DIAGNOSIS — Z4901 Encounter for fitting and adjustment of extracorporeal dialysis catheter: Secondary | ICD-10-CM | POA: Insufficient documentation

## 2021-03-15 DIAGNOSIS — Z452 Encounter for adjustment and management of vascular access device: Secondary | ICD-10-CM | POA: Diagnosis not present

## 2021-03-15 DIAGNOSIS — Z95828 Presence of other vascular implants and grafts: Secondary | ICD-10-CM | POA: Insufficient documentation

## 2021-03-15 DIAGNOSIS — N186 End stage renal disease: Secondary | ICD-10-CM | POA: Diagnosis not present

## 2021-03-15 DIAGNOSIS — I120 Hypertensive chronic kidney disease with stage 5 chronic kidney disease or end stage renal disease: Secondary | ICD-10-CM | POA: Diagnosis not present

## 2021-03-15 DIAGNOSIS — Z888 Allergy status to other drugs, medicaments and biological substances status: Secondary | ICD-10-CM | POA: Diagnosis not present

## 2021-03-15 DIAGNOSIS — F840 Autistic disorder: Secondary | ICD-10-CM | POA: Insufficient documentation

## 2021-03-15 HISTORY — PX: DIALYSIS/PERMA CATHETER REMOVAL: CATH118289

## 2021-03-15 SURGERY — DIALYSIS/PERMA CATHETER REMOVAL
Anesthesia: LOCAL

## 2021-03-15 NOTE — Op Note (Signed)
Operative Note     Preoperative diagnosis:   1. ESRD with functional permanent access  Postoperative diagnosis:  1. ESRD with functional permanent access  Procedure:  Removal of right jugular Permcath  Surgeon:  [name], MD  Anesthesia:  Local  EBL:  Minimal  Indication for the Procedure:  The patient has a functional permanent dialysis access and no longer needs their permcath.  This can be removed.  Risks and benefits are discussed and informed consent is obtained.  Description of the Procedure:  The patient's right neck, chest and existing catheter were sterilely prepped and draped. The area around the catheter was anesthetized copiously with 1% lidocaine. The catheter was dissected out with curved hemostats until the cuff was freed from the surrounding fibrous sheath. The fiber sheath was transected, and the catheter was then removed in its entirety using gentle traction. Pressure was held and sterile dressings were placed. The patient tolerated the procedure well and was taken to the recovery room in stable condition.     [name]  03/15/2021, 11:19 AM This note was created with Dragon Medical transcription system. Any errors in dictation are purely unintentional.

## 2021-03-15 NOTE — Discharge Instructions (Signed)
Tunneled Catheter Removal, Care After Refer to this sheet in the next few weeks. These instructions provide you with information about caring for yourself after your procedure. Your health care provider may also give you more specific instructions. Your treatment has been planned according to current medical practices, but problems sometimes occur. Call your health care provider if you have any problems or questions after your procedure. What can I expect after the procedure? After the procedure, it is common to have: Some mild redness, swelling, and pain around your catheter site.   Follow these instructions at home: Incision care  Check your removal site  every day for signs of infection. Check for: More redness, swelling, or pain. More fluid or blood. Warmth. Pus or a bad smell. Remove your dressing in 48hrs leave open to air  Activity  Return to your normal activities as told by your health care provider. Ask your health care provider what activities are safe for you. Do not lift anything that is heavier than 10 lb (4.5 kg) for 3 days  You may shower tomorrow  Contact a health care provider if: You have more fluid or blood coming from your removal site You have more redness, swelling, or pain at your incisions or around the area where your catheter was removed Your removal site feel warm to the touch. You feel unusually weak. You feel nauseous.. Get help right away if You have swelling in your arm, shoulder, neck, or face. You develop chest pain. You have difficulty breathing. You feel dizzy or light-headed. You have pus or a bad smell coming from your removal site You have a fever. You develop bleeding from your removal site, and your bleeding does not stop. This information is not intended to replace advice given to you by your health care provider. Make sure you discuss any questions you have with your health care provider. Document Released: 08/01/2012 Document Revised:  04/17/2016 Document Reviewed: 05/11/2015 Elsevier Interactive Patient Education  2017 Elsevier Inc. 

## 2021-03-15 NOTE — H&P (Signed)
[facility] Admission History & Physical  MRN : [id number]  Joseph Gilmore is a 50 y.o. ([birth year]) male who presents with chief complaint of No chief complaint on file. Marland Kitchen  History of Present Illness: I am asked to evaluate the patient by the dialysis center. The patient was sent here because they have a nonfunctioning tunneled catheter and a functioning left arm access.  The patient reports they're not been any problems with any of their dialysis runs. They are reporting good flows with good parameters at dialysis.   Patient denies pain or tenderness overlying the access.  There is no pain with dialysis.  The patient denies hand pain or finger pain consistent with steal syndrome.  No fevers or chills while on dialysis.    No current facility-administered medications for this encounter.    Past Medical History:  Diagnosis Date   Autism    Chronic kidney disease    Hypertension    Murmur    OCD (obsessive compulsive disorder)     Past Surgical History:  Procedure Laterality Date   A/V FISTULAGRAM Left 10/07/2020   Procedure: A/V FISTULAGRAM;  Surgeon: Katha Cabal, MD;  Location: [place] [facility];  Service: Cardiovascular;  Laterality: Left;   AV FISTULA PLACEMENT Left 07/15/2020   Procedure: ARTERIOVENOUS FISTULA CREATION;  Surgeon: Katha Cabal, MD;  Location: [facility];  Service: Vascular;  Laterality: Left;   DIALYSIS/PERMA CATHETER INSERTION N/A 04/06/2020   Procedure: DIALYSIS/PERMA CATHETER INSERTION;  Surgeon: Algernon Huxley, MD;  Location: [place] [facility];  Service: Cardiovascular;  Laterality: N/A;   DIALYSIS/PERMA CATHETER INSERTION N/A 05/26/2020   Procedure: DIALYSIS/PERMA CATHETER INSERTION;  Surgeon: Katha Cabal, MD;  Location: [place] [facility];  Service: Cardiovascular;  Laterality: N/A;   DIALYSIS/PERMA CATHETER INSERTION N/A 07/09/2020   Procedure: DIALYSIS/PERMA CATHETER INSERTION;  Surgeon: Algernon Huxley, MD;   Location: [place] [facility];  Service: Cardiovascular;  Laterality: N/A;   DIALYSIS/PERMA CATHETER INSERTION N/A 01/08/2021   Procedure: DIALYSIS/PERMA CATHETER INSERTION;  Surgeon: Algernon Huxley, MD;  Location: [place] [facility];  Service: Cardiovascular;  Laterality: N/A;   REVISON OF ARTERIOVENOUS FISTULA Left 12/11/2020   Procedure: ARTERIOVENOUS (AV) FISTULA CREATION ( BRACHIAL CEPHALIC );  Surgeon: Katha Cabal, MD;  Location: [facility];  Service: Vascular;  Laterality: Left;     Social History   Tobacco Use   Smoking status: Never   Smokeless tobacco: Never  Vaping Use   Vaping Use: Never used  Substance Use Topics   Alcohol use: Never   Drug use: Never    Family History No family history of bleeding or clotting disorders, autoimmune disease or porphyria  Allergies  Allergen Reactions   Chlorhexidine Other (See Comments)    Blisters (topical)     REVIEW OF SYSTEMS (Negative unless checked)  Constitutional: '[]'$ Weight loss  '[]'$ Fever  '[]'$ Chills Cardiac: '[]'$ Chest pain   '[]'$ Chest pressure   '[]'$ Palpitations   '[]'$ Shortness of breath when laying flat   '[]'$ Shortness of breath at rest   '[x]'$ Shortness of breath with exertion. Vascular:  '[]'$ Pain in legs with walking   '[]'$ Pain in legs at rest   '[]'$ Pain in legs when laying flat   '[]'$ Claudication   '[]'$ Pain in feet when walking  '[]'$ Pain in feet at rest  '[]'$ Pain in feet when laying flat   '[]'$ History of DVT   '[]'$ Phlebitis   '[]'$ Swelling in legs   '[]'$ Varicose veins   '[]'$ Non-healing ulcers Pulmonary:   '[]'$   Uses home oxygen   '[]'$ Productive cough   '[]'$ Hemoptysis   '[]'$ Wheeze  '[]'$ COPD   '[]'$ Asthma Neurologic:  '[]'$ Dizziness  '[]'$ Blackouts   '[]'$ Seizures   '[]'$ History of stroke   '[]'$ History of TIA  '[]'$ Aphasia   '[]'$ Temporary blindness   '[]'$ Dysphagia   '[]'$ Weakness or numbness in arms   '[]'$ Weakness or numbness in legs x positive for autism Musculoskeletal:  '[]'$ Arthritis   '[]'$ Joint swelling   '[]'$ Joint pain   '[]'$ Low back pain Hematologic:  '[]'$ Easy bruising  '[]'$ Easy bleeding    '[]'$ Hypercoagulable state   '[]'$ Anemic  '[]'$ Hepatitis Gastrointestinal:  '[]'$ Blood in stool   '[]'$ Vomiting blood  '[]'$ Gastroesophageal reflux/heartburn   '[]'$ Difficulty swallowing. Genitourinary:  '[x]'$ Chronic kidney disease   '[]'$ Difficult urination  '[]'$ Frequent urination  '[]'$ Burning with urination   '[]'$ Blood in urine Skin:  '[]'$ Rashes   '[]'$ Ulcers   '[]'$ Wounds Psychological:  '[]'$ History of anxiety   '[]'$  History of major depression.  Physical Examination  Vitals:   03/15/21 1058  BP: (!) 162/97  Pulse: 92  Resp: 20  Temp: 98.5 F (36.9 C)  SpO2: 99%  Weight: 108 kg  Height: '5\' 8"'$  (1.727 m)   Body mass index is 36.19 kg/m. Gen: WD/WN, NAD Head: [place]/AT, No temporalis wasting.  Ear/Nose/Throat: Hearing grossly intact, nares w/o erythema or drainage, oropharynx w/o Erythema/Exudate,  Eyes: Conjunctiva clear, sclera non-icteric Neck: Trachea midline.  No JVD.  Pulmonary:  Good air movement, respirations not labored, no use of accessory muscles.  Cardiac: RRR, normal S1, S2. Vascular: good thrill left arm AVF, permcath in right chest Vessel Right Left  Radial Palpable Palpable   Musculoskeletal: M/S 5/5 throughout.  Extremities without ischemic changes.  No deformity or atrophy.  Neurologic: Sensation grossly intact in extremities.  Symmetrical.  Speech is fluent. Motor exam as listed above. Psychiatric: Judgment intact, Mood & affect appropriate for pt's clinical situation. Dermatologic: No rashes or ulcers noted.  No cellulitis or open wounds.    CBC Lab Results  Component Value Date   WBC 7.9 12/09/2020   HGB 11.2 (L) 12/11/2020   HCT 33.0 (L) 12/11/2020   MCV 93.6 12/09/2020   PLT 179 12/09/2020    BMET    Component Value Date/Time   NA 140 12/11/2020 0920   K 4.0 12/11/2020 0920   CL 97 (L) 12/11/2020 0920   CO2 30 12/09/2020 1012   GLUCOSE 85 12/11/2020 0920   BUN 40 (H) 12/11/2020 0920   CREATININE 12.10 (H) 12/11/2020 0920   CALCIUM 8.9 12/09/2020 1012   GFRNONAA 4 (L) 12/09/2020  1012   GFRAA 8 (L) 04/24/2020 1219   CrCl cannot be calculated (Patient's most recent lab result is older than the maximum 21 days allowed.).  COAG Lab Results  Component Value Date   INR 1.1 12/09/2020   INR 1.1 07/13/2020   INR 1.2 04/06/2020    Radiology No results found.  Assessment/Plan 1.  Complication dialysis device:  Patient's Tunneled catheter is not being used. The patient has an extremity access that is functioning well. Therefore, the patient will undergo removal of the tunneled catheter under local anesthesia.  The risks and benefits were described to the patient.  All questions were answered.  The patient agrees to proceed with angiography and intervention. Potassium will be drawn to ensure that it is an appropriate level prior to performing intervention. 2.  End-stage renal disease requiring hemodialysis:  Patient will continue dialysis therapy without further interruption  3.  Hypertension:  Patient will continue medical management; nephrology is following no changes in  oral medications.     [name], MD  03/15/2021 11:05 AM

## 2021-03-16 ENCOUNTER — Encounter: Payer: Self-pay | Admitting: Vascular Surgery

## 2021-03-16 DIAGNOSIS — N186 End stage renal disease: Secondary | ICD-10-CM | POA: Diagnosis not present

## 2021-03-16 DIAGNOSIS — N2581 Secondary hyperparathyroidism of renal origin: Secondary | ICD-10-CM | POA: Diagnosis not present

## 2021-03-16 DIAGNOSIS — Z992 Dependence on renal dialysis: Secondary | ICD-10-CM | POA: Diagnosis not present

## 2021-03-18 DIAGNOSIS — Z992 Dependence on renal dialysis: Secondary | ICD-10-CM | POA: Diagnosis not present

## 2021-03-18 DIAGNOSIS — N186 End stage renal disease: Secondary | ICD-10-CM | POA: Diagnosis not present

## 2021-03-18 DIAGNOSIS — N2581 Secondary hyperparathyroidism of renal origin: Secondary | ICD-10-CM | POA: Diagnosis not present

## 2021-03-19 DIAGNOSIS — F840 Autistic disorder: Secondary | ICD-10-CM | POA: Diagnosis not present

## 2021-03-19 DIAGNOSIS — E559 Vitamin D deficiency, unspecified: Secondary | ICD-10-CM | POA: Diagnosis not present

## 2021-03-19 DIAGNOSIS — N186 End stage renal disease: Secondary | ICD-10-CM | POA: Diagnosis not present

## 2021-03-19 DIAGNOSIS — Z0001 Encounter for general adult medical examination with abnormal findings: Secondary | ICD-10-CM | POA: Diagnosis not present

## 2021-03-19 DIAGNOSIS — I10 Essential (primary) hypertension: Secondary | ICD-10-CM | POA: Diagnosis not present

## 2021-03-20 DIAGNOSIS — N186 End stage renal disease: Secondary | ICD-10-CM | POA: Diagnosis not present

## 2021-03-20 DIAGNOSIS — N2581 Secondary hyperparathyroidism of renal origin: Secondary | ICD-10-CM | POA: Diagnosis not present

## 2021-03-20 DIAGNOSIS — Z992 Dependence on renal dialysis: Secondary | ICD-10-CM | POA: Diagnosis not present

## 2021-03-22 DIAGNOSIS — Z992 Dependence on renal dialysis: Secondary | ICD-10-CM | POA: Diagnosis not present

## 2021-03-22 DIAGNOSIS — N2581 Secondary hyperparathyroidism of renal origin: Secondary | ICD-10-CM | POA: Diagnosis not present

## 2021-03-22 DIAGNOSIS — N186 End stage renal disease: Secondary | ICD-10-CM | POA: Diagnosis not present

## 2021-03-22 DIAGNOSIS — E8779 Other fluid overload: Secondary | ICD-10-CM | POA: Diagnosis not present

## 2021-03-23 DIAGNOSIS — N186 End stage renal disease: Secondary | ICD-10-CM | POA: Diagnosis not present

## 2021-03-23 DIAGNOSIS — N2581 Secondary hyperparathyroidism of renal origin: Secondary | ICD-10-CM | POA: Diagnosis not present

## 2021-03-23 DIAGNOSIS — E8779 Other fluid overload: Secondary | ICD-10-CM | POA: Diagnosis not present

## 2021-03-23 DIAGNOSIS — Z992 Dependence on renal dialysis: Secondary | ICD-10-CM | POA: Diagnosis not present

## 2021-03-25 DIAGNOSIS — N2581 Secondary hyperparathyroidism of renal origin: Secondary | ICD-10-CM | POA: Diagnosis not present

## 2021-03-25 DIAGNOSIS — N186 End stage renal disease: Secondary | ICD-10-CM | POA: Diagnosis not present

## 2021-03-25 DIAGNOSIS — E8779 Other fluid overload: Secondary | ICD-10-CM | POA: Diagnosis not present

## 2021-03-25 DIAGNOSIS — Z992 Dependence on renal dialysis: Secondary | ICD-10-CM | POA: Diagnosis not present

## 2021-03-27 DIAGNOSIS — E8779 Other fluid overload: Secondary | ICD-10-CM | POA: Diagnosis not present

## 2021-03-27 DIAGNOSIS — N2581 Secondary hyperparathyroidism of renal origin: Secondary | ICD-10-CM | POA: Diagnosis not present

## 2021-03-27 DIAGNOSIS — N186 End stage renal disease: Secondary | ICD-10-CM | POA: Diagnosis not present

## 2021-03-27 DIAGNOSIS — Z992 Dependence on renal dialysis: Secondary | ICD-10-CM | POA: Diagnosis not present

## 2021-03-28 DIAGNOSIS — Z992 Dependence on renal dialysis: Secondary | ICD-10-CM | POA: Diagnosis not present

## 2021-03-28 DIAGNOSIS — N186 End stage renal disease: Secondary | ICD-10-CM | POA: Diagnosis not present

## 2021-03-30 DIAGNOSIS — N186 End stage renal disease: Secondary | ICD-10-CM | POA: Diagnosis not present

## 2021-03-30 DIAGNOSIS — Z992 Dependence on renal dialysis: Secondary | ICD-10-CM | POA: Diagnosis not present

## 2021-03-30 DIAGNOSIS — N2581 Secondary hyperparathyroidism of renal origin: Secondary | ICD-10-CM | POA: Diagnosis not present

## 2021-04-01 DIAGNOSIS — N2581 Secondary hyperparathyroidism of renal origin: Secondary | ICD-10-CM | POA: Diagnosis not present

## 2021-04-01 DIAGNOSIS — Z992 Dependence on renal dialysis: Secondary | ICD-10-CM | POA: Diagnosis not present

## 2021-04-01 DIAGNOSIS — N186 End stage renal disease: Secondary | ICD-10-CM | POA: Diagnosis not present

## 2021-04-03 DIAGNOSIS — Z992 Dependence on renal dialysis: Secondary | ICD-10-CM | POA: Diagnosis not present

## 2021-04-03 DIAGNOSIS — N186 End stage renal disease: Secondary | ICD-10-CM | POA: Diagnosis not present

## 2021-04-03 DIAGNOSIS — N2581 Secondary hyperparathyroidism of renal origin: Secondary | ICD-10-CM | POA: Diagnosis not present

## 2021-04-06 DIAGNOSIS — Z992 Dependence on renal dialysis: Secondary | ICD-10-CM | POA: Diagnosis not present

## 2021-04-06 DIAGNOSIS — N186 End stage renal disease: Secondary | ICD-10-CM | POA: Diagnosis not present

## 2021-04-06 DIAGNOSIS — N2581 Secondary hyperparathyroidism of renal origin: Secondary | ICD-10-CM | POA: Diagnosis not present

## 2021-04-08 DIAGNOSIS — N186 End stage renal disease: Secondary | ICD-10-CM | POA: Diagnosis not present

## 2021-04-08 DIAGNOSIS — N2581 Secondary hyperparathyroidism of renal origin: Secondary | ICD-10-CM | POA: Diagnosis not present

## 2021-04-08 DIAGNOSIS — Z992 Dependence on renal dialysis: Secondary | ICD-10-CM | POA: Diagnosis not present

## 2021-04-10 DIAGNOSIS — Z992 Dependence on renal dialysis: Secondary | ICD-10-CM | POA: Diagnosis not present

## 2021-04-10 DIAGNOSIS — N186 End stage renal disease: Secondary | ICD-10-CM | POA: Diagnosis not present

## 2021-04-10 DIAGNOSIS — N2581 Secondary hyperparathyroidism of renal origin: Secondary | ICD-10-CM | POA: Diagnosis not present

## 2021-04-13 DIAGNOSIS — N2581 Secondary hyperparathyroidism of renal origin: Secondary | ICD-10-CM | POA: Diagnosis not present

## 2021-04-13 DIAGNOSIS — Z992 Dependence on renal dialysis: Secondary | ICD-10-CM | POA: Diagnosis not present

## 2021-04-13 DIAGNOSIS — N186 End stage renal disease: Secondary | ICD-10-CM | POA: Diagnosis not present

## 2021-04-15 DIAGNOSIS — N186 End stage renal disease: Secondary | ICD-10-CM | POA: Diagnosis not present

## 2021-04-15 DIAGNOSIS — N2581 Secondary hyperparathyroidism of renal origin: Secondary | ICD-10-CM | POA: Diagnosis not present

## 2021-04-15 DIAGNOSIS — Z992 Dependence on renal dialysis: Secondary | ICD-10-CM | POA: Diagnosis not present

## 2021-04-17 DIAGNOSIS — Z992 Dependence on renal dialysis: Secondary | ICD-10-CM | POA: Diagnosis not present

## 2021-04-17 DIAGNOSIS — N186 End stage renal disease: Secondary | ICD-10-CM | POA: Diagnosis not present

## 2021-04-17 DIAGNOSIS — N2581 Secondary hyperparathyroidism of renal origin: Secondary | ICD-10-CM | POA: Diagnosis not present

## 2021-04-20 DIAGNOSIS — Z992 Dependence on renal dialysis: Secondary | ICD-10-CM | POA: Diagnosis not present

## 2021-04-20 DIAGNOSIS — N2581 Secondary hyperparathyroidism of renal origin: Secondary | ICD-10-CM | POA: Diagnosis not present

## 2021-04-20 DIAGNOSIS — N186 End stage renal disease: Secondary | ICD-10-CM | POA: Diagnosis not present

## 2021-04-22 DIAGNOSIS — N2581 Secondary hyperparathyroidism of renal origin: Secondary | ICD-10-CM | POA: Diagnosis not present

## 2021-04-22 DIAGNOSIS — N186 End stage renal disease: Secondary | ICD-10-CM | POA: Diagnosis not present

## 2021-04-22 DIAGNOSIS — Z992 Dependence on renal dialysis: Secondary | ICD-10-CM | POA: Diagnosis not present

## 2021-04-24 DIAGNOSIS — N186 End stage renal disease: Secondary | ICD-10-CM | POA: Diagnosis not present

## 2021-04-24 DIAGNOSIS — N2581 Secondary hyperparathyroidism of renal origin: Secondary | ICD-10-CM | POA: Diagnosis not present

## 2021-04-24 DIAGNOSIS — Z992 Dependence on renal dialysis: Secondary | ICD-10-CM | POA: Diagnosis not present

## 2021-04-27 DIAGNOSIS — Z992 Dependence on renal dialysis: Secondary | ICD-10-CM | POA: Diagnosis not present

## 2021-04-27 DIAGNOSIS — N2581 Secondary hyperparathyroidism of renal origin: Secondary | ICD-10-CM | POA: Diagnosis not present

## 2021-04-27 DIAGNOSIS — N186 End stage renal disease: Secondary | ICD-10-CM | POA: Diagnosis not present

## 2021-04-28 DIAGNOSIS — Z992 Dependence on renal dialysis: Secondary | ICD-10-CM | POA: Diagnosis not present

## 2021-04-28 DIAGNOSIS — N186 End stage renal disease: Secondary | ICD-10-CM | POA: Diagnosis not present

## 2021-04-29 DIAGNOSIS — N2581 Secondary hyperparathyroidism of renal origin: Secondary | ICD-10-CM | POA: Diagnosis not present

## 2021-04-29 DIAGNOSIS — Z992 Dependence on renal dialysis: Secondary | ICD-10-CM | POA: Diagnosis not present

## 2021-04-29 DIAGNOSIS — N186 End stage renal disease: Secondary | ICD-10-CM | POA: Diagnosis not present

## 2021-05-01 DIAGNOSIS — N2581 Secondary hyperparathyroidism of renal origin: Secondary | ICD-10-CM | POA: Diagnosis not present

## 2021-05-01 DIAGNOSIS — N186 End stage renal disease: Secondary | ICD-10-CM | POA: Diagnosis not present

## 2021-05-01 DIAGNOSIS — Z992 Dependence on renal dialysis: Secondary | ICD-10-CM | POA: Diagnosis not present

## 2021-05-04 DIAGNOSIS — Z992 Dependence on renal dialysis: Secondary | ICD-10-CM | POA: Diagnosis not present

## 2021-05-04 DIAGNOSIS — N186 End stage renal disease: Secondary | ICD-10-CM | POA: Diagnosis not present

## 2021-05-04 DIAGNOSIS — N2581 Secondary hyperparathyroidism of renal origin: Secondary | ICD-10-CM | POA: Diagnosis not present

## 2021-05-06 DIAGNOSIS — Z992 Dependence on renal dialysis: Secondary | ICD-10-CM | POA: Diagnosis not present

## 2021-05-06 DIAGNOSIS — N186 End stage renal disease: Secondary | ICD-10-CM | POA: Diagnosis not present

## 2021-05-06 DIAGNOSIS — N2581 Secondary hyperparathyroidism of renal origin: Secondary | ICD-10-CM | POA: Diagnosis not present

## 2021-05-08 DIAGNOSIS — N2581 Secondary hyperparathyroidism of renal origin: Secondary | ICD-10-CM | POA: Diagnosis not present

## 2021-05-08 DIAGNOSIS — N186 End stage renal disease: Secondary | ICD-10-CM | POA: Diagnosis not present

## 2021-05-08 DIAGNOSIS — Z992 Dependence on renal dialysis: Secondary | ICD-10-CM | POA: Diagnosis not present

## 2021-05-11 DIAGNOSIS — N186 End stage renal disease: Secondary | ICD-10-CM | POA: Diagnosis not present

## 2021-05-11 DIAGNOSIS — Z992 Dependence on renal dialysis: Secondary | ICD-10-CM | POA: Diagnosis not present

## 2021-05-11 DIAGNOSIS — N2581 Secondary hyperparathyroidism of renal origin: Secondary | ICD-10-CM | POA: Diagnosis not present

## 2021-05-13 DIAGNOSIS — Z992 Dependence on renal dialysis: Secondary | ICD-10-CM | POA: Diagnosis not present

## 2021-05-13 DIAGNOSIS — N2581 Secondary hyperparathyroidism of renal origin: Secondary | ICD-10-CM | POA: Diagnosis not present

## 2021-05-13 DIAGNOSIS — N186 End stage renal disease: Secondary | ICD-10-CM | POA: Diagnosis not present

## 2021-05-15 DIAGNOSIS — Z992 Dependence on renal dialysis: Secondary | ICD-10-CM | POA: Diagnosis not present

## 2021-05-15 DIAGNOSIS — N186 End stage renal disease: Secondary | ICD-10-CM | POA: Diagnosis not present

## 2021-05-15 DIAGNOSIS — N2581 Secondary hyperparathyroidism of renal origin: Secondary | ICD-10-CM | POA: Diagnosis not present

## 2021-05-18 DIAGNOSIS — N2581 Secondary hyperparathyroidism of renal origin: Secondary | ICD-10-CM | POA: Diagnosis not present

## 2021-05-18 DIAGNOSIS — N186 End stage renal disease: Secondary | ICD-10-CM | POA: Diagnosis not present

## 2021-05-18 DIAGNOSIS — Z992 Dependence on renal dialysis: Secondary | ICD-10-CM | POA: Diagnosis not present

## 2021-05-20 DIAGNOSIS — Z992 Dependence on renal dialysis: Secondary | ICD-10-CM | POA: Diagnosis not present

## 2021-05-20 DIAGNOSIS — N2581 Secondary hyperparathyroidism of renal origin: Secondary | ICD-10-CM | POA: Diagnosis not present

## 2021-05-20 DIAGNOSIS — N186 End stage renal disease: Secondary | ICD-10-CM | POA: Diagnosis not present

## 2021-05-22 DIAGNOSIS — N186 End stage renal disease: Secondary | ICD-10-CM | POA: Diagnosis not present

## 2021-05-22 DIAGNOSIS — N2581 Secondary hyperparathyroidism of renal origin: Secondary | ICD-10-CM | POA: Diagnosis not present

## 2021-05-22 DIAGNOSIS — Z992 Dependence on renal dialysis: Secondary | ICD-10-CM | POA: Diagnosis not present

## 2021-05-25 DIAGNOSIS — N2581 Secondary hyperparathyroidism of renal origin: Secondary | ICD-10-CM | POA: Diagnosis not present

## 2021-05-25 DIAGNOSIS — N186 End stage renal disease: Secondary | ICD-10-CM | POA: Diagnosis not present

## 2021-05-25 DIAGNOSIS — Z992 Dependence on renal dialysis: Secondary | ICD-10-CM | POA: Diagnosis not present

## 2021-05-27 DIAGNOSIS — Z992 Dependence on renal dialysis: Secondary | ICD-10-CM | POA: Diagnosis not present

## 2021-05-27 DIAGNOSIS — N2581 Secondary hyperparathyroidism of renal origin: Secondary | ICD-10-CM | POA: Diagnosis not present

## 2021-05-27 DIAGNOSIS — N186 End stage renal disease: Secondary | ICD-10-CM | POA: Diagnosis not present

## 2021-05-28 DIAGNOSIS — N186 End stage renal disease: Secondary | ICD-10-CM | POA: Diagnosis not present

## 2021-05-28 DIAGNOSIS — Z992 Dependence on renal dialysis: Secondary | ICD-10-CM | POA: Diagnosis not present

## 2021-05-29 DIAGNOSIS — N2581 Secondary hyperparathyroidism of renal origin: Secondary | ICD-10-CM | POA: Diagnosis not present

## 2021-05-29 DIAGNOSIS — Z992 Dependence on renal dialysis: Secondary | ICD-10-CM | POA: Diagnosis not present

## 2021-05-29 DIAGNOSIS — N186 End stage renal disease: Secondary | ICD-10-CM | POA: Diagnosis not present

## 2021-06-01 DIAGNOSIS — N186 End stage renal disease: Secondary | ICD-10-CM | POA: Diagnosis not present

## 2021-06-01 DIAGNOSIS — N2581 Secondary hyperparathyroidism of renal origin: Secondary | ICD-10-CM | POA: Diagnosis not present

## 2021-06-01 DIAGNOSIS — Z992 Dependence on renal dialysis: Secondary | ICD-10-CM | POA: Diagnosis not present

## 2021-06-03 DIAGNOSIS — N186 End stage renal disease: Secondary | ICD-10-CM | POA: Diagnosis not present

## 2021-06-03 DIAGNOSIS — N2581 Secondary hyperparathyroidism of renal origin: Secondary | ICD-10-CM | POA: Diagnosis not present

## 2021-06-03 DIAGNOSIS — Z992 Dependence on renal dialysis: Secondary | ICD-10-CM | POA: Diagnosis not present

## 2021-06-05 DIAGNOSIS — N2581 Secondary hyperparathyroidism of renal origin: Secondary | ICD-10-CM | POA: Diagnosis not present

## 2021-06-05 DIAGNOSIS — Z992 Dependence on renal dialysis: Secondary | ICD-10-CM | POA: Diagnosis not present

## 2021-06-05 DIAGNOSIS — N186 End stage renal disease: Secondary | ICD-10-CM | POA: Diagnosis not present

## 2021-06-08 DIAGNOSIS — N186 End stage renal disease: Secondary | ICD-10-CM | POA: Diagnosis not present

## 2021-06-08 DIAGNOSIS — Z992 Dependence on renal dialysis: Secondary | ICD-10-CM | POA: Diagnosis not present

## 2021-06-08 DIAGNOSIS — N2581 Secondary hyperparathyroidism of renal origin: Secondary | ICD-10-CM | POA: Diagnosis not present

## 2021-06-10 DIAGNOSIS — N186 End stage renal disease: Secondary | ICD-10-CM | POA: Diagnosis not present

## 2021-06-10 DIAGNOSIS — N2581 Secondary hyperparathyroidism of renal origin: Secondary | ICD-10-CM | POA: Diagnosis not present

## 2021-06-10 DIAGNOSIS — Z992 Dependence on renal dialysis: Secondary | ICD-10-CM | POA: Diagnosis not present

## 2021-06-12 DIAGNOSIS — Z992 Dependence on renal dialysis: Secondary | ICD-10-CM | POA: Diagnosis not present

## 2021-06-12 DIAGNOSIS — N2581 Secondary hyperparathyroidism of renal origin: Secondary | ICD-10-CM | POA: Diagnosis not present

## 2021-06-12 DIAGNOSIS — N186 End stage renal disease: Secondary | ICD-10-CM | POA: Diagnosis not present

## 2021-06-15 DIAGNOSIS — N186 End stage renal disease: Secondary | ICD-10-CM | POA: Diagnosis not present

## 2021-06-15 DIAGNOSIS — N2581 Secondary hyperparathyroidism of renal origin: Secondary | ICD-10-CM | POA: Diagnosis not present

## 2021-06-15 DIAGNOSIS — Z992 Dependence on renal dialysis: Secondary | ICD-10-CM | POA: Diagnosis not present

## 2021-06-17 DIAGNOSIS — N2581 Secondary hyperparathyroidism of renal origin: Secondary | ICD-10-CM | POA: Diagnosis not present

## 2021-06-17 DIAGNOSIS — Z992 Dependence on renal dialysis: Secondary | ICD-10-CM | POA: Diagnosis not present

## 2021-06-17 DIAGNOSIS — N186 End stage renal disease: Secondary | ICD-10-CM | POA: Diagnosis not present

## 2021-06-19 ENCOUNTER — Other Ambulatory Visit: Payer: Self-pay | Admitting: Physician Assistant

## 2021-06-19 DIAGNOSIS — Z992 Dependence on renal dialysis: Secondary | ICD-10-CM | POA: Diagnosis not present

## 2021-06-19 DIAGNOSIS — N2581 Secondary hyperparathyroidism of renal origin: Secondary | ICD-10-CM | POA: Diagnosis not present

## 2021-06-19 DIAGNOSIS — N186 End stage renal disease: Secondary | ICD-10-CM | POA: Diagnosis not present

## 2021-06-21 DIAGNOSIS — F840 Autistic disorder: Secondary | ICD-10-CM | POA: Diagnosis not present

## 2021-06-21 DIAGNOSIS — N186 End stage renal disease: Secondary | ICD-10-CM | POA: Diagnosis not present

## 2021-06-21 DIAGNOSIS — E559 Vitamin D deficiency, unspecified: Secondary | ICD-10-CM | POA: Diagnosis not present

## 2021-06-21 DIAGNOSIS — I10 Essential (primary) hypertension: Secondary | ICD-10-CM | POA: Diagnosis not present

## 2021-06-22 DIAGNOSIS — N186 End stage renal disease: Secondary | ICD-10-CM | POA: Diagnosis not present

## 2021-06-22 DIAGNOSIS — N2581 Secondary hyperparathyroidism of renal origin: Secondary | ICD-10-CM | POA: Diagnosis not present

## 2021-06-22 DIAGNOSIS — Z992 Dependence on renal dialysis: Secondary | ICD-10-CM | POA: Diagnosis not present

## 2021-06-24 DIAGNOSIS — Z992 Dependence on renal dialysis: Secondary | ICD-10-CM | POA: Diagnosis not present

## 2021-06-24 DIAGNOSIS — N186 End stage renal disease: Secondary | ICD-10-CM | POA: Diagnosis not present

## 2021-06-24 DIAGNOSIS — N2581 Secondary hyperparathyroidism of renal origin: Secondary | ICD-10-CM | POA: Diagnosis not present

## 2021-06-26 DIAGNOSIS — N186 End stage renal disease: Secondary | ICD-10-CM | POA: Diagnosis not present

## 2021-06-26 DIAGNOSIS — Z992 Dependence on renal dialysis: Secondary | ICD-10-CM | POA: Diagnosis not present

## 2021-06-26 DIAGNOSIS — N2581 Secondary hyperparathyroidism of renal origin: Secondary | ICD-10-CM | POA: Diagnosis not present

## 2021-06-28 DIAGNOSIS — N186 End stage renal disease: Secondary | ICD-10-CM | POA: Diagnosis not present

## 2021-06-28 DIAGNOSIS — Z992 Dependence on renal dialysis: Secondary | ICD-10-CM | POA: Diagnosis not present

## 2021-06-29 DIAGNOSIS — N2581 Secondary hyperparathyroidism of renal origin: Secondary | ICD-10-CM | POA: Diagnosis not present

## 2021-06-29 DIAGNOSIS — Z992 Dependence on renal dialysis: Secondary | ICD-10-CM | POA: Diagnosis not present

## 2021-06-29 DIAGNOSIS — N186 End stage renal disease: Secondary | ICD-10-CM | POA: Diagnosis not present

## 2021-07-01 DIAGNOSIS — N2581 Secondary hyperparathyroidism of renal origin: Secondary | ICD-10-CM | POA: Diagnosis not present

## 2021-07-01 DIAGNOSIS — Z992 Dependence on renal dialysis: Secondary | ICD-10-CM | POA: Diagnosis not present

## 2021-07-01 DIAGNOSIS — N186 End stage renal disease: Secondary | ICD-10-CM | POA: Diagnosis not present

## 2021-07-03 DIAGNOSIS — Z992 Dependence on renal dialysis: Secondary | ICD-10-CM | POA: Diagnosis not present

## 2021-07-03 DIAGNOSIS — N2581 Secondary hyperparathyroidism of renal origin: Secondary | ICD-10-CM | POA: Diagnosis not present

## 2021-07-03 DIAGNOSIS — N186 End stage renal disease: Secondary | ICD-10-CM | POA: Diagnosis not present

## 2021-07-06 DIAGNOSIS — Z992 Dependence on renal dialysis: Secondary | ICD-10-CM | POA: Diagnosis not present

## 2021-07-06 DIAGNOSIS — N186 End stage renal disease: Secondary | ICD-10-CM | POA: Diagnosis not present

## 2021-07-06 DIAGNOSIS — N2581 Secondary hyperparathyroidism of renal origin: Secondary | ICD-10-CM | POA: Diagnosis not present

## 2021-07-08 DIAGNOSIS — N2581 Secondary hyperparathyroidism of renal origin: Secondary | ICD-10-CM | POA: Diagnosis not present

## 2021-07-08 DIAGNOSIS — Z992 Dependence on renal dialysis: Secondary | ICD-10-CM | POA: Diagnosis not present

## 2021-07-08 DIAGNOSIS — N186 End stage renal disease: Secondary | ICD-10-CM | POA: Diagnosis not present

## 2021-07-10 DIAGNOSIS — N2581 Secondary hyperparathyroidism of renal origin: Secondary | ICD-10-CM | POA: Diagnosis not present

## 2021-07-10 DIAGNOSIS — N186 End stage renal disease: Secondary | ICD-10-CM | POA: Diagnosis not present

## 2021-07-10 DIAGNOSIS — Z992 Dependence on renal dialysis: Secondary | ICD-10-CM | POA: Diagnosis not present

## 2021-07-13 DIAGNOSIS — Z992 Dependence on renal dialysis: Secondary | ICD-10-CM | POA: Diagnosis not present

## 2021-07-13 DIAGNOSIS — N2581 Secondary hyperparathyroidism of renal origin: Secondary | ICD-10-CM | POA: Diagnosis not present

## 2021-07-13 DIAGNOSIS — N186 End stage renal disease: Secondary | ICD-10-CM | POA: Diagnosis not present

## 2021-07-15 DIAGNOSIS — Z992 Dependence on renal dialysis: Secondary | ICD-10-CM | POA: Diagnosis not present

## 2021-07-15 DIAGNOSIS — N186 End stage renal disease: Secondary | ICD-10-CM | POA: Diagnosis not present

## 2021-07-15 DIAGNOSIS — N2581 Secondary hyperparathyroidism of renal origin: Secondary | ICD-10-CM | POA: Diagnosis not present

## 2021-07-17 DIAGNOSIS — N186 End stage renal disease: Secondary | ICD-10-CM | POA: Diagnosis not present

## 2021-07-17 DIAGNOSIS — N2581 Secondary hyperparathyroidism of renal origin: Secondary | ICD-10-CM | POA: Diagnosis not present

## 2021-07-17 DIAGNOSIS — Z992 Dependence on renal dialysis: Secondary | ICD-10-CM | POA: Diagnosis not present

## 2021-07-20 DIAGNOSIS — N2581 Secondary hyperparathyroidism of renal origin: Secondary | ICD-10-CM | POA: Diagnosis not present

## 2021-07-20 DIAGNOSIS — Z992 Dependence on renal dialysis: Secondary | ICD-10-CM | POA: Diagnosis not present

## 2021-07-20 DIAGNOSIS — N186 End stage renal disease: Secondary | ICD-10-CM | POA: Diagnosis not present

## 2021-07-21 ENCOUNTER — Other Ambulatory Visit (INDEPENDENT_AMBULATORY_CARE_PROVIDER_SITE_OTHER): Payer: Self-pay | Admitting: Vascular Surgery

## 2021-07-21 DIAGNOSIS — N186 End stage renal disease: Secondary | ICD-10-CM

## 2021-07-23 DIAGNOSIS — N186 End stage renal disease: Secondary | ICD-10-CM | POA: Diagnosis not present

## 2021-07-23 DIAGNOSIS — Z992 Dependence on renal dialysis: Secondary | ICD-10-CM | POA: Diagnosis not present

## 2021-07-23 DIAGNOSIS — N2581 Secondary hyperparathyroidism of renal origin: Secondary | ICD-10-CM | POA: Diagnosis not present

## 2021-07-25 DIAGNOSIS — Z992 Dependence on renal dialysis: Secondary | ICD-10-CM | POA: Diagnosis not present

## 2021-07-25 DIAGNOSIS — N2581 Secondary hyperparathyroidism of renal origin: Secondary | ICD-10-CM | POA: Diagnosis not present

## 2021-07-25 DIAGNOSIS — N186 End stage renal disease: Secondary | ICD-10-CM | POA: Diagnosis not present

## 2021-07-26 ENCOUNTER — Other Ambulatory Visit: Payer: Medicare HMO

## 2021-07-26 ENCOUNTER — Other Ambulatory Visit: Payer: Self-pay

## 2021-07-26 ENCOUNTER — Encounter (INDEPENDENT_AMBULATORY_CARE_PROVIDER_SITE_OTHER): Payer: Medicare HMO

## 2021-07-26 ENCOUNTER — Ambulatory Visit (INDEPENDENT_AMBULATORY_CARE_PROVIDER_SITE_OTHER): Payer: Medicare HMO | Admitting: Vascular Surgery

## 2021-07-26 DIAGNOSIS — Z87898 Personal history of other specified conditions: Secondary | ICD-10-CM

## 2021-07-26 DIAGNOSIS — R972 Elevated prostate specific antigen [PSA]: Secondary | ICD-10-CM | POA: Diagnosis not present

## 2021-07-27 ENCOUNTER — Other Ambulatory Visit: Payer: Medicare Other

## 2021-07-27 DIAGNOSIS — N186 End stage renal disease: Secondary | ICD-10-CM | POA: Diagnosis not present

## 2021-07-27 DIAGNOSIS — N2581 Secondary hyperparathyroidism of renal origin: Secondary | ICD-10-CM | POA: Diagnosis not present

## 2021-07-27 DIAGNOSIS — Z992 Dependence on renal dialysis: Secondary | ICD-10-CM | POA: Diagnosis not present

## 2021-07-27 LAB — PSA: Prostate Specific Ag, Serum: 0.8 ng/mL (ref 0.0–4.0)

## 2021-07-28 DIAGNOSIS — Z992 Dependence on renal dialysis: Secondary | ICD-10-CM | POA: Diagnosis not present

## 2021-07-28 DIAGNOSIS — N186 End stage renal disease: Secondary | ICD-10-CM | POA: Diagnosis not present

## 2021-07-29 ENCOUNTER — Ambulatory Visit: Payer: Self-pay | Admitting: Urology

## 2021-07-29 DIAGNOSIS — Z992 Dependence on renal dialysis: Secondary | ICD-10-CM | POA: Diagnosis not present

## 2021-07-29 DIAGNOSIS — N2581 Secondary hyperparathyroidism of renal origin: Secondary | ICD-10-CM | POA: Diagnosis not present

## 2021-07-29 DIAGNOSIS — N186 End stage renal disease: Secondary | ICD-10-CM | POA: Diagnosis not present

## 2021-07-30 ENCOUNTER — Ambulatory Visit (INDEPENDENT_AMBULATORY_CARE_PROVIDER_SITE_OTHER): Payer: Medicare HMO | Admitting: Urology

## 2021-07-30 ENCOUNTER — Encounter: Payer: Self-pay | Admitting: Urology

## 2021-07-30 ENCOUNTER — Other Ambulatory Visit: Payer: Self-pay

## 2021-07-30 VITALS — BP 132/86 | HR 89 | Ht 68.0 in | Wt 245.0 lb

## 2021-07-30 DIAGNOSIS — Z87898 Personal history of other specified conditions: Secondary | ICD-10-CM

## 2021-07-30 DIAGNOSIS — R339 Retention of urine, unspecified: Secondary | ICD-10-CM

## 2021-07-30 LAB — BLADDER SCAN AMB NON-IMAGING: Scan Result: 8

## 2021-07-30 MED ORDER — DUTASTERIDE 0.5 MG PO CAPS: 0.5000 mg | ORAL_CAPSULE | Freq: Every day | ORAL | 11 refills | Status: DC

## 2021-07-30 NOTE — Progress Notes (Signed)
07/30/2021 1:35 PM   Joseph Gilmore 10/30/1970 [id number]  Referring provider: Jodi Marble, MD [place],  [place] [id number]  Chief Complaint  Patient presents with   Benign Prostatic Hypertrophy    Urologic history:  Hospitalized 03/2020 with acute on chronic renal failure; found to be in urinary retention PSA 26.65 after Foley catheter placement Subsequent PSA has been normal CT estimated 66 g prostate Started on tamsulosin/dutasteride PSA has returned normal   HPI: 50 y.o. male presents for annual follow-up.  He presents today with his father who provided history  No problems since last visit On dialysis and urine output is unknown.  His father states that [name] tells him he does urinate and has no complaints PSA 07/26/2021 was 0.8 (uncorrected) No dysuria or gross hematuria   PMH: Past Medical History:  Diagnosis Date   Autism    Chronic kidney disease    Hypertension    Murmur    OCD (obsessive compulsive disorder)     Surgical History: Past Surgical History:  Procedure Laterality Date   A/V FISTULAGRAM Left 10/07/2020   Procedure: A/V FISTULAGRAM;  Surgeon: Katha Cabal, MD;  Location: [facility] [facility];  Service: Cardiovascular;  Laterality: Left;   AV FISTULA PLACEMENT Left 07/15/2020   Procedure: ARTERIOVENOUS FISTULA CREATION;  Surgeon: Katha Cabal, MD;  Location: [facility];  Service: Vascular;  Laterality: Left;   DIALYSIS/PERMA CATHETER INSERTION N/A 04/06/2020   Procedure: DIALYSIS/PERMA CATHETER INSERTION;  Surgeon: Algernon Huxley, MD;  Location: [place];  Service: Cardiovascular;  Laterality: N/A;   DIALYSIS/PERMA CATHETER INSERTION N/A 05/26/2020   Procedure: DIALYSIS/PERMA CATHETER INSERTION;  Surgeon: Katha Cabal, MD;  Location: [place] [facility];  Service: Cardiovascular;  Laterality: N/A;   DIALYSIS/PERMA CATHETER INSERTION N/A 07/09/2020   Procedure: DIALYSIS/PERMA CATHETER INSERTION;   Surgeon: Algernon Huxley, MD;  Location: [place] [facility];  Service: Cardiovascular;  Laterality: N/A;   DIALYSIS/PERMA CATHETER INSERTION N/A 01/08/2021   Procedure: DIALYSIS/PERMA CATHETER INSERTION;  Surgeon: Algernon Huxley, MD;  Location: [facility] [facility];  Service: Cardiovascular;  Laterality: N/A;   DIALYSIS/PERMA CATHETER REMOVAL N/A 03/15/2021   Procedure: DIALYSIS/PERMA CATHETER REMOVAL;  Surgeon: Algernon Huxley, MD;  Location: [place] [facility];  Service: Cardiovascular;  Laterality: N/A;   REVISON OF ARTERIOVENOUS FISTULA Left 12/11/2020   Procedure: ARTERIOVENOUS (AV) FISTULA CREATION ( BRACHIAL CEPHALIC );  Surgeon: Katha Cabal, MD;  Location: [facility];  Service: Vascular;  Laterality: Left;    Home Medications:  Allergies as of 07/30/2021       Reactions   Chlorhexidine Other (See Comments)   Blisters (topical)        Medication List        Accurate as of July 30, 2021  1:35 PM. If you have any questions, ask your nurse or doctor.          STOP taking these medications    HYDROcodone-acetaminophen 5-325 MG tablet Commonly known as: Norco Stopped by: Abbie Sons, MD       TAKE these medications    calcium carbonate 500 MG chewable tablet Commonly known as: TUMS - dosed in mg elemental calcium Chew 2 tablets by mouth daily.   dutasteride 0.5 MG capsule Commonly known as: AVODART TAKE 1 CAPSULE BY MOUTH ONCE DAILY   losartan 100 MG tablet Commonly known as: COZAAR Take 50 mg by mouth daily.   midodrine 5 MG tablet Commonly  known as: PROAMATINE Take 5 mg by mouth daily as needed.   MIRCERA IJ Mircera   RENA-VITE PO Take 1 tablet by mouth daily.   tamsulosin 0.4 MG Caps capsule Commonly known as: FLOMAX TAKE 1 CAPSULE BY MOUTH ONCE DAILY   Vitamin D-3 125 MCG (5000 UT) Tabs Take 5,000 Units by mouth daily.        Allergies:  Allergies  Allergen Reactions   Chlorhexidine Other (See Comments)    Blisters (topical)     Family History: History reviewed. No pertinent family history.  Social History:  reports that he has never smoked. He has never used smokeless tobacco. He reports that he does not drink alcohol and does not use drugs.   Physical Exam: BP 132/86   Pulse 89   Ht 5\' 8"  (1.727 m)   Wt 245 lb (111.1 kg)   BMI 37.25 kg/m   Constitutional:  Alert, No acute distress. HEENT: [place] AT, moist mucus membranes.  Trachea midline, no masses. Cardiovascular: No clubbing, cyanosis, or edema. Respiratory: Normal respiratory effort, no increased work of breathing.    Assessment & Plan:    1.  History elevated PSA Uncorrected PSA 0.8 Continue annual follow-up  2.  BPH with history of urinary retention He was unable to give a urine sample today and bladder scan for volume was 0 mL Discontinue tamsulosin Avodart refilled   Abbie Sons, MD  [facility] [address] [place], [place] [address] [phone]

## 2021-07-31 DIAGNOSIS — N186 End stage renal disease: Secondary | ICD-10-CM | POA: Diagnosis not present

## 2021-07-31 DIAGNOSIS — Z992 Dependence on renal dialysis: Secondary | ICD-10-CM | POA: Diagnosis not present

## 2021-07-31 DIAGNOSIS — N2581 Secondary hyperparathyroidism of renal origin: Secondary | ICD-10-CM | POA: Diagnosis not present

## 2021-08-03 DIAGNOSIS — N2581 Secondary hyperparathyroidism of renal origin: Secondary | ICD-10-CM | POA: Diagnosis not present

## 2021-08-03 DIAGNOSIS — N186 End stage renal disease: Secondary | ICD-10-CM | POA: Diagnosis not present

## 2021-08-03 DIAGNOSIS — Z992 Dependence on renal dialysis: Secondary | ICD-10-CM | POA: Diagnosis not present

## 2021-08-05 DIAGNOSIS — N2581 Secondary hyperparathyroidism of renal origin: Secondary | ICD-10-CM | POA: Diagnosis not present

## 2021-08-05 DIAGNOSIS — N186 End stage renal disease: Secondary | ICD-10-CM | POA: Diagnosis not present

## 2021-08-05 DIAGNOSIS — Z992 Dependence on renal dialysis: Secondary | ICD-10-CM | POA: Diagnosis not present

## 2021-08-07 DIAGNOSIS — N186 End stage renal disease: Secondary | ICD-10-CM | POA: Diagnosis not present

## 2021-08-07 DIAGNOSIS — Z992 Dependence on renal dialysis: Secondary | ICD-10-CM | POA: Diagnosis not present

## 2021-08-07 DIAGNOSIS — N2581 Secondary hyperparathyroidism of renal origin: Secondary | ICD-10-CM | POA: Diagnosis not present

## 2021-08-09 ENCOUNTER — Encounter (INDEPENDENT_AMBULATORY_CARE_PROVIDER_SITE_OTHER): Payer: Self-pay | Admitting: Vascular Surgery

## 2021-08-09 ENCOUNTER — Ambulatory Visit (INDEPENDENT_AMBULATORY_CARE_PROVIDER_SITE_OTHER): Payer: Medicare HMO

## 2021-08-09 ENCOUNTER — Ambulatory Visit (INDEPENDENT_AMBULATORY_CARE_PROVIDER_SITE_OTHER): Payer: Medicare HMO | Admitting: Vascular Surgery

## 2021-08-09 ENCOUNTER — Other Ambulatory Visit: Payer: Self-pay

## 2021-08-09 VITALS — BP 137/72 | HR 59 | Resp 16 | Ht 63.0 in | Wt 246.0 lb

## 2021-08-09 DIAGNOSIS — Z992 Dependence on renal dialysis: Secondary | ICD-10-CM

## 2021-08-09 DIAGNOSIS — I10 Essential (primary) hypertension: Secondary | ICD-10-CM

## 2021-08-09 DIAGNOSIS — N186 End stage renal disease: Secondary | ICD-10-CM

## 2021-08-09 DIAGNOSIS — T829XXS Unspecified complication of cardiac and vascular prosthetic device, implant and graft, sequela: Secondary | ICD-10-CM

## 2021-08-09 NOTE — Progress Notes (Signed)
MRN : [id number]  Joseph Gilmore is a 50 y.o. (11-17-1970) male who presents with chief complaint of check access.  History of Present Illness:   The patient returns to the office for followup status post intervention of the dialysis access 12/10/2020. Following the intervention the access function has significantly improved, with better flow rates and improved KT/V. The patient has not been experiencing increased bleeding times following decannulation and the patient denies increased recirculation. The patient denies an increase in arm swelling. At the present time the patient denies hand pain.  The patient denies amaurosis fugax or recent TIA symptoms. There are no recent neurological changes noted. The patient denies claudication symptoms or rest pain symptoms. The patient denies history of DVT, PE or superficial thrombophlebitis. The patient denies recent episodes of angina or shortness of breath.   Duplex ultrasound of the AV access shows a patent access.  The previously noted stenosis is unchanged compared to last study.      No outpatient medications have been marked as taking for the 08/09/21 encounter (Appointment) with Delana Meyer, Dolores Lory, MD.    Past Medical History:  Diagnosis Date   Autism    Chronic kidney disease    Hypertension    Murmur    OCD (obsessive compulsive disorder)     Past Surgical History:  Procedure Laterality Date   A/V FISTULAGRAM Left 10/07/2020   Procedure: A/V FISTULAGRAM;  Surgeon: Katha Cabal, MD;  Location: [facility];  Service: Cardiovascular;  Laterality: Left;   AV FISTULA PLACEMENT Left 07/15/2020   Procedure: ARTERIOVENOUS FISTULA CREATION;  Surgeon: Katha Cabal, MD;  Location: [facility];  Service: Vascular;  Laterality: Left;   DIALYSIS/PERMA CATHETER INSERTION N/A 04/06/2020   Procedure: DIALYSIS/PERMA CATHETER INSERTION;  Surgeon: Algernon Huxley, MD;  Location: [place] [facility];  Service: Cardiovascular;   Laterality: N/A;   DIALYSIS/PERMA CATHETER INSERTION N/A 05/26/2020   Procedure: DIALYSIS/PERMA CATHETER INSERTION;  Surgeon: Katha Cabal, MD;  Location: [place] [facility];  Service: Cardiovascular;  Laterality: N/A;   DIALYSIS/PERMA CATHETER INSERTION N/A 07/09/2020   Procedure: DIALYSIS/PERMA CATHETER INSERTION;  Surgeon: Algernon Huxley, MD;  Location: [place];  Service: Cardiovascular;  Laterality: N/A;   DIALYSIS/PERMA CATHETER INSERTION N/A 01/08/2021   Procedure: DIALYSIS/PERMA CATHETER INSERTION;  Surgeon: Algernon Huxley, MD;  Location: [place] [facility];  Service: Cardiovascular;  Laterality: N/A;   DIALYSIS/PERMA CATHETER REMOVAL N/A 03/15/2021   Procedure: DIALYSIS/PERMA CATHETER REMOVAL;  Surgeon: Algernon Huxley, MD;  Location: [place] [facility];  Service: Cardiovascular;  Laterality: N/A;   REVISON OF ARTERIOVENOUS FISTULA Left 12/11/2020   Procedure: ARTERIOVENOUS (AV) FISTULA CREATION ( BRACHIAL CEPHALIC );  Surgeon: Katha Cabal, MD;  Location: [facility];  Service: Vascular;  Laterality: Left;    Social History Social History   Tobacco Use   Smoking status: Never   Smokeless tobacco: Never  Vaping Use   Vaping Use: Never used  Substance Use Topics   Alcohol use: Never   Drug use: Never    Family History No family history on file.  Allergies  Allergen Reactions   Chlorhexidine Other (See Comments)    Blisters (topical)     REVIEW OF SYSTEMS (Negative unless checked)  Constitutional: [] Weight loss  [] Fever  [] Chills Cardiac: [] Chest pain   [] Chest pressure   [] Palpitations   [] Shortness of breath when laying flat   [] Shortness of breath with exertion. Vascular:  [] Pain in  legs with walking   [] Pain in legs at rest  [] History of DVT   [] Phlebitis   [] Swelling in legs   [] Varicose veins   [] Non-healing ulcers Pulmonary:   [] Uses home oxygen   [] Productive cough   [] Hemoptysis   [] Wheeze  [] COPD   [] Asthma Neurologic:  [] Dizziness    [] Seizures   [] History of stroke   [] History of TIA  [] Aphasia   [] Vissual changes   [] Weakness or numbness in arm   [] Weakness or numbness in leg Musculoskeletal:   [] Joint swelling   [] Joint pain   [] Low back pain Hematologic:  [] Easy bruising  [] Easy bleeding   [] Hypercoagulable state   [] Anemic Gastrointestinal:  [] Diarrhea   [] Vomiting  [] Gastroesophageal reflux/heartburn   [] Difficulty swallowing. Genitourinary:  [x] Chronic kidney disease   [] Difficult urination  [] Frequent urination   [] Blood in urine Skin:  [] Rashes   [] Ulcers  Psychological:  [] History of anxiety   []  History of major depression.  Physical Examination  There were no vitals filed for this visit. There is no height or weight on file to calculate BMI. Gen: WD/WN, NAD Head: [place]/AT, No temporalis wasting.  Ear/Nose/Throat: Hearing grossly intact, nares w/o erythema or drainage Eyes: PER, EOMI, sclera nonicteric.  Neck: Supple, no gross masses or lesions.  No JVD.  Pulmonary:  Good air movement, no audible wheezing, no use of accessory muscles.  Cardiac: RRR, precordium non-hyperdynamic. Vascular:   left arm access good thrill good bruit Vessel Right Left  Radial Palpable Palpable  Brachial Palpable Palpable  Gastrointestinal: soft, non-distended. No guarding/no peritoneal signs.  Musculoskeletal: M/S 5/5 throughout.  No deformity.  Neurologic: CN 2-12 intact. Pain and light touch intact in extremities.  Symmetrical.  Speech is fluent. Motor exam as listed above. Psychiatric: Judgment intact, Mood & affect appropriate for pt's clinical situation. Dermatologic: No rashes or ulcers noted.  No changes consistent with cellulitis.   CBC Lab Results  Component Value Date   WBC 7.9 12/09/2020   HGB 11.2 (L) 12/11/2020   HCT 33.0 (L) 12/11/2020   MCV 93.6 12/09/2020   PLT 179 12/09/2020    BMET    Component Value Date/Time   NA 140 12/11/2020 0920   K 4.0 12/11/2020 0920   CL 97 (L) 12/11/2020 0920   CO2 30  12/09/2020 1012   GLUCOSE 85 12/11/2020 0920   BUN 40 (H) 12/11/2020 0920   CREATININE 12.10 (H) 12/11/2020 0920   CALCIUM 8.9 12/09/2020 1012   GFRNONAA 4 (L) 12/09/2020 1012   GFRAA 8 (L) 04/24/2020 1219   CrCl cannot be calculated (Patient's most recent lab result is older than the maximum 21 days allowed.).  COAG Lab Results  Component Value Date   INR 1.1 12/09/2020   INR 1.1 07/13/2020   INR 1.2 04/06/2020    Radiology No results found.   Assessment/Plan 1. ESRD on hemodialysis [facility]) Recommend:  The patient is doing well and currently has adequate dialysis access. The patient's dialysis center is not reporting any access issues. Flow pattern is stable when compared to the prior ultrasound.  The patient should have a duplex ultrasound of the dialysis access in 12 months. The patient will follow-up with me in the office after each ultrasound    - VAS [place] (AVF, AVG); Future  2. Essential hypertension Continue antihypertensive medications as already ordered, these medications have been reviewed and there are no changes at this time.   3. Complication of vascular access for dialysis, sequela See #1  [name], MD  08/09/2021 1:46 PM

## 2021-08-10 DIAGNOSIS — Z992 Dependence on renal dialysis: Secondary | ICD-10-CM | POA: Diagnosis not present

## 2021-08-10 DIAGNOSIS — N2581 Secondary hyperparathyroidism of renal origin: Secondary | ICD-10-CM | POA: Diagnosis not present

## 2021-08-10 DIAGNOSIS — N186 End stage renal disease: Secondary | ICD-10-CM | POA: Diagnosis not present

## 2021-08-12 DIAGNOSIS — N2581 Secondary hyperparathyroidism of renal origin: Secondary | ICD-10-CM | POA: Diagnosis not present

## 2021-08-12 DIAGNOSIS — Z992 Dependence on renal dialysis: Secondary | ICD-10-CM | POA: Diagnosis not present

## 2021-08-12 DIAGNOSIS — N186 End stage renal disease: Secondary | ICD-10-CM | POA: Diagnosis not present

## 2021-08-14 ENCOUNTER — Encounter (INDEPENDENT_AMBULATORY_CARE_PROVIDER_SITE_OTHER): Payer: Self-pay | Admitting: Vascular Surgery

## 2021-08-14 DIAGNOSIS — N186 End stage renal disease: Secondary | ICD-10-CM | POA: Diagnosis not present

## 2021-08-14 DIAGNOSIS — Z992 Dependence on renal dialysis: Secondary | ICD-10-CM | POA: Diagnosis not present

## 2021-08-14 DIAGNOSIS — N2581 Secondary hyperparathyroidism of renal origin: Secondary | ICD-10-CM | POA: Diagnosis not present

## 2021-08-17 DIAGNOSIS — N186 End stage renal disease: Secondary | ICD-10-CM | POA: Diagnosis not present

## 2021-08-17 DIAGNOSIS — N2581 Secondary hyperparathyroidism of renal origin: Secondary | ICD-10-CM | POA: Diagnosis not present

## 2021-08-17 DIAGNOSIS — Z992 Dependence on renal dialysis: Secondary | ICD-10-CM | POA: Diagnosis not present

## 2021-08-19 DIAGNOSIS — N2581 Secondary hyperparathyroidism of renal origin: Secondary | ICD-10-CM | POA: Diagnosis not present

## 2021-08-19 DIAGNOSIS — N186 End stage renal disease: Secondary | ICD-10-CM | POA: Diagnosis not present

## 2021-08-19 DIAGNOSIS — Z992 Dependence on renal dialysis: Secondary | ICD-10-CM | POA: Diagnosis not present

## 2021-08-21 DIAGNOSIS — Z992 Dependence on renal dialysis: Secondary | ICD-10-CM | POA: Diagnosis not present

## 2021-08-21 DIAGNOSIS — N186 End stage renal disease: Secondary | ICD-10-CM | POA: Diagnosis not present

## 2021-08-21 DIAGNOSIS — N2581 Secondary hyperparathyroidism of renal origin: Secondary | ICD-10-CM | POA: Diagnosis not present

## 2021-08-24 DIAGNOSIS — N186 End stage renal disease: Secondary | ICD-10-CM | POA: Diagnosis not present

## 2021-08-24 DIAGNOSIS — N2581 Secondary hyperparathyroidism of renal origin: Secondary | ICD-10-CM | POA: Diagnosis not present

## 2021-08-24 DIAGNOSIS — Z992 Dependence on renal dialysis: Secondary | ICD-10-CM | POA: Diagnosis not present

## 2021-08-25 IMAGING — CT CT MAXILLOFACIAL W/O CM
3 series · 16 of 47 positions shown, 19 images · non-contrast
Comparison: None.

CLINICAL DATA: Rule out tooth abscess. Tooth pain. Dialysis
patient.

EXAM:
CT MAXILLOFACIAL WITHOUT CONTRAST
TECHNIQUE: Multidetector CT imaging of the maxillofacial structures was
performed. Multiplanar CT image reconstructions were also generated.

[Series 3: max soft · axial · 0.38mm/px · z∈[-184,-18]mm · 10 of 97 slices shown, 13 images]
[im 7/97  brain]
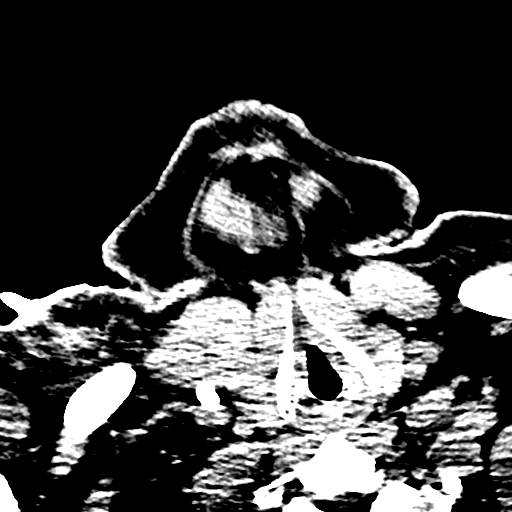
[im 7/97  bone]
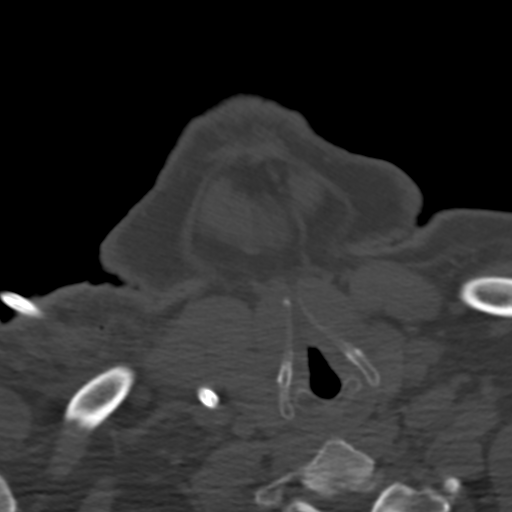
[im 17/97  bone]
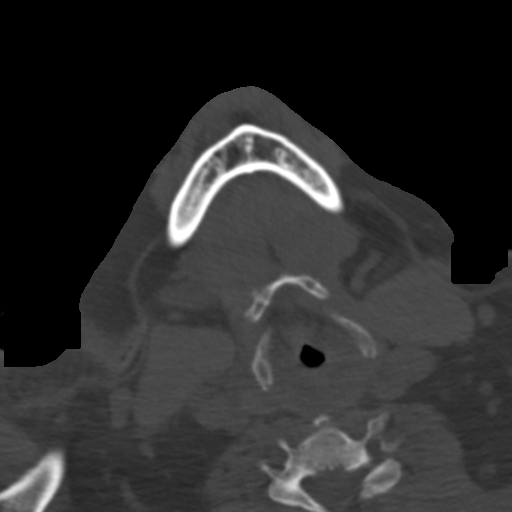
[im 27/97  bone]
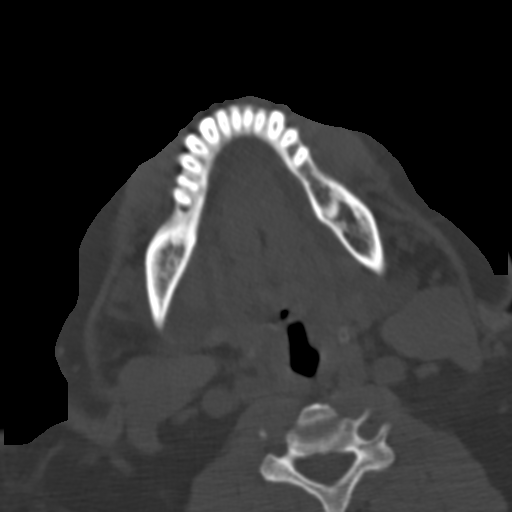
[im 34/97  bone]
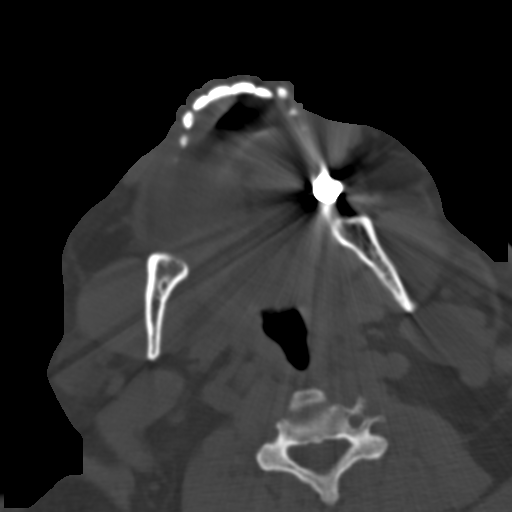
[im 44/97  brain]
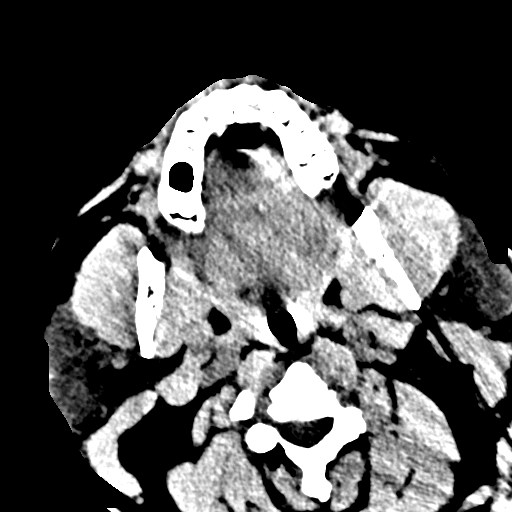
[im 44/97  bone]
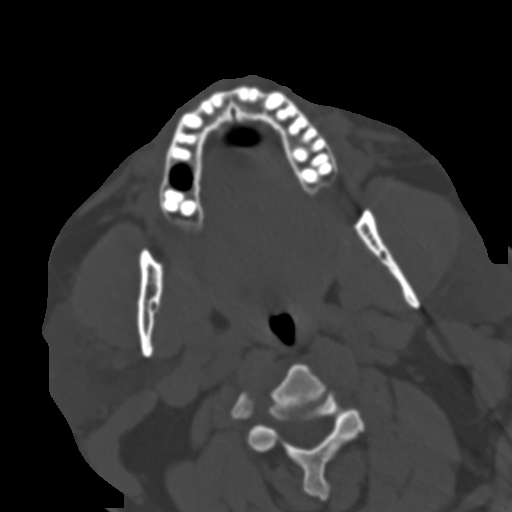
[im 53/97  bone]
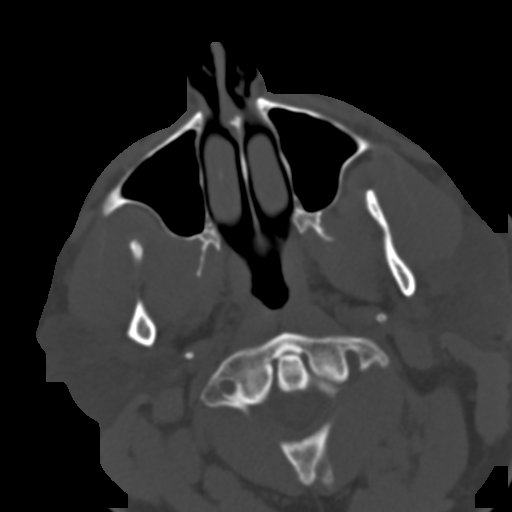
[im 63/97  bone]
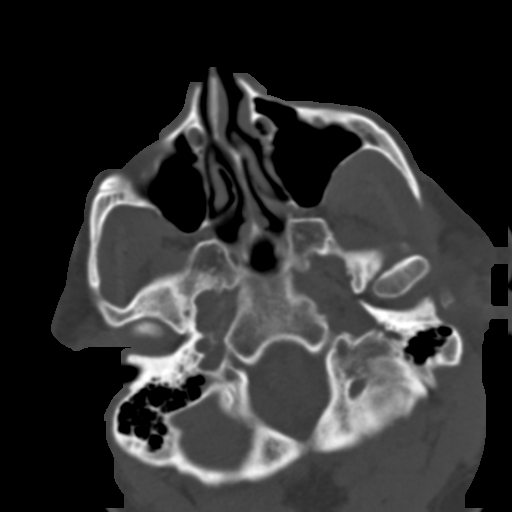
[im 73/97  bone]
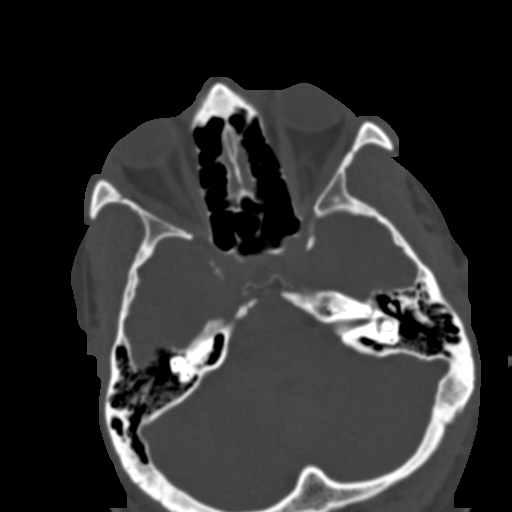
[im 80/97  brain]
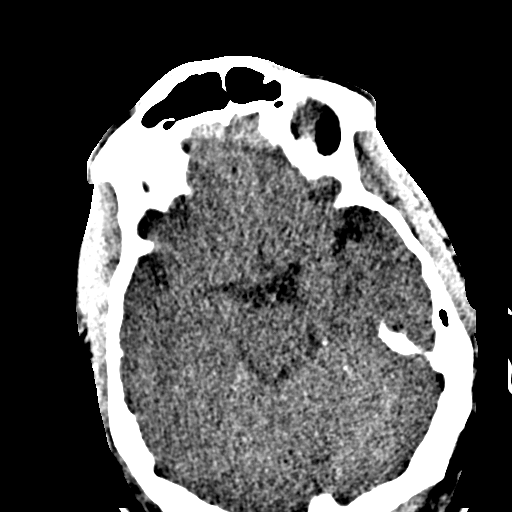
[im 80/97  bone]
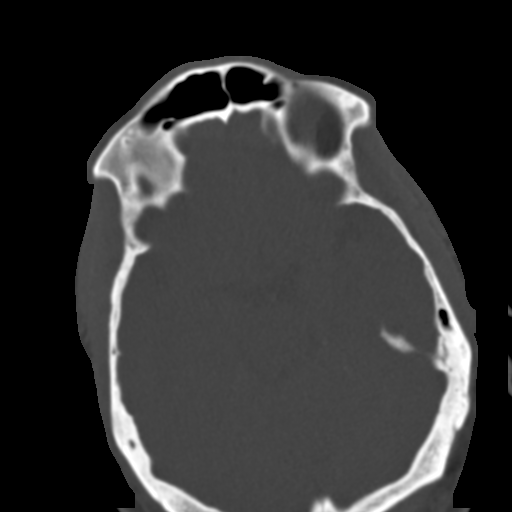
[im 90/97  bone]
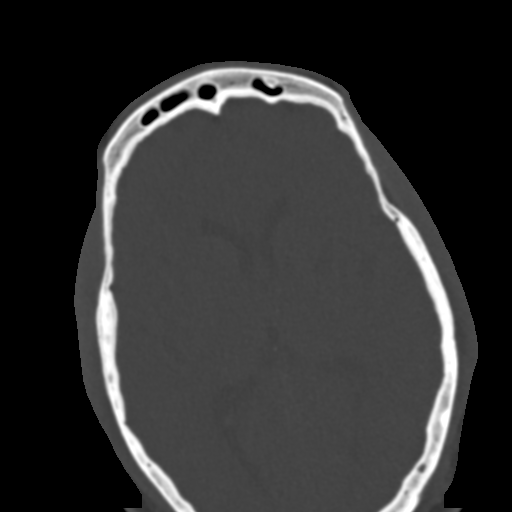

[Series 6: coronal soft · coronal · 0.48mm/px · 3 of 108 slices shown]
[im 36/108  bone]
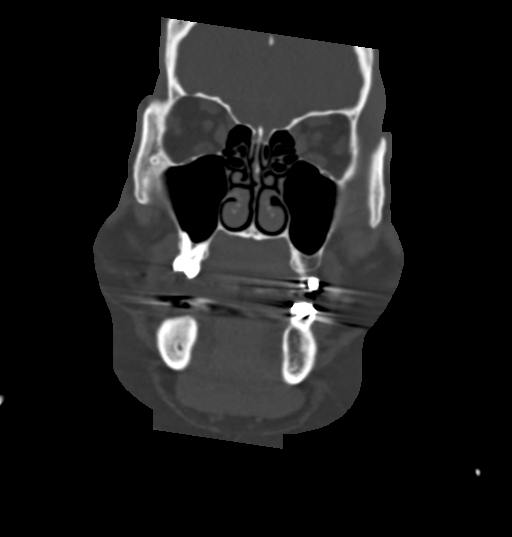
[im 48/108  bone]
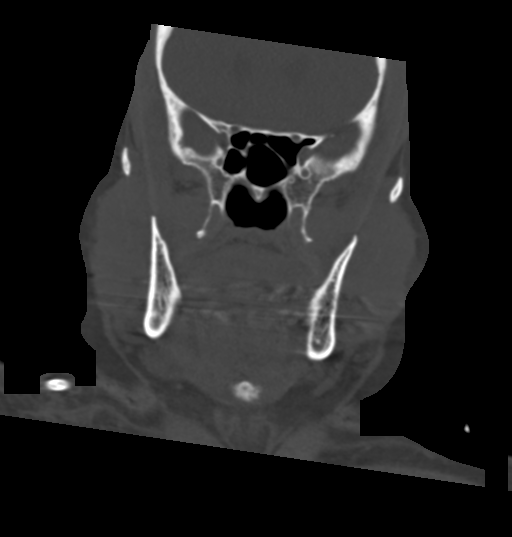
[im 60/108  bone]
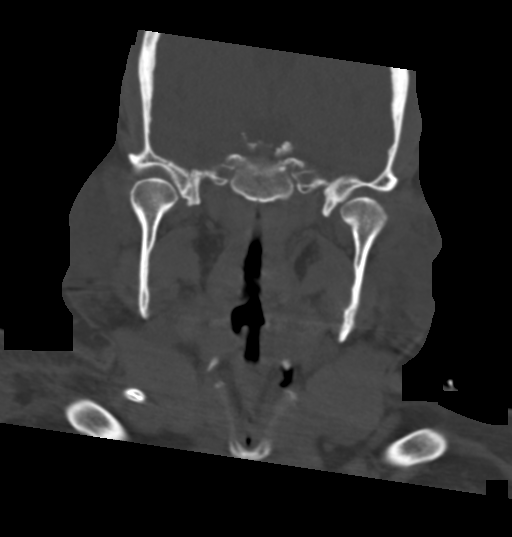

[Series 7: sagittal soft · sagittal · 0.45mm/px · 3 of 104 slices shown]
[im 40/104  bone]
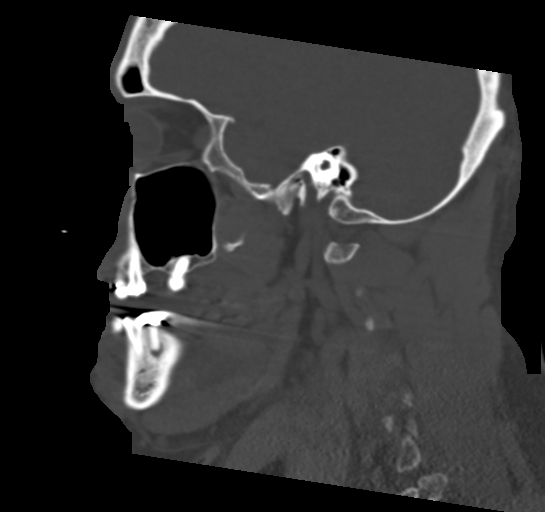
[im 52/104  bone]
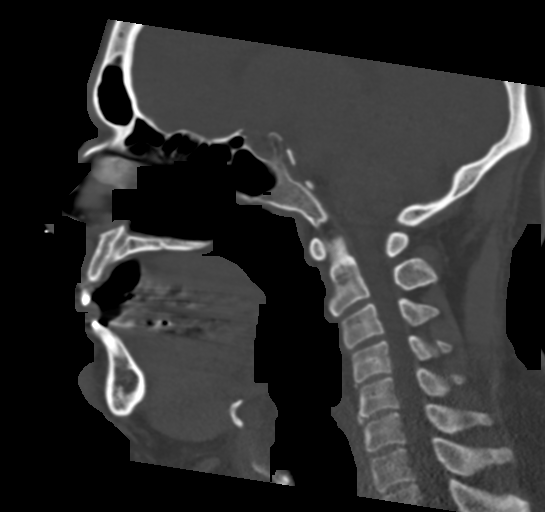
[im 64/104  bone]
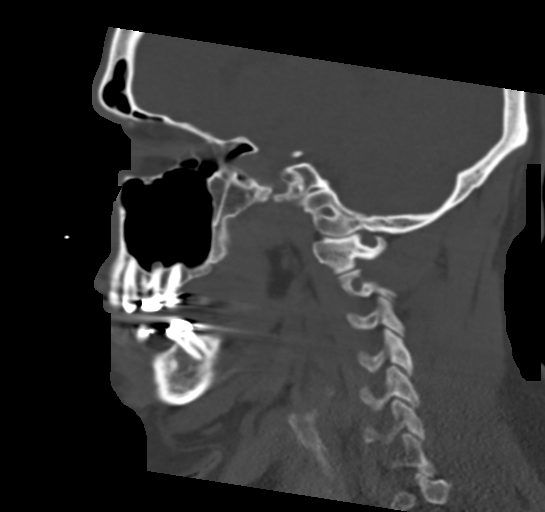

[16 of 47 positions shown; findings below may reference images not displayed]

FINDINGS: Osseous: Negative for fracture.

Periapical lucency around right lower molar compatible with dental
infection. No adjacent soft tissue swelling. No other dental lesion.

Orbits: Normal orbit.  Negative for soft tissue mass or edema.

Sinuses: Paranasal sinuses clear.  Mastoid clear bilaterally.

Soft tissues: No soft tissue swelling or soft tissue abscess
adjacent to the right lower molar periapical lucency.

Dialysis central venous catheter from a right jugular approach. Tip
not visualized. No hematoma or mass in the neck.

Limited intracranial: Negative for acute abnormality. Chronic
infarct left basal ganglia.
IMPRESSION: Periapical lucency around right lower molar compatible with dental
infection. No soft tissue edema or abscess identified.

## 2021-08-25 IMAGING — US US EXTREM LOW VENOUS
1 series · 13 of 24 positions shown · non-contrast
Comparison: None.

CLINICAL DATA: 49-year-old male with a history fever



[Series 1: us venous img lower bilat (dvt) · portal-venous · 13 of 68 slices shown]
[im 1/68]
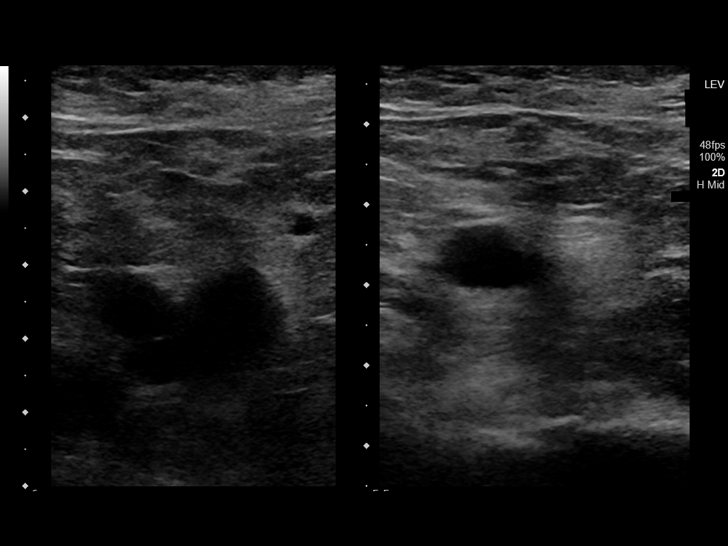
[im 6/68]
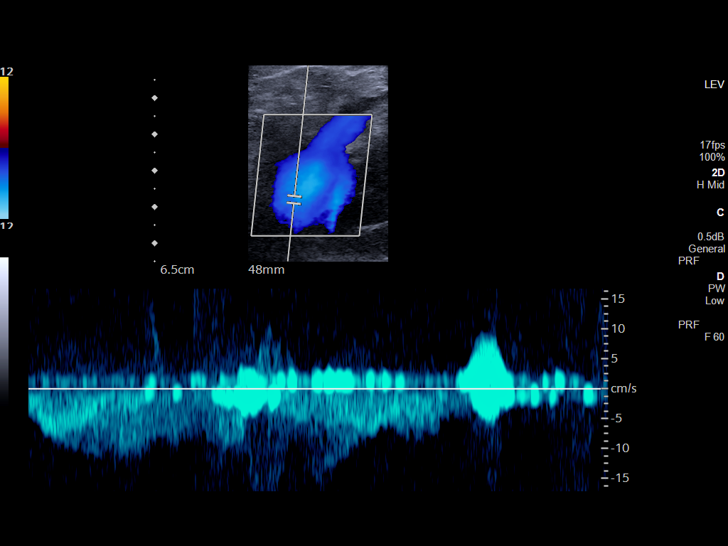
[im 12/68]
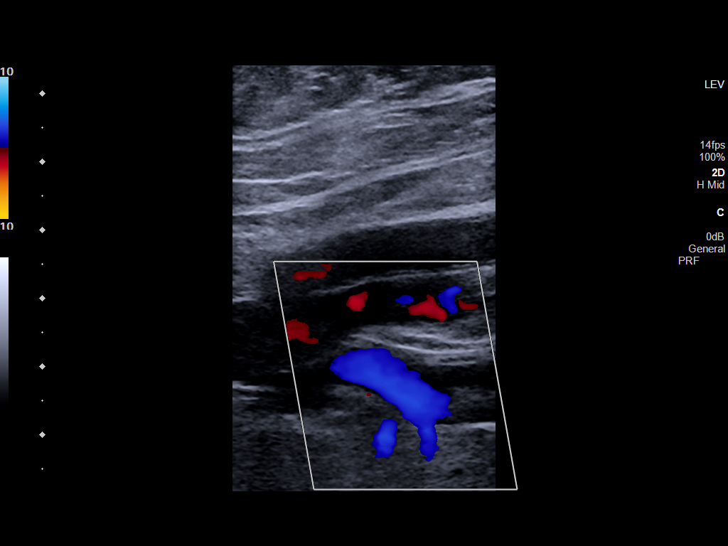
[im 18/68]
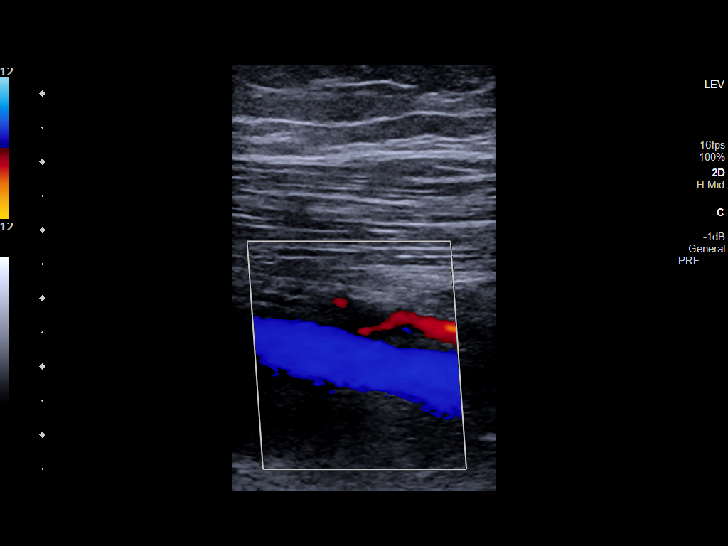
[im 24/68]
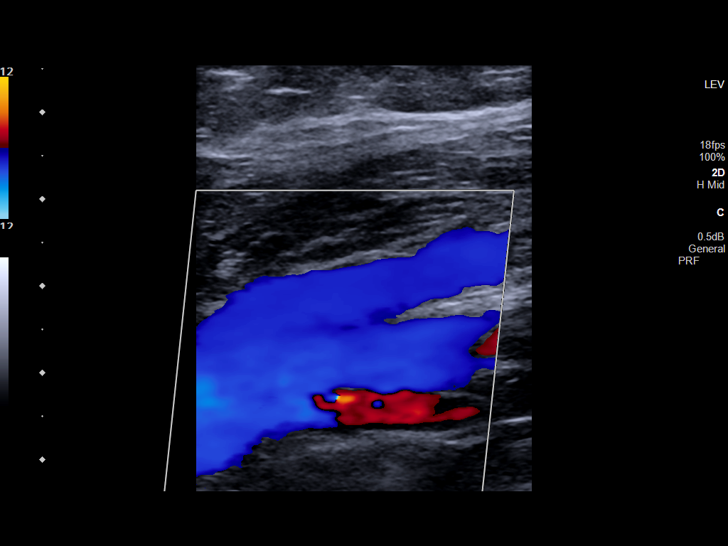
[im 30/68]
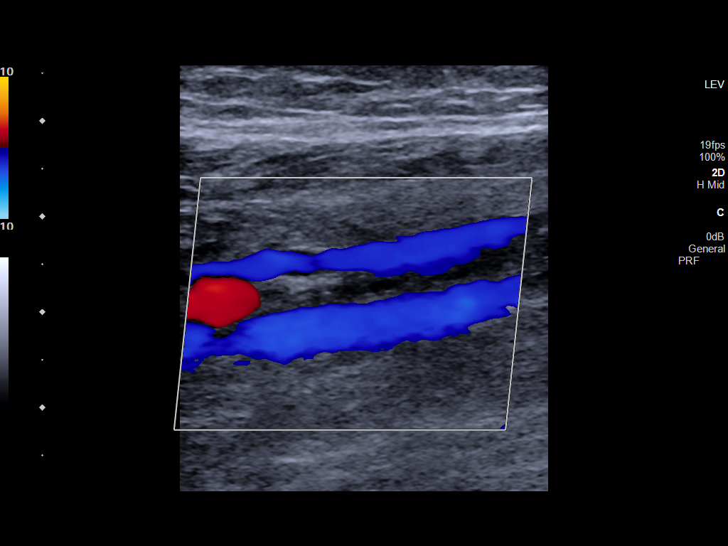
[im 35/68]
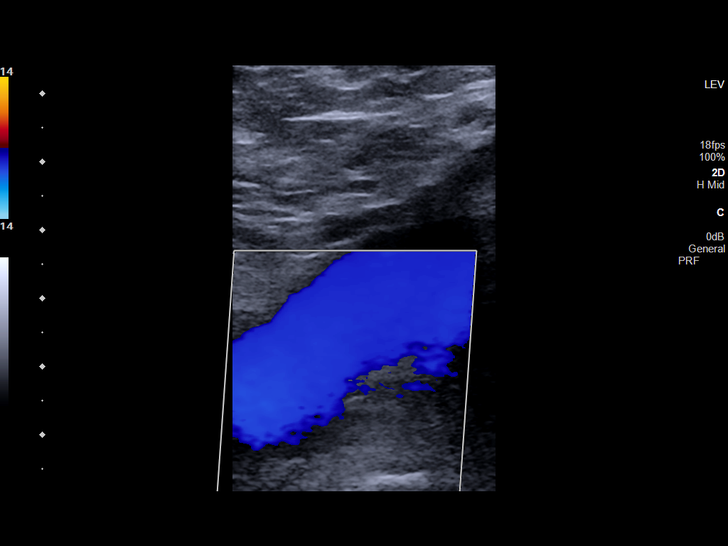
[im 38/68]
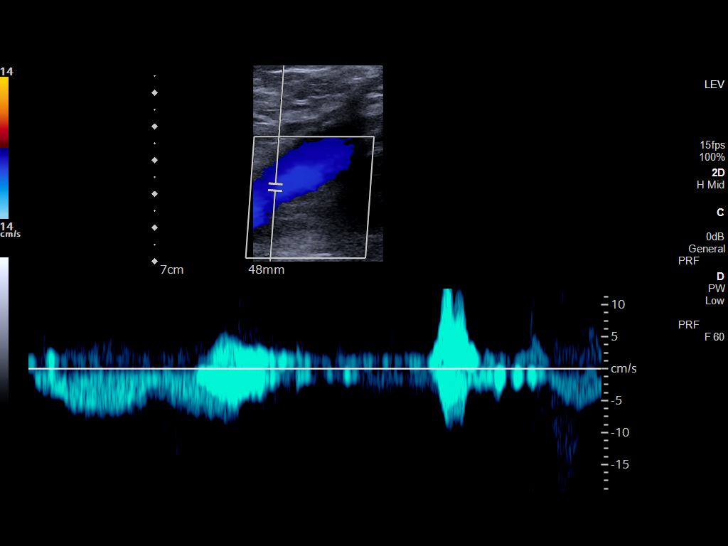
[im 44/68]
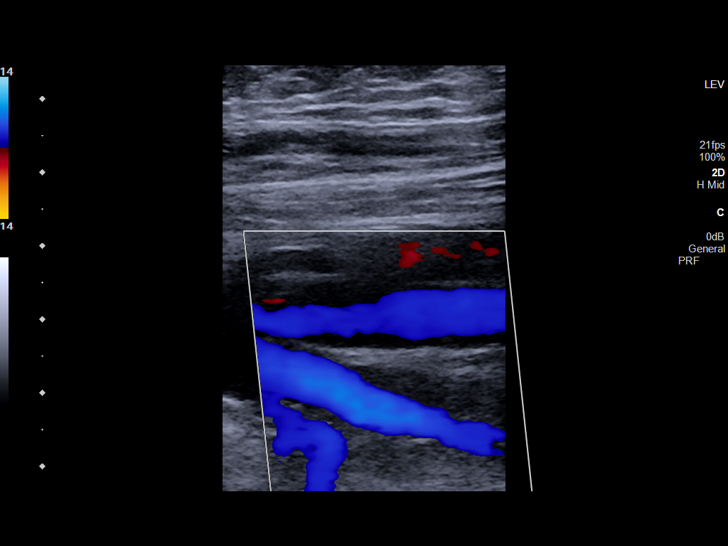
[im 50/68]
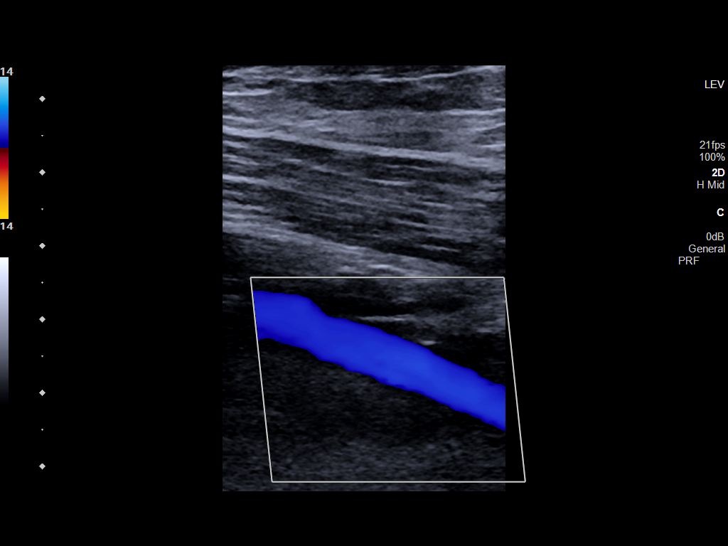
[im 56/68]
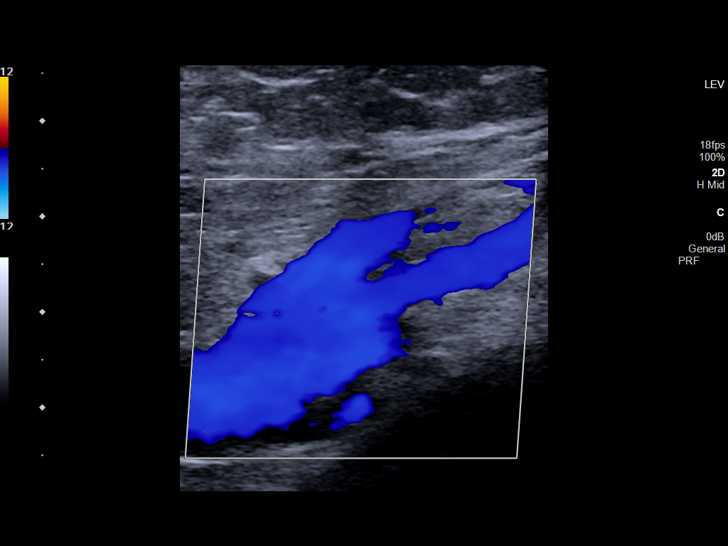
[im 62/68]
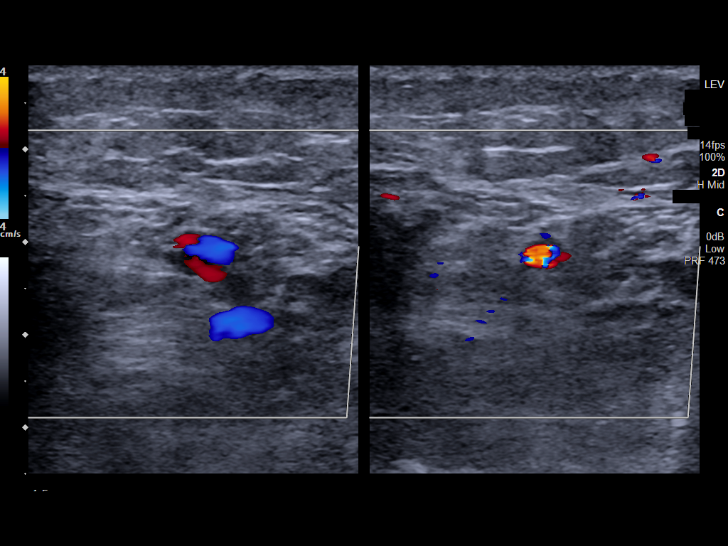
[im 68/68]
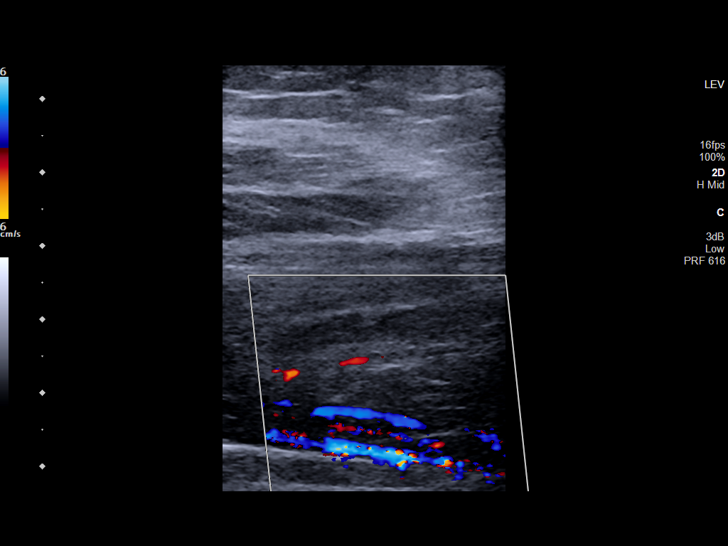

[13 of 24 positions shown; findings below may reference images not displayed]

FINDINGS: RIGHT LOWER EXTREMITY

Common Femoral Vein: No evidence of thrombus. Normal
compressibility, respiratory phasicity and response to augmentation.

Saphenofemoral Junction: No evidence of thrombus. Normal
compressibility and flow on color Doppler imaging.

Profunda Femoral Vein: No evidence of thrombus. Normal
compressibility and flow on color Doppler imaging.

Femoral Vein: No evidence of thrombus. Normal compressibility,
respiratory phasicity and response to augmentation.

Popliteal Vein: No evidence of thrombus. Normal compressibility,
respiratory phasicity and response to augmentation.

Calf Veins: No evidence of thrombus. Normal compressibility and flow
on color Doppler imaging.

Superficial Great Saphenous Vein: No evidence of thrombus. Normal
compressibility and flow on color Doppler imaging.

Other Findings:  None.

LEFT LOWER EXTREMITY

Common Femoral Vein: No evidence of thrombus. Normal
compressibility, respiratory phasicity and response to augmentation.

Saphenofemoral Junction: No evidence of thrombus. Normal
compressibility and flow on color Doppler imaging.

Profunda Femoral Vein: No evidence of thrombus. Normal
compressibility and flow on color Doppler imaging.

Femoral Vein: No evidence of thrombus. Normal compressibility,
respiratory phasicity and response to augmentation.

Popliteal Vein: No evidence of thrombus. Normal compressibility,
respiratory phasicity and response to augmentation.

Calf Veins: No evidence of thrombus. Normal compressibility and flow
on color Doppler imaging.

Superficial Great Saphenous Vein: No evidence of thrombus. Normal
compressibility and flow on color Doppler imaging.

Other Findings:  None.
IMPRESSION: Sonographic survey of the bilateral lower extremities negative for
DVT

## 2021-08-26 DIAGNOSIS — N186 End stage renal disease: Secondary | ICD-10-CM | POA: Diagnosis not present

## 2021-08-26 DIAGNOSIS — Z992 Dependence on renal dialysis: Secondary | ICD-10-CM | POA: Diagnosis not present

## 2021-08-26 DIAGNOSIS — N2581 Secondary hyperparathyroidism of renal origin: Secondary | ICD-10-CM | POA: Diagnosis not present

## 2021-08-28 DIAGNOSIS — N186 End stage renal disease: Secondary | ICD-10-CM | POA: Diagnosis not present

## 2021-08-28 DIAGNOSIS — Z992 Dependence on renal dialysis: Secondary | ICD-10-CM | POA: Diagnosis not present

## 2021-08-28 DIAGNOSIS — N2581 Secondary hyperparathyroidism of renal origin: Secondary | ICD-10-CM | POA: Diagnosis not present

## 2021-08-31 DIAGNOSIS — Z992 Dependence on renal dialysis: Secondary | ICD-10-CM | POA: Diagnosis not present

## 2021-08-31 DIAGNOSIS — N2581 Secondary hyperparathyroidism of renal origin: Secondary | ICD-10-CM | POA: Diagnosis not present

## 2021-08-31 DIAGNOSIS — N186 End stage renal disease: Secondary | ICD-10-CM | POA: Diagnosis not present

## 2021-09-02 DIAGNOSIS — N2581 Secondary hyperparathyroidism of renal origin: Secondary | ICD-10-CM | POA: Diagnosis not present

## 2021-09-02 DIAGNOSIS — Z992 Dependence on renal dialysis: Secondary | ICD-10-CM | POA: Diagnosis not present

## 2021-09-02 DIAGNOSIS — N186 End stage renal disease: Secondary | ICD-10-CM | POA: Diagnosis not present

## 2021-09-04 DIAGNOSIS — Z992 Dependence on renal dialysis: Secondary | ICD-10-CM | POA: Diagnosis not present

## 2021-09-04 DIAGNOSIS — N186 End stage renal disease: Secondary | ICD-10-CM | POA: Diagnosis not present

## 2021-09-04 DIAGNOSIS — N2581 Secondary hyperparathyroidism of renal origin: Secondary | ICD-10-CM | POA: Diagnosis not present

## 2021-09-07 DIAGNOSIS — Z992 Dependence on renal dialysis: Secondary | ICD-10-CM | POA: Diagnosis not present

## 2021-09-07 DIAGNOSIS — N186 End stage renal disease: Secondary | ICD-10-CM | POA: Diagnosis not present

## 2021-09-07 DIAGNOSIS — N2581 Secondary hyperparathyroidism of renal origin: Secondary | ICD-10-CM | POA: Diagnosis not present

## 2021-09-09 DIAGNOSIS — Z992 Dependence on renal dialysis: Secondary | ICD-10-CM | POA: Diagnosis not present

## 2021-09-09 DIAGNOSIS — N186 End stage renal disease: Secondary | ICD-10-CM | POA: Diagnosis not present

## 2021-09-09 DIAGNOSIS — N2581 Secondary hyperparathyroidism of renal origin: Secondary | ICD-10-CM | POA: Diagnosis not present

## 2021-09-11 DIAGNOSIS — N2581 Secondary hyperparathyroidism of renal origin: Secondary | ICD-10-CM | POA: Diagnosis not present

## 2021-09-11 DIAGNOSIS — N186 End stage renal disease: Secondary | ICD-10-CM | POA: Diagnosis not present

## 2021-09-11 DIAGNOSIS — Z992 Dependence on renal dialysis: Secondary | ICD-10-CM | POA: Diagnosis not present

## 2021-09-14 DIAGNOSIS — N2581 Secondary hyperparathyroidism of renal origin: Secondary | ICD-10-CM | POA: Diagnosis not present

## 2021-09-14 DIAGNOSIS — N186 End stage renal disease: Secondary | ICD-10-CM | POA: Diagnosis not present

## 2021-09-14 DIAGNOSIS — Z992 Dependence on renal dialysis: Secondary | ICD-10-CM | POA: Diagnosis not present

## 2021-09-16 DIAGNOSIS — Z992 Dependence on renal dialysis: Secondary | ICD-10-CM | POA: Diagnosis not present

## 2021-09-16 DIAGNOSIS — N2581 Secondary hyperparathyroidism of renal origin: Secondary | ICD-10-CM | POA: Diagnosis not present

## 2021-09-16 DIAGNOSIS — N186 End stage renal disease: Secondary | ICD-10-CM | POA: Diagnosis not present

## 2021-09-18 DIAGNOSIS — N186 End stage renal disease: Secondary | ICD-10-CM | POA: Diagnosis not present

## 2021-09-18 DIAGNOSIS — Z992 Dependence on renal dialysis: Secondary | ICD-10-CM | POA: Diagnosis not present

## 2021-09-18 DIAGNOSIS — N2581 Secondary hyperparathyroidism of renal origin: Secondary | ICD-10-CM | POA: Diagnosis not present

## 2021-09-21 DIAGNOSIS — Z992 Dependence on renal dialysis: Secondary | ICD-10-CM | POA: Diagnosis not present

## 2021-09-21 DIAGNOSIS — N2581 Secondary hyperparathyroidism of renal origin: Secondary | ICD-10-CM | POA: Diagnosis not present

## 2021-09-21 DIAGNOSIS — N186 End stage renal disease: Secondary | ICD-10-CM | POA: Diagnosis not present

## 2021-09-23 DIAGNOSIS — N2581 Secondary hyperparathyroidism of renal origin: Secondary | ICD-10-CM | POA: Diagnosis not present

## 2021-09-23 DIAGNOSIS — N186 End stage renal disease: Secondary | ICD-10-CM | POA: Diagnosis not present

## 2021-09-23 DIAGNOSIS — Z992 Dependence on renal dialysis: Secondary | ICD-10-CM | POA: Diagnosis not present

## 2021-09-24 DIAGNOSIS — Z0001 Encounter for general adult medical examination with abnormal findings: Secondary | ICD-10-CM | POA: Diagnosis not present

## 2021-09-24 DIAGNOSIS — I10 Essential (primary) hypertension: Secondary | ICD-10-CM | POA: Diagnosis not present

## 2021-09-25 DIAGNOSIS — N186 End stage renal disease: Secondary | ICD-10-CM | POA: Diagnosis not present

## 2021-09-25 DIAGNOSIS — N2581 Secondary hyperparathyroidism of renal origin: Secondary | ICD-10-CM | POA: Diagnosis not present

## 2021-09-25 DIAGNOSIS — Z992 Dependence on renal dialysis: Secondary | ICD-10-CM | POA: Diagnosis not present

## 2021-09-27 DIAGNOSIS — N186 End stage renal disease: Secondary | ICD-10-CM | POA: Diagnosis not present

## 2021-09-27 DIAGNOSIS — I10 Essential (primary) hypertension: Secondary | ICD-10-CM | POA: Diagnosis not present

## 2021-09-27 DIAGNOSIS — E782 Mixed hyperlipidemia: Secondary | ICD-10-CM | POA: Diagnosis not present

## 2021-09-27 DIAGNOSIS — F840 Autistic disorder: Secondary | ICD-10-CM | POA: Diagnosis not present

## 2021-09-27 DIAGNOSIS — E559 Vitamin D deficiency, unspecified: Secondary | ICD-10-CM | POA: Diagnosis not present

## 2021-09-28 DIAGNOSIS — N186 End stage renal disease: Secondary | ICD-10-CM | POA: Diagnosis not present

## 2021-09-28 DIAGNOSIS — Z992 Dependence on renal dialysis: Secondary | ICD-10-CM | POA: Diagnosis not present

## 2021-09-28 DIAGNOSIS — N2581 Secondary hyperparathyroidism of renal origin: Secondary | ICD-10-CM | POA: Diagnosis not present

## 2021-09-30 DIAGNOSIS — Z992 Dependence on renal dialysis: Secondary | ICD-10-CM | POA: Diagnosis not present

## 2021-09-30 DIAGNOSIS — N186 End stage renal disease: Secondary | ICD-10-CM | POA: Diagnosis not present

## 2021-09-30 DIAGNOSIS — N2581 Secondary hyperparathyroidism of renal origin: Secondary | ICD-10-CM | POA: Diagnosis not present

## 2021-10-02 DIAGNOSIS — N2581 Secondary hyperparathyroidism of renal origin: Secondary | ICD-10-CM | POA: Diagnosis not present

## 2021-10-02 DIAGNOSIS — N186 End stage renal disease: Secondary | ICD-10-CM | POA: Diagnosis not present

## 2021-10-02 DIAGNOSIS — Z992 Dependence on renal dialysis: Secondary | ICD-10-CM | POA: Diagnosis not present

## 2021-10-05 DIAGNOSIS — N2581 Secondary hyperparathyroidism of renal origin: Secondary | ICD-10-CM | POA: Diagnosis not present

## 2021-10-05 DIAGNOSIS — N186 End stage renal disease: Secondary | ICD-10-CM | POA: Diagnosis not present

## 2021-10-05 DIAGNOSIS — Z992 Dependence on renal dialysis: Secondary | ICD-10-CM | POA: Diagnosis not present

## 2021-10-07 DIAGNOSIS — N186 End stage renal disease: Secondary | ICD-10-CM | POA: Diagnosis not present

## 2021-10-07 DIAGNOSIS — N2581 Secondary hyperparathyroidism of renal origin: Secondary | ICD-10-CM | POA: Diagnosis not present

## 2021-10-07 DIAGNOSIS — Z992 Dependence on renal dialysis: Secondary | ICD-10-CM | POA: Diagnosis not present

## 2021-10-09 DIAGNOSIS — N2581 Secondary hyperparathyroidism of renal origin: Secondary | ICD-10-CM | POA: Diagnosis not present

## 2021-10-09 DIAGNOSIS — N186 End stage renal disease: Secondary | ICD-10-CM | POA: Diagnosis not present

## 2021-10-09 DIAGNOSIS — Z992 Dependence on renal dialysis: Secondary | ICD-10-CM | POA: Diagnosis not present

## 2021-10-12 DIAGNOSIS — N186 End stage renal disease: Secondary | ICD-10-CM | POA: Diagnosis not present

## 2021-10-12 DIAGNOSIS — N2581 Secondary hyperparathyroidism of renal origin: Secondary | ICD-10-CM | POA: Diagnosis not present

## 2021-10-12 DIAGNOSIS — Z992 Dependence on renal dialysis: Secondary | ICD-10-CM | POA: Diagnosis not present

## 2021-10-14 DIAGNOSIS — N186 End stage renal disease: Secondary | ICD-10-CM | POA: Diagnosis not present

## 2021-10-14 DIAGNOSIS — N2581 Secondary hyperparathyroidism of renal origin: Secondary | ICD-10-CM | POA: Diagnosis not present

## 2021-10-14 DIAGNOSIS — Z992 Dependence on renal dialysis: Secondary | ICD-10-CM | POA: Diagnosis not present

## 2021-10-16 DIAGNOSIS — N2581 Secondary hyperparathyroidism of renal origin: Secondary | ICD-10-CM | POA: Diagnosis not present

## 2021-10-16 DIAGNOSIS — N186 End stage renal disease: Secondary | ICD-10-CM | POA: Diagnosis not present

## 2021-10-16 DIAGNOSIS — Z992 Dependence on renal dialysis: Secondary | ICD-10-CM | POA: Diagnosis not present

## 2021-10-19 DIAGNOSIS — Z992 Dependence on renal dialysis: Secondary | ICD-10-CM | POA: Diagnosis not present

## 2021-10-19 DIAGNOSIS — N2581 Secondary hyperparathyroidism of renal origin: Secondary | ICD-10-CM | POA: Diagnosis not present

## 2021-10-19 DIAGNOSIS — N186 End stage renal disease: Secondary | ICD-10-CM | POA: Diagnosis not present

## 2021-10-21 DIAGNOSIS — Z992 Dependence on renal dialysis: Secondary | ICD-10-CM | POA: Diagnosis not present

## 2021-10-21 DIAGNOSIS — N2581 Secondary hyperparathyroidism of renal origin: Secondary | ICD-10-CM | POA: Diagnosis not present

## 2021-10-21 DIAGNOSIS — N186 End stage renal disease: Secondary | ICD-10-CM | POA: Diagnosis not present

## 2021-10-23 DIAGNOSIS — N2581 Secondary hyperparathyroidism of renal origin: Secondary | ICD-10-CM | POA: Diagnosis not present

## 2021-10-23 DIAGNOSIS — Z992 Dependence on renal dialysis: Secondary | ICD-10-CM | POA: Diagnosis not present

## 2021-10-23 DIAGNOSIS — N186 End stage renal disease: Secondary | ICD-10-CM | POA: Diagnosis not present

## 2021-10-26 DIAGNOSIS — Z992 Dependence on renal dialysis: Secondary | ICD-10-CM | POA: Diagnosis not present

## 2021-10-26 DIAGNOSIS — N186 End stage renal disease: Secondary | ICD-10-CM | POA: Diagnosis not present

## 2021-10-26 DIAGNOSIS — N2581 Secondary hyperparathyroidism of renal origin: Secondary | ICD-10-CM | POA: Diagnosis not present

## 2021-10-28 DIAGNOSIS — N2581 Secondary hyperparathyroidism of renal origin: Secondary | ICD-10-CM | POA: Diagnosis not present

## 2021-10-28 DIAGNOSIS — N186 End stage renal disease: Secondary | ICD-10-CM | POA: Diagnosis not present

## 2021-10-28 DIAGNOSIS — Z992 Dependence on renal dialysis: Secondary | ICD-10-CM | POA: Diagnosis not present

## 2021-10-29 DIAGNOSIS — E782 Mixed hyperlipidemia: Secondary | ICD-10-CM | POA: Diagnosis not present

## 2021-10-29 DIAGNOSIS — E559 Vitamin D deficiency, unspecified: Secondary | ICD-10-CM | POA: Diagnosis not present

## 2021-10-29 DIAGNOSIS — I10 Essential (primary) hypertension: Secondary | ICD-10-CM | POA: Diagnosis not present

## 2021-10-30 DIAGNOSIS — Z992 Dependence on renal dialysis: Secondary | ICD-10-CM | POA: Diagnosis not present

## 2021-10-30 DIAGNOSIS — N186 End stage renal disease: Secondary | ICD-10-CM | POA: Diagnosis not present

## 2021-10-30 DIAGNOSIS — N2581 Secondary hyperparathyroidism of renal origin: Secondary | ICD-10-CM | POA: Diagnosis not present

## 2021-11-02 DIAGNOSIS — N2581 Secondary hyperparathyroidism of renal origin: Secondary | ICD-10-CM | POA: Diagnosis not present

## 2021-11-02 DIAGNOSIS — N186 End stage renal disease: Secondary | ICD-10-CM | POA: Diagnosis not present

## 2021-11-02 DIAGNOSIS — Z992 Dependence on renal dialysis: Secondary | ICD-10-CM | POA: Diagnosis not present

## 2021-11-04 DIAGNOSIS — N186 End stage renal disease: Secondary | ICD-10-CM | POA: Diagnosis not present

## 2021-11-04 DIAGNOSIS — Z992 Dependence on renal dialysis: Secondary | ICD-10-CM | POA: Diagnosis not present

## 2021-11-04 DIAGNOSIS — N2581 Secondary hyperparathyroidism of renal origin: Secondary | ICD-10-CM | POA: Diagnosis not present

## 2021-11-06 DIAGNOSIS — N186 End stage renal disease: Secondary | ICD-10-CM | POA: Diagnosis not present

## 2021-11-06 DIAGNOSIS — N2581 Secondary hyperparathyroidism of renal origin: Secondary | ICD-10-CM | POA: Diagnosis not present

## 2021-11-06 DIAGNOSIS — Z992 Dependence on renal dialysis: Secondary | ICD-10-CM | POA: Diagnosis not present

## 2021-11-09 DIAGNOSIS — N186 End stage renal disease: Secondary | ICD-10-CM | POA: Diagnosis not present

## 2021-11-09 DIAGNOSIS — Z992 Dependence on renal dialysis: Secondary | ICD-10-CM | POA: Diagnosis not present

## 2021-11-09 DIAGNOSIS — N2581 Secondary hyperparathyroidism of renal origin: Secondary | ICD-10-CM | POA: Diagnosis not present

## 2021-11-11 DIAGNOSIS — N2581 Secondary hyperparathyroidism of renal origin: Secondary | ICD-10-CM | POA: Diagnosis not present

## 2021-11-11 DIAGNOSIS — Z992 Dependence on renal dialysis: Secondary | ICD-10-CM | POA: Diagnosis not present

## 2021-11-11 DIAGNOSIS — N186 End stage renal disease: Secondary | ICD-10-CM | POA: Diagnosis not present

## 2021-11-13 DIAGNOSIS — N2581 Secondary hyperparathyroidism of renal origin: Secondary | ICD-10-CM | POA: Diagnosis not present

## 2021-11-13 DIAGNOSIS — N186 End stage renal disease: Secondary | ICD-10-CM | POA: Diagnosis not present

## 2021-11-13 DIAGNOSIS — Z992 Dependence on renal dialysis: Secondary | ICD-10-CM | POA: Diagnosis not present

## 2021-11-16 DIAGNOSIS — N2581 Secondary hyperparathyroidism of renal origin: Secondary | ICD-10-CM | POA: Diagnosis not present

## 2021-11-16 DIAGNOSIS — N186 End stage renal disease: Secondary | ICD-10-CM | POA: Diagnosis not present

## 2021-11-16 DIAGNOSIS — Z992 Dependence on renal dialysis: Secondary | ICD-10-CM | POA: Diagnosis not present

## 2021-11-18 DIAGNOSIS — N186 End stage renal disease: Secondary | ICD-10-CM | POA: Diagnosis not present

## 2021-11-18 DIAGNOSIS — N2581 Secondary hyperparathyroidism of renal origin: Secondary | ICD-10-CM | POA: Diagnosis not present

## 2021-11-18 DIAGNOSIS — Z992 Dependence on renal dialysis: Secondary | ICD-10-CM | POA: Diagnosis not present

## 2021-11-20 DIAGNOSIS — Z992 Dependence on renal dialysis: Secondary | ICD-10-CM | POA: Diagnosis not present

## 2021-11-20 DIAGNOSIS — N186 End stage renal disease: Secondary | ICD-10-CM | POA: Diagnosis not present

## 2021-11-20 DIAGNOSIS — N2581 Secondary hyperparathyroidism of renal origin: Secondary | ICD-10-CM | POA: Diagnosis not present

## 2021-11-23 DIAGNOSIS — N186 End stage renal disease: Secondary | ICD-10-CM | POA: Diagnosis not present

## 2021-11-23 DIAGNOSIS — Z992 Dependence on renal dialysis: Secondary | ICD-10-CM | POA: Diagnosis not present

## 2021-11-23 DIAGNOSIS — N2581 Secondary hyperparathyroidism of renal origin: Secondary | ICD-10-CM | POA: Diagnosis not present

## 2021-11-25 DIAGNOSIS — Z992 Dependence on renal dialysis: Secondary | ICD-10-CM | POA: Diagnosis not present

## 2021-11-25 DIAGNOSIS — N2581 Secondary hyperparathyroidism of renal origin: Secondary | ICD-10-CM | POA: Diagnosis not present

## 2021-11-25 DIAGNOSIS — N186 End stage renal disease: Secondary | ICD-10-CM | POA: Diagnosis not present

## 2021-11-26 DIAGNOSIS — Z992 Dependence on renal dialysis: Secondary | ICD-10-CM | POA: Diagnosis not present

## 2021-11-26 DIAGNOSIS — N186 End stage renal disease: Secondary | ICD-10-CM | POA: Diagnosis not present

## 2021-11-27 DIAGNOSIS — N2581 Secondary hyperparathyroidism of renal origin: Secondary | ICD-10-CM | POA: Diagnosis not present

## 2021-11-27 DIAGNOSIS — Z992 Dependence on renal dialysis: Secondary | ICD-10-CM | POA: Diagnosis not present

## 2021-11-27 DIAGNOSIS — N186 End stage renal disease: Secondary | ICD-10-CM | POA: Diagnosis not present

## 2021-11-30 DIAGNOSIS — N2581 Secondary hyperparathyroidism of renal origin: Secondary | ICD-10-CM | POA: Diagnosis not present

## 2021-11-30 DIAGNOSIS — Z992 Dependence on renal dialysis: Secondary | ICD-10-CM | POA: Diagnosis not present

## 2021-11-30 DIAGNOSIS — N186 End stage renal disease: Secondary | ICD-10-CM | POA: Diagnosis not present

## 2021-12-02 DIAGNOSIS — N186 End stage renal disease: Secondary | ICD-10-CM | POA: Diagnosis not present

## 2021-12-02 DIAGNOSIS — N2581 Secondary hyperparathyroidism of renal origin: Secondary | ICD-10-CM | POA: Diagnosis not present

## 2021-12-02 DIAGNOSIS — Z992 Dependence on renal dialysis: Secondary | ICD-10-CM | POA: Diagnosis not present

## 2021-12-04 DIAGNOSIS — N2581 Secondary hyperparathyroidism of renal origin: Secondary | ICD-10-CM | POA: Diagnosis not present

## 2021-12-04 DIAGNOSIS — Z992 Dependence on renal dialysis: Secondary | ICD-10-CM | POA: Diagnosis not present

## 2021-12-04 DIAGNOSIS — N186 End stage renal disease: Secondary | ICD-10-CM | POA: Diagnosis not present

## 2021-12-07 DIAGNOSIS — N186 End stage renal disease: Secondary | ICD-10-CM | POA: Diagnosis not present

## 2021-12-07 DIAGNOSIS — Z992 Dependence on renal dialysis: Secondary | ICD-10-CM | POA: Diagnosis not present

## 2021-12-07 DIAGNOSIS — N2581 Secondary hyperparathyroidism of renal origin: Secondary | ICD-10-CM | POA: Diagnosis not present

## 2021-12-09 DIAGNOSIS — Z992 Dependence on renal dialysis: Secondary | ICD-10-CM | POA: Diagnosis not present

## 2021-12-09 DIAGNOSIS — N186 End stage renal disease: Secondary | ICD-10-CM | POA: Diagnosis not present

## 2021-12-09 DIAGNOSIS — N2581 Secondary hyperparathyroidism of renal origin: Secondary | ICD-10-CM | POA: Diagnosis not present

## 2021-12-11 DIAGNOSIS — N2581 Secondary hyperparathyroidism of renal origin: Secondary | ICD-10-CM | POA: Diagnosis not present

## 2021-12-11 DIAGNOSIS — N186 End stage renal disease: Secondary | ICD-10-CM | POA: Diagnosis not present

## 2021-12-11 DIAGNOSIS — Z992 Dependence on renal dialysis: Secondary | ICD-10-CM | POA: Diagnosis not present

## 2021-12-14 DIAGNOSIS — Z992 Dependence on renal dialysis: Secondary | ICD-10-CM | POA: Diagnosis not present

## 2021-12-14 DIAGNOSIS — N186 End stage renal disease: Secondary | ICD-10-CM | POA: Diagnosis not present

## 2021-12-14 DIAGNOSIS — N2581 Secondary hyperparathyroidism of renal origin: Secondary | ICD-10-CM | POA: Diagnosis not present

## 2021-12-16 DIAGNOSIS — N186 End stage renal disease: Secondary | ICD-10-CM | POA: Diagnosis not present

## 2021-12-16 DIAGNOSIS — Z992 Dependence on renal dialysis: Secondary | ICD-10-CM | POA: Diagnosis not present

## 2021-12-16 DIAGNOSIS — N2581 Secondary hyperparathyroidism of renal origin: Secondary | ICD-10-CM | POA: Diagnosis not present

## 2021-12-18 DIAGNOSIS — Z992 Dependence on renal dialysis: Secondary | ICD-10-CM | POA: Diagnosis not present

## 2021-12-18 DIAGNOSIS — N2581 Secondary hyperparathyroidism of renal origin: Secondary | ICD-10-CM | POA: Diagnosis not present

## 2021-12-18 DIAGNOSIS — N186 End stage renal disease: Secondary | ICD-10-CM | POA: Diagnosis not present

## 2021-12-21 DIAGNOSIS — Z992 Dependence on renal dialysis: Secondary | ICD-10-CM | POA: Diagnosis not present

## 2021-12-21 DIAGNOSIS — N2581 Secondary hyperparathyroidism of renal origin: Secondary | ICD-10-CM | POA: Diagnosis not present

## 2021-12-21 DIAGNOSIS — N186 End stage renal disease: Secondary | ICD-10-CM | POA: Diagnosis not present

## 2021-12-23 DIAGNOSIS — Z992 Dependence on renal dialysis: Secondary | ICD-10-CM | POA: Diagnosis not present

## 2021-12-23 DIAGNOSIS — N186 End stage renal disease: Secondary | ICD-10-CM | POA: Diagnosis not present

## 2021-12-23 DIAGNOSIS — N2581 Secondary hyperparathyroidism of renal origin: Secondary | ICD-10-CM | POA: Diagnosis not present

## 2021-12-25 DIAGNOSIS — Z992 Dependence on renal dialysis: Secondary | ICD-10-CM | POA: Diagnosis not present

## 2021-12-25 DIAGNOSIS — N2581 Secondary hyperparathyroidism of renal origin: Secondary | ICD-10-CM | POA: Diagnosis not present

## 2021-12-25 DIAGNOSIS — N186 End stage renal disease: Secondary | ICD-10-CM | POA: Diagnosis not present

## 2021-12-26 DIAGNOSIS — Z992 Dependence on renal dialysis: Secondary | ICD-10-CM | POA: Diagnosis not present

## 2021-12-26 DIAGNOSIS — N186 End stage renal disease: Secondary | ICD-10-CM | POA: Diagnosis not present

## 2021-12-28 DIAGNOSIS — Z992 Dependence on renal dialysis: Secondary | ICD-10-CM | POA: Diagnosis not present

## 2021-12-28 DIAGNOSIS — N186 End stage renal disease: Secondary | ICD-10-CM | POA: Diagnosis not present

## 2021-12-28 DIAGNOSIS — N2581 Secondary hyperparathyroidism of renal origin: Secondary | ICD-10-CM | POA: Diagnosis not present

## 2021-12-30 DIAGNOSIS — Z992 Dependence on renal dialysis: Secondary | ICD-10-CM | POA: Diagnosis not present

## 2021-12-30 DIAGNOSIS — N2581 Secondary hyperparathyroidism of renal origin: Secondary | ICD-10-CM | POA: Diagnosis not present

## 2021-12-30 DIAGNOSIS — N186 End stage renal disease: Secondary | ICD-10-CM | POA: Diagnosis not present

## 2022-01-01 DIAGNOSIS — Z992 Dependence on renal dialysis: Secondary | ICD-10-CM | POA: Diagnosis not present

## 2022-01-01 DIAGNOSIS — N186 End stage renal disease: Secondary | ICD-10-CM | POA: Diagnosis not present

## 2022-01-01 DIAGNOSIS — N2581 Secondary hyperparathyroidism of renal origin: Secondary | ICD-10-CM | POA: Diagnosis not present

## 2022-01-04 DIAGNOSIS — Z992 Dependence on renal dialysis: Secondary | ICD-10-CM | POA: Diagnosis not present

## 2022-01-04 DIAGNOSIS — N186 End stage renal disease: Secondary | ICD-10-CM | POA: Diagnosis not present

## 2022-01-04 DIAGNOSIS — N2581 Secondary hyperparathyroidism of renal origin: Secondary | ICD-10-CM | POA: Diagnosis not present

## 2022-01-06 DIAGNOSIS — Z992 Dependence on renal dialysis: Secondary | ICD-10-CM | POA: Diagnosis not present

## 2022-01-06 DIAGNOSIS — N186 End stage renal disease: Secondary | ICD-10-CM | POA: Diagnosis not present

## 2022-01-06 DIAGNOSIS — N2581 Secondary hyperparathyroidism of renal origin: Secondary | ICD-10-CM | POA: Diagnosis not present

## 2022-01-07 ENCOUNTER — Ambulatory Visit (INDEPENDENT_AMBULATORY_CARE_PROVIDER_SITE_OTHER): Payer: Medicare HMO | Admitting: Nurse Practitioner

## 2022-01-07 ENCOUNTER — Ambulatory Visit (INDEPENDENT_AMBULATORY_CARE_PROVIDER_SITE_OTHER): Payer: Medicare HMO

## 2022-01-07 ENCOUNTER — Encounter (INDEPENDENT_AMBULATORY_CARE_PROVIDER_SITE_OTHER): Payer: Self-pay | Admitting: Nurse Practitioner

## 2022-01-07 VITALS — BP 100/68 | HR 87 | Resp 16 | Wt 248.0 lb

## 2022-01-07 DIAGNOSIS — Z992 Dependence on renal dialysis: Secondary | ICD-10-CM

## 2022-01-07 DIAGNOSIS — N186 End stage renal disease: Secondary | ICD-10-CM

## 2022-01-07 DIAGNOSIS — I10 Essential (primary) hypertension: Secondary | ICD-10-CM

## 2022-01-07 NOTE — Progress Notes (Signed)
? ?Subjective:  ? ? Patient ID: Joseph Gilmore, male    DOB: 02/27/1971, 51 y.o.   MRN: [id number] ?Chief Complaint  ?Patient presents with  ? Follow-up  ?  Ref [place] HDA consult decreased flow  ? ? ?The patient returns to the office for follow up regarding a problem with their dialysis access.  ? ?The patient has been sent by his dialysis center due to concern for decreased access flow. ?The patient denies redness or swelling at the access site. The patient denies fever or chills at home or while on dialysis. ? ?No recent shortening of the patient's walking distance or new symptoms consistent with claudication.  No history of rest pain symptoms. No new ulcers or wounds of the lower extremities have occurred. ? ?The patient denies amaurosis fugax or recent TIA symptoms. There are no recent neurological changes noted. ?There is no history of DVT, PE or superficial thrombophlebitis. ?No recent episodes of angina or shortness of breath documented.  ? ?Duplex ultrasound of the AV access shows a patent access.  The previously noted stenosis is significantly increased compared to last study.  Flow volume today is 913 cc/min (previous flow volume was 100 cc/min)  ? ? ?Review of Systems  ?All other systems reviewed and are negative. ? ?   ?Objective:  ? Physical Exam ?Vitals reviewed.  ?HENT:  ?   Head: Normocephalic.  ?Cardiovascular:  ?   Rate and Rhythm: Normal rate.  ?   Pulses: Normal pulses.  ?   Arteriovenous access: Left arteriovenous access is present. ?   Comments: Good thrill and slightly increased bruit at midportion ?Pulmonary:  ?   Effort: Pulmonary effort is normal.  ?Skin: ?   General: Skin is warm and dry.  ?Neurological:  ?   Mental Status: He is alert and oriented to person, place, and time.  ?Psychiatric:     ?   Mood and Affect: Mood normal.     ?   Behavior: Behavior normal.     ?   Thought Content: Thought content normal.     ?   Judgment: Judgment normal.  ? ? ?BP 100/68 (BP Location: Right Arm)    Pulse 87   Resp 16   Wt 248 lb (112.5 kg)   BMI 43.93 kg/m?  ? ?Past Medical History:  ?Diagnosis Date  ? Autism   ? Chronic kidney disease   ? Hypertension   ? Murmur   ? OCD (obsessive compulsive disorder)   ? ? ?Social History  ? ?Socioeconomic History  ? Marital status: Single  ?  Spouse name: Not on file  ? Number of children: Not on file  ? Years of education: Not on file  ? Highest education level: Not on file  ?Occupational History  ? Not on file  ?Tobacco Use  ? Smoking status: Never  ? Smokeless tobacco: Never  ?Vaping Use  ? Vaping Use: Never used  ?Substance and Sexual Activity  ? Alcohol use: Never  ? Drug use: Never  ? Sexual activity: Not Currently  ?Other Topics Concern  ? Not on file  ?Social History Narrative  ? Lives with father .  ? ?Social Determinants of Health  ? ?Financial Resource Strain: Not on file  ?Food Insecurity: Not on file  ?Transportation Needs: Not on file  ?Physical Activity: Not on file  ?Stress: Not on file  ?Social Connections: Not on file  ?Intimate Partner Violence: Not on file  ? ? ?Past Surgical  History:  ?Procedure Laterality Date  ? A/V FISTULAGRAM Left 10/07/2020  ? Procedure: A/V FISTULAGRAM;  Surgeon: Katha Cabal, MD;  Location: [place] [facility];  Service: Cardiovascular;  Laterality: Left;  ? AV FISTULA PLACEMENT Left 07/15/2020  ? Procedure: ARTERIOVENOUS FISTULA CREATION;  Surgeon: Katha Cabal, MD;  Location: [facility];  Service: Vascular;  Laterality: Left;  ? DIALYSIS/PERMA CATHETER INSERTION N/A 04/06/2020  ? Procedure: DIALYSIS/PERMA CATHETER INSERTION;  Surgeon: Algernon Huxley, MD;  Location: [place] [facility];  Service: Cardiovascular;  Laterality: N/A;  ? DIALYSIS/PERMA CATHETER INSERTION N/A 05/26/2020  ? Procedure: DIALYSIS/PERMA CATHETER INSERTION;  Surgeon: Katha Cabal, MD;  Location: [place] [facility];  Service: Cardiovascular;  Laterality: N/A;  ? DIALYSIS/PERMA CATHETER INSERTION N/A 07/09/2020  ? Procedure: DIALYSIS/PERMA  CATHETER INSERTION;  Surgeon: Algernon Huxley, MD;  Location: [place] [facility];  Service: Cardiovascular;  Laterality: N/A;  ? DIALYSIS/PERMA CATHETER INSERTION N/A 01/08/2021  ? Procedure: DIALYSIS/PERMA CATHETER INSERTION;  Surgeon: Algernon Huxley, MD;  Location: [place] [facility];  Service: Cardiovascular;  Laterality: N/A;  ? DIALYSIS/PERMA CATHETER REMOVAL N/A 03/15/2021  ? Procedure: DIALYSIS/PERMA CATHETER REMOVAL;  Surgeon: Algernon Huxley, MD;  Location: [place] [facility];  Service: Cardiovascular;  Laterality: N/A;  ? REVISON OF ARTERIOVENOUS FISTULA Left 12/11/2020  ? Procedure: ARTERIOVENOUS (AV) FISTULA CREATION ( BRACHIAL CEPHALIC );  Surgeon: Katha Cabal, MD;  Location: [facility];  Service: Vascular;  Laterality: Left;  ? ? ?History reviewed. No pertinent family history. ? ?Allergies  ?Allergen Reactions  ? Chlorhexidine Other (See Comments)  ?  Blisters (topical)  ? ? ? ?  Latest Ref Rng & Units 12/11/2020  ?  9:20 AM 12/09/2020  ? 10:12 AM 07/13/2020  ? 11:12 AM  ?CBC  ?WBC 4.0 - 10.5 K/uL  7.9   7.2    ?Hemoglobin 13.0 - 17.0 g/dL 11.2   10.3   11.9    ?Hematocrit 39.0 - 52.0 % 33.0   30.7   36.7    ?Platelets 150 - 400 K/uL  179   170    ? ? ? ? ?CMP  ?   ?Component Value Date/Time  ? NA 140 12/11/2020 0920  ? K 4.0 12/11/2020 0920  ? CL 97 (L) 12/11/2020 0920  ? CO2 30 12/09/2020 1012  ? GLUCOSE 85 12/11/2020 0920  ? BUN 40 (H) 12/11/2020 0920  ? CREATININE 12.10 (H) 12/11/2020 0920  ? CALCIUM 8.9 12/09/2020 1012  ? PROT 6.8 04/24/2020 1219  ? ALBUMIN 3.5 04/24/2020 1219  ? AST 25 04/24/2020 1219  ? ALT 14 04/24/2020 1219  ? ALKPHOS 47 04/24/2020 1219  ? BILITOT 0.6 04/24/2020 1219  ? GFRNONAA 4 (L) 12/09/2020 1012  ? GFRAA 8 (L) 04/24/2020 1219  ? ? ? ?No results found. ? ?   ?Assessment & Plan:  ? ?1. ESRD (end stage renal disease) ([place]) ?Recommend: ? ?The patient is experiencing increasing problems with their dialysis access. ? ?Patient should have a fistulagram with the intention for  intervention.  The intention for intervention is to restore appropriate flow and prevent thrombosis and possible loss of the access.  As well as improve the quality of dialysis therapy. ? ?The risks, benefits and alternative therapies were reviewed in detail with the patient.  All questions were answered.  The patient agrees to proceed with angio/intervention.   ? ?The patient will follow up with me in the office after the procedure. ?  ? ?  2. Essential hypertension ?Continue antihypertensive medications as already ordered, these medications have been reviewed and there are no changes at this time.  ? ? ?Current Outpatient Medications on File Prior to Visit  ?Medication Sig Dispense Refill  ? B Complex-C-Folic Acid (RENA-VITE PO) Take 1 tablet by mouth daily.    ? calcium acetate (PHOSLO) 667 MG capsule     ? Cholecalciferol (VITAMIN D-3) 125 MCG (5000 UT) TABS Take 5,000 Units by mouth daily.    ? dutasteride (AVODART) 0.5 MG capsule Take 1 capsule (0.5 mg total) by mouth daily. 30 capsule 11  ? Methoxy PEG-Epoetin Beta (MIRCERA IJ) Mircera    ? midodrine (PROAMATINE) 5 MG tablet Take 5 mg by mouth daily as needed.    ? multivitamin (RENA-VIT) TABS tablet Take by mouth.    ? tamsulosin (FLOMAX) 0.4 MG CAPS capsule Take by mouth.    ? ?No current facility-administered medications on file prior to visit.  ? ? ?There are no Patient Instructions on file for this visit. ?No follow-ups on file. ? ? ?Kris Hartmann, NP ? ? ?

## 2022-01-08 DIAGNOSIS — N2581 Secondary hyperparathyroidism of renal origin: Secondary | ICD-10-CM | POA: Diagnosis not present

## 2022-01-08 DIAGNOSIS — N186 End stage renal disease: Secondary | ICD-10-CM | POA: Diagnosis not present

## 2022-01-08 DIAGNOSIS — Z992 Dependence on renal dialysis: Secondary | ICD-10-CM | POA: Diagnosis not present

## 2022-01-11 DIAGNOSIS — N2581 Secondary hyperparathyroidism of renal origin: Secondary | ICD-10-CM | POA: Diagnosis not present

## 2022-01-11 DIAGNOSIS — Z992 Dependence on renal dialysis: Secondary | ICD-10-CM | POA: Diagnosis not present

## 2022-01-11 DIAGNOSIS — N186 End stage renal disease: Secondary | ICD-10-CM | POA: Diagnosis not present

## 2022-01-13 DIAGNOSIS — N2581 Secondary hyperparathyroidism of renal origin: Secondary | ICD-10-CM | POA: Diagnosis not present

## 2022-01-13 DIAGNOSIS — Z992 Dependence on renal dialysis: Secondary | ICD-10-CM | POA: Diagnosis not present

## 2022-01-13 DIAGNOSIS — N186 End stage renal disease: Secondary | ICD-10-CM | POA: Diagnosis not present

## 2022-01-15 DIAGNOSIS — Z992 Dependence on renal dialysis: Secondary | ICD-10-CM | POA: Diagnosis not present

## 2022-01-15 DIAGNOSIS — N2581 Secondary hyperparathyroidism of renal origin: Secondary | ICD-10-CM | POA: Diagnosis not present

## 2022-01-15 DIAGNOSIS — N186 End stage renal disease: Secondary | ICD-10-CM | POA: Diagnosis not present

## 2022-01-18 DIAGNOSIS — Z992 Dependence on renal dialysis: Secondary | ICD-10-CM | POA: Diagnosis not present

## 2022-01-18 DIAGNOSIS — N186 End stage renal disease: Secondary | ICD-10-CM | POA: Diagnosis not present

## 2022-01-18 DIAGNOSIS — N2581 Secondary hyperparathyroidism of renal origin: Secondary | ICD-10-CM | POA: Diagnosis not present

## 2022-01-20 DIAGNOSIS — Z992 Dependence on renal dialysis: Secondary | ICD-10-CM | POA: Diagnosis not present

## 2022-01-20 DIAGNOSIS — N2581 Secondary hyperparathyroidism of renal origin: Secondary | ICD-10-CM | POA: Diagnosis not present

## 2022-01-20 DIAGNOSIS — N186 End stage renal disease: Secondary | ICD-10-CM | POA: Diagnosis not present

## 2022-01-22 DIAGNOSIS — N186 End stage renal disease: Secondary | ICD-10-CM | POA: Diagnosis not present

## 2022-01-22 DIAGNOSIS — N2581 Secondary hyperparathyroidism of renal origin: Secondary | ICD-10-CM | POA: Diagnosis not present

## 2022-01-22 DIAGNOSIS — Z992 Dependence on renal dialysis: Secondary | ICD-10-CM | POA: Diagnosis not present

## 2022-01-25 DIAGNOSIS — Z992 Dependence on renal dialysis: Secondary | ICD-10-CM | POA: Diagnosis not present

## 2022-01-25 DIAGNOSIS — N186 End stage renal disease: Secondary | ICD-10-CM | POA: Diagnosis not present

## 2022-01-25 DIAGNOSIS — N2581 Secondary hyperparathyroidism of renal origin: Secondary | ICD-10-CM | POA: Diagnosis not present

## 2022-01-26 DIAGNOSIS — Z992 Dependence on renal dialysis: Secondary | ICD-10-CM | POA: Diagnosis not present

## 2022-01-26 DIAGNOSIS — N186 End stage renal disease: Secondary | ICD-10-CM | POA: Diagnosis not present

## 2022-01-27 DIAGNOSIS — Z992 Dependence on renal dialysis: Secondary | ICD-10-CM | POA: Diagnosis not present

## 2022-01-27 DIAGNOSIS — N186 End stage renal disease: Secondary | ICD-10-CM | POA: Diagnosis not present

## 2022-01-27 DIAGNOSIS — N2581 Secondary hyperparathyroidism of renal origin: Secondary | ICD-10-CM | POA: Diagnosis not present

## 2022-01-28 DIAGNOSIS — Z0001 Encounter for general adult medical examination with abnormal findings: Secondary | ICD-10-CM | POA: Diagnosis not present

## 2022-01-28 DIAGNOSIS — F840 Autistic disorder: Secondary | ICD-10-CM | POA: Diagnosis not present

## 2022-01-28 DIAGNOSIS — N186 End stage renal disease: Secondary | ICD-10-CM | POA: Diagnosis not present

## 2022-01-28 DIAGNOSIS — E782 Mixed hyperlipidemia: Secondary | ICD-10-CM | POA: Diagnosis not present

## 2022-01-28 DIAGNOSIS — I10 Essential (primary) hypertension: Secondary | ICD-10-CM | POA: Diagnosis not present

## 2022-01-28 DIAGNOSIS — E559 Vitamin D deficiency, unspecified: Secondary | ICD-10-CM | POA: Diagnosis not present

## 2022-01-28 DIAGNOSIS — Z1331 Encounter for screening for depression: Secondary | ICD-10-CM | POA: Diagnosis not present

## 2022-01-29 DIAGNOSIS — N186 End stage renal disease: Secondary | ICD-10-CM | POA: Diagnosis not present

## 2022-01-29 DIAGNOSIS — N2581 Secondary hyperparathyroidism of renal origin: Secondary | ICD-10-CM | POA: Diagnosis not present

## 2022-01-29 DIAGNOSIS — Z992 Dependence on renal dialysis: Secondary | ICD-10-CM | POA: Diagnosis not present

## 2022-02-01 DIAGNOSIS — Z992 Dependence on renal dialysis: Secondary | ICD-10-CM | POA: Diagnosis not present

## 2022-02-01 DIAGNOSIS — N186 End stage renal disease: Secondary | ICD-10-CM | POA: Diagnosis not present

## 2022-02-01 DIAGNOSIS — N2581 Secondary hyperparathyroidism of renal origin: Secondary | ICD-10-CM | POA: Diagnosis not present

## 2022-02-03 DIAGNOSIS — Z992 Dependence on renal dialysis: Secondary | ICD-10-CM | POA: Diagnosis not present

## 2022-02-03 DIAGNOSIS — N186 End stage renal disease: Secondary | ICD-10-CM | POA: Diagnosis not present

## 2022-02-03 DIAGNOSIS — N2581 Secondary hyperparathyroidism of renal origin: Secondary | ICD-10-CM | POA: Diagnosis not present

## 2022-02-05 DIAGNOSIS — Z992 Dependence on renal dialysis: Secondary | ICD-10-CM | POA: Diagnosis not present

## 2022-02-05 DIAGNOSIS — N2581 Secondary hyperparathyroidism of renal origin: Secondary | ICD-10-CM | POA: Diagnosis not present

## 2022-02-05 DIAGNOSIS — N186 End stage renal disease: Secondary | ICD-10-CM | POA: Diagnosis not present

## 2022-02-08 DIAGNOSIS — Z992 Dependence on renal dialysis: Secondary | ICD-10-CM | POA: Diagnosis not present

## 2022-02-08 DIAGNOSIS — N186 End stage renal disease: Secondary | ICD-10-CM | POA: Diagnosis not present

## 2022-02-08 DIAGNOSIS — N2581 Secondary hyperparathyroidism of renal origin: Secondary | ICD-10-CM | POA: Diagnosis not present

## 2022-02-10 DIAGNOSIS — Z992 Dependence on renal dialysis: Secondary | ICD-10-CM | POA: Diagnosis not present

## 2022-02-10 DIAGNOSIS — N186 End stage renal disease: Secondary | ICD-10-CM | POA: Diagnosis not present

## 2022-02-10 DIAGNOSIS — N2581 Secondary hyperparathyroidism of renal origin: Secondary | ICD-10-CM | POA: Diagnosis not present

## 2022-02-13 DIAGNOSIS — N186 End stage renal disease: Secondary | ICD-10-CM | POA: Diagnosis not present

## 2022-02-13 DIAGNOSIS — N2581 Secondary hyperparathyroidism of renal origin: Secondary | ICD-10-CM | POA: Diagnosis not present

## 2022-02-13 DIAGNOSIS — Z992 Dependence on renal dialysis: Secondary | ICD-10-CM | POA: Diagnosis not present

## 2022-02-15 DIAGNOSIS — N2581 Secondary hyperparathyroidism of renal origin: Secondary | ICD-10-CM | POA: Diagnosis not present

## 2022-02-15 DIAGNOSIS — Z992 Dependence on renal dialysis: Secondary | ICD-10-CM | POA: Diagnosis not present

## 2022-02-15 DIAGNOSIS — N186 End stage renal disease: Secondary | ICD-10-CM | POA: Diagnosis not present

## 2022-02-17 DIAGNOSIS — Z992 Dependence on renal dialysis: Secondary | ICD-10-CM | POA: Diagnosis not present

## 2022-02-17 DIAGNOSIS — N2581 Secondary hyperparathyroidism of renal origin: Secondary | ICD-10-CM | POA: Diagnosis not present

## 2022-02-17 DIAGNOSIS — N186 End stage renal disease: Secondary | ICD-10-CM | POA: Diagnosis not present

## 2022-02-19 DIAGNOSIS — N186 End stage renal disease: Secondary | ICD-10-CM | POA: Diagnosis not present

## 2022-02-19 DIAGNOSIS — N2581 Secondary hyperparathyroidism of renal origin: Secondary | ICD-10-CM | POA: Diagnosis not present

## 2022-02-19 DIAGNOSIS — Z992 Dependence on renal dialysis: Secondary | ICD-10-CM | POA: Diagnosis not present

## 2022-02-22 DIAGNOSIS — Z992 Dependence on renal dialysis: Secondary | ICD-10-CM | POA: Diagnosis not present

## 2022-02-22 DIAGNOSIS — N2581 Secondary hyperparathyroidism of renal origin: Secondary | ICD-10-CM | POA: Diagnosis not present

## 2022-02-22 DIAGNOSIS — N186 End stage renal disease: Secondary | ICD-10-CM | POA: Diagnosis not present

## 2022-02-24 DIAGNOSIS — Z992 Dependence on renal dialysis: Secondary | ICD-10-CM | POA: Diagnosis not present

## 2022-02-24 DIAGNOSIS — N186 End stage renal disease: Secondary | ICD-10-CM | POA: Diagnosis not present

## 2022-02-24 DIAGNOSIS — N2581 Secondary hyperparathyroidism of renal origin: Secondary | ICD-10-CM | POA: Diagnosis not present

## 2022-02-25 DIAGNOSIS — N186 End stage renal disease: Secondary | ICD-10-CM | POA: Diagnosis not present

## 2022-02-25 DIAGNOSIS — Z992 Dependence on renal dialysis: Secondary | ICD-10-CM | POA: Diagnosis not present

## 2022-02-26 DIAGNOSIS — N2581 Secondary hyperparathyroidism of renal origin: Secondary | ICD-10-CM | POA: Diagnosis not present

## 2022-02-26 DIAGNOSIS — Z992 Dependence on renal dialysis: Secondary | ICD-10-CM | POA: Diagnosis not present

## 2022-02-26 DIAGNOSIS — N186 End stage renal disease: Secondary | ICD-10-CM | POA: Diagnosis not present

## 2022-03-01 DIAGNOSIS — Z992 Dependence on renal dialysis: Secondary | ICD-10-CM | POA: Diagnosis not present

## 2022-03-01 DIAGNOSIS — N2581 Secondary hyperparathyroidism of renal origin: Secondary | ICD-10-CM | POA: Diagnosis not present

## 2022-03-01 DIAGNOSIS — N186 End stage renal disease: Secondary | ICD-10-CM | POA: Diagnosis not present

## 2022-03-03 DIAGNOSIS — N186 End stage renal disease: Secondary | ICD-10-CM | POA: Diagnosis not present

## 2022-03-03 DIAGNOSIS — Z992 Dependence on renal dialysis: Secondary | ICD-10-CM | POA: Diagnosis not present

## 2022-03-03 DIAGNOSIS — N2581 Secondary hyperparathyroidism of renal origin: Secondary | ICD-10-CM | POA: Diagnosis not present

## 2022-03-05 DIAGNOSIS — Z992 Dependence on renal dialysis: Secondary | ICD-10-CM | POA: Diagnosis not present

## 2022-03-05 DIAGNOSIS — N186 End stage renal disease: Secondary | ICD-10-CM | POA: Diagnosis not present

## 2022-03-05 DIAGNOSIS — N2581 Secondary hyperparathyroidism of renal origin: Secondary | ICD-10-CM | POA: Diagnosis not present

## 2022-03-08 DIAGNOSIS — Z992 Dependence on renal dialysis: Secondary | ICD-10-CM | POA: Diagnosis not present

## 2022-03-08 DIAGNOSIS — N2581 Secondary hyperparathyroidism of renal origin: Secondary | ICD-10-CM | POA: Diagnosis not present

## 2022-03-08 DIAGNOSIS — N186 End stage renal disease: Secondary | ICD-10-CM | POA: Diagnosis not present

## 2022-03-10 DIAGNOSIS — N2581 Secondary hyperparathyroidism of renal origin: Secondary | ICD-10-CM | POA: Diagnosis not present

## 2022-03-10 DIAGNOSIS — Z992 Dependence on renal dialysis: Secondary | ICD-10-CM | POA: Diagnosis not present

## 2022-03-10 DIAGNOSIS — N186 End stage renal disease: Secondary | ICD-10-CM | POA: Diagnosis not present

## 2022-03-12 DIAGNOSIS — N2581 Secondary hyperparathyroidism of renal origin: Secondary | ICD-10-CM | POA: Diagnosis not present

## 2022-03-12 DIAGNOSIS — N186 End stage renal disease: Secondary | ICD-10-CM | POA: Diagnosis not present

## 2022-03-12 DIAGNOSIS — Z992 Dependence on renal dialysis: Secondary | ICD-10-CM | POA: Diagnosis not present

## 2022-03-15 DIAGNOSIS — N186 End stage renal disease: Secondary | ICD-10-CM | POA: Diagnosis not present

## 2022-03-15 DIAGNOSIS — Z992 Dependence on renal dialysis: Secondary | ICD-10-CM | POA: Diagnosis not present

## 2022-03-15 DIAGNOSIS — N2581 Secondary hyperparathyroidism of renal origin: Secondary | ICD-10-CM | POA: Diagnosis not present

## 2022-03-17 DIAGNOSIS — N186 End stage renal disease: Secondary | ICD-10-CM | POA: Diagnosis not present

## 2022-03-17 DIAGNOSIS — Z992 Dependence on renal dialysis: Secondary | ICD-10-CM | POA: Diagnosis not present

## 2022-03-17 DIAGNOSIS — N2581 Secondary hyperparathyroidism of renal origin: Secondary | ICD-10-CM | POA: Diagnosis not present

## 2022-03-19 DIAGNOSIS — N186 End stage renal disease: Secondary | ICD-10-CM | POA: Diagnosis not present

## 2022-03-19 DIAGNOSIS — Z992 Dependence on renal dialysis: Secondary | ICD-10-CM | POA: Diagnosis not present

## 2022-03-19 DIAGNOSIS — N2581 Secondary hyperparathyroidism of renal origin: Secondary | ICD-10-CM | POA: Diagnosis not present

## 2022-03-22 DIAGNOSIS — N186 End stage renal disease: Secondary | ICD-10-CM | POA: Diagnosis not present

## 2022-03-22 DIAGNOSIS — Z992 Dependence on renal dialysis: Secondary | ICD-10-CM | POA: Diagnosis not present

## 2022-03-22 DIAGNOSIS — N2581 Secondary hyperparathyroidism of renal origin: Secondary | ICD-10-CM | POA: Diagnosis not present

## 2022-03-24 DIAGNOSIS — N2581 Secondary hyperparathyroidism of renal origin: Secondary | ICD-10-CM | POA: Diagnosis not present

## 2022-03-24 DIAGNOSIS — N186 End stage renal disease: Secondary | ICD-10-CM | POA: Diagnosis not present

## 2022-03-24 DIAGNOSIS — Z992 Dependence on renal dialysis: Secondary | ICD-10-CM | POA: Diagnosis not present

## 2022-03-26 DIAGNOSIS — Z992 Dependence on renal dialysis: Secondary | ICD-10-CM | POA: Diagnosis not present

## 2022-03-26 DIAGNOSIS — N186 End stage renal disease: Secondary | ICD-10-CM | POA: Diagnosis not present

## 2022-03-26 DIAGNOSIS — N2581 Secondary hyperparathyroidism of renal origin: Secondary | ICD-10-CM | POA: Diagnosis not present

## 2022-03-28 DIAGNOSIS — Z992 Dependence on renal dialysis: Secondary | ICD-10-CM | POA: Diagnosis not present

## 2022-03-28 DIAGNOSIS — N186 End stage renal disease: Secondary | ICD-10-CM | POA: Diagnosis not present

## 2022-03-29 DIAGNOSIS — N2581 Secondary hyperparathyroidism of renal origin: Secondary | ICD-10-CM | POA: Diagnosis not present

## 2022-03-29 DIAGNOSIS — Z992 Dependence on renal dialysis: Secondary | ICD-10-CM | POA: Diagnosis not present

## 2022-03-29 DIAGNOSIS — N186 End stage renal disease: Secondary | ICD-10-CM | POA: Diagnosis not present

## 2022-03-31 DIAGNOSIS — N186 End stage renal disease: Secondary | ICD-10-CM | POA: Diagnosis not present

## 2022-03-31 DIAGNOSIS — Z992 Dependence on renal dialysis: Secondary | ICD-10-CM | POA: Diagnosis not present

## 2022-03-31 DIAGNOSIS — N2581 Secondary hyperparathyroidism of renal origin: Secondary | ICD-10-CM | POA: Diagnosis not present

## 2022-04-01 DIAGNOSIS — N186 End stage renal disease: Secondary | ICD-10-CM | POA: Diagnosis not present

## 2022-04-01 DIAGNOSIS — N2581 Secondary hyperparathyroidism of renal origin: Secondary | ICD-10-CM | POA: Diagnosis not present

## 2022-04-01 DIAGNOSIS — Z992 Dependence on renal dialysis: Secondary | ICD-10-CM | POA: Diagnosis not present

## 2022-04-05 DIAGNOSIS — E8779 Other fluid overload: Secondary | ICD-10-CM | POA: Diagnosis not present

## 2022-04-05 DIAGNOSIS — N2581 Secondary hyperparathyroidism of renal origin: Secondary | ICD-10-CM | POA: Diagnosis not present

## 2022-04-05 DIAGNOSIS — N186 End stage renal disease: Secondary | ICD-10-CM | POA: Diagnosis not present

## 2022-04-05 DIAGNOSIS — Z992 Dependence on renal dialysis: Secondary | ICD-10-CM | POA: Diagnosis not present

## 2022-04-07 DIAGNOSIS — N186 End stage renal disease: Secondary | ICD-10-CM | POA: Diagnosis not present

## 2022-04-07 DIAGNOSIS — E8779 Other fluid overload: Secondary | ICD-10-CM | POA: Diagnosis not present

## 2022-04-07 DIAGNOSIS — N2581 Secondary hyperparathyroidism of renal origin: Secondary | ICD-10-CM | POA: Diagnosis not present

## 2022-04-07 DIAGNOSIS — Z992 Dependence on renal dialysis: Secondary | ICD-10-CM | POA: Diagnosis not present

## 2022-04-08 DIAGNOSIS — E8779 Other fluid overload: Secondary | ICD-10-CM | POA: Diagnosis not present

## 2022-04-08 DIAGNOSIS — N186 End stage renal disease: Secondary | ICD-10-CM | POA: Diagnosis not present

## 2022-04-08 DIAGNOSIS — N2581 Secondary hyperparathyroidism of renal origin: Secondary | ICD-10-CM | POA: Diagnosis not present

## 2022-04-08 DIAGNOSIS — Z992 Dependence on renal dialysis: Secondary | ICD-10-CM | POA: Diagnosis not present

## 2022-04-09 DIAGNOSIS — N2581 Secondary hyperparathyroidism of renal origin: Secondary | ICD-10-CM | POA: Diagnosis not present

## 2022-04-09 DIAGNOSIS — E8779 Other fluid overload: Secondary | ICD-10-CM | POA: Diagnosis not present

## 2022-04-09 DIAGNOSIS — N186 End stage renal disease: Secondary | ICD-10-CM | POA: Diagnosis not present

## 2022-04-09 DIAGNOSIS — Z992 Dependence on renal dialysis: Secondary | ICD-10-CM | POA: Diagnosis not present

## 2022-04-12 DIAGNOSIS — N2581 Secondary hyperparathyroidism of renal origin: Secondary | ICD-10-CM | POA: Diagnosis not present

## 2022-04-12 DIAGNOSIS — N186 End stage renal disease: Secondary | ICD-10-CM | POA: Diagnosis not present

## 2022-04-12 DIAGNOSIS — Z992 Dependence on renal dialysis: Secondary | ICD-10-CM | POA: Diagnosis not present

## 2022-04-14 DIAGNOSIS — N2581 Secondary hyperparathyroidism of renal origin: Secondary | ICD-10-CM | POA: Diagnosis not present

## 2022-04-14 DIAGNOSIS — Z992 Dependence on renal dialysis: Secondary | ICD-10-CM | POA: Diagnosis not present

## 2022-04-14 DIAGNOSIS — N186 End stage renal disease: Secondary | ICD-10-CM | POA: Diagnosis not present

## 2022-04-16 DIAGNOSIS — Z992 Dependence on renal dialysis: Secondary | ICD-10-CM | POA: Diagnosis not present

## 2022-04-16 DIAGNOSIS — N2581 Secondary hyperparathyroidism of renal origin: Secondary | ICD-10-CM | POA: Diagnosis not present

## 2022-04-16 DIAGNOSIS — N186 End stage renal disease: Secondary | ICD-10-CM | POA: Diagnosis not present

## 2022-04-19 DIAGNOSIS — Z992 Dependence on renal dialysis: Secondary | ICD-10-CM | POA: Diagnosis not present

## 2022-04-19 DIAGNOSIS — N2581 Secondary hyperparathyroidism of renal origin: Secondary | ICD-10-CM | POA: Diagnosis not present

## 2022-04-19 DIAGNOSIS — N186 End stage renal disease: Secondary | ICD-10-CM | POA: Diagnosis not present

## 2022-04-21 DIAGNOSIS — N186 End stage renal disease: Secondary | ICD-10-CM | POA: Diagnosis not present

## 2022-04-21 DIAGNOSIS — Z992 Dependence on renal dialysis: Secondary | ICD-10-CM | POA: Diagnosis not present

## 2022-04-21 DIAGNOSIS — N2581 Secondary hyperparathyroidism of renal origin: Secondary | ICD-10-CM | POA: Diagnosis not present

## 2022-04-23 DIAGNOSIS — N2581 Secondary hyperparathyroidism of renal origin: Secondary | ICD-10-CM | POA: Diagnosis not present

## 2022-04-23 DIAGNOSIS — Z992 Dependence on renal dialysis: Secondary | ICD-10-CM | POA: Diagnosis not present

## 2022-04-23 DIAGNOSIS — N186 End stage renal disease: Secondary | ICD-10-CM | POA: Diagnosis not present

## 2022-04-26 DIAGNOSIS — Z992 Dependence on renal dialysis: Secondary | ICD-10-CM | POA: Diagnosis not present

## 2022-04-26 DIAGNOSIS — N186 End stage renal disease: Secondary | ICD-10-CM | POA: Diagnosis not present

## 2022-04-26 DIAGNOSIS — N2581 Secondary hyperparathyroidism of renal origin: Secondary | ICD-10-CM | POA: Diagnosis not present

## 2022-04-28 DIAGNOSIS — Z992 Dependence on renal dialysis: Secondary | ICD-10-CM | POA: Diagnosis not present

## 2022-04-28 DIAGNOSIS — N2581 Secondary hyperparathyroidism of renal origin: Secondary | ICD-10-CM | POA: Diagnosis not present

## 2022-04-28 DIAGNOSIS — N186 End stage renal disease: Secondary | ICD-10-CM | POA: Diagnosis not present

## 2022-04-30 DIAGNOSIS — Z992 Dependence on renal dialysis: Secondary | ICD-10-CM | POA: Diagnosis not present

## 2022-04-30 DIAGNOSIS — N2581 Secondary hyperparathyroidism of renal origin: Secondary | ICD-10-CM | POA: Diagnosis not present

## 2022-04-30 DIAGNOSIS — N186 End stage renal disease: Secondary | ICD-10-CM | POA: Diagnosis not present

## 2022-05-03 DIAGNOSIS — N2581 Secondary hyperparathyroidism of renal origin: Secondary | ICD-10-CM | POA: Diagnosis not present

## 2022-05-03 DIAGNOSIS — Z992 Dependence on renal dialysis: Secondary | ICD-10-CM | POA: Diagnosis not present

## 2022-05-03 DIAGNOSIS — N186 End stage renal disease: Secondary | ICD-10-CM | POA: Diagnosis not present

## 2022-05-05 DIAGNOSIS — Z992 Dependence on renal dialysis: Secondary | ICD-10-CM | POA: Diagnosis not present

## 2022-05-05 DIAGNOSIS — N2581 Secondary hyperparathyroidism of renal origin: Secondary | ICD-10-CM | POA: Diagnosis not present

## 2022-05-05 DIAGNOSIS — N186 End stage renal disease: Secondary | ICD-10-CM | POA: Diagnosis not present

## 2022-05-06 DIAGNOSIS — I10 Essential (primary) hypertension: Secondary | ICD-10-CM | POA: Diagnosis not present

## 2022-05-06 DIAGNOSIS — F840 Autistic disorder: Secondary | ICD-10-CM | POA: Diagnosis not present

## 2022-05-06 DIAGNOSIS — E782 Mixed hyperlipidemia: Secondary | ICD-10-CM | POA: Diagnosis not present

## 2022-05-06 DIAGNOSIS — E559 Vitamin D deficiency, unspecified: Secondary | ICD-10-CM | POA: Diagnosis not present

## 2022-05-06 DIAGNOSIS — N186 End stage renal disease: Secondary | ICD-10-CM | POA: Diagnosis not present

## 2022-05-07 DIAGNOSIS — N186 End stage renal disease: Secondary | ICD-10-CM | POA: Diagnosis not present

## 2022-05-07 DIAGNOSIS — N2581 Secondary hyperparathyroidism of renal origin: Secondary | ICD-10-CM | POA: Diagnosis not present

## 2022-05-07 DIAGNOSIS — Z992 Dependence on renal dialysis: Secondary | ICD-10-CM | POA: Diagnosis not present

## 2022-05-10 ENCOUNTER — Telehealth: Payer: Self-pay

## 2022-05-10 DIAGNOSIS — N186 End stage renal disease: Secondary | ICD-10-CM | POA: Diagnosis not present

## 2022-05-10 DIAGNOSIS — Z992 Dependence on renal dialysis: Secondary | ICD-10-CM | POA: Diagnosis not present

## 2022-05-10 DIAGNOSIS — N2581 Secondary hyperparathyroidism of renal origin: Secondary | ICD-10-CM | POA: Diagnosis not present

## 2022-05-10 NOTE — Patient Outreach (Signed)
  Care Coordination   Initial Visit Note   05/10/2022 Name: STEVE YOUNGBERG MRN: [id number] DOB: [birth year]  Ephraim Hamburger II is a 51 y.o. year old male who sees Jodi Marble, MD for primary care. I spoke with  Ephraim Hamburger II by phone today.  What matters to the patients health and wellness today?  Patient voices things going well. Denies any acute issues or concerns. He is adhering to HD schedule(T,T,S). He has supportive ather who helps him out as needs. Discussed with patient benefits including CM services thru [facility] program and encouraged patient to contact them.     Goals Addressed             This Visit's Progress    COMPLETED: Care Coordination-no follow up required       Care Coordination Interventions: Provided education to patient re: [employer]/Medicaid benefits through insurance providers Assessed social determinant of health barriers          SDOH assessments and interventions completed:  Yes  SDOH Interventions Today    Flowsheet Row Most Recent Value  SDOH Interventions   Food Insecurity Interventions Intervention Not Indicated  Transportation Interventions Intervention Not Indicated        Care Coordination Interventions Activated:  Yes  Care Coordination Interventions:  Yes, provided   Follow up plan: No further intervention required.   Encounter Outcome:  Pt. Visit Completed    Enzo Montgomery, RN,BSN,CCM [facility] Telephonic Care Management Coordinator Direct Phone: [phone] Toll Free: [phone] Fax: [phone]

## 2022-05-12 DIAGNOSIS — Z992 Dependence on renal dialysis: Secondary | ICD-10-CM | POA: Diagnosis not present

## 2022-05-12 DIAGNOSIS — N186 End stage renal disease: Secondary | ICD-10-CM | POA: Diagnosis not present

## 2022-05-12 DIAGNOSIS — N2581 Secondary hyperparathyroidism of renal origin: Secondary | ICD-10-CM | POA: Diagnosis not present

## 2022-05-14 DIAGNOSIS — N2581 Secondary hyperparathyroidism of renal origin: Secondary | ICD-10-CM | POA: Diagnosis not present

## 2022-05-14 DIAGNOSIS — Z992 Dependence on renal dialysis: Secondary | ICD-10-CM | POA: Diagnosis not present

## 2022-05-14 DIAGNOSIS — N186 End stage renal disease: Secondary | ICD-10-CM | POA: Diagnosis not present

## 2022-05-17 DIAGNOSIS — Z992 Dependence on renal dialysis: Secondary | ICD-10-CM | POA: Diagnosis not present

## 2022-05-17 DIAGNOSIS — N186 End stage renal disease: Secondary | ICD-10-CM | POA: Diagnosis not present

## 2022-05-17 DIAGNOSIS — N2581 Secondary hyperparathyroidism of renal origin: Secondary | ICD-10-CM | POA: Diagnosis not present

## 2022-05-19 DIAGNOSIS — Z992 Dependence on renal dialysis: Secondary | ICD-10-CM | POA: Diagnosis not present

## 2022-05-19 DIAGNOSIS — N186 End stage renal disease: Secondary | ICD-10-CM | POA: Diagnosis not present

## 2022-05-19 DIAGNOSIS — N2581 Secondary hyperparathyroidism of renal origin: Secondary | ICD-10-CM | POA: Diagnosis not present

## 2022-05-21 DIAGNOSIS — Z992 Dependence on renal dialysis: Secondary | ICD-10-CM | POA: Diagnosis not present

## 2022-05-21 DIAGNOSIS — N2581 Secondary hyperparathyroidism of renal origin: Secondary | ICD-10-CM | POA: Diagnosis not present

## 2022-05-21 DIAGNOSIS — N186 End stage renal disease: Secondary | ICD-10-CM | POA: Diagnosis not present

## 2022-05-24 DIAGNOSIS — N2581 Secondary hyperparathyroidism of renal origin: Secondary | ICD-10-CM | POA: Diagnosis not present

## 2022-05-24 DIAGNOSIS — Z992 Dependence on renal dialysis: Secondary | ICD-10-CM | POA: Diagnosis not present

## 2022-05-24 DIAGNOSIS — N186 End stage renal disease: Secondary | ICD-10-CM | POA: Diagnosis not present

## 2022-05-26 DIAGNOSIS — N186 End stage renal disease: Secondary | ICD-10-CM | POA: Diagnosis not present

## 2022-05-26 DIAGNOSIS — N2581 Secondary hyperparathyroidism of renal origin: Secondary | ICD-10-CM | POA: Diagnosis not present

## 2022-05-26 DIAGNOSIS — Z992 Dependence on renal dialysis: Secondary | ICD-10-CM | POA: Diagnosis not present

## 2022-05-28 DIAGNOSIS — Z992 Dependence on renal dialysis: Secondary | ICD-10-CM | POA: Diagnosis not present

## 2022-05-28 DIAGNOSIS — N2581 Secondary hyperparathyroidism of renal origin: Secondary | ICD-10-CM | POA: Diagnosis not present

## 2022-05-28 DIAGNOSIS — N186 End stage renal disease: Secondary | ICD-10-CM | POA: Diagnosis not present

## 2022-05-31 DIAGNOSIS — N2581 Secondary hyperparathyroidism of renal origin: Secondary | ICD-10-CM | POA: Diagnosis not present

## 2022-05-31 DIAGNOSIS — N186 End stage renal disease: Secondary | ICD-10-CM | POA: Diagnosis not present

## 2022-05-31 DIAGNOSIS — Z992 Dependence on renal dialysis: Secondary | ICD-10-CM | POA: Diagnosis not present

## 2022-06-02 DIAGNOSIS — N186 End stage renal disease: Secondary | ICD-10-CM | POA: Diagnosis not present

## 2022-06-02 DIAGNOSIS — Z992 Dependence on renal dialysis: Secondary | ICD-10-CM | POA: Diagnosis not present

## 2022-06-02 DIAGNOSIS — N2581 Secondary hyperparathyroidism of renal origin: Secondary | ICD-10-CM | POA: Diagnosis not present

## 2022-06-04 DIAGNOSIS — N186 End stage renal disease: Secondary | ICD-10-CM | POA: Diagnosis not present

## 2022-06-04 DIAGNOSIS — Z992 Dependence on renal dialysis: Secondary | ICD-10-CM | POA: Diagnosis not present

## 2022-06-04 DIAGNOSIS — N2581 Secondary hyperparathyroidism of renal origin: Secondary | ICD-10-CM | POA: Diagnosis not present

## 2022-06-07 DIAGNOSIS — Z992 Dependence on renal dialysis: Secondary | ICD-10-CM | POA: Diagnosis not present

## 2022-06-07 DIAGNOSIS — N186 End stage renal disease: Secondary | ICD-10-CM | POA: Diagnosis not present

## 2022-06-07 DIAGNOSIS — N2581 Secondary hyperparathyroidism of renal origin: Secondary | ICD-10-CM | POA: Diagnosis not present

## 2022-06-09 DIAGNOSIS — N2581 Secondary hyperparathyroidism of renal origin: Secondary | ICD-10-CM | POA: Diagnosis not present

## 2022-06-09 DIAGNOSIS — N186 End stage renal disease: Secondary | ICD-10-CM | POA: Diagnosis not present

## 2022-06-09 DIAGNOSIS — Z992 Dependence on renal dialysis: Secondary | ICD-10-CM | POA: Diagnosis not present

## 2022-06-11 DIAGNOSIS — N2581 Secondary hyperparathyroidism of renal origin: Secondary | ICD-10-CM | POA: Diagnosis not present

## 2022-06-11 DIAGNOSIS — N186 End stage renal disease: Secondary | ICD-10-CM | POA: Diagnosis not present

## 2022-06-11 DIAGNOSIS — Z992 Dependence on renal dialysis: Secondary | ICD-10-CM | POA: Diagnosis not present

## 2022-06-14 DIAGNOSIS — N186 End stage renal disease: Secondary | ICD-10-CM | POA: Diagnosis not present

## 2022-06-14 DIAGNOSIS — Z992 Dependence on renal dialysis: Secondary | ICD-10-CM | POA: Diagnosis not present

## 2022-06-14 DIAGNOSIS — N2581 Secondary hyperparathyroidism of renal origin: Secondary | ICD-10-CM | POA: Diagnosis not present

## 2022-06-16 DIAGNOSIS — N2581 Secondary hyperparathyroidism of renal origin: Secondary | ICD-10-CM | POA: Diagnosis not present

## 2022-06-16 DIAGNOSIS — N186 End stage renal disease: Secondary | ICD-10-CM | POA: Diagnosis not present

## 2022-06-16 DIAGNOSIS — Z992 Dependence on renal dialysis: Secondary | ICD-10-CM | POA: Diagnosis not present

## 2022-06-18 DIAGNOSIS — N2581 Secondary hyperparathyroidism of renal origin: Secondary | ICD-10-CM | POA: Diagnosis not present

## 2022-06-18 DIAGNOSIS — N186 End stage renal disease: Secondary | ICD-10-CM | POA: Diagnosis not present

## 2022-06-18 DIAGNOSIS — Z992 Dependence on renal dialysis: Secondary | ICD-10-CM | POA: Diagnosis not present

## 2022-06-21 DIAGNOSIS — N2581 Secondary hyperparathyroidism of renal origin: Secondary | ICD-10-CM | POA: Diagnosis not present

## 2022-06-21 DIAGNOSIS — N186 End stage renal disease: Secondary | ICD-10-CM | POA: Diagnosis not present

## 2022-06-21 DIAGNOSIS — Z992 Dependence on renal dialysis: Secondary | ICD-10-CM | POA: Diagnosis not present

## 2022-06-23 DIAGNOSIS — Z992 Dependence on renal dialysis: Secondary | ICD-10-CM | POA: Diagnosis not present

## 2022-06-23 DIAGNOSIS — N2581 Secondary hyperparathyroidism of renal origin: Secondary | ICD-10-CM | POA: Diagnosis not present

## 2022-06-23 DIAGNOSIS — N186 End stage renal disease: Secondary | ICD-10-CM | POA: Diagnosis not present

## 2022-06-25 DIAGNOSIS — N2581 Secondary hyperparathyroidism of renal origin: Secondary | ICD-10-CM | POA: Diagnosis not present

## 2022-06-25 DIAGNOSIS — N186 End stage renal disease: Secondary | ICD-10-CM | POA: Diagnosis not present

## 2022-06-25 DIAGNOSIS — Z992 Dependence on renal dialysis: Secondary | ICD-10-CM | POA: Diagnosis not present

## 2022-06-28 DIAGNOSIS — N186 End stage renal disease: Secondary | ICD-10-CM | POA: Diagnosis not present

## 2022-06-28 DIAGNOSIS — Z992 Dependence on renal dialysis: Secondary | ICD-10-CM | POA: Diagnosis not present

## 2022-06-28 DIAGNOSIS — N2581 Secondary hyperparathyroidism of renal origin: Secondary | ICD-10-CM | POA: Diagnosis not present

## 2022-06-29 ENCOUNTER — Other Ambulatory Visit: Payer: Self-pay | Admitting: Physician Assistant

## 2022-06-30 DIAGNOSIS — N2581 Secondary hyperparathyroidism of renal origin: Secondary | ICD-10-CM | POA: Diagnosis not present

## 2022-06-30 DIAGNOSIS — N186 End stage renal disease: Secondary | ICD-10-CM | POA: Diagnosis not present

## 2022-06-30 DIAGNOSIS — Z992 Dependence on renal dialysis: Secondary | ICD-10-CM | POA: Diagnosis not present

## 2022-07-01 ENCOUNTER — Other Ambulatory Visit: Payer: Self-pay | Admitting: Physician Assistant

## 2022-07-02 DIAGNOSIS — N186 End stage renal disease: Secondary | ICD-10-CM | POA: Diagnosis not present

## 2022-07-02 DIAGNOSIS — N2581 Secondary hyperparathyroidism of renal origin: Secondary | ICD-10-CM | POA: Diagnosis not present

## 2022-07-02 DIAGNOSIS — Z992 Dependence on renal dialysis: Secondary | ICD-10-CM | POA: Diagnosis not present

## 2022-07-04 ENCOUNTER — Telehealth: Payer: Self-pay | Admitting: Urology

## 2022-07-04 ENCOUNTER — Other Ambulatory Visit: Payer: Self-pay | Admitting: *Deleted

## 2022-07-04 MED ORDER — TAMSULOSIN HCL 0.4 MG PO CAPS: 0.4000 mg | ORAL_CAPSULE | Freq: Every day | ORAL | 6 refills | Status: DC

## 2022-07-04 NOTE — Telephone Encounter (Signed)
Pt is totally out of his RX; FLOMAX .4 mg.  Please call it in to [facility]. [phone].   Please call [facility]. [phone] with any concerns.

## 2022-07-04 NOTE — Telephone Encounter (Signed)
Medication sent today to [facility]

## 2022-07-05 DIAGNOSIS — N2581 Secondary hyperparathyroidism of renal origin: Secondary | ICD-10-CM | POA: Diagnosis not present

## 2022-07-05 DIAGNOSIS — Z992 Dependence on renal dialysis: Secondary | ICD-10-CM | POA: Diagnosis not present

## 2022-07-05 DIAGNOSIS — N186 End stage renal disease: Secondary | ICD-10-CM | POA: Diagnosis not present

## 2022-07-07 DIAGNOSIS — N186 End stage renal disease: Secondary | ICD-10-CM | POA: Diagnosis not present

## 2022-07-07 DIAGNOSIS — N2581 Secondary hyperparathyroidism of renal origin: Secondary | ICD-10-CM | POA: Diagnosis not present

## 2022-07-07 DIAGNOSIS — Z992 Dependence on renal dialysis: Secondary | ICD-10-CM | POA: Diagnosis not present

## 2022-07-09 DIAGNOSIS — N2581 Secondary hyperparathyroidism of renal origin: Secondary | ICD-10-CM | POA: Diagnosis not present

## 2022-07-09 DIAGNOSIS — N186 End stage renal disease: Secondary | ICD-10-CM | POA: Diagnosis not present

## 2022-07-09 DIAGNOSIS — Z992 Dependence on renal dialysis: Secondary | ICD-10-CM | POA: Diagnosis not present

## 2022-07-12 DIAGNOSIS — N2581 Secondary hyperparathyroidism of renal origin: Secondary | ICD-10-CM | POA: Diagnosis not present

## 2022-07-12 DIAGNOSIS — N186 End stage renal disease: Secondary | ICD-10-CM | POA: Diagnosis not present

## 2022-07-12 DIAGNOSIS — Z992 Dependence on renal dialysis: Secondary | ICD-10-CM | POA: Diagnosis not present

## 2022-07-14 DIAGNOSIS — N186 End stage renal disease: Secondary | ICD-10-CM | POA: Diagnosis not present

## 2022-07-14 DIAGNOSIS — Z992 Dependence on renal dialysis: Secondary | ICD-10-CM | POA: Diagnosis not present

## 2022-07-14 DIAGNOSIS — N2581 Secondary hyperparathyroidism of renal origin: Secondary | ICD-10-CM | POA: Diagnosis not present

## 2022-07-16 DIAGNOSIS — Z992 Dependence on renal dialysis: Secondary | ICD-10-CM | POA: Diagnosis not present

## 2022-07-16 DIAGNOSIS — N2581 Secondary hyperparathyroidism of renal origin: Secondary | ICD-10-CM | POA: Diagnosis not present

## 2022-07-16 DIAGNOSIS — N186 End stage renal disease: Secondary | ICD-10-CM | POA: Diagnosis not present

## 2022-07-18 DIAGNOSIS — Z992 Dependence on renal dialysis: Secondary | ICD-10-CM | POA: Diagnosis not present

## 2022-07-18 DIAGNOSIS — N186 End stage renal disease: Secondary | ICD-10-CM | POA: Diagnosis not present

## 2022-07-18 DIAGNOSIS — N2581 Secondary hyperparathyroidism of renal origin: Secondary | ICD-10-CM | POA: Diagnosis not present

## 2022-07-20 DIAGNOSIS — N2581 Secondary hyperparathyroidism of renal origin: Secondary | ICD-10-CM | POA: Diagnosis not present

## 2022-07-20 DIAGNOSIS — N186 End stage renal disease: Secondary | ICD-10-CM | POA: Diagnosis not present

## 2022-07-20 DIAGNOSIS — Z992 Dependence on renal dialysis: Secondary | ICD-10-CM | POA: Diagnosis not present

## 2022-07-23 DIAGNOSIS — Z992 Dependence on renal dialysis: Secondary | ICD-10-CM | POA: Diagnosis not present

## 2022-07-23 DIAGNOSIS — N186 End stage renal disease: Secondary | ICD-10-CM | POA: Diagnosis not present

## 2022-07-23 DIAGNOSIS — N2581 Secondary hyperparathyroidism of renal origin: Secondary | ICD-10-CM | POA: Diagnosis not present

## 2022-07-26 DIAGNOSIS — N2581 Secondary hyperparathyroidism of renal origin: Secondary | ICD-10-CM | POA: Diagnosis not present

## 2022-07-26 DIAGNOSIS — N186 End stage renal disease: Secondary | ICD-10-CM | POA: Diagnosis not present

## 2022-07-26 DIAGNOSIS — Z992 Dependence on renal dialysis: Secondary | ICD-10-CM | POA: Diagnosis not present

## 2022-07-28 DIAGNOSIS — Z992 Dependence on renal dialysis: Secondary | ICD-10-CM | POA: Diagnosis not present

## 2022-07-28 DIAGNOSIS — N2581 Secondary hyperparathyroidism of renal origin: Secondary | ICD-10-CM | POA: Diagnosis not present

## 2022-07-28 DIAGNOSIS — N186 End stage renal disease: Secondary | ICD-10-CM | POA: Diagnosis not present

## 2022-07-29 ENCOUNTER — Other Ambulatory Visit: Payer: Medicare HMO

## 2022-07-29 DIAGNOSIS — Z87898 Personal history of other specified conditions: Secondary | ICD-10-CM | POA: Diagnosis not present

## 2022-07-29 DIAGNOSIS — Z125 Encounter for screening for malignant neoplasm of prostate: Secondary | ICD-10-CM | POA: Diagnosis not present

## 2022-07-30 DIAGNOSIS — N186 End stage renal disease: Secondary | ICD-10-CM | POA: Diagnosis not present

## 2022-07-30 DIAGNOSIS — N2581 Secondary hyperparathyroidism of renal origin: Secondary | ICD-10-CM | POA: Diagnosis not present

## 2022-07-30 DIAGNOSIS — Z992 Dependence on renal dialysis: Secondary | ICD-10-CM | POA: Diagnosis not present

## 2022-07-30 LAB — PSA: Prostate Specific Ag, Serum: 0.6 ng/mL (ref 0.0–4.0)

## 2022-08-01 ENCOUNTER — Ambulatory Visit: Payer: Medicare HMO | Admitting: Urology

## 2022-08-02 DIAGNOSIS — Z992 Dependence on renal dialysis: Secondary | ICD-10-CM | POA: Diagnosis not present

## 2022-08-02 DIAGNOSIS — N2581 Secondary hyperparathyroidism of renal origin: Secondary | ICD-10-CM | POA: Diagnosis not present

## 2022-08-02 DIAGNOSIS — N186 End stage renal disease: Secondary | ICD-10-CM | POA: Diagnosis not present

## 2022-08-03 ENCOUNTER — Encounter: Payer: Self-pay | Admitting: Urology

## 2022-08-03 ENCOUNTER — Ambulatory Visit (INDEPENDENT_AMBULATORY_CARE_PROVIDER_SITE_OTHER): Payer: Medicare HMO | Admitting: Urology

## 2022-08-03 VITALS — BP 113/68 | HR 73 | Ht 63.0 in | Wt 256.0 lb

## 2022-08-03 DIAGNOSIS — R339 Retention of urine, unspecified: Secondary | ICD-10-CM | POA: Diagnosis not present

## 2022-08-03 DIAGNOSIS — N401 Enlarged prostate with lower urinary tract symptoms: Secondary | ICD-10-CM | POA: Diagnosis not present

## 2022-08-03 DIAGNOSIS — R972 Elevated prostate specific antigen [PSA]: Secondary | ICD-10-CM

## 2022-08-03 DIAGNOSIS — Z6841 Body Mass Index (BMI) 40.0 and over, adult: Secondary | ICD-10-CM | POA: Diagnosis not present

## 2022-08-03 DIAGNOSIS — Z87898 Personal history of other specified conditions: Secondary | ICD-10-CM

## 2022-08-03 MED ORDER — TAMSULOSIN HCL 0.4 MG PO CAPS: 0.4000 mg | ORAL_CAPSULE | Freq: Every day | ORAL | 3 refills | Status: DC

## 2022-08-03 MED ORDER — DUTASTERIDE 0.5 MG PO CAPS: 0.5000 mg | ORAL_CAPSULE | Freq: Every day | ORAL | 3 refills | Status: DC

## 2022-08-03 NOTE — Progress Notes (Signed)
08/03/2022 1:29 PM   Clent Demark Molden II 1971-03-18 [id number]  Referring provider: Jodi Marble, MD [address] [place],  [place] [address]  Chief Complaint  Patient presents with   Follow-up    Urologic history:  Hospitalized 03/2020 with acute on chronic renal failure; found to be in urinary retention PSA 26.65 after Foley catheter placement Subsequent PSA has been normal CT estimated 66 g prostate Started on tamsulosin/dutasteride PSA returned normal   HPI: 51 y.o. male presents for annual follow-up.  He presents today with his father who provided history  No problems since last visit PSA 07/29/2022 was 0.6 (uncorrected) No dysuria or gross hematuria   PMH: Past Medical History:  Diagnosis Date   Autism    Chronic kidney disease    Hypertension    Murmur    OCD (obsessive compulsive disorder)     Surgical History: Past Surgical History:  Procedure Laterality Date   A/V FISTULAGRAM Left 10/07/2020   Procedure: A/V FISTULAGRAM;  Surgeon: Katha Cabal, MD;  Location: [facility];  Service: Cardiovascular;  Laterality: Left;   AV FISTULA PLACEMENT Left 07/15/2020   Procedure: ARTERIOVENOUS FISTULA CREATION;  Surgeon: Katha Cabal, MD;  Location: [facility];  Service: Vascular;  Laterality: Left;   DIALYSIS/PERMA CATHETER INSERTION N/A 04/06/2020   Procedure: DIALYSIS/PERMA CATHETER INSERTION;  Surgeon: Algernon Huxley, MD;  Location: [facility];  Service: Cardiovascular;  Laterality: N/A;   DIALYSIS/PERMA CATHETER INSERTION N/A 05/26/2020   Procedure: DIALYSIS/PERMA CATHETER INSERTION;  Surgeon: Katha Cabal, MD;  Location: [facility];  Service: Cardiovascular;  Laterality: N/A;   DIALYSIS/PERMA CATHETER INSERTION N/A 07/09/2020   Procedure: DIALYSIS/PERMA CATHETER INSERTION;  Surgeon: Algernon Huxley, MD;  Location: [facility];  Service: Cardiovascular;  Laterality: N/A;   DIALYSIS/PERMA CATHETER INSERTION N/A  01/08/2021   Procedure: DIALYSIS/PERMA CATHETER INSERTION;  Surgeon: Algernon Huxley, MD;  Location: [facility];  Service: Cardiovascular;  Laterality: N/A;   DIALYSIS/PERMA CATHETER REMOVAL N/A 03/15/2021   Procedure: DIALYSIS/PERMA CATHETER REMOVAL;  Surgeon: Algernon Huxley, MD;  Location: [facility];  Service: Cardiovascular;  Laterality: N/A;   REVISON OF ARTERIOVENOUS FISTULA Left 12/11/2020   Procedure: ARTERIOVENOUS (AV) FISTULA CREATION ( BRACHIAL CEPHALIC );  Surgeon: Katha Cabal, MD;  Location: [facility];  Service: Vascular;  Laterality: Left;    Home Medications:  Allergies as of 08/03/2022       Reactions   Chlorhexidine Other (See Comments)   Blisters (topical)        Medication List        Accurate as of August 03, 2022  1:29 PM. If you have any questions, ask your nurse or doctor.          calcium acetate 667 MG capsule Commonly known as: PHOSLO   dutasteride 0.5 MG capsule Commonly known as: AVODART Take 1 capsule (0.5 mg total) by mouth daily.   midodrine 5 MG tablet Commonly known as: PROAMATINE Take 5 mg by mouth daily as needed.   RENA-VITE PO Take 1 tablet by mouth daily.   multivitamin Tabs tablet Take by mouth.   tamsulosin 0.4 MG Caps capsule Commonly known as: FLOMAX Take 1 capsule (0.4 mg total) by mouth daily.   Vitamin D-3 125 MCG (5000 UT) Tabs Take 5,000 Units by mouth daily.        Allergies:  Allergies  Allergen Reactions   Chlorhexidine Other (See Comments)  Blisters (topical)    Family History: History reviewed. No pertinent family history.  Social History:  reports that he has never smoked. He has never used smokeless tobacco. He reports that he does not drink alcohol and does not use drugs.   Physical Exam: BP 113/68   Pulse 73   Ht 5\' 3"  (1.6 m)   Wt 256 lb (116.1 kg)   BMI 45.35 kg/m   Constitutional:  Alert, No acute distress. HEENT: [place] AT, moist mucus membranes.  Trachea midline,  no masses. Cardiovascular: No clubbing, cyanosis, or edema. Respiratory: Normal respiratory effort, no increased work of breathing.    Assessment & Plan:    1.  History elevated PSA Uncorrected PSA 0.8 Continue annual follow-up  2.  BPH with history of urinary retention Tamsulosin/dutasteride refill   Abbie Sons, MD  [facility] [place] [address], [place], [place] [address] [phone]

## 2022-08-04 DIAGNOSIS — N186 End stage renal disease: Secondary | ICD-10-CM | POA: Diagnosis not present

## 2022-08-04 DIAGNOSIS — N2581 Secondary hyperparathyroidism of renal origin: Secondary | ICD-10-CM | POA: Diagnosis not present

## 2022-08-04 DIAGNOSIS — Z992 Dependence on renal dialysis: Secondary | ICD-10-CM | POA: Diagnosis not present

## 2022-08-06 DIAGNOSIS — N2581 Secondary hyperparathyroidism of renal origin: Secondary | ICD-10-CM | POA: Diagnosis not present

## 2022-08-06 DIAGNOSIS — Z992 Dependence on renal dialysis: Secondary | ICD-10-CM | POA: Diagnosis not present

## 2022-08-06 DIAGNOSIS — N186 End stage renal disease: Secondary | ICD-10-CM | POA: Diagnosis not present

## 2022-08-08 DIAGNOSIS — E782 Mixed hyperlipidemia: Secondary | ICD-10-CM | POA: Diagnosis not present

## 2022-08-08 DIAGNOSIS — N186 End stage renal disease: Secondary | ICD-10-CM | POA: Diagnosis not present

## 2022-08-08 DIAGNOSIS — F840 Autistic disorder: Secondary | ICD-10-CM | POA: Diagnosis not present

## 2022-08-08 DIAGNOSIS — E559 Vitamin D deficiency, unspecified: Secondary | ICD-10-CM | POA: Diagnosis not present

## 2022-08-08 DIAGNOSIS — I10 Essential (primary) hypertension: Secondary | ICD-10-CM | POA: Diagnosis not present

## 2022-08-09 DIAGNOSIS — N2581 Secondary hyperparathyroidism of renal origin: Secondary | ICD-10-CM | POA: Diagnosis not present

## 2022-08-09 DIAGNOSIS — Z992 Dependence on renal dialysis: Secondary | ICD-10-CM | POA: Diagnosis not present

## 2022-08-09 DIAGNOSIS — N186 End stage renal disease: Secondary | ICD-10-CM | POA: Diagnosis not present

## 2022-08-11 ENCOUNTER — Other Ambulatory Visit (INDEPENDENT_AMBULATORY_CARE_PROVIDER_SITE_OTHER): Payer: Self-pay | Admitting: Vascular Surgery

## 2022-08-11 DIAGNOSIS — N2581 Secondary hyperparathyroidism of renal origin: Secondary | ICD-10-CM | POA: Diagnosis not present

## 2022-08-11 DIAGNOSIS — Z992 Dependence on renal dialysis: Secondary | ICD-10-CM | POA: Diagnosis not present

## 2022-08-11 DIAGNOSIS — N186 End stage renal disease: Secondary | ICD-10-CM

## 2022-08-13 DIAGNOSIS — N186 End stage renal disease: Secondary | ICD-10-CM | POA: Diagnosis not present

## 2022-08-13 DIAGNOSIS — Z992 Dependence on renal dialysis: Secondary | ICD-10-CM | POA: Diagnosis not present

## 2022-08-13 DIAGNOSIS — N2581 Secondary hyperparathyroidism of renal origin: Secondary | ICD-10-CM | POA: Diagnosis not present

## 2022-08-14 NOTE — Progress Notes (Unsigned)
MRN : [id number]  Joseph Gilmore is a 51 y.o. (24-Dec-1970) male who presents with chief complaint of check access.  History of Present Illness:  The patient returns to the office for followup status post intervention of the dialysis access 12/10/2020. Following the intervention the access function has significantly improved, with better flow rates and improved KT/V. The patient has not been experiencing increased bleeding times following decannulation and the patient denies increased recirculation. The patient denies an increase in arm swelling. At the present time the patient denies hand pain.   The patient denies amaurosis fugax or recent TIA symptoms. There are no recent neurological changes noted. The patient denies claudication symptoms or rest pain symptoms. The patient denies history of DVT, PE or superficial thrombophlebitis. The patient denies recent episodes of angina or shortness of breath.    Duplex ultrasound of the AV access shows a patent access.  The previously noted stenosis is unchanged compared to last study.    No outpatient medications have been marked as taking for the 08/15/22 encounter (Appointment) with Delana Meyer, Dolores Lory, MD.    Past Medical History:  Diagnosis Date   Autism    Chronic kidney disease    Hypertension    Murmur    OCD (obsessive compulsive disorder)     Past Surgical History:  Procedure Laterality Date   A/V FISTULAGRAM Left 10/07/2020   Procedure: A/V FISTULAGRAM;  Surgeon: Katha Cabal, MD;  Location: [facility];  Service: Cardiovascular;  Laterality: Left;   AV FISTULA PLACEMENT Left 07/15/2020   Procedure: ARTERIOVENOUS FISTULA CREATION;  Surgeon: Katha Cabal, MD;  Location: [facility];  Service: Vascular;  Laterality: Left;   DIALYSIS/PERMA CATHETER INSERTION N/A 04/06/2020   Procedure: DIALYSIS/PERMA CATHETER INSERTION;  Surgeon: Algernon Huxley, MD;  Location: [facility];  Service:  Cardiovascular;  Laterality: N/A;   DIALYSIS/PERMA CATHETER INSERTION N/A 05/26/2020   Procedure: DIALYSIS/PERMA CATHETER INSERTION;  Surgeon: Katha Cabal, MD;  Location: [facility];  Service: Cardiovascular;  Laterality: N/A;   DIALYSIS/PERMA CATHETER INSERTION N/A 07/09/2020   Procedure: DIALYSIS/PERMA CATHETER INSERTION;  Surgeon: Algernon Huxley, MD;  Location: [facility];  Service: Cardiovascular;  Laterality: N/A;   DIALYSIS/PERMA CATHETER INSERTION N/A 01/08/2021   Procedure: DIALYSIS/PERMA CATHETER INSERTION;  Surgeon: Algernon Huxley, MD;  Location: [facility];  Service: Cardiovascular;  Laterality: N/A;   DIALYSIS/PERMA CATHETER REMOVAL N/A 03/15/2021   Procedure: DIALYSIS/PERMA CATHETER REMOVAL;  Surgeon: Algernon Huxley, MD;  Location: [facility];  Service: Cardiovascular;  Laterality: N/A;   REVISON OF ARTERIOVENOUS FISTULA Left 12/11/2020   Procedure: ARTERIOVENOUS (AV) FISTULA CREATION ( BRACHIAL CEPHALIC );  Surgeon: Katha Cabal, MD;  Location: [facility];  Service: Vascular;  Laterality: Left;    Social History Social History   Tobacco Use   Smoking status: Never   Smokeless tobacco: Never  Vaping Use   Vaping Use: Never used  Substance Use Topics   Alcohol use: Never   Drug use: Never    Family History No family history on file.  Allergies  Allergen Reactions   Chlorhexidine Other (See Comments)    Blisters (topical)     REVIEW OF SYSTEMS (Negative unless checked)  Constitutional: [] Weight loss  [] Fever  [] Chills Cardiac: [] Chest pain   [] Chest pressure   []   Palpitations   [] Shortness of breath when laying flat   [] Shortness of breath with exertion. Vascular:  [] Pain in legs with walking   [] Pain in legs at rest  [] History of DVT   [] Phlebitis   [] Swelling in legs   [] Varicose veins   [] Non-healing ulcers Pulmonary:   [] Uses home oxygen   [] Productive cough   [] Hemoptysis   [] Wheeze  [] COPD   [] Asthma Neurologic:   [] Dizziness   [] Seizures   [] History of stroke   [] History of TIA  [] Aphasia   [] Vissual changes   [] Weakness or numbness in arm   [] Weakness or numbness in leg Musculoskeletal:   [] Joint swelling   [] Joint pain   [] Low back pain Hematologic:  [] Easy bruising  [] Easy bleeding   [] Hypercoagulable state   [] Anemic Gastrointestinal:  [] Diarrhea   [] Vomiting  [] Gastroesophageal reflux/heartburn   [] Difficulty swallowing. Genitourinary:  [x] Chronic kidney disease   [] Difficult urination  [] Frequent urination   [] Blood in urine Skin:  [] Rashes   [] Ulcers  Psychological:  [] History of anxiety   []  History of major depression.  Physical Examination  There were no vitals filed for this visit. There is no height or weight on file to calculate BMI. Gen: WD/WN, NAD Head: [place]/AT, No temporalis wasting.  Ear/Nose/Throat: Hearing grossly intact, nares w/o erythema or drainage Eyes: PER, EOMI, sclera nonicteric.  Neck: Supple, no gross masses or lesions.  No JVD.  Pulmonary:  Good air movement, no audible wheezing, no use of accessory muscles.  Cardiac: RRR, precordium non-hyperdynamic. Vascular:   left av access good thrill good bruit Vessel Right Left  Radial Palpable Palpable  Brachial Palpable Palpable  Gastrointestinal: soft, non-distended. No guarding/no peritoneal signs.  Musculoskeletal: M/S 5/5 throughout.  No deformity.  Neurologic: CN 2-12 intact. Pain and light touch intact in extremities.  Symmetrical.  Speech is fluent. Motor exam as listed above. Psychiatric: Judgment intact, Mood & affect appropriate for pt's clinical situation. Dermatologic: No rashes or ulcers noted.  No changes consistent with cellulitis.   CBC Lab Results  Component Value Date   WBC 7.9 12/09/2020   HGB 11.2 (L) 12/11/2020   HCT 33.0 (L) 12/11/2020   MCV 93.6 12/09/2020   PLT 179 12/09/2020    BMET    Component Value Date/Time   NA 140 12/11/2020 0920   K 4.0 12/11/2020 0920   CL 97 (L) 12/11/2020 0920    CO2 30 12/09/2020 1012   GLUCOSE 85 12/11/2020 0920   BUN 40 (H) 12/11/2020 0920   CREATININE 12.10 (H) 12/11/2020 0920   CALCIUM 8.9 12/09/2020 1012   GFRNONAA 4 (L) 12/09/2020 1012   GFRAA 8 (L) 04/24/2020 1219   CrCl cannot be calculated (Patient's most recent lab result is older than the maximum 21 days allowed.).  COAG Lab Results  Component Value Date   INR 1.1 12/09/2020   INR 1.1 07/13/2020   INR 1.2 04/06/2020    Radiology No results found.   Assessment/Plan 1. ESRD on hemodialysis [facility]) Recommend:   The patient is doing well and currently has adequate dialysis access. The patient's dialysis center is not reporting any access issues. Flow pattern is stable when compared to the prior ultrasound.   The patient should have a duplex ultrasound of the dialysis access in 12 months. The patient will follow-up with me in the office after each ultrasound  - VAS [place] (AVF, AVG); Future  2. Essential (primary) hypertension Continue antihypertensive medications as already ordered, these medications have been  reviewed and there are no changes at this time.    [name], MD  08/14/2022 3:18 PM

## 2022-08-15 ENCOUNTER — Encounter (INDEPENDENT_AMBULATORY_CARE_PROVIDER_SITE_OTHER): Payer: Self-pay | Admitting: Vascular Surgery

## 2022-08-15 ENCOUNTER — Ambulatory Visit (INDEPENDENT_AMBULATORY_CARE_PROVIDER_SITE_OTHER): Payer: Medicare HMO | Admitting: Vascular Surgery

## 2022-08-15 ENCOUNTER — Ambulatory Visit (INDEPENDENT_AMBULATORY_CARE_PROVIDER_SITE_OTHER): Payer: Medicare HMO

## 2022-08-15 VITALS — BP 124/68 | HR 75 | Wt 256.0 lb

## 2022-08-15 DIAGNOSIS — I10 Essential (primary) hypertension: Secondary | ICD-10-CM

## 2022-08-15 DIAGNOSIS — N186 End stage renal disease: Secondary | ICD-10-CM | POA: Diagnosis not present

## 2022-08-15 DIAGNOSIS — Z992 Dependence on renal dialysis: Secondary | ICD-10-CM | POA: Diagnosis not present

## 2022-08-16 ENCOUNTER — Encounter (INDEPENDENT_AMBULATORY_CARE_PROVIDER_SITE_OTHER): Payer: Self-pay | Admitting: Vascular Surgery

## 2022-08-16 DIAGNOSIS — N2581 Secondary hyperparathyroidism of renal origin: Secondary | ICD-10-CM | POA: Diagnosis not present

## 2022-08-16 DIAGNOSIS — N186 End stage renal disease: Secondary | ICD-10-CM | POA: Diagnosis not present

## 2022-08-16 DIAGNOSIS — Z992 Dependence on renal dialysis: Secondary | ICD-10-CM | POA: Diagnosis not present

## 2022-08-18 DIAGNOSIS — N186 End stage renal disease: Secondary | ICD-10-CM | POA: Diagnosis not present

## 2022-08-18 DIAGNOSIS — N2581 Secondary hyperparathyroidism of renal origin: Secondary | ICD-10-CM | POA: Diagnosis not present

## 2022-08-18 DIAGNOSIS — Z992 Dependence on renal dialysis: Secondary | ICD-10-CM | POA: Diagnosis not present

## 2022-08-20 DIAGNOSIS — N186 End stage renal disease: Secondary | ICD-10-CM | POA: Diagnosis not present

## 2022-08-20 DIAGNOSIS — Z992 Dependence on renal dialysis: Secondary | ICD-10-CM | POA: Diagnosis not present

## 2022-08-20 DIAGNOSIS — N2581 Secondary hyperparathyroidism of renal origin: Secondary | ICD-10-CM | POA: Diagnosis not present

## 2022-08-23 DIAGNOSIS — N186 End stage renal disease: Secondary | ICD-10-CM | POA: Diagnosis not present

## 2022-08-23 DIAGNOSIS — Z992 Dependence on renal dialysis: Secondary | ICD-10-CM | POA: Diagnosis not present

## 2022-08-23 DIAGNOSIS — N2581 Secondary hyperparathyroidism of renal origin: Secondary | ICD-10-CM | POA: Diagnosis not present

## 2022-08-25 DIAGNOSIS — N186 End stage renal disease: Secondary | ICD-10-CM | POA: Diagnosis not present

## 2022-08-25 DIAGNOSIS — Z992 Dependence on renal dialysis: Secondary | ICD-10-CM | POA: Diagnosis not present

## 2022-08-25 DIAGNOSIS — N2581 Secondary hyperparathyroidism of renal origin: Secondary | ICD-10-CM | POA: Diagnosis not present

## 2022-08-27 DIAGNOSIS — N2581 Secondary hyperparathyroidism of renal origin: Secondary | ICD-10-CM | POA: Diagnosis not present

## 2022-08-27 DIAGNOSIS — Z992 Dependence on renal dialysis: Secondary | ICD-10-CM | POA: Diagnosis not present

## 2022-08-27 DIAGNOSIS — N186 End stage renal disease: Secondary | ICD-10-CM | POA: Diagnosis not present

## 2022-08-28 DIAGNOSIS — Z992 Dependence on renal dialysis: Secondary | ICD-10-CM | POA: Diagnosis not present

## 2022-08-28 DIAGNOSIS — N186 End stage renal disease: Secondary | ICD-10-CM | POA: Diagnosis not present

## 2022-08-30 DIAGNOSIS — Z992 Dependence on renal dialysis: Secondary | ICD-10-CM | POA: Diagnosis not present

## 2022-08-30 DIAGNOSIS — N2581 Secondary hyperparathyroidism of renal origin: Secondary | ICD-10-CM | POA: Diagnosis not present

## 2022-08-30 DIAGNOSIS — N186 End stage renal disease: Secondary | ICD-10-CM | POA: Diagnosis not present

## 2022-09-01 DIAGNOSIS — N2581 Secondary hyperparathyroidism of renal origin: Secondary | ICD-10-CM | POA: Diagnosis not present

## 2022-09-01 DIAGNOSIS — N186 End stage renal disease: Secondary | ICD-10-CM | POA: Diagnosis not present

## 2022-09-01 DIAGNOSIS — Z992 Dependence on renal dialysis: Secondary | ICD-10-CM | POA: Diagnosis not present

## 2022-09-03 DIAGNOSIS — N186 End stage renal disease: Secondary | ICD-10-CM | POA: Diagnosis not present

## 2022-09-03 DIAGNOSIS — Z992 Dependence on renal dialysis: Secondary | ICD-10-CM | POA: Diagnosis not present

## 2022-09-03 DIAGNOSIS — N2581 Secondary hyperparathyroidism of renal origin: Secondary | ICD-10-CM | POA: Diagnosis not present

## 2022-09-06 DIAGNOSIS — N2581 Secondary hyperparathyroidism of renal origin: Secondary | ICD-10-CM | POA: Diagnosis not present

## 2022-09-06 DIAGNOSIS — Z992 Dependence on renal dialysis: Secondary | ICD-10-CM | POA: Diagnosis not present

## 2022-09-06 DIAGNOSIS — N186 End stage renal disease: Secondary | ICD-10-CM | POA: Diagnosis not present

## 2022-09-08 DIAGNOSIS — N186 End stage renal disease: Secondary | ICD-10-CM | POA: Diagnosis not present

## 2022-09-08 DIAGNOSIS — Z992 Dependence on renal dialysis: Secondary | ICD-10-CM | POA: Diagnosis not present

## 2022-09-08 DIAGNOSIS — N2581 Secondary hyperparathyroidism of renal origin: Secondary | ICD-10-CM | POA: Diagnosis not present

## 2022-09-10 DIAGNOSIS — N186 End stage renal disease: Secondary | ICD-10-CM | POA: Diagnosis not present

## 2022-09-10 DIAGNOSIS — Z992 Dependence on renal dialysis: Secondary | ICD-10-CM | POA: Diagnosis not present

## 2022-09-10 DIAGNOSIS — N2581 Secondary hyperparathyroidism of renal origin: Secondary | ICD-10-CM | POA: Diagnosis not present

## 2022-09-13 DIAGNOSIS — N186 End stage renal disease: Secondary | ICD-10-CM | POA: Diagnosis not present

## 2022-09-13 DIAGNOSIS — Z992 Dependence on renal dialysis: Secondary | ICD-10-CM | POA: Diagnosis not present

## 2022-09-13 DIAGNOSIS — N2581 Secondary hyperparathyroidism of renal origin: Secondary | ICD-10-CM | POA: Diagnosis not present

## 2022-09-15 DIAGNOSIS — N2581 Secondary hyperparathyroidism of renal origin: Secondary | ICD-10-CM | POA: Diagnosis not present

## 2022-09-15 DIAGNOSIS — N186 End stage renal disease: Secondary | ICD-10-CM | POA: Diagnosis not present

## 2022-09-15 DIAGNOSIS — Z992 Dependence on renal dialysis: Secondary | ICD-10-CM | POA: Diagnosis not present

## 2022-09-17 DIAGNOSIS — N2581 Secondary hyperparathyroidism of renal origin: Secondary | ICD-10-CM | POA: Diagnosis not present

## 2022-09-17 DIAGNOSIS — Z992 Dependence on renal dialysis: Secondary | ICD-10-CM | POA: Diagnosis not present

## 2022-09-17 DIAGNOSIS — N186 End stage renal disease: Secondary | ICD-10-CM | POA: Diagnosis not present

## 2022-09-18 DIAGNOSIS — Z1212 Encounter for screening for malignant neoplasm of rectum: Secondary | ICD-10-CM | POA: Diagnosis not present

## 2022-09-18 DIAGNOSIS — Z1211 Encounter for screening for malignant neoplasm of colon: Secondary | ICD-10-CM | POA: Diagnosis not present

## 2022-09-20 DIAGNOSIS — N186 End stage renal disease: Secondary | ICD-10-CM | POA: Diagnosis not present

## 2022-09-20 DIAGNOSIS — Z992 Dependence on renal dialysis: Secondary | ICD-10-CM | POA: Diagnosis not present

## 2022-09-20 DIAGNOSIS — N2581 Secondary hyperparathyroidism of renal origin: Secondary | ICD-10-CM | POA: Diagnosis not present

## 2022-09-22 DIAGNOSIS — N2581 Secondary hyperparathyroidism of renal origin: Secondary | ICD-10-CM | POA: Diagnosis not present

## 2022-09-22 DIAGNOSIS — N186 End stage renal disease: Secondary | ICD-10-CM | POA: Diagnosis not present

## 2022-09-22 DIAGNOSIS — Z992 Dependence on renal dialysis: Secondary | ICD-10-CM | POA: Diagnosis not present

## 2022-09-24 DIAGNOSIS — N2581 Secondary hyperparathyroidism of renal origin: Secondary | ICD-10-CM | POA: Diagnosis not present

## 2022-09-24 DIAGNOSIS — N186 End stage renal disease: Secondary | ICD-10-CM | POA: Diagnosis not present

## 2022-09-24 DIAGNOSIS — Z992 Dependence on renal dialysis: Secondary | ICD-10-CM | POA: Diagnosis not present

## 2022-09-26 LAB — COLOGUARD: COLOGUARD: NEGATIVE

## 2022-09-27 DIAGNOSIS — Z992 Dependence on renal dialysis: Secondary | ICD-10-CM | POA: Diagnosis not present

## 2022-09-27 DIAGNOSIS — N186 End stage renal disease: Secondary | ICD-10-CM | POA: Diagnosis not present

## 2022-09-27 DIAGNOSIS — N2581 Secondary hyperparathyroidism of renal origin: Secondary | ICD-10-CM | POA: Diagnosis not present

## 2022-09-28 DIAGNOSIS — Z992 Dependence on renal dialysis: Secondary | ICD-10-CM | POA: Diagnosis not present

## 2022-09-28 DIAGNOSIS — N186 End stage renal disease: Secondary | ICD-10-CM | POA: Diagnosis not present

## 2022-09-29 DIAGNOSIS — N186 End stage renal disease: Secondary | ICD-10-CM | POA: Diagnosis not present

## 2022-09-29 DIAGNOSIS — N2581 Secondary hyperparathyroidism of renal origin: Secondary | ICD-10-CM | POA: Diagnosis not present

## 2022-09-29 DIAGNOSIS — Z992 Dependence on renal dialysis: Secondary | ICD-10-CM | POA: Diagnosis not present

## 2022-10-01 DIAGNOSIS — Z992 Dependence on renal dialysis: Secondary | ICD-10-CM | POA: Diagnosis not present

## 2022-10-01 DIAGNOSIS — N186 End stage renal disease: Secondary | ICD-10-CM | POA: Diagnosis not present

## 2022-10-01 DIAGNOSIS — N2581 Secondary hyperparathyroidism of renal origin: Secondary | ICD-10-CM | POA: Diagnosis not present

## 2022-10-04 DIAGNOSIS — N2581 Secondary hyperparathyroidism of renal origin: Secondary | ICD-10-CM | POA: Diagnosis not present

## 2022-10-04 DIAGNOSIS — Z992 Dependence on renal dialysis: Secondary | ICD-10-CM | POA: Diagnosis not present

## 2022-10-04 DIAGNOSIS — N186 End stage renal disease: Secondary | ICD-10-CM | POA: Diagnosis not present

## 2022-10-06 DIAGNOSIS — Z992 Dependence on renal dialysis: Secondary | ICD-10-CM | POA: Diagnosis not present

## 2022-10-06 DIAGNOSIS — N186 End stage renal disease: Secondary | ICD-10-CM | POA: Diagnosis not present

## 2022-10-06 DIAGNOSIS — N2581 Secondary hyperparathyroidism of renal origin: Secondary | ICD-10-CM | POA: Diagnosis not present

## 2022-10-08 DIAGNOSIS — Z992 Dependence on renal dialysis: Secondary | ICD-10-CM | POA: Diagnosis not present

## 2022-10-08 DIAGNOSIS — N186 End stage renal disease: Secondary | ICD-10-CM | POA: Diagnosis not present

## 2022-10-08 DIAGNOSIS — N2581 Secondary hyperparathyroidism of renal origin: Secondary | ICD-10-CM | POA: Diagnosis not present

## 2022-10-11 DIAGNOSIS — N186 End stage renal disease: Secondary | ICD-10-CM | POA: Diagnosis not present

## 2022-10-11 DIAGNOSIS — N2581 Secondary hyperparathyroidism of renal origin: Secondary | ICD-10-CM | POA: Diagnosis not present

## 2022-10-11 DIAGNOSIS — Z992 Dependence on renal dialysis: Secondary | ICD-10-CM | POA: Diagnosis not present

## 2022-10-13 DIAGNOSIS — Z992 Dependence on renal dialysis: Secondary | ICD-10-CM | POA: Diagnosis not present

## 2022-10-13 DIAGNOSIS — N186 End stage renal disease: Secondary | ICD-10-CM | POA: Diagnosis not present

## 2022-10-13 DIAGNOSIS — N2581 Secondary hyperparathyroidism of renal origin: Secondary | ICD-10-CM | POA: Diagnosis not present

## 2022-10-15 DIAGNOSIS — Z992 Dependence on renal dialysis: Secondary | ICD-10-CM | POA: Diagnosis not present

## 2022-10-15 DIAGNOSIS — N2581 Secondary hyperparathyroidism of renal origin: Secondary | ICD-10-CM | POA: Diagnosis not present

## 2022-10-15 DIAGNOSIS — N186 End stage renal disease: Secondary | ICD-10-CM | POA: Diagnosis not present

## 2022-10-18 DIAGNOSIS — N2581 Secondary hyperparathyroidism of renal origin: Secondary | ICD-10-CM | POA: Diagnosis not present

## 2022-10-18 DIAGNOSIS — N186 End stage renal disease: Secondary | ICD-10-CM | POA: Diagnosis not present

## 2022-10-18 DIAGNOSIS — Z992 Dependence on renal dialysis: Secondary | ICD-10-CM | POA: Diagnosis not present

## 2022-10-20 DIAGNOSIS — Z992 Dependence on renal dialysis: Secondary | ICD-10-CM | POA: Diagnosis not present

## 2022-10-20 DIAGNOSIS — N186 End stage renal disease: Secondary | ICD-10-CM | POA: Diagnosis not present

## 2022-10-20 DIAGNOSIS — N2581 Secondary hyperparathyroidism of renal origin: Secondary | ICD-10-CM | POA: Diagnosis not present

## 2022-10-22 DIAGNOSIS — N186 End stage renal disease: Secondary | ICD-10-CM | POA: Diagnosis not present

## 2022-10-22 DIAGNOSIS — Z992 Dependence on renal dialysis: Secondary | ICD-10-CM | POA: Diagnosis not present

## 2022-10-22 DIAGNOSIS — N2581 Secondary hyperparathyroidism of renal origin: Secondary | ICD-10-CM | POA: Diagnosis not present

## 2022-10-25 DIAGNOSIS — N2581 Secondary hyperparathyroidism of renal origin: Secondary | ICD-10-CM | POA: Diagnosis not present

## 2022-10-25 DIAGNOSIS — N186 End stage renal disease: Secondary | ICD-10-CM | POA: Diagnosis not present

## 2022-10-25 DIAGNOSIS — Z992 Dependence on renal dialysis: Secondary | ICD-10-CM | POA: Diagnosis not present

## 2022-10-27 DIAGNOSIS — N186 End stage renal disease: Secondary | ICD-10-CM | POA: Diagnosis not present

## 2022-10-27 DIAGNOSIS — N2581 Secondary hyperparathyroidism of renal origin: Secondary | ICD-10-CM | POA: Diagnosis not present

## 2022-10-27 DIAGNOSIS — Z992 Dependence on renal dialysis: Secondary | ICD-10-CM | POA: Diagnosis not present

## 2022-10-29 DIAGNOSIS — Z992 Dependence on renal dialysis: Secondary | ICD-10-CM | POA: Diagnosis not present

## 2022-10-29 DIAGNOSIS — N2581 Secondary hyperparathyroidism of renal origin: Secondary | ICD-10-CM | POA: Diagnosis not present

## 2022-10-29 DIAGNOSIS — N186 End stage renal disease: Secondary | ICD-10-CM | POA: Diagnosis not present

## 2022-11-01 DIAGNOSIS — N2581 Secondary hyperparathyroidism of renal origin: Secondary | ICD-10-CM | POA: Diagnosis not present

## 2022-11-01 DIAGNOSIS — Z992 Dependence on renal dialysis: Secondary | ICD-10-CM | POA: Diagnosis not present

## 2022-11-01 DIAGNOSIS — N186 End stage renal disease: Secondary | ICD-10-CM | POA: Diagnosis not present

## 2022-11-03 DIAGNOSIS — N186 End stage renal disease: Secondary | ICD-10-CM | POA: Diagnosis not present

## 2022-11-03 DIAGNOSIS — N2581 Secondary hyperparathyroidism of renal origin: Secondary | ICD-10-CM | POA: Diagnosis not present

## 2022-11-03 DIAGNOSIS — Z992 Dependence on renal dialysis: Secondary | ICD-10-CM | POA: Diagnosis not present

## 2022-11-05 DIAGNOSIS — N2581 Secondary hyperparathyroidism of renal origin: Secondary | ICD-10-CM | POA: Diagnosis not present

## 2022-11-05 DIAGNOSIS — N186 End stage renal disease: Secondary | ICD-10-CM | POA: Diagnosis not present

## 2022-11-05 DIAGNOSIS — Z992 Dependence on renal dialysis: Secondary | ICD-10-CM | POA: Diagnosis not present

## 2022-11-08 DIAGNOSIS — N2581 Secondary hyperparathyroidism of renal origin: Secondary | ICD-10-CM | POA: Diagnosis not present

## 2022-11-08 DIAGNOSIS — Z992 Dependence on renal dialysis: Secondary | ICD-10-CM | POA: Diagnosis not present

## 2022-11-08 DIAGNOSIS — N186 End stage renal disease: Secondary | ICD-10-CM | POA: Diagnosis not present

## 2022-11-10 DIAGNOSIS — N186 End stage renal disease: Secondary | ICD-10-CM | POA: Diagnosis not present

## 2022-11-10 DIAGNOSIS — N2581 Secondary hyperparathyroidism of renal origin: Secondary | ICD-10-CM | POA: Diagnosis not present

## 2022-11-10 DIAGNOSIS — Z992 Dependence on renal dialysis: Secondary | ICD-10-CM | POA: Diagnosis not present

## 2022-11-12 DIAGNOSIS — Z992 Dependence on renal dialysis: Secondary | ICD-10-CM | POA: Diagnosis not present

## 2022-11-12 DIAGNOSIS — N186 End stage renal disease: Secondary | ICD-10-CM | POA: Diagnosis not present

## 2022-11-12 DIAGNOSIS — N2581 Secondary hyperparathyroidism of renal origin: Secondary | ICD-10-CM | POA: Diagnosis not present

## 2022-11-15 DIAGNOSIS — Z992 Dependence on renal dialysis: Secondary | ICD-10-CM | POA: Diagnosis not present

## 2022-11-15 DIAGNOSIS — N186 End stage renal disease: Secondary | ICD-10-CM | POA: Diagnosis not present

## 2022-11-15 DIAGNOSIS — N2581 Secondary hyperparathyroidism of renal origin: Secondary | ICD-10-CM | POA: Diagnosis not present

## 2022-11-17 DIAGNOSIS — Z992 Dependence on renal dialysis: Secondary | ICD-10-CM | POA: Diagnosis not present

## 2022-11-17 DIAGNOSIS — N186 End stage renal disease: Secondary | ICD-10-CM | POA: Diagnosis not present

## 2022-11-17 DIAGNOSIS — N2581 Secondary hyperparathyroidism of renal origin: Secondary | ICD-10-CM | POA: Diagnosis not present

## 2022-11-19 DIAGNOSIS — N2581 Secondary hyperparathyroidism of renal origin: Secondary | ICD-10-CM | POA: Diagnosis not present

## 2022-11-19 DIAGNOSIS — N186 End stage renal disease: Secondary | ICD-10-CM | POA: Diagnosis not present

## 2022-11-19 DIAGNOSIS — Z992 Dependence on renal dialysis: Secondary | ICD-10-CM | POA: Diagnosis not present

## 2022-11-22 DIAGNOSIS — N186 End stage renal disease: Secondary | ICD-10-CM | POA: Diagnosis not present

## 2022-11-22 DIAGNOSIS — Z992 Dependence on renal dialysis: Secondary | ICD-10-CM | POA: Diagnosis not present

## 2022-11-22 DIAGNOSIS — N2581 Secondary hyperparathyroidism of renal origin: Secondary | ICD-10-CM | POA: Diagnosis not present

## 2022-11-24 DIAGNOSIS — N186 End stage renal disease: Secondary | ICD-10-CM | POA: Diagnosis not present

## 2022-11-24 DIAGNOSIS — N2581 Secondary hyperparathyroidism of renal origin: Secondary | ICD-10-CM | POA: Diagnosis not present

## 2022-11-24 DIAGNOSIS — Z992 Dependence on renal dialysis: Secondary | ICD-10-CM | POA: Diagnosis not present

## 2022-11-26 DIAGNOSIS — Z992 Dependence on renal dialysis: Secondary | ICD-10-CM | POA: Diagnosis not present

## 2022-11-26 DIAGNOSIS — N186 End stage renal disease: Secondary | ICD-10-CM | POA: Diagnosis not present

## 2022-11-26 DIAGNOSIS — N2581 Secondary hyperparathyroidism of renal origin: Secondary | ICD-10-CM | POA: Diagnosis not present

## 2022-11-27 DIAGNOSIS — Z992 Dependence on renal dialysis: Secondary | ICD-10-CM | POA: Diagnosis not present

## 2022-11-27 DIAGNOSIS — N186 End stage renal disease: Secondary | ICD-10-CM | POA: Diagnosis not present

## 2022-11-29 DIAGNOSIS — Z992 Dependence on renal dialysis: Secondary | ICD-10-CM | POA: Diagnosis not present

## 2022-11-29 DIAGNOSIS — N186 End stage renal disease: Secondary | ICD-10-CM | POA: Diagnosis not present

## 2022-11-29 DIAGNOSIS — N2581 Secondary hyperparathyroidism of renal origin: Secondary | ICD-10-CM | POA: Diagnosis not present

## 2022-12-01 DIAGNOSIS — Z992 Dependence on renal dialysis: Secondary | ICD-10-CM | POA: Diagnosis not present

## 2022-12-01 DIAGNOSIS — N2581 Secondary hyperparathyroidism of renal origin: Secondary | ICD-10-CM | POA: Diagnosis not present

## 2022-12-01 DIAGNOSIS — N186 End stage renal disease: Secondary | ICD-10-CM | POA: Diagnosis not present

## 2022-12-03 DIAGNOSIS — N186 End stage renal disease: Secondary | ICD-10-CM | POA: Diagnosis not present

## 2022-12-03 DIAGNOSIS — Z992 Dependence on renal dialysis: Secondary | ICD-10-CM | POA: Diagnosis not present

## 2022-12-03 DIAGNOSIS — N2581 Secondary hyperparathyroidism of renal origin: Secondary | ICD-10-CM | POA: Diagnosis not present

## 2022-12-06 DIAGNOSIS — Z992 Dependence on renal dialysis: Secondary | ICD-10-CM | POA: Diagnosis not present

## 2022-12-06 DIAGNOSIS — N186 End stage renal disease: Secondary | ICD-10-CM | POA: Diagnosis not present

## 2022-12-06 DIAGNOSIS — N2581 Secondary hyperparathyroidism of renal origin: Secondary | ICD-10-CM | POA: Diagnosis not present

## 2022-12-08 DIAGNOSIS — N2581 Secondary hyperparathyroidism of renal origin: Secondary | ICD-10-CM | POA: Diagnosis not present

## 2022-12-08 DIAGNOSIS — N186 End stage renal disease: Secondary | ICD-10-CM | POA: Diagnosis not present

## 2022-12-08 DIAGNOSIS — Z992 Dependence on renal dialysis: Secondary | ICD-10-CM | POA: Diagnosis not present

## 2022-12-09 ENCOUNTER — Ambulatory Visit: Payer: Medicare HMO | Admitting: Internal Medicine

## 2022-12-10 DIAGNOSIS — N2581 Secondary hyperparathyroidism of renal origin: Secondary | ICD-10-CM | POA: Diagnosis not present

## 2022-12-10 DIAGNOSIS — N186 End stage renal disease: Secondary | ICD-10-CM | POA: Diagnosis not present

## 2022-12-10 DIAGNOSIS — Z992 Dependence on renal dialysis: Secondary | ICD-10-CM | POA: Diagnosis not present

## 2022-12-13 DIAGNOSIS — N2581 Secondary hyperparathyroidism of renal origin: Secondary | ICD-10-CM | POA: Diagnosis not present

## 2022-12-13 DIAGNOSIS — N186 End stage renal disease: Secondary | ICD-10-CM | POA: Diagnosis not present

## 2022-12-13 DIAGNOSIS — Z992 Dependence on renal dialysis: Secondary | ICD-10-CM | POA: Diagnosis not present

## 2022-12-15 DIAGNOSIS — N186 End stage renal disease: Secondary | ICD-10-CM | POA: Diagnosis not present

## 2022-12-15 DIAGNOSIS — N2581 Secondary hyperparathyroidism of renal origin: Secondary | ICD-10-CM | POA: Diagnosis not present

## 2022-12-15 DIAGNOSIS — Z992 Dependence on renal dialysis: Secondary | ICD-10-CM | POA: Diagnosis not present

## 2022-12-17 DIAGNOSIS — N2581 Secondary hyperparathyroidism of renal origin: Secondary | ICD-10-CM | POA: Diagnosis not present

## 2022-12-17 DIAGNOSIS — Z992 Dependence on renal dialysis: Secondary | ICD-10-CM | POA: Diagnosis not present

## 2022-12-17 DIAGNOSIS — N186 End stage renal disease: Secondary | ICD-10-CM | POA: Diagnosis not present

## 2022-12-20 DIAGNOSIS — Z992 Dependence on renal dialysis: Secondary | ICD-10-CM | POA: Diagnosis not present

## 2022-12-20 DIAGNOSIS — N186 End stage renal disease: Secondary | ICD-10-CM | POA: Diagnosis not present

## 2022-12-20 DIAGNOSIS — N2581 Secondary hyperparathyroidism of renal origin: Secondary | ICD-10-CM | POA: Diagnosis not present

## 2022-12-22 DIAGNOSIS — Z992 Dependence on renal dialysis: Secondary | ICD-10-CM | POA: Diagnosis not present

## 2022-12-22 DIAGNOSIS — N186 End stage renal disease: Secondary | ICD-10-CM | POA: Diagnosis not present

## 2022-12-22 DIAGNOSIS — N2581 Secondary hyperparathyroidism of renal origin: Secondary | ICD-10-CM | POA: Diagnosis not present

## 2022-12-24 DIAGNOSIS — N186 End stage renal disease: Secondary | ICD-10-CM | POA: Diagnosis not present

## 2022-12-24 DIAGNOSIS — Z992 Dependence on renal dialysis: Secondary | ICD-10-CM | POA: Diagnosis not present

## 2022-12-24 DIAGNOSIS — N2581 Secondary hyperparathyroidism of renal origin: Secondary | ICD-10-CM | POA: Diagnosis not present

## 2022-12-27 DIAGNOSIS — Z992 Dependence on renal dialysis: Secondary | ICD-10-CM | POA: Diagnosis not present

## 2022-12-27 DIAGNOSIS — N2581 Secondary hyperparathyroidism of renal origin: Secondary | ICD-10-CM | POA: Diagnosis not present

## 2022-12-27 DIAGNOSIS — N186 End stage renal disease: Secondary | ICD-10-CM | POA: Diagnosis not present

## 2022-12-29 DIAGNOSIS — N2581 Secondary hyperparathyroidism of renal origin: Secondary | ICD-10-CM | POA: Diagnosis not present

## 2022-12-29 DIAGNOSIS — E8770 Fluid overload, unspecified: Secondary | ICD-10-CM | POA: Diagnosis not present

## 2022-12-29 DIAGNOSIS — Z992 Dependence on renal dialysis: Secondary | ICD-10-CM | POA: Diagnosis not present

## 2022-12-29 DIAGNOSIS — E8779 Other fluid overload: Secondary | ICD-10-CM | POA: Diagnosis not present

## 2022-12-29 DIAGNOSIS — N186 End stage renal disease: Secondary | ICD-10-CM | POA: Diagnosis not present

## 2022-12-30 ENCOUNTER — Ambulatory Visit: Payer: Medicare HMO | Admitting: Internal Medicine

## 2022-12-31 DIAGNOSIS — E8770 Fluid overload, unspecified: Secondary | ICD-10-CM | POA: Diagnosis not present

## 2022-12-31 DIAGNOSIS — E8779 Other fluid overload: Secondary | ICD-10-CM | POA: Diagnosis not present

## 2022-12-31 DIAGNOSIS — Z992 Dependence on renal dialysis: Secondary | ICD-10-CM | POA: Diagnosis not present

## 2022-12-31 DIAGNOSIS — N2581 Secondary hyperparathyroidism of renal origin: Secondary | ICD-10-CM | POA: Diagnosis not present

## 2022-12-31 DIAGNOSIS — N186 End stage renal disease: Secondary | ICD-10-CM | POA: Diagnosis not present

## 2023-01-03 DIAGNOSIS — E8779 Other fluid overload: Secondary | ICD-10-CM | POA: Diagnosis not present

## 2023-01-03 DIAGNOSIS — N2581 Secondary hyperparathyroidism of renal origin: Secondary | ICD-10-CM | POA: Diagnosis not present

## 2023-01-03 DIAGNOSIS — N186 End stage renal disease: Secondary | ICD-10-CM | POA: Diagnosis not present

## 2023-01-03 DIAGNOSIS — Z992 Dependence on renal dialysis: Secondary | ICD-10-CM | POA: Diagnosis not present

## 2023-01-03 DIAGNOSIS — E8770 Fluid overload, unspecified: Secondary | ICD-10-CM | POA: Diagnosis not present

## 2023-01-05 DIAGNOSIS — E8770 Fluid overload, unspecified: Secondary | ICD-10-CM | POA: Diagnosis not present

## 2023-01-05 DIAGNOSIS — N2581 Secondary hyperparathyroidism of renal origin: Secondary | ICD-10-CM | POA: Diagnosis not present

## 2023-01-05 DIAGNOSIS — N186 End stage renal disease: Secondary | ICD-10-CM | POA: Diagnosis not present

## 2023-01-05 DIAGNOSIS — Z992 Dependence on renal dialysis: Secondary | ICD-10-CM | POA: Diagnosis not present

## 2023-01-05 DIAGNOSIS — E8779 Other fluid overload: Secondary | ICD-10-CM | POA: Diagnosis not present

## 2023-01-07 DIAGNOSIS — N2581 Secondary hyperparathyroidism of renal origin: Secondary | ICD-10-CM | POA: Diagnosis not present

## 2023-01-07 DIAGNOSIS — E8770 Fluid overload, unspecified: Secondary | ICD-10-CM | POA: Diagnosis not present

## 2023-01-07 DIAGNOSIS — Z992 Dependence on renal dialysis: Secondary | ICD-10-CM | POA: Diagnosis not present

## 2023-01-07 DIAGNOSIS — E8779 Other fluid overload: Secondary | ICD-10-CM | POA: Diagnosis not present

## 2023-01-07 DIAGNOSIS — N186 End stage renal disease: Secondary | ICD-10-CM | POA: Diagnosis not present

## 2023-01-09 ENCOUNTER — Ambulatory Visit (INDEPENDENT_AMBULATORY_CARE_PROVIDER_SITE_OTHER): Payer: Medicare HMO | Admitting: Internal Medicine

## 2023-01-09 ENCOUNTER — Encounter: Payer: Self-pay | Admitting: Internal Medicine

## 2023-01-09 VITALS — BP 110/80 | HR 94 | Ht 68.0 in | Wt 273.2 lb

## 2023-01-09 DIAGNOSIS — E6609 Other obesity due to excess calories: Secondary | ICD-10-CM

## 2023-01-09 DIAGNOSIS — N186 End stage renal disease: Secondary | ICD-10-CM | POA: Diagnosis not present

## 2023-01-09 DIAGNOSIS — I10 Essential (primary) hypertension: Secondary | ICD-10-CM

## 2023-01-09 DIAGNOSIS — Z6832 Body mass index (BMI) 32.0-32.9, adult: Secondary | ICD-10-CM

## 2023-01-09 NOTE — Progress Notes (Signed)
Established Patient Office Visit  Subjective:  Patient ID: Joseph Gilmore, male    DOB: 04/22/1971  Age: 52 y.o. MRN: [id number]  Chief Complaint  Patient presents with   Follow-up    4 month follow up    No new complaints, here for lab review and medication refills. Tolerating dialysis without any complications and phosphorous level found to be low on recent labs.    No other concerns at this time.   Past Medical History:  Diagnosis Date   Autism    Chronic kidney disease    Hypertension    Murmur    OCD (obsessive compulsive disorder)     Past Surgical History:  Procedure Laterality Date   A/V FISTULAGRAM Left 10/07/2020   Procedure: A/V FISTULAGRAM;  Surgeon: Renford Dills, MD;  Location: [facility];  Service: Cardiovascular;  Laterality: Left;   AV FISTULA PLACEMENT Left 07/15/2020   Procedure: ARTERIOVENOUS FISTULA CREATION;  Surgeon: Renford Dills, MD;  Location: [facility];  Service: Vascular;  Laterality: Left;   DIALYSIS/PERMA CATHETER INSERTION N/A 04/06/2020   Procedure: DIALYSIS/PERMA CATHETER INSERTION;  Surgeon: Annice Needy, MD;  Location: [facility];  Service: Cardiovascular;  Laterality: N/A;   DIALYSIS/PERMA CATHETER INSERTION N/A 05/26/2020   Procedure: DIALYSIS/PERMA CATHETER INSERTION;  Surgeon: Renford Dills, MD;  Location: [facility];  Service: Cardiovascular;  Laterality: N/A;   DIALYSIS/PERMA CATHETER INSERTION N/A 07/09/2020   Procedure: DIALYSIS/PERMA CATHETER INSERTION;  Surgeon: Annice Needy, MD;  Location: [facility];  Service: Cardiovascular;  Laterality: N/A;   DIALYSIS/PERMA CATHETER INSERTION N/A 01/08/2021   Procedure: DIALYSIS/PERMA CATHETER INSERTION;  Surgeon: Annice Needy, MD;  Location: [facility];  Service: Cardiovascular;  Laterality: N/A;   DIALYSIS/PERMA CATHETER REMOVAL N/A 03/15/2021   Procedure: DIALYSIS/PERMA CATHETER REMOVAL;  Surgeon: Annice Needy, MD;  Location:  [facility];  Service: Cardiovascular;  Laterality: N/A;   REVISON OF ARTERIOVENOUS FISTULA Left 12/11/2020   Procedure: ARTERIOVENOUS (AV) FISTULA CREATION ( BRACHIAL CEPHALIC );  Surgeon: Renford Dills, MD;  Location: [facility];  Service: Vascular;  Laterality: Left;    Social History   Socioeconomic History   Marital status: Single    Spouse name: Not on file   Number of children: Not on file   Years of education: Not on file   Highest education level: Not on file  Occupational History   Not on file  Tobacco Use   Smoking status: Never    Passive exposure: Never   Smokeless tobacco: Never  Vaping Use   Vaping Use: Never used  Substance and Sexual Activity   Alcohol use: Never   Drug use: Never   Sexual activity: Not Currently  Other Topics Concern   Not on file  Social History Narrative   Lives with father .   Social Determinants of Health   Financial Resource Strain: Not on file  Food Insecurity: No Food Insecurity (05/10/2022)   Hunger Vital Sign    Worried About Running Out of Food in the Last Year: Never true    Ran Out of Food in the Last Year: Never true  Transportation Needs: No Transportation Needs (05/10/2022)   PRAPARE - Administrator, Civil Service (Medical): No    Lack of Transportation (Non-Medical): No  Physical Activity: Not on file  Stress: Not on file  Social Connections: Not on file  Intimate Partner Violence: Not on file  No family history on file.  Allergies  Allergen Reactions   Chlorhexidine Other (See Comments)    Blisters (topical)    Review of Systems  Constitutional: Negative.   HENT: Negative.    Eyes: Negative.   Respiratory: Negative.    Cardiovascular: Negative.   Gastrointestinal: Negative.   Genitourinary: Negative.   Skin: Negative.   Neurological: Negative.   Endo/Heme/Allergies: Negative.        Objective:   BP 110/80   Pulse 94   Ht 5\' 8"  (1.727 m)   Wt 273 lb 3.2 oz (123.9 kg)    SpO2 98%   BMI 41.54 kg/m   Vitals:   01/09/23 1444  BP: 110/80  Pulse: 94  Height: 5\' 8"  (1.727 m)  Weight: 273 lb 3.2 oz (123.9 kg)  SpO2: 98%  BMI (Calculated): 41.55    Physical Exam Vitals reviewed.  Constitutional:      Appearance: Normal appearance. He is obese.  HENT:     Head: Normocephalic.     Left Ear: There is no impacted cerumen.     Nose: Nose normal.     Mouth/Throat:     Mouth: Mucous membranes are moist.     Pharynx: No posterior oropharyngeal erythema.  Eyes:     Extraocular Movements: Extraocular movements intact.     Pupils: Pupils are equal, round, and reactive to light.  Cardiovascular:     Rate and Rhythm: Regular rhythm.     Chest Wall: PMI is not displaced.     Pulses: Normal pulses.     Heart sounds: Normal heart sounds. No murmur heard. Pulmonary:     Effort: Pulmonary effort is normal.     Breath sounds: Normal air entry. No rhonchi or rales.  Abdominal:     General: Abdomen is flat. Bowel sounds are normal. There is no distension.     Palpations: Abdomen is soft. There is no hepatomegaly, splenomegaly or mass.     Tenderness: There is no abdominal tenderness.  Musculoskeletal:        General: Normal range of motion.     Cervical back: Normal range of motion and neck supple.     Right lower leg: No edema.     Left lower leg: No edema.  Skin:    General: Skin is warm and dry.  Neurological:     General: No focal deficit present.     Mental Status: He is alert and oriented to person, place, and time.     Cranial Nerves: No cranial nerve deficit.     Motor: No weakness.  Psychiatric:        Mood and Affect: Mood normal.        Behavior: Behavior normal.      No results found for any visits on 01/09/23.  No results found for this or any previous visit (from the past 2160 hour(Nicki Gracy)).    Assessment & Plan:   Problem List Items Addressed This Visit   None   No follow-ups on file.   Total time spent: 20 minutes  Luna Fuse, MD  01/09/2023

## 2023-01-10 DIAGNOSIS — Z992 Dependence on renal dialysis: Secondary | ICD-10-CM | POA: Diagnosis not present

## 2023-01-10 DIAGNOSIS — N186 End stage renal disease: Secondary | ICD-10-CM | POA: Diagnosis not present

## 2023-01-10 DIAGNOSIS — N2581 Secondary hyperparathyroidism of renal origin: Secondary | ICD-10-CM | POA: Diagnosis not present

## 2023-01-10 DIAGNOSIS — E8770 Fluid overload, unspecified: Secondary | ICD-10-CM | POA: Diagnosis not present

## 2023-01-10 DIAGNOSIS — E8779 Other fluid overload: Secondary | ICD-10-CM | POA: Diagnosis not present

## 2023-01-12 DIAGNOSIS — N2581 Secondary hyperparathyroidism of renal origin: Secondary | ICD-10-CM | POA: Diagnosis not present

## 2023-01-12 DIAGNOSIS — E8779 Other fluid overload: Secondary | ICD-10-CM | POA: Diagnosis not present

## 2023-01-12 DIAGNOSIS — E8770 Fluid overload, unspecified: Secondary | ICD-10-CM | POA: Diagnosis not present

## 2023-01-12 DIAGNOSIS — Z992 Dependence on renal dialysis: Secondary | ICD-10-CM | POA: Diagnosis not present

## 2023-01-12 DIAGNOSIS — N186 End stage renal disease: Secondary | ICD-10-CM | POA: Diagnosis not present

## 2023-01-13 DIAGNOSIS — Z992 Dependence on renal dialysis: Secondary | ICD-10-CM | POA: Diagnosis not present

## 2023-01-13 DIAGNOSIS — E8779 Other fluid overload: Secondary | ICD-10-CM | POA: Diagnosis not present

## 2023-01-13 DIAGNOSIS — N2581 Secondary hyperparathyroidism of renal origin: Secondary | ICD-10-CM | POA: Diagnosis not present

## 2023-01-13 DIAGNOSIS — E8770 Fluid overload, unspecified: Secondary | ICD-10-CM | POA: Diagnosis not present

## 2023-01-13 DIAGNOSIS — N186 End stage renal disease: Secondary | ICD-10-CM | POA: Diagnosis not present

## 2023-01-14 DIAGNOSIS — N2581 Secondary hyperparathyroidism of renal origin: Secondary | ICD-10-CM | POA: Diagnosis not present

## 2023-01-14 DIAGNOSIS — E8779 Other fluid overload: Secondary | ICD-10-CM | POA: Diagnosis not present

## 2023-01-14 DIAGNOSIS — N186 End stage renal disease: Secondary | ICD-10-CM | POA: Diagnosis not present

## 2023-01-14 DIAGNOSIS — E8770 Fluid overload, unspecified: Secondary | ICD-10-CM | POA: Diagnosis not present

## 2023-01-14 DIAGNOSIS — Z992 Dependence on renal dialysis: Secondary | ICD-10-CM | POA: Diagnosis not present

## 2023-01-17 DIAGNOSIS — E8770 Fluid overload, unspecified: Secondary | ICD-10-CM | POA: Diagnosis not present

## 2023-01-17 DIAGNOSIS — E8779 Other fluid overload: Secondary | ICD-10-CM | POA: Diagnosis not present

## 2023-01-17 DIAGNOSIS — N186 End stage renal disease: Secondary | ICD-10-CM | POA: Diagnosis not present

## 2023-01-17 DIAGNOSIS — N2581 Secondary hyperparathyroidism of renal origin: Secondary | ICD-10-CM | POA: Diagnosis not present

## 2023-01-17 DIAGNOSIS — Z992 Dependence on renal dialysis: Secondary | ICD-10-CM | POA: Diagnosis not present

## 2023-01-19 DIAGNOSIS — E8779 Other fluid overload: Secondary | ICD-10-CM | POA: Diagnosis not present

## 2023-01-19 DIAGNOSIS — N186 End stage renal disease: Secondary | ICD-10-CM | POA: Diagnosis not present

## 2023-01-19 DIAGNOSIS — N2581 Secondary hyperparathyroidism of renal origin: Secondary | ICD-10-CM | POA: Diagnosis not present

## 2023-01-19 DIAGNOSIS — E8770 Fluid overload, unspecified: Secondary | ICD-10-CM | POA: Diagnosis not present

## 2023-01-19 DIAGNOSIS — Z992 Dependence on renal dialysis: Secondary | ICD-10-CM | POA: Diagnosis not present

## 2023-01-21 DIAGNOSIS — N2581 Secondary hyperparathyroidism of renal origin: Secondary | ICD-10-CM | POA: Diagnosis not present

## 2023-01-21 DIAGNOSIS — Z992 Dependence on renal dialysis: Secondary | ICD-10-CM | POA: Diagnosis not present

## 2023-01-21 DIAGNOSIS — E8779 Other fluid overload: Secondary | ICD-10-CM | POA: Diagnosis not present

## 2023-01-21 DIAGNOSIS — N186 End stage renal disease: Secondary | ICD-10-CM | POA: Diagnosis not present

## 2023-01-21 DIAGNOSIS — E8770 Fluid overload, unspecified: Secondary | ICD-10-CM | POA: Diagnosis not present

## 2023-01-24 DIAGNOSIS — E8779 Other fluid overload: Secondary | ICD-10-CM | POA: Diagnosis not present

## 2023-01-24 DIAGNOSIS — N2581 Secondary hyperparathyroidism of renal origin: Secondary | ICD-10-CM | POA: Diagnosis not present

## 2023-01-24 DIAGNOSIS — N186 End stage renal disease: Secondary | ICD-10-CM | POA: Diagnosis not present

## 2023-01-24 DIAGNOSIS — E8770 Fluid overload, unspecified: Secondary | ICD-10-CM | POA: Diagnosis not present

## 2023-01-24 DIAGNOSIS — Z992 Dependence on renal dialysis: Secondary | ICD-10-CM | POA: Diagnosis not present

## 2023-01-26 DIAGNOSIS — Z992 Dependence on renal dialysis: Secondary | ICD-10-CM | POA: Diagnosis not present

## 2023-01-26 DIAGNOSIS — N186 End stage renal disease: Secondary | ICD-10-CM | POA: Diagnosis not present

## 2023-01-26 DIAGNOSIS — E8779 Other fluid overload: Secondary | ICD-10-CM | POA: Diagnosis not present

## 2023-01-26 DIAGNOSIS — E8770 Fluid overload, unspecified: Secondary | ICD-10-CM | POA: Diagnosis not present

## 2023-01-26 DIAGNOSIS — N2581 Secondary hyperparathyroidism of renal origin: Secondary | ICD-10-CM | POA: Diagnosis not present

## 2023-01-27 DIAGNOSIS — Z992 Dependence on renal dialysis: Secondary | ICD-10-CM | POA: Diagnosis not present

## 2023-01-27 DIAGNOSIS — N186 End stage renal disease: Secondary | ICD-10-CM | POA: Diagnosis not present

## 2023-01-28 DIAGNOSIS — N186 End stage renal disease: Secondary | ICD-10-CM | POA: Diagnosis not present

## 2023-01-28 DIAGNOSIS — Z992 Dependence on renal dialysis: Secondary | ICD-10-CM | POA: Diagnosis not present

## 2023-01-28 DIAGNOSIS — N2581 Secondary hyperparathyroidism of renal origin: Secondary | ICD-10-CM | POA: Diagnosis not present

## 2023-01-31 DIAGNOSIS — N186 End stage renal disease: Secondary | ICD-10-CM | POA: Diagnosis not present

## 2023-01-31 DIAGNOSIS — N2581 Secondary hyperparathyroidism of renal origin: Secondary | ICD-10-CM | POA: Diagnosis not present

## 2023-01-31 DIAGNOSIS — Z992 Dependence on renal dialysis: Secondary | ICD-10-CM | POA: Diagnosis not present

## 2023-02-02 DIAGNOSIS — N186 End stage renal disease: Secondary | ICD-10-CM | POA: Diagnosis not present

## 2023-02-02 DIAGNOSIS — Z992 Dependence on renal dialysis: Secondary | ICD-10-CM | POA: Diagnosis not present

## 2023-02-02 DIAGNOSIS — N2581 Secondary hyperparathyroidism of renal origin: Secondary | ICD-10-CM | POA: Diagnosis not present

## 2023-02-04 DIAGNOSIS — N2581 Secondary hyperparathyroidism of renal origin: Secondary | ICD-10-CM | POA: Diagnosis not present

## 2023-02-04 DIAGNOSIS — N186 End stage renal disease: Secondary | ICD-10-CM | POA: Diagnosis not present

## 2023-02-04 DIAGNOSIS — Z992 Dependence on renal dialysis: Secondary | ICD-10-CM | POA: Diagnosis not present

## 2023-02-07 DIAGNOSIS — N2581 Secondary hyperparathyroidism of renal origin: Secondary | ICD-10-CM | POA: Diagnosis not present

## 2023-02-07 DIAGNOSIS — N186 End stage renal disease: Secondary | ICD-10-CM | POA: Diagnosis not present

## 2023-02-07 DIAGNOSIS — Z992 Dependence on renal dialysis: Secondary | ICD-10-CM | POA: Diagnosis not present

## 2023-02-09 DIAGNOSIS — N2581 Secondary hyperparathyroidism of renal origin: Secondary | ICD-10-CM | POA: Diagnosis not present

## 2023-02-09 DIAGNOSIS — N186 End stage renal disease: Secondary | ICD-10-CM | POA: Diagnosis not present

## 2023-02-09 DIAGNOSIS — Z992 Dependence on renal dialysis: Secondary | ICD-10-CM | POA: Diagnosis not present

## 2023-02-11 DIAGNOSIS — N186 End stage renal disease: Secondary | ICD-10-CM | POA: Diagnosis not present

## 2023-02-11 DIAGNOSIS — Z992 Dependence on renal dialysis: Secondary | ICD-10-CM | POA: Diagnosis not present

## 2023-02-11 DIAGNOSIS — N2581 Secondary hyperparathyroidism of renal origin: Secondary | ICD-10-CM | POA: Diagnosis not present

## 2023-02-13 ENCOUNTER — Encounter (INDEPENDENT_AMBULATORY_CARE_PROVIDER_SITE_OTHER): Payer: Self-pay | Admitting: Vascular Surgery

## 2023-02-13 ENCOUNTER — Telehealth (INDEPENDENT_AMBULATORY_CARE_PROVIDER_SITE_OTHER): Payer: Self-pay

## 2023-02-13 ENCOUNTER — Ambulatory Visit (INDEPENDENT_AMBULATORY_CARE_PROVIDER_SITE_OTHER): Payer: Medicare HMO | Admitting: Vascular Surgery

## 2023-02-13 ENCOUNTER — Ambulatory Visit (INDEPENDENT_AMBULATORY_CARE_PROVIDER_SITE_OTHER): Payer: Medicare HMO

## 2023-02-13 VITALS — BP 121/70 | HR 89 | Resp 18 | Ht 68.0 in | Wt 266.0 lb

## 2023-02-13 DIAGNOSIS — T829XXS Unspecified complication of cardiac and vascular prosthetic device, implant and graft, sequela: Secondary | ICD-10-CM | POA: Diagnosis not present

## 2023-02-13 DIAGNOSIS — I10 Essential (primary) hypertension: Secondary | ICD-10-CM | POA: Diagnosis not present

## 2023-02-13 DIAGNOSIS — N186 End stage renal disease: Secondary | ICD-10-CM | POA: Diagnosis not present

## 2023-02-13 DIAGNOSIS — Z992 Dependence on renal dialysis: Secondary | ICD-10-CM | POA: Diagnosis not present

## 2023-02-13 NOTE — Progress Notes (Unsigned)
MRN : [id number]  Joseph Gilmore is a 52 y.o. (06/04/1971) male who presents with chief complaint of check access.  History of Present Illness:   The patient returns to the office for followup status post intervention of the dialysis access 12/10/2020. Following the intervention the access function has significantly improved.  The patient's father notes a significant increase in bleeding time after decannulation.  The patient has also been informed that there is increased recirculation.    The patient denies hand pain or other symptoms consistent with steal phenomena.  No significant arm swelling.  The patient denies redness or swelling at the access site. The patient denies fever or chills at home or while on dialysis.  No recent shortening of the patient's walking distance or new symptoms consistent with claudication.  No history of rest pain symptoms. No new ulcers or wounds of the lower extremities have occurred.  The patient denies amaurosis fugax or recent TIA symptoms. There are no recent neurological changes noted. There is no history of DVT, PE or superficial thrombophlebitis. No recent episodes of angina or shortness of breath documented.   Duplex ultrasound of the AV access shows a patent access.  The previously noted stenosis is significantly increased compared to last study.  Flow volume today is 544 cc/min (previous flow volume was 768 cc/min)   No outpatient medications have been marked as taking for the 02/13/23 encounter (Appointment) with Gilda Crease, Latina Craver, MD.    Past Medical History:  Diagnosis Date   Autism    Chronic kidney disease    Hypertension    Murmur    OCD (obsessive compulsive disorder)     Past Surgical History:  Procedure Laterality Date   A/V FISTULAGRAM Left 10/07/2020   Procedure: A/V FISTULAGRAM;  Surgeon: Renford Dills, MD;  Location: [facility];  Service: Cardiovascular;  Laterality: Left;   AV FISTULA  PLACEMENT Left 07/15/2020   Procedure: ARTERIOVENOUS FISTULA CREATION;  Surgeon: Renford Dills, MD;  Location: [facility];  Service: Vascular;  Laterality: Left;   DIALYSIS/PERMA CATHETER INSERTION N/A 04/06/2020   Procedure: DIALYSIS/PERMA CATHETER INSERTION;  Surgeon: Annice Needy, MD;  Location: [facility];  Service: Cardiovascular;  Laterality: N/A;   DIALYSIS/PERMA CATHETER INSERTION N/A 05/26/2020   Procedure: DIALYSIS/PERMA CATHETER INSERTION;  Surgeon: Renford Dills, MD;  Location: [facility];  Service: Cardiovascular;  Laterality: N/A;   DIALYSIS/PERMA CATHETER INSERTION N/A 07/09/2020   Procedure: DIALYSIS/PERMA CATHETER INSERTION;  Surgeon: Annice Needy, MD;  Location: [facility];  Service: Cardiovascular;  Laterality: N/A;   DIALYSIS/PERMA CATHETER INSERTION N/A 01/08/2021   Procedure: DIALYSIS/PERMA CATHETER INSERTION;  Surgeon: Annice Needy, MD;  Location: [facility];  Service: Cardiovascular;  Laterality: N/A;   DIALYSIS/PERMA CATHETER REMOVAL N/A 03/15/2021   Procedure: DIALYSIS/PERMA CATHETER REMOVAL;  Surgeon: Annice Needy, MD;  Location: [facility];  Service: Cardiovascular;  Laterality: N/A;   REVISON OF ARTERIOVENOUS FISTULA Left 12/11/2020   Procedure: ARTERIOVENOUS (AV) FISTULA CREATION ( BRACHIAL CEPHALIC );  Surgeon: Renford Dills, MD;  Location: [facility];  Service: Vascular;  Laterality: Left;    Social History Social History   Tobacco Use   Smoking status: Never    Passive exposure: Never   Smokeless tobacco: Never  Vaping Use   Vaping Use: Never used  Substance  Use Topics   Alcohol use: Never   Drug use: Never    Family History No family history on file.  Allergies  Allergen Reactions   Chlorhexidine Other (See Comments)    Blisters (topical)     REVIEW OF SYSTEMS (Negative unless checked)  Constitutional: [] Weight loss  [] Fever  [] Chills Cardiac: [] Chest pain   [] Chest pressure    [] Palpitations   [] Shortness of breath when laying flat   [] Shortness of breath with exertion. Vascular:  [] Pain in legs with walking   [] Pain in legs at rest  [] History of DVT   [] Phlebitis   [] Swelling in legs   [] Varicose veins   [] Non-healing ulcers Pulmonary:   [] Uses home oxygen   [] Productive cough   [] Hemoptysis   [] Wheeze  [] COPD   [] Asthma Neurologic:  [] Dizziness   [] Seizures   [] History of stroke   [] History of TIA  [] Aphasia   [] Vissual changes   [] Weakness or numbness in arm   [] Weakness or numbness in leg Musculoskeletal:   [] Joint swelling   [] Joint pain   [] Low back pain Hematologic:  [] Easy bruising  [] Easy bleeding   [] Hypercoagulable state   [] Anemic Gastrointestinal:  [] Diarrhea   [] Vomiting  [] Gastroesophageal reflux/heartburn   [] Difficulty swallowing. Genitourinary:  [x] Chronic kidney disease   [] Difficult urination  [] Frequent urination   [] Blood in urine Skin:  [] Rashes   [] Ulcers  Psychological:  [] History of anxiety   []  History of major depression.  Physical Examination  There were no vitals filed for this visit. There is no height or weight on file to calculate BMI. Gen: WD/WN, NAD Head: [place]/AT, No temporalis wasting.  Ear/Nose/Throat: Hearing grossly intact, nares w/o erythema or drainage Eyes: PER, EOMI, sclera nonicteric.  Neck: Supple, no gross masses or lesions.  No JVD.  Pulmonary:  Good air movement, no audible wheezing, no use of accessory muscles.  Cardiac: RRR, precordium non-hyperdynamic. Vascular:   Left upper arm brachiocephalic fistula with very weak thrill and moderate pulsatility.  Skin overlying the areas that are being accessed remains intact Vessel Right Left  Radial Palpable Palpable  Brachial Palpable Palpable  Gastrointestinal: soft, non-distended. No guarding/no peritoneal signs.  Musculoskeletal: M/S 5/5 throughout.  No deformity.  Neurologic: CN 2-12 intact. Pain and light touch intact in extremities.  Symmetrical.  Speech is fluent.  Motor exam as listed above. Psychiatric: Judgment intact, Mood & affect appropriate for pt's clinical situation. Dermatologic: No rashes or ulcers noted.  No changes consistent with cellulitis.   CBC Lab Results  Component Value Date   WBC 7.9 12/09/2020   HGB 11.2 (L) 12/11/2020   HCT 33.0 (L) 12/11/2020   MCV 93.6 12/09/2020   PLT 179 12/09/2020    BMET    Component Value Date/Time   NA 140 12/11/2020 0920   K 4.0 12/11/2020 0920   CL 97 (L) 12/11/2020 0920   CO2 30 12/09/2020 1012   GLUCOSE 85 12/11/2020 0920   BUN 40 (H) 12/11/2020 0920   CREATININE 12.10 (H) 12/11/2020 0920   CALCIUM 8.9 12/09/2020 1012   GFRNONAA 4 (L) 12/09/2020 1012   GFRAA 8 (L) 04/24/2020 1219   CrCl cannot be calculated (Patient's most recent lab result is older than the maximum 21 days allowed.).  COAG Lab Results  Component Value Date   INR 1.1 12/09/2020   INR 1.1 07/13/2020   INR 1.2 04/06/2020    Radiology No results found.   Assessment/Plan 1. ESRD on hemodialysis [facility]) Recommend:  The patient is experiencing  increasing problems with their dialysis access.  Patient should have a fistulagram with the intention for intervention.  The intention for intervention is to restore appropriate flow and prevent thrombosis and possible loss of the access.  As well as improve the quality of dialysis therapy.  The risks, benefits and alternative therapies were reviewed in detail with the patient.  All questions were answered.  The patient agrees to proceed with angio/intervention.    The patient will follow up with me in the office after the procedure.   2. Complication of vascular access for dialysis, sequela Recommend:  The patient is experiencing increasing problems with their dialysis access.  Patient should have a fistulagram with the intention for intervention.  The intention for intervention is to restore appropriate flow and prevent thrombosis and possible loss of the access.   As well as improve the quality of dialysis therapy.  The risks, benefits and alternative therapies were reviewed in detail with the patient.  All questions were answered.  The patient agrees to proceed with angio/intervention.    The patient will follow up with me in the office after the procedure.   3. Essential (primary) hypertension Continue antihypertensive medications as already ordered, these medications have been reviewed and there are no changes at this time.    Levora Dredge, MD  02/13/2023 1:02 PM

## 2023-02-13 NOTE — H&P (View-Only) (Signed)
MRN : [id number]  Joseph Gilmore is a 52 y.o. ([birth year]) male who presents with chief complaint of check access.  History of Present Illness:   The patient returns to the office for followup status post intervention of the dialysis access 12/10/2020. Following the intervention the access function has significantly improved.  The patient's father notes a significant increase in bleeding time after decannulation.  The patient has also been informed that there is increased recirculation.    The patient denies hand pain or other symptoms consistent with steal phenomena.  No significant arm swelling.  The patient denies redness or swelling at the access site. The patient denies fever or chills at home or while on dialysis.  No recent shortening of the patient's walking distance or new symptoms consistent with claudication.  No history of rest pain symptoms. No new ulcers or wounds of the lower extremities have occurred.  The patient denies amaurosis fugax or recent TIA symptoms. There are no recent neurological changes noted. There is no history of DVT, PE or superficial thrombophlebitis. No recent episodes of angina or shortness of breath documented.   Duplex ultrasound of the AV access shows a patent access.  The previously noted stenosis is significantly increased compared to last study.  Flow volume today is 544 cc/min (previous flow volume was 768 cc/min)   No outpatient medications have been marked as taking for the 02/13/23 encounter (Appointment) with Gilda Crease, Latina Craver, MD.    Past Medical History:  Diagnosis Date   Autism    Chronic kidney disease    Hypertension    Murmur    OCD (obsessive compulsive disorder)     Past Surgical History:  Procedure Laterality Date   A/V FISTULAGRAM Left 10/07/2020   Procedure: A/V FISTULAGRAM;  Surgeon: Renford Dills, MD;  Location: [facility];  Service: Cardiovascular;  Laterality: Left;   AV FISTULA  PLACEMENT Left 07/15/2020   Procedure: ARTERIOVENOUS FISTULA CREATION;  Surgeon: Renford Dills, MD;  Location: [facility];  Service: Vascular;  Laterality: Left;   DIALYSIS/PERMA CATHETER INSERTION N/A 04/06/2020   Procedure: DIALYSIS/PERMA CATHETER INSERTION;  Surgeon: Annice Needy, MD;  Location: [facility];  Service: Cardiovascular;  Laterality: N/A;   DIALYSIS/PERMA CATHETER INSERTION N/A 05/26/2020   Procedure: DIALYSIS/PERMA CATHETER INSERTION;  Surgeon: Renford Dills, MD;  Location: [facility];  Service: Cardiovascular;  Laterality: N/A;   DIALYSIS/PERMA CATHETER INSERTION N/A 07/09/2020   Procedure: DIALYSIS/PERMA CATHETER INSERTION;  Surgeon: Annice Needy, MD;  Location: [facility];  Service: Cardiovascular;  Laterality: N/A;   DIALYSIS/PERMA CATHETER INSERTION N/A 01/08/2021   Procedure: DIALYSIS/PERMA CATHETER INSERTION;  Surgeon: Annice Needy, MD;  Location: [facility];  Service: Cardiovascular;  Laterality: N/A;   DIALYSIS/PERMA CATHETER REMOVAL N/A 03/15/2021   Procedure: DIALYSIS/PERMA CATHETER REMOVAL;  Surgeon: Annice Needy, MD;  Location: [facility];  Service: Cardiovascular;  Laterality: N/A;   REVISON OF ARTERIOVENOUS FISTULA Left 12/11/2020   Procedure: ARTERIOVENOUS (AV) FISTULA CREATION ( BRACHIAL CEPHALIC );  Surgeon: Renford Dills, MD;  Location: [facility];  Service: Vascular;  Laterality: Left;    Social History Social History   Tobacco Use   Smoking status: Never    Passive exposure: Never   Smokeless tobacco: Never  Vaping Use   Vaping Use: Never used  Substance  Use Topics   Alcohol use: Never   Drug use: Never    Family History No family history on file.  Allergies  Allergen Reactions   Chlorhexidine Other (See Comments)    Blisters (topical)     REVIEW OF SYSTEMS (Negative unless checked)  Constitutional: [] Weight loss  [] Fever  [] Chills Cardiac: [] Chest pain   [] Chest pressure    [] Palpitations   [] Shortness of breath when laying flat   [] Shortness of breath with exertion. Vascular:  [] Pain in legs with walking   [] Pain in legs at rest  [] History of DVT   [] Phlebitis   [] Swelling in legs   [] Varicose veins   [] Non-healing ulcers Pulmonary:   [] Uses home oxygen   [] Productive cough   [] Hemoptysis   [] Wheeze  [] COPD   [] Asthma Neurologic:  [] Dizziness   [] Seizures   [] History of stroke   [] History of TIA  [] Aphasia   [] Vissual changes   [] Weakness or numbness in arm   [] Weakness or numbness in leg Musculoskeletal:   [] Joint swelling   [] Joint pain   [] Low back pain Hematologic:  [] Easy bruising  [] Easy bleeding   [] Hypercoagulable state   [] Anemic Gastrointestinal:  [] Diarrhea   [] Vomiting  [] Gastroesophageal reflux/heartburn   [] Difficulty swallowing. Genitourinary:  [x] Chronic kidney disease   [] Difficult urination  [] Frequent urination   [] Blood in urine Skin:  [] Rashes   [] Ulcers  Psychological:  [] History of anxiety   []  History of major depression.  Physical Examination  There were no vitals filed for this visit. There is no height or weight on file to calculate BMI. Gen: WD/WN, NAD Head: [place]/AT, No temporalis wasting.  Ear/Nose/Throat: Hearing grossly intact, nares w/o erythema or drainage Eyes: PER, EOMI, sclera nonicteric.  Neck: Supple, no gross masses or lesions.  No JVD.  Pulmonary:  Good air movement, no audible wheezing, no use of accessory muscles.  Cardiac: RRR, precordium non-hyperdynamic. Vascular:   Left upper arm brachiocephalic fistula with very weak thrill and moderate pulsatility.  Skin overlying the areas that are being accessed remains intact Vessel Right Left  Radial Palpable Palpable  Brachial Palpable Palpable  Gastrointestinal: soft, non-distended. No guarding/no peritoneal signs.  Musculoskeletal: M/S 5/5 throughout.  No deformity.  Neurologic: CN 2-12 intact. Pain and light touch intact in extremities.  Symmetrical.  Speech is fluent.  Motor exam as listed above. Psychiatric: Judgment intact, Mood & affect appropriate for pt's clinical situation. Dermatologic: No rashes or ulcers noted.  No changes consistent with cellulitis.   CBC Lab Results  Component Value Date   WBC 7.9 12/09/2020   HGB 11.2 (L) 12/11/2020   HCT 33.0 (L) 12/11/2020   MCV 93.6 12/09/2020   PLT 179 12/09/2020    BMET    Component Value Date/Time   NA 140 12/11/2020 0920   K 4.0 12/11/2020 0920   CL 97 (L) 12/11/2020 0920   CO2 30 12/09/2020 1012   GLUCOSE 85 12/11/2020 0920   BUN 40 (H) 12/11/2020 0920   CREATININE 12.10 (H) 12/11/2020 0920   CALCIUM 8.9 12/09/2020 1012   GFRNONAA 4 (L) 12/09/2020 1012   GFRAA 8 (L) 04/24/2020 1219   CrCl cannot be calculated (Patient's most recent lab result is older than the maximum 21 days allowed.).  COAG Lab Results  Component Value Date   INR 1.1 12/09/2020   INR 1.1 07/13/2020   INR 1.2 04/06/2020    Radiology No results found.   Assessment/Plan 1. ESRD on hemodialysis [facility]) Recommend:  The patient is experiencing  increasing problems with their dialysis access.  Patient should have a fistulagram with the intention for intervention.  The intention for intervention is to restore appropriate flow and prevent thrombosis and possible loss of the access.  As well as improve the quality of dialysis therapy.  The risks, benefits and alternative therapies were reviewed in detail with the patient.  All questions were answered.  The patient agrees to proceed with angio/intervention.    The patient will follow up with me in the office after the procedure.   2. Complication of vascular access for dialysis, sequela Recommend:  The patient is experiencing increasing problems with their dialysis access.  Patient should have a fistulagram with the intention for intervention.  The intention for intervention is to restore appropriate flow and prevent thrombosis and possible loss of the access.   As well as improve the quality of dialysis therapy.  The risks, benefits and alternative therapies were reviewed in detail with the patient.  All questions were answered.  The patient agrees to proceed with angio/intervention.    The patient will follow up with me in the office after the procedure.   3. Essential (primary) hypertension Continue antihypertensive medications as already ordered, these medications have been reviewed and there are no changes at this time.    Levora Dredge, MD  02/13/2023 1:02 PM

## 2023-02-13 NOTE — Telephone Encounter (Signed)
Spoke with the patient and he is scheduled with Dr. Gilda Crease on 02/15/23 for a left arm fistulagram with intervention. A 10:45 am arrival time to the [facility]. Pre-procedure instructions were discussed and patient stated he understood. Patient does not have Mychart.

## 2023-02-14 DIAGNOSIS — N186 End stage renal disease: Secondary | ICD-10-CM | POA: Diagnosis not present

## 2023-02-14 DIAGNOSIS — Z992 Dependence on renal dialysis: Secondary | ICD-10-CM | POA: Diagnosis not present

## 2023-02-14 DIAGNOSIS — N2581 Secondary hyperparathyroidism of renal origin: Secondary | ICD-10-CM | POA: Diagnosis not present

## 2023-02-15 ENCOUNTER — Encounter
Admission: RE | Disposition: A | Discharge status: Home / Self Care | Payer: Self-pay | Source: Home / Self Care | Attending: Vascular Surgery

## 2023-02-15 ENCOUNTER — Ambulatory Visit
Admission: RE | Admit: 2023-02-15 | Discharge: 2023-02-15 | Disposition: A | Discharge status: Home / Self Care | Payer: Medicare HMO | Attending: Vascular Surgery | Admitting: Vascular Surgery

## 2023-02-15 ENCOUNTER — Encounter: Payer: Self-pay | Admitting: Vascular Surgery

## 2023-02-15 ENCOUNTER — Encounter (INDEPENDENT_AMBULATORY_CARE_PROVIDER_SITE_OTHER): Payer: Self-pay | Admitting: Vascular Surgery

## 2023-02-15 ENCOUNTER — Other Ambulatory Visit: Payer: Self-pay

## 2023-02-15 DIAGNOSIS — T82858A Stenosis of vascular prosthetic devices, implants and grafts, initial encounter: Secondary | ICD-10-CM

## 2023-02-15 DIAGNOSIS — Y841 Kidney dialysis as the cause of abnormal reaction of the patient, or of later complication, without mention of misadventure at the time of the procedure: Secondary | ICD-10-CM | POA: Diagnosis not present

## 2023-02-15 DIAGNOSIS — T82898A Other specified complication of vascular prosthetic devices, implants and grafts, initial encounter: Secondary | ICD-10-CM | POA: Diagnosis not present

## 2023-02-15 DIAGNOSIS — Z992 Dependence on renal dialysis: Secondary | ICD-10-CM | POA: Diagnosis not present

## 2023-02-15 DIAGNOSIS — N186 End stage renal disease: Secondary | ICD-10-CM | POA: Insufficient documentation

## 2023-02-15 DIAGNOSIS — I120 Hypertensive chronic kidney disease with stage 5 chronic kidney disease or end stage renal disease: Secondary | ICD-10-CM | POA: Insufficient documentation

## 2023-02-15 HISTORY — PX: A/V FISTULAGRAM: CATH118298

## 2023-02-15 LAB — POTASSIUM (ARMC VASCULAR LAB ONLY): Potassium (ARMC vascular lab): 5.4 mmol/L — ABNORMAL HIGH (ref 3.5–5.1)

## 2023-02-15 SURGERY — A/V FISTULAGRAM
Anesthesia: Moderate Sedation | Laterality: Left

## 2023-02-15 MED ORDER — CEFAZOLIN SODIUM-DEXTROSE 1-4 GM/50ML-% IV SOLN
1.0000 g | INTRAVENOUS | Status: AC
  Administered 2023-02-15: 1 g via INTRAVENOUS

## 2023-02-15 MED ORDER — CEFAZOLIN SODIUM-DEXTROSE 1-4 GM/50ML-% IV SOLN
INTRAVENOUS | Status: AC
  Filled 2023-02-15: qty 50

## 2023-02-15 MED ORDER — FENTANYL CITRATE (PF) 100 MCG/2ML IJ SOLN
INTRAMUSCULAR | Status: AC
  Filled 2023-02-15: qty 2

## 2023-02-15 MED ORDER — SODIUM CHLORIDE 0.9 % IV SOLN: INTRAVENOUS | Status: DC

## 2023-02-15 MED ORDER — METHYLPREDNISOLONE SODIUM SUCC 125 MG IJ SOLR: 125.0000 mg | Freq: Once | INTRAMUSCULAR | Status: DC | PRN

## 2023-02-15 MED ORDER — MIDAZOLAM HCL 2 MG/ML PO SYRP: 8.0000 mg | ORAL_SOLUTION | Freq: Once | ORAL | Status: DC | PRN

## 2023-02-15 MED ORDER — ONDANSETRON HCL 4 MG/2ML IJ SOLN: 4.0000 mg | Freq: Four times a day (QID) | INTRAMUSCULAR | Status: DC | PRN

## 2023-02-15 MED ORDER — IODIXANOL 320 MG/ML IV SOLN
INTRAVENOUS | Status: DC | PRN
  Administered 2023-02-15: 30 mL

## 2023-02-15 MED ORDER — MIDAZOLAM HCL 2 MG/2ML IJ SOLN
INTRAMUSCULAR | Status: AC
  Filled 2023-02-15: qty 4

## 2023-02-15 MED ORDER — HYDROMORPHONE HCL 1 MG/ML IJ SOLN: 1.0000 mg | Freq: Once | INTRAMUSCULAR | Status: DC | PRN

## 2023-02-15 MED ORDER — DIPHENHYDRAMINE HCL 50 MG/ML IJ SOLN: 50.0000 mg | Freq: Once | INTRAMUSCULAR | Status: DC | PRN

## 2023-02-15 MED ORDER — HEPARIN SODIUM (PORCINE) 1000 UNIT/ML IJ SOLN
INTRAMUSCULAR | Status: AC
  Filled 2023-02-15: qty 10

## 2023-02-15 MED ORDER — FENTANYL CITRATE (PF) 100 MCG/2ML IJ SOLN
INTRAMUSCULAR | Status: DC | PRN
  Administered 2023-02-15: 12.5 ug via INTRAVENOUS
  Administered 2023-02-15: 50 ug via INTRAVENOUS

## 2023-02-15 MED ORDER — HEPARIN SODIUM (PORCINE) 1000 UNIT/ML IJ SOLN
INTRAMUSCULAR | Status: DC | PRN
  Administered 2023-02-15: 3000 [IU] via INTRAVENOUS

## 2023-02-15 MED ORDER — MIDAZOLAM HCL 2 MG/2ML IJ SOLN
INTRAMUSCULAR | Status: DC | PRN
  Administered 2023-02-15: 2 mg via INTRAVENOUS
  Administered 2023-02-15: .5 mg via INTRAVENOUS

## 2023-02-15 MED ORDER — LIDOCAINE HCL (PF) 1 % IJ SOLN
INTRAMUSCULAR | Status: DC | PRN
  Administered 2023-02-15: 10 mL

## 2023-02-15 MED ORDER — HEPARIN (PORCINE) IN NACL 1000-0.9 UT/500ML-% IV SOLN
INTRAVENOUS | Status: DC | PRN
  Administered 2023-02-15 (×2): 1000 mL via INTRA_ARTERIAL

## 2023-02-15 MED ORDER — FAMOTIDINE 20 MG PO TABS: 40.0000 mg | ORAL_TABLET | Freq: Once | ORAL | Status: DC | PRN

## 2023-02-15 SURGICAL SUPPLY — 21 items
BALLN DORADO 6X40X80 (BALLOONS) ×1
BALLN DORADO 8X40X80 (BALLOONS) ×1
BALLOON DORADO 6X40X80 (BALLOONS) IMPLANT
BALLOON DORADO 8X40X80 (BALLOONS) IMPLANT
COVER PROBE ULTRASOUND 5X96 (MISCELLANEOUS) IMPLANT
DRAPE BRACHIAL (DRAPES) IMPLANT
GOWN STRL REUS W/ TWL LRG LVL3 (GOWN DISPOSABLE) ×1 IMPLANT
GOWN STRL REUS W/ TWL XL LVL3 (GOWN DISPOSABLE) IMPLANT
GOWN STRL REUS W/TWL LRG LVL3 (GOWN DISPOSABLE) ×2
GOWN STRL REUS W/TWL XL LVL3 (GOWN DISPOSABLE) ×1
KIT ENCORE 26 ADVANTAGE (KITS) IMPLANT
KIT FLUENT MICRO-INTRO (KITS) IMPLANT
NDL ENTRY 21GA 7CM ECHOTIP (NEEDLE) IMPLANT
NEEDLE ENTRY 21GA 7CM ECHOTIP (NEEDLE) ×1 IMPLANT
PACK ANGIOGRAPHY (CUSTOM PROCEDURE TRAY) ×1 IMPLANT
SHEATH BRITE TIP 6FRX5.5 (SHEATH) IMPLANT
SUT MNCRL 4-0 (SUTURE) ×1
SUT MNCRL 4-0 27XMFL (SUTURE) ×1
SUTURE MNCRL 4-0 27XMF (SUTURE) IMPLANT
TOWEL GREEN STERILE FF (TOWEL DISPOSABLE) IMPLANT
WIRE HI TORQ VERSACORE 300 (WIRE) IMPLANT

## 2023-02-15 NOTE — Interval H&P Note (Signed)
History and Physical Interval Note:  02/15/2023 12:17 PM  Joseph Gilmore  has presented today for surgery, with the diagnosis of L arm fistulagram w intervention    ESR.  The various methods of treatment have been discussed with the patient and family. After consideration of risks, benefits and other options for treatment, the patient has consented to  Procedure(s): A/V Fistulagram (Left) as a surgical intervention.  The patient's history has been reviewed, patient examined, no change in status, stable for surgery.  I have reviewed the patient's chart and labs.  Questions were answered to the patient's satisfaction.     Levora Dredge

## 2023-02-15 NOTE — Op Note (Signed)
OPERATIVE NOTE   PROCEDURE: Contrast injection left brachiocephalic AV access Percutaneous transluminal angioplasty midportion of the peripheral segment to 8 mm  PRE-OPERATIVE DIAGNOSIS: Complication of dialysis access                                                       End Stage Renal Disease  POST-OPERATIVE DIAGNOSIS: same as above   SURGEON: Renford Dills, M.D.  ANESTHESIA: Conscious sedation was administered under my direct supervision by the interventional radiology RN. IV Versed plus fentanyl were utilized. Continuous ECG, pulse oximetry and blood pressure was monitored throughout the entire procedure.  Conscious sedation was for a total of 30 minutes.  ESTIMATED BLOOD LOSS: minimal  FINDING(S): Stricture of the AV graft  SPECIMEN(S):  None  CONTRAST: 30 cc  FLUOROSCOPY TIME: 1.0 minutes  INDICATIONS: Joseph Gilmore is a 52 y.o. male who  presents with malfunctioning left arm brachiocephalic AV access.  The patient is scheduled for angiography with possible intervention of the AV access.  The patient is aware the risks include but are not limited to: bleeding, infection, thrombosis of the cannulated access, and possible anaphylactic reaction to the contrast.  The patient acknowledges if the access can not be salvaged a tunneled catheter will be needed and will be placed during this procedure.  The patient is aware of the risks of the procedure and elects to proceed with the angiogram and intervention.  DESCRIPTION: After full informed written consent was obtained, the patient was brought back to the Special Procedure suite and placed supine position.  Appropriate cardiopulmonary monitors were placed.  The left arm was prepped and draped in the standard fashion.  Appropriate timeout is called. The brachiocephalic fistula was cannulated with a micropuncture needle.  Cannulation was performed with ultrasound guidance. Ultrasound was placed in a sterile sleeve, the AV access  was interrogated and noted to be echolucent and compressible indicating patency. Image was recorded for the permanent record. The puncture is performed under continuous ultrasound visualization.   The microwire was advanced and the needle was exchanged for  a microsheath.  The J-wire was then advanced and a 6 Fr sheath inserted.  Hand injections were completed to image the access from the arterial anastomosis through the entire access.  The central venous structures were also imaged by hand injections.  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  The aneurysm remains unchanged.  Outflow of the aneurysm up to the cephalic subclavian confluence is widely patent.  The cephalic subclavian confluence is widely patent.  Subclavian and On[place]et and superior vena cava are widely patent.  Reflux images demonstrate that the arterial anastomosis patent visualized portions brachial artery are patent.    3000 units of heparin was given and a wire was negotiated through the strictures within the venous portion of the graft.  A 7 mm x 40 mm Dorado balloon was advanced across the lesion inflated to 12 atm for approximately 1 minute.  Follow-up imaging demonstrated improvement but the stricture remained undersized and an 8 mm x 40 mm Dorado balloon was advanced across the lesion and inflated to 20 atm at which point the lesion did yield.  Follow-up imaging now demonstrates less than 5% residual stenosis with a marked improvement in  the flow through his fistula.   A 4-0 Monocryl purse-string suture was sewn around the sheath.  The sheath was removed and light pressure was applied.  A sterile bandage was applied to the puncture site.    COMPLICATIONS: None  CONDITION: Almon Register, M.D [facility] Office: [phone]  02/15/2023 12:55 PM

## 2023-02-16 ENCOUNTER — Encounter: Payer: Self-pay | Admitting: Vascular Surgery

## 2023-02-16 DIAGNOSIS — N2581 Secondary hyperparathyroidism of renal origin: Secondary | ICD-10-CM | POA: Diagnosis not present

## 2023-02-16 DIAGNOSIS — Z992 Dependence on renal dialysis: Secondary | ICD-10-CM | POA: Diagnosis not present

## 2023-02-16 DIAGNOSIS — N186 End stage renal disease: Secondary | ICD-10-CM | POA: Diagnosis not present

## 2023-02-18 DIAGNOSIS — Z992 Dependence on renal dialysis: Secondary | ICD-10-CM | POA: Diagnosis not present

## 2023-02-18 DIAGNOSIS — N186 End stage renal disease: Secondary | ICD-10-CM | POA: Diagnosis not present

## 2023-02-18 DIAGNOSIS — N2581 Secondary hyperparathyroidism of renal origin: Secondary | ICD-10-CM | POA: Diagnosis not present

## 2023-02-21 DIAGNOSIS — N186 End stage renal disease: Secondary | ICD-10-CM | POA: Diagnosis not present

## 2023-02-21 DIAGNOSIS — N2581 Secondary hyperparathyroidism of renal origin: Secondary | ICD-10-CM | POA: Diagnosis not present

## 2023-02-21 DIAGNOSIS — Z992 Dependence on renal dialysis: Secondary | ICD-10-CM | POA: Diagnosis not present

## 2023-02-23 DIAGNOSIS — N186 End stage renal disease: Secondary | ICD-10-CM | POA: Diagnosis not present

## 2023-02-23 DIAGNOSIS — N2581 Secondary hyperparathyroidism of renal origin: Secondary | ICD-10-CM | POA: Diagnosis not present

## 2023-02-23 DIAGNOSIS — Z992 Dependence on renal dialysis: Secondary | ICD-10-CM | POA: Diagnosis not present

## 2023-02-25 DIAGNOSIS — Z992 Dependence on renal dialysis: Secondary | ICD-10-CM | POA: Diagnosis not present

## 2023-02-25 DIAGNOSIS — N186 End stage renal disease: Secondary | ICD-10-CM | POA: Diagnosis not present

## 2023-02-25 DIAGNOSIS — N2581 Secondary hyperparathyroidism of renal origin: Secondary | ICD-10-CM | POA: Diagnosis not present

## 2023-02-26 DIAGNOSIS — Z992 Dependence on renal dialysis: Secondary | ICD-10-CM | POA: Diagnosis not present

## 2023-02-26 DIAGNOSIS — N186 End stage renal disease: Secondary | ICD-10-CM | POA: Diagnosis not present

## 2023-02-28 DIAGNOSIS — N2581 Secondary hyperparathyroidism of renal origin: Secondary | ICD-10-CM | POA: Diagnosis not present

## 2023-02-28 DIAGNOSIS — N186 End stage renal disease: Secondary | ICD-10-CM | POA: Diagnosis not present

## 2023-02-28 DIAGNOSIS — Z992 Dependence on renal dialysis: Secondary | ICD-10-CM | POA: Diagnosis not present

## 2023-03-02 DIAGNOSIS — Z992 Dependence on renal dialysis: Secondary | ICD-10-CM | POA: Diagnosis not present

## 2023-03-02 DIAGNOSIS — N2581 Secondary hyperparathyroidism of renal origin: Secondary | ICD-10-CM | POA: Diagnosis not present

## 2023-03-02 DIAGNOSIS — N186 End stage renal disease: Secondary | ICD-10-CM | POA: Diagnosis not present

## 2023-03-04 DIAGNOSIS — N2581 Secondary hyperparathyroidism of renal origin: Secondary | ICD-10-CM | POA: Diagnosis not present

## 2023-03-04 DIAGNOSIS — Z992 Dependence on renal dialysis: Secondary | ICD-10-CM | POA: Diagnosis not present

## 2023-03-04 DIAGNOSIS — N186 End stage renal disease: Secondary | ICD-10-CM | POA: Diagnosis not present

## 2023-03-07 ENCOUNTER — Other Ambulatory Visit (INDEPENDENT_AMBULATORY_CARE_PROVIDER_SITE_OTHER): Payer: Self-pay | Admitting: Vascular Surgery

## 2023-03-07 DIAGNOSIS — N2581 Secondary hyperparathyroidism of renal origin: Secondary | ICD-10-CM | POA: Diagnosis not present

## 2023-03-07 DIAGNOSIS — N186 End stage renal disease: Secondary | ICD-10-CM | POA: Diagnosis not present

## 2023-03-07 DIAGNOSIS — Z992 Dependence on renal dialysis: Secondary | ICD-10-CM | POA: Diagnosis not present

## 2023-03-09 DIAGNOSIS — Z992 Dependence on renal dialysis: Secondary | ICD-10-CM | POA: Diagnosis not present

## 2023-03-09 DIAGNOSIS — N2581 Secondary hyperparathyroidism of renal origin: Secondary | ICD-10-CM | POA: Diagnosis not present

## 2023-03-09 DIAGNOSIS — N186 End stage renal disease: Secondary | ICD-10-CM | POA: Diagnosis not present

## 2023-03-11 DIAGNOSIS — N2581 Secondary hyperparathyroidism of renal origin: Secondary | ICD-10-CM | POA: Diagnosis not present

## 2023-03-11 DIAGNOSIS — Z992 Dependence on renal dialysis: Secondary | ICD-10-CM | POA: Diagnosis not present

## 2023-03-11 DIAGNOSIS — N186 End stage renal disease: Secondary | ICD-10-CM | POA: Diagnosis not present

## 2023-03-14 ENCOUNTER — Telehealth (INDEPENDENT_AMBULATORY_CARE_PROVIDER_SITE_OTHER): Payer: Self-pay

## 2023-03-14 NOTE — Telephone Encounter (Signed)
Spoke with the patient and he is scheduled with Dr. Gilda Crease on 03/15/23 for a left arm thrombectomy at the [facility]. Pre-procedure instructions were discussed and patient stated he understood.

## 2023-03-15 ENCOUNTER — Ambulatory Visit: Admit: 2023-03-15 | Payer: Medicare HMO | Admitting: Vascular Surgery

## 2023-03-15 ENCOUNTER — Other Ambulatory Visit: Payer: Self-pay

## 2023-03-15 ENCOUNTER — Encounter
Admission: AD | Disposition: A | Discharge status: Home / Self Care | Payer: Self-pay | Source: Home / Self Care | Attending: Internal Medicine

## 2023-03-15 ENCOUNTER — Encounter: Payer: Self-pay | Admitting: Vascular Surgery

## 2023-03-15 ENCOUNTER — Inpatient Hospital Stay
Admission: AD | Admit: 2023-03-15 | Discharge: 2023-03-17 | DRG: 640 | Disposition: A | Discharge status: Home / Self Care | Payer: Medicare HMO | Attending: Internal Medicine | Admitting: Internal Medicine

## 2023-03-15 DIAGNOSIS — D631 Anemia in chronic kidney disease: Secondary | ICD-10-CM | POA: Diagnosis not present

## 2023-03-15 DIAGNOSIS — E875 Hyperkalemia: Secondary | ICD-10-CM | POA: Diagnosis not present

## 2023-03-15 DIAGNOSIS — F429 Obsessive-compulsive disorder, unspecified: Secondary | ICD-10-CM | POA: Diagnosis present

## 2023-03-15 DIAGNOSIS — T82898A Other specified complication of vascular prosthetic devices, implants and grafts, initial encounter: Secondary | ICD-10-CM

## 2023-03-15 DIAGNOSIS — Y832 Surgical operation with anastomosis, bypass or graft as the cause of abnormal reaction of the patient, or of later complication, without mention of misadventure at the time of the procedure: Secondary | ICD-10-CM | POA: Diagnosis present

## 2023-03-15 DIAGNOSIS — F840 Autistic disorder: Secondary | ICD-10-CM | POA: Diagnosis present

## 2023-03-15 DIAGNOSIS — Z9889 Other specified postprocedural states: Secondary | ICD-10-CM | POA: Diagnosis not present

## 2023-03-15 DIAGNOSIS — N2581 Secondary hyperparathyroidism of renal origin: Secondary | ICD-10-CM | POA: Diagnosis not present

## 2023-03-15 DIAGNOSIS — N186 End stage renal disease: Secondary | ICD-10-CM | POA: Diagnosis not present

## 2023-03-15 DIAGNOSIS — N189 Chronic kidney disease, unspecified: Secondary | ICD-10-CM | POA: Diagnosis present

## 2023-03-15 DIAGNOSIS — I120 Hypertensive chronic kidney disease with stage 5 chronic kidney disease or end stage renal disease: Secondary | ICD-10-CM | POA: Diagnosis present

## 2023-03-15 DIAGNOSIS — N400 Enlarged prostate without lower urinary tract symptoms: Secondary | ICD-10-CM | POA: Diagnosis not present

## 2023-03-15 DIAGNOSIS — T82868A Thrombosis of vascular prosthetic devices, implants and grafts, initial encounter: Secondary | ICD-10-CM | POA: Diagnosis present

## 2023-03-15 DIAGNOSIS — E871 Hypo-osmolality and hyponatremia: Secondary | ICD-10-CM | POA: Diagnosis not present

## 2023-03-15 DIAGNOSIS — Z91158 Patient's noncompliance with renal dialysis for other reason: Secondary | ICD-10-CM | POA: Diagnosis not present

## 2023-03-15 DIAGNOSIS — Z992 Dependence on renal dialysis: Secondary | ICD-10-CM

## 2023-03-15 DIAGNOSIS — Z79899 Other long term (current) drug therapy: Secondary | ICD-10-CM | POA: Diagnosis not present

## 2023-03-15 DIAGNOSIS — T829XXA Unspecified complication of cardiac and vascular prosthetic device, implant and graft, initial encounter: Secondary | ICD-10-CM | POA: Diagnosis present

## 2023-03-15 DIAGNOSIS — Z6841 Body Mass Index (BMI) 40.0 and over, adult: Secondary | ICD-10-CM

## 2023-03-15 DIAGNOSIS — T829XXS Unspecified complication of cardiac and vascular prosthetic device, implant and graft, sequela: Secondary | ICD-10-CM | POA: Diagnosis not present

## 2023-03-15 HISTORY — PX: TEMPORARY DIALYSIS CATHETER: CATH118312

## 2023-03-15 LAB — COMPREHENSIVE METABOLIC PANEL
ALT: 16 U/L (ref 0–44)
AST: 22 U/L (ref 15–41)
Albumin: 3.7 g/dL (ref 3.5–5.0)
Alkaline Phosphatase: 56 U/L (ref 38–126)
Anion gap: 17 — ABNORMAL HIGH (ref 5–15)
BUN: 87 mg/dL — ABNORMAL HIGH (ref 6–20)
CO2: 17 mmol/L — ABNORMAL LOW (ref 22–32)
Calcium: 7.2 mg/dL — ABNORMAL LOW (ref 8.9–10.3)
Chloride: 104 mmol/L (ref 98–111)
Creatinine, Ser: 21.87 mg/dL — ABNORMAL HIGH (ref 0.61–1.24)
GFR, Estimated: 2 mL/min — ABNORMAL LOW (ref >60)
Glucose, Bld: 79 mg/dL (ref 70–99)
Potassium: 7.5 mmol/L (ref 3.5–5.1)
Sodium: 138 mmol/L (ref 135–145)
Total Bilirubin: 0.7 mg/dL (ref 0.3–1.2)
Total Protein: 7 g/dL (ref 6.5–8.1)

## 2023-03-15 LAB — POTASSIUM (ARMC VASCULAR LAB ONLY): Potassium (ARMC vascular lab): 7.1 mmol/L (ref 3.5–5.1)

## 2023-03-15 LAB — CBC
HCT: 27.5 % — ABNORMAL LOW (ref 39.0–52.0)
Hemoglobin: 9 g/dL — ABNORMAL LOW (ref 13.0–17.0)
MCH: 32.3 pg (ref 26.0–34.0)
MCHC: 32.7 g/dL (ref 30.0–36.0)
MCV: 98.6 fL (ref 80.0–100.0)
Platelets: 185 10*3/uL (ref 150–400)
RBC: 2.79 MIL/uL — ABNORMAL LOW (ref 4.22–5.81)
RDW: 17.4 % — ABNORMAL HIGH (ref 11.5–15.5)
WBC: 8.6 10*3/uL (ref 4.0–10.5)
nRBC: 0 % (ref 0.0–0.2)

## 2023-03-15 LAB — HIV ANTIBODY (ROUTINE TESTING W REFLEX): HIV Screen 4th Generation wRfx: NONREACTIVE

## 2023-03-15 LAB — BASIC METABOLIC PANEL
Anion gap: 16 — ABNORMAL HIGH (ref 5–15)
BUN: 44 mg/dL — ABNORMAL HIGH (ref 6–20)
CO2: 24 mmol/L (ref 22–32)
Calcium: 7.6 mg/dL — ABNORMAL LOW (ref 8.9–10.3)
Chloride: 95 mmol/L — ABNORMAL LOW (ref 98–111)
Creatinine, Ser: 13.26 mg/dL — ABNORMAL HIGH (ref 0.61–1.24)
GFR, Estimated: 4 mL/min — ABNORMAL LOW (ref >60)
Glucose, Bld: 122 mg/dL — ABNORMAL HIGH (ref 70–99)
Potassium: 3.7 mmol/L (ref 3.5–5.1)
Sodium: 135 mmol/L (ref 135–145)

## 2023-03-15 LAB — POTASSIUM
Potassium: 7.5 mmol/L (ref 3.5–5.1)
Potassium: 4 mmol/L (ref 3.5–5.1)

## 2023-03-15 LAB — HEPATITIS B SURFACE ANTIGEN: Hepatitis B Surface Ag: NONREACTIVE

## 2023-03-15 SURGERY — TEMPORARY DIALYSIS CATHETER
Anesthesia: LOCAL

## 2023-03-15 MED ORDER — CALCIUM GLUCONATE-NACL 1-0.675 GM/50ML-% IV SOLN
1.0000 g | Freq: Once | INTRAVENOUS | Status: AC
  Administered 2023-03-15: 1000 mg via INTRAVENOUS
  Filled 2023-03-15: qty 50

## 2023-03-15 MED ORDER — METHYLPREDNISOLONE SODIUM SUCC 125 MG IJ SOLR: 125.0000 mg | Freq: Once | INTRAMUSCULAR | Status: DC | PRN

## 2023-03-15 MED ORDER — MIDAZOLAM HCL 2 MG/ML PO SYRP: 8.0000 mg | ORAL_SOLUTION | Freq: Once | ORAL | Status: DC | PRN

## 2023-03-15 MED ORDER — SODIUM ZIRCONIUM CYCLOSILICATE 10 G PO PACK
10.0000 g | PACK | Freq: Once | ORAL | Status: AC
  Administered 2023-03-15: 10 g via ORAL
  Filled 2023-03-15: qty 1

## 2023-03-15 MED ORDER — FAMOTIDINE 20 MG PO TABS: 40.0000 mg | ORAL_TABLET | Freq: Once | ORAL | Status: DC | PRN

## 2023-03-15 MED ORDER — SODIUM CHLORIDE 0.9 % IV SOLN: 250.0000 mL | INTRAVENOUS | Status: DC | PRN

## 2023-03-15 MED ORDER — ALBUTEROL SULFATE (2.5 MG/3ML) 0.083% IN NEBU
10.0000 mg | INHALATION_SOLUTION | Freq: Once | RESPIRATORY_TRACT | Status: AC
  Administered 2023-03-15: 10 mg via RESPIRATORY_TRACT

## 2023-03-15 MED ORDER — INSULIN ASPART 100 UNIT/ML IV SOLN
10.0000 [IU] | Freq: Once | INTRAVENOUS | Status: AC
  Administered 2023-03-15: 10 [IU] via INTRAVENOUS
  Filled 2023-03-15: qty 0.1

## 2023-03-15 MED ORDER — INSULIN ASPART 100 UNIT/ML IJ SOLN
INTRAMUSCULAR | Status: AC
  Filled 2023-03-15: qty 1

## 2023-03-15 MED ORDER — SODIUM CHLORIDE 0.9% FLUSH: 3.0000 mL | INTRAVENOUS | Status: DC | PRN

## 2023-03-15 MED ORDER — ONDANSETRON HCL 4 MG/2ML IJ SOLN: 4.0000 mg | Freq: Four times a day (QID) | INTRAMUSCULAR | Status: DC | PRN

## 2023-03-15 MED ORDER — SODIUM CHLORIDE 0.9 % IV SOLN: INTRAVENOUS | Status: DC

## 2023-03-15 MED ORDER — ENOXAPARIN SODIUM 30 MG/0.3ML IJ SOSY
30.0000 mg | PREFILLED_SYRINGE | INTRAMUSCULAR | Status: DC
  Filled 2023-03-15: qty 0.3

## 2023-03-15 MED ORDER — DIPHENHYDRAMINE HCL 50 MG/ML IJ SOLN: 50.0000 mg | Freq: Once | INTRAMUSCULAR | Status: DC | PRN

## 2023-03-15 MED ORDER — CEFAZOLIN SODIUM-DEXTROSE 1-4 GM/50ML-% IV SOLN: 1.0000 g | INTRAVENOUS | Status: DC

## 2023-03-15 MED ORDER — ALBUTEROL SULFATE (2.5 MG/3ML) 0.083% IN NEBU
INHALATION_SOLUTION | RESPIRATORY_TRACT | Status: AC
  Filled 2023-03-15: qty 3

## 2023-03-15 MED ORDER — DEXTROSE 50 % IV SOLN
1.0000 | Freq: Once | INTRAVENOUS | Status: AC
  Administered 2023-03-15: 50 mL via INTRAVENOUS

## 2023-03-15 MED ORDER — HEPARIN SODIUM (PORCINE) 1000 UNIT/ML IJ SOLN
INTRAMUSCULAR | Status: AC
  Filled 2023-03-15: qty 10

## 2023-03-15 MED ORDER — HYDROMORPHONE HCL 1 MG/ML IJ SOLN: 1.0000 mg | Freq: Once | INTRAMUSCULAR | Status: DC | PRN

## 2023-03-15 MED ORDER — CEFAZOLIN SODIUM-DEXTROSE 1-4 GM/50ML-% IV SOLN
INTRAVENOUS | Status: AC
  Filled 2023-03-15: qty 50

## 2023-03-15 MED ORDER — SODIUM CHLORIDE 0.9% FLUSH
3.0000 mL | Freq: Two times a day (BID) | INTRAVENOUS | Status: DC
  Administered 2023-03-16 (×2): 3 mL via INTRAVENOUS

## 2023-03-15 MED ORDER — ALBUTEROL SULFATE (2.5 MG/3ML) 0.083% IN NEBU
INHALATION_SOLUTION | RESPIRATORY_TRACT | Status: AC
  Filled 2023-03-15: qty 12

## 2023-03-15 MED ORDER — DEXTROSE 50 % IV SOLN
INTRAVENOUS | Status: AC
  Filled 2023-03-15: qty 50

## 2023-03-15 SURGICAL SUPPLY — 3 items
GOWN STRL REUS W/ TWL LRG LVL3 (GOWN DISPOSABLE) ×1 IMPLANT
GOWN STRL REUS W/TWL LRG LVL3 (GOWN DISPOSABLE) ×1
KIT DIALYSIS CATH TRI 30X13 (CATHETERS) IMPLANT

## 2023-03-15 NOTE — Progress Notes (Signed)
MRN : [id number]  Joseph Gilmore is a 52 y.o. (1971/07/12) male who presents with chief complaint of check access.  History of Present Illness:   The patient returns to the office for follow up regarding a problem with their dialysis access.   The patient's father notes the dialysis center says they couldn't dialyze the patient.    The patient denies hand pain or other symptoms consistent with steal phenomena.  No significant arm swelling.  The patient denies redness or swelling at the access site. The patient denies fever or chills at home or while on dialysis.  He is s/p PTA of the midportion of the left brachial cephalic fistula on 02/15/2023.   Current Meds  Medication Sig   Cholecalciferol (VITAMIN D-3) 125 MCG (5000 UT) TABS Take 5,000 Units by mouth daily.   dutasteride (AVODART) 0.5 MG capsule Take 1 capsule (0.5 mg total) by mouth daily.   multivitamin (RENA-VIT) TABS tablet Take by mouth.   tamsulosin (FLOMAX) 0.4 MG CAPS capsule Take 1 capsule (0.4 mg total) by mouth daily.    Past Medical History:  Diagnosis Date   Autism    Chronic kidney disease    Hypertension    Murmur    OCD (obsessive compulsive disorder)     Past Surgical History:  Procedure Laterality Date   A/V FISTULAGRAM Left 10/07/2020   Procedure: A/V FISTULAGRAM;  Surgeon: Renford Dills, MD;  Location: [facility];  Service: Cardiovascular;  Laterality: Left;   A/V FISTULAGRAM Left 02/15/2023   Procedure: A/V Fistulagram;  Surgeon: Renford Dills, MD;  Location: [facility];  Service: Cardiovascular;  Laterality: Left;   AV FISTULA PLACEMENT Left 07/15/2020   Procedure: ARTERIOVENOUS FISTULA CREATION;  Surgeon: Renford Dills, MD;  Location: [facility];  Service: Vascular;  Laterality: Left;   DIALYSIS/PERMA CATHETER INSERTION N/A 04/06/2020   Procedure: DIALYSIS/PERMA CATHETER INSERTION;  Surgeon: Annice Needy, MD;  Location: [facility];   Service: Cardiovascular;  Laterality: N/A;   DIALYSIS/PERMA CATHETER INSERTION N/A 05/26/2020   Procedure: DIALYSIS/PERMA CATHETER INSERTION;  Surgeon: Renford Dills, MD;  Location: [facility];  Service: Cardiovascular;  Laterality: N/A;   DIALYSIS/PERMA CATHETER INSERTION N/A 07/09/2020   Procedure: DIALYSIS/PERMA CATHETER INSERTION;  Surgeon: Annice Needy, MD;  Location: [facility];  Service: Cardiovascular;  Laterality: N/A;   DIALYSIS/PERMA CATHETER INSERTION N/A 01/08/2021   Procedure: DIALYSIS/PERMA CATHETER INSERTION;  Surgeon: Annice Needy, MD;  Location: [facility];  Service: Cardiovascular;  Laterality: N/A;   DIALYSIS/PERMA CATHETER REMOVAL N/A 03/15/2021   Procedure: DIALYSIS/PERMA CATHETER REMOVAL;  Surgeon: Annice Needy, MD;  Location: [facility];  Service: Cardiovascular;  Laterality: N/A;   REVISON OF ARTERIOVENOUS FISTULA Left 12/11/2020   Procedure: ARTERIOVENOUS (AV) FISTULA CREATION ( BRACHIAL CEPHALIC );  Surgeon: Renford Dills, MD;  Location: [facility];  Service: Vascular;  Laterality: Left;    Social History Social History   Tobacco Use   Smoking status: Never    Passive exposure: Never   Smokeless tobacco: Never  Vaping Use   Vaping status: Never Used  Substance Use Topics   Alcohol use: Never   Drug use: Never    Family History History reviewed. No pertinent family history.  Allergies  Allergen Reactions   Chlorhexidine Other (See Comments)  Blisters (topical)     REVIEW OF SYSTEMS (Negative unless checked)  Constitutional: [] Weight loss  [] Fever  [] Chills Cardiac: [] Chest pain   [] Chest pressure   [] Palpitations   [] Shortness of breath when laying flat   [] Shortness of breath with exertion. Vascular:  [] Pain in legs with walking   [] Pain in legs at rest  [] History of DVT   [] Phlebitis   [] Swelling in legs   [] Varicose veins   [] Non-healing ulcers Pulmonary:   [] Uses home oxygen   [] Productive cough    [] Hemoptysis   [] Wheeze  [] COPD   [] Asthma Neurologic:  [] Dizziness   [] Seizures   [] History of stroke   [] History of TIA  [] Aphasia   [] Vissual changes   [] Weakness or numbness in arm   [] Weakness or numbness in leg Musculoskeletal:   [] Joint swelling   [] Joint pain   [] Low back pain Hematologic:  [] Easy bruising  [] Easy bleeding   [] Hypercoagulable state   [] Anemic Gastrointestinal:  [] Diarrhea   [] Vomiting  [] Gastroesophageal reflux/heartburn   [] Difficulty swallowing. Genitourinary:  [x] Chronic kidney disease   [] Difficult urination  [] Frequent urination   [] Blood in urine Skin:  [] Rashes   [] Ulcers  Psychological:  [] History of anxiety   []  History of major depression.  Physical Examination  Vitals:   03/15/23 0820 03/15/23 0826  BP: (!) 148/96   Pulse: 75   Resp: 20   Temp: 98.1 F (36.7 C)   TempSrc: Oral   SpO2: 98%   Weight:  102.1 kg  Height: 5\' 8"  (1.727 m)    Body mass index is 34.21 kg/m. Gen: WD/WN, NAD Head: /AT, No temporalis wasting.  Ear/Nose/Throat: Hearing grossly intact, nares w/o erythema or drainage Eyes: PER, EOMI, sclera nonicteric.  Neck: Supple, no gross masses or lesions.  No JVD.  Pulmonary:  Good air movement, no audible wheezing, no use of accessory muscles.  Cardiac: RRR, precordium non-hyperdynamic. Vascular:   left AV fistula no bruit, fistula is pulsatile at the anastomosis  Vessel Right Left  Radial Palpable Palpable  Brachial Palpable Palpable  Gastrointestinal: soft, non-distended. No guarding/no peritoneal signs.  Musculoskeletal: M/S 5/5 throughout.  No deformity.  Neurologic: CN 2-12 intact. Pain and light touch intact in extremities.  Symmetrical.  Speech is fluent. Motor exam as listed above. Psychiatric: Judgment intact, Mood & affect appropriate for pt's clinical situation. Dermatologic: No rashes or ulcers noted.  No changes consistent with cellulitis.   CBC Lab Results  Component Value Date   WBC 7.9 12/09/2020   HGB 11.2  (L) 12/11/2020   HCT 33.0 (L) 12/11/2020   MCV 93.6 12/09/2020   PLT 179 12/09/2020    BMET    Component Value Date/Time   NA 140 12/11/2020 0920   K 7.5 (HH) 03/15/2023 0932   CL 97 (L) 12/11/2020 0920   CO2 30 12/09/2020 1012   GLUCOSE 85 12/11/2020 0920   BUN 40 (H) 12/11/2020 0920   CREATININE 12.10 (H) 12/11/2020 0920   CALCIUM 8.9 12/09/2020 1012   GFRNONAA 4 (L) 12/09/2020 1012   GFRAA 8 (L) 04/24/2020 1219   CrCl cannot be calculated (Patient's most recent lab result is older than the maximum 21 days allowed.).  COAG Lab Results  Component Value Date   INR 1.1 12/09/2020   INR 1.1 07/13/2020   INR 1.2 04/06/2020    Radiology VAS US DUPLEX DIALYSIS ACCESS (AVF, AVG)  Result Date: 02/16/2023 DIALYSIS ACCESS Patient Name:  Joseph Gilmore  Date of Exam:   02/13/2023  Medical Rec #: [id number]         Accession #:    [id number] Date of Birth: 1971-07-11         Patient Gender: M Patient Age:   61 years Exam Location:  [facility] Procedure:      VAS US DUPLEX DIALYSIS ACCESS (AVF, AVG) Referring Phys: Levora Dredge --------------------------------------------------------------------------------  Reason for Exam: Routine follow up. Access Site: Left Upper Extremity. Access Type: Brachial-cephalic AVF. History: 07/15/20: Left BrachCephalic AVF created;          12/10/20: PTA and revision stenosis of anastomosis site, mid AVF areas;. Performing Technologist: Hardie Lora RVT  Examination Guidelines: A complete evaluation includes B-mode imaging, spectral Doppler, color Doppler, and power Doppler as needed of all accessible portions of each vessel. Unilateral testing is considered an integral part of a complete examination. Limited examinations for reoccurring indications may be performed as noted.  Findings: +--------------------+----------+-----------------+--------+ AVF                 PSV (cm/s)Flow Vol (mL/min)Comments  +--------------------+----------+-----------------+--------+ Native artery inflow   127           544                +--------------------+----------+-----------------+--------+ AVF Anastomosis        774                     Tortuous +--------------------+----------+-----------------+--------+  +--------------+----------+------------+---------+----------------------------+ OUTFLOW VEIN  PSV (cm/s)  Diameter    Depth            Describe                                       (cm)      (cm)                                +--------------+----------+------------+---------+----------------------------+ Subclavian       104                                                      vein                                                                      +--------------+----------+------------+---------+----------------------------+ Axillary vein     26                                                      +--------------+----------+------------+---------+----------------------------+ Confluence        96        0.52                                          +--------------+----------+------------+---------+----------------------------+  Clavicle          57        0.54                                          +--------------+----------+------------+---------+----------------------------+ Shoulder          50        0.50                                          +--------------+----------+------------+---------+----------------------------+ Prox UA           94        0.59                                          +--------------+----------+------------+---------+----------------------------+ Mid UA           133        2.04                                          +--------------+----------+------------+---------+----------------------------+ Dist UA          423        0.28                change in Diameter and                                                            stenotic           +--------------+----------+------------+---------+----------------------------+ AC Fossa          56        0.79                                          +--------------+----------+------------+---------+----------------------------+   Summary: Arteriovenous fistula-Stenosis noted in the Distal upper arm with decrease in diameter. *See table(s) above for measurements and observations.  Diagnosing physician: Levora Dredge MD Electronically signed by Levora Dredge MD on 02/16/2023 at 3:23:05 PM.   --------------------------------------------------------------------------------   Final    PERIPHERAL VASCULAR CATHETERIZATION  Result Date: 02/15/2023 See surgical note for result.    Assessment/Plan 1. ESRD on hemodialysis [facility]) Recommend:   The patient is experiencing increasing problems with their dialysis access.  However, given the very high potasium he will require a temporary catheter first then we can move forward with fixing his fistula   Patient should have a fistulagram with the intention for intervention after his K+ is normalized.  The intention for intervention is to restore appropriate flow and prevent thrombosis and possible loss of the access.  As well as improve the quality of dialysis therapy.   The risks, benefits and alternative therapies were reviewed in detail with the patient.  All questions were answered.  The patient agrees to proceed with angio/intervention.  The patient will follow up with me in the office after the procedure.     2. Complication of vascular access for dialysis, sequela Recommend:   The patient is experiencing increasing problems with their dialysis access.  However, given the very high potasium he will require a temporary catheter first then we can move forward with fixing his fistula   Patient should have a fistulagram with the intention for intervention after his K+ is normalized.  The intention for  intervention is to restore appropriate flow and prevent thrombosis and possible loss of the access.  As well as improve the quality of dialysis therapy.   The risks, benefits and alternative therapies were reviewed in detail with the patient.  All questions were answered.  The patient agrees to proceed with angio/intervention.     The patient will follow up with me in the office after the procedure.   3. Essential (primary) hypertension Continue antihypertensive medications as already ordered, these medications have been reviewed and there are no changes at this time.   Levora Dredge, MD  03/15/2023 10:55 AM

## 2023-03-15 NOTE — Assessment & Plan Note (Addendum)
K 7.5 in setting of missed HD on admission, has been improved with emergent dialysis EKG was normal. Received hyperkalemia protocol w/ IV calcium, insulin, albuterol, lokelma  -Continue to monitor

## 2023-03-15 NOTE — Assessment & Plan Note (Signed)
Cont flomax and dutestaride

## 2023-03-15 NOTE — H&P (View-Only) (Signed)
MRN : [id number]  Joseph Gilmore is a 52 y.o. (1971/07/12) male who presents with chief complaint of check access.  History of Present Illness:   The patient returns to the office for follow up regarding a problem with their dialysis access.   The patient's father notes the dialysis center says they couldn't dialyze the patient.    The patient denies hand pain or other symptoms consistent with steal phenomena.  No significant arm swelling.  The patient denies redness or swelling at the access site. The patient denies fever or chills at home or while on dialysis.  He is s/p PTA of the midportion of the left brachial cephalic fistula on 02/15/2023.   Current Meds  Medication Sig   Cholecalciferol (VITAMIN D-3) 125 MCG (5000 UT) TABS Take 5,000 Units by mouth daily.   dutasteride (AVODART) 0.5 MG capsule Take 1 capsule (0.5 mg total) by mouth daily.   multivitamin (RENA-VIT) TABS tablet Take by mouth.   tamsulosin (FLOMAX) 0.4 MG CAPS capsule Take 1 capsule (0.4 mg total) by mouth daily.    Past Medical History:  Diagnosis Date   Autism    Chronic kidney disease    Hypertension    Murmur    OCD (obsessive compulsive disorder)     Past Surgical History:  Procedure Laterality Date   A/V FISTULAGRAM Left 10/07/2020   Procedure: A/V FISTULAGRAM;  Surgeon: Renford Dills, MD;  Location: [facility];  Service: Cardiovascular;  Laterality: Left;   A/V FISTULAGRAM Left 02/15/2023   Procedure: A/V Fistulagram;  Surgeon: Renford Dills, MD;  Location: [facility];  Service: Cardiovascular;  Laterality: Left;   AV FISTULA PLACEMENT Left 07/15/2020   Procedure: ARTERIOVENOUS FISTULA CREATION;  Surgeon: Renford Dills, MD;  Location: [facility];  Service: Vascular;  Laterality: Left;   DIALYSIS/PERMA CATHETER INSERTION N/A 04/06/2020   Procedure: DIALYSIS/PERMA CATHETER INSERTION;  Surgeon: Annice Needy, MD;  Location: [facility];   Service: Cardiovascular;  Laterality: N/A;   DIALYSIS/PERMA CATHETER INSERTION N/A 05/26/2020   Procedure: DIALYSIS/PERMA CATHETER INSERTION;  Surgeon: Renford Dills, MD;  Location: [facility];  Service: Cardiovascular;  Laterality: N/A;   DIALYSIS/PERMA CATHETER INSERTION N/A 07/09/2020   Procedure: DIALYSIS/PERMA CATHETER INSERTION;  Surgeon: Annice Needy, MD;  Location: [facility];  Service: Cardiovascular;  Laterality: N/A;   DIALYSIS/PERMA CATHETER INSERTION N/A 01/08/2021   Procedure: DIALYSIS/PERMA CATHETER INSERTION;  Surgeon: Annice Needy, MD;  Location: [facility];  Service: Cardiovascular;  Laterality: N/A;   DIALYSIS/PERMA CATHETER REMOVAL N/A 03/15/2021   Procedure: DIALYSIS/PERMA CATHETER REMOVAL;  Surgeon: Annice Needy, MD;  Location: [facility];  Service: Cardiovascular;  Laterality: N/A;   REVISON OF ARTERIOVENOUS FISTULA Left 12/11/2020   Procedure: ARTERIOVENOUS (AV) FISTULA CREATION ( BRACHIAL CEPHALIC );  Surgeon: Renford Dills, MD;  Location: [facility];  Service: Vascular;  Laterality: Left;    Social History Social History   Tobacco Use   Smoking status: Never    Passive exposure: Never   Smokeless tobacco: Never  Vaping Use   Vaping status: Never Used  Substance Use Topics   Alcohol use: Never   Drug use: Never    Family History History reviewed. No pertinent family history.  Allergies  Allergen Reactions   Chlorhexidine Other (See Comments)  Blisters (topical)     REVIEW OF SYSTEMS (Negative unless checked)  Constitutional: [] Weight loss  [] Fever  [] Chills Cardiac: [] Chest pain   [] Chest pressure   [] Palpitations   [] Shortness of breath when laying flat   [] Shortness of breath with exertion. Vascular:  [] Pain in legs with walking   [] Pain in legs at rest  [] History of DVT   [] Phlebitis   [] Swelling in legs   [] Varicose veins   [] Non-healing ulcers Pulmonary:   [] Uses home oxygen   [] Productive cough    [] Hemoptysis   [] Wheeze  [] COPD   [] Asthma Neurologic:  [] Dizziness   [] Seizures   [] History of stroke   [] History of TIA  [] Aphasia   [] Vissual changes   [] Weakness or numbness in arm   [] Weakness or numbness in leg Musculoskeletal:   [] Joint swelling   [] Joint pain   [] Low back pain Hematologic:  [] Easy bruising  [] Easy bleeding   [] Hypercoagulable state   [] Anemic Gastrointestinal:  [] Diarrhea   [] Vomiting  [] Gastroesophageal reflux/heartburn   [] Difficulty swallowing. Genitourinary:  [x] Chronic kidney disease   [] Difficult urination  [] Frequent urination   [] Blood in urine Skin:  [] Rashes   [] Ulcers  Psychological:  [] History of anxiety   []  History of major depression.  Physical Examination  Vitals:   03/15/23 0820 03/15/23 0826  BP: (!) 148/96   Pulse: 75   Resp: 20   Temp: 98.1 F (36.7 C)   TempSrc: Oral   SpO2: 98%   Weight:  102.1 kg  Height: 5\' 8"  (1.727 m)    Body mass index is 34.21 kg/m. Gen: WD/WN, NAD Head: /AT, No temporalis wasting.  Ear/Nose/Throat: Hearing grossly intact, nares w/o erythema or drainage Eyes: PER, EOMI, sclera nonicteric.  Neck: Supple, no gross masses or lesions.  No JVD.  Pulmonary:  Good air movement, no audible wheezing, no use of accessory muscles.  Cardiac: RRR, precordium non-hyperdynamic. Vascular:   left AV fistula no bruit, fistula is pulsatile at the anastomosis  Vessel Right Left  Radial Palpable Palpable  Brachial Palpable Palpable  Gastrointestinal: soft, non-distended. No guarding/no peritoneal signs.  Musculoskeletal: M/S 5/5 throughout.  No deformity.  Neurologic: CN 2-12 intact. Pain and light touch intact in extremities.  Symmetrical.  Speech is fluent. Motor exam as listed above. Psychiatric: Judgment intact, Mood & affect appropriate for pt's clinical situation. Dermatologic: No rashes or ulcers noted.  No changes consistent with cellulitis.   CBC Lab Results  Component Value Date   WBC 7.9 12/09/2020   HGB 11.2  (L) 12/11/2020   HCT 33.0 (L) 12/11/2020   MCV 93.6 12/09/2020   PLT 179 12/09/2020    BMET    Component Value Date/Time   NA 140 12/11/2020 0920   K 7.5 (HH) 03/15/2023 0932   CL 97 (L) 12/11/2020 0920   CO2 30 12/09/2020 1012   GLUCOSE 85 12/11/2020 0920   BUN 40 (H) 12/11/2020 0920   CREATININE 12.10 (H) 12/11/2020 0920   CALCIUM 8.9 12/09/2020 1012   GFRNONAA 4 (L) 12/09/2020 1012   GFRAA 8 (L) 04/24/2020 1219   CrCl cannot be calculated (Patient's most recent lab result is older than the maximum 21 days allowed.).  COAG Lab Results  Component Value Date   INR 1.1 12/09/2020   INR 1.1 07/13/2020   INR 1.2 04/06/2020    Radiology VAS US DUPLEX DIALYSIS ACCESS (AVF, AVG)  Result Date: 02/16/2023 DIALYSIS ACCESS Patient Name:  Joseph Gilmore  Date of Exam:   02/13/2023  Medical Rec #: [id number]         Accession #:    [id number] Date of Birth: 1971-07-11         Patient Gender: M Patient Age:   61 years Exam Location:  [facility] Procedure:      VAS US DUPLEX DIALYSIS ACCESS (AVF, AVG) Referring Phys: Levora Dredge --------------------------------------------------------------------------------  Reason for Exam: Routine follow up. Access Site: Left Upper Extremity. Access Type: Brachial-cephalic AVF. History: 07/15/20: Left BrachCephalic AVF created;          12/10/20: PTA and revision stenosis of anastomosis site, mid AVF areas;. Performing Technologist: Hardie Lora RVT  Examination Guidelines: A complete evaluation includes B-mode imaging, spectral Doppler, color Doppler, and power Doppler as needed of all accessible portions of each vessel. Unilateral testing is considered an integral part of a complete examination. Limited examinations for reoccurring indications may be performed as noted.  Findings: +--------------------+----------+-----------------+--------+ AVF                 PSV (cm/s)Flow Vol (mL/min)Comments  +--------------------+----------+-----------------+--------+ Native artery inflow   127           544                +--------------------+----------+-----------------+--------+ AVF Anastomosis        774                     Tortuous +--------------------+----------+-----------------+--------+  +--------------+----------+------------+---------+----------------------------+ OUTFLOW VEIN  PSV (cm/s)  Diameter    Depth            Describe                                       (cm)      (cm)                                +--------------+----------+------------+---------+----------------------------+ Subclavian       104                                                      vein                                                                      +--------------+----------+------------+---------+----------------------------+ Axillary vein     26                                                      +--------------+----------+------------+---------+----------------------------+ Confluence        96        0.52                                          +--------------+----------+------------+---------+----------------------------+  Clavicle          57        0.54                                          +--------------+----------+------------+---------+----------------------------+ Shoulder          50        0.50                                          +--------------+----------+------------+---------+----------------------------+ Prox UA           94        0.59                                          +--------------+----------+------------+---------+----------------------------+ Mid UA           133        2.04                                          +--------------+----------+------------+---------+----------------------------+ Dist UA          423        0.28                change in Diameter and                                                            stenotic           +--------------+----------+------------+---------+----------------------------+ AC Fossa          56        0.79                                          +--------------+----------+------------+---------+----------------------------+   Summary: Arteriovenous fistula-Stenosis noted in the Distal upper arm with decrease in diameter. *See table(s) above for measurements and observations.  Diagnosing physician: Levora Dredge MD Electronically signed by Levora Dredge MD on 02/16/2023 at 3:23:05 PM.   --------------------------------------------------------------------------------   Final    PERIPHERAL VASCULAR CATHETERIZATION  Result Date: 02/15/2023 See surgical note for result.    Assessment/Plan 1. ESRD on hemodialysis [facility]) Recommend:   The patient is experiencing increasing problems with their dialysis access.  However, given the very high potasium he will require a temporary catheter first then we can move forward with fixing his fistula   Patient should have a fistulagram with the intention for intervention after his K+ is normalized.  The intention for intervention is to restore appropriate flow and prevent thrombosis and possible loss of the access.  As well as improve the quality of dialysis therapy.   The risks, benefits and alternative therapies were reviewed in detail with the patient.  All questions were answered.  The patient agrees to proceed with angio/intervention.  The patient will follow up with me in the office after the procedure.     2. Complication of vascular access for dialysis, sequela Recommend:   The patient is experiencing increasing problems with their dialysis access.  However, given the very high potasium he will require a temporary catheter first then we can move forward with fixing his fistula   Patient should have a fistulagram with the intention for intervention after his K+ is normalized.  The intention for  intervention is to restore appropriate flow and prevent thrombosis and possible loss of the access.  As well as improve the quality of dialysis therapy.   The risks, benefits and alternative therapies were reviewed in detail with the patient.  All questions were answered.  The patient agrees to proceed with angio/intervention.     The patient will follow up with me in the office after the procedure.   3. Essential (primary) hypertension Continue antihypertensive medications as already ordered, these medications have been reviewed and there are no changes at this time.   Levora Dredge, MD  03/15/2023 10:55 AM

## 2023-03-15 NOTE — Progress Notes (Signed)
  Received patient in bed to unit.   Informed consent signed and in chart.    TX duration: 3.5hrs     Transported back to floor Hand-off given to patient's nurse.  No c/o and no distress noted    Access used: R Femoral HD catheter Access issues: none   Total UF removed: 2.5L Medication(s) given: none Post HD VS: 101/84 Post HD weight: UTO     Lynann Beaver  [facility]

## 2023-03-15 NOTE — Op Note (Signed)
  OPERATIVE NOTE   PROCEDURE: Insertion of temporary dialysis catheter catheter right femoral approach.  PRE-OPERATIVE DIAGNOSIS: Malignant hyperkalemia; complication AV dialysis access; end-stage renal disease requiring hemodialysis  POST-OPERATIVE DIAGNOSIS: Same  SURGEON: Renford Dills M.D.  ANESTHESIA: 1% lidocaine local infiltration  ESTIMATED BLOOD LOSS: Minimal cc  INDICATIONS:   Joseph Gilmore is a 52 y.o. male who presents with a very high potassium.  At this point thrombectomy would be unsafe and therefore temporary catheter is being inserted.  Risk and benefits have been reviewed with the patient as well as his family all questions been answered all are in agreement with proceeding.  DESCRIPTION: After obtaining full informed written consent, the patient was positioned supine. The right groin was prepped and draped in a sterile fashion. Ultrasound was placed in a sterile sleeve. Ultrasound was utilized to identify the right common femoral vein which is noted to be echolucent and compressible indicating patency. Images recorded for the permanent record. Under real-time visualization a Seldinger needle is inserted into the vein and the guidewires advanced without difficulty. Small counterincision was made at the wire insertion site. Dilator is passed over the wire and the temporary dialysis catheter catheter is fed over the wire without difficulty.  All lumens aspirate and flush easily and are packed with heparin saline. Catheter secured to the skin of the right thigh with 2-0 silk. A sterile dressing is applied with Biopatch.  COMPLICATIONS: None  CONDITION: Unchanged  Levora Dredge Office:  [phone] 03/15/2023, 11:29 AM

## 2023-03-15 NOTE — H&P (Addendum)
History and Physical    Patient: Joseph Gilmore MWU:[id number] DOB: 04/10/1971 DOA: 03/15/2023 DOS: the patient was seen and examined on 03/15/2023 PCP: Sherron Monday, MD  Patient coming from:  Cath lab   Chief Complaint: No chief complaint on file.  HPI: Joseph Gilmore is a 52 y.o. male with medical history significant of morbid obesity, autism, ESRD on HD TTS, HTN, OCD presenting w/ hyperkalemia. History from patient as well as the patients father in the setting of autism. Per report, patient with clotted left upper extremity vascular access.  Has been unable to obtain dialysis for the last 3 to 4 days.  No fevers or chills.  No chest pain or shortness of breath.  Patient denies any palpitations.  No nausea or vomiting.  No hemiparesis or confusion.  Has been relatively compliant with hemodialysis per report.  Follows Dr. Wynelle Link outpatient.  Was being evaluated today by Dr. Gilda Crease with vascular surgery for possible thrombectomy of left upper extremity.  Patient with noted potassium level of 7.5. Presented to the Cath Lab afebrile, hemodynamically stable.  Noted potassium's level of 7.5.  Labs including CMP, CBC pending.  EKG pending.  Hypokalemia protocol started. Dr. Wynelle Link with nephrology also contacted for hemodialysis today. Review of Systems: As mentioned in the history of present illness. All other systems reviewed and are negative. Past Medical History:  Diagnosis Date   Autism    Chronic kidney disease    Hypertension    Murmur    OCD (obsessive compulsive disorder)    Past Surgical History:  Procedure Laterality Date   A/V FISTULAGRAM Left 10/07/2020   Procedure: A/V FISTULAGRAM;  Surgeon: Renford Dills, MD;  Location: [facility];  Service: Cardiovascular;  Laterality: Left;   A/V FISTULAGRAM Left 02/15/2023   Procedure: A/V Fistulagram;  Surgeon: Renford Dills, MD;  Location: [facility];  Service: Cardiovascular;  Laterality: Left;   AV  FISTULA PLACEMENT Left 07/15/2020   Procedure: ARTERIOVENOUS FISTULA CREATION;  Surgeon: Renford Dills, MD;  Location: [facility];  Service: Vascular;  Laterality: Left;   DIALYSIS/PERMA CATHETER INSERTION N/A 04/06/2020   Procedure: DIALYSIS/PERMA CATHETER INSERTION;  Surgeon: Annice Needy, MD;  Location: [facility];  Service: Cardiovascular;  Laterality: N/A;   DIALYSIS/PERMA CATHETER INSERTION N/A 05/26/2020   Procedure: DIALYSIS/PERMA CATHETER INSERTION;  Surgeon: Renford Dills, MD;  Location: [facility];  Service: Cardiovascular;  Laterality: N/A;   DIALYSIS/PERMA CATHETER INSERTION N/A 07/09/2020   Procedure: DIALYSIS/PERMA CATHETER INSERTION;  Surgeon: Annice Needy, MD;  Location: [facility];  Service: Cardiovascular;  Laterality: N/A;   DIALYSIS/PERMA CATHETER INSERTION N/A 01/08/2021   Procedure: DIALYSIS/PERMA CATHETER INSERTION;  Surgeon: Annice Needy, MD;  Location: [facility];  Service: Cardiovascular;  Laterality: N/A;   DIALYSIS/PERMA CATHETER REMOVAL N/A 03/15/2021   Procedure: DIALYSIS/PERMA CATHETER REMOVAL;  Surgeon: Annice Needy, MD;  Location: [facility];  Service: Cardiovascular;  Laterality: N/A;   REVISON OF ARTERIOVENOUS FISTULA Left 12/11/2020   Procedure: ARTERIOVENOUS (AV) FISTULA CREATION ( BRACHIAL CEPHALIC );  Surgeon: Renford Dills, MD;  Location: [facility];  Service: Vascular;  Laterality: Left;   Social History:  reports that he has never smoked. He has never been exposed to tobacco smoke. He has never used smokeless tobacco. He reports that he does not drink alcohol and does not use drugs.  Allergies  Allergen Reactions   Chlorhexidine Other (See  Comments)    Blisters (topical)    History reviewed. No pertinent family history.  Prior to Admission medications   Medication Sig Start Date End Date Taking? Authorizing Provider  Cholecalciferol (VITAMIN D-3) 125 MCG (5000 UT) TABS Take 5,000 Units by  mouth daily.   Yes [provider]  dutasteride (AVODART) 0.5 MG capsule Take 1 capsule (0.5 mg total) by mouth daily. 08/03/22  Yes Stoioff, Verna Czech, MD  multivitamin (RENA-VIT) TABS tablet Take by mouth. 06/12/20  Yes [provider]  tamsulosin (FLOMAX) 0.4 MG CAPS capsule Take 1 capsule (0.4 mg total) by mouth daily. 08/03/22  Yes Stoioff, Verna Czech, MD  calcium acetate (PHOSLO) 667 MG capsule  04/03/21   [provider]    Physical Exam: Vitals:   03/15/23 0820 03/15/23 0826  BP: (!) 148/96   Pulse: 75   Resp: 20   Temp: 98.1 F (36.7 C)   TempSrc: Oral   SpO2: 98%   Weight:  102.1 kg  Height: 5\' 8"  (1.727 m)    Physical Exam Constitutional:      Appearance: He is obese.  HENT:     Head: Normocephalic and atraumatic.     Nose: Nose normal.  Eyes:     Pupils: Pupils are equal, round, and reactive to light.  Cardiovascular:     Rate and Rhythm: Normal rate and regular rhythm.  Pulmonary:     Effort: Pulmonary effort is normal.  Abdominal:     General: Bowel sounds are normal.  Musculoskeletal:        General: Normal range of motion.     Cervical back: Normal range of motion.  Skin:    General: Skin is warm.  Neurological:     General: No focal deficit present.  Psychiatric:        Mood and Affect: Mood normal.     Data Reviewed:  There are no new results to review at this time.  Assessment and Plan: * Hyperkalemia K 7.5 in setting of missed HD  EKG pending  Started hyperkalemia protocol w/ IV calcium, insulin, albuterol, lokelma pending HD Dr. Wynelle Link notified.   Complication of vascular access for dialysis Noted thrombosed LUE vascular access  Pending vascular surgery evaluation for thrombectomy  Follow up vascular surgery recommendation pending addressing of hyperkalemia    ESRD on hemodialysis (HCC) Baseline ESRD on HD TTS  Missed HD yesterday in setting of thrombosed LUE vascular access- pending vascular surgery assessment   K 7.5 Dr. Wynelle Link w/ nephrology notified for HD today     Benign prostatic hyperplasia without lower urinary tract symptoms Cont flomax and dutestaride   Autistic disorder Baseline autistic disorder  Father answers questions for the patient    Anemia in chronic kidney disease Hgb pending       Advance Care Planning:   Code Status: Full Code   Consults: Nephrology- Dr. Wynelle Link, Vascular surgery- Dr. Gilda Crease   Family Communication: Father at the bedside   Severity of Illness: The appropriate patient status for this patient is INPATIENT. Inpatient status is judged to be reasonable and necessary in order to provide the required intensity of service to ensure the patient's safety. The patient's presenting symptoms, physical exam findings, and initial radiographic and laboratory data in the context of their chronic comorbidities is felt to place them at high risk for further clinical deterioration. Furthermore, it is not anticipated that the patient will be medically stable for discharge from the hospital within 2 midnights of admission.   *  I certify that at the point of admission it is my clinical judgment that the patient will require inpatient hospital care spanning beyond 2 midnights from the point of admission due to high intensity of service, high risk for further deterioration and high frequency of surveillance required.*  Author: Floydene Flock, MD 03/15/2023 12:04 PM  For on call review [email or web address].

## 2023-03-15 NOTE — Progress Notes (Signed)
Admitting was called to have a Hospitalist admit the patient for treatment of elevated Potassium of 7.5. Patient was scheduled for outpatient AV fistulagram today but it will be delayed until we obtain a lower potassium level. A potassium level of 5.5 would be preferred.

## 2023-03-15 NOTE — H&P (View-Only) (Signed)
Admitting was called to have a Hospitalist admit the patient for treatment of elevated Potassium of 7.5. Patient was scheduled for outpatient AV fistulagram today but it will be delayed until we obtain a lower potassium level. A potassium level of 5.5 would be preferred.

## 2023-03-15 NOTE — Assessment & Plan Note (Addendum)
Hgb seems stable

## 2023-03-15 NOTE — Assessment & Plan Note (Addendum)
Baseline autistic disorder

## 2023-03-15 NOTE — Assessment & Plan Note (Addendum)
Noted thrombosed LUE vascular access  Pending vascular surgery evaluation for fistulogram and possible thrombectomy  Likely will have his procedure tomorrow

## 2023-03-15 NOTE — Assessment & Plan Note (Addendum)
Baseline ESRD on HD TTS  Missed HD Monday and Wednesday in setting of thrombosed LUE vascular access-  Patient received emergent dialysis with through a temporary catheter. -Nephrology is on board

## 2023-03-15 NOTE — Progress Notes (Signed)
Patient came in today for a thrombectomy of LUE, istat for K+ was 7.1, did a recheck via lab which was 7.5, Dr Edrick Kins aware and plan was to place a temp cath and admit patient, temp cath was placed in right groin triple lumen, no issues noted, patient VSS,  fistula to LUE with warm/red/and swollen, no thrill can be felt and a very faint bruit can be heard, Dr Alvester Morin came to beside and spoke with patient and patients father, ordered meds to help bring down K+ (see MAR), dialysis called and stated patient can come to dialysis now, report given to Dialysis RN and patient sent with belongings via stretcher, patients father has gone home but states he will be back.  Vidal Schwalbe, RN

## 2023-03-15 NOTE — Progress Notes (Signed)
[facility]  ROUNDING NOTE   Subjective:   Joseph Gilmore is a 52 y.o. male with a PMHx of autism, hypertension, and OC. He presents for outpatient procedure and evaluation of dialysis access. He was found to have an elevated potassium level, potassium 7.5.    Patient is known to Community education officer and receives outpatient dialysis at [facility] on a MWF schedule. Last treatment completed on Friday. Patient seen and evaluated during dialysis   HEMODIALYSIS FLOWSHEET:  Blood Flow Rate (mL/min): 400 mL/min Arterial Pressure (mmHg): -170 mmHg Venous Pressure (mmHg): 200 mmHg TMP (mmHg): 7 mmHg Ultrafiltration Rate (mL/min): 971 mL/min Dialysate Flow Rate (mL/min): 300 ml/min  Room air No lower extremity edema    Objective:  Vital signs in last 24 hours:  Temp:  [98 F (36.7 C)-98.2 F (36.8 C)] 98 F (36.7 C) (07/17 1236) Pulse Rate:  [75-86] 81 (07/17 1300) Resp:  [16-20] 18 (07/17 1300) BP: (126-148)/(74-96) 127/82 (07/17 1300) SpO2:  [98 %-100 %] 100 % (07/17 1300) Weight:  [102.1 kg] 102.1 kg (07/17 0826)  Weight change:  Filed Weights   03/15/23 0826  Weight: 102.1 kg    Intake/Output: No intake/output data recorded.   Intake/Output this shift:  No intake/output data recorded.  Physical Exam: General: NAD  Head: Normocephalic, atraumatic. Moist oral mucosal membranes  Eyes: Anicteric  Lungs:  Clear to auscultation, room air  Heart: Regular rate and rhythm  Abdomen:  Soft, nontender  Extremities:  trace peripheral edema.  Neurologic: Nonfocal, moving all four extremities  Skin: No lesions  Access: Rt femoral HD temp cath    Basic Metabolic Panel: Recent Labs  Lab 03/15/23 0932  NA 138  K 7.5*  7.5*  CL 104  CO2 17*  GLUCOSE 79  BUN 87*  CREATININE 21.87*  CALCIUM 7.2*    Liver Function Tests: Recent Labs  Lab 03/15/23 0932  AST 22  ALT 16  ALKPHOS 56  BILITOT 0.7  PROT 7.0  ALBUMIN 3.7   No results for input(s):  "LIPASE", "AMYLASE" in the last 168 hours. No results for input(s): "AMMONIA" in the last 168 hours.  CBC: No results for input(s): "WBC", "NEUTROABS", "HGB", "HCT", "MCV", "PLT" in the last 168 hours.  Cardiac Enzymes: No results for input(s): "CKTOTAL", "CKMB", "CKMBINDEX", "TROPONINI" in the last 168 hours.  BNP: Invalid input(s): "POCBNP"  CBG: No results for input(s): "GLUCAP" in the last 168 hours.  Microbiology: Results for orders placed or performed during the hospital encounter of 12/09/20  SARS CORONAVIRUS 2 (TAT 6-24 HRS) Nasopharyngeal Nasopharyngeal Swab     Status: None   Collection Time: 12/09/20 10:19 AM   Specimen: Nasopharyngeal Swab  Result Value Ref Range Status   SARS Coronavirus 2 NEGATIVE NEGATIVE Final    Comment: (NOTE) SARS-CoV-2 target nucleic acids are NOT DETECTED.  The SARS-CoV-2 RNA is generally detectable in upper and lower respiratory specimens during the acute phase of infection. Negative results do not preclude SARS-CoV-2 infection, do not rule out co-infections with other pathogens, and should not be used as the sole basis for treatment or other patient management decisions. Negative results must be combined with clinical observations, patient history, and epidemiological information. The expected result is Negative.  Fact Sheet for Patients: HairSlick.no  Fact Sheet for Healthcare Providers: quierodirigir.com  This test is not yet approved or cleared by the [place] FDA and  has been authorized for detection and/or diagnosis of SARS-CoV-2 by FDA under an Emergency Use Authorization (EUA).  This EUA will remain  in effect (meaning this test can be used) for the duration of the COVID-19 declaration under Se ction 564(b)(1) of the Act, 21 U.S.C. section 360bbb-3(b)(1), unless the authorization is terminated or revoked sooner.  Performed at [facility], [address] [place], [place] [address]     Coagulation Studies: No results for input(s): "LABPROT", "INR" in the last 72 hours.  Urinalysis: No results for input(s): "COLORURINE", "LABSPEC", "PHURINE", "GLUCOSEU", "HGBUR", "BILIRUBINUR", "KETONESUR", "PROTEINUR", "UROBILINOGEN", "NITRITE", "LEUKOCYTESUR" in the last 72 hours.  Invalid input(s): "APPERANCEUR"    Imaging: PERIPHERAL VASCULAR CATHETERIZATION  Result Date: 03/15/2023 See surgical note for result.    Medications:    sodium chloride      enoxaparin (LOVENOX) injection  30 mg Subcutaneous Q24H   sodium chloride flush  3 mL Intravenous Q12H   sodium chloride, HYDROmorphone (DILAUDID) injection, ondansetron (ZOFRAN) IV, sodium chloride flush  Assessment/ Plan:  Joseph Gilmore is a 52 y.o.  male with a PMHx of autism, hypertension, and OC. He presents for outpatient procedure and evaluation of dialysis access. He was found to have an elevated potassium level, potassium 7.5.    End stage renal disease with hyperkalemia on hemodialysis. Last treatment on Friday. Potassium 7.5. Vascular placed a right temp cath. We will provide dialysis on a 1K bath.   2. Malfunctioning dialysis access. Vascular to perform fistulagram today. Potassium elevated. Will defer procedure until potassium 5.5 or less.   3. Anemia of chronic kidney disease Lab Results  Component Value Date   HGB 11.2 (L) 12/11/2020    Hgb stable. Patient prescribed Mircera outpatient .  4. Secondary Hyperparathyroidism: with outpatient labs: PTH 164, phosphorus 3.2, calcium 7.9 on 02/28/23.   Lab Results  Component Value Date   PTH 99 (H) 04/03/2020   CALCIUM 7.2 (L) 03/15/2023   CAION 1.04 (L) 12/11/2020   PHOS 5.3 (H) 04/08/2020      LOS: 0 Joseph Gilmore 7/17/20242:17 PM

## 2023-03-16 ENCOUNTER — Encounter: Payer: Self-pay | Admitting: Vascular Surgery

## 2023-03-16 DIAGNOSIS — D631 Anemia in chronic kidney disease: Secondary | ICD-10-CM

## 2023-03-16 DIAGNOSIS — F840 Autistic disorder: Secondary | ICD-10-CM | POA: Diagnosis not present

## 2023-03-16 DIAGNOSIS — T829XXS Unspecified complication of cardiac and vascular prosthetic device, implant and graft, sequela: Secondary | ICD-10-CM | POA: Diagnosis not present

## 2023-03-16 DIAGNOSIS — N186 End stage renal disease: Secondary | ICD-10-CM

## 2023-03-16 DIAGNOSIS — Z992 Dependence on renal dialysis: Secondary | ICD-10-CM

## 2023-03-16 DIAGNOSIS — E875 Hyperkalemia: Secondary | ICD-10-CM | POA: Diagnosis not present

## 2023-03-16 DIAGNOSIS — N400 Enlarged prostate without lower urinary tract symptoms: Secondary | ICD-10-CM

## 2023-03-16 LAB — COMPREHENSIVE METABOLIC PANEL
ALT: 13 U/L (ref 0–44)
AST: 15 U/L (ref 15–41)
Albumin: 3.2 g/dL — ABNORMAL LOW (ref 3.5–5.0)
Alkaline Phosphatase: 50 U/L (ref 38–126)
Anion gap: 15 (ref 5–15)
BUN: 51 mg/dL — ABNORMAL HIGH (ref 6–20)
CO2: 25 mmol/L (ref 22–32)
Calcium: 7.3 mg/dL — ABNORMAL LOW (ref 8.9–10.3)
Chloride: 96 mmol/L — ABNORMAL LOW (ref 98–111)
Creatinine, Ser: 14.13 mg/dL — ABNORMAL HIGH (ref 0.61–1.24)
GFR, Estimated: 4 mL/min — ABNORMAL LOW (ref >60)
Glucose, Bld: 92 mg/dL (ref 70–99)
Potassium: 4.2 mmol/L (ref 3.5–5.1)
Sodium: 136 mmol/L (ref 135–145)
Total Bilirubin: 0.6 mg/dL (ref 0.3–1.2)
Total Protein: 6.5 g/dL (ref 6.5–8.1)

## 2023-03-16 LAB — CBC
HCT: 29.6 % — ABNORMAL LOW (ref 39.0–52.0)
Hemoglobin: 9.8 g/dL — ABNORMAL LOW (ref 13.0–17.0)
MCH: 31.6 pg (ref 26.0–34.0)
MCHC: 33.1 g/dL (ref 30.0–36.0)
MCV: 95.5 fL (ref 80.0–100.0)
Platelets: 185 10*3/uL (ref 150–400)
RBC: 3.1 MIL/uL — ABNORMAL LOW (ref 4.22–5.81)
RDW: 17.7 % — ABNORMAL HIGH (ref 11.5–15.5)
WBC: 9 10*3/uL (ref 4.0–10.5)
nRBC: 0 % (ref 0.0–0.2)

## 2023-03-16 LAB — HEPATITIS B SURFACE ANTIBODY, QUANTITATIVE: Hep B S AB Quant (Post): 460 m[IU]/mL

## 2023-03-16 LAB — POTASSIUM
Potassium: 4.6 mmol/L (ref 3.5–5.1)
Potassium: 4.1 mmol/L (ref 3.5–5.1)

## 2023-03-16 MED ORDER — HEPARIN SODIUM (PORCINE) 5000 UNIT/ML IJ SOLN
5000.0000 [IU] | Freq: Three times a day (TID) | INTRAMUSCULAR | Status: DC
  Administered 2023-03-16 – 2023-03-17 (×2): 5000 [IU] via SUBCUTANEOUS
  Filled 2023-03-16 (×2): qty 1

## 2023-03-16 MED ORDER — LIDOCAINE-EPINEPHRINE (PF) 1 %-1:200000 IJ SOLN
INTRAMUSCULAR | Status: DC | PRN
  Administered 2023-03-15: 10 mL

## 2023-03-16 NOTE — Discharge Planning (Addendum)
ESTBLISHED HEMODIALYSIS Outpatient Facility  [facility]  [address]  [place] [address] [phone]  Schedule: TTS 12:10pm  Dimas Chyle Dialysis Coordinator II  [facility] Cell: [phone] eFax: [phone] [email or web address]

## 2023-03-16 NOTE — Progress Notes (Signed)
  Progress Note   Patient: Joseph Gilmore ZOX:[id number] DOB: Dec 28, 1970 DOA: 03/15/2023     1 DOS: the patient was seen and examined on 03/16/2023   Brief hospital course: Taken from H&P.  Tedric Leeth Hofstra Gilmore is a 52 y.o. male with medical history significant of morbid obesity, autism, ESRD on HD TTS, HTN, OCD presenting w/ hyperkalemia with potassium of 7.5.  EKG normal Last dialysis was on Friday.  Seems like missed Monday. Was being evaluated today by Dr. Gilda Crease with vascular surgery for possible thrombectomy of left upper extremity, presented to Cath Lab, hemodynamically stable but noted to have potassium of 7.5.  Nephrology was consulted and patient was taken for emergent dialysis. Also found to have malfunctioning dialysis access, vascular placed right temporary catheter.  7/18: Vital stable, potassium at 4.6.  Vascular to do fistulogram for malfunctioning fistula tomorrow. Going for another session of dialysis today.       Assessment and Plan: * Hyperkalemia K 7.5 in setting of missed HD on admission, has been improved with emergent dialysis EKG was normal. Received hyperkalemia protocol w/ IV calcium, insulin, albuterol, lokelma  -Continue to monitor  Complication of vascular access for dialysis Noted thrombosed LUE vascular access  Pending vascular surgery evaluation for fistulogram and possible thrombectomy  Likely will have his procedure tomorrow   ESRD on hemodialysis (HCC) Baseline ESRD on HD TTS  Missed HD Monday and Wednesday in setting of thrombosed LUE vascular access-  Patient received emergent dialysis with through a temporary catheter. -Nephrology is on board    Benign prostatic hyperplasia without lower urinary tract symptoms Cont flomax and dutestaride   Autistic disorder Baseline autistic disorder     Anemia in chronic kidney disease Hgb seems stable    Subjective: Patient was lying down comfortably when seen today.  No new concern or  pain.  Physical Exam: Vitals:   03/16/23 1229 03/16/23 1345 03/16/23 1357 03/16/23 1400  BP: 110/71 111/81 107/78 (!) 103/56  Pulse: 86 83 79 78  Resp: 16 15 13 13   Temp:  98.7 F (37.1 C)    TempSrc:  Oral    SpO2: 100% 99% 100% 99%  Weight:  124.5 kg    Height:       General.  Morbidly obese gentleman, in no acute distress. Pulmonary.  Lungs clear bilaterally, normal respiratory effort. CV.  Regular rate and rhythm, no JVD, rub or murmur. Abdomen.  Soft, nontender, nondistended, BS positive. CNS.  Alert and oriented .  No focal neurologic deficit. Extremities.  No edema, no cyanosis, pulses intact and symmetrical. Psychiatry.  Appears to have some cognitive impairment  Data Reviewed: Prior data reviewed.  Family Communication:   Disposition: Status is: Inpatient Remains inpatient appropriate because: Severity of illness  Planned Discharge Destination: Home  DVT prophylaxis.  Subcu heparin Time spent: 40 minutes  This record has been created using Conservation officer, historic buildings. Errors have been sought and corrected,but may not always be located. Such creation errors do not reflect on the standard of care.   Author: Arnetha Courser, MD 03/16/2023 2:13 PM  For on call review [email or web address].

## 2023-03-16 NOTE — Progress Notes (Signed)
1635: Patient's venous line clotted. Tx paused, awaiting new set up.

## 2023-03-16 NOTE — Progress Notes (Signed)
[facility]  ROUNDING NOTE   Subjective:   Joseph Gilmore is a 52 y.o. male with a PMHx of autism, hypertension, and OC. He presents for outpatient procedure and evaluation of dialysis access. He was found to have an elevated potassium level, potassium 7.5.    Patient is known to Community education officer and receives outpatient dialysis at [facility] on a MWF schedule. \  Patient seen resting quietly No complaints to offer Room air   Objective:  Vital signs in last 24 hours:  Temp:  [98.4 F (36.9 C)-99.9 F (37.7 C)] 99.4 F (37.4 C) (07/18 1229) Pulse Rate:  [68-89] 86 (07/18 1229) Resp:  [13-20] 16 (07/18 1229) BP: (101-128)/(71-94) 110/71 (07/18 1229) SpO2:  [96 %-100 %] 100 % (07/18 1229)  Weight change:  Filed Weights   03/15/23 0826  Weight: 102.1 kg    Intake/Output: I/O last 3 completed shifts: In: -  Out: 2500 [Other:2500]   Intake/Output this shift:  Total I/O In: 594 [P.O.:594] Out: -   Physical Exam: General: NAD  Head: Normocephalic, atraumatic. Moist oral mucosal membranes  Eyes: Anicteric  Lungs:  Clear to auscultation, room air  Heart: Regular rate and rhythm  Abdomen:  Soft, nontender  Extremities:  trace peripheral edema.  Neurologic: Nonfocal, moving all four extremities  Skin: No lesions  Access: Rt femoral HD temp cath    Basic Metabolic Panel: Recent Labs  Lab 03/15/23 0932 03/15/23 1858 03/15/23 2308 03/16/23 0307 03/16/23 0718 03/16/23 1111  NA 138 135  --  136  --   --   K 7.5*  7.5* 3.7 4.0 4.2 4.6 4.1  CL 104 95*  --  96*  --   --   CO2 17* 24  --  25  --   --   GLUCOSE 79 122*  --  92  --   --   BUN 87* 44*  --  51*  --   --   CREATININE 21.87* 13.26*  --  14.13*  --   --   CALCIUM 7.2* 7.6*  --  7.3*  --   --     Liver Function Tests: Recent Labs  Lab 03/15/23 0932 03/16/23 0307  AST 22 15  ALT 16 13  ALKPHOS 56 50  BILITOT 0.7 0.6  PROT 7.0 6.5  ALBUMIN 3.7 3.2*   No results for input(s):  "LIPASE", "AMYLASE" in the last 168 hours. No results for input(s): "AMMONIA" in the last 168 hours.  CBC: Recent Labs  Lab 03/15/23 1510 03/16/23 0307  WBC 8.6 9.0  HGB 9.0* 9.8*  HCT 27.5* 29.6*  MCV 98.6 95.5  PLT 185 185    Cardiac Enzymes: No results for input(s): "CKTOTAL", "CKMB", "CKMBINDEX", "TROPONINI" in the last 168 hours.  BNP: Invalid input(s): "POCBNP"  CBG: No results for input(s): "GLUCAP" in the last 168 hours.  Microbiology: Results for orders placed or performed during the hospital encounter of 12/09/20  SARS CORONAVIRUS 2 (TAT 6-24 HRS) Nasopharyngeal Nasopharyngeal Swab     Status: None   Collection Time: 12/09/20 10:19 AM   Specimen: Nasopharyngeal Swab  Result Value Ref Range Status   SARS Coronavirus 2 NEGATIVE NEGATIVE Final    Comment: (NOTE) SARS-CoV-2 target nucleic acids are NOT DETECTED.  The SARS-CoV-2 RNA is generally detectable in upper and lower respiratory specimens during the acute phase of infection. Negative results do not preclude SARS-CoV-2 infection, do not rule out co-infections with other pathogens, and should not be used  as the sole basis for treatment or other patient management decisions. Negative results must be combined with clinical observations, patient history, and epidemiological information. The expected result is Negative.  Fact Sheet for Patients: HairSlick.no  Fact Sheet for Healthcare Providers: quierodirigir.com  This test is not yet approved or cleared by the [place] FDA and  has been authorized for detection and/or diagnosis of SARS-CoV-2 by FDA under an Emergency Use Authorization (EUA). This EUA will remain  in effect (meaning this test can be used) for the duration of the COVID-19 declaration under Se ction 564(b)(1) of the Act, 21 U.S.C. section 360bbb-3(b)(1), unless the authorization is terminated or revoked sooner.  Performed at [facility], [address], [place], [place] [address]     Coagulation Studies: No results for input(s): "LABPROT", "INR" in the last 72 hours.  Urinalysis: No results for input(s): "COLORURINE", "LABSPEC", "PHURINE", "GLUCOSEU", "HGBUR", "BILIRUBINUR", "KETONESUR", "PROTEINUR", "UROBILINOGEN", "NITRITE", "LEUKOCYTESUR" in the last 72 hours.  Invalid input(s): "APPERANCEUR"    Imaging: PERIPHERAL VASCULAR CATHETERIZATION  Result Date: 03/15/2023 See surgical note for result.    Medications:    sodium chloride      heparin injection (subcutaneous)  5,000 Units Subcutaneous Q8H   sodium chloride flush  3 mL Intravenous Q12H   sodium chloride, HYDROmorphone (DILAUDID) injection, ondansetron (ZOFRAN) IV, sodium chloride flush  Assessment/ Plan:  Mr. Joseph Gilmore is a 52 y.o.  male with a PMHx of autism, hypertension, and OC. He presents for outpatient procedure and evaluation of dialysis access. He was found to have an elevated potassium level, potassium 7.5.    [facility]/left aVF  End stage renal disease with hyperkalemia on hemodialysis.  Patient received urgent dialysis to manage hyperkalemia.  Creatinine potassium corrected to 4.1.  Will receive dialysis treatment today to maintain outpatient schedule.  2. Malfunctioning dialysis access.  Potassium corrected with dialysis.  Vascular team to perform fistulogram tomorrow.  3. Anemia of chronic kidney disease Lab Results  Component Value Date   HGB 9.8 (L) 03/16/2023    Patient prescribed Mircera outpatient .  Hemoglobin within desired range.  4. Secondary Hyperparathyroidism: with outpatient labs: PTH 164, phosphorus 3.2, calcium 7.9 on 02/28/23.   Lab Results  Component Value Date   PTH 99 (H) 04/03/2020   CALCIUM 7.3 (L) 03/16/2023   CAION 1.04 (L) 12/11/2020   PHOS 5.3 (H) 04/08/2020  Patient prescribed cholecalciferol and calcium acetate outpatient. Corrected calcium 7.9.  Phosphorus  acceptable.   LOS: 1 Darely Becknell 7/18/20241:41 PM

## 2023-03-16 NOTE — Care Management Important Message (Signed)
Important Message  Patient Details  Name: Joseph Gilmore MRN: [id number] Date of Birth: [birth year]   Medicare Important Message Given:  N/A - LOS <3 / Initial given by admissions     Johnell Comings 03/16/2023, 2:31 PM

## 2023-03-16 NOTE — Hospital Course (Addendum)
Taken from H&P.  Joseph Gilmore is a 52 y.o. male with medical history significant of morbid obesity, autism, ESRD on HD TTS, HTN, OCD presenting w/ hyperkalemia with potassium of 7.5.  EKG normal Last dialysis was on Friday.  Seems like missed Monday. Was being evaluated today by Dr. Gilda Crease with vascular surgery for possible thrombectomy of left upper extremity, presented to Cath Lab, hemodynamically stable but noted to have potassium of 7.5.  Nephrology was consulted and patient was taken for emergent dialysis. Also found to have malfunctioning dialysis access, vascular placed right temporary catheter.  7/18: Vital stable, potassium at 4.6.  Vascular to do fistulogram for malfunctioning fistula tomorrow. Going for another session of dialysis today.  7/19: Remained stable, K at 4.1. Had perm. Lt internal jugular cath placed by vascular for HD. He will continue with his routine dialysis and follow up with Vascular as out patient to keep working with malfunctioning/thrombosed fistula.

## 2023-03-16 NOTE — Progress Notes (Signed)
Arrived to RR in dialysis. Resp there, ICU RN there, ED charge RN there. Pt with spontaneous respirations.

## 2023-03-16 NOTE — Progress Notes (Signed)
Hemodialysis note  Received patient in bed to unit. Alert and oriented.  Informed consent signed and in chart.  Treatment initiated: 1354 Treatment completed: 1819  Patient tolerated well. Transported back to room, alert without acute distress.  Report given to patient's RN.   Access used: Right Femoral HD Catheter Access issues: High arterial pressures. Lines were switched during tx  Total UF removed: 1.5 L Medication(s) given:  none Post HD VS: none Post HD weight: 123 kg   [facility]

## 2023-03-17 ENCOUNTER — Ambulatory Visit (INDEPENDENT_AMBULATORY_CARE_PROVIDER_SITE_OTHER): Payer: Medicare HMO | Admitting: Nurse Practitioner

## 2023-03-17 ENCOUNTER — Encounter (INDEPENDENT_AMBULATORY_CARE_PROVIDER_SITE_OTHER): Payer: Medicare HMO

## 2023-03-17 ENCOUNTER — Encounter
Admission: AD | Disposition: A | Discharge status: Home / Self Care | Payer: Self-pay | Source: Home / Self Care | Attending: Internal Medicine

## 2023-03-17 DIAGNOSIS — E875 Hyperkalemia: Secondary | ICD-10-CM | POA: Diagnosis not present

## 2023-03-17 DIAGNOSIS — N186 End stage renal disease: Secondary | ICD-10-CM | POA: Diagnosis not present

## 2023-03-17 DIAGNOSIS — F840 Autistic disorder: Secondary | ICD-10-CM | POA: Diagnosis not present

## 2023-03-17 DIAGNOSIS — Z992 Dependence on renal dialysis: Secondary | ICD-10-CM | POA: Diagnosis not present

## 2023-03-17 DIAGNOSIS — T829XXS Unspecified complication of cardiac and vascular prosthetic device, implant and graft, sequela: Secondary | ICD-10-CM | POA: Diagnosis not present

## 2023-03-17 HISTORY — PX: DIALYSIS/PERMA CATHETER INSERTION: CATH118288

## 2023-03-17 LAB — BASIC METABOLIC PANEL
Anion gap: 16 — ABNORMAL HIGH (ref 5–15)
BUN: 36 mg/dL — ABNORMAL HIGH (ref 6–20)
CO2: 25 mmol/L (ref 22–32)
Calcium: 7.3 mg/dL — ABNORMAL LOW (ref 8.9–10.3)
Chloride: 94 mmol/L — ABNORMAL LOW (ref 98–111)
Creatinine, Ser: 12.25 mg/dL — ABNORMAL HIGH (ref 0.61–1.24)
GFR, Estimated: 5 mL/min — ABNORMAL LOW (ref >60)
Glucose, Bld: 156 mg/dL — ABNORMAL HIGH (ref 70–99)
Potassium: 4.1 mmol/L (ref 3.5–5.1)
Sodium: 135 mmol/L (ref 135–145)

## 2023-03-17 SURGERY — DIALYSIS/PERMA CATHETER INSERTION

## 2023-03-17 MED ORDER — FENTANYL CITRATE (PF) 100 MCG/2ML IJ SOLN
INTRAMUSCULAR | Status: DC | PRN
  Administered 2023-03-17: 50 ug via INTRAVENOUS
  Administered 2023-03-17: 25 ug via INTRAVENOUS

## 2023-03-17 MED ORDER — DIPHENHYDRAMINE HCL 50 MG/ML IJ SOLN: 50.0000 mg | Freq: Once | INTRAMUSCULAR | Status: DC | PRN

## 2023-03-17 MED ORDER — MIDAZOLAM HCL 2 MG/2ML IJ SOLN
INTRAMUSCULAR | Status: DC | PRN
  Administered 2023-03-17: 2 mg via INTRAVENOUS
  Administered 2023-03-17: 1 mg via INTRAVENOUS

## 2023-03-17 MED ORDER — METHYLPREDNISOLONE SODIUM SUCC 125 MG IJ SOLR: 125.0000 mg | Freq: Once | INTRAMUSCULAR | Status: DC | PRN

## 2023-03-17 MED ORDER — FENTANYL CITRATE PF 50 MCG/ML IJ SOSY
PREFILLED_SYRINGE | INTRAMUSCULAR | Status: AC
  Filled 2023-03-17: qty 1

## 2023-03-17 MED ORDER — HEPARIN (PORCINE) IN NACL 1000-0.9 UT/500ML-% IV SOLN
INTRAVENOUS | Status: DC | PRN
  Administered 2023-03-17: 500 mL

## 2023-03-17 MED ORDER — CEFAZOLIN SODIUM-DEXTROSE 1-4 GM/50ML-% IV SOLN
1.0000 g | INTRAVENOUS | Status: AC
  Administered 2023-03-17: 1 g via INTRAVENOUS

## 2023-03-17 MED ORDER — LIDOCAINE-EPINEPHRINE (PF) 1 %-1:200000 IJ SOLN
INTRAMUSCULAR | Status: DC | PRN
  Administered 2023-03-17: 40 mL

## 2023-03-17 MED ORDER — ONDANSETRON HCL 4 MG/2ML IJ SOLN: 4.0000 mg | Freq: Four times a day (QID) | INTRAMUSCULAR | Status: DC | PRN

## 2023-03-17 MED ORDER — FENTANYL CITRATE PF 50 MCG/ML IJ SOSY: 12.5000 ug | PREFILLED_SYRINGE | Freq: Once | INTRAMUSCULAR | Status: DC | PRN

## 2023-03-17 MED ORDER — MIDAZOLAM HCL 2 MG/ML PO SYRP: 8.0000 mg | ORAL_SOLUTION | Freq: Once | ORAL | Status: DC | PRN

## 2023-03-17 MED ORDER — CEFAZOLIN SODIUM-DEXTROSE 1-4 GM/50ML-% IV SOLN
INTRAVENOUS | Status: AC
  Filled 2023-03-17: qty 50

## 2023-03-17 MED ORDER — MIDAZOLAM HCL 2 MG/2ML IJ SOLN
INTRAMUSCULAR | Status: AC
  Filled 2023-03-17: qty 2

## 2023-03-17 MED ORDER — SODIUM CHLORIDE 0.9 % IV SOLN: INTRAVENOUS | Status: DC

## 2023-03-17 MED ORDER — FAMOTIDINE 20 MG PO TABS: 40.0000 mg | ORAL_TABLET | Freq: Once | ORAL | Status: DC | PRN

## 2023-03-17 MED ORDER — HYDROMORPHONE HCL 1 MG/ML IJ SOLN: 1.0000 mg | Freq: Once | INTRAMUSCULAR | Status: DC | PRN

## 2023-03-17 SURGICAL SUPPLY — 14 items
ADH SKN CLS APL DERMABOND .7 (GAUZE/BANDAGES/DRESSINGS) ×2
BIOPATCH RED 1 DISK 7.0 (GAUZE/BANDAGES/DRESSINGS) ×1 IMPLANT
CATH CANNON HEMO 15FR 23CM (HEMODIALYSIS SUPPLIES) ×1 IMPLANT
COVER PROBE ULTRASOUND 5X96 (MISCELLANEOUS) ×1 IMPLANT
DERMABOND ADVANCED .7 DNX12 (GAUZE/BANDAGES/DRESSINGS) ×1 IMPLANT
DRAPE INCISE IOBAN 66X45 STRL (DRAPES) ×1 IMPLANT
GLIDEWIRE STIFF .35X180X3 HYDR (WIRE) ×2 IMPLANT
GUIDEWIRE SUPER STIFF .035X180 (WIRE) ×1 IMPLANT
NDL ENTRY 21GA 7CM ECHOTIP (NEEDLE) IMPLANT
NEEDLE ENTRY 21GA 7CM ECHOTIP (NEEDLE) ×2 IMPLANT
PACK ANGIOGRAPHY (CUSTOM PROCEDURE TRAY) ×2 IMPLANT
SET INTRO CAPELLA COAXIAL (SET/KITS/TRAYS/PACK) ×1 IMPLANT
SUT MNCRL AB 4-0 PS2 18 (SUTURE) ×2 IMPLANT
SUT SILK 0 FSL (SUTURE) ×1 IMPLANT

## 2023-03-17 NOTE — Progress Notes (Signed)
[facility]  ROUNDING NOTE   Subjective:   Joseph Gilmore Gilmore is a 52 y.o. male with a PMHx of autism, hypertension, and OC. He presents for outpatient procedure and evaluation of dialysis access. He was found to have an elevated potassium level, potassium 7.5.    Patient is known to Community education officer and receives outpatient dialysis at [facility] on a MWF schedule. \  Patient seen laying in bed Post procedure, permcath placed by vascular Denies pain or discomfort   Objective:  Vital signs in last 24 hours:  Temp:  [98.4 F (36.9 C)-98.7 F (37.1 C)] 98.4 F (36.9 C) (07/19 0912) Pulse Rate:  [73-108] 108 (07/19 1130) Resp:  [12-24] 24 (07/19 1130) BP: (100-168)/(56-110) 155/110 (07/19 1130) SpO2:  [95 %-100 %] 97 % (07/19 1115) Weight:  [123 kg-124.5 kg] 123 kg (07/19 0912)  Weight change: 22.4 kg Filed Weights   03/16/23 1345 03/16/23 1819 03/17/23 0912  Weight: 124.5 kg 123 kg 123 kg    Intake/Output: I/O last 3 completed shifts: In: 594 [P.O.:594] Out: 1500 [Other:1500]   Intake/Output this shift:  No intake/output data recorded.  Physical Exam: General: NAD  Head: Normocephalic, atraumatic. Moist oral mucosal membranes  Eyes: Anicteric  Lungs:  Clear to auscultation, room air  Heart: Regular rate and rhythm  Abdomen:  Soft, nontender  Extremities:  trace peripheral edema.  Neurologic: Nonfocal, moving all four extremities  Skin: No lesions  Access: Rt femoral HD temp cath, Rt chest permcath placed on 03/17/23    Basic Metabolic Panel: Recent Labs  Lab 03/15/23 0932 03/15/23 1858 03/15/23 2308 03/16/23 0307 03/16/23 0718 03/16/23 1111  NA 138 135  --  136  --   --   K 7.5*  7.5* 3.7 4.0 4.2 4.6 4.1  CL 104 95*  --  96*  --   --   CO2 17* 24  --  25  --   --   GLUCOSE 79 122*  --  92  --   --   BUN 87* 44*  --  51*  --   --   CREATININE 21.87* 13.26*  --  14.13*  --   --   CALCIUM 7.2* 7.6*  --  7.3*  --   --     Liver Function  Tests: Recent Labs  Lab 03/15/23 0932 03/16/23 0307  AST 22 15  ALT 16 13  ALKPHOS 56 50  BILITOT 0.7 0.6  PROT 7.0 6.5  ALBUMIN 3.7 3.2*   No results for input(s): "LIPASE", "AMYLASE" in the last 168 hours. No results for input(s): "AMMONIA" in the last 168 hours.  CBC: Recent Labs  Lab 03/15/23 1510 03/16/23 0307  WBC 8.6 9.0  HGB 9.0* 9.8*  HCT 27.5* 29.6*  MCV 98.6 95.5  PLT 185 185    Cardiac Enzymes: No results for input(s): "CKTOTAL", "CKMB", "CKMBINDEX", "TROPONINI" in the last 168 hours.  BNP: Invalid input(s): "POCBNP"  CBG: No results for input(s): "GLUCAP" in the last 168 hours.  Microbiology: Results for orders placed or performed during the hospital encounter of 12/09/20  SARS CORONAVIRUS 2 (TAT 6-24 HRS) Nasopharyngeal Nasopharyngeal Swab     Status: None   Collection Time: 12/09/20 10:19 AM   Specimen: Nasopharyngeal Swab  Result Value Ref Range Status   SARS Coronavirus 2 NEGATIVE NEGATIVE Final    Comment: (NOTE) SARS-CoV-2 target nucleic acids are NOT DETECTED.  The SARS-CoV-2 RNA is generally detectable in upper and lower respiratory specimens during  the acute phase of infection. Negative results do not preclude SARS-CoV-2 infection, do not rule out co-infections with other pathogens, and should not be used as the sole basis for treatment or other patient management decisions. Negative results must be combined with clinical observations, patient history, and epidemiological information. The expected result is Negative.  Fact Sheet for Patients: HairSlick.no  Fact Sheet for Healthcare Providers: quierodirigir.com  This test is not yet approved or cleared by the [place] FDA and  has been authorized for detection and/or diagnosis of SARS-CoV-2 by FDA under an Emergency Use Authorization (EUA). This EUA will remain  in effect (meaning this test can be used) for the duration of  the COVID-19 declaration under Se ction 564(b)(1) of the Act, 21 U.S.C. section 360bbb-3(b)(1), unless the authorization is terminated or revoked sooner.  Performed at [facility], [address], [place], [place] [address]     Coagulation Studies: No results for input(s): "LABPROT", "INR" in the last 72 hours.  Urinalysis: No results for input(s): "COLORURINE", "LABSPEC", "PHURINE", "GLUCOSEU", "HGBUR", "BILIRUBINUR", "KETONESUR", "PROTEINUR", "UROBILINOGEN", "NITRITE", "LEUKOCYTESUR" in the last 72 hours.  Invalid input(s): "APPERANCEUR"    Imaging: PERIPHERAL VASCULAR CATHETERIZATION  Result Date: 03/17/2023 See surgical note for result.    Medications:    sodium chloride      heparin injection (subcutaneous)  5,000 Units Subcutaneous Q8H   sodium chloride flush  3 mL Intravenous Q12H   sodium chloride, HYDROmorphone (DILAUDID) injection, ondansetron (ZOFRAN) IV, sodium chloride flush  Assessment/ Plan:  Mr. Joseph Gilmore is a 52 y.o.  male with a PMHx of autism, hypertension, and OC. He presents for outpatient procedure and evaluation of dialysis access. He was found to have an elevated potassium level, potassium 7.5.    [facility]/TTS/left aVF  End stage renal disease with hyperkalemia on hemodialysis. Potassium corrected. Treatment tolerated well yesterday. Next treatment scheduled for Saturday. Patient cleared to discharge from renal stance.   2. Malfunctioning dialysis access.  Potassium corrected with dialysis.  Vascular placed permcath and will perform needed interventions on AVF outpatient.   3. Anemia of chronic kidney disease Lab Results  Component Value Date   HGB 9.8 (L) 03/16/2023    Patient prescribed Mircera outpatient .  Hemoglobin at goal  4. Secondary Hyperparathyroidism: with outpatient labs: PTH 164, phosphorus 3.2, calcium 7.9 on 02/28/23.   Lab Results  Component Value Date   PTH 99 (H) 04/03/2020   CALCIUM 7.3 (L)  03/16/2023   CAION 1.04 (L) 12/11/2020   PHOS 5.3 (H) 04/08/2020  Patient prescribed cholecalciferol and calcium acetate outpatient. Will continue to monitor labs during this admission.   LOS: 2 Alyzza Andringa 7/19/202412:11 PM

## 2023-03-17 NOTE — Interval H&P Note (Signed)
History and Physical Interval Note:  03/17/2023 11:52 AM  Joseph Gilmore  has presented today for surgery, with the diagnosis of L arm thrombectomy   End Stage Renal.  The various methods of treatment have been discussed with the patient and family. After consideration of risks, benefits and other options for treatment, the patient has consented to  Procedure(s): DIALYSIS/PERMA CATHETER INSERTION (N/A) as a surgical intervention.  The patient's history has been reviewed, patient examined, no change in status, stable for surgery.  I have reviewed the patient's chart and labs.  Questions were answered to the patient's satisfaction.     Levora Dredge

## 2023-03-17 NOTE — Interval H&P Note (Signed)
History and Physical Interval Note:  03/17/2023 9:44 AM  Joseph Gilmore  has presented today for surgery, with the diagnosis of L arm thrombectomy   End Stage Renal.  The various methods of treatment have been discussed with the patient and family. After consideration of risks, benefits and other options for treatment, the patient has consented to  Procedure(s): DIALYSIS/PERMA CATHETER INSERTION (N/A) as a surgical intervention.  The patient's history has been reviewed, patient examined, no change in status, stable for surgery.  I have reviewed the patient's chart and labs.  Questions were answered to the patient's satisfaction.     Levora Dredge

## 2023-03-17 NOTE — Discharge Summary (Signed)
Physician Discharge Summary   Patient: Joseph Gilmore MRN: [id number] DOB: Jun 19, 1971  Admit date:     03/15/2023  Discharge date: 03/17/23  Discharge Physician: Arnetha Courser   PCP: Sherron Monday, MD   Recommendations at discharge:   Continue with HD on TTS Follow up with Vascular surgery  Discharge Diagnoses: Principal Problem:   Hyperkalemia Active Problems:   Complication of vascular access for dialysis   ESRD on hemodialysis (HCC)   Anemia in chronic kidney disease   Autistic disorder   Benign prostatic hyperplasia without lower urinary tract symptoms   Hospital Course: Taken from H&P.  Joseph Gilmore is a 52 y.o. male with medical history significant of morbid obesity, autism, ESRD on HD TTS, HTN, OCD presenting w/ hyperkalemia with potassium of 7.5.  EKG normal Last dialysis was on Friday.  Seems like missed Monday. Was being evaluated today by Dr. Gilda Crease with vascular surgery for possible thrombectomy of left upper extremity, presented to Cath Lab, hemodynamically stable but noted to have potassium of 7.5.  Nephrology was consulted and patient was taken for emergent dialysis. Also found to have malfunctioning dialysis access, vascular placed right temporary catheter.  7/18: Vital stable, potassium at 4.6.  Vascular to do fistulogram for malfunctioning fistula tomorrow. Going for another session of dialysis today.  7/19: Remained stable, K at 4.1. Had perm. Lt internal jugular cath placed by vascular for HD. He will continue with his routine dialysis and follow up with Vascular as out patient to keep working with malfunctioning/thrombosed fistula.     Assessment and Plan: * Hyperkalemia K 7.5 in setting of missed HD on admission, has been improved with emergent dialysis EKG was normal. Received hyperkalemia protocol w/ IV calcium, insulin, albuterol, lokelma  -Continue to monitor  Complication of vascular access for dialysis Noted thrombosed LUE  vascular access  Pending vascular surgery evaluation for fistulogram and possible thrombectomy  Likely will have his procedure tomorrow   ESRD on hemodialysis (HCC) Baseline ESRD on HD TTS  Missed HD Monday and Wednesday in setting of thrombosed LUE vascular access-  Patient received emergent dialysis with through a temporary catheter. -Nephrology is on board    Benign prostatic hyperplasia without lower urinary tract symptoms Cont flomax and dutestaride   Autistic disorder Baseline autistic disorder     Anemia in chronic kidney disease Hgb seems stable    Consultants: Nephrology                        Vascular Surgery Procedures performed: Perm internal jugular cath Placement for HD  Disposition: Home Diet recommendation:  Discharge Diet Orders (From admission, onward)     Start     Ordered   03/17/23 0000  Diet - low sodium heart healthy        03/17/23 1250           Regular diet DISCHARGE MEDICATION: Allergies as of 03/17/2023       Reactions   Chlorhexidine Other (See Comments)   Blisters (topical)        Medication List     TAKE these medications    calcium acetate 667 MG capsule Commonly known as: PHOSLO   dutasteride 0.5 MG capsule Commonly known as: AVODART Take 1 capsule (0.5 mg total) by mouth daily.   multivitamin Tabs tablet Take by mouth.   tamsulosin 0.4 MG Caps capsule Commonly known as: FLOMAX Take 1 capsule (0.4 mg total) by mouth daily.  Vitamin D-3 125 MCG (5000 UT) Tabs Take 5,000 Units by mouth daily.        Follow-up Information     Sherron Monday, MD. Schedule an appointment as soon as possible for a visit in 1 week(s).   Specialty: Internal Medicine Contact information: [address] [place] [address] [phone]                Discharge Exam: Ceasar Mons Weights   03/16/23 1345 03/16/23 1819 03/17/23 0912  Weight: 124.5 kg 123 kg 123 kg   General. Morbidly obese gentleman, In no acute  distress. Pulmonary.  Lungs clear bilaterally, normal respiratory effort. CV.  Regular rate and rhythm, no JVD, rub or murmur. Abdomen.  Soft, nontender, nondistended, BS positive. CNS.  Alert and oriented .  No focal neurologic deficit. Extremities.  No edema, no cyanosis, pulses intact and symmetrical. Psychiatry.  Appears to have some cognitive impairment.   Condition at discharge: stable  The results of significant diagnostics from this hospitalization (including imaging, microbiology, ancillary and laboratory) are listed below for reference.   Imaging Studies: PERIPHERAL VASCULAR CATHETERIZATION  Result Date: 03/17/2023 See surgical note for result.  PERIPHERAL VASCULAR CATHETERIZATION  Result Date: 03/15/2023 See surgical note for result.   Microbiology: Results for orders placed or performed during the hospital encounter of 12/09/20  SARS CORONAVIRUS 2 (TAT 6-24 HRS) Nasopharyngeal Nasopharyngeal Swab     Status: None   Collection Time: 12/09/20 10:19 AM   Specimen: Nasopharyngeal Swab  Result Value Ref Range Status   SARS Coronavirus 2 NEGATIVE NEGATIVE Final    Comment: (NOTE) SARS-CoV-2 target nucleic acids are NOT DETECTED.  The SARS-CoV-2 RNA is generally detectable in upper and lower respiratory specimens during the acute phase of infection. Negative results do not preclude SARS-CoV-2 infection, do not rule out co-infections with other pathogens, and should not be used as the sole basis for treatment or other patient management decisions. Negative results must be combined with clinical observations, patient history, and epidemiological information. The expected result is Negative.  Fact Sheet for Patients: HairSlick.no  Fact Sheet for Healthcare Providers: quierodirigir.com  This test is not yet approved or cleared by the [place] FDA and  has been authorized for detection and/or diagnosis of  SARS-CoV-2 by FDA under an Emergency Use Authorization (EUA). This EUA will remain  in effect (meaning this test can be used) for the duration of the COVID-19 declaration under Se ction 564(b)(1) of the Act, 21 U.S.C. section 360bbb-3(b)(1), unless the authorization is terminated or revoked sooner.  Performed at [facility], [address], [place], [place] [address]     Labs: CBC: Recent Labs  Lab 03/15/23 1510 03/16/23 0307  WBC 8.6 9.0  HGB 9.0* 9.8*  HCT 27.5* 29.6*  MCV 98.6 95.5  PLT 185 185   Basic Metabolic Panel: Recent Labs  Lab 03/15/23 0932 03/15/23 1858 03/15/23 2308 03/16/23 0307 03/16/23 0718 03/16/23 1111 03/17/23 1201  NA 138 135  --  136  --   --  135  K 7.5*  7.5* 3.7 4.0 4.2 4.6 4.1 4.1  CL 104 95*  --  96*  --   --  94*  CO2 17* 24  --  25  --   --  25  GLUCOSE 79 122*  --  92  --   --  156*  BUN 87* 44*  --  51*  --   --  36*  CREATININE 21.87* 13.26*  --  14.13*  --   --  12.25*  CALCIUM 7.2* 7.6*  --  7.3*  --   --  7.3*   Liver Function Tests: Recent Labs  Lab 03/15/23 0932 03/16/23 0307  AST 22 15  ALT 16 13  ALKPHOS 56 50  BILITOT 0.7 0.6  PROT 7.0 6.5  ALBUMIN 3.7 3.2*   CBG: No results for input(s): "GLUCAP" in the last 168 hours.  Discharge time spent: greater than 30 minutes.  This record has been created using Conservation officer, historic buildings. Errors have been sought and corrected,but may not always be located. Such creation errors do not reflect on the standard of care.   Signed: Arnetha Courser, MD [facility] 03/17/2023

## 2023-03-17 NOTE — Op Note (Signed)
[facility]   OPERATIVE NOTE     PROCEDURE: 1. Insertion of a Lt IJ tunneled dialysis catheter. 2. Catheter placement and cannulation under ultrasound and fluoroscopic guidance  PRE-OPERATIVE DIAGNOSIS: end-stage renal requiring hemodialysis  POST-OPERATIVE DIAGNOSIS: same as above  SURGEON: Levora Dredge  ANESTHESIA: Conscious sedation was administered under my direct supervision by the interventional radiology RN. IV Versed plus fentanyl were utilized. Continuous ECG, pulse oximetry and blood pressure was monitored throughout the entire procedure.  Conscious sedation was for a total of 41 minutes.  ESTIMATED BLOOD LOSS: Minimal  FINDING(S): 1.  Tips of the catheter in the right atrium on fluoroscopy 2.  No obvious pneumothorax on fluoroscopy  SPECIMEN(S):  none  INDICATIONS:   Joseph Gilmore II is a 52 y.o. male  presents with end stage renal disease.  Therefore, the patient requires a tunneled dialysis catheter placement.  The patient is informed of  the risks catheter placement include but are not limited to: bleeding, infection, central venous injury, pneumothorax, possible venous stenosis, possible malpositioning in the venous system, and possible infections related to long-term catheter presence.  The patient was aware of these risks and agreed to proceed.  DESCRIPTION: The patient was taken back to Special Procedure suite.  Prior to sedation, the patient was given IV antibiotics.  After obtaining adequate sedation, the patient was prepped and draped in the standard fashion for a left IJ tunneled dialysis catheter placement.  Appropriate Time Out is called.     The left neck and chest wall are then infiltrated with 1% Lidocaine with epinepherine.  A 23 cm tip to cuff catheter is then selected, opened on the back table and prepped. Ultrasound is placed in a sterile sleeve.  Under ultrasound guidance, the left IJ vein is examined and is noted to be  echolucent and easily compressible indicating patency.   An image is recorded for the permanent record.  The left IJ vein is cannulated with the microneedle under direct ultrasound vissualization.  A Microwire followed by a micro sheath is then inserted without difficulty.   A J-wire was then advanced under fluoroscopic guidance into the inferior vena cava and the wire was secured.  Small counter incision was then made at the wire insertion site. A small pocket was fashioned with blunt dissection to allow easier passage of the cuff.  The dilator and peel-away sheath are then advanced over the wire under fluoroscopic guidance. The catheters and advanced through the peel-away sheath. It is approximated to the left chest wall after verifying the tips at the atrial caval junction and an exit site is selected.  Small incision is made at the selected exit site and the tunneling device was passed subcutaneously to the counter incision. Catheter is then pulled through the subcutaneous tunnel. The catheter is then verified for tip position under fluoroscopy, transected and the hub assembly connected.  The catheter slide back a bit and the tips were in the innominate vien and SVC respectively.  Therefore two stiff angled glide catheters were introduced and the catheter was advanced so the tips were in the right atrium.    Each port was tested by aspirating and flushing.  No resistance was noted.  Each port was then thoroughly flushed with heparinized saline.  The catheter was secured in placed with two interrupted stitches of 0 silk tied to the catheter.  The counter incision was closed with a U-stitch of 4-0 Monocryl.  The insertion site is then cleaned and  sterile bandages applied including a Biopatch.  Each port was then packed with concentrated heparin (1000 Units/mL) at the manufacturer recommended volumes to each port.  Sterile caps were applied to each port.  On completion fluoroscopy, the tips of the catheter  were in the right atrium, and there was no evidence of pneumothorax.  COMPLICATIONS: None  CONDITION: Unchanged   Levora Dredge [facility] Office: [phone]   03/17/2023, 11:25 AM

## 2023-03-17 NOTE — Progress Notes (Signed)
Right femoral temporary HDC removed per protocol per MD order. Manual pressure applied for 30 mins. Vaseline gauze, gauze, and Tegaderm applied over insertion site. No bleeding or swelling noted. Instructed patient to remain in bed for thirty mins. Educated patient and patient's father about S/S of infection and when to call MD; keep dressing dry and intact for 24 hours. Pt and patient's father verbalized comprehension.

## 2023-03-17 NOTE — Care Management Important Message (Signed)
Important Message  Patient Details  Name: Joseph Gilmore MRN: [id number] Date of Birth: 1970/12/01   Medicare Important Message Given:  N/A - LOS <3 / Initial given by admissions     [name] [name] 03/17/2023, 10:42 AM

## 2023-03-18 DIAGNOSIS — N2581 Secondary hyperparathyroidism of renal origin: Secondary | ICD-10-CM | POA: Diagnosis not present

## 2023-03-18 DIAGNOSIS — Z992 Dependence on renal dialysis: Secondary | ICD-10-CM | POA: Diagnosis not present

## 2023-03-18 DIAGNOSIS — N186 End stage renal disease: Secondary | ICD-10-CM | POA: Diagnosis not present

## 2023-03-20 ENCOUNTER — Encounter: Payer: Self-pay | Admitting: Vascular Surgery

## 2023-03-21 DIAGNOSIS — N186 End stage renal disease: Secondary | ICD-10-CM | POA: Diagnosis not present

## 2023-03-21 DIAGNOSIS — N2581 Secondary hyperparathyroidism of renal origin: Secondary | ICD-10-CM | POA: Diagnosis not present

## 2023-03-21 DIAGNOSIS — Z992 Dependence on renal dialysis: Secondary | ICD-10-CM | POA: Diagnosis not present

## 2023-03-23 DIAGNOSIS — Z992 Dependence on renal dialysis: Secondary | ICD-10-CM | POA: Diagnosis not present

## 2023-03-23 DIAGNOSIS — N186 End stage renal disease: Secondary | ICD-10-CM | POA: Diagnosis not present

## 2023-03-23 DIAGNOSIS — N2581 Secondary hyperparathyroidism of renal origin: Secondary | ICD-10-CM | POA: Diagnosis not present

## 2023-03-25 DIAGNOSIS — N186 End stage renal disease: Secondary | ICD-10-CM | POA: Diagnosis not present

## 2023-03-25 DIAGNOSIS — Z992 Dependence on renal dialysis: Secondary | ICD-10-CM | POA: Diagnosis not present

## 2023-03-25 DIAGNOSIS — N2581 Secondary hyperparathyroidism of renal origin: Secondary | ICD-10-CM | POA: Diagnosis not present

## 2023-03-28 DIAGNOSIS — Z992 Dependence on renal dialysis: Secondary | ICD-10-CM | POA: Diagnosis not present

## 2023-03-28 DIAGNOSIS — N2581 Secondary hyperparathyroidism of renal origin: Secondary | ICD-10-CM | POA: Diagnosis not present

## 2023-03-28 DIAGNOSIS — N186 End stage renal disease: Secondary | ICD-10-CM | POA: Diagnosis not present

## 2023-03-30 DIAGNOSIS — Z992 Dependence on renal dialysis: Secondary | ICD-10-CM | POA: Diagnosis not present

## 2023-03-30 DIAGNOSIS — N2581 Secondary hyperparathyroidism of renal origin: Secondary | ICD-10-CM | POA: Diagnosis not present

## 2023-03-30 DIAGNOSIS — N186 End stage renal disease: Secondary | ICD-10-CM | POA: Diagnosis not present

## 2023-04-01 DIAGNOSIS — N186 End stage renal disease: Secondary | ICD-10-CM | POA: Diagnosis not present

## 2023-04-01 DIAGNOSIS — Z992 Dependence on renal dialysis: Secondary | ICD-10-CM | POA: Diagnosis not present

## 2023-04-01 DIAGNOSIS — N2581 Secondary hyperparathyroidism of renal origin: Secondary | ICD-10-CM | POA: Diagnosis not present

## 2023-04-04 DIAGNOSIS — N186 End stage renal disease: Secondary | ICD-10-CM | POA: Diagnosis not present

## 2023-04-04 DIAGNOSIS — N2581 Secondary hyperparathyroidism of renal origin: Secondary | ICD-10-CM | POA: Diagnosis not present

## 2023-04-04 DIAGNOSIS — Z992 Dependence on renal dialysis: Secondary | ICD-10-CM | POA: Diagnosis not present

## 2023-04-06 DIAGNOSIS — Z992 Dependence on renal dialysis: Secondary | ICD-10-CM | POA: Diagnosis not present

## 2023-04-06 DIAGNOSIS — N2581 Secondary hyperparathyroidism of renal origin: Secondary | ICD-10-CM | POA: Diagnosis not present

## 2023-04-06 DIAGNOSIS — N186 End stage renal disease: Secondary | ICD-10-CM | POA: Diagnosis not present

## 2023-04-08 DIAGNOSIS — N2581 Secondary hyperparathyroidism of renal origin: Secondary | ICD-10-CM | POA: Diagnosis not present

## 2023-04-08 DIAGNOSIS — N186 End stage renal disease: Secondary | ICD-10-CM | POA: Diagnosis not present

## 2023-04-08 DIAGNOSIS — Z992 Dependence on renal dialysis: Secondary | ICD-10-CM | POA: Diagnosis not present

## 2023-04-10 ENCOUNTER — Ambulatory Visit (INDEPENDENT_AMBULATORY_CARE_PROVIDER_SITE_OTHER): Payer: Medicare HMO | Admitting: Internal Medicine

## 2023-04-10 VITALS — BP 120/80 | HR 92 | Ht 68.0 in | Wt 265.4 lb

## 2023-04-10 DIAGNOSIS — N186 End stage renal disease: Secondary | ICD-10-CM

## 2023-04-10 DIAGNOSIS — Z1331 Encounter for screening for depression: Secondary | ICD-10-CM

## 2023-04-10 DIAGNOSIS — Z0001 Encounter for general adult medical examination with abnormal findings: Secondary | ICD-10-CM | POA: Diagnosis not present

## 2023-04-10 DIAGNOSIS — Z6832 Body mass index (BMI) 32.0-32.9, adult: Secondary | ICD-10-CM | POA: Diagnosis not present

## 2023-04-10 DIAGNOSIS — E6609 Other obesity due to excess calories: Secondary | ICD-10-CM

## 2023-04-10 DIAGNOSIS — I10 Essential (primary) hypertension: Secondary | ICD-10-CM | POA: Diagnosis not present

## 2023-04-10 NOTE — Progress Notes (Signed)
Established Patient Office Visit  Subjective:  Patient ID: Joseph Gilmore, male    DOB: November 04, 1970  Age: 52 y.o. MRN: [id number]  Chief Complaint  Patient presents with   Annual Exam    CPE WITH LAB RESULTS    No new complaints, here for CPE. Was recently hospitalized for AV fistula malfunction and emergent dialysis.   No other concerns at this time.   Past Medical History:  Diagnosis Date   Autism    Chronic kidney disease    Hypertension    Murmur    OCD (obsessive compulsive disorder)     Past Surgical History:  Procedure Laterality Date   A/V FISTULAGRAM Left 10/07/2020   Procedure: A/V FISTULAGRAM;  Surgeon: Renford Dills, MD;  Location: [facility];  Service: Cardiovascular;  Laterality: Left;   A/V FISTULAGRAM Left 02/15/2023   Procedure: A/V Fistulagram;  Surgeon: Renford Dills, MD;  Location: [facility];  Service: Cardiovascular;  Laterality: Left;   AV FISTULA PLACEMENT Left 07/15/2020   Procedure: ARTERIOVENOUS FISTULA CREATION;  Surgeon: Renford Dills, MD;  Location: [facility];  Service: Vascular;  Laterality: Left;   DIALYSIS/PERMA CATHETER INSERTION N/A 04/06/2020   Procedure: DIALYSIS/PERMA CATHETER INSERTION;  Surgeon: Annice Needy, MD;  Location: [facility];  Service: Cardiovascular;  Laterality: N/A;   DIALYSIS/PERMA CATHETER INSERTION N/A 05/26/2020   Procedure: DIALYSIS/PERMA CATHETER INSERTION;  Surgeon: Renford Dills, MD;  Location: [facility];  Service: Cardiovascular;  Laterality: N/A;   DIALYSIS/PERMA CATHETER INSERTION N/A 07/09/2020   Procedure: DIALYSIS/PERMA CATHETER INSERTION;  Surgeon: Annice Needy, MD;  Location: [facility];  Service: Cardiovascular;  Laterality: N/A;   DIALYSIS/PERMA CATHETER INSERTION N/A 01/08/2021   Procedure: DIALYSIS/PERMA CATHETER INSERTION;  Surgeon: Annice Needy, MD;  Location: [facility];  Service: Cardiovascular;  Laterality: N/A;    DIALYSIS/PERMA CATHETER INSERTION N/A 03/17/2023   Procedure: DIALYSIS/PERMA CATHETER INSERTION;  Surgeon: Renford Dills, MD;  Location: [facility];  Service: Cardiovascular;  Laterality: N/A;   DIALYSIS/PERMA CATHETER REMOVAL N/A 03/15/2021   Procedure: DIALYSIS/PERMA CATHETER REMOVAL;  Surgeon: Annice Needy, MD;  Location: [facility];  Service: Cardiovascular;  Laterality: N/A;   REVISON OF ARTERIOVENOUS FISTULA Left 12/11/2020   Procedure: ARTERIOVENOUS (AV) FISTULA CREATION ( BRACHIAL CEPHALIC );  Surgeon: Renford Dills, MD;  Location: [facility];  Service: Vascular;  Laterality: Left;   TEMPORARY DIALYSIS CATHETER N/A 03/15/2023   Procedure: TEMPORARY DIALYSIS CATHETER;  Surgeon: Renford Dills, MD;  Location: [facility];  Service: Cardiovascular;  Laterality: N/A;    Social History   Socioeconomic History   Marital status: Single    Spouse name: Not on file   Number of children: Not on file   Years of education: Not on file   Highest education level: Not on file  Occupational History   Not on file  Tobacco Use   Smoking status: Never    Passive exposure: Never   Smokeless tobacco: Never  Vaping Use   Vaping status: Never Used  Substance and Sexual Activity   Alcohol use: Never   Drug use: Never   Sexual activity: Not Currently  Other Topics Concern   Not on file  Social History Narrative   Lives with father .   Social Determinants of Health   Financial Resource Strain: Not on file  Food Insecurity: No Food Insecurity (05/10/2022)   Hunger  Vital Sign    Worried About Programme researcher, broadcasting/film/video in the Last Year: Never true    Ran Out of Food in the Last Year: Never true  Transportation Needs: No Transportation Needs (05/10/2022)   PRAPARE - Administrator, Civil Service (Medical): No    Lack of Transportation (Non-Medical): No  Physical Activity: Not on file  Stress: Not on file  Social Connections: Not on file  Intimate  Partner Violence: Not on file    No family history on file.  Allergies  Allergen Reactions   Chlorhexidine Other (See Comments)    Blisters (topical)    Review of Systems  Constitutional: Negative.   HENT: Negative.    Eyes: Negative.   Respiratory: Negative.    Cardiovascular: Negative.   Gastrointestinal: Negative.   Genitourinary: Negative.   Skin: Negative.   Neurological: Negative.   Endo/Heme/Allergies: Negative.        Objective:   BP 120/80   Pulse 92   Ht 5\' 8"  (1.727 m)   Wt 265 lb 6.4 oz (120.4 kg)   SpO2 97%   BMI 40.35 kg/m   Vitals:   04/10/23 1357  BP: 120/80  Pulse: 92  Height: 5\' 8"  (1.727 m)  Weight: 265 lb 6.4 oz (120.4 kg)  SpO2: 97%  BMI (Calculated): 40.36    Physical Exam Vitals reviewed.  Constitutional:      Appearance: Normal appearance. He is obese.  HENT:     Head: Normocephalic.     Left Ear: There is no impacted cerumen.     Nose: Nose normal.     Mouth/Throat:     Mouth: Mucous membranes are moist.     Pharynx: No posterior oropharyngeal erythema.  Eyes:     Extraocular Movements: Extraocular movements intact.     Pupils: Pupils are equal, round, and reactive to light.  Cardiovascular:     Rate and Rhythm: Regular rhythm.     Chest Wall: PMI is not displaced.     Pulses: Normal pulses.     Heart sounds: Normal heart sounds. No murmur heard. Pulmonary:     Effort: Pulmonary effort is normal.     Breath sounds: Normal air entry. No rhonchi or rales.  Chest:     Comments: Left HD catheter site fine. Abdominal:     General: Abdomen is flat. Bowel sounds are normal. There is no distension.     Palpations: Abdomen is soft. There is no hepatomegaly, splenomegaly or mass.     Tenderness: There is no abdominal tenderness.  Musculoskeletal:        General: Normal range of motion.     Cervical back: Normal range of motion and neck supple.     Right lower leg: No edema.     Left lower leg: No edema.  Skin:    General:  Skin is warm and dry.  Neurological:     General: No focal deficit present.     Mental Status: He is alert and oriented to person, place, and time.     Cranial Nerves: No cranial nerve deficit.     Motor: No weakness.  Psychiatric:        Mood and Affect: Mood normal.        Behavior: Behavior normal.      No results found for any visits on 04/10/23.      Assessment & Plan:  As per problem list. Problem List Items Addressed This Visit  Cardiovascular and Mediastinum   Essential hypertension - Primary   Relevant Medications   midodrine (PROAMATINE) 2.5 MG tablet     Genitourinary   End stage renal disease (HCC)   Other Visit Diagnoses     Class 1 obesity due to excess calories with serious comorbidity and body mass index (BMI) of 32.0 to 32.9 in adult           Return in about 3 months (around 07/11/2023) for BP followup.   Total time spent: 40 minutes  Luna Fuse, MD  04/10/2023   This document may have been prepared by [facility] Voice Recognition software and as such may include unintentional dictation errors.

## 2023-04-11 DIAGNOSIS — N2581 Secondary hyperparathyroidism of renal origin: Secondary | ICD-10-CM | POA: Diagnosis not present

## 2023-04-11 DIAGNOSIS — N186 End stage renal disease: Secondary | ICD-10-CM | POA: Diagnosis not present

## 2023-04-11 DIAGNOSIS — Z992 Dependence on renal dialysis: Secondary | ICD-10-CM | POA: Diagnosis not present

## 2023-04-13 DIAGNOSIS — Z992 Dependence on renal dialysis: Secondary | ICD-10-CM | POA: Diagnosis not present

## 2023-04-13 DIAGNOSIS — N186 End stage renal disease: Secondary | ICD-10-CM | POA: Diagnosis not present

## 2023-04-13 DIAGNOSIS — N2581 Secondary hyperparathyroidism of renal origin: Secondary | ICD-10-CM | POA: Diagnosis not present

## 2023-04-15 DIAGNOSIS — N186 End stage renal disease: Secondary | ICD-10-CM | POA: Diagnosis not present

## 2023-04-15 DIAGNOSIS — N2581 Secondary hyperparathyroidism of renal origin: Secondary | ICD-10-CM | POA: Diagnosis not present

## 2023-04-15 DIAGNOSIS — Z992 Dependence on renal dialysis: Secondary | ICD-10-CM | POA: Diagnosis not present

## 2023-04-18 DIAGNOSIS — N186 End stage renal disease: Secondary | ICD-10-CM | POA: Diagnosis not present

## 2023-04-18 DIAGNOSIS — Z992 Dependence on renal dialysis: Secondary | ICD-10-CM | POA: Diagnosis not present

## 2023-04-18 DIAGNOSIS — N2581 Secondary hyperparathyroidism of renal origin: Secondary | ICD-10-CM | POA: Diagnosis not present

## 2023-04-20 DIAGNOSIS — N2581 Secondary hyperparathyroidism of renal origin: Secondary | ICD-10-CM | POA: Diagnosis not present

## 2023-04-20 DIAGNOSIS — Z992 Dependence on renal dialysis: Secondary | ICD-10-CM | POA: Diagnosis not present

## 2023-04-20 DIAGNOSIS — N186 End stage renal disease: Secondary | ICD-10-CM | POA: Diagnosis not present

## 2023-04-22 DIAGNOSIS — N2581 Secondary hyperparathyroidism of renal origin: Secondary | ICD-10-CM | POA: Diagnosis not present

## 2023-04-22 DIAGNOSIS — Z992 Dependence on renal dialysis: Secondary | ICD-10-CM | POA: Diagnosis not present

## 2023-04-22 DIAGNOSIS — N186 End stage renal disease: Secondary | ICD-10-CM | POA: Diagnosis not present

## 2023-04-25 DIAGNOSIS — N2581 Secondary hyperparathyroidism of renal origin: Secondary | ICD-10-CM | POA: Diagnosis not present

## 2023-04-25 DIAGNOSIS — Z992 Dependence on renal dialysis: Secondary | ICD-10-CM | POA: Diagnosis not present

## 2023-04-25 DIAGNOSIS — N186 End stage renal disease: Secondary | ICD-10-CM | POA: Diagnosis not present

## 2023-04-26 ENCOUNTER — Encounter (INDEPENDENT_AMBULATORY_CARE_PROVIDER_SITE_OTHER): Payer: Self-pay | Admitting: Nurse Practitioner

## 2023-04-26 ENCOUNTER — Ambulatory Visit (INDEPENDENT_AMBULATORY_CARE_PROVIDER_SITE_OTHER): Payer: Medicare HMO

## 2023-04-26 ENCOUNTER — Ambulatory Visit (INDEPENDENT_AMBULATORY_CARE_PROVIDER_SITE_OTHER): Payer: Medicare HMO | Admitting: Nurse Practitioner

## 2023-04-26 VITALS — BP 88/62 | HR 96 | Resp 16 | Wt 263.0 lb

## 2023-04-26 DIAGNOSIS — I10 Essential (primary) hypertension: Secondary | ICD-10-CM | POA: Diagnosis not present

## 2023-04-26 DIAGNOSIS — N186 End stage renal disease: Secondary | ICD-10-CM

## 2023-04-27 ENCOUNTER — Encounter (INDEPENDENT_AMBULATORY_CARE_PROVIDER_SITE_OTHER): Payer: Self-pay | Admitting: Nurse Practitioner

## 2023-04-27 DIAGNOSIS — N2581 Secondary hyperparathyroidism of renal origin: Secondary | ICD-10-CM | POA: Diagnosis not present

## 2023-04-27 DIAGNOSIS — N186 End stage renal disease: Secondary | ICD-10-CM | POA: Diagnosis not present

## 2023-04-27 DIAGNOSIS — Z992 Dependence on renal dialysis: Secondary | ICD-10-CM | POA: Diagnosis not present

## 2023-04-27 NOTE — Progress Notes (Signed)
Subjective:    Patient ID: Joseph Gilmore, male    DOB: 12/19/1970, 52 y.o.   MRN: [id number] Chief Complaint  Patient presents with   Follow-up    [facility] 3 week with HDA    All the patient is a 52 year old male who returns today for HD.  His last intervention was on June 19 , 2024 and underwent angioplasty of the peripheral segment of his left brachiocephalic AV fistula.  1 about a month later he had a PermCath placed.  He has not had any intervention since that time.  Today the HD confirms that his fistula is occluded.  The patient himself in addition to his father were poor historians based upon the ultrasound today and records I believe that this has been occluded for at least a month.  The patient also notes that he has been having issues with controlling his blood pressure and does need to take midodrine due to hypotensive episodes.      Review of Systems  Unable to perform ROS: Psychiatric disorder  All other systems reviewed and are negative.      Objective:   Physical Exam Vitals reviewed.  HENT:     Head: Normocephalic.  Cardiovascular:     Rate and Rhythm: Normal rate.     Pulses: Normal pulses.  Pulmonary:     Effort: Pulmonary effort is normal.  Skin:    General: Skin is warm and dry.  Neurological:     Mental Status: He is alert and oriented to person, place, and time.  Psychiatric:        Attention and Perception: He is inattentive.        Speech: He is noncommunicative.     BP (!) 88/62 (BP Location: Right Arm)   Pulse 96   Resp 16   Wt 263 lb (119.3 kg)   BMI 39.99 kg/m   Past Medical History:  Diagnosis Date   Autism    Chronic kidney disease    Hypertension    Murmur    OCD (obsessive compulsive disorder)     Social History   Socioeconomic History   Marital status: Single    Spouse name: Not on file   Number of children: Not on file   Years of education: Not on file   Highest education level: Not on file  Occupational History    Not on file  Tobacco Use   Smoking status: Never    Passive exposure: Never   Smokeless tobacco: Never  Vaping Use   Vaping status: Never Used  Substance and Sexual Activity   Alcohol use: Never   Drug use: Never   Sexual activity: Not Currently  Other Topics Concern   Not on file  Social History Narrative   Lives with father .   Social Determinants of Health   Financial Resource Strain: Not on file  Food Insecurity: No Food Insecurity (05/10/2022)   Hunger Vital Sign    Worried About Running Out of Food in the Last Year: Never true    Ran Out of Food in the Last Year: Never true  Transportation Needs: No Transportation Needs (05/10/2022)   PRAPARE - Administrator, Civil Service (Medical): No    Lack of Transportation (Non-Medical): No  Physical Activity: Not on file  Stress: Not on file  Social Connections: Not on file  Intimate Partner Violence: Not on file    Past Surgical History:  Procedure Laterality Date   A/V FISTULAGRAM  Left 10/07/2020   Procedure: A/V FISTULAGRAM;  Surgeon: Renford Dills, MD;  Location: [facility] INVASIVE CV LAB;  Service: Cardiovascular;  Laterality: Left;   A/V FISTULAGRAM Left 02/15/2023   Procedure: A/V Fistulagram;  Surgeon: Renford Dills, MD;  Location: [facility] INVASIVE CV LAB;  Service: Cardiovascular;  Laterality: Left;   AV FISTULA PLACEMENT Left 07/15/2020   Procedure: ARTERIOVENOUS FISTULA CREATION;  Surgeon: Renford Dills, MD;  Location: [facility] ORS;  Service: Vascular;  Laterality: Left;   DIALYSIS/PERMA CATHETER INSERTION N/A 04/06/2020   Procedure: DIALYSIS/PERMA CATHETER INSERTION;  Surgeon: Annice Needy, MD;  Location: [facility] INVASIVE CV LAB;  Service: Cardiovascular;  Laterality: N/A;   DIALYSIS/PERMA CATHETER INSERTION N/A 05/26/2020   Procedure: DIALYSIS/PERMA CATHETER INSERTION;  Surgeon: Renford Dills, MD;  Location: [facility] INVASIVE CV LAB;  Service: Cardiovascular;  Laterality: N/A;   DIALYSIS/PERMA CATHETER  INSERTION N/A 07/09/2020   Procedure: DIALYSIS/PERMA CATHETER INSERTION;  Surgeon: Annice Needy, MD;  Location: [facility] INVASIVE CV LAB;  Service: Cardiovascular;  Laterality: N/A;   DIALYSIS/PERMA CATHETER INSERTION N/A 01/08/2021   Procedure: DIALYSIS/PERMA CATHETER INSERTION;  Surgeon: Annice Needy, MD;  Location: [facility] INVASIVE CV LAB;  Service: Cardiovascular;  Laterality: N/A;   DIALYSIS/PERMA CATHETER INSERTION N/A 03/17/2023   Procedure: DIALYSIS/PERMA CATHETER INSERTION;  Surgeon: Renford Dills, MD;  Location: [facility] INVASIVE CV LAB;  Service: Cardiovascular;  Laterality: N/A;   DIALYSIS/PERMA CATHETER REMOVAL N/A 03/15/2021   Procedure: DIALYSIS/PERMA CATHETER REMOVAL;  Surgeon: Annice Needy, MD;  Location: [facility] INVASIVE CV LAB;  Service: Cardiovascular;  Laterality: N/A;   REVISON OF ARTERIOVENOUS FISTULA Left 12/11/2020   Procedure: ARTERIOVENOUS (AV) FISTULA CREATION ( BRACHIAL CEPHALIC );  Surgeon: Renford Dills, MD;  Location: [facility] ORS;  Service: Vascular;  Laterality: Left;   TEMPORARY DIALYSIS CATHETER N/A 03/15/2023   Procedure: TEMPORARY DIALYSIS CATHETER;  Surgeon: Renford Dills, MD;  Location: [facility] INVASIVE CV LAB;  Service: Cardiovascular;  Laterality: N/A;    History reviewed. No pertinent family history.  Allergies  Allergen Reactions   Chlorhexidine Other (See Comments)    Blisters (topical)       Latest Ref Rng & Units 03/16/2023    3:07 AM 03/15/2023    3:10 PM 12/11/2020    9:20 AM  CBC  WBC 4.0 - 10.5 K/uL 9.0  8.6    Hemoglobin 13.0 - 17.0 g/dL 9.8  9.0  14.7   Hematocrit 39.0 - 52.0 % 29.6  27.5  33.0   Platelets 150 - 400 K/uL 185  185        CMP     Component Value Date/Time   NA 135 03/17/2023 1201   K 4.1 03/17/2023 1201   CL 94 (L) 03/17/2023 1201   CO2 25 03/17/2023 1201   GLUCOSE 156 (H) 03/17/2023 1201   BUN 36 (H) 03/17/2023 1201   CREATININE 12.25 (H) 03/17/2023 1201   CALCIUM 7.3 (L) 03/17/2023 1201   PROT 6.5 03/16/2023 0307    ALBUMIN 3.2 (L) 03/16/2023 0307   AST 15 03/16/2023 0307   ALT 13 03/16/2023 0307   ALKPHOS 50 03/16/2023 0307   BILITOT 0.6 03/16/2023 0307   GFRNONAA 5 (L) 03/17/2023 1201     No results found.     Assessment & Plan:   1. ESRD (end stage renal disease) (HCC) Based on discussion with the patient and his father I suspect that the access itself has not been utilized in about the last month or so.  Given  that he has a fistula there will be no salvaging this access.  At this point the only option is for new dialysis access.  His last vein mapping was in 2021 so we will have him return for vein mapping.  In the interim the patient will need to have his blood pressure improved.  I suspect that the reason he clotted off in the first place was due to hypotension.  If he continues to have hypotensive episodes, the new access will also likely have significant issues.  Will attempt to reach out to the patient's nephrologist for some assistance in this matter.  2. Essential (primary) hypertension Continue antihypertensive medications as already ordered, these medications have been reviewed and there are no changes at this time.   Current Outpatient Medications on File Prior to Visit  Medication Sig Dispense Refill   Cholecalciferol (VITAMIN D-3) 125 MCG (5000 UT) TABS Take 5,000 Units by mouth daily.     dutasteride (AVODART) 0.5 MG capsule Take 1 capsule (0.5 mg total) by mouth daily. 90 capsule 3   midodrine (PROAMATINE) 2.5 MG tablet Take 2.5 mg by mouth 3 (three) times daily with meals.     multivitamin (RENA-VIT) TABS tablet Take by mouth.     tamsulosin (FLOMAX) 0.4 MG CAPS capsule Take 1 capsule (0.4 mg total) by mouth daily. 90 capsule 3   calcium acetate (PHOSLO) 667 MG capsule  (Patient not taking: Reported on 01/09/2023)     No current facility-administered medications on file prior to visit.    There are no Patient Instructions on file for this visit. No follow-ups on  file.   Georgiana Spinner, NP

## 2023-04-29 DIAGNOSIS — Z992 Dependence on renal dialysis: Secondary | ICD-10-CM | POA: Diagnosis not present

## 2023-04-29 DIAGNOSIS — N186 End stage renal disease: Secondary | ICD-10-CM | POA: Diagnosis not present

## 2023-04-29 DIAGNOSIS — N2581 Secondary hyperparathyroidism of renal origin: Secondary | ICD-10-CM | POA: Diagnosis not present

## 2023-05-02 DIAGNOSIS — Z992 Dependence on renal dialysis: Secondary | ICD-10-CM | POA: Diagnosis not present

## 2023-05-02 DIAGNOSIS — N186 End stage renal disease: Secondary | ICD-10-CM | POA: Diagnosis not present

## 2023-05-02 DIAGNOSIS — N2581 Secondary hyperparathyroidism of renal origin: Secondary | ICD-10-CM | POA: Diagnosis not present

## 2023-05-04 DIAGNOSIS — N186 End stage renal disease: Secondary | ICD-10-CM | POA: Diagnosis not present

## 2023-05-04 DIAGNOSIS — N2581 Secondary hyperparathyroidism of renal origin: Secondary | ICD-10-CM | POA: Diagnosis not present

## 2023-05-04 DIAGNOSIS — Z992 Dependence on renal dialysis: Secondary | ICD-10-CM | POA: Diagnosis not present

## 2023-05-06 DIAGNOSIS — Z992 Dependence on renal dialysis: Secondary | ICD-10-CM | POA: Diagnosis not present

## 2023-05-06 DIAGNOSIS — N186 End stage renal disease: Secondary | ICD-10-CM | POA: Diagnosis not present

## 2023-05-06 DIAGNOSIS — N2581 Secondary hyperparathyroidism of renal origin: Secondary | ICD-10-CM | POA: Diagnosis not present

## 2023-05-09 DIAGNOSIS — Z992 Dependence on renal dialysis: Secondary | ICD-10-CM | POA: Diagnosis not present

## 2023-05-09 DIAGNOSIS — N2581 Secondary hyperparathyroidism of renal origin: Secondary | ICD-10-CM | POA: Diagnosis not present

## 2023-05-09 DIAGNOSIS — N186 End stage renal disease: Secondary | ICD-10-CM | POA: Diagnosis not present

## 2023-05-11 DIAGNOSIS — Z992 Dependence on renal dialysis: Secondary | ICD-10-CM | POA: Diagnosis not present

## 2023-05-11 DIAGNOSIS — N186 End stage renal disease: Secondary | ICD-10-CM | POA: Diagnosis not present

## 2023-05-11 DIAGNOSIS — N2581 Secondary hyperparathyroidism of renal origin: Secondary | ICD-10-CM | POA: Diagnosis not present

## 2023-05-13 DIAGNOSIS — N2581 Secondary hyperparathyroidism of renal origin: Secondary | ICD-10-CM | POA: Diagnosis not present

## 2023-05-13 DIAGNOSIS — N186 End stage renal disease: Secondary | ICD-10-CM | POA: Diagnosis not present

## 2023-05-13 DIAGNOSIS — Z992 Dependence on renal dialysis: Secondary | ICD-10-CM | POA: Diagnosis not present

## 2023-05-16 DIAGNOSIS — N186 End stage renal disease: Secondary | ICD-10-CM | POA: Diagnosis not present

## 2023-05-16 DIAGNOSIS — Z992 Dependence on renal dialysis: Secondary | ICD-10-CM | POA: Diagnosis not present

## 2023-05-16 DIAGNOSIS — N2581 Secondary hyperparathyroidism of renal origin: Secondary | ICD-10-CM | POA: Diagnosis not present

## 2023-05-18 DIAGNOSIS — N186 End stage renal disease: Secondary | ICD-10-CM | POA: Diagnosis not present

## 2023-05-18 DIAGNOSIS — N2581 Secondary hyperparathyroidism of renal origin: Secondary | ICD-10-CM | POA: Diagnosis not present

## 2023-05-18 DIAGNOSIS — Z992 Dependence on renal dialysis: Secondary | ICD-10-CM | POA: Diagnosis not present

## 2023-05-20 DIAGNOSIS — Z992 Dependence on renal dialysis: Secondary | ICD-10-CM | POA: Diagnosis not present

## 2023-05-20 DIAGNOSIS — N186 End stage renal disease: Secondary | ICD-10-CM | POA: Diagnosis not present

## 2023-05-20 DIAGNOSIS — N2581 Secondary hyperparathyroidism of renal origin: Secondary | ICD-10-CM | POA: Diagnosis not present

## 2023-05-23 DIAGNOSIS — Z992 Dependence on renal dialysis: Secondary | ICD-10-CM | POA: Diagnosis not present

## 2023-05-23 DIAGNOSIS — N186 End stage renal disease: Secondary | ICD-10-CM | POA: Diagnosis not present

## 2023-05-23 DIAGNOSIS — N2581 Secondary hyperparathyroidism of renal origin: Secondary | ICD-10-CM | POA: Diagnosis not present

## 2023-05-25 DIAGNOSIS — N2581 Secondary hyperparathyroidism of renal origin: Secondary | ICD-10-CM | POA: Diagnosis not present

## 2023-05-25 DIAGNOSIS — Z992 Dependence on renal dialysis: Secondary | ICD-10-CM | POA: Diagnosis not present

## 2023-05-25 DIAGNOSIS — N186 End stage renal disease: Secondary | ICD-10-CM | POA: Diagnosis not present

## 2023-05-27 DIAGNOSIS — N2581 Secondary hyperparathyroidism of renal origin: Secondary | ICD-10-CM | POA: Diagnosis not present

## 2023-05-27 DIAGNOSIS — N186 End stage renal disease: Secondary | ICD-10-CM | POA: Diagnosis not present

## 2023-05-27 DIAGNOSIS — Z992 Dependence on renal dialysis: Secondary | ICD-10-CM | POA: Diagnosis not present

## 2023-05-29 DIAGNOSIS — Z992 Dependence on renal dialysis: Secondary | ICD-10-CM | POA: Diagnosis not present

## 2023-05-29 DIAGNOSIS — N186 End stage renal disease: Secondary | ICD-10-CM | POA: Diagnosis not present

## 2023-05-30 DIAGNOSIS — E8770 Fluid overload, unspecified: Secondary | ICD-10-CM | POA: Diagnosis not present

## 2023-05-30 DIAGNOSIS — N2581 Secondary hyperparathyroidism of renal origin: Secondary | ICD-10-CM | POA: Diagnosis not present

## 2023-05-30 DIAGNOSIS — N186 End stage renal disease: Secondary | ICD-10-CM | POA: Diagnosis not present

## 2023-05-30 DIAGNOSIS — Z992 Dependence on renal dialysis: Secondary | ICD-10-CM | POA: Diagnosis not present

## 2023-06-01 DIAGNOSIS — Z992 Dependence on renal dialysis: Secondary | ICD-10-CM | POA: Diagnosis not present

## 2023-06-01 DIAGNOSIS — E8770 Fluid overload, unspecified: Secondary | ICD-10-CM | POA: Diagnosis not present

## 2023-06-01 DIAGNOSIS — N186 End stage renal disease: Secondary | ICD-10-CM | POA: Diagnosis not present

## 2023-06-01 DIAGNOSIS — N2581 Secondary hyperparathyroidism of renal origin: Secondary | ICD-10-CM | POA: Diagnosis not present

## 2023-06-03 DIAGNOSIS — E8770 Fluid overload, unspecified: Secondary | ICD-10-CM | POA: Diagnosis not present

## 2023-06-03 DIAGNOSIS — N186 End stage renal disease: Secondary | ICD-10-CM | POA: Diagnosis not present

## 2023-06-03 DIAGNOSIS — N2581 Secondary hyperparathyroidism of renal origin: Secondary | ICD-10-CM | POA: Diagnosis not present

## 2023-06-03 DIAGNOSIS — Z992 Dependence on renal dialysis: Secondary | ICD-10-CM | POA: Diagnosis not present

## 2023-06-06 DIAGNOSIS — N186 End stage renal disease: Secondary | ICD-10-CM | POA: Diagnosis not present

## 2023-06-06 DIAGNOSIS — I120 Hypertensive chronic kidney disease with stage 5 chronic kidney disease or end stage renal disease: Secondary | ICD-10-CM | POA: Diagnosis not present

## 2023-06-06 DIAGNOSIS — Z4901 Encounter for fitting and adjustment of extracorporeal dialysis catheter: Secondary | ICD-10-CM | POA: Diagnosis not present

## 2023-06-06 DIAGNOSIS — T82828A Fibrosis of vascular prosthetic devices, implants and grafts, initial encounter: Secondary | ICD-10-CM | POA: Diagnosis not present

## 2023-06-06 DIAGNOSIS — T8241XA Breakdown (mechanical) of vascular dialysis catheter, initial encounter: Secondary | ICD-10-CM | POA: Diagnosis not present

## 2023-06-06 DIAGNOSIS — D631 Anemia in chronic kidney disease: Secondary | ICD-10-CM | POA: Diagnosis not present

## 2023-06-08 DIAGNOSIS — N2581 Secondary hyperparathyroidism of renal origin: Secondary | ICD-10-CM | POA: Diagnosis not present

## 2023-06-08 DIAGNOSIS — E8770 Fluid overload, unspecified: Secondary | ICD-10-CM | POA: Diagnosis not present

## 2023-06-08 DIAGNOSIS — Z992 Dependence on renal dialysis: Secondary | ICD-10-CM | POA: Diagnosis not present

## 2023-06-08 DIAGNOSIS — N186 End stage renal disease: Secondary | ICD-10-CM | POA: Diagnosis not present

## 2023-06-09 DIAGNOSIS — D631 Anemia in chronic kidney disease: Secondary | ICD-10-CM | POA: Diagnosis not present

## 2023-06-09 DIAGNOSIS — Z4901 Encounter for fitting and adjustment of extracorporeal dialysis catheter: Secondary | ICD-10-CM | POA: Diagnosis not present

## 2023-06-09 DIAGNOSIS — I120 Hypertensive chronic kidney disease with stage 5 chronic kidney disease or end stage renal disease: Secondary | ICD-10-CM | POA: Diagnosis not present

## 2023-06-09 DIAGNOSIS — T8242XA Displacement of vascular dialysis catheter, initial encounter: Secondary | ICD-10-CM | POA: Diagnosis not present

## 2023-06-09 DIAGNOSIS — T8249XA Other complication of vascular dialysis catheter, initial encounter: Secondary | ICD-10-CM | POA: Diagnosis not present

## 2023-06-09 DIAGNOSIS — N186 End stage renal disease: Secondary | ICD-10-CM | POA: Diagnosis not present

## 2023-06-10 DIAGNOSIS — N186 End stage renal disease: Secondary | ICD-10-CM | POA: Diagnosis not present

## 2023-06-10 DIAGNOSIS — N2581 Secondary hyperparathyroidism of renal origin: Secondary | ICD-10-CM | POA: Diagnosis not present

## 2023-06-10 DIAGNOSIS — Z992 Dependence on renal dialysis: Secondary | ICD-10-CM | POA: Diagnosis not present

## 2023-06-10 DIAGNOSIS — E8770 Fluid overload, unspecified: Secondary | ICD-10-CM | POA: Diagnosis not present

## 2023-06-13 DIAGNOSIS — E8770 Fluid overload, unspecified: Secondary | ICD-10-CM | POA: Diagnosis not present

## 2023-06-13 DIAGNOSIS — N186 End stage renal disease: Secondary | ICD-10-CM | POA: Diagnosis not present

## 2023-06-13 DIAGNOSIS — N2581 Secondary hyperparathyroidism of renal origin: Secondary | ICD-10-CM | POA: Diagnosis not present

## 2023-06-13 DIAGNOSIS — Z992 Dependence on renal dialysis: Secondary | ICD-10-CM | POA: Diagnosis not present

## 2023-06-14 DIAGNOSIS — E8770 Fluid overload, unspecified: Secondary | ICD-10-CM | POA: Diagnosis not present

## 2023-06-14 DIAGNOSIS — Z992 Dependence on renal dialysis: Secondary | ICD-10-CM | POA: Diagnosis not present

## 2023-06-14 DIAGNOSIS — N186 End stage renal disease: Secondary | ICD-10-CM | POA: Diagnosis not present

## 2023-06-14 DIAGNOSIS — N2581 Secondary hyperparathyroidism of renal origin: Secondary | ICD-10-CM | POA: Diagnosis not present

## 2023-06-15 DIAGNOSIS — N186 End stage renal disease: Secondary | ICD-10-CM | POA: Diagnosis not present

## 2023-06-15 DIAGNOSIS — N2581 Secondary hyperparathyroidism of renal origin: Secondary | ICD-10-CM | POA: Diagnosis not present

## 2023-06-15 DIAGNOSIS — E8770 Fluid overload, unspecified: Secondary | ICD-10-CM | POA: Diagnosis not present

## 2023-06-15 DIAGNOSIS — Z992 Dependence on renal dialysis: Secondary | ICD-10-CM | POA: Diagnosis not present

## 2023-06-17 DIAGNOSIS — N186 End stage renal disease: Secondary | ICD-10-CM | POA: Diagnosis not present

## 2023-06-17 DIAGNOSIS — Z992 Dependence on renal dialysis: Secondary | ICD-10-CM | POA: Diagnosis not present

## 2023-06-17 DIAGNOSIS — N2581 Secondary hyperparathyroidism of renal origin: Secondary | ICD-10-CM | POA: Diagnosis not present

## 2023-06-17 DIAGNOSIS — E8770 Fluid overload, unspecified: Secondary | ICD-10-CM | POA: Diagnosis not present

## 2023-06-20 DIAGNOSIS — E8770 Fluid overload, unspecified: Secondary | ICD-10-CM | POA: Diagnosis not present

## 2023-06-20 DIAGNOSIS — N186 End stage renal disease: Secondary | ICD-10-CM | POA: Diagnosis not present

## 2023-06-20 DIAGNOSIS — Z992 Dependence on renal dialysis: Secondary | ICD-10-CM | POA: Diagnosis not present

## 2023-06-20 DIAGNOSIS — N2581 Secondary hyperparathyroidism of renal origin: Secondary | ICD-10-CM | POA: Diagnosis not present

## 2023-06-22 DIAGNOSIS — E8770 Fluid overload, unspecified: Secondary | ICD-10-CM | POA: Diagnosis not present

## 2023-06-22 DIAGNOSIS — Z992 Dependence on renal dialysis: Secondary | ICD-10-CM | POA: Diagnosis not present

## 2023-06-22 DIAGNOSIS — N2581 Secondary hyperparathyroidism of renal origin: Secondary | ICD-10-CM | POA: Diagnosis not present

## 2023-06-22 DIAGNOSIS — N186 End stage renal disease: Secondary | ICD-10-CM | POA: Diagnosis not present

## 2023-06-24 DIAGNOSIS — N186 End stage renal disease: Secondary | ICD-10-CM | POA: Diagnosis not present

## 2023-06-24 DIAGNOSIS — Z992 Dependence on renal dialysis: Secondary | ICD-10-CM | POA: Diagnosis not present

## 2023-06-24 DIAGNOSIS — E8770 Fluid overload, unspecified: Secondary | ICD-10-CM | POA: Diagnosis not present

## 2023-06-24 DIAGNOSIS — N2581 Secondary hyperparathyroidism of renal origin: Secondary | ICD-10-CM | POA: Diagnosis not present

## 2023-06-27 DIAGNOSIS — N186 End stage renal disease: Secondary | ICD-10-CM | POA: Diagnosis not present

## 2023-06-27 DIAGNOSIS — Z992 Dependence on renal dialysis: Secondary | ICD-10-CM | POA: Diagnosis not present

## 2023-06-27 DIAGNOSIS — N2581 Secondary hyperparathyroidism of renal origin: Secondary | ICD-10-CM | POA: Diagnosis not present

## 2023-06-27 DIAGNOSIS — E8770 Fluid overload, unspecified: Secondary | ICD-10-CM | POA: Diagnosis not present

## 2023-06-29 DIAGNOSIS — N186 End stage renal disease: Secondary | ICD-10-CM | POA: Diagnosis not present

## 2023-06-29 DIAGNOSIS — N2581 Secondary hyperparathyroidism of renal origin: Secondary | ICD-10-CM | POA: Diagnosis not present

## 2023-06-29 DIAGNOSIS — E8770 Fluid overload, unspecified: Secondary | ICD-10-CM | POA: Diagnosis not present

## 2023-06-29 DIAGNOSIS — Z992 Dependence on renal dialysis: Secondary | ICD-10-CM | POA: Diagnosis not present

## 2023-07-01 DIAGNOSIS — N2581 Secondary hyperparathyroidism of renal origin: Secondary | ICD-10-CM | POA: Diagnosis not present

## 2023-07-01 DIAGNOSIS — Z992 Dependence on renal dialysis: Secondary | ICD-10-CM | POA: Diagnosis not present

## 2023-07-01 DIAGNOSIS — N186 End stage renal disease: Secondary | ICD-10-CM | POA: Diagnosis not present

## 2023-07-04 DIAGNOSIS — N186 End stage renal disease: Secondary | ICD-10-CM | POA: Diagnosis not present

## 2023-07-04 DIAGNOSIS — Z992 Dependence on renal dialysis: Secondary | ICD-10-CM | POA: Diagnosis not present

## 2023-07-04 DIAGNOSIS — N2581 Secondary hyperparathyroidism of renal origin: Secondary | ICD-10-CM | POA: Diagnosis not present

## 2023-07-06 DIAGNOSIS — N2581 Secondary hyperparathyroidism of renal origin: Secondary | ICD-10-CM | POA: Diagnosis not present

## 2023-07-06 DIAGNOSIS — N186 End stage renal disease: Secondary | ICD-10-CM | POA: Diagnosis not present

## 2023-07-06 DIAGNOSIS — Z992 Dependence on renal dialysis: Secondary | ICD-10-CM | POA: Diagnosis not present

## 2023-07-08 DIAGNOSIS — Z992 Dependence on renal dialysis: Secondary | ICD-10-CM | POA: Diagnosis not present

## 2023-07-08 DIAGNOSIS — N2581 Secondary hyperparathyroidism of renal origin: Secondary | ICD-10-CM | POA: Diagnosis not present

## 2023-07-08 DIAGNOSIS — N186 End stage renal disease: Secondary | ICD-10-CM | POA: Diagnosis not present

## 2023-07-11 DIAGNOSIS — N186 End stage renal disease: Secondary | ICD-10-CM | POA: Diagnosis not present

## 2023-07-11 DIAGNOSIS — Z992 Dependence on renal dialysis: Secondary | ICD-10-CM | POA: Diagnosis not present

## 2023-07-11 DIAGNOSIS — N2581 Secondary hyperparathyroidism of renal origin: Secondary | ICD-10-CM | POA: Diagnosis not present

## 2023-07-13 DIAGNOSIS — Z992 Dependence on renal dialysis: Secondary | ICD-10-CM | POA: Diagnosis not present

## 2023-07-13 DIAGNOSIS — N186 End stage renal disease: Secondary | ICD-10-CM | POA: Diagnosis not present

## 2023-07-13 DIAGNOSIS — N2581 Secondary hyperparathyroidism of renal origin: Secondary | ICD-10-CM | POA: Diagnosis not present

## 2023-07-14 ENCOUNTER — Encounter: Payer: Self-pay | Admitting: Internal Medicine

## 2023-07-14 ENCOUNTER — Ambulatory Visit (INDEPENDENT_AMBULATORY_CARE_PROVIDER_SITE_OTHER): Payer: Medicare HMO | Admitting: Internal Medicine

## 2023-07-14 VITALS — BP 118/76 | HR 118 | Ht 68.0 in | Wt 266.4 lb

## 2023-07-14 DIAGNOSIS — Z6832 Body mass index (BMI) 32.0-32.9, adult: Secondary | ICD-10-CM | POA: Diagnosis not present

## 2023-07-14 DIAGNOSIS — E6609 Other obesity due to excess calories: Secondary | ICD-10-CM | POA: Diagnosis not present

## 2023-07-14 DIAGNOSIS — N186 End stage renal disease: Secondary | ICD-10-CM | POA: Diagnosis not present

## 2023-07-14 DIAGNOSIS — E66811 Obesity, class 1: Secondary | ICD-10-CM

## 2023-07-14 DIAGNOSIS — I10 Essential (primary) hypertension: Secondary | ICD-10-CM

## 2023-07-14 NOTE — Progress Notes (Signed)
Established Patient Office Visit  Subjective:  Patient ID: Joseph Gilmore, male    DOB: 07-21-1971  Age: 52 y.o. MRN: [id number]  Chief Complaint  Patient presents with   Follow-up    3 month BP follow up    No new complaints, here for lab review and medication refills.   No other concerns at this time.   Past Medical History:  Diagnosis Date   Autism    Chronic kidney disease    Hypertension    Murmur    OCD (obsessive compulsive disorder)     Past Surgical History:  Procedure Laterality Date   A/V FISTULAGRAM Left 10/07/2020   Procedure: A/V FISTULAGRAM;  Surgeon: Renford Dills, MD;  Location: [facility] INVASIVE CV LAB;  Service: Cardiovascular;  Laterality: Left;   A/V FISTULAGRAM Left 02/15/2023   Procedure: A/V Fistulagram;  Surgeon: Renford Dills, MD;  Location: [facility] INVASIVE CV LAB;  Service: Cardiovascular;  Laterality: Left;   AV FISTULA PLACEMENT Left 07/15/2020   Procedure: ARTERIOVENOUS FISTULA CREATION;  Surgeon: Renford Dills, MD;  Location: [facility] ORS;  Service: Vascular;  Laterality: Left;   DIALYSIS/PERMA CATHETER INSERTION N/A 04/06/2020   Procedure: DIALYSIS/PERMA CATHETER INSERTION;  Surgeon: Annice Needy, MD;  Location: [facility] INVASIVE CV LAB;  Service: Cardiovascular;  Laterality: N/A;   DIALYSIS/PERMA CATHETER INSERTION N/A 05/26/2020   Procedure: DIALYSIS/PERMA CATHETER INSERTION;  Surgeon: Renford Dills, MD;  Location: [facility] INVASIVE CV LAB;  Service: Cardiovascular;  Laterality: N/A;   DIALYSIS/PERMA CATHETER INSERTION N/A 07/09/2020   Procedure: DIALYSIS/PERMA CATHETER INSERTION;  Surgeon: Annice Needy, MD;  Location: [facility] INVASIVE CV LAB;  Service: Cardiovascular;  Laterality: N/A;   DIALYSIS/PERMA CATHETER INSERTION N/A 01/08/2021   Procedure: DIALYSIS/PERMA CATHETER INSERTION;  Surgeon: Annice Needy, MD;  Location: [facility] INVASIVE CV LAB;  Service: Cardiovascular;  Laterality: N/A;   DIALYSIS/PERMA CATHETER INSERTION N/A 03/17/2023    Procedure: DIALYSIS/PERMA CATHETER INSERTION;  Surgeon: Renford Dills, MD;  Location: [facility] INVASIVE CV LAB;  Service: Cardiovascular;  Laterality: N/A;   DIALYSIS/PERMA CATHETER REMOVAL N/A 03/15/2021   Procedure: DIALYSIS/PERMA CATHETER REMOVAL;  Surgeon: Annice Needy, MD;  Location: [facility] INVASIVE CV LAB;  Service: Cardiovascular;  Laterality: N/A;   REVISON OF ARTERIOVENOUS FISTULA Left 12/11/2020   Procedure: ARTERIOVENOUS (AV) FISTULA CREATION ( BRACHIAL CEPHALIC );  Surgeon: Renford Dills, MD;  Location: [facility] ORS;  Service: Vascular;  Laterality: Left;   TEMPORARY DIALYSIS CATHETER N/A 03/15/2023   Procedure: TEMPORARY DIALYSIS CATHETER;  Surgeon: Renford Dills, MD;  Location: [facility] INVASIVE CV LAB;  Service: Cardiovascular;  Laterality: N/A;    Social History   Socioeconomic History   Marital status: Single    Spouse name: Not on file   Number of children: Not on file   Years of education: Not on file   Highest education level: Not on file  Occupational History   Not on file  Tobacco Use   Smoking status: Never    Passive exposure: Never   Smokeless tobacco: Never  Vaping Use   Vaping status: Never Used  Substance and Sexual Activity   Alcohol use: Never   Drug use: Never   Sexual activity: Not Currently  Other Topics Concern   Not on file  Social History Narrative   Lives with father .   Social Determinants of Health   Financial Resource Strain: Not on file  Food Insecurity: No Food Insecurity (05/10/2022)   Hunger Vital Sign    Worried  About Running Out of Food in the Last Year: Never true    Ran Out of Food in the Last Year: Never true  Transportation Needs: No Transportation Needs (05/10/2022)   PRAPARE - Administrator, Civil Service (Medical): No    Lack of Transportation (Non-Medical): No  Physical Activity: Not on file  Stress: Not on file  Social Connections: Not on file  Intimate Partner Violence: Not on file    No family history  on file.  Allergies  Allergen Reactions   Chlorhexidine Other (See Comments)    Blisters (topical)    Outpatient Medications Prior to Visit  Medication Sig   Cholecalciferol (VITAMIN D-3) 125 MCG (5000 UT) TABS Take 5,000 Units by mouth daily.   dutasteride (AVODART) 0.5 MG capsule Take 1 capsule (0.5 mg total) by mouth daily.   midodrine (PROAMATINE) 2.5 MG tablet Take 2.5 mg by mouth 3 (three) times daily with meals.   multivitamin (RENA-VIT) TABS tablet Take by mouth.   tamsulosin (FLOMAX) 0.4 MG CAPS capsule Take 1 capsule (0.4 mg total) by mouth daily.   calcium acetate (PHOSLO) 667 MG capsule  (Patient not taking: Reported on 01/09/2023)   No facility-administered medications prior to visit.    Review of Systems  Constitutional: Negative.   HENT: Negative.    Eyes: Negative.   Respiratory: Negative.    Cardiovascular: Negative.   Gastrointestinal: Negative.   Genitourinary: Negative.   Skin: Negative.   Neurological: Negative.   Endo/Heme/Allergies: Negative.        Objective:   BP 118/76   Pulse (!) 118   Ht 5\' 8"  (1.727 m)   Wt 266 lb 6.4 oz (120.8 kg)   SpO2 97%   BMI 40.51 kg/m   Vitals:   07/14/23 1143  BP: 118/76  Pulse: (!) 118  Height: 5\' 8"  (1.727 m)  Weight: 266 lb 6.4 oz (120.8 kg)  SpO2: 97%  BMI (Calculated): 40.52    Physical Exam Vitals reviewed.  Constitutional:      Appearance: Normal appearance. He is obese.  HENT:     Head: Normocephalic.     Left Ear: There is no impacted cerumen.     Nose: Nose normal.     Mouth/Throat:     Mouth: Mucous membranes are moist.     Pharynx: No posterior oropharyngeal erythema.  Eyes:     Extraocular Movements: Extraocular movements intact.     Pupils: Pupils are equal, round, and reactive to light.  Cardiovascular:     Rate and Rhythm: Regular rhythm.     Chest Wall: PMI is not displaced.     Pulses: Normal pulses.     Heart sounds: Normal heart sounds. No murmur heard. Pulmonary:      Effort: Pulmonary effort is normal.     Breath sounds: Normal air entry. No rhonchi or rales.  Chest:     Comments: Left HD catheter site fine. Abdominal:     General: Abdomen is flat. Bowel sounds are normal. There is no distension.     Palpations: Abdomen is soft. There is no hepatomegaly, splenomegaly or mass.     Tenderness: There is no abdominal tenderness.  Musculoskeletal:        General: Normal range of motion.     Cervical back: Normal range of motion and neck supple.     Right lower leg: No edema.     Left lower leg: No edema.  Skin:    General: Skin is warm  and dry.  Neurological:     General: No focal deficit present.     Mental Status: He is alert and oriented to person, place, and time.     Cranial Nerves: No cranial nerve deficit.     Motor: No weakness.  Psychiatric:        Mood and Affect: Mood normal.        Behavior: Behavior normal.      No results found for any visits on 07/14/23.  No results found for this or any previous visit (from the past 2160 hour(Sheilia Reznick)).    Assessment & Plan:  As per problem list  Problem List Items Addressed This Visit       Cardiovascular and Mediastinum   Essential hypertension - Primary   Relevant Orders   RSV,Recombinant PF (Arexvy)     Genitourinary   End stage renal disease (HCC)   Other Visit Diagnoses     Class 1 obesity due to excess calories with serious comorbidity and body mass index (BMI) of 32.0 to 32.9 in adult           Return in about 3 months (around 10/14/2023) for BP followup.   Total time spent: 20 minutes  Luna Fuse, MD  07/14/2023   This document may have been prepared by [facility] Voice Recognition software and as such may include unintentional dictation errors.

## 2023-07-15 DIAGNOSIS — N2581 Secondary hyperparathyroidism of renal origin: Secondary | ICD-10-CM | POA: Diagnosis not present

## 2023-07-15 DIAGNOSIS — N186 End stage renal disease: Secondary | ICD-10-CM | POA: Diagnosis not present

## 2023-07-15 DIAGNOSIS — Z992 Dependence on renal dialysis: Secondary | ICD-10-CM | POA: Diagnosis not present

## 2023-07-18 DIAGNOSIS — N2581 Secondary hyperparathyroidism of renal origin: Secondary | ICD-10-CM | POA: Diagnosis not present

## 2023-07-18 DIAGNOSIS — N186 End stage renal disease: Secondary | ICD-10-CM | POA: Diagnosis not present

## 2023-07-18 DIAGNOSIS — Z992 Dependence on renal dialysis: Secondary | ICD-10-CM | POA: Diagnosis not present

## 2023-07-20 DIAGNOSIS — Z992 Dependence on renal dialysis: Secondary | ICD-10-CM | POA: Diagnosis not present

## 2023-07-20 DIAGNOSIS — N2581 Secondary hyperparathyroidism of renal origin: Secondary | ICD-10-CM | POA: Diagnosis not present

## 2023-07-20 DIAGNOSIS — N186 End stage renal disease: Secondary | ICD-10-CM | POA: Diagnosis not present

## 2023-07-22 DIAGNOSIS — N2581 Secondary hyperparathyroidism of renal origin: Secondary | ICD-10-CM | POA: Diagnosis not present

## 2023-07-22 DIAGNOSIS — Z992 Dependence on renal dialysis: Secondary | ICD-10-CM | POA: Diagnosis not present

## 2023-07-22 DIAGNOSIS — N186 End stage renal disease: Secondary | ICD-10-CM | POA: Diagnosis not present

## 2023-07-24 ENCOUNTER — Other Ambulatory Visit: Payer: Self-pay | Admitting: Urology

## 2023-07-24 DIAGNOSIS — N186 End stage renal disease: Secondary | ICD-10-CM | POA: Diagnosis not present

## 2023-07-24 DIAGNOSIS — Z992 Dependence on renal dialysis: Secondary | ICD-10-CM | POA: Diagnosis not present

## 2023-07-24 DIAGNOSIS — N2581 Secondary hyperparathyroidism of renal origin: Secondary | ICD-10-CM | POA: Diagnosis not present

## 2023-07-26 DIAGNOSIS — Z992 Dependence on renal dialysis: Secondary | ICD-10-CM | POA: Diagnosis not present

## 2023-07-26 DIAGNOSIS — N2581 Secondary hyperparathyroidism of renal origin: Secondary | ICD-10-CM | POA: Diagnosis not present

## 2023-07-26 DIAGNOSIS — N186 End stage renal disease: Secondary | ICD-10-CM | POA: Diagnosis not present

## 2023-07-29 DIAGNOSIS — N186 End stage renal disease: Secondary | ICD-10-CM | POA: Diagnosis not present

## 2023-07-29 DIAGNOSIS — N2581 Secondary hyperparathyroidism of renal origin: Secondary | ICD-10-CM | POA: Diagnosis not present

## 2023-07-29 DIAGNOSIS — Z992 Dependence on renal dialysis: Secondary | ICD-10-CM | POA: Diagnosis not present

## 2023-08-01 DIAGNOSIS — Z992 Dependence on renal dialysis: Secondary | ICD-10-CM | POA: Diagnosis not present

## 2023-08-01 DIAGNOSIS — N2581 Secondary hyperparathyroidism of renal origin: Secondary | ICD-10-CM | POA: Diagnosis not present

## 2023-08-01 DIAGNOSIS — N186 End stage renal disease: Secondary | ICD-10-CM | POA: Diagnosis not present

## 2023-08-03 DIAGNOSIS — N186 End stage renal disease: Secondary | ICD-10-CM | POA: Diagnosis not present

## 2023-08-03 DIAGNOSIS — Z992 Dependence on renal dialysis: Secondary | ICD-10-CM | POA: Diagnosis not present

## 2023-08-03 DIAGNOSIS — N2581 Secondary hyperparathyroidism of renal origin: Secondary | ICD-10-CM | POA: Diagnosis not present

## 2023-08-04 ENCOUNTER — Other Ambulatory Visit: Payer: Medicare HMO

## 2023-08-05 DIAGNOSIS — Z992 Dependence on renal dialysis: Secondary | ICD-10-CM | POA: Diagnosis not present

## 2023-08-05 DIAGNOSIS — N186 End stage renal disease: Secondary | ICD-10-CM | POA: Diagnosis not present

## 2023-08-05 DIAGNOSIS — N2581 Secondary hyperparathyroidism of renal origin: Secondary | ICD-10-CM | POA: Diagnosis not present

## 2023-08-08 DIAGNOSIS — Z992 Dependence on renal dialysis: Secondary | ICD-10-CM | POA: Diagnosis not present

## 2023-08-08 DIAGNOSIS — N186 End stage renal disease: Secondary | ICD-10-CM | POA: Diagnosis not present

## 2023-08-08 DIAGNOSIS — N2581 Secondary hyperparathyroidism of renal origin: Secondary | ICD-10-CM | POA: Diagnosis not present

## 2023-08-09 ENCOUNTER — Ambulatory Visit: Payer: Medicare HMO | Admitting: Urology

## 2023-08-10 DIAGNOSIS — N186 End stage renal disease: Secondary | ICD-10-CM | POA: Diagnosis not present

## 2023-08-10 DIAGNOSIS — N2581 Secondary hyperparathyroidism of renal origin: Secondary | ICD-10-CM | POA: Diagnosis not present

## 2023-08-10 DIAGNOSIS — Z992 Dependence on renal dialysis: Secondary | ICD-10-CM | POA: Diagnosis not present

## 2023-08-12 DIAGNOSIS — N186 End stage renal disease: Secondary | ICD-10-CM | POA: Diagnosis not present

## 2023-08-12 DIAGNOSIS — Z992 Dependence on renal dialysis: Secondary | ICD-10-CM | POA: Diagnosis not present

## 2023-08-12 DIAGNOSIS — N2581 Secondary hyperparathyroidism of renal origin: Secondary | ICD-10-CM | POA: Diagnosis not present

## 2023-08-15 DIAGNOSIS — N2581 Secondary hyperparathyroidism of renal origin: Secondary | ICD-10-CM | POA: Diagnosis not present

## 2023-08-15 DIAGNOSIS — Z992 Dependence on renal dialysis: Secondary | ICD-10-CM | POA: Diagnosis not present

## 2023-08-15 DIAGNOSIS — N186 End stage renal disease: Secondary | ICD-10-CM | POA: Diagnosis not present

## 2023-08-16 DIAGNOSIS — D631 Anemia in chronic kidney disease: Secondary | ICD-10-CM | POA: Diagnosis not present

## 2023-08-16 DIAGNOSIS — Z4901 Encounter for fitting and adjustment of extracorporeal dialysis catheter: Secondary | ICD-10-CM | POA: Diagnosis not present

## 2023-08-16 DIAGNOSIS — T8249XA Other complication of vascular dialysis catheter, initial encounter: Secondary | ICD-10-CM | POA: Diagnosis not present

## 2023-08-16 DIAGNOSIS — T82828A Fibrosis of vascular prosthetic devices, implants and grafts, initial encounter: Secondary | ICD-10-CM | POA: Diagnosis not present

## 2023-08-16 DIAGNOSIS — I120 Hypertensive chronic kidney disease with stage 5 chronic kidney disease or end stage renal disease: Secondary | ICD-10-CM | POA: Diagnosis not present

## 2023-08-16 DIAGNOSIS — N186 End stage renal disease: Secondary | ICD-10-CM | POA: Diagnosis not present

## 2023-08-17 DIAGNOSIS — Z992 Dependence on renal dialysis: Secondary | ICD-10-CM | POA: Diagnosis not present

## 2023-08-17 DIAGNOSIS — N2581 Secondary hyperparathyroidism of renal origin: Secondary | ICD-10-CM | POA: Diagnosis not present

## 2023-08-17 DIAGNOSIS — N186 End stage renal disease: Secondary | ICD-10-CM | POA: Diagnosis not present

## 2023-08-19 DIAGNOSIS — N2581 Secondary hyperparathyroidism of renal origin: Secondary | ICD-10-CM | POA: Diagnosis not present

## 2023-08-19 DIAGNOSIS — N186 End stage renal disease: Secondary | ICD-10-CM | POA: Diagnosis not present

## 2023-08-19 DIAGNOSIS — Z992 Dependence on renal dialysis: Secondary | ICD-10-CM | POA: Diagnosis not present

## 2023-08-21 DIAGNOSIS — N186 End stage renal disease: Secondary | ICD-10-CM | POA: Diagnosis not present

## 2023-08-21 DIAGNOSIS — Z992 Dependence on renal dialysis: Secondary | ICD-10-CM | POA: Diagnosis not present

## 2023-08-21 DIAGNOSIS — N2581 Secondary hyperparathyroidism of renal origin: Secondary | ICD-10-CM | POA: Diagnosis not present

## 2023-08-24 DIAGNOSIS — N186 End stage renal disease: Secondary | ICD-10-CM | POA: Diagnosis not present

## 2023-08-24 DIAGNOSIS — N2581 Secondary hyperparathyroidism of renal origin: Secondary | ICD-10-CM | POA: Diagnosis not present

## 2023-08-24 DIAGNOSIS — Z992 Dependence on renal dialysis: Secondary | ICD-10-CM | POA: Diagnosis not present

## 2023-08-25 ENCOUNTER — Other Ambulatory Visit (INDEPENDENT_AMBULATORY_CARE_PROVIDER_SITE_OTHER): Payer: Self-pay | Admitting: Nurse Practitioner

## 2023-08-25 DIAGNOSIS — N186 End stage renal disease: Secondary | ICD-10-CM

## 2023-08-28 ENCOUNTER — Encounter (INDEPENDENT_AMBULATORY_CARE_PROVIDER_SITE_OTHER): Payer: Medicare HMO

## 2023-08-28 ENCOUNTER — Other Ambulatory Visit (INDEPENDENT_AMBULATORY_CARE_PROVIDER_SITE_OTHER): Payer: Medicare HMO

## 2023-08-28 DIAGNOSIS — Z4901 Encounter for fitting and adjustment of extracorporeal dialysis catheter: Secondary | ICD-10-CM | POA: Diagnosis not present

## 2023-08-28 DIAGNOSIS — T82828A Fibrosis of vascular prosthetic devices, implants and grafts, initial encounter: Secondary | ICD-10-CM | POA: Diagnosis not present

## 2023-08-28 DIAGNOSIS — N186 End stage renal disease: Secondary | ICD-10-CM | POA: Diagnosis not present

## 2023-08-28 DIAGNOSIS — D631 Anemia in chronic kidney disease: Secondary | ICD-10-CM | POA: Diagnosis not present

## 2023-08-28 DIAGNOSIS — T8249XA Other complication of vascular dialysis catheter, initial encounter: Secondary | ICD-10-CM | POA: Diagnosis not present

## 2023-08-28 DIAGNOSIS — I120 Hypertensive chronic kidney disease with stage 5 chronic kidney disease or end stage renal disease: Secondary | ICD-10-CM | POA: Diagnosis not present

## 2023-08-29 ENCOUNTER — Ambulatory Visit (INDEPENDENT_AMBULATORY_CARE_PROVIDER_SITE_OTHER): Payer: Medicare HMO

## 2023-08-29 DIAGNOSIS — Z992 Dependence on renal dialysis: Secondary | ICD-10-CM | POA: Diagnosis not present

## 2023-08-29 DIAGNOSIS — N186 End stage renal disease: Secondary | ICD-10-CM | POA: Diagnosis not present

## 2023-08-29 DIAGNOSIS — N2581 Secondary hyperparathyroidism of renal origin: Secondary | ICD-10-CM | POA: Diagnosis not present

## 2023-08-31 DIAGNOSIS — N2581 Secondary hyperparathyroidism of renal origin: Secondary | ICD-10-CM | POA: Diagnosis not present

## 2023-08-31 DIAGNOSIS — Z992 Dependence on renal dialysis: Secondary | ICD-10-CM | POA: Diagnosis not present

## 2023-08-31 DIAGNOSIS — N186 End stage renal disease: Secondary | ICD-10-CM | POA: Diagnosis not present

## 2023-09-02 DIAGNOSIS — N2581 Secondary hyperparathyroidism of renal origin: Secondary | ICD-10-CM | POA: Diagnosis not present

## 2023-09-02 DIAGNOSIS — Z992 Dependence on renal dialysis: Secondary | ICD-10-CM | POA: Diagnosis not present

## 2023-09-02 DIAGNOSIS — N186 End stage renal disease: Secondary | ICD-10-CM | POA: Diagnosis not present

## 2023-09-04 ENCOUNTER — Encounter (INDEPENDENT_AMBULATORY_CARE_PROVIDER_SITE_OTHER): Payer: Self-pay | Admitting: Nurse Practitioner

## 2023-09-04 ENCOUNTER — Ambulatory Visit (INDEPENDENT_AMBULATORY_CARE_PROVIDER_SITE_OTHER): Payer: Medicare HMO | Admitting: Nurse Practitioner

## 2023-09-04 VITALS — BP 140/75 | HR 93 | Resp 18 | Ht 68.0 in | Wt 266.0 lb

## 2023-09-04 DIAGNOSIS — I10 Essential (primary) hypertension: Secondary | ICD-10-CM

## 2023-09-04 DIAGNOSIS — N186 End stage renal disease: Secondary | ICD-10-CM | POA: Diagnosis not present

## 2023-09-04 NOTE — Progress Notes (Signed)
 Subjective:    Patient ID: Joseph Gilmore, male    DOB: 06/10/1971, 53 y.o.   MRN: [id number] Chief Complaint  Patient presents with   Follow-up    LS 8/24. UE vmap/UE art/consult. failed access. kolluru.    The patient is a 53 year old male who presents today for follow-up regarding his permanent dialysis access.  He is currently maintained via PermCath.  Currently this is working well.  He previously had a left brachiocephalic AV fistula however it has been occluded and is no longer functional.  He had recent vein mapping which shows the patency of the left cephalic vein at shoulder, but distally it is chronically occluded.  His right upper extremity duplex showed borderline vein diameters for creation of a brachiocephalic AV fistula.  Currently the patient is autistic and much of the discussion today involves his father.    Review of Systems  All other systems reviewed and are negative.      Objective:   Physical Exam Vitals reviewed.  HENT:     Head: Normocephalic.  Cardiovascular:     Rate and Rhythm: Normal rate.     Pulses:          Radial pulses are 2+ on the right side and 2+ on the left side.  Pulmonary:     Effort: Pulmonary effort is normal.  Skin:    General: Skin is warm and dry.  Neurological:     Mental Status: He is alert. Mental status is at baseline.  Psychiatric:        Mood and Affect: Mood normal.        Behavior: Behavior normal.        Thought Content: Thought content normal.        Judgment: Judgment normal.     BP (!) 140/75   Pulse 93   Resp 18   Ht 5' 8 (1.727 m)   Wt 266 lb (120.7 kg)   BMI 40.45 kg/m   Past Medical History:  Diagnosis Date   Autism    Chronic kidney disease    Hypertension    Murmur    OCD (obsessive compulsive disorder)     Social History   Socioeconomic History   Marital status: Single    Spouse name: Not on file   Number of children: Not on file   Years of education: Not on file   Highest education  level: Not on file  Occupational History   Not on file  Tobacco Use   Smoking status: Never    Passive exposure: Never   Smokeless tobacco: Never  Vaping Use   Vaping status: Never Used  Substance and Sexual Activity   Alcohol use: Never   Drug use: Never   Sexual activity: Not Currently  Other Topics Concern   Not on file  Social History Narrative   Lives with father .   Social Drivers of Corporate Investment Banker Strain: Not on file  Food Insecurity: No Food Insecurity (05/10/2022)   Hunger Vital Sign    Worried About Running Out of Food in the Last Year: Never true    Ran Out of Food in the Last Year: Never true  Transportation Needs: No Transportation Needs (05/10/2022)   PRAPARE - Administrator, Civil Service (Medical): No    Lack of Transportation (Non-Medical): No  Physical Activity: Not on file  Stress: Not on file  Social Connections: Not on file  Intimate Partner Violence: Not  on file    Past Surgical History:  Procedure Laterality Date   A/V FISTULAGRAM Left 10/07/2020   Procedure: A/V FISTULAGRAM;  Surgeon: Jama Cordella [name], MD;  Location: [facility] INVASIVE CV LAB;  Service: Cardiovascular;  Laterality: Left;   A/V FISTULAGRAM Left 02/15/2023   Procedure: A/V Fistulagram;  Surgeon: Jama Cordella [name], MD;  Location: [facility] INVASIVE CV LAB;  Service: Cardiovascular;  Laterality: Left;   AV FISTULA PLACEMENT Left 07/15/2020   Procedure: ARTERIOVENOUS FISTULA CREATION;  Surgeon: Jama Cordella [name], MD;  Location: [facility] ORS;  Service: Vascular;  Laterality: Left;   DIALYSIS/PERMA CATHETER INSERTION N/A 04/06/2020   Procedure: DIALYSIS/PERMA CATHETER INSERTION;  Surgeon: Marea Selinda RAMAN, MD;  Location: [facility] INVASIVE CV LAB;  Service: Cardiovascular;  Laterality: N/A;   DIALYSIS/PERMA CATHETER INSERTION N/A 05/26/2020   Procedure: DIALYSIS/PERMA CATHETER INSERTION;  Surgeon: Jama Cordella [name], MD;  Location: [facility] INVASIVE CV LAB;  Service: Cardiovascular;   Laterality: N/A;   DIALYSIS/PERMA CATHETER INSERTION N/A 07/09/2020   Procedure: DIALYSIS/PERMA CATHETER INSERTION;  Surgeon: Marea Selinda RAMAN, MD;  Location: [facility] INVASIVE CV LAB;  Service: Cardiovascular;  Laterality: N/A;   DIALYSIS/PERMA CATHETER INSERTION N/A 01/08/2021   Procedure: DIALYSIS/PERMA CATHETER INSERTION;  Surgeon: Marea Selinda RAMAN, MD;  Location: [facility] INVASIVE CV LAB;  Service: Cardiovascular;  Laterality: N/A;   DIALYSIS/PERMA CATHETER INSERTION N/A 03/17/2023   Procedure: DIALYSIS/PERMA CATHETER INSERTION;  Surgeon: Jama Cordella [name], MD;  Location: [facility] INVASIVE CV LAB;  Service: Cardiovascular;  Laterality: N/A;   DIALYSIS/PERMA CATHETER REMOVAL N/A 03/15/2021   Procedure: DIALYSIS/PERMA CATHETER REMOVAL;  Surgeon: Marea Selinda RAMAN, MD;  Location: [facility] INVASIVE CV LAB;  Service: Cardiovascular;  Laterality: N/A;   REVISON OF ARTERIOVENOUS FISTULA Left 12/11/2020   Procedure: ARTERIOVENOUS (AV) FISTULA CREATION ( BRACHIAL CEPHALIC );  Surgeon: Jama Cordella [name], MD;  Location: [facility] ORS;  Service: Vascular;  Laterality: Left;   TEMPORARY DIALYSIS CATHETER N/A 03/15/2023   Procedure: TEMPORARY DIALYSIS CATHETER;  Surgeon: Jama Cordella [name], MD;  Location: [facility] INVASIVE CV LAB;  Service: Cardiovascular;  Laterality: N/A;    History reviewed. No pertinent family history.  Allergies  Allergen Reactions   Chlorhexidine  Other (See Comments)    Blisters (topical)       Latest Ref Rng & Units 03/16/2023    3:07 AM 03/15/2023    3:10 PM 12/11/2020    9:20 AM  CBC  WBC 4.0 - 10.5 K/uL 9.0  8.6    Hemoglobin 13.0 - 17.0 g/dL 9.8  9.0  88.7   Hematocrit 39.0 - 52.0 % 29.6  27.5  33.0   Platelets 150 - 400 K/uL 185  185        CMP     Component Value Date/Time   NA 135 03/17/2023 1201   K 4.1 03/17/2023 1201   CL 94 (L) 03/17/2023 1201   CO2 25 03/17/2023 1201   GLUCOSE 156 (H) 03/17/2023 1201   BUN 36 (H) 03/17/2023 1201   CREATININE 12.25 (H) 03/17/2023 1201   CALCIUM  7.3 (L)  03/17/2023 1201   PROT 6.5 03/16/2023 0307   ALBUMIN 3.2 (L) 03/16/2023 0307   AST 15 03/16/2023 0307   ALT 13 03/16/2023 0307   ALKPHOS 50 03/16/2023 0307   BILITOT 0.6 03/16/2023 0307   GFRNONAA 5 (L) 03/17/2023 1201     No results found.     Assessment & Plan:   1. ESRD (end stage renal disease) (HCC) (Primary) Recommend:  At this time the patient does not have appropriate  extremity access for dialysis  Following review of images of most recent fistulogram with Dr. Jama as well as looking at the vein mapping results, patient should have a left brachial axillary AV graft inserted.  The risks, benefits and alternative therapies were reviewed in detail with the patient.  All questions were answered.  The patient agrees to proceed with surgery.   The patient will follow up with me in the office after the surgery.  2. Essential (primary) hypertension Continue antihypertensive medications as already ordered, these medications have been reviewed and there are no changes at this time.   Current Outpatient Medications on File Prior to Visit  Medication Sig Dispense Refill   Cholecalciferol  (VITAMIN D -3) 125 MCG (5000 UT) TABS Take 5,000 Units by mouth daily.     dutasteride  (AVODART ) 0.5 MG capsule TAKE ONE CAPSULE BY MOUTH DAILY 90 capsule 3   Methoxy PEG-Epoetin  Beta (MIRCERA IJ) Mircera     midodrine  (PROAMATINE ) 2.5 MG tablet Take 2.5 mg by mouth 3 (three) times daily with meals.     multivitamin (RENA-VIT) TABS tablet Take by mouth.     tamsulosin  (FLOMAX ) 0.4 MG CAPS capsule TAKE ONE CAPSULE BY MOUTH DAILY 90 capsule 3   calcium  acetate (PHOSLO ) 667 MG capsule  (Patient not taking: Reported on 01/09/2023)     No current facility-administered medications on file prior to visit.    There are no Patient Instructions on file for this visit. No follow-ups on file.   Thurston Brendlinger E Teniola Tseng, NP

## 2023-09-04 NOTE — H&P (View-Only) (Signed)
 Subjective:    Patient ID: Joseph Gilmore, male    DOB: 06/10/1971, 53 y.o.   MRN: [id number] Chief Complaint  Patient presents with   Follow-up    LS 8/24. UE vmap/UE art/consult. failed access. kolluru.    The patient is a 53 year old male who presents today for follow-up regarding his permanent dialysis access.  He is currently maintained via PermCath.  Currently this is working well.  He previously had a left brachiocephalic AV fistula however it has been occluded and is no longer functional.  He had recent vein mapping which shows the patency of the left cephalic vein at shoulder, but distally it is chronically occluded.  His right upper extremity duplex showed borderline vein diameters for creation of a brachiocephalic AV fistula.  Currently the patient is autistic and much of the discussion today involves his father.    Review of Systems  All other systems reviewed and are negative.      Objective:   Physical Exam Vitals reviewed.  HENT:     Head: Normocephalic.  Cardiovascular:     Rate and Rhythm: Normal rate.     Pulses:          Radial pulses are 2+ on the right side and 2+ on the left side.  Pulmonary:     Effort: Pulmonary effort is normal.  Skin:    General: Skin is warm and dry.  Neurological:     Mental Status: He is alert. Mental status is at baseline.  Psychiatric:        Mood and Affect: Mood normal.        Behavior: Behavior normal.        Thought Content: Thought content normal.        Judgment: Judgment normal.     BP (!) 140/75   Pulse 93   Resp 18   Ht 5' 8 (1.727 m)   Wt 266 lb (120.7 kg)   BMI 40.45 kg/m   Past Medical History:  Diagnosis Date   Autism    Chronic kidney disease    Hypertension    Murmur    OCD (obsessive compulsive disorder)     Social History   Socioeconomic History   Marital status: Single    Spouse name: Not on file   Number of children: Not on file   Years of education: Not on file   Highest education  level: Not on file  Occupational History   Not on file  Tobacco Use   Smoking status: Never    Passive exposure: Never   Smokeless tobacco: Never  Vaping Use   Vaping status: Never Used  Substance and Sexual Activity   Alcohol use: Never   Drug use: Never   Sexual activity: Not Currently  Other Topics Concern   Not on file  Social History Narrative   Lives with father .   Social Drivers of Corporate Investment Banker Strain: Not on file  Food Insecurity: No Food Insecurity (05/10/2022)   Hunger Vital Sign    Worried About Running Out of Food in the Last Year: Never true    Ran Out of Food in the Last Year: Never true  Transportation Needs: No Transportation Needs (05/10/2022)   PRAPARE - Administrator, Civil Service (Medical): No    Lack of Transportation (Non-Medical): No  Physical Activity: Not on file  Stress: Not on file  Social Connections: Not on file  Intimate Partner Violence: Not  on file    Past Surgical History:  Procedure Laterality Date   A/V FISTULAGRAM Left 10/07/2020   Procedure: A/V FISTULAGRAM;  Surgeon: Jama Cordella [name], MD;  Location: [facility];  Service: Cardiovascular;  Laterality: Left;   A/V FISTULAGRAM Left 02/15/2023   Procedure: A/V Fistulagram;  Surgeon: Jama Cordella [name], MD;  Location: [facility];  Service: Cardiovascular;  Laterality: Left;   AV FISTULA PLACEMENT Left 07/15/2020   Procedure: ARTERIOVENOUS FISTULA CREATION;  Surgeon: Jama Cordella [name], MD;  Location: [facility];  Service: Vascular;  Laterality: Left;   DIALYSIS/PERMA CATHETER INSERTION N/A 04/06/2020   Procedure: DIALYSIS/PERMA CATHETER INSERTION;  Surgeon: Marea Selinda RAMAN, MD;  Location: [facility];  Service: Cardiovascular;  Laterality: N/A;   DIALYSIS/PERMA CATHETER INSERTION N/A 05/26/2020   Procedure: DIALYSIS/PERMA CATHETER INSERTION;  Surgeon: Jama Cordella [name], MD;  Location: [facility];  Service: Cardiovascular;   Laterality: N/A;   DIALYSIS/PERMA CATHETER INSERTION N/A 07/09/2020   Procedure: DIALYSIS/PERMA CATHETER INSERTION;  Surgeon: Marea Selinda RAMAN, MD;  Location: [facility];  Service: Cardiovascular;  Laterality: N/A;   DIALYSIS/PERMA CATHETER INSERTION N/A 01/08/2021   Procedure: DIALYSIS/PERMA CATHETER INSERTION;  Surgeon: Marea Selinda RAMAN, MD;  Location: [facility];  Service: Cardiovascular;  Laterality: N/A;   DIALYSIS/PERMA CATHETER INSERTION N/A 03/17/2023   Procedure: DIALYSIS/PERMA CATHETER INSERTION;  Surgeon: Jama Cordella [name], MD;  Location: [facility];  Service: Cardiovascular;  Laterality: N/A;   DIALYSIS/PERMA CATHETER REMOVAL N/A 03/15/2021   Procedure: DIALYSIS/PERMA CATHETER REMOVAL;  Surgeon: Marea Selinda RAMAN, MD;  Location: [facility];  Service: Cardiovascular;  Laterality: N/A;   REVISON OF ARTERIOVENOUS FISTULA Left 12/11/2020   Procedure: ARTERIOVENOUS (AV) FISTULA CREATION ( BRACHIAL CEPHALIC );  Surgeon: Jama Cordella [name], MD;  Location: [facility];  Service: Vascular;  Laterality: Left;   TEMPORARY DIALYSIS CATHETER N/A 03/15/2023   Procedure: TEMPORARY DIALYSIS CATHETER;  Surgeon: Jama Cordella [name], MD;  Location: [facility];  Service: Cardiovascular;  Laterality: N/A;    History reviewed. No pertinent family history.  Allergies  Allergen Reactions   Chlorhexidine  Other (See Comments)    Blisters (topical)       Latest Ref Rng & Units 03/16/2023    3:07 AM 03/15/2023    3:10 PM 12/11/2020    9:20 AM  CBC  WBC 4.0 - 10.5 K/uL 9.0  8.6    Hemoglobin 13.0 - 17.0 g/dL 9.8  9.0  88.7   Hematocrit 39.0 - 52.0 % 29.6  27.5  33.0   Platelets 150 - 400 K/uL 185  185        CMP     Component Value Date/Time   NA 135 03/17/2023 1201   K 4.1 03/17/2023 1201   CL 94 (L) 03/17/2023 1201   CO2 25 03/17/2023 1201   GLUCOSE 156 (H) 03/17/2023 1201   BUN 36 (H) 03/17/2023 1201   CREATININE 12.25 (H) 03/17/2023 1201   CALCIUM  7.3 (L)  03/17/2023 1201   PROT 6.5 03/16/2023 0307   ALBUMIN 3.2 (L) 03/16/2023 0307   AST 15 03/16/2023 0307   ALT 13 03/16/2023 0307   ALKPHOS 50 03/16/2023 0307   BILITOT 0.6 03/16/2023 0307   GFRNONAA 5 (L) 03/17/2023 1201     No results found.     Assessment & Plan:   1. ESRD (end stage renal disease) (HCC) (Primary) Recommend:  At this time the patient does not have appropriate  extremity access for dialysis  Following review of images of most recent fistulogram with Dr. Jama as well as looking at the vein mapping results, patient should have a left brachial axillary AV graft inserted.  The risks, benefits and alternative therapies were reviewed in detail with the patient.  All questions were answered.  The patient agrees to proceed with surgery.   The patient will follow up with me in the office after the surgery.  2. Essential (primary) hypertension Continue antihypertensive medications as already ordered, these medications have been reviewed and there are no changes at this time.   Current Outpatient Medications on File Prior to Visit  Medication Sig Dispense Refill   Cholecalciferol  (VITAMIN D -3) 125 MCG (5000 UT) TABS Take 5,000 Units by mouth daily.     dutasteride  (AVODART ) 0.5 MG capsule TAKE ONE CAPSULE BY MOUTH DAILY 90 capsule 3   Methoxy PEG-Epoetin  Beta (MIRCERA IJ) Mircera     midodrine  (PROAMATINE ) 2.5 MG tablet Take 2.5 mg by mouth 3 (three) times daily with meals.     multivitamin (RENA-VIT) TABS tablet Take by mouth.     tamsulosin  (FLOMAX ) 0.4 MG CAPS capsule TAKE ONE CAPSULE BY MOUTH DAILY 90 capsule 3   calcium  acetate (PHOSLO ) 667 MG capsule  (Patient not taking: Reported on 01/09/2023)     No current facility-administered medications on file prior to visit.    There are no Patient Instructions on file for this visit. No follow-ups on file.   Thurston Brendlinger E Teniola Tseng, NP

## 2023-09-05 DIAGNOSIS — Z992 Dependence on renal dialysis: Secondary | ICD-10-CM | POA: Diagnosis not present

## 2023-09-05 DIAGNOSIS — N2581 Secondary hyperparathyroidism of renal origin: Secondary | ICD-10-CM | POA: Diagnosis not present

## 2023-09-05 DIAGNOSIS — N186 End stage renal disease: Secondary | ICD-10-CM | POA: Diagnosis not present

## 2023-09-07 DIAGNOSIS — N186 End stage renal disease: Secondary | ICD-10-CM | POA: Diagnosis not present

## 2023-09-07 DIAGNOSIS — Z992 Dependence on renal dialysis: Secondary | ICD-10-CM | POA: Diagnosis not present

## 2023-09-07 DIAGNOSIS — N2581 Secondary hyperparathyroidism of renal origin: Secondary | ICD-10-CM | POA: Diagnosis not present

## 2023-09-08 DIAGNOSIS — N186 End stage renal disease: Secondary | ICD-10-CM | POA: Diagnosis not present

## 2023-09-08 DIAGNOSIS — N2581 Secondary hyperparathyroidism of renal origin: Secondary | ICD-10-CM | POA: Diagnosis not present

## 2023-09-08 DIAGNOSIS — Z992 Dependence on renal dialysis: Secondary | ICD-10-CM | POA: Diagnosis not present

## 2023-09-12 DIAGNOSIS — N186 End stage renal disease: Secondary | ICD-10-CM | POA: Diagnosis not present

## 2023-09-12 DIAGNOSIS — N2581 Secondary hyperparathyroidism of renal origin: Secondary | ICD-10-CM | POA: Diagnosis not present

## 2023-09-12 DIAGNOSIS — Z992 Dependence on renal dialysis: Secondary | ICD-10-CM | POA: Diagnosis not present

## 2023-09-14 DIAGNOSIS — N186 End stage renal disease: Secondary | ICD-10-CM | POA: Diagnosis not present

## 2023-09-14 DIAGNOSIS — N2581 Secondary hyperparathyroidism of renal origin: Secondary | ICD-10-CM | POA: Diagnosis not present

## 2023-09-14 DIAGNOSIS — Z992 Dependence on renal dialysis: Secondary | ICD-10-CM | POA: Diagnosis not present

## 2023-09-16 DIAGNOSIS — N186 End stage renal disease: Secondary | ICD-10-CM | POA: Diagnosis not present

## 2023-09-16 DIAGNOSIS — Z992 Dependence on renal dialysis: Secondary | ICD-10-CM | POA: Diagnosis not present

## 2023-09-16 DIAGNOSIS — N2581 Secondary hyperparathyroidism of renal origin: Secondary | ICD-10-CM | POA: Diagnosis not present

## 2023-09-19 DIAGNOSIS — N186 End stage renal disease: Secondary | ICD-10-CM | POA: Diagnosis not present

## 2023-09-19 DIAGNOSIS — I120 Hypertensive chronic kidney disease with stage 5 chronic kidney disease or end stage renal disease: Secondary | ICD-10-CM | POA: Diagnosis not present

## 2023-09-19 DIAGNOSIS — T8249XA Other complication of vascular dialysis catheter, initial encounter: Secondary | ICD-10-CM | POA: Diagnosis not present

## 2023-09-19 DIAGNOSIS — Z4901 Encounter for fitting and adjustment of extracorporeal dialysis catheter: Secondary | ICD-10-CM | POA: Diagnosis not present

## 2023-09-19 DIAGNOSIS — D631 Anemia in chronic kidney disease: Secondary | ICD-10-CM | POA: Diagnosis not present

## 2023-09-19 DIAGNOSIS — T82828A Fibrosis of vascular prosthetic devices, implants and grafts, initial encounter: Secondary | ICD-10-CM | POA: Diagnosis not present

## 2023-09-20 DIAGNOSIS — N186 End stage renal disease: Secondary | ICD-10-CM | POA: Diagnosis not present

## 2023-09-20 DIAGNOSIS — Z992 Dependence on renal dialysis: Secondary | ICD-10-CM | POA: Diagnosis not present

## 2023-09-20 DIAGNOSIS — N2581 Secondary hyperparathyroidism of renal origin: Secondary | ICD-10-CM | POA: Diagnosis not present

## 2023-09-21 ENCOUNTER — Telehealth (INDEPENDENT_AMBULATORY_CARE_PROVIDER_SITE_OTHER): Payer: Self-pay

## 2023-09-21 DIAGNOSIS — N186 End stage renal disease: Secondary | ICD-10-CM | POA: Diagnosis not present

## 2023-09-21 DIAGNOSIS — Z992 Dependence on renal dialysis: Secondary | ICD-10-CM | POA: Diagnosis not present

## 2023-09-21 DIAGNOSIS — N2581 Secondary hyperparathyroidism of renal origin: Secondary | ICD-10-CM | POA: Diagnosis not present

## 2023-09-21 NOTE — Telephone Encounter (Signed)
Spoke with the patient and he is scheduled with Dr. Gilda Crease for a left brachial axillary graft on 10/04/23 at [facility]. Pre-op is on 09/29/23 at 1:45 pm at the [facility]. Pre-surgical instructions were discussed and will be mailed.

## 2023-09-23 DIAGNOSIS — N186 End stage renal disease: Secondary | ICD-10-CM | POA: Diagnosis not present

## 2023-09-23 DIAGNOSIS — Z992 Dependence on renal dialysis: Secondary | ICD-10-CM | POA: Diagnosis not present

## 2023-09-23 DIAGNOSIS — N2581 Secondary hyperparathyroidism of renal origin: Secondary | ICD-10-CM | POA: Diagnosis not present

## 2023-09-26 DIAGNOSIS — Z992 Dependence on renal dialysis: Secondary | ICD-10-CM | POA: Diagnosis not present

## 2023-09-26 DIAGNOSIS — N186 End stage renal disease: Secondary | ICD-10-CM | POA: Diagnosis not present

## 2023-09-26 DIAGNOSIS — N2581 Secondary hyperparathyroidism of renal origin: Secondary | ICD-10-CM | POA: Diagnosis not present

## 2023-09-28 DIAGNOSIS — N2581 Secondary hyperparathyroidism of renal origin: Secondary | ICD-10-CM | POA: Diagnosis not present

## 2023-09-28 DIAGNOSIS — N186 End stage renal disease: Secondary | ICD-10-CM | POA: Diagnosis not present

## 2023-09-28 DIAGNOSIS — Z992 Dependence on renal dialysis: Secondary | ICD-10-CM | POA: Diagnosis not present

## 2023-09-29 ENCOUNTER — Other Ambulatory Visit: Payer: Self-pay

## 2023-09-29 ENCOUNTER — Other Ambulatory Visit (INDEPENDENT_AMBULATORY_CARE_PROVIDER_SITE_OTHER): Payer: Self-pay | Admitting: Nurse Practitioner

## 2023-09-29 ENCOUNTER — Encounter
Admission: RE | Admit: 2023-09-29 | Discharge: 2023-09-29 | Disposition: A | Discharge status: Home / Self Care | Payer: Medicare HMO | Source: Ambulatory Visit | Attending: Vascular Surgery | Admitting: Vascular Surgery

## 2023-09-29 DIAGNOSIS — R9431 Abnormal electrocardiogram [ECG] [EKG]: Secondary | ICD-10-CM | POA: Diagnosis not present

## 2023-09-29 DIAGNOSIS — Z992 Dependence on renal dialysis: Secondary | ICD-10-CM | POA: Insufficient documentation

## 2023-09-29 DIAGNOSIS — Z01818 Encounter for other preprocedural examination: Secondary | ICD-10-CM | POA: Insufficient documentation

## 2023-09-29 DIAGNOSIS — N186 End stage renal disease: Secondary | ICD-10-CM | POA: Diagnosis not present

## 2023-09-29 HISTORY — DX: End stage renal disease: N18.6

## 2023-09-29 LAB — CBC WITH DIFFERENTIAL/PLATELET
Abs Immature Granulocytes: 0.11 10*3/uL — ABNORMAL HIGH (ref 0.00–0.07)
Basophils Absolute: 0.1 10*3/uL (ref 0.0–0.1)
Basophils Relative: 1 %
Eosinophils Absolute: 0.2 10*3/uL (ref 0.0–0.5)
Eosinophils Relative: 2 %
HCT: 27.2 % — ABNORMAL LOW (ref 39.0–52.0)
Hemoglobin: 8.9 g/dL — ABNORMAL LOW (ref 13.0–17.0)
Immature Granulocytes: 1 %
Lymphocytes Relative: 17 %
Lymphs Abs: 2.2 10*3/uL (ref 0.7–4.0)
MCH: 31.6 pg (ref 26.0–34.0)
MCHC: 32.7 g/dL (ref 30.0–36.0)
MCV: 96.5 fL (ref 80.0–100.0)
Monocytes Absolute: 0.9 10*3/uL (ref 0.1–1.0)
Monocytes Relative: 7 %
Neutro Abs: 9.2 10*3/uL — ABNORMAL HIGH (ref 1.7–7.7)
Neutrophils Relative %: 72 %
Platelets: 180 10*3/uL (ref 150–400)
RBC: 2.82 MIL/uL — ABNORMAL LOW (ref 4.22–5.81)
RDW: 17.8 % — ABNORMAL HIGH (ref 11.5–15.5)
WBC: 12.7 10*3/uL — ABNORMAL HIGH (ref 4.0–10.5)
nRBC: 0 % (ref 0.0–0.2)

## 2023-09-29 LAB — BASIC METABOLIC PANEL
Anion gap: 16 — ABNORMAL HIGH (ref 5–15)
BUN: 31 mg/dL — ABNORMAL HIGH (ref 6–20)
CO2: 22 mmol/L (ref 22–32)
Calcium: 8.8 mg/dL — ABNORMAL LOW (ref 8.9–10.3)
Chloride: 100 mmol/L (ref 98–111)
Creatinine, Ser: 11.82 mg/dL — ABNORMAL HIGH (ref 0.61–1.24)
GFR, Estimated: 5 mL/min — ABNORMAL LOW (ref >60)
Glucose, Bld: 129 mg/dL — ABNORMAL HIGH (ref 70–99)
Potassium: 3.5 mmol/L (ref 3.5–5.1)
Sodium: 138 mmol/L (ref 135–145)

## 2023-09-29 LAB — TYPE AND SCREEN
ABO/RH(D): O POS
Antibody Screen: NEGATIVE

## 2023-09-29 NOTE — Patient Instructions (Addendum)
Your procedure is scheduled on: Wednesday 10/04/23 Report to the Registration Desk on the 1st floor of the [facility]. To find out your arrival time, please call [phone] between 1PM - 3PM on: Tuesday 10/03/23 If your arrival time is 6:00 am, do not arrive before that time as the [facility] entrance doors do not open until 6:00 am.  REMEMBER: Instructions that are not followed completely may result in serious medical risk, up to and including death; or upon the discretion of your surgeon and anesthesiologist your surgery may need to be rescheduled.  Do not eat food or drink fluids after midnight the night before surgery.  No gum chewing or hard candies.   One week prior to surgery: Stop Anti-inflammatories (NSAIDS) such as Advil, Aleve, Ibuprofen, Motrin, Naproxen, Naprosyn and Aspirin based products such as Excedrin, Goody's Powder, BC Powder. Stop ANY OVER THE COUNTER supplements until after surgery.  You may however, continue to take Tylenol if needed for pain up until the day of surgery.   Continue taking all of your other prescription medications up until the day of surgery.  ON THE DAY OF SURGERY ONLY TAKE THESE MEDICATIONS WITH SIPS OF WATER:  tamsulosin (FLOMAX) 0.4 MG  dutasteride (AVODART) 0.5 MG    No Alcohol for 24 hours before or after surgery.  No Smoking including e-cigarettes for 24 hours before surgery.  No chewable tobacco products for at least 6 hours before surgery.  No nicotine patches on the day of surgery.  Do not use any "recreational" drugs for at least a week (preferably 2 weeks) before your surgery.  Please be advised that the combination of cocaine and anesthesia may have negative outcomes, up to and including death. If you test positive for cocaine, your surgery will be cancelled.  On the morning of surgery brush your teeth with toothpaste and water, you may rinse your mouth with mouthwash if you wish. Do not swallow any toothpaste or  mouthwash.  Use CHG Soap or wipes as directed on instruction sheet.  Do not wear jewelry, make-up, hairpins, clips or nail polish.  For welded (permanent) jewelry: bracelets, anklets, waist bands, etc.  Please have this removed prior to surgery.  If it is not removed, there is a chance that hospital personnel will need to cut it off on the day of surgery.  Do not wear lotions, powders, or perfumes.   Do not shave body hair from the neck down 48 hours before surgery.  Contact lenses, hearing aids and dentures may not be worn into surgery.  Do not bring valuables to the hospital. [facility] is not responsible for any missing/lost belongings or valuables.   Notify your doctor if there is any change in your medical condition (cold, fever, infection).  Wear comfortable clothing (specific to your surgery type) to the hospital.  After surgery, you can help prevent lung complications by doing breathing exercises.  Take deep breaths and cough every 1-2 hours. Your doctor may order a device called an Incentive Spirometer to help you take deep breaths. When coughing or sneezing, hold a pillow firmly against your incision with both hands. This is called "splinting." Doing this helps protect your incision. It also decreases belly discomfort.  If you are being admitted to the hospital overnight, leave your suitcase in the car. After surgery it may be brought to your room.  In case of increased patient census, it may be necessary for you, the patient, to continue your postoperative care in the Same  Day Surgery department.  If you are being discharged the day of surgery, you will not be allowed to drive home. You will need a responsible individual to drive you home and stay with you for 24 hours after surgery.   If you are taking public transportation, you will need to have a responsible individual with you.  Please call the Pre-admissions Testing Dept. at [phone] if you have any questions  about these instructions.  Surgery Visitation Policy:  Patients having surgery or a procedure may have two visitors.  Children under the age of 1 must have an adult with them who is not the patient.  Temporary Visitor Restrictions Due to increasing cases of flu, RSV and COVID-19: Children ages 75 and under will not be able to visit patients in [facility] hospitals under most circumstances.  Inpatient Visitation:    Visiting hours are 7 a.m. to 8 p.m. Up to four visitors are allowed at one time in a patient room. The visitors may rotate out with other people during the day.  One visitor age 46 or older may stay with the patient overnight and must be in the room by 8 p.m.

## 2023-09-30 DIAGNOSIS — Z992 Dependence on renal dialysis: Secondary | ICD-10-CM | POA: Diagnosis not present

## 2023-09-30 DIAGNOSIS — N186 End stage renal disease: Secondary | ICD-10-CM | POA: Diagnosis not present

## 2023-09-30 DIAGNOSIS — N2581 Secondary hyperparathyroidism of renal origin: Secondary | ICD-10-CM | POA: Diagnosis not present

## 2023-10-03 DIAGNOSIS — N186 End stage renal disease: Secondary | ICD-10-CM | POA: Diagnosis not present

## 2023-10-03 DIAGNOSIS — Z992 Dependence on renal dialysis: Secondary | ICD-10-CM | POA: Diagnosis not present

## 2023-10-03 DIAGNOSIS — N2581 Secondary hyperparathyroidism of renal origin: Secondary | ICD-10-CM | POA: Diagnosis not present

## 2023-10-04 ENCOUNTER — Other Ambulatory Visit: Payer: Self-pay

## 2023-10-04 ENCOUNTER — Ambulatory Visit
Admission: RE | Admit: 2023-10-04 | Discharge: 2023-10-04 | Disposition: A | Discharge status: Home / Self Care | Payer: Medicare HMO | Source: Ambulatory Visit | Attending: Vascular Surgery | Admitting: Vascular Surgery

## 2023-10-04 ENCOUNTER — Encounter
Admission: RE | Disposition: A | Discharge status: Home / Self Care | Payer: Self-pay | Source: Ambulatory Visit | Attending: Vascular Surgery

## 2023-10-04 ENCOUNTER — Ambulatory Visit: Payer: Self-pay | Admitting: Urgent Care

## 2023-10-04 ENCOUNTER — Ambulatory Visit: Payer: Medicare HMO | Admitting: Certified Registered"

## 2023-10-04 ENCOUNTER — Encounter: Payer: Self-pay | Admitting: Vascular Surgery

## 2023-10-04 DIAGNOSIS — Z6841 Body Mass Index (BMI) 40.0 and over, adult: Secondary | ICD-10-CM | POA: Insufficient documentation

## 2023-10-04 DIAGNOSIS — F429 Obsessive-compulsive disorder, unspecified: Secondary | ICD-10-CM | POA: Diagnosis not present

## 2023-10-04 DIAGNOSIS — Z79899 Other long term (current) drug therapy: Secondary | ICD-10-CM | POA: Diagnosis not present

## 2023-10-04 DIAGNOSIS — N186 End stage renal disease: Secondary | ICD-10-CM | POA: Insufficient documentation

## 2023-10-04 DIAGNOSIS — E669 Obesity, unspecified: Secondary | ICD-10-CM | POA: Insufficient documentation

## 2023-10-04 DIAGNOSIS — R519 Headache, unspecified: Secondary | ICD-10-CM | POA: Diagnosis not present

## 2023-10-04 DIAGNOSIS — F840 Autistic disorder: Secondary | ICD-10-CM | POA: Insufficient documentation

## 2023-10-04 DIAGNOSIS — Z992 Dependence on renal dialysis: Secondary | ICD-10-CM | POA: Insufficient documentation

## 2023-10-04 DIAGNOSIS — F419 Anxiety disorder, unspecified: Secondary | ICD-10-CM | POA: Diagnosis not present

## 2023-10-04 DIAGNOSIS — I120 Hypertensive chronic kidney disease with stage 5 chronic kidney disease or end stage renal disease: Secondary | ICD-10-CM | POA: Diagnosis not present

## 2023-10-04 HISTORY — PX: AV FISTULA PLACEMENT: SHX1204

## 2023-10-04 LAB — POCT I-STAT, CHEM 8
BUN: 41 mg/dL — ABNORMAL HIGH (ref 6–20)
Calcium, Ion: 0.91 mmol/L — ABNORMAL LOW (ref 1.15–1.40)
Chloride: 101 mmol/L (ref 98–111)
Creatinine, Ser: >18 mg/dL — ABNORMAL HIGH (ref 0.61–1.24)
Glucose, Bld: 88 mg/dL (ref 70–99)
HCT: 27 % — ABNORMAL LOW (ref 39.0–52.0)
Hemoglobin: 9.2 g/dL — ABNORMAL LOW (ref 13.0–17.0)
Potassium: 4.5 mmol/L (ref 3.5–5.1)
Sodium: 139 mmol/L (ref 135–145)
TCO2: 24 mmol/L (ref 22–32)

## 2023-10-04 SURGERY — INSERTION OF ARTERIOVENOUS (AV) GORE-TEX GRAFT ARM
Anesthesia: General | Site: Arm Upper | Laterality: Left

## 2023-10-04 MED ORDER — HYDROCODONE-ACETAMINOPHEN 5-325 MG PO TABS: 1.0000 | ORAL_TABLET | Freq: Four times a day (QID) | ORAL | 0 refills | Status: DC | PRN

## 2023-10-04 MED ORDER — ONDANSETRON HCL 4 MG/2ML IJ SOLN: 4.0000 mg | Freq: Once | INTRAMUSCULAR | Status: DC | PRN

## 2023-10-04 MED ORDER — ROCURONIUM BROMIDE 100 MG/10ML IV SOLN
INTRAVENOUS | Status: DC | PRN
  Administered 2023-10-04: 60 mg via INTRAVENOUS
  Administered 2023-10-04: 20 mg via INTRAVENOUS

## 2023-10-04 MED ORDER — MIDAZOLAM HCL 2 MG/2ML IJ SOLN
INTRAMUSCULAR | Status: AC
Start: 2023-10-04 — End: ?
  Filled 2023-10-04: qty 2

## 2023-10-04 MED ORDER — DEXAMETHASONE SODIUM PHOSPHATE 10 MG/ML IJ SOLN
INTRAMUSCULAR | Status: DC | PRN
  Administered 2023-10-04: 5 mg via INTRAVENOUS

## 2023-10-04 MED ORDER — LIDOCAINE HCL (CARDIAC) PF 100 MG/5ML IV SOSY
PREFILLED_SYRINGE | INTRAVENOUS | Status: DC | PRN
  Administered 2023-10-04: 100 mg via INTRAVENOUS

## 2023-10-04 MED ORDER — CEFAZOLIN SODIUM-DEXTROSE 2-4 GM/100ML-% IV SOLN
2.0000 g | INTRAVENOUS | Status: AC
  Administered 2023-10-04: 2 g via INTRAVENOUS

## 2023-10-04 MED ORDER — SUCCINYLCHOLINE CHLORIDE 200 MG/10ML IV SOSY
PREFILLED_SYRINGE | INTRAVENOUS | Status: DC | PRN
  Administered 2023-10-04: 100 mg via INTRAVENOUS

## 2023-10-04 MED ORDER — ONDANSETRON HCL 4 MG/2ML IJ SOLN
INTRAMUSCULAR | Status: DC | PRN
  Administered 2023-10-04 (×2): 4 mg via INTRAVENOUS

## 2023-10-04 MED ORDER — GLYCOPYRROLATE 0.2 MG/ML IJ SOLN
INTRAMUSCULAR | Status: DC | PRN
  Administered 2023-10-04: .2 mg via INTRAVENOUS

## 2023-10-04 MED ORDER — OXYCODONE HCL 5 MG/5ML PO SOLN: 5.0000 mg | Freq: Once | ORAL | Status: AC | PRN

## 2023-10-04 MED ORDER — ONDANSETRON HCL 4 MG/2ML IJ SOLN: 4.0000 mg | Freq: Four times a day (QID) | INTRAMUSCULAR | Status: DC | PRN

## 2023-10-04 MED ORDER — HEMOSTATIC AGENTS (NO CHARGE) OPTIME
TOPICAL | Status: DC | PRN
  Administered 2023-10-04: 1 via TOPICAL

## 2023-10-04 MED ORDER — PHENYLEPHRINE 80 MCG/ML (10ML) SYRINGE FOR IV PUSH (FOR BLOOD PRESSURE SUPPORT)
PREFILLED_SYRINGE | INTRAVENOUS | Status: DC | PRN
  Administered 2023-10-04 (×3): 160 ug via INTRAVENOUS
  Administered 2023-10-04: 80 ug via INTRAVENOUS
  Administered 2023-10-04: 160 ug via INTRAVENOUS

## 2023-10-04 MED ORDER — CEFAZOLIN SODIUM-DEXTROSE 2-4 GM/100ML-% IV SOLN
INTRAVENOUS | Status: AC
  Filled 2023-10-04: qty 100

## 2023-10-04 MED ORDER — OXYCODONE HCL 5 MG PO TABS
5.0000 mg | ORAL_TABLET | Freq: Once | ORAL | Status: AC | PRN
  Administered 2023-10-04: 5 mg via ORAL

## 2023-10-04 MED ORDER — FENTANYL CITRATE (PF) 100 MCG/2ML IJ SOLN
INTRAMUSCULAR | Status: DC | PRN
  Administered 2023-10-04: 50 ug via INTRAVENOUS

## 2023-10-04 MED ORDER — ACETAMINOPHEN 10 MG/ML IV SOLN: 1000.0000 mg | Freq: Once | INTRAVENOUS | Status: DC | PRN

## 2023-10-04 MED ORDER — HYDROMORPHONE HCL 1 MG/ML IJ SOLN: 1.0000 mg | Freq: Once | INTRAMUSCULAR | Status: DC | PRN

## 2023-10-04 MED ORDER — PROPOFOL 10 MG/ML IV BOLUS
INTRAVENOUS | Status: DC | PRN
  Administered 2023-10-04: 150 mg via INTRAVENOUS

## 2023-10-04 MED ORDER — OXYCODONE HCL 5 MG PO TABS
ORAL_TABLET | ORAL | Status: AC
  Filled 2023-10-04: qty 1

## 2023-10-04 MED ORDER — PHENYLEPHRINE HCL-NACL 20-0.9 MG/250ML-% IV SOLN
INTRAVENOUS | Status: DC | PRN
  Administered 2023-10-04: 15 ug/min via INTRAVENOUS

## 2023-10-04 MED ORDER — SUGAMMADEX SODIUM 200 MG/2ML IV SOLN
INTRAVENOUS | Status: DC | PRN
  Administered 2023-10-04: 220 mg via INTRAVENOUS

## 2023-10-04 MED ORDER — CHLORHEXIDINE GLUCONATE 0.12 % MT SOLN
OROMUCOSAL | Status: AC
  Filled 2023-10-04: qty 15

## 2023-10-04 MED ORDER — FENTANYL CITRATE (PF) 100 MCG/2ML IJ SOLN
25.0000 ug | INTRAMUSCULAR | Status: DC | PRN
Start: 2023-10-04 — End: 2023-10-04

## 2023-10-04 MED ORDER — HEPARIN SODIUM (PORCINE) 5000 UNIT/ML IJ SOLN
INTRAMUSCULAR | Status: AC
  Filled 2023-10-04: qty 1

## 2023-10-04 MED ORDER — FENTANYL CITRATE (PF) 100 MCG/2ML IJ SOLN
INTRAMUSCULAR | Status: AC
  Filled 2023-10-04: qty 2

## 2023-10-04 MED ORDER — 0.9 % SODIUM CHLORIDE (POUR BTL) OPTIME
TOPICAL | Status: DC | PRN
  Administered 2023-10-04: 500 mL

## 2023-10-04 MED ORDER — EPHEDRINE SULFATE-NACL 50-0.9 MG/10ML-% IV SOSY
PREFILLED_SYRINGE | INTRAVENOUS | Status: DC | PRN
  Administered 2023-10-04: 5 mg via INTRAVENOUS

## 2023-10-04 MED ORDER — BUPIVACAINE LIPOSOME 1.3 % IJ SUSP
INTRAMUSCULAR | Status: AC
  Filled 2023-10-04: qty 20

## 2023-10-04 MED ORDER — BUPIVACAINE HCL (PF) 0.5 % IJ SOLN
INTRAMUSCULAR | Status: AC
  Filled 2023-10-04: qty 30

## 2023-10-04 MED ORDER — SODIUM CHLORIDE 0.9 % IV SOLN
INTRAVENOUS | Status: DC
Start: 2023-10-04 — End: 2023-10-04

## 2023-10-04 MED ORDER — VASOPRESSIN 20 UNIT/ML IV SOLN
INTRAVENOUS | Status: DC | PRN
  Administered 2023-10-04 (×5): 2 [IU] via INTRAVENOUS

## 2023-10-04 SURGICAL SUPPLY — 51 items
APPLIER CLIP 11 MED OPEN (CLIP)
APPLIER CLIP 9.375 SM OPEN (CLIP)
BAG DECANTER FOR FLEXI CONT (MISCELLANEOUS) ×1 IMPLANT
BLADE CLIPPER SURG (BLADE) IMPLANT
BLADE SURG SZ11 CARB STEEL (BLADE) ×1 IMPLANT
BNDG COHESIVE 4X5 TAN STRL LF (GAUZE/BANDAGES/DRESSINGS) IMPLANT
BRUSH SCRUB EZ 4% CHG (MISCELLANEOUS) ×1 IMPLANT
CHLORAPREP W/TINT 26 (MISCELLANEOUS) ×1 IMPLANT
CLAMP SUTURE YELLOW 5 PAIRS (MISCELLANEOUS) ×1 IMPLANT
CLIP APPLIE 11 MED OPEN (CLIP) IMPLANT
CLIP APPLIE 9.375 SM OPEN (CLIP) IMPLANT
DERMABOND ADVANCED .7 DNX12 (GAUZE/BANDAGES/DRESSINGS) ×1 IMPLANT
DRESSING SURGICEL FIBRLLR 1X2 (HEMOSTASIS) ×1 IMPLANT
DRSG SURGICEL FIBRILLAR 1X2 (HEMOSTASIS)
ELECT CAUTERY BLADE 6.4 (BLADE) ×1 IMPLANT
ELECT REM PT RETURN 9FT ADLT (ELECTROSURGICAL) ×1
ELECTRODE REM PT RTRN 9FT ADLT (ELECTROSURGICAL) ×1 IMPLANT
GLOVE BIO SURGEON STRL SZ7 (GLOVE) ×1 IMPLANT
GLOVE SURG SYN 8.0 (GLOVE) ×1
GLOVE SURG SYN 8.0 PF PI (GLOVE) ×1 IMPLANT
GOWN STRL REUS W/ TWL LRG LVL3 (GOWN DISPOSABLE) ×2 IMPLANT
GOWN STRL REUS W/ TWL XL LVL3 (GOWN DISPOSABLE) ×1 IMPLANT
GRAFT PROPATEN STD WALL 4 7X45 (Vascular Products) ×1 IMPLANT
IV NS 500ML BAXH (IV SOLUTION) ×1 IMPLANT
KIT TURNOVER KIT A (KITS) ×1 IMPLANT
LABEL OR SOLS (LABEL) ×1 IMPLANT
LOOP VESSEL MAXI 1X406 RED (MISCELLANEOUS) ×1 IMPLANT
LOOP VESSEL MINI 0.8X406 BLUE (MISCELLANEOUS) ×2 IMPLANT
MANIFOLD NEPTUNE II (INSTRUMENTS) ×1 IMPLANT
NDL FILTER BLUNT 18X1 1/2 (NEEDLE) ×1 IMPLANT
NEEDLE FILTER BLUNT 18X1 1/2 (NEEDLE) ×1
NS IRRIG 500ML POUR BTL (IV SOLUTION) ×1 IMPLANT
PACK EXTREMITY ARMC (MISCELLANEOUS) ×1 IMPLANT
PAD PREP OB/GYN DISP 24X41 (PERSONAL CARE ITEMS) ×1 IMPLANT
SOL PREP PVP 2OZ (MISCELLANEOUS) ×1
SOLUTION PREP PVP 2OZ (MISCELLANEOUS) IMPLANT
STOCKINETTE 48X4 2 PLY STRL (GAUZE/BANDAGES/DRESSINGS) ×1 IMPLANT
STOCKINETTE STRL 4IN 9604848 (GAUZE/BANDAGES/DRESSINGS) ×1
SUT GTX CV-6 30 (SUTURE) ×2 IMPLANT
SUT MNCRL+ 5-0 UNDYED PC-3 (SUTURE) ×1 IMPLANT
SUT PROLENE 6 0 BV (SUTURE) ×2 IMPLANT
SUT SILK 2 0 SH (SUTURE) ×1 IMPLANT
SUT SILK 2-0 18XBRD TIE 12 (SUTURE) ×1 IMPLANT
SUT SILK 3-0 18XBRD TIE 12 (SUTURE) ×1 IMPLANT
SUT SILK 4-0 18XBRD TIE 12 (SUTURE) ×1 IMPLANT
SUT VIC AB 3-0 SH 27X BRD (SUTURE) ×2 IMPLANT
SYR 20ML LL LF (SYRINGE) ×1 IMPLANT
SYR 3ML LL SCALE MARK (SYRINGE) ×1 IMPLANT
TAG SUTURE CLAMP YLW 5PR (MISCELLANEOUS) ×1
TRAP FLUID SMOKE EVACUATOR (MISCELLANEOUS) ×1 IMPLANT
WATER STERILE IRR 500ML POUR (IV SOLUTION) ×1 IMPLANT

## 2023-10-04 NOTE — Anesthesia Procedure Notes (Signed)
 Procedure Name: Intubation Date/Time: 10/04/2023 3:15 PM  Performed by: Ledora Duncan, CRNAPre-anesthesia Checklist: Patient identified, Emergency Drugs available, Suction available and Patient being monitored Patient Re-evaluated:Patient Re-evaluated prior to induction Oxygen Delivery Method: Circle system utilized Preoxygenation: Pre-oxygenation with 100% oxygen Induction Type: IV induction Ventilation: Mask ventilation without difficulty Laryngoscope Size: McGrath and 3 Grade View: Grade I Tube type: Oral Tube size: 6.5 mm Number of attempts: 1 Airway Equipment and Method: Stylet and Oral airway Placement Confirmation: ETT inserted through vocal cords under direct vision, positive ETCO2 and breath sounds checked- equal and bilateral Secured at: 21 cm Tube secured with: Tape Dental Injury: Teeth and Oropharynx as per pre-operative assessment

## 2023-10-04 NOTE — Transfer of Care (Signed)
 Immediate Anesthesia Transfer of Care Note  Patient: Joseph Gilmore  Procedure(s) Performed: INSERTION OF ARTERIOVENOUS (AV) GORE-TEX GRAFT ARM (BRACHIAL AXILLARY) (Left: Arm Upper)  Patient Location: PACU  Anesthesia Type:General  Level of Consciousness: awake, drowsy, and patient cooperative  Airway & Oxygen Therapy: Patient Spontanous Breathing and Patient connected to face mask oxygen  Post-op Assessment: Report given to RN and Post -op Vital signs reviewed and stable  Post vital signs: Reviewed and stable  Last Vitals:  Vitals Value Taken Time  BP 85/67 10/04/23 1726  Temp 36.1 C 10/04/23 1726  Pulse 101 10/04/23 1737  Resp 17 10/04/23 1737  SpO2 100 % 10/04/23 1737  Vitals shown include unfiled device data.  Last Pain:  Vitals:   10/04/23 1726  TempSrc:   PainSc: 0-No pain         Complications: No notable events documented.

## 2023-10-04 NOTE — Interval H&P Note (Signed)
 History and Physical Interval Note:  10/04/2023 2:57 PM  Joseph Gilmore  has presented today for surgery, with the diagnosis of ESRD.  The various methods of treatment have been discussed with the patient and family. After consideration of risks, benefits and other options for treatment, the patient has consented to  Procedure(s): INSERTION OF ARTERIOVENOUS (AV) GORE-TEX GRAFT ARM (BRACHIAL AXILLARY) (Left) as a surgical intervention.  The patient's history has been reviewed, patient examined, no change in status, stable for surgery.  I have reviewed the patient's chart and labs.  Questions were answered to the patient's satisfaction.     Cordella Shawl

## 2023-10-04 NOTE — Anesthesia Preprocedure Evaluation (Signed)
 Anesthesia Evaluation  Patient identified by MRN, date of birth, ID band Patient awake    Reviewed: Allergy & Precautions, NPO status , Patient's Chart, lab work & pertinent test results  History of Anesthesia Complications Negative for: history of anesthetic complications  Airway Mallampati: III  TM Distance: >3 FB Neck ROM: Full    Dental no notable dental hx. (+) Teeth Intact   Pulmonary neg pulmonary ROS, neg sleep apnea, neg COPD, Patient abstained from smoking.Not current smoker   Pulmonary exam normal breath sounds clear to auscultation       Cardiovascular Exercise Tolerance: Good METShypertension, Pt. on medications (-) CAD, (-) Past MI and (-) CHF (-) dysrhythmias + Valvular Problems/Murmurs (as a child )  Rhythm:Regular Rate:Normal - Systolic murmurs    Neuro/Psych  Headaches, neg Seizures PSYCHIATRIC DISORDERS Anxiety     Mental retardation  Neuromuscular disease    GI/Hepatic Neg liver ROS,neg GERD  ,,  Endo/Other  neg diabetes  Class 3 obesity  Renal/GU ESRF and DialysisRenal diseaseLast dialyzed yesterday     Musculoskeletal   Abdominal  (+) + obese  Peds  Hematology  (+) Blood dyscrasia, anemia   Anesthesia Other Findings Past Medical History: No date: Autism No date: Chronic kidney disease No date: ESRD (end stage renal disease) (HCC) No date: Hypertension No date: Murmur No date: OCD (obsessive compulsive disorder)  Reproductive/Obstetrics                             Anesthesia Physical Anesthesia Plan  ASA: 4  Anesthesia Plan: General   Post-op Pain Management: Ofirmev  IV (intra-op)*   Induction: Intravenous  PONV Risk Score and Plan: 2 and Ondansetron , Dexamethasone  and Midazolam   Airway Management Planned: LMA  Additional Equipment: None  Intra-op Plan:   Post-operative Plan: Extubation in OR  Informed Consent: I have reviewed the patients History  and Physical, chart, labs and discussed the procedure including the risks, benefits and alternatives for the proposed anesthesia with the patient or authorized representative who has indicated his/her understanding and acceptance.     Dental advisory given and Consent reviewed with POA  Plan Discussed with: CRNA and Surgeon  Anesthesia Plan Comments: (Discussed risks of anesthesia with patient, as well as his father at bedside (patient is autistic), including PONV, sore throat, lip/dental/eye damage. Rare risks discussed as well, such as cardiorespiratory and neurological sequelae, and allergic reactions. Discussed the role of CRNA in patient's perioperative care. Patient and father understands.)        Anesthesia Quick Evaluation

## 2023-10-04 NOTE — Op Note (Addendum)
 OPERATIVE NOTE   PROCEDURE: left brachial axillary arteriovenous graft placement  PRE-OPERATIVE DIAGNOSIS: End Stage Renal Disease  POST-OPERATIVE DIAGNOSIS: End Stage Renal Disease  SURGEON: Cordella Shawl  ASSISTANT(S): none  ANESTHESIA: general  ESTIMATED BLOOD LOSS: <50 cc  FINDING(S): 3 mm brachial artery with a high radial artery takeoff.  6 mm axillary vein  SPECIMEN(S):  none  INDICATIONS:   Joseph Gilmore is a 53 y.o. male who presents with end stage renal disease.  The patient is scheduled for left brachial axillary AV graft placement.  The patient is aware the risks include but are not limited to: bleeding, infection, steal syndrome, nerve damage, ischemic monomelic neuropathy, failure to mature, and need for additional procedures.  The patient is aware of the risks of the procedure and elects to proceed forward.  DESCRIPTION: After full informed written consent was obtained from the patient, the patient was brought back to the operating room and placed supine upon the operating table.  Prior to induction, the patient received IV antibiotics.   After obtaining adequate anesthesia, the patient was then prepped and draped in the standard fashion for a left arm access procedure.   A linear incision was then created along the medial border of the biceps muscle just proximal to the antecubital crease and the brachial artery which was exposed through. The brachial artery was then looped proximally and distally with Silastic Vesseloops. Side branches were controlled with 4-0 silk ties.  Attention was then turned to the exposure of the axillary vein. Linear incision was then created medial to the proximal portion of the biceps at the level of the anterior axillary crease. The axillary vein was exposed and again looped proximally and distally with Silastic vessel loops. Associated tributaries were also controlled with Silastic Vesseloops.  The Gore tunneler was then  delivered onto the field and a subcutaneous path was made from the arterial incision to the venous incision. A 4-7 tapered PTFE propatent graft by Lazaro was then pulled through the subcutaneous tunnel. The arterial 4 mm portion was then approximated to the brachial artery. Brachial artery was controlled proximally and distally with the Silastic Vesseloops. Arteriotomy was made with an 11 blade scalpel and extended with Potts scissors and a 6-0 Prolene stay suture was placed. End graft to side brachial artery anastomosis was then fashioned with running CV 6 suture. Flushing maneuvers were performed suture line was hemostatic and the graft was then assessed for proper position and ease of future cannulation. Heparinized saline was infused into the vein and the graft was clamped with a vascular clamp. With the graft pressurized it was approximated to the axillary vein in its native bed and then marked with a surgical marker. The vein was then delivered into the surgical field and controlled with the Silastic vessel loops. Venotomy was then made with an 11 blade scalpel and extended with Potts scissors and a 6-0 Prolene suture was used as stay suture. The the graft was then sewn to the vein in an end graft to side vein fashion using running CV 6 suture.  Flushing maneuvers were performed and the artery was allowed to forward and back bleed.  Flow was then established through the AV graft  There was good  thrill in the venous outflow, and there was 1+ palpable radial pulse.  At this point, I irrigated out the surgical wounds.  There was no further active bleeding.  The subcutaneous tissue was reapproximated with a running stitch of 3-0  Vicryl.  The skin was then reapproximated with a running subcuticular stitch of 4-0 Vicryl.  The skin was then cleaned, dried, and reinforced with Dermabond.    The patient tolerated this procedure well.   COMPLICATIONS: None  CONDITION: Met[facility] Cordella Shawl [facility]  Office: [phone]   10/04/2023, 5:25 PM

## 2023-10-05 ENCOUNTER — Encounter: Payer: Self-pay | Admitting: Vascular Surgery

## 2023-10-05 DIAGNOSIS — Z992 Dependence on renal dialysis: Secondary | ICD-10-CM | POA: Diagnosis not present

## 2023-10-05 DIAGNOSIS — N186 End stage renal disease: Secondary | ICD-10-CM | POA: Diagnosis not present

## 2023-10-05 DIAGNOSIS — N2581 Secondary hyperparathyroidism of renal origin: Secondary | ICD-10-CM | POA: Diagnosis not present

## 2023-10-07 DIAGNOSIS — Z992 Dependence on renal dialysis: Secondary | ICD-10-CM | POA: Diagnosis not present

## 2023-10-07 DIAGNOSIS — N186 End stage renal disease: Secondary | ICD-10-CM | POA: Diagnosis not present

## 2023-10-07 DIAGNOSIS — N2581 Secondary hyperparathyroidism of renal origin: Secondary | ICD-10-CM | POA: Diagnosis not present

## 2023-10-10 DIAGNOSIS — N2581 Secondary hyperparathyroidism of renal origin: Secondary | ICD-10-CM | POA: Diagnosis not present

## 2023-10-10 DIAGNOSIS — Z992 Dependence on renal dialysis: Secondary | ICD-10-CM | POA: Diagnosis not present

## 2023-10-10 DIAGNOSIS — N186 End stage renal disease: Secondary | ICD-10-CM | POA: Diagnosis not present

## 2023-10-11 NOTE — Anesthesia Postprocedure Evaluation (Signed)
 Anesthesia Post Note  Patient: Joseph Gilmore  Procedure(s) Performed: INSERTION OF ARTERIOVENOUS (AV) GORE-TEX GRAFT ARM (BRACHIAL AXILLARY) (Left: Arm Upper)  Patient location during evaluation: PACU Anesthesia Type: General Level of consciousness: awake and alert Pain management: pain level controlled Vital Signs Assessment: post-procedure vital signs reviewed and stable Respiratory status: spontaneous breathing, nonlabored ventilation, respiratory function stable and patient connected to nasal cannula oxygen Cardiovascular status: blood pressure returned to baseline and stable Postop Assessment: no apparent nausea or vomiting Anesthetic complications: no   No notable events documented.   Last Vitals:  Vitals:   10/04/23 1800 10/04/23 1812  BP: (!) 97/53 (!) 90/46  Pulse: 100 86  Resp: 18 20  Temp: (!) 36.1 C (!) 36.2 C  SpO2: 100% 99%    Last Pain:  Vitals:   10/05/23 0836  TempSrc:   PainSc: 0-No pain                 Prentice Murphy

## 2023-10-12 DIAGNOSIS — N186 End stage renal disease: Secondary | ICD-10-CM | POA: Diagnosis not present

## 2023-10-12 DIAGNOSIS — N2581 Secondary hyperparathyroidism of renal origin: Secondary | ICD-10-CM | POA: Diagnosis not present

## 2023-10-12 DIAGNOSIS — Z992 Dependence on renal dialysis: Secondary | ICD-10-CM | POA: Diagnosis not present

## 2023-10-14 DIAGNOSIS — Z992 Dependence on renal dialysis: Secondary | ICD-10-CM | POA: Diagnosis not present

## 2023-10-14 DIAGNOSIS — N2581 Secondary hyperparathyroidism of renal origin: Secondary | ICD-10-CM | POA: Diagnosis not present

## 2023-10-14 DIAGNOSIS — N186 End stage renal disease: Secondary | ICD-10-CM | POA: Diagnosis not present

## 2023-10-16 ENCOUNTER — Ambulatory Visit: Payer: Medicare HMO | Admitting: Internal Medicine

## 2023-10-16 ENCOUNTER — Ambulatory Visit (INDEPENDENT_AMBULATORY_CARE_PROVIDER_SITE_OTHER): Payer: Medicare HMO | Admitting: Internal Medicine

## 2023-10-16 VITALS — BP 118/70 | HR 100 | Temp 98.7°F | Ht 68.0 in | Wt 265.0 lb

## 2023-10-16 DIAGNOSIS — Z6832 Body mass index (BMI) 32.0-32.9, adult: Secondary | ICD-10-CM

## 2023-10-16 DIAGNOSIS — E6609 Other obesity due to excess calories: Secondary | ICD-10-CM | POA: Diagnosis not present

## 2023-10-16 DIAGNOSIS — I10 Essential (primary) hypertension: Secondary | ICD-10-CM | POA: Diagnosis not present

## 2023-10-16 DIAGNOSIS — J208 Acute bronchitis due to other specified organisms: Secondary | ICD-10-CM | POA: Diagnosis not present

## 2023-10-16 DIAGNOSIS — E66811 Obesity, class 1: Secondary | ICD-10-CM | POA: Diagnosis not present

## 2023-10-16 MED ORDER — PREDNISONE 20 MG PO TABS
40.0000 mg | ORAL_TABLET | Freq: Every day | ORAL | 0 refills | Status: AC
Start: 2023-10-16 — End: 2023-10-21

## 2023-10-16 MED ORDER — DOXYCYCLINE HYCLATE 100 MG PO TABS
100.0000 mg | ORAL_TABLET | Freq: Two times a day (BID) | ORAL | 0 refills | Status: AC
Start: 2023-10-16 — End: 2023-10-21

## 2023-10-16 NOTE — Progress Notes (Signed)
Established Patient Office Visit  Subjective:  Patient ID: Joseph Gilmore, male    DOB: 03-Oct-1970  Age: 53 y.o. MRN: [id number]  Chief Complaint  Patient presents with   Follow-up    3 month follow up  BP check    No new complaints, here for lab review and medication refills. C/o cough with scant sputum but denies any shortness of breath.    No other concerns at this time.   Past Medical History:  Diagnosis Date   Autism    Chronic kidney disease    ESRD (end stage renal disease) (HCC)    Hypertension    Murmur    OCD (obsessive compulsive disorder)     Past Surgical History:  Procedure Laterality Date   A/V FISTULAGRAM Left 10/07/2020   Procedure: A/V FISTULAGRAM;  Surgeon: Renford Dills, MD;  Location: [facility];  Service: Cardiovascular;  Laterality: Left;   A/V FISTULAGRAM Left 02/15/2023   Procedure: A/V Fistulagram;  Surgeon: Renford Dills, MD;  Location: [facility];  Service: Cardiovascular;  Laterality: Left;   AV FISTULA PLACEMENT Left 07/15/2020   Procedure: ARTERIOVENOUS FISTULA CREATION;  Surgeon: Renford Dills, MD;  Location: [facility];  Service: Vascular;  Laterality: Left;   AV FISTULA PLACEMENT Left 10/04/2023   Procedure: INSERTION OF ARTERIOVENOUS (AV) GORE-TEX GRAFT ARM (BRACHIAL AXILLARY);  Surgeon: Renford Dills, MD;  Location: [facility];  Service: Vascular;  Laterality: Left;   DIALYSIS/PERMA CATHETER INSERTION N/A 04/06/2020   Procedure: DIALYSIS/PERMA CATHETER INSERTION;  Surgeon: Annice Needy, MD;  Location: [facility];  Service: Cardiovascular;  Laterality: N/A;   DIALYSIS/PERMA CATHETER INSERTION N/A 05/26/2020   Procedure: DIALYSIS/PERMA CATHETER INSERTION;  Surgeon: Renford Dills, MD;  Location: [facility];  Service: Cardiovascular;  Laterality: N/A;   DIALYSIS/PERMA CATHETER INSERTION N/A 07/09/2020   Procedure: DIALYSIS/PERMA CATHETER INSERTION;  Surgeon: Annice Needy, MD;   Location: [facility];  Service: Cardiovascular;  Laterality: N/A;   DIALYSIS/PERMA CATHETER INSERTION N/A 01/08/2021   Procedure: DIALYSIS/PERMA CATHETER INSERTION;  Surgeon: Annice Needy, MD;  Location: [facility];  Service: Cardiovascular;  Laterality: N/A;   DIALYSIS/PERMA CATHETER INSERTION N/A 03/17/2023   Procedure: DIALYSIS/PERMA CATHETER INSERTION;  Surgeon: Renford Dills, MD;  Location: [facility];  Service: Cardiovascular;  Laterality: N/A;   DIALYSIS/PERMA CATHETER REMOVAL N/A 03/15/2021   Procedure: DIALYSIS/PERMA CATHETER REMOVAL;  Surgeon: Annice Needy, MD;  Location: [facility];  Service: Cardiovascular;  Laterality: N/A;   REVISON OF ARTERIOVENOUS FISTULA Left 12/11/2020   Procedure: ARTERIOVENOUS (AV) FISTULA CREATION ( BRACHIAL CEPHALIC );  Surgeon: Renford Dills, MD;  Location: [facility];  Service: Vascular;  Laterality: Left;   TEMPORARY DIALYSIS CATHETER N/A 03/15/2023   Procedure: TEMPORARY DIALYSIS CATHETER;  Surgeon: Renford Dills, MD;  Location: [facility];  Service: Cardiovascular;  Laterality: N/A;    Social History   Socioeconomic History   Marital status: Single    Spouse name: Not on file   Number of children: Not on file   Years of education: Not on file   Highest education level: Not on file  Occupational History   Not on file  Tobacco Use   Smoking status: Never    Passive exposure: Never   Smokeless tobacco: Never  Vaping Use   Vaping status: Never Used  Substance and Sexual Activity   Alcohol use: Never  Drug use: Never   Sexual activity: Not Currently  Other Topics Concern   Not on file  Social History Narrative   Lives with father .   Social Drivers of Corporate investment banker Strain: Not on file  Food Insecurity: No Food Insecurity (05/10/2022)   Hunger Vital Sign    Worried About Running Out of Food in the Last Year: Never true    Ran Out of Food in the Last Year: Never  true  Transportation Needs: No Transportation Needs (05/10/2022)   PRAPARE - Administrator, Civil Service (Medical): No    Lack of Transportation (Non-Medical): No  Physical Activity: Not on file  Stress: Not on file  Social Connections: Not on file  Intimate Partner Violence: Not on file    No family history on file.  Allergies  Allergen Reactions   Chlorhexidine Other (See Comments)    Blisters (topical)    Outpatient Medications Prior to Visit  Medication Sig   Cholecalciferol (VITAMIN D-3) 125 MCG (5000 UT) TABS Take 5,000 Units by mouth daily.   dutasteride (AVODART) 0.5 MG capsule TAKE ONE CAPSULE BY MOUTH DAILY   HYDROcodone-acetaminophen (NORCO) 5-325 MG tablet Take 1-2 tablets by mouth every 6 (six) hours as needed for moderate pain (pain score 4-6) or severe pain (pain score 7-10).   Methoxy PEG-Epoetin Beta (MIRCERA IJ) Mircera (Patient not taking: Reported on 09/29/2023)   midodrine (PROAMATINE) 2.5 MG tablet Take 2.5 mg by mouth See admin instructions. Take 2.5 mg by mouth on Tuesday, Thursday and Saturday at dialysis as needed for low blood pressure.   multivitamin (RENA-VIT) TABS tablet Take by mouth.   tamsulosin (FLOMAX) 0.4 MG CAPS capsule TAKE ONE CAPSULE BY MOUTH DAILY   No facility-administered medications prior to visit.    Review of Systems  Constitutional: Negative.   HENT: Negative.    Eyes: Negative.   Respiratory: Negative.    Cardiovascular: Negative.   Gastrointestinal: Negative.   Genitourinary: Negative.   Skin: Negative.   Neurological: Negative.   Endo/Heme/Allergies: Negative.        Objective:   BP 118/70   Pulse 100   Temp 98.7 F (37.1 C)   Ht 5\' 8"  (1.727 m)   Wt 265 lb (120.2 kg)   SpO2 98%   BMI 40.29 kg/m   Vitals:   10/16/23 1303  BP: 118/70  Pulse: 100  Temp: 98.7 F (37.1 C)  Height: 5\' 8"  (1.727 m)  Weight: 265 lb (120.2 kg)  SpO2: 98%  BMI (Calculated): 40.3    Physical Exam Vitals  reviewed.  Constitutional:      Appearance: Normal appearance. He is obese.  HENT:     Head: Normocephalic.     Left Ear: There is no impacted cerumen.     Nose: Nose normal.     Mouth/Throat:     Mouth: Mucous membranes are moist.     Pharynx: No posterior oropharyngeal erythema.  Eyes:     Extraocular Movements: Extraocular movements intact.     Pupils: Pupils are equal, round, and reactive to light.  Cardiovascular:     Rate and Rhythm: Regular rhythm.     Chest Wall: PMI is not displaced.     Pulses: Normal pulses.     Heart sounds: Normal heart sounds. No murmur heard. Pulmonary:     Effort: Pulmonary effort is normal.     Breath sounds: Normal air entry. Rhonchi present. No rales.  Chest:  Comments: Left HD catheter site fine. Abdominal:     General: Abdomen is flat. Bowel sounds are normal. There is no distension.     Palpations: Abdomen is soft. There is no hepatomegaly, splenomegaly or mass.     Tenderness: There is no abdominal tenderness.  Musculoskeletal:        General: Normal range of motion.     Cervical back: Normal range of motion and neck supple.     Right lower leg: No edema.     Left lower leg: No edema.  Skin:    General: Skin is warm and dry.  Neurological:     General: No focal deficit present.     Mental Status: He is alert and oriented to person, place, and time.     Cranial Nerves: No cranial nerve deficit.     Motor: No weakness.  Psychiatric:        Mood and Affect: Mood normal.        Behavior: Behavior normal.      No results found for any visits on 10/16/23.  Recent Results (from the past 2160 hours)  Type and screen     Status: None   Collection Time: 09/29/23  2:22 PM  Result Value Ref Range   ABO/RH(D) O POS    Antibody Screen NEG    Sample Expiration 10/13/2023,2359    Extend sample reason      NO TRANSFUSIONS OR PREGNANCY IN THE PAST 3 MONTHS Performed at [facility], [address] [place], [place]  [address]   CBC with Differential/Platelet     Status: Abnormal   Collection Time: 09/29/23  2:22 PM  Result Value Ref Range   WBC 12.7 (H) 4.0 - 10.5 K/uL   RBC 2.82 (L) 4.22 - 5.81 MIL/uL   Hemoglobin 8.9 (L) 13.0 - 17.0 g/dL   HCT 60.4 (L) 54.0 - 98.1 %   MCV 96.5 80.0 - 100.0 fL   MCH 31.6 26.0 - 34.0 pg   MCHC 32.7 30.0 - 36.0 g/dL   RDW 19.1 (H) 47.8 - 29.5 %   Platelets 180 150 - 400 K/uL   nRBC 0.0 0.0 - 0.2 %   Neutrophils Relative % 72 %   Neutro Abs 9.2 (H) 1.7 - 7.7 K/uL   Lymphocytes Relative 17 %   Lymphs Abs 2.2 0.7 - 4.0 K/uL   Monocytes Relative 7 %   Monocytes Absolute 0.9 0.1 - 1.0 K/uL   Eosinophils Relative 2 %   Eosinophils Absolute 0.2 0.0 - 0.5 K/uL   Basophils Relative 1 %   Basophils Absolute 0.1 0.0 - 0.1 K/uL   Immature Granulocytes 1 %   Abs Immature Granulocytes 0.11 (H) 0.00 - 0.07 K/uL    Comment: Performed at [facility], [address] [place], [place] [address]  Basic metabolic panel     Status: Abnormal   Collection Time: 09/29/23  2:22 PM  Result Value Ref Range   Sodium 138 135 - 145 mmol/L   Potassium 3.5 3.5 - 5.1 mmol/L   Chloride 100 98 - 111 mmol/L   CO2 22 22 - 32 mmol/L   Glucose, Bld 129 (H) 70 - 99 mg/dL    Comment: Glucose reference range applies only to samples taken after fasting for at least 8 hours.   BUN 31 (H) 6 - 20 mg/dL   Creatinine, Ser 86.57 (H) 0.61 - 1.24 mg/dL   Calcium 8.8 (L) 8.9 - 10.3 mg/dL   GFR,  Estimated 5 (L) >60 mL/min    Comment: (NOTE) Calculated using the CKD-EPI Creatinine Equation (2021)    Anion gap 16 (H) 5 - 15    Comment: Performed at [facility], [address] [place], [place] [address]  I-STAT, [place] 8     Status: Abnormal   Collection Time: 10/04/23  2:33 PM  Result Value Ref Range   Sodium 139 135 - 145 mmol/L   Potassium 4.5 3.5 - 5.1 mmol/L   Chloride 101 98 - 111 mmol/L   BUN 41 (H) 6 - 20 mg/dL   Creatinine, Ser >60.45 (H) 0.61 - 1.24 mg/dL   Glucose, Bld  88 70 - 99 mg/dL    Comment: Glucose reference range applies only to samples taken after fasting for at least 8 hours.   Calcium, Ion 0.91 (L) 1.15 - 1.40 mmol/L   TCO2 24 22 - 32 mmol/L   Hemoglobin 9.2 (L) 13.0 - 17.0 g/dL   HCT 40.9 (L) 81.1 - 91.4 %      Assessment & Plan:  As per problem list  Problem List Items Addressed This Visit       Cardiovascular and Mediastinum   Essential hypertension - Primary   Other Visit Diagnoses       Class 1 obesity due to excess calories with serious comorbidity and body mass index (BMI) of 32.0 to 32.9 in adult         Acute bronchitis due to other specified organisms       Relevant Medications   predniSONE (DELTASONE) 20 MG tablet   doxycycline (VIBRA-TABS) 100 MG tablet       Return in about 4 months (around 02/13/2024) for BP followup.   Total time spent: 30 minutes  Luna Fuse, MD  10/16/2023   This document may have been prepared by [facility] Voice Recognition software and as such may include unintentional dictation errors.

## 2023-10-17 DIAGNOSIS — N2581 Secondary hyperparathyroidism of renal origin: Secondary | ICD-10-CM | POA: Diagnosis not present

## 2023-10-17 DIAGNOSIS — N186 End stage renal disease: Secondary | ICD-10-CM | POA: Diagnosis not present

## 2023-10-17 DIAGNOSIS — Z992 Dependence on renal dialysis: Secondary | ICD-10-CM | POA: Diagnosis not present

## 2023-10-18 DIAGNOSIS — Z992 Dependence on renal dialysis: Secondary | ICD-10-CM | POA: Diagnosis not present

## 2023-10-18 DIAGNOSIS — N186 End stage renal disease: Secondary | ICD-10-CM | POA: Diagnosis not present

## 2023-10-18 DIAGNOSIS — N2581 Secondary hyperparathyroidism of renal origin: Secondary | ICD-10-CM | POA: Diagnosis not present

## 2023-10-23 DIAGNOSIS — T82828A Fibrosis of vascular prosthetic devices, implants and grafts, initial encounter: Secondary | ICD-10-CM | POA: Diagnosis not present

## 2023-10-23 DIAGNOSIS — D631 Anemia in chronic kidney disease: Secondary | ICD-10-CM | POA: Diagnosis not present

## 2023-10-23 DIAGNOSIS — Z4901 Encounter for fitting and adjustment of extracorporeal dialysis catheter: Secondary | ICD-10-CM | POA: Diagnosis not present

## 2023-10-23 DIAGNOSIS — N186 End stage renal disease: Secondary | ICD-10-CM | POA: Diagnosis not present

## 2023-10-23 DIAGNOSIS — I120 Hypertensive chronic kidney disease with stage 5 chronic kidney disease or end stage renal disease: Secondary | ICD-10-CM | POA: Diagnosis not present

## 2023-10-24 DIAGNOSIS — Z992 Dependence on renal dialysis: Secondary | ICD-10-CM | POA: Diagnosis not present

## 2023-10-24 DIAGNOSIS — N186 End stage renal disease: Secondary | ICD-10-CM | POA: Diagnosis not present

## 2023-10-24 DIAGNOSIS — N2581 Secondary hyperparathyroidism of renal origin: Secondary | ICD-10-CM | POA: Diagnosis not present

## 2023-10-26 DIAGNOSIS — N2581 Secondary hyperparathyroidism of renal origin: Secondary | ICD-10-CM | POA: Diagnosis not present

## 2023-10-26 DIAGNOSIS — N186 End stage renal disease: Secondary | ICD-10-CM | POA: Diagnosis not present

## 2023-10-26 DIAGNOSIS — Z992 Dependence on renal dialysis: Secondary | ICD-10-CM | POA: Diagnosis not present

## 2023-10-27 DIAGNOSIS — Z992 Dependence on renal dialysis: Secondary | ICD-10-CM | POA: Diagnosis not present

## 2023-10-27 DIAGNOSIS — N186 End stage renal disease: Secondary | ICD-10-CM | POA: Diagnosis not present

## 2023-10-28 DIAGNOSIS — N2581 Secondary hyperparathyroidism of renal origin: Secondary | ICD-10-CM | POA: Diagnosis not present

## 2023-10-28 DIAGNOSIS — Z992 Dependence on renal dialysis: Secondary | ICD-10-CM | POA: Diagnosis not present

## 2023-10-28 DIAGNOSIS — N186 End stage renal disease: Secondary | ICD-10-CM | POA: Diagnosis not present

## 2023-10-31 DIAGNOSIS — N186 End stage renal disease: Secondary | ICD-10-CM | POA: Diagnosis not present

## 2023-10-31 DIAGNOSIS — N2581 Secondary hyperparathyroidism of renal origin: Secondary | ICD-10-CM | POA: Diagnosis not present

## 2023-10-31 DIAGNOSIS — Z992 Dependence on renal dialysis: Secondary | ICD-10-CM | POA: Diagnosis not present

## 2023-11-02 DIAGNOSIS — N2581 Secondary hyperparathyroidism of renal origin: Secondary | ICD-10-CM | POA: Diagnosis not present

## 2023-11-02 DIAGNOSIS — Z992 Dependence on renal dialysis: Secondary | ICD-10-CM | POA: Diagnosis not present

## 2023-11-02 DIAGNOSIS — N186 End stage renal disease: Secondary | ICD-10-CM | POA: Diagnosis not present

## 2023-11-03 DIAGNOSIS — N2581 Secondary hyperparathyroidism of renal origin: Secondary | ICD-10-CM | POA: Diagnosis not present

## 2023-11-03 DIAGNOSIS — N186 End stage renal disease: Secondary | ICD-10-CM | POA: Diagnosis not present

## 2023-11-03 DIAGNOSIS — Z992 Dependence on renal dialysis: Secondary | ICD-10-CM | POA: Diagnosis not present

## 2023-11-04 DIAGNOSIS — N2581 Secondary hyperparathyroidism of renal origin: Secondary | ICD-10-CM | POA: Diagnosis not present

## 2023-11-04 DIAGNOSIS — Z992 Dependence on renal dialysis: Secondary | ICD-10-CM | POA: Diagnosis not present

## 2023-11-04 DIAGNOSIS — N186 End stage renal disease: Secondary | ICD-10-CM | POA: Diagnosis not present

## 2023-11-07 DIAGNOSIS — N2581 Secondary hyperparathyroidism of renal origin: Secondary | ICD-10-CM | POA: Diagnosis not present

## 2023-11-07 DIAGNOSIS — Z992 Dependence on renal dialysis: Secondary | ICD-10-CM | POA: Diagnosis not present

## 2023-11-07 DIAGNOSIS — N186 End stage renal disease: Secondary | ICD-10-CM | POA: Diagnosis not present

## 2023-11-09 DIAGNOSIS — N2581 Secondary hyperparathyroidism of renal origin: Secondary | ICD-10-CM | POA: Diagnosis not present

## 2023-11-09 DIAGNOSIS — Z992 Dependence on renal dialysis: Secondary | ICD-10-CM | POA: Diagnosis not present

## 2023-11-09 DIAGNOSIS — N186 End stage renal disease: Secondary | ICD-10-CM | POA: Diagnosis not present

## 2023-11-11 DIAGNOSIS — N186 End stage renal disease: Secondary | ICD-10-CM | POA: Diagnosis not present

## 2023-11-11 DIAGNOSIS — N2581 Secondary hyperparathyroidism of renal origin: Secondary | ICD-10-CM | POA: Diagnosis not present

## 2023-11-11 DIAGNOSIS — Z992 Dependence on renal dialysis: Secondary | ICD-10-CM | POA: Diagnosis not present

## 2023-11-14 DIAGNOSIS — N186 End stage renal disease: Secondary | ICD-10-CM | POA: Diagnosis not present

## 2023-11-14 DIAGNOSIS — Z992 Dependence on renal dialysis: Secondary | ICD-10-CM | POA: Diagnosis not present

## 2023-11-14 DIAGNOSIS — N2581 Secondary hyperparathyroidism of renal origin: Secondary | ICD-10-CM | POA: Diagnosis not present

## 2023-11-16 DIAGNOSIS — N186 End stage renal disease: Secondary | ICD-10-CM | POA: Diagnosis not present

## 2023-11-16 DIAGNOSIS — Z992 Dependence on renal dialysis: Secondary | ICD-10-CM | POA: Diagnosis not present

## 2023-11-16 DIAGNOSIS — N2581 Secondary hyperparathyroidism of renal origin: Secondary | ICD-10-CM | POA: Diagnosis not present

## 2023-11-18 DIAGNOSIS — N2581 Secondary hyperparathyroidism of renal origin: Secondary | ICD-10-CM | POA: Diagnosis not present

## 2023-11-18 DIAGNOSIS — N186 End stage renal disease: Secondary | ICD-10-CM | POA: Diagnosis not present

## 2023-11-18 DIAGNOSIS — Z992 Dependence on renal dialysis: Secondary | ICD-10-CM | POA: Diagnosis not present

## 2023-11-21 DIAGNOSIS — N186 End stage renal disease: Secondary | ICD-10-CM | POA: Diagnosis not present

## 2023-11-21 DIAGNOSIS — Z992 Dependence on renal dialysis: Secondary | ICD-10-CM | POA: Diagnosis not present

## 2023-11-21 DIAGNOSIS — N2581 Secondary hyperparathyroidism of renal origin: Secondary | ICD-10-CM | POA: Diagnosis not present

## 2023-11-23 ENCOUNTER — Other Ambulatory Visit (INDEPENDENT_AMBULATORY_CARE_PROVIDER_SITE_OTHER): Payer: Self-pay | Admitting: Vascular Surgery

## 2023-11-23 DIAGNOSIS — N2581 Secondary hyperparathyroidism of renal origin: Secondary | ICD-10-CM | POA: Diagnosis not present

## 2023-11-23 DIAGNOSIS — Z992 Dependence on renal dialysis: Secondary | ICD-10-CM | POA: Diagnosis not present

## 2023-11-23 DIAGNOSIS — Z95828 Presence of other vascular implants and grafts: Secondary | ICD-10-CM

## 2023-11-23 DIAGNOSIS — N186 End stage renal disease: Secondary | ICD-10-CM

## 2023-11-24 ENCOUNTER — Ambulatory Visit (INDEPENDENT_AMBULATORY_CARE_PROVIDER_SITE_OTHER): Payer: Medicare HMO

## 2023-11-24 ENCOUNTER — Encounter (INDEPENDENT_AMBULATORY_CARE_PROVIDER_SITE_OTHER): Payer: Self-pay | Admitting: Nurse Practitioner

## 2023-11-24 ENCOUNTER — Ambulatory Visit (INDEPENDENT_AMBULATORY_CARE_PROVIDER_SITE_OTHER): Payer: Medicare HMO | Admitting: Nurse Practitioner

## 2023-11-24 VITALS — BP 130/87 | HR 86 | Resp 18 | Ht 68.0 in | Wt 264.0 lb

## 2023-11-24 DIAGNOSIS — Z95828 Presence of other vascular implants and grafts: Secondary | ICD-10-CM | POA: Diagnosis not present

## 2023-11-24 DIAGNOSIS — I10 Essential (primary) hypertension: Secondary | ICD-10-CM

## 2023-11-24 DIAGNOSIS — N186 End stage renal disease: Secondary | ICD-10-CM

## 2023-11-24 DIAGNOSIS — Z992 Dependence on renal dialysis: Secondary | ICD-10-CM

## 2023-11-24 NOTE — H&P (View-Only) (Signed)
 Subjective:    Patient ID: Joseph Gilmore, male    DOB: [birth year], 53 y.o.   MRN: [id number] Chief Complaint  Patient presents with   Follow-up    follow up after procedure with and HDA    The patient is a 53 year old male who recently underwent placement of a left brachial axillary AV graft.  He presents today for his first evaluation.  He has a Comptroller which she has been utilizing.  Today noninvasive studies show he has a flow volume of 184.  He appears to be improving the anastomosis site and throughout.    Review of Systems  All other systems reviewed and are negative.      Objective:   Physical Exam Vitals reviewed.  HENT:     Head: Normocephalic.  Cardiovascular:     Rate and Rhythm: Normal rate.  Pulmonary:     Effort: Pulmonary effort is normal.  Skin:    General: Skin is warm and dry.  Neurological:     Mental Status: He is alert. Mental status is at baseline.  Psychiatric:        Mood and Affect: Mood normal.        Behavior: Behavior normal.        Thought Content: Thought content normal.        Judgment: Judgment normal.     BP 130/87   Pulse 86   Resp 18   Ht 5\' 8"  (1.727 m)   Wt 264 lb (119.7 kg)   BMI 40.14 kg/m   Past Medical History:  Diagnosis Date   Autism    Chronic kidney disease    ESRD (end stage renal disease) (HCC)    Hypertension    Murmur    OCD (obsessive compulsive disorder)     Social History   Socioeconomic History   Marital status: Single    Spouse name: Not on file   Number of children: Not on file   Years of education: Not on file   Highest education level: Not on file  Occupational History   Not on file  Tobacco Use   Smoking status: Never    Passive exposure: Never   Smokeless tobacco: Never  Vaping Use   Vaping status: Never Used  Substance and Sexual Activity   Alcohol use: Never   Drug use: Never   Sexual activity: Not Currently  Other Topics Concern   Not on file  Social History Narrative    Lives with father .   Social Drivers of Corporate investment banker Strain: Not on file  Food Insecurity: No Food Insecurity (05/10/2022)   Hunger Vital Sign    Worried About Running Out of Food in the Last Year: Never true    Ran Out of Food in the Last Year: Never true  Transportation Needs: No Transportation Needs (05/10/2022)   PRAPARE - Administrator, Civil Service (Medical): No    Lack of Transportation (Non-Medical): No  Physical Activity: Not on file  Stress: Not on file  Social Connections: Not on file  Intimate Partner Violence: Not on file    Past Surgical History:  Procedure Laterality Date   A/V FISTULAGRAM Left 10/07/2020   Procedure: A/V FISTULAGRAM;  Surgeon: Renford Dills, MD;  Location: [facility];  Service: Cardiovascular;  Laterality: Left;   A/V FISTULAGRAM Left 02/15/2023   Procedure: A/V Fistulagram;  Surgeon: Renford Dills, MD;  Location: [facility];  Service:  Cardiovascular;  Laterality: Left;   AV FISTULA PLACEMENT Left 07/15/2020   Procedure: ARTERIOVENOUS FISTULA CREATION;  Surgeon: Renford Dills, MD;  Location: [facility];  Service: Vascular;  Laterality: Left;   AV FISTULA PLACEMENT Left 10/04/2023   Procedure: INSERTION OF ARTERIOVENOUS (AV) GORE-TEX GRAFT ARM (BRACHIAL AXILLARY);  Surgeon: Renford Dills, MD;  Location: [facility];  Service: Vascular;  Laterality: Left;   DIALYSIS/PERMA CATHETER INSERTION N/A 04/06/2020   Procedure: DIALYSIS/PERMA CATHETER INSERTION;  Surgeon: Annice Needy, MD;  Location: [facility];  Service: Cardiovascular;  Laterality: N/A;   DIALYSIS/PERMA CATHETER INSERTION N/A 05/26/2020   Procedure: DIALYSIS/PERMA CATHETER INSERTION;  Surgeon: Renford Dills, MD;  Location: [facility];  Service: Cardiovascular;  Laterality: N/A;   DIALYSIS/PERMA CATHETER INSERTION N/A 07/09/2020   Procedure: DIALYSIS/PERMA CATHETER INSERTION;  Surgeon: Annice Needy, MD;  Location:  [facility];  Service: Cardiovascular;  Laterality: N/A;   DIALYSIS/PERMA CATHETER INSERTION N/A 01/08/2021   Procedure: DIALYSIS/PERMA CATHETER INSERTION;  Surgeon: Annice Needy, MD;  Location: [facility];  Service: Cardiovascular;  Laterality: N/A;   DIALYSIS/PERMA CATHETER INSERTION N/A 03/17/2023   Procedure: DIALYSIS/PERMA CATHETER INSERTION;  Surgeon: Renford Dills, MD;  Location: [facility];  Service: Cardiovascular;  Laterality: N/A;   DIALYSIS/PERMA CATHETER REMOVAL N/A 03/15/2021   Procedure: DIALYSIS/PERMA CATHETER REMOVAL;  Surgeon: Annice Needy, MD;  Location: [facility];  Service: Cardiovascular;  Laterality: N/A;   REVISON OF ARTERIOVENOUS FISTULA Left 12/11/2020   Procedure: ARTERIOVENOUS (AV) FISTULA CREATION ( BRACHIAL CEPHALIC );  Surgeon: Renford Dills, MD;  Location: [facility];  Service: Vascular;  Laterality: Left;   TEMPORARY DIALYSIS CATHETER N/A 03/15/2023   Procedure: TEMPORARY DIALYSIS CATHETER;  Surgeon: Renford Dills, MD;  Location: [facility];  Service: Cardiovascular;  Laterality: N/A;    History reviewed. No pertinent family history.  Allergies  Allergen Reactions   Chlorhexidine Other (See Comments)    Blisters (topical)       Latest Ref Rng & Units 10/04/2023    2:33 PM 09/29/2023    2:22 PM 03/16/2023    3:07 AM  CBC  WBC 4.0 - 10.5 K/uL  12.7  9.0   Hemoglobin 13.0 - 17.0 g/dL 9.2  8.9  9.8   Hematocrit 39.0 - 52.0 % 27.0  27.2  29.6   Platelets 150 - 400 K/uL  180  185       CMP     Component Value Date/Time   NA 139 10/04/2023 1433   K 4.5 10/04/2023 1433   CL 101 10/04/2023 1433   CO2 22 09/29/2023 1422   GLUCOSE 88 10/04/2023 1433   BUN 41 (H) 10/04/2023 1433   CREATININE >18.00 (H) 10/04/2023 1433   CALCIUM 8.8 (L) 09/29/2023 1422   PROT 6.5 03/16/2023 0307   ALBUMIN 3.2 (L) 03/16/2023 0307   AST 15 03/16/2023 0307   ALT 13 03/16/2023 0307   ALKPHOS 50 03/16/2023 0307    BILITOT 0.6 03/16/2023 0307   GFRNONAA 5 (L) 09/29/2023 1422     No results found.     Assessment & Plan:   1. ESRD on hemodialysis (HCC) (Primary) Recommend:  The patient's cough is occluded at the anastomosis.  It has not been used yet.  He will still utilizes PermCath.  We discussed that the fistulogram is attempt to establish the access and if it is unable to be declotted we may require  a new upper extremity access.  The risks, benefits and alternative therapies were reviewed in detail with the patient.  All questions were answered.  The patient agrees to proceed with angio/intervention.    The patient will follow up with me in the office after the procedure.   2. Essential hypertension Continue antihypertensive medications as already ordered, these medications have been reviewed and there are no changes at this time.   Current Outpatient Medications on File Prior to Visit  Medication Sig Dispense Refill   calcium acetate (PHOSLO) 667 MG capsule Take 667 mg by mouth 3 (three) times daily with meals.     Cholecalciferol (VITAMIN D-3) 125 MCG (5000 UT) TABS Take 5,000 Units by mouth daily.     dutasteride (AVODART) 0.5 MG capsule TAKE ONE CAPSULE BY MOUTH DAILY 90 capsule 3   HYDROcodone-acetaminophen (NORCO) 5-325 MG tablet Take 1-2 tablets by mouth every 6 (six) hours as needed for moderate pain (pain score 4-6) or severe pain (pain score 7-10). 30 tablet 0   midodrine (PROAMATINE) 2.5 MG tablet Take 2.5 mg by mouth See admin instructions. Take 2.5 mg by mouth on Tuesday, Thursday and Saturday at dialysis as needed for low blood pressure.     multivitamin (RENA-VIT) TABS tablet Take by mouth.     tamsulosin (FLOMAX) 0.4 MG CAPS capsule TAKE ONE CAPSULE BY MOUTH DAILY 90 capsule 3   Methoxy PEG-Epoetin Beta (MIRCERA IJ) Mircera (Patient not taking: Reported on 09/29/2023)     No current facility-administered medications on file prior to visit.    There are no Patient  Instructions on file for this visit. No follow-ups on file.   Georgiana Spinner, NP

## 2023-11-24 NOTE — Progress Notes (Signed)
 Subjective:    Patient ID: Joseph Gilmore, male    DOB: [birth year], 53 y.o.   MRN: [id number] Chief Complaint  Patient presents with   Follow-up    follow up after procedure with and HDA    The patient is a 53 year old male who recently underwent placement of a left brachial axillary AV graft.  He presents today for his first evaluation.  He has a Comptroller which she has been utilizing.  Today noninvasive studies show he has a flow volume of 184.  He appears to be improving the anastomosis site and throughout.    Review of Systems  All other systems reviewed and are negative.      Objective:   Physical Exam Vitals reviewed.  HENT:     Head: Normocephalic.  Cardiovascular:     Rate and Rhythm: Normal rate.  Pulmonary:     Effort: Pulmonary effort is normal.  Skin:    General: Skin is warm and dry.  Neurological:     Mental Status: He is alert. Mental status is at baseline.  Psychiatric:        Mood and Affect: Mood normal.        Behavior: Behavior normal.        Thought Content: Thought content normal.        Judgment: Judgment normal.     BP 130/87   Pulse 86   Resp 18   Ht 5\' 8"  (1.727 m)   Wt 264 lb (119.7 kg)   BMI 40.14 kg/m   Past Medical History:  Diagnosis Date   Autism    Chronic kidney disease    ESRD (end stage renal disease) (HCC)    Hypertension    Murmur    OCD (obsessive compulsive disorder)     Social History   Socioeconomic History   Marital status: Single    Spouse name: Not on file   Number of children: Not on file   Years of education: Not on file   Highest education level: Not on file  Occupational History   Not on file  Tobacco Use   Smoking status: Never    Passive exposure: Never   Smokeless tobacco: Never  Vaping Use   Vaping status: Never Used  Substance and Sexual Activity   Alcohol use: Never   Drug use: Never   Sexual activity: Not Currently  Other Topics Concern   Not on file  Social History Narrative    Lives with father .   Social Drivers of Corporate investment banker Strain: Not on file  Food Insecurity: No Food Insecurity (05/10/2022)   Hunger Vital Sign    Worried About Running Out of Food in the Last Year: Never true    Ran Out of Food in the Last Year: Never true  Transportation Needs: No Transportation Needs (05/10/2022)   PRAPARE - Administrator, Civil Service (Medical): No    Lack of Transportation (Non-Medical): No  Physical Activity: Not on file  Stress: Not on file  Social Connections: Not on file  Intimate Partner Violence: Not on file    Past Surgical History:  Procedure Laterality Date   A/V FISTULAGRAM Left 10/07/2020   Procedure: A/V FISTULAGRAM;  Surgeon: Renford Dills, MD;  Location: [facility];  Service: Cardiovascular;  Laterality: Left;   A/V FISTULAGRAM Left 02/15/2023   Procedure: A/V Fistulagram;  Surgeon: Renford Dills, MD;  Location: [facility];  Service:  Cardiovascular;  Laterality: Left;   AV FISTULA PLACEMENT Left 07/15/2020   Procedure: ARTERIOVENOUS FISTULA CREATION;  Surgeon: Renford Dills, MD;  Location: [facility];  Service: Vascular;  Laterality: Left;   AV FISTULA PLACEMENT Left 10/04/2023   Procedure: INSERTION OF ARTERIOVENOUS (AV) GORE-TEX GRAFT ARM (BRACHIAL AXILLARY);  Surgeon: Renford Dills, MD;  Location: [facility];  Service: Vascular;  Laterality: Left;   DIALYSIS/PERMA CATHETER INSERTION N/A 04/06/2020   Procedure: DIALYSIS/PERMA CATHETER INSERTION;  Surgeon: Annice Needy, MD;  Location: [facility];  Service: Cardiovascular;  Laterality: N/A;   DIALYSIS/PERMA CATHETER INSERTION N/A 05/26/2020   Procedure: DIALYSIS/PERMA CATHETER INSERTION;  Surgeon: Renford Dills, MD;  Location: [facility];  Service: Cardiovascular;  Laterality: N/A;   DIALYSIS/PERMA CATHETER INSERTION N/A 07/09/2020   Procedure: DIALYSIS/PERMA CATHETER INSERTION;  Surgeon: Annice Needy, MD;  Location:  [facility];  Service: Cardiovascular;  Laterality: N/A;   DIALYSIS/PERMA CATHETER INSERTION N/A 01/08/2021   Procedure: DIALYSIS/PERMA CATHETER INSERTION;  Surgeon: Annice Needy, MD;  Location: [facility];  Service: Cardiovascular;  Laterality: N/A;   DIALYSIS/PERMA CATHETER INSERTION N/A 03/17/2023   Procedure: DIALYSIS/PERMA CATHETER INSERTION;  Surgeon: Renford Dills, MD;  Location: [facility];  Service: Cardiovascular;  Laterality: N/A;   DIALYSIS/PERMA CATHETER REMOVAL N/A 03/15/2021   Procedure: DIALYSIS/PERMA CATHETER REMOVAL;  Surgeon: Annice Needy, MD;  Location: [facility];  Service: Cardiovascular;  Laterality: N/A;   REVISON OF ARTERIOVENOUS FISTULA Left 12/11/2020   Procedure: ARTERIOVENOUS (AV) FISTULA CREATION ( BRACHIAL CEPHALIC );  Surgeon: Renford Dills, MD;  Location: [facility];  Service: Vascular;  Laterality: Left;   TEMPORARY DIALYSIS CATHETER N/A 03/15/2023   Procedure: TEMPORARY DIALYSIS CATHETER;  Surgeon: Renford Dills, MD;  Location: [facility];  Service: Cardiovascular;  Laterality: N/A;    History reviewed. No pertinent family history.  Allergies  Allergen Reactions   Chlorhexidine Other (See Comments)    Blisters (topical)       Latest Ref Rng & Units 10/04/2023    2:33 PM 09/29/2023    2:22 PM 03/16/2023    3:07 AM  CBC  WBC 4.0 - 10.5 K/uL  12.7  9.0   Hemoglobin 13.0 - 17.0 g/dL 9.2  8.9  9.8   Hematocrit 39.0 - 52.0 % 27.0  27.2  29.6   Platelets 150 - 400 K/uL  180  185       CMP     Component Value Date/Time   NA 139 10/04/2023 1433   K 4.5 10/04/2023 1433   CL 101 10/04/2023 1433   CO2 22 09/29/2023 1422   GLUCOSE 88 10/04/2023 1433   BUN 41 (H) 10/04/2023 1433   CREATININE >18.00 (H) 10/04/2023 1433   CALCIUM 8.8 (L) 09/29/2023 1422   PROT 6.5 03/16/2023 0307   ALBUMIN 3.2 (L) 03/16/2023 0307   AST 15 03/16/2023 0307   ALT 13 03/16/2023 0307   ALKPHOS 50 03/16/2023 0307    BILITOT 0.6 03/16/2023 0307   GFRNONAA 5 (L) 09/29/2023 1422     No results found.     Assessment & Plan:   1. ESRD on hemodialysis (HCC) (Primary) Recommend:  The patient's cough is occluded at the anastomosis.  It has not been used yet.  He will still utilizes PermCath.  We discussed that the fistulogram is attempt to establish the access and if it is unable to be declotted we may require  a new upper extremity access.  The risks, benefits and alternative therapies were reviewed in detail with the patient.  All questions were answered.  The patient agrees to proceed with angio/intervention.    The patient will follow up with me in the office after the procedure.   2. Essential hypertension Continue antihypertensive medications as already ordered, these medications have been reviewed and there are no changes at this time.   Current Outpatient Medications on File Prior to Visit  Medication Sig Dispense Refill   calcium acetate (PHOSLO) 667 MG capsule Take 667 mg by mouth 3 (three) times daily with meals.     Cholecalciferol (VITAMIN D-3) 125 MCG (5000 UT) TABS Take 5,000 Units by mouth daily.     dutasteride (AVODART) 0.5 MG capsule TAKE ONE CAPSULE BY MOUTH DAILY 90 capsule 3   HYDROcodone-acetaminophen (NORCO) 5-325 MG tablet Take 1-2 tablets by mouth every 6 (six) hours as needed for moderate pain (pain score 4-6) or severe pain (pain score 7-10). 30 tablet 0   midodrine (PROAMATINE) 2.5 MG tablet Take 2.5 mg by mouth See admin instructions. Take 2.5 mg by mouth on Tuesday, Thursday and Saturday at dialysis as needed for low blood pressure.     multivitamin (RENA-VIT) TABS tablet Take by mouth.     tamsulosin (FLOMAX) 0.4 MG CAPS capsule TAKE ONE CAPSULE BY MOUTH DAILY 90 capsule 3   Methoxy PEG-Epoetin Beta (MIRCERA IJ) Mircera (Patient not taking: Reported on 09/29/2023)     No current facility-administered medications on file prior to visit.    There are no Patient  Instructions on file for this visit. No follow-ups on file.   Georgiana Spinner, NP

## 2023-11-25 DIAGNOSIS — Z992 Dependence on renal dialysis: Secondary | ICD-10-CM | POA: Diagnosis not present

## 2023-11-25 DIAGNOSIS — N2581 Secondary hyperparathyroidism of renal origin: Secondary | ICD-10-CM | POA: Diagnosis not present

## 2023-11-25 DIAGNOSIS — N186 End stage renal disease: Secondary | ICD-10-CM | POA: Diagnosis not present

## 2023-11-27 ENCOUNTER — Telehealth (INDEPENDENT_AMBULATORY_CARE_PROVIDER_SITE_OTHER): Payer: Self-pay

## 2023-11-27 DIAGNOSIS — N186 End stage renal disease: Secondary | ICD-10-CM | POA: Diagnosis not present

## 2023-11-27 DIAGNOSIS — Z992 Dependence on renal dialysis: Secondary | ICD-10-CM | POA: Diagnosis not present

## 2023-11-27 NOTE — Telephone Encounter (Signed)
 Spoke with the patient and he is scheduled with Dr. Gilda Crease for a left arm fistulagram on 11/28/03 with a 2:00 pm arrival time to the [facility]. Pre-procedure instructions were discussed and will and patient stated he understood. Patient does not have Mychart.

## 2023-11-28 ENCOUNTER — Other Ambulatory Visit: Payer: Self-pay

## 2023-11-28 ENCOUNTER — Encounter: Payer: Self-pay | Admitting: Vascular Surgery

## 2023-11-28 ENCOUNTER — Encounter
Admission: RE | Disposition: A | Discharge status: Home / Self Care | Payer: Self-pay | Source: Home / Self Care | Attending: Vascular Surgery

## 2023-11-28 ENCOUNTER — Ambulatory Visit
Admission: RE | Admit: 2023-11-28 | Discharge: 2023-11-28 | Disposition: A | Discharge status: Home / Self Care | Attending: Vascular Surgery | Admitting: Vascular Surgery

## 2023-11-28 DIAGNOSIS — Z79899 Other long term (current) drug therapy: Secondary | ICD-10-CM | POA: Diagnosis not present

## 2023-11-28 DIAGNOSIS — Y832 Surgical operation with anastomosis, bypass or graft as the cause of abnormal reaction of the patient, or of later complication, without mention of misadventure at the time of the procedure: Secondary | ICD-10-CM | POA: Insufficient documentation

## 2023-11-28 DIAGNOSIS — N186 End stage renal disease: Secondary | ICD-10-CM | POA: Diagnosis not present

## 2023-11-28 DIAGNOSIS — T82868A Thrombosis of vascular prosthetic devices, implants and grafts, initial encounter: Secondary | ICD-10-CM | POA: Insufficient documentation

## 2023-11-28 DIAGNOSIS — Z992 Dependence on renal dialysis: Secondary | ICD-10-CM | POA: Insufficient documentation

## 2023-11-28 DIAGNOSIS — I120 Hypertensive chronic kidney disease with stage 5 chronic kidney disease or end stage renal disease: Secondary | ICD-10-CM | POA: Insufficient documentation

## 2023-11-28 HISTORY — PX: A/V FISTULAGRAM: CATH118298

## 2023-11-28 LAB — POTASSIUM (ARMC VASCULAR LAB ONLY): Potassium (ARMC vascular lab): 4.7 mmol/L (ref 3.5–5.1)

## 2023-11-28 SURGERY — A/V FISTULAGRAM
Anesthesia: Moderate Sedation | Laterality: Left

## 2023-11-28 MED ORDER — CEFAZOLIN SODIUM-DEXTROSE 1-4 GM/50ML-% IV SOLN
INTRAVENOUS | Status: AC
  Filled 2023-11-28: qty 50

## 2023-11-28 MED ORDER — CEFAZOLIN SODIUM-DEXTROSE 1-4 GM/50ML-% IV SOLN
1.0000 g | INTRAVENOUS | Status: AC
  Administered 2023-11-28: 1 g via INTRAVENOUS

## 2023-11-28 MED ORDER — ONDANSETRON HCL 4 MG/2ML IJ SOLN: 4.0000 mg | Freq: Four times a day (QID) | INTRAMUSCULAR | Status: DC | PRN

## 2023-11-28 MED ORDER — FAMOTIDINE 20 MG PO TABS: 40.0000 mg | ORAL_TABLET | Freq: Once | ORAL | Status: DC | PRN

## 2023-11-28 MED ORDER — HEPARIN SODIUM (PORCINE) 1000 UNIT/ML IJ SOLN
INTRAMUSCULAR | Status: AC
Start: 2023-11-28 — End: ?
  Filled 2023-11-28: qty 10

## 2023-11-28 MED ORDER — MIDAZOLAM HCL 5 MG/5ML IJ SOLN
INTRAMUSCULAR | Status: AC
  Filled 2023-11-28: qty 5

## 2023-11-28 MED ORDER — HEPARIN (PORCINE) IN NACL 1000-0.9 UT/500ML-% IV SOLN
INTRAVENOUS | Status: DC | PRN
Start: 2023-11-28 — End: 2023-11-28
  Administered 2023-11-28: 1000 mL

## 2023-11-28 MED ORDER — HEPARIN SODIUM (PORCINE) 1000 UNIT/ML IJ SOLN
INTRAMUSCULAR | Status: DC | PRN
  Administered 2023-11-28: 6000 [IU] via INTRAVENOUS

## 2023-11-28 MED ORDER — LIDOCAINE HCL (PF) 1 % IJ SOLN
INTRAMUSCULAR | Status: DC | PRN
  Administered 2023-11-28: 10 mL

## 2023-11-28 MED ORDER — MIDAZOLAM HCL 2 MG/2ML IJ SOLN
INTRAMUSCULAR | Status: DC | PRN
  Administered 2023-11-28: .5 mg via INTRAVENOUS
  Administered 2023-11-28: 2 mg via INTRAVENOUS
  Administered 2023-11-28: .5 mg via INTRAVENOUS

## 2023-11-28 MED ORDER — METHYLPREDNISOLONE SODIUM SUCC 125 MG IJ SOLR: 125.0000 mg | Freq: Once | INTRAMUSCULAR | Status: DC | PRN

## 2023-11-28 MED ORDER — SODIUM CHLORIDE 0.9 % IV SOLN: INTRAVENOUS | Status: DC

## 2023-11-28 MED ORDER — IODIXANOL 320 MG/ML IV SOLN
INTRAVENOUS | Status: DC | PRN
  Administered 2023-11-28: 50 mL

## 2023-11-28 MED ORDER — HYDROMORPHONE HCL 1 MG/ML IJ SOLN: 1.0000 mg | Freq: Once | INTRAMUSCULAR | Status: DC | PRN

## 2023-11-28 MED ORDER — FENTANYL CITRATE (PF) 100 MCG/2ML IJ SOLN
INTRAMUSCULAR | Status: DC | PRN
  Administered 2023-11-28 (×2): 25 ug via INTRAVENOUS
  Administered 2023-11-28: 50 ug via INTRAVENOUS

## 2023-11-28 MED ORDER — FENTANYL CITRATE (PF) 100 MCG/2ML IJ SOLN
INTRAMUSCULAR | Status: AC
  Filled 2023-11-28: qty 2

## 2023-11-28 MED ORDER — MIDAZOLAM HCL 2 MG/ML PO SYRP: 8.0000 mg | ORAL_SOLUTION | Freq: Once | ORAL | Status: DC | PRN

## 2023-11-28 MED ORDER — DIPHENHYDRAMINE HCL 50 MG/ML IJ SOLN: 50.0000 mg | Freq: Once | INTRAMUSCULAR | Status: DC | PRN

## 2023-11-28 SURGICAL SUPPLY — 25 items
BALLN DORADO 7X60X80 (BALLOONS) ×1 IMPLANT
BALLN DORADO 8X100X80 (BALLOONS) ×1 IMPLANT
BALLN LUTONIX DCB 4X40X130 (BALLOONS) ×1 IMPLANT
BALLOON DORADO 7X60X80 (BALLOONS) IMPLANT
BALLOON DORADO 8X100X80 (BALLOONS) IMPLANT
BALLOON LUTONIX DCB 4X40X130 (BALLOONS) IMPLANT
CATH BEACON 5 .035 65 KMP TIP (CATHETERS) IMPLANT
CATH EMBOLECTOMY 5FR (BALLOONS) IMPLANT
CATH THROMBEC 7F 65 CLEANER15 (CATHETERS) IMPLANT
COVER PROBE ULTRASOUND 5X96 (MISCELLANEOUS) IMPLANT
DEVICE PRESTO INFLATION (MISCELLANEOUS) IMPLANT
DRAPE BRACHIAL (DRAPES) IMPLANT
GLIDEWIRE ADV .035X180CM (WIRE) IMPLANT
GLIDEWIRE STIFF .35X180X3 HYDR (WIRE) IMPLANT
GOWN STRL REUS W/ TWL LRG LVL3 (GOWN DISPOSABLE) ×1 IMPLANT
NDL ENTRY 21GA 7CM ECHOTIP (NEEDLE) IMPLANT
NEEDLE ENTRY 21GA 7CM ECHOTIP (NEEDLE) ×1 IMPLANT
PACK ANGIOGRAPHY (CUSTOM PROCEDURE TRAY) ×1 IMPLANT
SET INTRO CAPELLA COAXIAL (SET/KITS/TRAYS/PACK) IMPLANT
SHEATH BRITE TIP 6FRX5.5 (SHEATH) IMPLANT
SHEATH BRITE TIP 7FRX5.5 (SHEATH) IMPLANT
STENT COVERA FLARED 8X60X80 (Permanent Stent) IMPLANT
STENT COVERA STRAIGHT8X60X80 (Permanent Stent) IMPLANT
SUT MNCRL AB 4-0 PS2 18 (SUTURE) IMPLANT
WIRE SUPRACORE 300CM (WIRE) IMPLANT

## 2023-11-28 NOTE — Op Note (Addendum)
 OPERATIVE NOTE   PROCEDURE: Contrast injection left arm brachial axillary AV graft. Mechanical thrombectomy of left arm brachial axillary AV graft. Percutaneous transluminal angioplasty and stent placement left arm brachial axillary AV graft venous outflow, peripheral segment Percutaneous transluminal angioplasty of the arterial anastomosis to 4 mm.   PRE-OPERATIVE DIAGNOSIS: Complication of dialysis access                                                       End Stage Renal Disease  POST-OPERATIVE DIAGNOSIS: same as above   SURGEON: Renford Dills, M.D.  ANESTHESIA: Conscious sedation was administered by the radiology RN under my direct supervision. IV Versed plus fentanyl were utilized. Continuous ECG, pulse oximetry and blood pressure was monitored throughout the entire procedure. Conscious sedation was for a total of 62 minutes.    ESTIMATED BLOOD LOSS: minimal  FINDING(S): Thrombus within the graft there is a narrowing of the axillary vein that extends from the anastomosis of the graft up to the distal subclavian vein  SPECIMEN(S):  None  CONTRAST: 50 cc  FLUOROSCOPY TIME: 11.8 minutes  INDICATIONS: Joseph Gilmore is a 53 y.o. male who  presents with a thrombosed AV access.  The patient is scheduled for angiography with possible intervention of the AV access.  The patient is aware the risks include but are not limited to: bleeding, infection, thrombosis of the cannulated access, and possible anaphylactic reaction to the contrast.  The patient acknowledges if the access can not be salvaged a tunneled catheter will be needed and will be placed during this procedure.  The patient is aware of the risks of the procedure and elects to proceed with the angiogram and intervention.  DESCRIPTION: After full informed written consent was obtained, the patient was brought back to the Special Procedure suite and placed supine position.  Appropriate cardiopulmonary monitors were  placed.  The left arm was prepped and draped in the standard fashion.  Appropriate timeout is called. The left AV graft was cannulated with a micropuncture needle using ultrasound guidance.  With the ultrasound the AV access appeared to be filled with heterogeneous material and was poorly compressible indicating thrombosis of the AV access. The puncture was made under direct ultrasound visualization and an image was recorded for the permanent record.  The microwire was advanced and the needle was exchanged for  a microsheath.  The J-wire was then advanced and a 6 Fr sheath inserted.  Hand was then performed which demonstrated thrombus within the AV access.  Using a Glidewire and a Kumpe catheter I was able to negotiate the catheter into the subclavian vein where hand-injection contrast was done.  The central venous structures were also imaged and were noted to be patent.  6000 units of heparin was given and allowed to circulate as well.  The 6 French sheath was upsized to a 7 [place] sheath.  Working over [employer] given the difficulty I encountered initially crossing the venous anastomosis a 7 mm x 60 mm Dorado balloon was advanced across the lesion and into the axillary vein.  3 serial inflations were performed beginning in the axillary vein and extending back to the sheath.  A Cleaner device was then advanced beginning centrally and pulling back while it was engaged.  Several passes were made through the venous portion of the  graft. Follow-up imaging now demonstrates the vast majority of the clot had been treated.   Therefore a retrograde sheath was inserted. This was a 6 [place] sheath and was positioned more proximally on the arm and angled in the retrograde direction. The graft was cannulated with a micropuncture needle using ultrasound guidance.  With the ultrasound the AV access appeared to be filled with heterogeneous material and was poorly compressible indicating thrombosis of the AV access. The  puncture was made under direct ultrasound visualization and an image was recorded for the permanent record.  Subsequently a floppy Glidewire and a KMP catheter were negotiated into the brachial artery more proximally which was actually the radial artery as the patient is now identified as having a high takeoff of the radial artery.  Hand injection contrast was then utilized to demonstrate patency of the artery as well as the location for the anastomosis.  And over the wire Fogarty was now advanced through the retrograde sheath was extended out into the artery the balloon was inflated under direct fluoroscopic visualization and it was slowly pulled back into the graft all the way past the antegrade 7 French sheath.  2 passes were made on the arterial portion with the balloon advancing into the more proximal radial artery.  The wire was then repositioned into the distal radial artery and another pass was made.  Next working over an advantage wire a 4 mm by 40 mm Lutonix drug-eluting balloon was advanced across the anastomosis and inflated to 8 atm for 1 minute.  Pulsatility of the access was reestablished. Follow-up imaging demonstrates there was now thrombus in the venous portion surrounding the sheath and this was treated with the cleaner device from the antegrade direction. After several passes imaging demonstrated resolution of thrombus within the graft and forward flow however stricture of the graft was noted as expected in the venous outflow extending into the axillary vein.  Supra core wire was then advanced through the antegrade sheath and 2 Covera stents were utilized the first 1 deployed was an 8 mm x 60 mm straight this extended from the graft itself into the axillary artery a second 8 mm x 60 mm Covera stent was then deployed extending up to the distal subclavian this was a flared Covera to match the subclavian vein.  They were then postdilated with an 8 millimeter x 100 mm Dorado balloon which was used  to angioplasty the venous portion of the AV access. Multiple inflations were performed inflation times with 30 seconds to 1 minute with maximum pressures of 16 ATM.  Follow-up imaging now demonstrated rapid flow of contrast through the graft and the graft was free of thrombus.    A 4-0 Monocryl purse-string suture was sewn around both of the sheaths.  The sheaths were removed and light pressure was applied.  A sterile bandage was applied to the puncture site.    COMPLICATIONS: None  CONDITION: Improved  Renford Dills, M.D [facility] Office: [phone]  11/28/2023 5:41 PM

## 2023-11-28 NOTE — Interval H&P Note (Signed)
 History and Physical Interval Note:  11/28/2023 5:37 PM  Joseph Gilmore  has presented today for surgery, with the diagnosis of L Arm Fistulagram   End Stage Renal.  The various methods of treatment have been discussed with the patient and family. After consideration of risks, benefits and other options for treatment, the patient has consented to  Procedure(s): A/V Fistulagram (Left) as a surgical intervention.  The patient's history has been reviewed, patient examined, no change in status, stable for surgery.  I have reviewed the patient's chart and labs.  Questions were answered to the patient's satisfaction.     Levora Dredge

## 2023-11-29 ENCOUNTER — Encounter: Payer: Self-pay | Admitting: Vascular Surgery

## 2023-11-29 DIAGNOSIS — N2581 Secondary hyperparathyroidism of renal origin: Secondary | ICD-10-CM | POA: Diagnosis not present

## 2023-11-29 DIAGNOSIS — Z992 Dependence on renal dialysis: Secondary | ICD-10-CM | POA: Diagnosis not present

## 2023-11-29 DIAGNOSIS — N186 End stage renal disease: Secondary | ICD-10-CM | POA: Diagnosis not present

## 2023-11-30 DIAGNOSIS — N186 End stage renal disease: Secondary | ICD-10-CM | POA: Diagnosis not present

## 2023-11-30 DIAGNOSIS — Z992 Dependence on renal dialysis: Secondary | ICD-10-CM | POA: Diagnosis not present

## 2023-11-30 DIAGNOSIS — N2581 Secondary hyperparathyroidism of renal origin: Secondary | ICD-10-CM | POA: Diagnosis not present

## 2023-12-01 ENCOUNTER — Emergency Department
Admission: EM | Admit: 2023-12-01 | Discharge: 2023-12-01 | Disposition: A | Discharge status: Home / Self Care | Attending: Student in an Organized Health Care Education/Training Program | Admitting: Student in an Organized Health Care Education/Training Program

## 2023-12-01 ENCOUNTER — Other Ambulatory Visit: Payer: Self-pay

## 2023-12-01 ENCOUNTER — Emergency Department

## 2023-12-01 DIAGNOSIS — R079 Chest pain, unspecified: Secondary | ICD-10-CM | POA: Diagnosis present

## 2023-12-01 DIAGNOSIS — N186 End stage renal disease: Secondary | ICD-10-CM | POA: Insufficient documentation

## 2023-12-01 DIAGNOSIS — Z992 Dependence on renal dialysis: Secondary | ICD-10-CM | POA: Diagnosis not present

## 2023-12-01 DIAGNOSIS — F840 Autistic disorder: Secondary | ICD-10-CM | POA: Insufficient documentation

## 2023-12-01 DIAGNOSIS — N2581 Secondary hyperparathyroidism of renal origin: Secondary | ICD-10-CM | POA: Diagnosis not present

## 2023-12-01 DIAGNOSIS — S299XXA Unspecified injury of thorax, initial encounter: Secondary | ICD-10-CM | POA: Diagnosis not present

## 2023-12-01 DIAGNOSIS — R0789 Other chest pain: Secondary | ICD-10-CM | POA: Insufficient documentation

## 2023-12-01 DIAGNOSIS — I120 Hypertensive chronic kidney disease with stage 5 chronic kidney disease or end stage renal disease: Secondary | ICD-10-CM | POA: Diagnosis not present

## 2023-12-01 MED ORDER — ACETAMINOPHEN 325 MG PO TABS
650.0000 mg | ORAL_TABLET | Freq: Once | ORAL | Status: AC
  Administered 2023-12-01: 650 mg via ORAL
  Filled 2023-12-01: qty 2
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
## 2023-12-01 NOTE — Discharge Instructions (Addendum)
 The EKG showed that your heart rate was elevated but was otherwise normal.  Chest x-ray was negative for fractures and other abnormalities.  You can take Tylenol as needed for pain.  650 mg every 6 hours.  Apply lidocaine patches, ice and heat as needed.  Return to the emergency department with significantly worsening pain, development of shortness of breath, abdominal pain or new bruising.
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
## 2023-12-05 DIAGNOSIS — N186 End stage renal disease: Secondary | ICD-10-CM | POA: Diagnosis not present

## 2023-12-05 DIAGNOSIS — Z992 Dependence on renal dialysis: Secondary | ICD-10-CM | POA: Diagnosis not present

## 2023-12-05 DIAGNOSIS — N2581 Secondary hyperparathyroidism of renal origin: Secondary | ICD-10-CM | POA: Diagnosis not present

## 2023-12-06 DIAGNOSIS — T8249XA Other complication of vascular dialysis catheter, initial encounter: Secondary | ICD-10-CM | POA: Diagnosis not present

## 2023-12-06 DIAGNOSIS — D631 Anemia in chronic kidney disease: Secondary | ICD-10-CM | POA: Diagnosis not present

## 2023-12-06 DIAGNOSIS — I120 Hypertensive chronic kidney disease with stage 5 chronic kidney disease or end stage renal disease: Secondary | ICD-10-CM | POA: Diagnosis not present

## 2023-12-06 DIAGNOSIS — Z4901 Encounter for fitting and adjustment of extracorporeal dialysis catheter: Secondary | ICD-10-CM | POA: Diagnosis not present

## 2023-12-06 DIAGNOSIS — N186 End stage renal disease: Secondary | ICD-10-CM | POA: Diagnosis not present

## 2023-12-06 DIAGNOSIS — T82828A Fibrosis of vascular prosthetic devices, implants and grafts, initial encounter: Secondary | ICD-10-CM | POA: Diagnosis not present

## 2023-12-07 DIAGNOSIS — N186 End stage renal disease: Secondary | ICD-10-CM | POA: Diagnosis not present

## 2023-12-07 DIAGNOSIS — N2581 Secondary hyperparathyroidism of renal origin: Secondary | ICD-10-CM | POA: Diagnosis not present

## 2023-12-07 DIAGNOSIS — Z992 Dependence on renal dialysis: Secondary | ICD-10-CM | POA: Diagnosis not present

## 2023-12-09 DIAGNOSIS — N2581 Secondary hyperparathyroidism of renal origin: Secondary | ICD-10-CM | POA: Diagnosis not present

## 2023-12-09 DIAGNOSIS — N186 End stage renal disease: Secondary | ICD-10-CM | POA: Diagnosis not present

## 2023-12-09 DIAGNOSIS — Z992 Dependence on renal dialysis: Secondary | ICD-10-CM | POA: Diagnosis not present

## 2023-12-12 DIAGNOSIS — Z992 Dependence on renal dialysis: Secondary | ICD-10-CM | POA: Diagnosis not present

## 2023-12-12 DIAGNOSIS — N2581 Secondary hyperparathyroidism of renal origin: Secondary | ICD-10-CM | POA: Diagnosis not present

## 2023-12-12 DIAGNOSIS — N186 End stage renal disease: Secondary | ICD-10-CM | POA: Diagnosis not present

## 2023-12-14 DIAGNOSIS — Z992 Dependence on renal dialysis: Secondary | ICD-10-CM | POA: Diagnosis not present

## 2023-12-14 DIAGNOSIS — N186 End stage renal disease: Secondary | ICD-10-CM | POA: Diagnosis not present

## 2023-12-14 DIAGNOSIS — N2581 Secondary hyperparathyroidism of renal origin: Secondary | ICD-10-CM | POA: Diagnosis not present

## 2023-12-16 DIAGNOSIS — Z992 Dependence on renal dialysis: Secondary | ICD-10-CM | POA: Diagnosis not present

## 2023-12-16 DIAGNOSIS — N2581 Secondary hyperparathyroidism of renal origin: Secondary | ICD-10-CM | POA: Diagnosis not present

## 2023-12-16 DIAGNOSIS — N186 End stage renal disease: Secondary | ICD-10-CM | POA: Diagnosis not present

## 2023-12-19 ENCOUNTER — Other Ambulatory Visit (INDEPENDENT_AMBULATORY_CARE_PROVIDER_SITE_OTHER): Payer: Self-pay | Admitting: Vascular Surgery

## 2023-12-19 DIAGNOSIS — Z992 Dependence on renal dialysis: Secondary | ICD-10-CM | POA: Diagnosis not present

## 2023-12-19 DIAGNOSIS — N2581 Secondary hyperparathyroidism of renal origin: Secondary | ICD-10-CM | POA: Diagnosis not present

## 2023-12-19 DIAGNOSIS — N186 End stage renal disease: Secondary | ICD-10-CM

## 2023-12-20 ENCOUNTER — Ambulatory Visit (INDEPENDENT_AMBULATORY_CARE_PROVIDER_SITE_OTHER): Admitting: Nurse Practitioner

## 2023-12-20 ENCOUNTER — Ambulatory Visit (INDEPENDENT_AMBULATORY_CARE_PROVIDER_SITE_OTHER)

## 2023-12-20 ENCOUNTER — Encounter (INDEPENDENT_AMBULATORY_CARE_PROVIDER_SITE_OTHER): Payer: Self-pay | Admitting: Nurse Practitioner

## 2023-12-20 VITALS — BP 134/87 | HR 87 | Resp 16

## 2023-12-20 DIAGNOSIS — N186 End stage renal disease: Secondary | ICD-10-CM

## 2023-12-20 DIAGNOSIS — I10 Essential (primary) hypertension: Secondary | ICD-10-CM

## 2023-12-21 DIAGNOSIS — Z992 Dependence on renal dialysis: Secondary | ICD-10-CM | POA: Diagnosis not present

## 2023-12-21 DIAGNOSIS — N186 End stage renal disease: Secondary | ICD-10-CM | POA: Diagnosis not present

## 2023-12-21 DIAGNOSIS — N2581 Secondary hyperparathyroidism of renal origin: Secondary | ICD-10-CM | POA: Diagnosis not present

## 2023-12-22 ENCOUNTER — Telehealth (INDEPENDENT_AMBULATORY_CARE_PROVIDER_SITE_OTHER): Payer: Self-pay

## 2023-12-22 ENCOUNTER — Ambulatory Visit (INDEPENDENT_AMBULATORY_CARE_PROVIDER_SITE_OTHER): Admitting: Nurse Practitioner

## 2023-12-22 NOTE — Telephone Encounter (Signed)
 Spoke with the patient and he is scheduled with Dr. Prescilla Brod on 12/26/23 with a 1:00 pm arrival time to the [facility] for a left arm fistulagram. Pre-procedure instructions were discussed and will be mailed.

## 2023-12-23 DIAGNOSIS — N2581 Secondary hyperparathyroidism of renal origin: Secondary | ICD-10-CM | POA: Diagnosis not present

## 2023-12-23 DIAGNOSIS — Z992 Dependence on renal dialysis: Secondary | ICD-10-CM | POA: Diagnosis not present

## 2023-12-23 DIAGNOSIS — N186 End stage renal disease: Secondary | ICD-10-CM | POA: Diagnosis not present

## 2023-12-25 ENCOUNTER — Encounter (INDEPENDENT_AMBULATORY_CARE_PROVIDER_SITE_OTHER): Payer: Self-pay | Admitting: Nurse Practitioner

## 2023-12-25 NOTE — H&P (View-Only) (Signed)
 Subjective:    Patient ID: Joseph Gilmore, male    DOB: May 16, 1971, 53 y.o.   MRN: [id number] Chief Complaint  Patient presents with   Follow-up    Add on base on u/s results    The patient is a 53 year old male who recently underwent placement of a left brachial axillary AV graft.    He has a Comptroller which she has been utilizing.  He recently underwent fistulogram as it was noted to be occluded.  Today noninvasive studies post fistulogram shows that his AV graft again is occluded with a flow volume of 106.  His father also notes he has been having some issues with his PermCath clotting.  He is noted to have significantly lower blood pressures postdialysis.    Review of Systems  All other systems reviewed and are negative.      Objective:   Physical Exam Vitals reviewed.  HENT:     Head: Normocephalic.  Cardiovascular:     Rate and Rhythm: Normal rate.  Pulmonary:     Effort: Pulmonary effort is normal.  Skin:    General: Skin is warm and dry.  Neurological:     Mental Status: He is alert. Mental status is at baseline.  Psychiatric:        Mood and Affect: Mood normal.        Behavior: Behavior normal.        Thought Content: Thought content normal.        Judgment: Judgment normal.     BP 134/87   Pulse 87   Resp 16   Past Medical History:  Diagnosis Date   Autism    Chronic kidney disease    ESRD (end stage renal disease) (HCC)    Hypertension    Murmur    OCD (obsessive compulsive disorder)     Social History   Socioeconomic History   Marital status: Single    Spouse name: Not on file   Number of children: Not on file   Years of education: Not on file   Highest education level: Not on file  Occupational History   Not on file  Tobacco Use   Smoking status: Never    Passive exposure: Never   Smokeless tobacco: Never  Vaping Use   Vaping status: Never Used  Substance and Sexual Activity   Alcohol use: Never   Drug use: Never   Sexual  activity: Not Currently  Other Topics Concern   Not on file  Social History Narrative   Lives with father .   Social Drivers of Corporate investment banker Strain: Not on file  Food Insecurity: No Food Insecurity (05/10/2022)   Hunger Vital Sign    Worried About Running Out of Food in the Last Year: Never true    Ran Out of Food in the Last Year: Never true  Transportation Needs: No Transportation Needs (05/10/2022)   PRAPARE - Administrator, Civil Service (Medical): No    Lack of Transportation (Non-Medical): No  Physical Activity: Not on file  Stress: Not on file  Social Connections: Not on file  Intimate Partner Violence: Not on file    Past Surgical History:  Procedure Laterality Date   A/V FISTULAGRAM Left 10/07/2020   Procedure: A/V FISTULAGRAM;  Surgeon: Jackquelyn Mass, MD;  Location: [facility];  Service: Cardiovascular;  Laterality: Left;   A/V FISTULAGRAM Left 02/15/2023   Procedure: A/V Fistulagram;  Surgeon: Jackquelyn Mass,  MD;  Location: [facility];  Service: Cardiovascular;  Laterality: Left;   A/V FISTULAGRAM Left 11/28/2023   Procedure: A/V Fistulagram;  Surgeon: Jackquelyn Mass, MD;  Location: [facility];  Service: Cardiovascular;  Laterality: Left;   AV FISTULA PLACEMENT Left 07/15/2020   Procedure: ARTERIOVENOUS FISTULA CREATION;  Surgeon: Jackquelyn Mass, MD;  Location: [facility];  Service: Vascular;  Laterality: Left;   AV FISTULA PLACEMENT Left 10/04/2023   Procedure: INSERTION OF ARTERIOVENOUS (AV) GORE-TEX GRAFT ARM (BRACHIAL AXILLARY);  Surgeon: Jackquelyn Mass, MD;  Location: [facility];  Service: Vascular;  Laterality: Left;   DIALYSIS/PERMA CATHETER INSERTION N/A 04/06/2020   Procedure: DIALYSIS/PERMA CATHETER INSERTION;  Surgeon: Celso College, MD;  Location: [facility];  Service: Cardiovascular;  Laterality: N/A;   DIALYSIS/PERMA CATHETER INSERTION N/A 05/26/2020   Procedure: DIALYSIS/PERMA  CATHETER INSERTION;  Surgeon: Jackquelyn Mass, MD;  Location: [facility];  Service: Cardiovascular;  Laterality: N/A;   DIALYSIS/PERMA CATHETER INSERTION N/A 07/09/2020   Procedure: DIALYSIS/PERMA CATHETER INSERTION;  Surgeon: Celso College, MD;  Location: [facility];  Service: Cardiovascular;  Laterality: N/A;   DIALYSIS/PERMA CATHETER INSERTION N/A 01/08/2021   Procedure: DIALYSIS/PERMA CATHETER INSERTION;  Surgeon: Celso College, MD;  Location: [facility];  Service: Cardiovascular;  Laterality: N/A;   DIALYSIS/PERMA CATHETER INSERTION N/A 03/17/2023   Procedure: DIALYSIS/PERMA CATHETER INSERTION;  Surgeon: Jackquelyn Mass, MD;  Location: [facility];  Service: Cardiovascular;  Laterality: N/A;   DIALYSIS/PERMA CATHETER REMOVAL N/A 03/15/2021   Procedure: DIALYSIS/PERMA CATHETER REMOVAL;  Surgeon: Celso College, MD;  Location: [facility];  Service: Cardiovascular;  Laterality: N/A;   REVISON OF ARTERIOVENOUS FISTULA Left 12/11/2020   Procedure: ARTERIOVENOUS (AV) FISTULA CREATION ( BRACHIAL CEPHALIC );  Surgeon: Jackquelyn Mass, MD;  Location: [facility];  Service: Vascular;  Laterality: Left;   TEMPORARY DIALYSIS CATHETER N/A 03/15/2023   Procedure: TEMPORARY DIALYSIS CATHETER;  Surgeon: Jackquelyn Mass, MD;  Location: [facility];  Service: Cardiovascular;  Laterality: N/A;    History reviewed. No pertinent family history.  Allergies  Allergen Reactions   Chlorhexidine  Other (See Comments)    Blisters (topical)       Latest Ref Rng & Units 10/04/2023    2:33 PM 09/29/2023    2:22 PM 03/16/2023    3:07 AM  CBC  WBC 4.0 - 10.5 K/uL  12.7  9.0   Hemoglobin 13.0 - 17.0 g/dL 9.2  8.9  9.8   Hematocrit 39.0 - 52.0 % 27.0  27.2  29.6   Platelets 150 - 400 K/uL  180  185       CMP     Component Value Date/Time   NA 139 10/04/2023 1433   K 4.5 10/04/2023 1433   CL 101 10/04/2023 1433   CO2 22 09/29/2023 1422   GLUCOSE 88  10/04/2023 1433   BUN 41 (H) 10/04/2023 1433   CREATININE >18.00 (H) 10/04/2023 1433   CALCIUM  8.8 (L) 09/29/2023 1422   PROT 6.5 03/16/2023 0307   ALBUMIN 3.2 (L) 03/16/2023 0307   AST 15 03/16/2023 0307   ALT 13 03/16/2023 0307   ALKPHOS 50 03/16/2023 0307   BILITOT 0.6 03/16/2023 0307   GFRNONAA 5 (L) 09/29/2023 1422     No results found.     Assessment & Plan:   1. ESRD on hemodialysis (HCC) (Primary) Recommend:  The patient's cough is occluded at  the anastomosis.  It has not been used yet.  He will still utilizes PermCath.  We discussed that the fistulogram is attempt to establish the access and if it is unable to be declotted we may require a new upper extremity access.  The risks, benefits and alternative therapies were reviewed in detail with the patient.  All questions were answered.  The patient agrees to proceed with angio/intervention.    The patient will follow up with me in the office after the procedure.   2. Essential hypertension Continue antihypertensive medications as already ordered, these medications have been reviewed and there are no changes at this time.   Current Outpatient Medications on File Prior to Visit  Medication Sig Dispense Refill   Cholecalciferol (VITAMIN D -3) 125 MCG (5000 UT) TABS Take 5,000 Units by mouth daily.     dutasteride  (AVODART ) 0.5 MG capsule TAKE ONE CAPSULE BY MOUTH DAILY 90 capsule 3   HYDROcodone -acetaminophen  (NORCO) 5-325 MG tablet Take 1-2 tablets by mouth every 6 (six) hours as needed for moderate pain (pain score 4-6) or severe pain (pain score 7-10). 30 tablet 0   midodrine (PROAMATINE) 2.5 MG tablet Take 2.5 mg by mouth See admin instructions. Take 2.5 mg by mouth on Tuesday, Thursday and Saturday at dialysis as needed for low blood pressure.     multivitamin (RENA-VIT) TABS tablet Take by mouth.     tamsulosin  (FLOMAX ) 0.4 MG CAPS capsule TAKE ONE CAPSULE BY MOUTH DAILY 90 capsule 3   calcium  acetate (PHOSLO ) 667 MG  capsule Take 667 mg by mouth 3 (three) times daily with meals. (Patient not taking: Reported on 12/20/2023)     Methoxy PEG-Epoetin  Beta (MIRCERA IJ) Mircera (Patient not taking: Reported on 09/29/2023)     No current facility-administered medications on file prior to visit.    There are no Patient Instructions on file for this visit. No follow-ups on file.   Tion Tse E Addysin Porco, NP

## 2023-12-25 NOTE — Progress Notes (Signed)
 Subjective:    Patient ID: Joseph Gilmore, male    DOB: May 16, 1971, 53 y.o.   MRN: [id number] Chief Complaint  Patient presents with   Follow-up    Add on base on u/s results    The patient is a 53 year old male who recently underwent placement of a left brachial axillary AV graft.    He has a Comptroller which she has been utilizing.  He recently underwent fistulogram as it was noted to be occluded.  Today noninvasive studies post fistulogram shows that his AV graft again is occluded with a flow volume of 106.  His father also notes he has been having some issues with his PermCath clotting.  He is noted to have significantly lower blood pressures postdialysis.    Review of Systems  All other systems reviewed and are negative.      Objective:   Physical Exam Vitals reviewed.  HENT:     Head: Normocephalic.  Cardiovascular:     Rate and Rhythm: Normal rate.  Pulmonary:     Effort: Pulmonary effort is normal.  Skin:    General: Skin is warm and dry.  Neurological:     Mental Status: He is alert. Mental status is at baseline.  Psychiatric:        Mood and Affect: Mood normal.        Behavior: Behavior normal.        Thought Content: Thought content normal.        Judgment: Judgment normal.     BP 134/87   Pulse 87   Resp 16   Past Medical History:  Diagnosis Date   Autism    Chronic kidney disease    ESRD (end stage renal disease) (HCC)    Hypertension    Murmur    OCD (obsessive compulsive disorder)     Social History   Socioeconomic History   Marital status: Single    Spouse name: Not on file   Number of children: Not on file   Years of education: Not on file   Highest education level: Not on file  Occupational History   Not on file  Tobacco Use   Smoking status: Never    Passive exposure: Never   Smokeless tobacco: Never  Vaping Use   Vaping status: Never Used  Substance and Sexual Activity   Alcohol use: Never   Drug use: Never   Sexual  activity: Not Currently  Other Topics Concern   Not on file  Social History Narrative   Lives with father .   Social Drivers of Corporate investment banker Strain: Not on file  Food Insecurity: No Food Insecurity (05/10/2022)   Hunger Vital Sign    Worried About Running Out of Food in the Last Year: Never true    Ran Out of Food in the Last Year: Never true  Transportation Needs: No Transportation Needs (05/10/2022)   PRAPARE - Administrator, Civil Service (Medical): No    Lack of Transportation (Non-Medical): No  Physical Activity: Not on file  Stress: Not on file  Social Connections: Not on file  Intimate Partner Violence: Not on file    Past Surgical History:  Procedure Laterality Date   A/V FISTULAGRAM Left 10/07/2020   Procedure: A/V FISTULAGRAM;  Surgeon: Jackquelyn Mass, MD;  Location: [facility];  Service: Cardiovascular;  Laterality: Left;   A/V FISTULAGRAM Left 02/15/2023   Procedure: A/V Fistulagram;  Surgeon: Jackquelyn Mass,  MD;  Location: [facility];  Service: Cardiovascular;  Laterality: Left;   A/V FISTULAGRAM Left 11/28/2023   Procedure: A/V Fistulagram;  Surgeon: Jackquelyn Mass, MD;  Location: [facility];  Service: Cardiovascular;  Laterality: Left;   AV FISTULA PLACEMENT Left 07/15/2020   Procedure: ARTERIOVENOUS FISTULA CREATION;  Surgeon: Jackquelyn Mass, MD;  Location: [facility];  Service: Vascular;  Laterality: Left;   AV FISTULA PLACEMENT Left 10/04/2023   Procedure: INSERTION OF ARTERIOVENOUS (AV) GORE-TEX GRAFT ARM (BRACHIAL AXILLARY);  Surgeon: Jackquelyn Mass, MD;  Location: [facility];  Service: Vascular;  Laterality: Left;   DIALYSIS/PERMA CATHETER INSERTION N/A 04/06/2020   Procedure: DIALYSIS/PERMA CATHETER INSERTION;  Surgeon: Celso College, MD;  Location: [facility];  Service: Cardiovascular;  Laterality: N/A;   DIALYSIS/PERMA CATHETER INSERTION N/A 05/26/2020   Procedure: DIALYSIS/PERMA  CATHETER INSERTION;  Surgeon: Jackquelyn Mass, MD;  Location: [facility];  Service: Cardiovascular;  Laterality: N/A;   DIALYSIS/PERMA CATHETER INSERTION N/A 07/09/2020   Procedure: DIALYSIS/PERMA CATHETER INSERTION;  Surgeon: Celso College, MD;  Location: [facility];  Service: Cardiovascular;  Laterality: N/A;   DIALYSIS/PERMA CATHETER INSERTION N/A 01/08/2021   Procedure: DIALYSIS/PERMA CATHETER INSERTION;  Surgeon: Celso College, MD;  Location: [facility];  Service: Cardiovascular;  Laterality: N/A;   DIALYSIS/PERMA CATHETER INSERTION N/A 03/17/2023   Procedure: DIALYSIS/PERMA CATHETER INSERTION;  Surgeon: Jackquelyn Mass, MD;  Location: [facility];  Service: Cardiovascular;  Laterality: N/A;   DIALYSIS/PERMA CATHETER REMOVAL N/A 03/15/2021   Procedure: DIALYSIS/PERMA CATHETER REMOVAL;  Surgeon: Celso College, MD;  Location: [facility];  Service: Cardiovascular;  Laterality: N/A;   REVISON OF ARTERIOVENOUS FISTULA Left 12/11/2020   Procedure: ARTERIOVENOUS (AV) FISTULA CREATION ( BRACHIAL CEPHALIC );  Surgeon: Jackquelyn Mass, MD;  Location: [facility];  Service: Vascular;  Laterality: Left;   TEMPORARY DIALYSIS CATHETER N/A 03/15/2023   Procedure: TEMPORARY DIALYSIS CATHETER;  Surgeon: Jackquelyn Mass, MD;  Location: [facility];  Service: Cardiovascular;  Laterality: N/A;    History reviewed. No pertinent family history.  Allergies  Allergen Reactions   Chlorhexidine  Other (See Comments)    Blisters (topical)       Latest Ref Rng & Units 10/04/2023    2:33 PM 09/29/2023    2:22 PM 03/16/2023    3:07 AM  CBC  WBC 4.0 - 10.5 K/uL  12.7  9.0   Hemoglobin 13.0 - 17.0 g/dL 9.2  8.9  9.8   Hematocrit 39.0 - 52.0 % 27.0  27.2  29.6   Platelets 150 - 400 K/uL  180  185       CMP     Component Value Date/Time   NA 139 10/04/2023 1433   K 4.5 10/04/2023 1433   CL 101 10/04/2023 1433   CO2 22 09/29/2023 1422   GLUCOSE 88  10/04/2023 1433   BUN 41 (H) 10/04/2023 1433   CREATININE >18.00 (H) 10/04/2023 1433   CALCIUM  8.8 (L) 09/29/2023 1422   PROT 6.5 03/16/2023 0307   ALBUMIN 3.2 (L) 03/16/2023 0307   AST 15 03/16/2023 0307   ALT 13 03/16/2023 0307   ALKPHOS 50 03/16/2023 0307   BILITOT 0.6 03/16/2023 0307   GFRNONAA 5 (L) 09/29/2023 1422     No results found.     Assessment & Plan:   1. ESRD on hemodialysis (HCC) (Primary) Recommend:  The patient's cough is occluded at  the anastomosis.  It has not been used yet.  He will still utilizes PermCath.  We discussed that the fistulogram is attempt to establish the access and if it is unable to be declotted we may require a new upper extremity access.  The risks, benefits and alternative therapies were reviewed in detail with the patient.  All questions were answered.  The patient agrees to proceed with angio/intervention.    The patient will follow up with me in the office after the procedure.   2. Essential hypertension Continue antihypertensive medications as already ordered, these medications have been reviewed and there are no changes at this time.   Current Outpatient Medications on File Prior to Visit  Medication Sig Dispense Refill   Cholecalciferol (VITAMIN D -3) 125 MCG (5000 UT) TABS Take 5,000 Units by mouth daily.     dutasteride  (AVODART ) 0.5 MG capsule TAKE ONE CAPSULE BY MOUTH DAILY 90 capsule 3   HYDROcodone -acetaminophen  (NORCO) 5-325 MG tablet Take 1-2 tablets by mouth every 6 (six) hours as needed for moderate pain (pain score 4-6) or severe pain (pain score 7-10). 30 tablet 0   midodrine (PROAMATINE) 2.5 MG tablet Take 2.5 mg by mouth See admin instructions. Take 2.5 mg by mouth on Tuesday, Thursday and Saturday at dialysis as needed for low blood pressure.     multivitamin (RENA-VIT) TABS tablet Take by mouth.     tamsulosin  (FLOMAX ) 0.4 MG CAPS capsule TAKE ONE CAPSULE BY MOUTH DAILY 90 capsule 3   calcium  acetate (PHOSLO ) 667 MG  capsule Take 667 mg by mouth 3 (three) times daily with meals. (Patient not taking: Reported on 12/20/2023)     Methoxy PEG-Epoetin  Beta (MIRCERA IJ) Mircera (Patient not taking: Reported on 09/29/2023)     No current facility-administered medications on file prior to visit.    There are no Patient Instructions on file for this visit. No follow-ups on file.   Tion Tse E Addysin Porco, NP

## 2023-12-26 ENCOUNTER — Other Ambulatory Visit: Payer: Self-pay

## 2023-12-26 ENCOUNTER — Ambulatory Visit
Admission: RE | Admit: 2023-12-26 | Discharge: 2023-12-26 | Disposition: A | Discharge status: Home / Self Care | Attending: Vascular Surgery | Admitting: Vascular Surgery

## 2023-12-26 ENCOUNTER — Encounter
Admission: RE | Disposition: A | Discharge status: Home / Self Care | Payer: Self-pay | Source: Home / Self Care | Attending: Vascular Surgery

## 2023-12-26 ENCOUNTER — Encounter: Payer: Self-pay | Admitting: Vascular Surgery

## 2023-12-26 DIAGNOSIS — Z9889 Other specified postprocedural states: Secondary | ICD-10-CM | POA: Diagnosis not present

## 2023-12-26 DIAGNOSIS — I120 Hypertensive chronic kidney disease with stage 5 chronic kidney disease or end stage renal disease: Secondary | ICD-10-CM | POA: Insufficient documentation

## 2023-12-26 DIAGNOSIS — T82868A Thrombosis of vascular prosthetic devices, implants and grafts, initial encounter: Secondary | ICD-10-CM | POA: Diagnosis not present

## 2023-12-26 DIAGNOSIS — Z992 Dependence on renal dialysis: Secondary | ICD-10-CM | POA: Insufficient documentation

## 2023-12-26 DIAGNOSIS — Y832 Surgical operation with anastomosis, bypass or graft as the cause of abnormal reaction of the patient, or of later complication, without mention of misadventure at the time of the procedure: Secondary | ICD-10-CM | POA: Diagnosis not present

## 2023-12-26 DIAGNOSIS — T85858A Stenosis due to other internal prosthetic devices, implants and grafts, initial encounter: Secondary | ICD-10-CM | POA: Diagnosis not present

## 2023-12-26 DIAGNOSIS — N186 End stage renal disease: Secondary | ICD-10-CM | POA: Insufficient documentation

## 2023-12-26 HISTORY — PX: A/V FISTULAGRAM: CATH118298

## 2023-12-26 LAB — POTASSIUM (ARMC VASCULAR LAB ONLY): Potassium (ARMC vascular lab): 4.1 mmol/L (ref 3.5–5.1)

## 2023-12-26 SURGERY — A/V FISTULAGRAM
Anesthesia: Moderate Sedation | Laterality: Left

## 2023-12-26 MED ORDER — METHYLPREDNISOLONE SODIUM SUCC 125 MG IJ SOLR: 125.0000 mg | Freq: Once | INTRAMUSCULAR | Status: DC | PRN

## 2023-12-26 MED ORDER — NITROGLYCERIN 1 MG/10 ML FOR IR/CATH LAB
INTRA_ARTERIAL | Status: DC | PRN
  Administered 2023-12-26: 300 ug via INTRA_ARTERIAL

## 2023-12-26 MED ORDER — HEPARIN (PORCINE) IN NACL 1000-0.9 UT/500ML-% IV SOLN
INTRAVENOUS | Status: DC | PRN
  Administered 2023-12-26 (×2): 500 mL

## 2023-12-26 MED ORDER — SODIUM CHLORIDE 0.9 % IV SOLN: INTRAVENOUS | Status: DC

## 2023-12-26 MED ORDER — HEPARIN SODIUM (PORCINE) 1000 UNIT/ML IJ SOLN
INTRAMUSCULAR | Status: AC
  Filled 2023-12-26: qty 10

## 2023-12-26 MED ORDER — HEPARIN SODIUM (PORCINE) 1000 UNIT/ML IJ SOLN
INTRAMUSCULAR | Status: DC | PRN
  Administered 2023-12-26: 6000 [IU] via INTRAVENOUS

## 2023-12-26 MED ORDER — FENTANYL CITRATE PF 50 MCG/ML IJ SOSY
PREFILLED_SYRINGE | INTRAMUSCULAR | Status: AC
  Filled 2023-12-26: qty 1

## 2023-12-26 MED ORDER — IODIXANOL 320 MG/ML IV SOLN
INTRAVENOUS | Status: DC | PRN
  Administered 2023-12-26: 110 mL

## 2023-12-26 MED ORDER — DIPHENHYDRAMINE HCL 50 MG/ML IJ SOLN: 50.0000 mg | Freq: Once | INTRAMUSCULAR | Status: DC | PRN

## 2023-12-26 MED ORDER — MIDAZOLAM HCL 2 MG/2ML IJ SOLN
INTRAMUSCULAR | Status: DC | PRN
  Administered 2023-12-26: 1 mg via INTRAVENOUS
  Administered 2023-12-26: 2 mg via INTRAVENOUS

## 2023-12-26 MED ORDER — FENTANYL CITRATE (PF) 100 MCG/2ML IJ SOLN
INTRAMUSCULAR | Status: DC | PRN
  Administered 2023-12-26 (×2): 25 ug via INTRAVENOUS
  Administered 2023-12-26: 50 ug via INTRAVENOUS

## 2023-12-26 MED ORDER — HYDROMORPHONE HCL 1 MG/ML IJ SOLN: 1.0000 mg | Freq: Once | INTRAMUSCULAR | Status: DC | PRN

## 2023-12-26 MED ORDER — CEFAZOLIN SODIUM-DEXTROSE 1-4 GM/50ML-% IV SOLN
INTRAVENOUS | Status: AC
Start: 2023-12-26 — End: ?
  Filled 2023-12-26: qty 50

## 2023-12-26 MED ORDER — FAMOTIDINE 20 MG PO TABS: 40.0000 mg | ORAL_TABLET | Freq: Once | ORAL | Status: DC | PRN

## 2023-12-26 MED ORDER — CEFAZOLIN SODIUM-DEXTROSE 1-4 GM/50ML-% IV SOLN
1.0000 g | INTRAVENOUS | Status: AC
  Administered 2023-12-26: 1 g via INTRAVENOUS

## 2023-12-26 MED ORDER — MIDAZOLAM HCL 2 MG/ML PO SYRP: 8.0000 mg | ORAL_SOLUTION | Freq: Once | ORAL | Status: DC | PRN

## 2023-12-26 MED ORDER — LIDOCAINE HCL (PF) 1 % IJ SOLN
INTRAMUSCULAR | Status: DC | PRN
  Administered 2023-12-26: 5 mL

## 2023-12-26 MED ORDER — MIDAZOLAM HCL 2 MG/2ML IJ SOLN
INTRAMUSCULAR | Status: AC
  Filled 2023-12-26: qty 2

## 2023-12-26 MED ORDER — ONDANSETRON HCL 4 MG/2ML IJ SOLN: 4.0000 mg | Freq: Four times a day (QID) | INTRAMUSCULAR | Status: DC | PRN

## 2023-12-26 SURGICAL SUPPLY — 23 items
BALLOON ARMADA 14X40X80 (BALLOONS) IMPLANT
BALLOON LUTONIX DCB 5X40X130 (BALLOONS) IMPLANT
BALLOON LUTONIX DCB 6X40X130 (BALLOONS) IMPLANT
BALLOON ULTRVRSE 10X40X75 (BALLOONS) IMPLANT
CATH BEACON 5 .035 65 KMP TIP (CATHETERS) IMPLANT
CATH EMBOLECTOMY 5FR (BALLOONS) IMPLANT
CATH THROMBEC 7F 65 CLEANER15 (CATHETERS) IMPLANT
COVER PROBE ULTRASOUND 5X96 (MISCELLANEOUS) IMPLANT
DEVICE PRESTO INFLATION (MISCELLANEOUS) IMPLANT
DRAPE BRACHIAL (DRAPES) IMPLANT
GLIDEWIRE ADV .035X260CM (WIRE) IMPLANT
GOWN STRL REUS W/ TWL LRG LVL3 (GOWN DISPOSABLE) ×1 IMPLANT
GUIDEWIRE ANGLED .035 180CM (WIRE) IMPLANT
NDL ENTRY 21GA 7CM ECHOTIP (NEEDLE) IMPLANT
NEEDLE ENTRY 21GA 7CM ECHOTIP (NEEDLE) ×1 IMPLANT
PACK ANGIOGRAPHY (CUSTOM PROCEDURE TRAY) ×1 IMPLANT
SET INTRO CAPELLA COAXIAL (SET/KITS/TRAYS/PACK) IMPLANT
SHEATH BRITE TIP 6FRX5.5 (SHEATH) IMPLANT
SHEATH BRITE TIP 7FRX5.5 (SHEATH) IMPLANT
STENT COVERA FLARED 9X60X80 (Permanent Stent) IMPLANT
STENT VENOVO 18X60X80 (Permanent Stent) IMPLANT
SUT MNCRL AB 4-0 PS2 18 (SUTURE) IMPLANT
WIRE SUPRACORE 190CM (WIRE) IMPLANT

## 2023-12-26 NOTE — Progress Notes (Signed)
 Telephone call to patient's father   I did not get the opportunity to talk to Joseph Gilmore father after the procedure.  I did call him just now and spoke with him on the phone to let him know that we were able to get his graft open I placed 2 more stents and balloon to a third area.  I told him I was cautiously optimistic that we we will be able to use his graft and let him know that they should use his catheter for 2 more weeks and then they can begin cannulation.

## 2023-12-26 NOTE — Interval H&P Note (Signed)
 History and Physical Interval Note:  12/26/2023 2:32 PM  Joseph Gilmore  has presented today for surgery, with the diagnosis of L arm fistulagram    End Stage Renal.  The various methods of treatment have been discussed with the patient and family. After consideration of risks, benefits and other options for treatment, the patient has consented to  Procedure(s): A/V Fistulagram (Left) as a surgical intervention.  The patient's history has been reviewed, patient examined, no change in status, stable for surgery.  I have reviewed the patient's chart and labs.  Questions were answered to the patient's satisfaction.     Devon Fogo

## 2023-12-26 NOTE — Discharge Instructions (Signed)
 Fistulagram, Care After Refer to this sheet in the next few weeks. These instructions provide you with information on caring for yourself after your procedure. Your health care provider may also give you more specific instructions. Your treatment has been planned according to current medical practices, but problems sometimes occur. Call your health care provider if you have any problems or questions after your procedure. What can I expect after the procedure? After your procedure, it is typical to have the following: A small amount of discomfort in the area where the catheters were placed. A small amount of bruising around the fistula. Sleepiness and fatigue.  Follow these instructions at home: Rest at home for the day following your procedure. Do not drive or operate heavy machinery while taking pain medicine. Take medicines only as directed by your health care provider. Do not take baths, swim, or use a hot tub until your health care provider approves. You may shower 24 hours after the procedure or as directed by your health care provider. There are many different ways to close and cover an incision, including stitches, skin glue, and adhesive strips. Follow your health care provider's instructions on: Incision care. Bandage (dressing) changes and removal. Incision closure removal. Monitor your dialysis fistula carefully. Contact a health care provider if: You have drainage, redness, swelling, or pain at your catheter site. You have a fever. You have chills. Get help right away if: You feel weak. You have trouble balancing. You have trouble moving your arms or legs. You have problems with your speech or vision. You can no longer feel a vibration or buzz when you put your fingers over your dialysis fistula. The limb that was used for the procedure: Swells. Is painful. Is cold. Is discolored, such as blue or pale white. This information is not intended to replace advice given to you  by your health care provider. Make sure you discuss any questions you have with your health care provider. Document Released: 12/30/2013 Document Revised: 01/21/2016 Document Reviewed: 10/04/2013 Elsevier Interactive Patient Education  2018 [employer]Shuntogram, Care After Refer to this sheet in the next few weeks. These instructions provide you with information on caring for yourself after your procedure. Your health care provider may also give you more specific instructions. Your treatment has been planned according to current medical practices, but problems sometimes occur. Call your health care provider if you have any problems or questions after your procedure. What can I expect after the procedure? After your procedure, it is typical to have the following: A small amount of discomfort in the area where the catheters were placed. A small amount of bruising around the fistula. Sleepiness and fatigue.  Follow these instructions at home: Rest at home for the day following your procedure. Do not drive or operate heavy machinery while taking pain medicine. Take medicines only as directed by your health care provider. Do not take baths, swim, or use a hot tub until your health care provider approves. You may shower 24 hours after the procedure or as directed by your health care provider. There are many different ways to close and cover an incision, including stitches, skin glue, and adhesive strips. Follow your health care provider's instructions on: Incision care. Bandage (dressing) changes and removal. Incision closure removal. Monitor your dialysis fistula carefully. Contact a health care provider if: You have drainage, redness, swelling, or pain at your catheter site. You have a fever. You have chills. Get help right away if: You feel weak. You have trouble  balancing. You have trouble moving your arms or legs. You have problems with your speech or vision. You can no longer feel a  vibration or buzz when you put your fingers over your dialysis fistula. The limb that was used for the procedure: Swells. Is painful. Is cold. Is discolored, such as blue or pale white. This information is not intended to replace advice given to you by your health care provider. Make sure you discuss any questions you have with your health care provider. Document Released: 12/30/2013 Document Revised: 01/21/2016 Document Reviewed: 10/04/2013 Elsevier Interactive Patient Education  2018 [employer].

## 2023-12-26 NOTE — Op Note (Signed)
 OPERATIVE NOTE   PROCEDURE: Contrast injection left arm brachial axillary AV graft Mechanical thrombectomy left arm brachial axillary AV graft Percutaneous transluminal angioplasty of the peripheral segment at the level of the arterial anastomosis to 6 mm. Percutaneous transluminal angioplasty and stent placement distal left subclavian vein. Percutaneous transluminal angioplasty and stent placement proximal left subclavian vein  PRE-OPERATIVE DIAGNOSIS: Complication of dialysis access                                                       End Stage Renal Disease  POST-OPERATIVE DIAGNOSIS: same as above   SURGEON: Jackquelyn Mass, M.D.  ANESTHESIA: Conscious sedation was administered by the radiology RN under my direct supervision. IV Versed  plus fentanyl  were utilized. Continuous ECG, pulse oximetry and blood pressure was monitored throughout the entire procedure. Conscious sedation was for a total of 1 hour 57 minutes 35 seconds.    ESTIMATED BLOOD LOSS: minimal  FINDING(S): Thrombus noted in the graft.  Once this was extracted there was an occlusive lesion at the distal edge of the stent in the subclavian.  I also identified a subclavian lesion that was in the proximal subclavian very close to the confluence of the subclavian and jugular veins.  Spasm was noted in the brachial artery.  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  Catheter is noted in good position.  The innominate vein and superior vena cava appear patent  SPECIMEN(S):  None  CONTRAST: 110 cc  FLUOROSCOPY TIME: 16.1 minutes  INDICATIONS: Joseph Gilmore is a 53 y.o. male who  presents with thrombosed AV access.  The patient is scheduled for angiography with possible intervention of the AV access.  The patient is aware the risks include but are not limited to: bleeding, infection, thrombosis of the cannulated access, and possible anaphylactic reaction to the contrast.  The patient acknowledges  if the access can not be salvaged a tunneled catheter will be needed and will be placed during this procedure.  The patient is aware of the risks of the procedure and elects to proceed with the angiogram and intervention.  DESCRIPTION: After full informed written consent was obtained, the patient was brought back to the Special Procedure suite and placed supine position.  Appropriate cardiopulmonary monitors were placed.  The left arm was prepped and draped in the standard fashion.  Appropriate timeout is called. The left brachial axillary graft was cannulated with a micropuncture needle using ultrasound guidance.  With the ultrasound the AV access appeared to be filled with heterogeneous material and was poorly compressible indicating thrombosis of the AV access. The puncture was made under direct ultrasound visualization and an image was recorded for the permanent record.  The microwire was advanced and the needle was exchanged for  a microsheath.  The J-wire was then advanced and a 7 Fr sheath inserted.  Hand was then performed which demonstrated thrombus within the AV access.  The central venous structures were also imaged by hand injections.  6000 units of heparin  was given and allowed to circulate as well.  A cleaner device was then advanced beginning centrally and pulling back performing.  Several passes were made through the venous portion of the graft. Follow-up imaging now demonstrates the vast majority of the clot had been treated. Therefore a retrograde sheath was  inserted. This was a 6 [place] sheath initially later upsized to a 7 [place] sheath and was positioned more proximally on the arm and angled in the retrograde direction. The left arm AV graft was cannulated with a micropuncture needle using ultrasound guidance.  With the ultrasound the AV access appeared to be filled with heterogeneous material and was poorly compressible indicating thrombosis of the AV access. The puncture was made under  direct ultrasound visualization and an image was recorded for the permanent record.  Subsequently a floppy Glidewire and a KMP catheter were negotiated into the arterial system hand injection contrast was then utilized to demonstrate patency of the artery as well as the location for the anastomosis.  Initially after obtaining the imaging through the Kumpe catheter I negotiated the catheter and then the floppy Glidewire so that it was going distally.  A 6 French over-the-wire Fogarty balloon catheter was then advanced so that it was just at the level of the anastomosis and an area that I had confirmed was free of thrombus he was then used to try to pull the thrombus into the mid graft where he could be treated with the cleaner.  Several passes were made however residual lesion was still noted.  The wire was reintroduced and a 5 mm x 40 mm balloon was used to treat the arterial anastomotic site.  Follow-up imaging through a Kumpe did not show significant improvement and therefore the cleaner device was now advanced through the retrograde sheath was extended out into the artery the device was opened and repositioned into the graft and then the device was engaged right at this area of narrowing.  Follow-up imaging through the Kumpe demonstrated resolution of this area with patency of the graft and less than 10% residual stenosis.  Attention was then turned back to the venous outflow where imaging was redone showing what appeared to be a large almost occlusive lesion right at the edge of the previously placed stent.  A supra core wire was then advanced into the superior vena cava.  A 10 mm x 40 mm balloon was then used to angioplasty this lesion.  Also noted on the initial imaging in the central venous system was a lesion in the subclavian vein almost at the confluence of the subclavian with the jugular vein.  This lesion when I reviewed the images from earlier this month was present.  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  Therefore I advanced the 10 mm balloon across this lesion and inflated it to 8 atm for 1 minute.  Follow-up imaging did not demonstrate significant improvement at this level.  Follow-up imaging of the stent in the distal subclavian did not demonstrate any improvement.  Therefore I selected a 9 x 60 flared Covera and deployed this extending the stents to the level of the mid subclavian.  I then used a 14 mm x 40 mm balloon to post dilate this inflation was to 4 atm.  I then advanced the 14 mm balloon across the proximal subclavian lesion and inflated it to 8 atm for 1 minute.  Follow-up imaging did not demonstrate improvement in the proximal subclavian lesion.  A 18 mm x 40 mm Fenovar was then selected and deployed across this lesion and postdilated with a 14 mm balloon.  There now appears to be resolution of this lesion and flow of contrast through the entire system.  However it did appear that contrast was just sitting in the AV graft itself suggesting an inflow lesion.  I once again using the floppy Glidewire and a Kumpe in the retrograde sheath negotiated the Kumpe catheter into the brachial artery hand-injection of contrast demonstrated a greater than 80% stenosis in the previous location that have been treated.  This also happens to be right at the point that the 7 French antegrade sheath is inserted I suspect this is an issue with sheath insertion compromising the graft.  And this was clearly flow-limiting and compromising forward flow.  I therefore placed a pursestring suture of 4-0 Monocryl around the antegrade sheath and removed it.  Working over the wire through the retrograde sheath a 6 mm x 40 mm balloon was advanced across this area inflated to 10 atm for approximately 1 minute.  The  Kumpe catheter was then reintroduced back into the brachial artery and hand-injection of contrast now demonstrated rapid flow through the AV graft with complete and fast washing out of contrast.  A 4-0 Monocryl purse-string suture was sewn around retrograde sheath.  The sheath was removed and light pressure was applied.  A sterile bandage was applied to the puncture site.    COMPLICATIONS: None  CONDITION: Improved  Jackquelyn Mass, M.D [facility] Office: [phone]  12/26/2023 4:46 PM

## 2023-12-27 ENCOUNTER — Encounter: Payer: Self-pay | Admitting: Vascular Surgery

## 2023-12-27 DIAGNOSIS — N186 End stage renal disease: Secondary | ICD-10-CM | POA: Diagnosis not present

## 2023-12-27 DIAGNOSIS — N2581 Secondary hyperparathyroidism of renal origin: Secondary | ICD-10-CM | POA: Diagnosis not present

## 2023-12-27 DIAGNOSIS — Z992 Dependence on renal dialysis: Secondary | ICD-10-CM | POA: Diagnosis not present

## 2023-12-28 DIAGNOSIS — Z992 Dependence on renal dialysis: Secondary | ICD-10-CM | POA: Diagnosis not present

## 2023-12-28 DIAGNOSIS — N2581 Secondary hyperparathyroidism of renal origin: Secondary | ICD-10-CM | POA: Diagnosis not present

## 2023-12-28 DIAGNOSIS — N186 End stage renal disease: Secondary | ICD-10-CM | POA: Diagnosis not present

## 2023-12-30 DIAGNOSIS — N186 End stage renal disease: Secondary | ICD-10-CM | POA: Diagnosis not present

## 2023-12-30 DIAGNOSIS — Z992 Dependence on renal dialysis: Secondary | ICD-10-CM | POA: Diagnosis not present

## 2023-12-30 DIAGNOSIS — N2581 Secondary hyperparathyroidism of renal origin: Secondary | ICD-10-CM | POA: Diagnosis not present

## 2024-01-02 DIAGNOSIS — Z992 Dependence on renal dialysis: Secondary | ICD-10-CM | POA: Diagnosis not present

## 2024-01-02 DIAGNOSIS — N186 End stage renal disease: Secondary | ICD-10-CM | POA: Diagnosis not present

## 2024-01-02 DIAGNOSIS — N2581 Secondary hyperparathyroidism of renal origin: Secondary | ICD-10-CM | POA: Diagnosis not present

## 2024-01-04 DIAGNOSIS — N186 End stage renal disease: Secondary | ICD-10-CM | POA: Diagnosis not present

## 2024-01-04 DIAGNOSIS — Z992 Dependence on renal dialysis: Secondary | ICD-10-CM | POA: Diagnosis not present

## 2024-01-04 DIAGNOSIS — N2581 Secondary hyperparathyroidism of renal origin: Secondary | ICD-10-CM | POA: Diagnosis not present

## 2024-01-06 DIAGNOSIS — Z992 Dependence on renal dialysis: Secondary | ICD-10-CM | POA: Diagnosis not present

## 2024-01-06 DIAGNOSIS — N186 End stage renal disease: Secondary | ICD-10-CM | POA: Diagnosis not present

## 2024-01-06 DIAGNOSIS — N2581 Secondary hyperparathyroidism of renal origin: Secondary | ICD-10-CM | POA: Diagnosis not present

## 2024-01-09 DIAGNOSIS — N186 End stage renal disease: Secondary | ICD-10-CM | POA: Diagnosis not present

## 2024-01-09 DIAGNOSIS — N2581 Secondary hyperparathyroidism of renal origin: Secondary | ICD-10-CM | POA: Diagnosis not present

## 2024-01-09 DIAGNOSIS — Z992 Dependence on renal dialysis: Secondary | ICD-10-CM | POA: Diagnosis not present

## 2024-01-11 DIAGNOSIS — N186 End stage renal disease: Secondary | ICD-10-CM | POA: Diagnosis not present

## 2024-01-11 DIAGNOSIS — N2581 Secondary hyperparathyroidism of renal origin: Secondary | ICD-10-CM | POA: Diagnosis not present

## 2024-01-11 DIAGNOSIS — Z992 Dependence on renal dialysis: Secondary | ICD-10-CM | POA: Diagnosis not present

## 2024-01-13 DIAGNOSIS — Z992 Dependence on renal dialysis: Secondary | ICD-10-CM | POA: Diagnosis not present

## 2024-01-13 DIAGNOSIS — N2581 Secondary hyperparathyroidism of renal origin: Secondary | ICD-10-CM | POA: Diagnosis not present

## 2024-01-13 DIAGNOSIS — N186 End stage renal disease: Secondary | ICD-10-CM | POA: Diagnosis not present

## 2024-01-16 DIAGNOSIS — N2581 Secondary hyperparathyroidism of renal origin: Secondary | ICD-10-CM | POA: Diagnosis not present

## 2024-01-16 DIAGNOSIS — N186 End stage renal disease: Secondary | ICD-10-CM | POA: Diagnosis not present

## 2024-01-16 DIAGNOSIS — Z992 Dependence on renal dialysis: Secondary | ICD-10-CM | POA: Diagnosis not present

## 2024-01-18 DIAGNOSIS — N186 End stage renal disease: Secondary | ICD-10-CM | POA: Diagnosis not present

## 2024-01-18 DIAGNOSIS — Z992 Dependence on renal dialysis: Secondary | ICD-10-CM | POA: Diagnosis not present

## 2024-01-18 DIAGNOSIS — N2581 Secondary hyperparathyroidism of renal origin: Secondary | ICD-10-CM | POA: Diagnosis not present

## 2024-01-20 DIAGNOSIS — N2581 Secondary hyperparathyroidism of renal origin: Secondary | ICD-10-CM | POA: Diagnosis not present

## 2024-01-20 DIAGNOSIS — N186 End stage renal disease: Secondary | ICD-10-CM | POA: Diagnosis not present

## 2024-01-20 DIAGNOSIS — Z992 Dependence on renal dialysis: Secondary | ICD-10-CM | POA: Diagnosis not present

## 2024-01-23 ENCOUNTER — Telehealth (INDEPENDENT_AMBULATORY_CARE_PROVIDER_SITE_OTHER): Payer: Self-pay | Admitting: Vascular Surgery

## 2024-01-23 DIAGNOSIS — Z992 Dependence on renal dialysis: Secondary | ICD-10-CM | POA: Diagnosis not present

## 2024-01-23 DIAGNOSIS — N2581 Secondary hyperparathyroidism of renal origin: Secondary | ICD-10-CM | POA: Diagnosis not present

## 2024-01-23 DIAGNOSIS — N186 End stage renal disease: Secondary | ICD-10-CM | POA: Diagnosis not present

## 2024-01-23 NOTE — Telephone Encounter (Signed)
 Azucena Leos, Nurse at [facility] [phone]). States pts access is clotted again is negative Bruit and Thill. Please advise

## 2024-01-23 NOTE — Telephone Encounter (Signed)
 Deborah with [facility] was made aware to fax an order.

## 2024-01-24 ENCOUNTER — Other Ambulatory Visit (INDEPENDENT_AMBULATORY_CARE_PROVIDER_SITE_OTHER): Payer: Self-pay | Admitting: Vascular Surgery

## 2024-01-24 DIAGNOSIS — N186 End stage renal disease: Secondary | ICD-10-CM

## 2024-01-25 DIAGNOSIS — N186 End stage renal disease: Secondary | ICD-10-CM | POA: Diagnosis not present

## 2024-01-25 DIAGNOSIS — N2581 Secondary hyperparathyroidism of renal origin: Secondary | ICD-10-CM | POA: Diagnosis not present

## 2024-01-25 DIAGNOSIS — Z992 Dependence on renal dialysis: Secondary | ICD-10-CM | POA: Diagnosis not present

## 2024-01-27 DIAGNOSIS — Z992 Dependence on renal dialysis: Secondary | ICD-10-CM | POA: Diagnosis not present

## 2024-01-27 DIAGNOSIS — N2581 Secondary hyperparathyroidism of renal origin: Secondary | ICD-10-CM | POA: Diagnosis not present

## 2024-01-27 DIAGNOSIS — N186 End stage renal disease: Secondary | ICD-10-CM | POA: Diagnosis not present

## 2024-01-27 NOTE — Progress Notes (Deleted)
 MRN : [id number]  Joseph Gilmore is a 53 y.o. (1971/08/15) male who presents with chief complaint of check access.  History of Present Illness:    The patient is seen for evaluation of dialysis access.  The patient has a history of multiple failed accesses.  There have been accesses in both arms which are nonfunctioning.    Current access is via a catheter which is functioning poorly the flow rates have been less than ideal.  There have not been multiple episodes of catheter infection.  The patient denies fever and chills while on dialysis.  No tenderness or drainage at the exit site.  No recent shortening of the patient's walking distance or new symptoms consistent with claudication.  No history of rest pain symptoms. No new ulcers or wounds of the lower extremities have occurred.  The patient denies amaurosis fugax or recent TIA symptoms. There are no recent neurological changes noted. There is no history of DVT, PE or superficial thrombophlebitis. No recent episodes of angina or shortness of breath documented.   No outpatient medications have been marked as taking for the 01/29/24 encounter (Appointment) with Prescilla Brod, Ninette Basque, MD.    Past Medical History:  Diagnosis Date   Autism    Chronic kidney disease    ESRD (end stage renal disease) (HCC)    Hypertension    Murmur    OCD (obsessive compulsive disorder)     Past Surgical History:  Procedure Laterality Date   A/V FISTULAGRAM Left 10/07/2020   Procedure: A/V FISTULAGRAM;  Surgeon: Jackquelyn Mass, MD;  Location: [facility];  Service: Cardiovascular;  Laterality: Left;   A/V FISTULAGRAM Left 02/15/2023   Procedure: A/V Fistulagram;  Surgeon: Jackquelyn Mass, MD;  Location: [facility];  Service: Cardiovascular;  Laterality: Left;   A/V FISTULAGRAM Left 11/28/2023   Procedure: A/V Fistulagram;  Surgeon: Jackquelyn Mass, MD;  Location: [facility];  Service:  Cardiovascular;  Laterality: Left;   A/V FISTULAGRAM Left 12/26/2023   Procedure: A/V Fistulagram;  Surgeon: Jackquelyn Mass, MD;  Location: [facility];  Service: Cardiovascular;  Laterality: Left;   AV FISTULA PLACEMENT Left 07/15/2020   Procedure: ARTERIOVENOUS FISTULA CREATION;  Surgeon: Jackquelyn Mass, MD;  Location: [facility];  Service: Vascular;  Laterality: Left;   AV FISTULA PLACEMENT Left 10/04/2023   Procedure: INSERTION OF ARTERIOVENOUS (AV) GORE-TEX GRAFT ARM (BRACHIAL AXILLARY);  Surgeon: Jackquelyn Mass, MD;  Location: [facility];  Service: Vascular;  Laterality: Left;   DIALYSIS/PERMA CATHETER INSERTION N/A 04/06/2020   Procedure: DIALYSIS/PERMA CATHETER INSERTION;  Surgeon: Celso College, MD;  Location: [facility];  Service: Cardiovascular;  Laterality: N/A;   DIALYSIS/PERMA CATHETER INSERTION N/A 05/26/2020   Procedure: DIALYSIS/PERMA CATHETER INSERTION;  Surgeon: Jackquelyn Mass, MD;  Location: [facility];  Service: Cardiovascular;  Laterality: N/A;   DIALYSIS/PERMA CATHETER INSERTION N/A 07/09/2020   Procedure: DIALYSIS/PERMA CATHETER INSERTION;  Surgeon: Celso College, MD;  Location: [facility];  Service: Cardiovascular;  Laterality: N/A;   DIALYSIS/PERMA CATHETER INSERTION N/A 01/08/2021   Procedure: DIALYSIS/PERMA CATHETER INSERTION;  Surgeon: Celso College, MD;  Location: [facility];  Service: Cardiovascular;  Laterality: N/A;   DIALYSIS/PERMA CATHETER INSERTION N/A 03/17/2023   Procedure: DIALYSIS/PERMA CATHETER INSERTION;  Surgeon: Jackquelyn Mass, MD;  Location: [facility]  INVASIVE CV LAB;  Service: Cardiovascular;  Laterality: N/A;   DIALYSIS/PERMA CATHETER REMOVAL N/A 03/15/2021   Procedure: DIALYSIS/PERMA CATHETER REMOVAL;  Surgeon: Celso College, MD;  Location: [facility];  Service: Cardiovascular;  Laterality: N/A;   REVISON OF ARTERIOVENOUS FISTULA Left 12/11/2020   Procedure: ARTERIOVENOUS (AV) FISTULA CREATION  ( BRACHIAL CEPHALIC );  Surgeon: Jackquelyn Mass, MD;  Location: [facility];  Service: Vascular;  Laterality: Left;   TEMPORARY DIALYSIS CATHETER N/A 03/15/2023   Procedure: TEMPORARY DIALYSIS CATHETER;  Surgeon: Jackquelyn Mass, MD;  Location: [facility];  Service: Cardiovascular;  Laterality: N/A;    Social History Social History   Tobacco Use   Smoking status: Never    Passive exposure: Never   Smokeless tobacco: Never  Vaping Use   Vaping status: Never Used  Substance Use Topics   Alcohol use: Never   Drug use: Never    Family History No family history on file.  Allergies  Allergen Reactions   Chlorhexidine  Other (See Comments)    Blisters (topical)     REVIEW OF SYSTEMS (Negative unless checked)  Constitutional: [] Weight loss  [] Fever  [] Chills Cardiac: [] Chest pain   [] Chest pressure   [] Palpitations   [] Shortness of breath when laying flat   [] Shortness of breath with exertion. Vascular:  [] Pain in legs with walking   [] Pain in legs at rest  [] History of DVT   [] Phlebitis   [] Swelling in legs   [] Varicose veins   [] Non-healing ulcers Pulmonary:   [] Uses home oxygen   [] Productive cough   [] Hemoptysis   [] Wheeze  [] COPD   [] Asthma Neurologic:  [] Dizziness   [] Seizures   [] History of stroke   [] History of TIA  [] Aphasia   [] Vissual changes   [] Weakness or numbness in arm   [] Weakness or numbness in leg Musculoskeletal:   [] Joint swelling   [] Joint pain   [] Low back pain Hematologic:  [] Easy bruising  [] Easy bleeding   [] Hypercoagulable state   [] Anemic Gastrointestinal:  [] Diarrhea   [] Vomiting  [] Gastroesophageal reflux/heartburn   [] Difficulty swallowing. Genitourinary:  [x] Chronic kidney disease   [] Difficult urination  [] Frequent urination   [] Blood in urine Skin:  [] Rashes   [] Ulcers  Psychological:  [] History of anxiety   []  History of major depression.  Physical Examination  There were no vitals filed for this visit. There is no height or weight  on file to calculate BMI. Gen: WD/WN, NAD Head: [place]/AT, No temporalis wasting.  Ear/Nose/Throat: Hearing grossly intact, nares w/o erythema or drainage Eyes: PER, EOMI, sclera nonicteric.  Neck: Supple, no gross masses or lesions.  No JVD.  Pulmonary:  Good air movement, no audible wheezing, no use of accessory muscles.  Cardiac: RRR, precordium non-hyperdynamic. Vascular:   *** Vessel Right Left  Radial Palpable Palpable  Brachial Palpable Palpable  Gastrointestinal: soft, non-distended. No guarding/no peritoneal signs.  Musculoskeletal: M/S 5/5 throughout.  No deformity.  Neurologic: CN 2-12 intact. Pain and light touch intact in extremities.  Symmetrical.  Speech is fluent. Motor exam as listed above. Psychiatric: Judgment intact, Mood & affect appropriate for pt's clinical situation. Dermatologic: No rashes or ulcers noted.  No changes consistent with cellulitis.   CBC Lab Results  Component Value Date   WBC 12.7 (H) 09/29/2023   HGB 9.2 (L) 10/04/2023   HCT 27.0 (L) 10/04/2023   MCV 96.5 09/29/2023   PLT 180 09/29/2023    BMET    Component Value Date/Time   NA 139 10/04/2023  1433   K 4.5 10/04/2023 1433   CL 101 10/04/2023 1433   CO2 22 09/29/2023 1422   GLUCOSE 88 10/04/2023 1433   BUN 41 (H) 10/04/2023 1433   CREATININE >18.00 (H) 10/04/2023 1433   CALCIUM  8.8 (L) 09/29/2023 1422   GFRNONAA 5 (L) 09/29/2023 1422   GFRAA 8 (L) 04/24/2020 1219   CrCl cannot be calculated (Patient's most recent lab result is older than the maximum 21 days allowed.).  COAG Lab Results  Component Value Date   INR 1.1 12/09/2020   INR 1.1 07/13/2020   INR 1.2 04/06/2020    Radiology No results found.   Assessment/Plan There are no diagnoses linked to this encounter.   Devon Fogo, MD  01/27/2024 4:02 PM

## 2024-01-29 ENCOUNTER — Ambulatory Visit (INDEPENDENT_AMBULATORY_CARE_PROVIDER_SITE_OTHER): Admitting: Vascular Surgery

## 2024-01-29 ENCOUNTER — Encounter (INDEPENDENT_AMBULATORY_CARE_PROVIDER_SITE_OTHER)

## 2024-01-29 DIAGNOSIS — N186 End stage renal disease: Secondary | ICD-10-CM

## 2024-01-29 DIAGNOSIS — I10 Essential (primary) hypertension: Secondary | ICD-10-CM

## 2024-01-29 DIAGNOSIS — D689 Coagulation defect, unspecified: Secondary | ICD-10-CM

## 2024-01-29 DIAGNOSIS — T829XXS Unspecified complication of cardiac and vascular prosthetic device, implant and graft, sequela: Secondary | ICD-10-CM

## 2024-01-30 DIAGNOSIS — Z992 Dependence on renal dialysis: Secondary | ICD-10-CM | POA: Diagnosis not present

## 2024-01-30 DIAGNOSIS — N186 End stage renal disease: Secondary | ICD-10-CM | POA: Diagnosis not present

## 2024-01-30 DIAGNOSIS — N2581 Secondary hyperparathyroidism of renal origin: Secondary | ICD-10-CM | POA: Diagnosis not present

## 2024-02-01 DIAGNOSIS — Z992 Dependence on renal dialysis: Secondary | ICD-10-CM | POA: Diagnosis not present

## 2024-02-01 DIAGNOSIS — N2581 Secondary hyperparathyroidism of renal origin: Secondary | ICD-10-CM | POA: Diagnosis not present

## 2024-02-01 DIAGNOSIS — N186 End stage renal disease: Secondary | ICD-10-CM | POA: Diagnosis not present

## 2024-02-03 DIAGNOSIS — N2581 Secondary hyperparathyroidism of renal origin: Secondary | ICD-10-CM | POA: Diagnosis not present

## 2024-02-03 DIAGNOSIS — N186 End stage renal disease: Secondary | ICD-10-CM | POA: Diagnosis not present

## 2024-02-03 DIAGNOSIS — Z992 Dependence on renal dialysis: Secondary | ICD-10-CM | POA: Diagnosis not present

## 2024-02-06 DIAGNOSIS — N186 End stage renal disease: Secondary | ICD-10-CM | POA: Diagnosis not present

## 2024-02-06 DIAGNOSIS — Z992 Dependence on renal dialysis: Secondary | ICD-10-CM | POA: Diagnosis not present

## 2024-02-06 DIAGNOSIS — N2581 Secondary hyperparathyroidism of renal origin: Secondary | ICD-10-CM | POA: Diagnosis not present

## 2024-02-08 DIAGNOSIS — N2581 Secondary hyperparathyroidism of renal origin: Secondary | ICD-10-CM | POA: Diagnosis not present

## 2024-02-08 DIAGNOSIS — Z992 Dependence on renal dialysis: Secondary | ICD-10-CM | POA: Diagnosis not present

## 2024-02-08 DIAGNOSIS — N186 End stage renal disease: Secondary | ICD-10-CM | POA: Diagnosis not present

## 2024-02-10 DIAGNOSIS — N2581 Secondary hyperparathyroidism of renal origin: Secondary | ICD-10-CM | POA: Diagnosis not present

## 2024-02-10 DIAGNOSIS — Z992 Dependence on renal dialysis: Secondary | ICD-10-CM | POA: Diagnosis not present

## 2024-02-10 DIAGNOSIS — N186 End stage renal disease: Secondary | ICD-10-CM | POA: Diagnosis not present

## 2024-02-13 DIAGNOSIS — Z992 Dependence on renal dialysis: Secondary | ICD-10-CM | POA: Diagnosis not present

## 2024-02-13 DIAGNOSIS — N2581 Secondary hyperparathyroidism of renal origin: Secondary | ICD-10-CM | POA: Diagnosis not present

## 2024-02-13 DIAGNOSIS — N186 End stage renal disease: Secondary | ICD-10-CM | POA: Diagnosis not present

## 2024-02-15 DIAGNOSIS — Z992 Dependence on renal dialysis: Secondary | ICD-10-CM | POA: Diagnosis not present

## 2024-02-15 DIAGNOSIS — N2581 Secondary hyperparathyroidism of renal origin: Secondary | ICD-10-CM | POA: Diagnosis not present

## 2024-02-15 DIAGNOSIS — N186 End stage renal disease: Secondary | ICD-10-CM | POA: Diagnosis not present

## 2024-02-16 ENCOUNTER — Ambulatory Visit: Payer: Medicare HMO | Admitting: Internal Medicine

## 2024-02-16 ENCOUNTER — Encounter: Payer: Self-pay | Admitting: Internal Medicine

## 2024-02-16 VITALS — BP 120/80 | HR 88 | Temp 97.7°F | Ht 68.0 in | Wt 278.6 lb

## 2024-02-16 DIAGNOSIS — T63461A Toxic effect of venom of wasps, accidental (unintentional), initial encounter: Secondary | ICD-10-CM

## 2024-02-16 DIAGNOSIS — H05221 Edema of right orbit: Secondary | ICD-10-CM

## 2024-02-16 DIAGNOSIS — E66811 Obesity, class 1: Secondary | ICD-10-CM

## 2024-02-16 DIAGNOSIS — N400 Enlarged prostate without lower urinary tract symptoms: Secondary | ICD-10-CM | POA: Diagnosis not present

## 2024-02-16 DIAGNOSIS — I10 Essential (primary) hypertension: Secondary | ICD-10-CM | POA: Diagnosis not present

## 2024-02-16 DIAGNOSIS — Z6832 Body mass index (BMI) 32.0-32.9, adult: Secondary | ICD-10-CM | POA: Diagnosis not present

## 2024-02-16 DIAGNOSIS — E6609 Other obesity due to excess calories: Secondary | ICD-10-CM

## 2024-02-16 DIAGNOSIS — T63441A Toxic effect of venom of bees, accidental (unintentional), initial encounter: Secondary | ICD-10-CM

## 2024-02-16 DIAGNOSIS — T63451A Toxic effect of venom of hornets, accidental (unintentional), initial encounter: Secondary | ICD-10-CM

## 2024-02-16 MED ORDER — METHYLPREDNISOLONE ACETATE 40 MG/ML IJ SUSP
40.0000 mg | Freq: Once | INTRAMUSCULAR | Status: AC
  Administered 2024-02-16: 80 mg via INTRAMUSCULAR

## 2024-02-16 MED ORDER — METHYLPREDNISOLONE ACETATE 80 MG/ML IJ SUSP: 80.0000 mg | Freq: Once | INTRAMUSCULAR | Status: DC

## 2024-02-16 NOTE — Progress Notes (Signed)
 Established Patient Office Visit  Subjective:  Patient ID: Joseph Gilmore, male    DOB: Feb 08, 1971  Age: 53 y.o. MRN: [id number]  Chief Complaint  Patient presents with   Follow-up    4 month follow up and BP    C/o right eye painful swelling after he was stung by a wasp 2 days ago. Slight improvement with topical application of benadryl  but still unable to fully open his eye.     No other concerns at this time.   Past Medical History:  Diagnosis Date   Autism    Chronic kidney disease    ESRD (end stage renal disease) (HCC)    Hypertension    Murmur    OCD (obsessive compulsive disorder)     Past Surgical History:  Procedure Laterality Date   A/V FISTULAGRAM Left 10/07/2020   Procedure: A/V FISTULAGRAM;  Surgeon: Jackquelyn Mass, MD;  Location: [facility];  Service: Cardiovascular;  Laterality: Left;   A/V FISTULAGRAM Left 02/15/2023   Procedure: A/V Fistulagram;  Surgeon: Jackquelyn Mass, MD;  Location: [facility];  Service: Cardiovascular;  Laterality: Left;   A/V FISTULAGRAM Left 11/28/2023   Procedure: A/V Fistulagram;  Surgeon: Jackquelyn Mass, MD;  Location: [facility];  Service: Cardiovascular;  Laterality: Left;   A/V FISTULAGRAM Left 12/26/2023   Procedure: A/V Fistulagram;  Surgeon: Jackquelyn Mass, MD;  Location: [facility];  Service: Cardiovascular;  Laterality: Left;   AV FISTULA PLACEMENT Left 07/15/2020   Procedure: ARTERIOVENOUS FISTULA CREATION;  Surgeon: Jackquelyn Mass, MD;  Location: [facility];  Service: Vascular;  Laterality: Left;   AV FISTULA PLACEMENT Left 10/04/2023   Procedure: INSERTION OF ARTERIOVENOUS (AV) GORE-TEX GRAFT ARM (BRACHIAL AXILLARY);  Surgeon: Jackquelyn Mass, MD;  Location: [facility];  Service: Vascular;  Laterality: Left;   DIALYSIS/PERMA CATHETER INSERTION N/A 04/06/2020   Procedure: DIALYSIS/PERMA CATHETER INSERTION;  Surgeon: Celso College, MD;  Location: [facility];   Service: Cardiovascular;  Laterality: N/A;   DIALYSIS/PERMA CATHETER INSERTION N/A 05/26/2020   Procedure: DIALYSIS/PERMA CATHETER INSERTION;  Surgeon: Jackquelyn Mass, MD;  Location: [facility];  Service: Cardiovascular;  Laterality: N/A;   DIALYSIS/PERMA CATHETER INSERTION N/A 07/09/2020   Procedure: DIALYSIS/PERMA CATHETER INSERTION;  Surgeon: Celso College, MD;  Location: [facility];  Service: Cardiovascular;  Laterality: N/A;   DIALYSIS/PERMA CATHETER INSERTION N/A 01/08/2021   Procedure: DIALYSIS/PERMA CATHETER INSERTION;  Surgeon: Celso College, MD;  Location: [facility];  Service: Cardiovascular;  Laterality: N/A;   DIALYSIS/PERMA CATHETER INSERTION N/A 03/17/2023   Procedure: DIALYSIS/PERMA CATHETER INSERTION;  Surgeon: Jackquelyn Mass, MD;  Location: [facility];  Service: Cardiovascular;  Laterality: N/A;   DIALYSIS/PERMA CATHETER REMOVAL N/A 03/15/2021   Procedure: DIALYSIS/PERMA CATHETER REMOVAL;  Surgeon: Celso College, MD;  Location: [facility];  Service: Cardiovascular;  Laterality: N/A;   REVISON OF ARTERIOVENOUS FISTULA Left 12/11/2020   Procedure: ARTERIOVENOUS (AV) FISTULA CREATION ( BRACHIAL CEPHALIC );  Surgeon: Jackquelyn Mass, MD;  Location: [facility];  Service: Vascular;  Laterality: Left;   TEMPORARY DIALYSIS CATHETER N/A 03/15/2023   Procedure: TEMPORARY DIALYSIS CATHETER;  Surgeon: Jackquelyn Mass, MD;  Location: [facility];  Service: Cardiovascular;  Laterality: N/A;    Social History   Socioeconomic History   Marital status: Single    Spouse name: Not on file   Number of children:  Not on file   Years of education: Not on file   Highest education level: Not on file  Occupational History   Not on file  Tobacco Use   Smoking status: Never    Passive exposure: Never   Smokeless tobacco: Never  Vaping Use   Vaping status: Never Used  Substance and Sexual Activity   Alcohol use: Never   Drug use:  Never   Sexual activity: Not Currently  Other Topics Concern   Not on file  Social History Narrative   Lives with father .   Social Drivers of Corporate investment banker Strain: Not on file  Food Insecurity: No Food Insecurity (05/10/2022)   Hunger Vital Sign    Worried About Running Out of Food in the Last Year: Never true    Ran Out of Food in the Last Year: Never true  Transportation Needs: No Transportation Needs (05/10/2022)   PRAPARE - Administrator, Civil Service (Medical): No    Lack of Transportation (Non-Medical): No  Physical Activity: Not on file  Stress: Not on file  Social Connections: Not on file  Intimate Partner Violence: Not on file    No family history on file.  Allergies  Allergen Reactions   Chlorhexidine  Other (See Comments)    Blisters (topical)    Outpatient Medications Prior to Visit  Medication Sig   calcium  acetate (PHOSLO ) 667 MG capsule Take 667 mg by mouth 3 (three) times daily with meals.   Cholecalciferol (VITAMIN D -3) 125 MCG (5000 UT) TABS Take 5,000 Units by mouth daily.   dutasteride  (AVODART ) 0.5 MG capsule TAKE ONE CAPSULE BY MOUTH DAILY   HYDROcodone -acetaminophen  (NORCO) 5-325 MG tablet Take 1-2 tablets by mouth every 6 (six) hours as needed for moderate pain (pain score 4-6) or severe pain (pain score 7-10).   Methoxy PEG-Epoetin  Beta (MIRCERA IJ) Mircera   midodrine (PROAMATINE) 2.5 MG tablet Take 2.5 mg by mouth See admin instructions. Take 2.5 mg by mouth on Tuesday, Thursday and Saturday at dialysis as needed for low blood pressure.   multivitamin (RENA-VIT) TABS tablet Take by mouth.   tamsulosin  (FLOMAX ) 0.4 MG CAPS capsule TAKE ONE CAPSULE BY MOUTH DAILY   No facility-administered medications prior to visit.    Review of Systems  Constitutional: Negative.   HENT: Negative.    Eyes: Negative.   Respiratory: Negative.    Cardiovascular: Negative.   Gastrointestinal: Negative.   Genitourinary: Negative.    Skin: Negative.        As in hpi  Neurological: Negative.   Endo/Heme/Allergies: Negative.        Objective:   BP 120/80   Pulse 88   Temp 97.7 F (36.5 C)   Ht 5' 8 (1.727 m)   Wt 278 lb 9.6 oz (126.4 kg)   SpO2 97%   BMI 42.36 kg/m   Vitals:   02/16/24 1350  BP: 120/80  Pulse: 88  Temp: 97.7 F (36.5 C)  Height: 5' 8 (1.727 m)  Weight: 278 lb 9.6 oz (126.4 kg)  SpO2: 97%  BMI (Calculated): 42.37    Physical Exam Vitals reviewed.  Constitutional:      Appearance: Normal appearance. He is obese.  HENT:     Head: Normocephalic.     Left Ear: There is no impacted cerumen.     Nose: Nose normal.     Mouth/Throat:     Mouth: Mucous membranes are moist.     Pharynx: No posterior oropharyngeal  erythema.   Eyes:     Extraocular Movements: Extraocular movements intact.     Pupils: Pupils are equal, round, and reactive to light.    Cardiovascular:     Rate and Rhythm: Regular rhythm.     Chest Wall: PMI is not displaced.     Pulses: Normal pulses.     Heart sounds: Normal heart sounds. No murmur heard. Pulmonary:     Effort: Pulmonary effort is normal.     Breath sounds: Normal air entry. Rhonchi present. No rales.  Chest:     Comments: Left HD catheter site fine. Abdominal:     General: Abdomen is flat. Bowel sounds are normal. There is no distension.     Palpations: Abdomen is soft. There is no hepatomegaly, splenomegaly or mass.     Tenderness: There is no abdominal tenderness.   Musculoskeletal:        General: Normal range of motion.     Cervical back: Normal range of motion and neck supple.     Right lower leg: No edema.     Left lower leg: No edema.   Skin:    General: Skin is warm and dry.   Neurological:     General: No focal deficit present.     Mental Status: He is alert and oriented to person, place, and time.     Cranial Nerves: No cranial nerve deficit.     Motor: No weakness.   Psychiatric:        Mood and Affect: Mood normal.         Behavior: Behavior normal.      No results found for any visits on 02/16/24.  Recent Results (from the past 2160 hours)  Potassium [facility] vascular lab only)     Status: None   Collection Time: 11/28/23  2:25 PM  Result Value Ref Range   Potassium [facility] vascular lab) 4.7 3.5 - 5.1 mmol/L    Comment: Performed at [facility], [address], [place], [place] [address]  Potassium [facility] vascular lab only)     Status: None   Collection Time: 12/26/23  1:30 PM  Result Value Ref Range   Potassium [facility] vascular lab) 4.1 3.5 - 5.1 mmol/L    Comment: Performed at [facility], [address] [place], [place] [address]      Assessment & Plan:   Problem List Items Addressed This Visit       Cardiovascular and Mediastinum   Essential hypertension     Genitourinary   Benign prostatic hyperplasia without lower urinary tract symptoms   Relevant Orders   PSA   Other Visit Diagnoses       Toxic reaction to hornets, wasps and bees, accidental or unintentional, initial encounter    -  Primary     Edema of right orbit         Class 1 obesity due to excess calories with serious comorbidity and body mass index (BMI) of 32.0 to 32.9 in adult       Relevant Orders   Lipid panel       Return in about 2 months (around 04/17/2024) for awv with labs prior, BP followup.   Total time spent: 20 minutes  Arzella Bitters, MD  02/16/2024   This document may have been prepared by [facility] Voice Recognition software and as such may include unintentional dictation errors.

## 2024-02-17 DIAGNOSIS — Z992 Dependence on renal dialysis: Secondary | ICD-10-CM | POA: Diagnosis not present

## 2024-02-17 DIAGNOSIS — N2581 Secondary hyperparathyroidism of renal origin: Secondary | ICD-10-CM | POA: Diagnosis not present

## 2024-02-17 DIAGNOSIS — N186 End stage renal disease: Secondary | ICD-10-CM | POA: Diagnosis not present

## 2024-02-20 DIAGNOSIS — Z992 Dependence on renal dialysis: Secondary | ICD-10-CM | POA: Diagnosis not present

## 2024-02-20 DIAGNOSIS — N186 End stage renal disease: Secondary | ICD-10-CM | POA: Diagnosis not present

## 2024-02-20 DIAGNOSIS — N2581 Secondary hyperparathyroidism of renal origin: Secondary | ICD-10-CM | POA: Diagnosis not present

## 2024-02-22 DIAGNOSIS — N186 End stage renal disease: Secondary | ICD-10-CM | POA: Diagnosis not present

## 2024-02-22 DIAGNOSIS — N2581 Secondary hyperparathyroidism of renal origin: Secondary | ICD-10-CM | POA: Diagnosis not present

## 2024-02-22 DIAGNOSIS — Z992 Dependence on renal dialysis: Secondary | ICD-10-CM | POA: Diagnosis not present

## 2024-02-24 DIAGNOSIS — N186 End stage renal disease: Secondary | ICD-10-CM | POA: Diagnosis not present

## 2024-02-24 DIAGNOSIS — Z992 Dependence on renal dialysis: Secondary | ICD-10-CM | POA: Diagnosis not present

## 2024-02-24 DIAGNOSIS — N2581 Secondary hyperparathyroidism of renal origin: Secondary | ICD-10-CM | POA: Diagnosis not present

## 2024-02-26 DIAGNOSIS — Z992 Dependence on renal dialysis: Secondary | ICD-10-CM | POA: Diagnosis not present

## 2024-02-26 DIAGNOSIS — N186 End stage renal disease: Secondary | ICD-10-CM | POA: Diagnosis not present

## 2024-02-27 DIAGNOSIS — N2581 Secondary hyperparathyroidism of renal origin: Secondary | ICD-10-CM | POA: Diagnosis not present

## 2024-02-27 DIAGNOSIS — Z992 Dependence on renal dialysis: Secondary | ICD-10-CM | POA: Diagnosis not present

## 2024-02-27 DIAGNOSIS — N186 End stage renal disease: Secondary | ICD-10-CM | POA: Diagnosis not present

## 2024-02-29 DIAGNOSIS — Z992 Dependence on renal dialysis: Secondary | ICD-10-CM | POA: Diagnosis not present

## 2024-02-29 DIAGNOSIS — N186 End stage renal disease: Secondary | ICD-10-CM | POA: Diagnosis not present

## 2024-02-29 DIAGNOSIS — N2581 Secondary hyperparathyroidism of renal origin: Secondary | ICD-10-CM | POA: Diagnosis not present

## 2024-03-02 DIAGNOSIS — Z992 Dependence on renal dialysis: Secondary | ICD-10-CM | POA: Diagnosis not present

## 2024-03-02 DIAGNOSIS — N186 End stage renal disease: Secondary | ICD-10-CM | POA: Diagnosis not present

## 2024-03-02 DIAGNOSIS — N2581 Secondary hyperparathyroidism of renal origin: Secondary | ICD-10-CM | POA: Diagnosis not present

## 2024-03-04 ENCOUNTER — Encounter (INDEPENDENT_AMBULATORY_CARE_PROVIDER_SITE_OTHER)

## 2024-03-04 ENCOUNTER — Ambulatory Visit (INDEPENDENT_AMBULATORY_CARE_PROVIDER_SITE_OTHER): Admitting: Nurse Practitioner

## 2024-03-05 DIAGNOSIS — Z992 Dependence on renal dialysis: Secondary | ICD-10-CM | POA: Diagnosis not present

## 2024-03-05 DIAGNOSIS — N2581 Secondary hyperparathyroidism of renal origin: Secondary | ICD-10-CM | POA: Diagnosis not present

## 2024-03-05 DIAGNOSIS — N186 End stage renal disease: Secondary | ICD-10-CM | POA: Diagnosis not present

## 2024-03-07 DIAGNOSIS — Z992 Dependence on renal dialysis: Secondary | ICD-10-CM | POA: Diagnosis not present

## 2024-03-07 DIAGNOSIS — N186 End stage renal disease: Secondary | ICD-10-CM | POA: Diagnosis not present

## 2024-03-07 DIAGNOSIS — N2581 Secondary hyperparathyroidism of renal origin: Secondary | ICD-10-CM | POA: Diagnosis not present

## 2024-03-09 DIAGNOSIS — Z992 Dependence on renal dialysis: Secondary | ICD-10-CM | POA: Diagnosis not present

## 2024-03-09 DIAGNOSIS — N186 End stage renal disease: Secondary | ICD-10-CM | POA: Diagnosis not present

## 2024-03-09 DIAGNOSIS — N2581 Secondary hyperparathyroidism of renal origin: Secondary | ICD-10-CM | POA: Diagnosis not present

## 2024-03-12 DIAGNOSIS — Z992 Dependence on renal dialysis: Secondary | ICD-10-CM | POA: Diagnosis not present

## 2024-03-12 DIAGNOSIS — N186 End stage renal disease: Secondary | ICD-10-CM | POA: Diagnosis not present

## 2024-03-12 DIAGNOSIS — N2581 Secondary hyperparathyroidism of renal origin: Secondary | ICD-10-CM | POA: Diagnosis not present

## 2024-03-14 DIAGNOSIS — Z992 Dependence on renal dialysis: Secondary | ICD-10-CM | POA: Diagnosis not present

## 2024-03-14 DIAGNOSIS — N2581 Secondary hyperparathyroidism of renal origin: Secondary | ICD-10-CM | POA: Diagnosis not present

## 2024-03-14 DIAGNOSIS — N186 End stage renal disease: Secondary | ICD-10-CM | POA: Diagnosis not present

## 2024-03-16 DIAGNOSIS — Z992 Dependence on renal dialysis: Secondary | ICD-10-CM | POA: Diagnosis not present

## 2024-03-16 DIAGNOSIS — N186 End stage renal disease: Secondary | ICD-10-CM | POA: Diagnosis not present

## 2024-03-16 DIAGNOSIS — N2581 Secondary hyperparathyroidism of renal origin: Secondary | ICD-10-CM | POA: Diagnosis not present

## 2024-03-19 DIAGNOSIS — N2581 Secondary hyperparathyroidism of renal origin: Secondary | ICD-10-CM | POA: Diagnosis not present

## 2024-03-19 DIAGNOSIS — Z992 Dependence on renal dialysis: Secondary | ICD-10-CM | POA: Diagnosis not present

## 2024-03-19 DIAGNOSIS — N186 End stage renal disease: Secondary | ICD-10-CM | POA: Diagnosis not present

## 2024-03-21 DIAGNOSIS — Z992 Dependence on renal dialysis: Secondary | ICD-10-CM | POA: Diagnosis not present

## 2024-03-21 DIAGNOSIS — N2581 Secondary hyperparathyroidism of renal origin: Secondary | ICD-10-CM | POA: Diagnosis not present

## 2024-03-21 DIAGNOSIS — N186 End stage renal disease: Secondary | ICD-10-CM | POA: Diagnosis not present

## 2024-03-23 DIAGNOSIS — Z992 Dependence on renal dialysis: Secondary | ICD-10-CM | POA: Diagnosis not present

## 2024-03-23 DIAGNOSIS — N2581 Secondary hyperparathyroidism of renal origin: Secondary | ICD-10-CM | POA: Diagnosis not present

## 2024-03-23 DIAGNOSIS — N186 End stage renal disease: Secondary | ICD-10-CM | POA: Diagnosis not present

## 2024-03-26 DIAGNOSIS — N2581 Secondary hyperparathyroidism of renal origin: Secondary | ICD-10-CM | POA: Diagnosis not present

## 2024-03-26 DIAGNOSIS — Z992 Dependence on renal dialysis: Secondary | ICD-10-CM | POA: Diagnosis not present

## 2024-03-26 DIAGNOSIS — N186 End stage renal disease: Secondary | ICD-10-CM | POA: Diagnosis not present

## 2024-03-28 DIAGNOSIS — N2581 Secondary hyperparathyroidism of renal origin: Secondary | ICD-10-CM | POA: Diagnosis not present

## 2024-03-28 DIAGNOSIS — N186 End stage renal disease: Secondary | ICD-10-CM | POA: Diagnosis not present

## 2024-03-28 DIAGNOSIS — Z992 Dependence on renal dialysis: Secondary | ICD-10-CM | POA: Diagnosis not present

## 2024-03-30 DIAGNOSIS — N2581 Secondary hyperparathyroidism of renal origin: Secondary | ICD-10-CM | POA: Diagnosis not present

## 2024-03-30 DIAGNOSIS — N186 End stage renal disease: Secondary | ICD-10-CM | POA: Diagnosis not present

## 2024-03-30 DIAGNOSIS — Z992 Dependence on renal dialysis: Secondary | ICD-10-CM | POA: Diagnosis not present

## 2024-04-02 DIAGNOSIS — N2581 Secondary hyperparathyroidism of renal origin: Secondary | ICD-10-CM | POA: Diagnosis not present

## 2024-04-02 DIAGNOSIS — Z992 Dependence on renal dialysis: Secondary | ICD-10-CM | POA: Diagnosis not present

## 2024-04-02 DIAGNOSIS — N186 End stage renal disease: Secondary | ICD-10-CM | POA: Diagnosis not present

## 2024-04-04 DIAGNOSIS — N2581 Secondary hyperparathyroidism of renal origin: Secondary | ICD-10-CM | POA: Diagnosis not present

## 2024-04-04 DIAGNOSIS — N186 End stage renal disease: Secondary | ICD-10-CM | POA: Diagnosis not present

## 2024-04-04 DIAGNOSIS — Z992 Dependence on renal dialysis: Secondary | ICD-10-CM | POA: Diagnosis not present

## 2024-04-06 DIAGNOSIS — Z992 Dependence on renal dialysis: Secondary | ICD-10-CM | POA: Diagnosis not present

## 2024-04-06 DIAGNOSIS — N2581 Secondary hyperparathyroidism of renal origin: Secondary | ICD-10-CM | POA: Diagnosis not present

## 2024-04-06 DIAGNOSIS — N186 End stage renal disease: Secondary | ICD-10-CM | POA: Diagnosis not present

## 2024-04-09 DIAGNOSIS — N2581 Secondary hyperparathyroidism of renal origin: Secondary | ICD-10-CM | POA: Diagnosis not present

## 2024-04-09 DIAGNOSIS — Z992 Dependence on renal dialysis: Secondary | ICD-10-CM | POA: Diagnosis not present

## 2024-04-09 DIAGNOSIS — N186 End stage renal disease: Secondary | ICD-10-CM | POA: Diagnosis not present

## 2024-04-11 DIAGNOSIS — N186 End stage renal disease: Secondary | ICD-10-CM | POA: Diagnosis not present

## 2024-04-11 DIAGNOSIS — N2581 Secondary hyperparathyroidism of renal origin: Secondary | ICD-10-CM | POA: Diagnosis not present

## 2024-04-11 DIAGNOSIS — Z992 Dependence on renal dialysis: Secondary | ICD-10-CM | POA: Diagnosis not present

## 2024-04-11 NOTE — Progress Notes (Signed)
 Patient: Joseph Gilmore, Feb 21, 1971, 53y, M Dialysis Location: [facility] Attending Nephrologist: Woodward Creighton Kolluru  Service Date: 04/11/2024 Service Provider: Woodward Creighton Brought, MD I met face to face with the patient today.  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   7/24 BP 141/72, 3LUF Patient seen during dialysis, tolerating treatment. No complaints to offer.   7/17 191/103 3L UF Denies cramping. endorses good appetite.  7/8 BP 174/113, 3L UF No acute complaints  7/3 was recently bitten by a wasp. He seeked treatment with his PCP.  HOME MEDICATIONS  Home Medications:    Calcium  Acetate 667 mg, By Mouth, Take 1 Capsule Every 24 hours With Meals (Take 1 capsule one time a day after dinner)   Calcium  Acetate 667 mg, By Mouth, Take 1 Capsule Three times a day With Meals   Lasix (furosemide) 80 mg, by mouth, Take 1 tablet as directed take 1 tablet on non dialysis days ( Monday, Wednesday, Friday, Sunday )   Rena-Vite (b complex-vitamin c-folic acid ) 0.8 mg, by mouth, Take 1 tablet once a day Take after dialysis on dialysis days   ergocalciferol  (vitamin D2) (ergocalciferol  (vitamin d2)) 1,250 mcg (50,000 unit), by mouth, Take 1 capsule once a week   Multi Complete with Iron (multivitamin-iron-folic acid ) 18-400 mg-mcg, by mouth, Take 1 tablet once a day   Avodart  (dutasteride ) 0.5 mg, by mouth, Take 1 capsule once a day   midodrine 5 mg, by mouth, Take 1 tablet once a day as needed for low blood pressure on dialysis  Allergies:    chlorhexidine   LAST HOSPITALIZATION Discharge Diagnosis: E87.5 Hyperkalemia T82.49XA Other complication of vascular dialysis catheter, initial encounter Admission Date 03/15/23 Discharge Date 03/17/23  TRANSPLANT Comments: Not a candidate due to learning disability and lack of  social support.  DIALYSIS PRESCRIPTION   IHD 3x Week Start date: 04/06/24   Dialyzer: 180NRe Optiflux   BFR: 450   DFR: Manual 800   Potassium: 2.0   Sodium: 137   EDW: 122.4   Duration: 4:15   Calcium : 3.0   Bicarb: 35   Rx updated on: 04/06/2024  TREATMENT ASSESSMENT Comments: BP improves with dialysis  Blood pressure elevated. No changes indicated.   BP Stand Pre   04/09/2024: 224/105   04/06/2024: 186/102   04/04/2024: 204/116  BP Sit Pre   04/09/2024: 186/105   04/06/2024: 175/95   04/04/2024: 193/118  BP Stand Post   04/06/2024: 155/114   04/04/2024: 174/110  BP Sit Post   04/09/2024: 186/97   04/06/2024: 198/113   04/04/2024: 184/114  Tx Duration   04/09/2024: 4:18   04/06/2024: 4:20   04/04/2024: 4:17  Missed Treatments 0 - last 30 days 0 - last 60 days  FLUID ASSESSMENT  Fluid status acceptable. Interdialytic weight gain acceptable. No changes indicated.   EDW (kg)   04/09/2024: 122.4   04/06/2024: 122.8   04/04/2024: 123.5  Weight Pre (kg)   04/09/2024: 125.6   04/06/2024: 124.0   04/04/2024: 125.7  Weight Post (kg)   04/09/2024: 122.6   04/06/2024: 122.4   04/04/2024: 122.8  PWV (kg)   04/09/2024: 0.2   04/06/2024: -0.4   04/04/2024: -0.7  UF Rate (mL/kg/hr)   04/09/2024: 5.7   04/06/2024: 3   04/04/2024: 5.5  ADEQUACY ASSESSMENT  Adequacy target met. Prescription compliance acceptable. No changes indicated.  spKt/V, URR   04/02/2024: 1.61, 75.0   03/05/2024: 1.48, 72.0   02/08/2024: 1.44, 71.0  ACCESS ASSESSMENT   Access Type: CVCatheter   Access SubType: Tunneled   Access Status: Active (In Use) - 02/13/2024   Access Location: Chest   Placed: 03/15/2023  Comments: AVG revised placed on 2/5 - clotted. Using CVC.  Vascular appointment coming  Vascular access reviewed. Current access is temporary and referral has been made for fistula/graft placement.  ANEMIA ASSESSMENT  HGB at goal. No changes indicated.    HGB, TSAT   04/09/2024: 10.2, -   04/02/2024: 10.8, 49.0   03/26/2024: 10.7, -    Ferritin   01/30/2024: 1265.0   01/02/2024: 1331.0   12/05/2023: 804.0  Mircera, IVP (mcg)   04/06/2024: 50   03/09/2024: 60   02/24/2024: 60  Iron Sucrose (Venofer) (mg)   01/27/2024: 50   01/20/2024: 50   01/13/2024: 50  BMM ASSESSMENT  PTH controlled. Calcium  controlled. Phosphorus controlled. BMM meds adherence acceptable. No changes indicated.   PTH, Intact   04/02/2024: 200.0   03/05/2024: 238.0   01/30/2024: 116.0    Calcium , Phosphorus   04/02/2024: 8.5, 3.9   03/05/2024: 8.1, 3.6   02/15/2024: -, 3.9  Vitamin D  (Calcitriol) Oral (mcg)   04/09/2024: 0.25   04/06/2024: 0.25   04/04/2024: 0.25  NUTRITION ASSESSMENT  Potassium controlled. Albumin controlled. No changes indicated.   Potassium, Albumin   04/02/2024: 4.3, 4.1   03/05/2024: 4.0, 3.9   01/30/2024: 4.1, 3.9    eNPCR   04/02/2024: 0.75   03/05/2024: 0.89   02/08/2024: 0.72  PHYSICAL EXAM  Exam Performed. Vital Signs Reviewed. Lungs - Clear. CV - Blood pressure noted. CV - RRR. EXT - No edema. EXT - No ulcers. AVF/AVG Positive thrill/bruit.  DIAGNOSIS Chief Complaint: N18.6 End stage renal disease  Patient is stable.   Patient data updated 04/11/2024 at 1:01 PM Signed By: Douglas Woodward Creighton, MD  on 04/11/2024 1:04:18 PM

## 2024-04-13 DIAGNOSIS — Z992 Dependence on renal dialysis: Secondary | ICD-10-CM | POA: Diagnosis not present

## 2024-04-13 DIAGNOSIS — N186 End stage renal disease: Secondary | ICD-10-CM | POA: Diagnosis not present

## 2024-04-13 DIAGNOSIS — N2581 Secondary hyperparathyroidism of renal origin: Secondary | ICD-10-CM | POA: Diagnosis not present

## 2024-04-16 DIAGNOSIS — N2581 Secondary hyperparathyroidism of renal origin: Secondary | ICD-10-CM | POA: Diagnosis not present

## 2024-04-16 DIAGNOSIS — N186 End stage renal disease: Secondary | ICD-10-CM | POA: Diagnosis not present

## 2024-04-16 DIAGNOSIS — Z992 Dependence on renal dialysis: Secondary | ICD-10-CM | POA: Diagnosis not present

## 2024-04-18 DIAGNOSIS — Z992 Dependence on renal dialysis: Secondary | ICD-10-CM | POA: Diagnosis not present

## 2024-04-18 DIAGNOSIS — N2581 Secondary hyperparathyroidism of renal origin: Secondary | ICD-10-CM | POA: Diagnosis not present

## 2024-04-18 DIAGNOSIS — N186 End stage renal disease: Secondary | ICD-10-CM | POA: Diagnosis not present

## 2024-04-20 DIAGNOSIS — N2581 Secondary hyperparathyroidism of renal origin: Secondary | ICD-10-CM | POA: Diagnosis not present

## 2024-04-20 DIAGNOSIS — Z992 Dependence on renal dialysis: Secondary | ICD-10-CM | POA: Diagnosis not present

## 2024-04-20 DIAGNOSIS — N186 End stage renal disease: Secondary | ICD-10-CM | POA: Diagnosis not present

## 2024-04-23 DIAGNOSIS — Z992 Dependence on renal dialysis: Secondary | ICD-10-CM | POA: Diagnosis not present

## 2024-04-23 DIAGNOSIS — N2581 Secondary hyperparathyroidism of renal origin: Secondary | ICD-10-CM | POA: Diagnosis not present

## 2024-04-23 DIAGNOSIS — N186 End stage renal disease: Secondary | ICD-10-CM | POA: Diagnosis not present

## 2024-04-25 DIAGNOSIS — Z992 Dependence on renal dialysis: Secondary | ICD-10-CM | POA: Diagnosis not present

## 2024-04-25 DIAGNOSIS — N2581 Secondary hyperparathyroidism of renal origin: Secondary | ICD-10-CM | POA: Diagnosis not present

## 2024-04-25 DIAGNOSIS — N186 End stage renal disease: Secondary | ICD-10-CM | POA: Diagnosis not present

## 2024-04-26 ENCOUNTER — Encounter: Payer: Self-pay | Admitting: Internal Medicine

## 2024-04-26 ENCOUNTER — Ambulatory Visit (INDEPENDENT_AMBULATORY_CARE_PROVIDER_SITE_OTHER): Admitting: Internal Medicine

## 2024-04-26 VITALS — BP 102/68 | HR 102 | Ht 68.0 in | Wt 266.2 lb

## 2024-04-26 DIAGNOSIS — N400 Enlarged prostate without lower urinary tract symptoms: Secondary | ICD-10-CM | POA: Diagnosis not present

## 2024-04-26 DIAGNOSIS — Z0001 Encounter for general adult medical examination with abnormal findings: Secondary | ICD-10-CM

## 2024-04-26 DIAGNOSIS — E66811 Obesity, class 1: Secondary | ICD-10-CM | POA: Diagnosis not present

## 2024-04-26 DIAGNOSIS — E6609 Other obesity due to excess calories: Secondary | ICD-10-CM | POA: Diagnosis not present

## 2024-04-26 DIAGNOSIS — Z6832 Body mass index (BMI) 32.0-32.9, adult: Secondary | ICD-10-CM | POA: Diagnosis not present

## 2024-04-26 DIAGNOSIS — Z0130 Encounter for examination of blood pressure without abnormal findings: Secondary | ICD-10-CM

## 2024-04-26 NOTE — Progress Notes (Signed)
 Established Patient Office Visit  Subjective:  Patient ID: Joseph Gilmore, male    DOB: 1971/01/10  Age: 53 y.o. MRN: [id number]  Chief Complaint  Patient presents with   Annual Exam    AWV lab results    No new complaints, here for CPE, lab review and medication refills. History and ROS limited by his autism. Failed to have previsit labs done.      No other concerns at this time.   Past Medical History:  Diagnosis Date   Autism    Chronic kidney disease    ESRD (end stage renal disease) (HCC)    Hypertension    Murmur    OCD (obsessive compulsive disorder)     Past Surgical History:  Procedure Laterality Date   A/V FISTULAGRAM Left 10/07/2020   Procedure: A/V FISTULAGRAM;  Surgeon: Jama Cordella [name], MD;  Location: [facility];  Service: Cardiovascular;  Laterality: Left;   A/V FISTULAGRAM Left 02/15/2023   Procedure: A/V Fistulagram;  Surgeon: Jama Cordella [name], MD;  Location: [facility];  Service: Cardiovascular;  Laterality: Left;   A/V FISTULAGRAM Left 11/28/2023   Procedure: A/V Fistulagram;  Surgeon: Jama Cordella [name], MD;  Location: [facility];  Service: Cardiovascular;  Laterality: Left;   A/V FISTULAGRAM Left 12/26/2023   Procedure: A/V Fistulagram;  Surgeon: Jama Cordella [name], MD;  Location: [facility];  Service: Cardiovascular;  Laterality: Left;   AV FISTULA PLACEMENT Left 07/15/2020   Procedure: ARTERIOVENOUS FISTULA CREATION;  Surgeon: Jama Cordella [name], MD;  Location: [facility];  Service: Vascular;  Laterality: Left;   AV FISTULA PLACEMENT Left 10/04/2023   Procedure: INSERTION OF ARTERIOVENOUS (AV) GORE-TEX GRAFT ARM (BRACHIAL AXILLARY);  Surgeon: Jama Cordella [name], MD;  Location: [facility];  Service: Vascular;  Laterality: Left;   DIALYSIS/PERMA CATHETER INSERTION N/A 04/06/2020   Procedure: DIALYSIS/PERMA CATHETER INSERTION;  Surgeon: Marea Selinda RAMAN, MD;  Location: [facility];  Service: Cardiovascular;   Laterality: N/A;   DIALYSIS/PERMA CATHETER INSERTION N/A 05/26/2020   Procedure: DIALYSIS/PERMA CATHETER INSERTION;  Surgeon: Jama Cordella [name], MD;  Location: [facility];  Service: Cardiovascular;  Laterality: N/A;   DIALYSIS/PERMA CATHETER INSERTION N/A 07/09/2020   Procedure: DIALYSIS/PERMA CATHETER INSERTION;  Surgeon: Marea Selinda RAMAN, MD;  Location: [facility];  Service: Cardiovascular;  Laterality: N/A;   DIALYSIS/PERMA CATHETER INSERTION N/A 01/08/2021   Procedure: DIALYSIS/PERMA CATHETER INSERTION;  Surgeon: Marea Selinda RAMAN, MD;  Location: [facility];  Service: Cardiovascular;  Laterality: N/A;   DIALYSIS/PERMA CATHETER INSERTION N/A 03/17/2023   Procedure: DIALYSIS/PERMA CATHETER INSERTION;  Surgeon: Jama Cordella [name], MD;  Location: [facility];  Service: Cardiovascular;  Laterality: N/A;   DIALYSIS/PERMA CATHETER REMOVAL N/A 03/15/2021   Procedure: DIALYSIS/PERMA CATHETER REMOVAL;  Surgeon: Marea Selinda RAMAN, MD;  Location: [facility];  Service: Cardiovascular;  Laterality: N/A;   REVISON OF ARTERIOVENOUS FISTULA Left 12/11/2020   Procedure: ARTERIOVENOUS (AV) FISTULA CREATION ( BRACHIAL CEPHALIC );  Surgeon: Jama Cordella [name], MD;  Location: [facility];  Service: Vascular;  Laterality: Left;   TEMPORARY DIALYSIS CATHETER N/A 03/15/2023   Procedure: TEMPORARY DIALYSIS CATHETER;  Surgeon: Jama Cordella [name], MD;  Location: [facility];  Service: Cardiovascular;  Laterality: N/A;    Social History   Socioeconomic History   Marital status: Single    Spouse name: Not on file   Number of children: Not on file   Years of  education: Not on file   Highest education level: Not on file  Occupational History   Not on file  Tobacco Use   Smoking status: Never    Passive exposure: Never   Smokeless tobacco: Never  Vaping Use   Vaping status: Never Used  Substance and Sexual Activity   Alcohol use: Never   Drug use: Never   Sexual activity:  Not Currently  Other Topics Concern   Not on file  Social History Narrative   Lives with father .   Social Drivers of Corporate investment banker Strain: Not on file  Food Insecurity: No Food Insecurity (05/10/2022)   Hunger Vital Sign    Worried About Running Out of Food in the Last Year: Never true    Ran Out of Food in the Last Year: Never true  Transportation Needs: No Transportation Needs (05/10/2022)   PRAPARE - Administrator, Civil Service (Medical): No    Lack of Transportation (Non-Medical): No  Physical Activity: Not on file  Stress: Not on file  Social Connections: Not on file  Intimate Partner Violence: Not on file    No family history on file.  Allergies  Allergen Reactions   Chlorhexidine  Other (See Comments)    Blisters (topical)    Outpatient Medications Prior to Visit  Medication Sig   calcium  acetate (PHOSLO ) 667 MG capsule Take 667 mg by mouth 3 (three) times daily with meals.   Cholecalciferol (VITAMIN D -3) 125 MCG (5000 UT) TABS Take 5,000 Units by mouth daily.   dutasteride  (AVODART ) 0.5 MG capsule TAKE ONE CAPSULE BY MOUTH DAILY   HYDROcodone -acetaminophen  (NORCO) 5-325 MG tablet Take 1-2 tablets by mouth every 6 (six) hours as needed for moderate pain (pain score 4-6) or severe pain (pain score 7-10).   Methoxy PEG-Epoetin  Beta (MIRCERA IJ) Mircera   midodrine (PROAMATINE) 2.5 MG tablet Take 2.5 mg by mouth See admin instructions. Take 2.5 mg by mouth on Tuesday, Thursday and Saturday at dialysis as needed for low blood pressure.   multivitamin (RENA-VIT) TABS tablet Take by mouth.   tamsulosin  (FLOMAX ) 0.4 MG CAPS capsule TAKE ONE CAPSULE BY MOUTH DAILY   No facility-administered medications prior to visit.    Review of Systems  Constitutional:  Positive for weight loss (12 lbs).  HENT: Negative.    Eyes: Negative.   Respiratory: Negative.    Cardiovascular: Negative.   Gastrointestinal: Negative.   Genitourinary: Negative.    Skin: Negative.        As in hpi  Neurological: Negative.   Endo/Heme/Allergies: Negative.        Objective:   BP 102/68   Pulse (!) 102   Ht 5' 8 (1.727 m)   Wt 266 lb 3.2 oz (120.7 kg)   SpO2 98%   BMI 40.48 kg/m   Vitals:   04/26/24 1457  BP: 102/68  Pulse: (!) 102  Height: 5' 8 (1.727 m)  Weight: 266 lb 3.2 oz (120.7 kg)  SpO2: 98%  BMI (Calculated): 40.49    Physical Exam Vitals reviewed.  Constitutional:      Appearance: Normal appearance. He is obese.  HENT:     Head: Normocephalic.     Left Ear: There is no impacted cerumen.     Nose: Nose normal.     Mouth/Throat:     Mouth: Mucous membranes are moist.     Pharynx: No posterior oropharyngeal erythema.  Eyes:     Extraocular Movements: Extraocular movements intact.  Pupils: Pupils are equal, round, and reactive to light.  Cardiovascular:     Rate and Rhythm: Regular rhythm.     Chest Wall: PMI is not displaced.     Pulses: Normal pulses.     Heart sounds: Normal heart sounds. No murmur heard. Pulmonary:     Effort: Pulmonary effort is normal.     Breath sounds: Normal air entry. Rhonchi present. No rales.  Chest:     Comments: Left HD catheter site fine. Abdominal:     General: Abdomen is flat. Bowel sounds are normal. There is no distension.     Palpations: Abdomen is soft. There is no hepatomegaly, splenomegaly or mass.     Tenderness: There is no abdominal tenderness.  Musculoskeletal:        General: Normal range of motion.     Cervical back: Normal range of motion and neck supple.     Right lower leg: No edema.     Left lower leg: No edema.  Skin:    General: Skin is warm and dry.  Neurological:     General: No focal deficit present.     Mental Status: He is alert and oriented to person, place, and time.     Cranial Nerves: No cranial nerve deficit.     Motor: No weakness.  Psychiatric:        Mood and Affect: Mood normal.        Behavior: Behavior normal.      No results  found for any visits on 04/26/24.  No results found for this or any previous visit (from the past 2160 hours).    Assessment & Plan:  Jonh was seen today for annual exam.  Encounter for general adult medical examination with abnormal findings  Class 1 obesity due to excess calories with serious comorbidity and body mass index (BMI) of 32.0 to 32.9 in adult  Benign prostatic hyperplasia without lower urinary tract symptoms    Problem List Items Addressed This Visit       Genitourinary   Benign prostatic hyperplasia without lower urinary tract symptoms   Other Visit Diagnoses       Encounter for general adult medical examination with abnormal findings    -  Primary     Class 1 obesity due to excess calories with serious comorbidity and body mass index (BMI) of 32.0 to 32.9 in adult           Return in 2 weeks (on 05/10/2024) for fu with labs prior.   Total time spent: 30 minutes  Sherrill Cinderella Perry, MD  04/26/2024   This document may have been prepared by [facility] Voice Recognition software and as such may include unintentional dictation errors.

## 2024-04-27 DIAGNOSIS — N186 End stage renal disease: Secondary | ICD-10-CM | POA: Diagnosis not present

## 2024-04-27 DIAGNOSIS — N2581 Secondary hyperparathyroidism of renal origin: Secondary | ICD-10-CM | POA: Diagnosis not present

## 2024-04-27 DIAGNOSIS — Z992 Dependence on renal dialysis: Secondary | ICD-10-CM | POA: Diagnosis not present

## 2024-04-28 DIAGNOSIS — Z992 Dependence on renal dialysis: Secondary | ICD-10-CM | POA: Diagnosis not present

## 2024-04-28 DIAGNOSIS — N186 End stage renal disease: Secondary | ICD-10-CM | POA: Diagnosis not present

## 2024-04-30 DIAGNOSIS — N2581 Secondary hyperparathyroidism of renal origin: Secondary | ICD-10-CM | POA: Diagnosis not present

## 2024-04-30 DIAGNOSIS — N186 End stage renal disease: Secondary | ICD-10-CM | POA: Diagnosis not present

## 2024-04-30 DIAGNOSIS — Z992 Dependence on renal dialysis: Secondary | ICD-10-CM | POA: Diagnosis not present

## 2024-05-02 DIAGNOSIS — N2581 Secondary hyperparathyroidism of renal origin: Secondary | ICD-10-CM | POA: Diagnosis not present

## 2024-05-02 DIAGNOSIS — Z992 Dependence on renal dialysis: Secondary | ICD-10-CM | POA: Diagnosis not present

## 2024-05-02 DIAGNOSIS — N186 End stage renal disease: Secondary | ICD-10-CM | POA: Diagnosis not present

## 2024-05-04 DIAGNOSIS — Z992 Dependence on renal dialysis: Secondary | ICD-10-CM | POA: Diagnosis not present

## 2024-05-04 DIAGNOSIS — N2581 Secondary hyperparathyroidism of renal origin: Secondary | ICD-10-CM | POA: Diagnosis not present

## 2024-05-04 DIAGNOSIS — N186 End stage renal disease: Secondary | ICD-10-CM | POA: Diagnosis not present

## 2024-05-06 ENCOUNTER — Other Ambulatory Visit

## 2024-05-06 DIAGNOSIS — E6609 Other obesity due to excess calories: Secondary | ICD-10-CM | POA: Diagnosis not present

## 2024-05-06 DIAGNOSIS — Z6832 Body mass index (BMI) 32.0-32.9, adult: Secondary | ICD-10-CM | POA: Diagnosis not present

## 2024-05-06 DIAGNOSIS — E66811 Obesity, class 1: Secondary | ICD-10-CM | POA: Diagnosis not present

## 2024-05-06 DIAGNOSIS — N400 Enlarged prostate without lower urinary tract symptoms: Secondary | ICD-10-CM | POA: Diagnosis not present

## 2024-05-06 DIAGNOSIS — Z0001 Encounter for general adult medical examination with abnormal findings: Secondary | ICD-10-CM | POA: Diagnosis not present

## 2024-05-07 DIAGNOSIS — Z992 Dependence on renal dialysis: Secondary | ICD-10-CM | POA: Diagnosis not present

## 2024-05-07 DIAGNOSIS — N186 End stage renal disease: Secondary | ICD-10-CM | POA: Diagnosis not present

## 2024-05-07 DIAGNOSIS — N2581 Secondary hyperparathyroidism of renal origin: Secondary | ICD-10-CM | POA: Diagnosis not present

## 2024-05-07 LAB — CBC WITH DIFF/PLATELET
Basophils Absolute: 0.1 x10E3/uL (ref 0.0–0.2)
Basos: 1 %
EOS (ABSOLUTE): 0.1 x10E3/uL (ref 0.0–0.4)
Eos: 2 %
Hematocrit: 45.4 % (ref 37.5–51.0)
Hemoglobin: 14.9 g/dL (ref 13.0–17.7)
Immature Grans (Abs): 0 x10E3/uL (ref 0.0–0.1)
Immature Granulocytes: 0 %
Lymphocytes Absolute: 1.9 x10E3/uL (ref 0.7–3.1)
Lymphs: 34 %
MCH: 27.2 pg (ref 26.6–33.0)
MCHC: 32.8 g/dL (ref 31.5–35.7)
MCV: 83 fL (ref 79–97)
Monocytes Absolute: 0.5 x10E3/uL (ref 0.1–0.9)
Monocytes: 10 %
Neutrophils Absolute: 3 x10E3/uL (ref 1.4–7.0)
Neutrophils: 53 %
Platelets: 316 x10E3/uL (ref 150–450)
RBC: 5.48 x10E6/uL (ref 4.14–5.80)
RDW: 13.4 % (ref 11.6–15.4)
WBC: 5.5 x10E3/uL (ref 3.4–10.8)

## 2024-05-07 LAB — PSA: Prostate Specific Ag, Serum: 0.4 ng/mL (ref 0.0–4.0)

## 2024-05-07 LAB — COMPREHENSIVE METABOLIC PANEL WITH GFR
ALT: 33 IU/L (ref 0–44)
AST: 32 IU/L (ref 0–40)
Albumin: 5.1 g/dL — ABNORMAL HIGH (ref 3.8–4.9)
Alkaline Phosphatase: 42 IU/L — ABNORMAL LOW (ref 44–121)
BUN/Creatinine Ratio: 18 (ref 9–20)
BUN: 18 mg/dL (ref 6–24)
Bilirubin Total: 0.6 mg/dL (ref 0.0–1.2)
CO2: 24 mmol/L (ref 20–29)
Calcium: 9.8 mg/dL (ref 8.7–10.2)
Chloride: 98 mmol/L (ref 96–106)
Creatinine, Ser: 1 mg/dL (ref 0.76–1.27)
Globulin, Total: 2.1 g/dL (ref 1.5–4.5)
Glucose: 103 mg/dL — ABNORMAL HIGH (ref 70–99)
Potassium: 3.8 mmol/L (ref 3.5–5.2)
Sodium: 140 mmol/L (ref 134–144)
Total Protein: 7.2 g/dL (ref 6.0–8.5)
eGFR: 91 mL/min/1.73 (ref >59)

## 2024-05-07 LAB — LIPID PANEL
Chol/HDL Ratio: 6.1 ratio — ABNORMAL HIGH (ref 0.0–5.0)
Cholesterol, Total: 159 mg/dL (ref 100–199)
HDL: 26 mg/dL — ABNORMAL LOW (ref >39)
LDL Chol Calc (NIH): 76 mg/dL (ref 0–99)
Triglycerides: 354 mg/dL — ABNORMAL HIGH (ref 0–149)
VLDL Cholesterol Cal: 57 mg/dL — ABNORMAL HIGH (ref 5–40)

## 2024-05-09 DIAGNOSIS — N2581 Secondary hyperparathyroidism of renal origin: Secondary | ICD-10-CM | POA: Diagnosis not present

## 2024-05-09 DIAGNOSIS — N186 End stage renal disease: Secondary | ICD-10-CM | POA: Diagnosis not present

## 2024-05-09 DIAGNOSIS — Z992 Dependence on renal dialysis: Secondary | ICD-10-CM | POA: Diagnosis not present

## 2024-05-10 ENCOUNTER — Ambulatory Visit (INDEPENDENT_AMBULATORY_CARE_PROVIDER_SITE_OTHER): Admitting: Internal Medicine

## 2024-05-10 ENCOUNTER — Ambulatory Visit: Payer: Self-pay | Admitting: Internal Medicine

## 2024-05-10 VITALS — BP 140/78 | HR 94 | Temp 98.9°F | Ht 68.0 in | Wt 268.6 lb

## 2024-05-10 DIAGNOSIS — E782 Mixed hyperlipidemia: Secondary | ICD-10-CM | POA: Diagnosis not present

## 2024-05-10 NOTE — Progress Notes (Signed)
 Established Patient Office Visit  Subjective:  Patient ID: Joseph Gilmore, male    DOB: 1971-08-29  Age: 53 y.o. MRN: [id number]  Chief Complaint  Patient presents with   Follow-up    2 weeks with lab results    No new complaints, here for lab review and medication refills. Labs reviewed and notable for well controlled cholesterol but elevated trigs, cmp notable for elevated fasting glucose in the prediabetic range. PSA and CBC normal.    No other concerns at this time.   Past Medical History:  Diagnosis Date   Autism    Chronic kidney disease    ESRD (end stage renal disease) (HCC)    Hypertension    Murmur    OCD (obsessive compulsive disorder)     Past Surgical History:  Procedure Laterality Date   A/V FISTULAGRAM Left 10/07/2020   Procedure: A/V FISTULAGRAM;  Surgeon: Jama Cordella [name], MD;  Location: [facility];  Service: Cardiovascular;  Laterality: Left;   A/V FISTULAGRAM Left 02/15/2023   Procedure: A/V Fistulagram;  Surgeon: Jama Cordella [name], MD;  Location: [facility];  Service: Cardiovascular;  Laterality: Left;   A/V FISTULAGRAM Left 11/28/2023   Procedure: A/V Fistulagram;  Surgeon: Jama Cordella [name], MD;  Location: [facility];  Service: Cardiovascular;  Laterality: Left;   A/V FISTULAGRAM Left 12/26/2023   Procedure: A/V Fistulagram;  Surgeon: Jama Cordella [name], MD;  Location: [facility];  Service: Cardiovascular;  Laterality: Left;   AV FISTULA PLACEMENT Left 07/15/2020   Procedure: ARTERIOVENOUS FISTULA CREATION;  Surgeon: Jama Cordella [name], MD;  Location: [facility];  Service: Vascular;  Laterality: Left;   AV FISTULA PLACEMENT Left 10/04/2023   Procedure: INSERTION OF ARTERIOVENOUS (AV) GORE-TEX GRAFT ARM (BRACHIAL AXILLARY);  Surgeon: Jama Cordella [name], MD;  Location: [facility];  Service: Vascular;  Laterality: Left;   DIALYSIS/PERMA CATHETER INSERTION N/A 04/06/2020   Procedure: DIALYSIS/PERMA CATHETER INSERTION;   Surgeon: Marea Selinda RAMAN, MD;  Location: [facility];  Service: Cardiovascular;  Laterality: N/A;   DIALYSIS/PERMA CATHETER INSERTION N/A 05/26/2020   Procedure: DIALYSIS/PERMA CATHETER INSERTION;  Surgeon: Jama Cordella [name], MD;  Location: [facility];  Service: Cardiovascular;  Laterality: N/A;   DIALYSIS/PERMA CATHETER INSERTION N/A 07/09/2020   Procedure: DIALYSIS/PERMA CATHETER INSERTION;  Surgeon: Marea Selinda RAMAN, MD;  Location: [facility];  Service: Cardiovascular;  Laterality: N/A;   DIALYSIS/PERMA CATHETER INSERTION N/A 01/08/2021   Procedure: DIALYSIS/PERMA CATHETER INSERTION;  Surgeon: Marea Selinda RAMAN, MD;  Location: [facility];  Service: Cardiovascular;  Laterality: N/A;   DIALYSIS/PERMA CATHETER INSERTION N/A 03/17/2023   Procedure: DIALYSIS/PERMA CATHETER INSERTION;  Surgeon: Jama Cordella [name], MD;  Location: [facility];  Service: Cardiovascular;  Laterality: N/A;   DIALYSIS/PERMA CATHETER REMOVAL N/A 03/15/2021   Procedure: DIALYSIS/PERMA CATHETER REMOVAL;  Surgeon: Marea Selinda RAMAN, MD;  Location: [facility];  Service: Cardiovascular;  Laterality: N/A;   REVISON OF ARTERIOVENOUS FISTULA Left 12/11/2020   Procedure: ARTERIOVENOUS (AV) FISTULA CREATION ( BRACHIAL CEPHALIC );  Surgeon: Jama Cordella [name], MD;  Location: [facility];  Service: Vascular;  Laterality: Left;   TEMPORARY DIALYSIS CATHETER N/A 03/15/2023   Procedure: TEMPORARY DIALYSIS CATHETER;  Surgeon: Jama Cordella [name], MD;  Location: [facility];  Service: Cardiovascular;  Laterality: N/A;    Social History   Socioeconomic History   Marital status: Single    Spouse name: Not on file  Number of children: Not on file   Years of education: Not on file   Highest education level: Not on file  Occupational History   Not on file  Tobacco Use   Smoking status: Never    Passive exposure: Never   Smokeless tobacco: Never  Vaping Use   Vaping status: Never Used   Substance and Sexual Activity   Alcohol use: Never   Drug use: Never   Sexual activity: Not Currently  Other Topics Concern   Not on file  Social History Narrative   Lives with father .   Social Drivers of Corporate investment banker Strain: Not on file  Food Insecurity: No Food Insecurity (05/10/2022)   Hunger Vital Sign    Worried About Running Out of Food in the Last Year: Never true    Ran Out of Food in the Last Year: Never true  Transportation Needs: No Transportation Needs (05/10/2022)   PRAPARE - Administrator, Civil Service (Medical): No    Lack of Transportation (Non-Medical): No  Physical Activity: Not on file  Stress: Not on file  Social Connections: Not on file  Intimate Partner Violence: Not on file    No family history on file.  Allergies  Allergen Reactions   Chlorhexidine  Other (See Comments)    Blisters (topical)    Outpatient Medications Prior to Visit  Medication Sig   Cholecalciferol (VITAMIN D -3) 125 MCG (5000 UT) TABS Take 5,000 Units by mouth daily.   dutasteride  (AVODART ) 0.5 MG capsule TAKE ONE CAPSULE BY MOUTH DAILY   Methoxy PEG-Epoetin  Beta (MIRCERA IJ) Mircera   midodrine (PROAMATINE) 2.5 MG tablet Take 2.5 mg by mouth See admin instructions. Take 2.5 mg by mouth on Tuesday, Thursday and Saturday at dialysis as needed for low blood pressure.   multivitamin (RENA-VIT) TABS tablet Take by mouth.   tamsulosin  (FLOMAX ) 0.4 MG CAPS capsule TAKE ONE CAPSULE BY MOUTH DAILY   calcium  acetate (PHOSLO ) 667 MG capsule Take 667 mg by mouth 3 (three) times daily with meals. (Patient not taking: Reported on 05/10/2024)   HYDROcodone -acetaminophen  (NORCO) 5-325 MG tablet Take 1-2 tablets by mouth every 6 (six) hours as needed for moderate pain (pain score 4-6) or severe pain (pain score 7-10). (Patient not taking: Reported on 05/10/2024)   No facility-administered medications prior to visit.    Review of Systems  Constitutional:  Positive for  weight loss (12 lbs).  HENT: Negative.    Eyes: Negative.   Respiratory: Negative.    Cardiovascular: Negative.   Gastrointestinal: Negative.   Genitourinary: Negative.   Skin: Negative.        As in hpi  Neurological: Negative.   Endo/Heme/Allergies: Negative.        Objective:   BP (!) 140/78   Pulse 94   Temp 98.9 F (37.2 C)   Ht 5' 8 (1.727 m)   Wt 268 lb 9.6 oz (121.8 kg)   SpO2 98%   BMI 40.84 kg/m   Vitals:   05/10/24 1511  BP: (!) 140/78  Pulse: 94  Temp: 98.9 F (37.2 C)  Height: 5' 8 (1.727 m)  Weight: 268 lb 9.6 oz (121.8 kg)  SpO2: 98%  BMI (Calculated): 40.85    Physical Exam Vitals reviewed.  Constitutional:      Appearance: Normal appearance. He is obese.  HENT:     Head: Normocephalic.     Left Ear: There is no impacted cerumen.     Nose: Nose normal.  Mouth/Throat:     Mouth: Mucous membranes are moist.     Pharynx: No posterior oropharyngeal erythema.  Eyes:     Extraocular Movements: Extraocular movements intact.     Pupils: Pupils are equal, round, and reactive to light.  Cardiovascular:     Rate and Rhythm: Regular rhythm.     Chest Wall: PMI is not displaced.     Pulses: Normal pulses.     Heart sounds: Normal heart sounds. No murmur heard. Pulmonary:     Effort: Pulmonary effort is normal.     Breath sounds: Normal air entry. Rhonchi present. No rales.  Chest:     Comments: Left HD catheter site fine. Abdominal:     General: Abdomen is flat. Bowel sounds are normal. There is no distension.     Palpations: Abdomen is soft. There is no hepatomegaly, splenomegaly or mass.     Tenderness: There is no abdominal tenderness.  Musculoskeletal:        General: Normal range of motion.     Cervical back: Normal range of motion and neck supple.     Right lower leg: No edema.     Left lower leg: No edema.  Skin:    General: Skin is warm and dry.  Neurological:     General: No focal deficit present.     Mental Status: He is  alert and oriented to person, place, and time.     Cranial Nerves: No cranial nerve deficit.     Motor: No weakness.  Psychiatric:        Mood and Affect: Mood normal.        Behavior: Behavior normal.      No results found for any visits on 05/10/24.  Recent Results (from the past 2160 hours)  Lipid panel     Status: Abnormal   Collection Time: 05/06/24  9:36 AM  Result Value Ref Range   Cholesterol, Total 159 100 - 199 mg/dL   Triglycerides 645 (H) 0 - 149 mg/dL   HDL 26 (L) >60 mg/dL   VLDL Cholesterol Cal 57 (H) 5 - 40 mg/dL   LDL Chol Calc (NIH) 76 0 - 99 mg/dL   Chol/HDL Ratio 6.1 (H) 0.0 - 5.0 ratio    Comment:                                   T. Chol/HDL Ratio                                             Men  Women                               1/2 Avg.Risk  3.4    3.3                                   Avg.Risk  5.0    4.4                                2X Avg.Risk  9.6    7.1  3X Avg.Risk 23.4   11.0   PSA     Status: None   Collection Time: 05/06/24  9:36 AM  Result Value Ref Range   Prostate Specific Ag, Serum 0.4 0.0 - 4.0 ng/mL    Comment: Roche ECLIA methodology. According to the American Urological Association, Serum PSA should decrease and remain at undetectable levels after radical prostatectomy. The AUA defines biochemical recurrence as an initial PSA value 0.2 ng/mL or greater followed by a subsequent confirmatory PSA value 0.2 ng/mL or greater. Values obtained with different assay methods or kits cannot be used interchangeably. Results cannot be interpreted as absolute evidence of the presence or absence of malignant disease.   Comprehensive metabolic panel with GFR     Status: Abnormal   Collection Time: 05/06/24  9:36 AM  Result Value Ref Range   Glucose 103 (H) 70 - 99 mg/dL   BUN 18 6 - 24 mg/dL   Creatinine, Ser 8.99 0.76 - 1.27 mg/dL   eGFR 91 >40 fO/fpw/8.26   BUN/Creatinine Ratio 18 9 - 20   Sodium 140 134 -  144 mmol/L   Potassium 3.8 3.5 - 5.2 mmol/L   Chloride 98 96 - 106 mmol/L   CO2 24 20 - 29 mmol/L   Calcium  9.8 8.7 - 10.2 mg/dL   Total Protein 7.2 6.0 - 8.5 g/dL   Albumin 5.1 (H) 3.8 - 4.9 g/dL   Globulin, Total 2.1 1.5 - 4.5 g/dL   Bilirubin Total 0.6 0.0 - 1.2 mg/dL   Alkaline Phosphatase 42 (L) 44 - 121 IU/L    Comment: **Effective May 13, 2024 Alkaline Phosphatase**   reference interval will be changing to:              Age                Male          Male           0 -  5 days         47 - 127       47 - 127           6 - 10 days         29 - 242       29 - 242          11 - 20 days        109 - 357      109 - 357          21 - 30 days         94 - 494       94 - 494           1 -  2 months      149 - 539      149 - 539           3 -  6 months      131 - 452      131 - 452           7 - 11 months      117 - 401      117 - 401   12 months -  6 years       158 - 369      158 - 369           7 - 12 years       150 - 409  150 - 409               13 years       156 - 435       78 - 227               14 years       114 - 375       64 - 161               15 years        88 - 279       56 - 134               16 years        74 - 207       51 - 121               17 years        63 - 161       47 - 113          18 - 20 years        51 - 125       42 - 106          21 - 50 years         47 - 123       41 - 116          51 - 80 years        49 - 135       51 - 125              >80 years        48 - 129       48 - 129    AST 32 0 - 40 IU/L   ALT 33 0 - 44 IU/L  CBC With Diff/Platelet     Status: None   Collection Time: 05/06/24  9:36 AM  Result Value Ref Range   WBC 5.5 3.4 - 10.8 x10E3/uL   RBC 5.48 4.14 - 5.80 x10E6/uL   Hemoglobin 14.9 13.0 - 17.7 g/dL   Hematocrit 54.5 62.4 - 51.0 %   MCV 83 79 - 97 fL   MCH 27.2 26.6 - 33.0 pg   MCHC 32.8 31.5 - 35.7 g/dL   RDW 86.5 88.3 - 84.5 %   Platelets 316 150 - 450 x10E3/uL   Neutrophils 53 Not Estab. %   Lymphs 34  Not Estab. %   Monocytes 10 Not Estab. %   Eos 2 Not Estab. %   Basos 1 Not Estab. %   Neutrophils Absolute 3.0 1.4 - 7.0 x10E3/uL   Lymphocytes Absolute 1.9 0.7 - 3.1 x10E3/uL   Monocytes Absolute 0.5 0.1 - 0.9 x10E3/uL   EOS (ABSOLUTE) 0.1 0.0 - 0.4 x10E3/uL   Basophils Absolute 0.1 0.0 - 0.2 x10E3/uL   Immature Granulocytes 0 Not Estab. %   Immature Grans (Abs) 0.0 0.0 - 0.1 x10E3/uL      Assessment & Plan:  As per problem list. Stricter low calorie diet, low cholesterol and low fat diet and exercise as much as possible  Problem List Items Addressed This Visit   None Visit Diagnoses       Mixed hyperlipidemia    -  Primary   Relevant Orders   Lipid panel       Return in about 3 months (around 08/09/2024) for BP followup, fu with labs  prior.   Total time spent: 20 minutes  Sherrill Cinderella Perry, MD  05/10/2024   This document may have been prepared by [facility] Voice Recognition software and as such may include unintentional dictation errors.

## 2024-05-11 DIAGNOSIS — N186 End stage renal disease: Secondary | ICD-10-CM | POA: Diagnosis not present

## 2024-05-11 DIAGNOSIS — Z992 Dependence on renal dialysis: Secondary | ICD-10-CM | POA: Diagnosis not present

## 2024-05-11 DIAGNOSIS — N2581 Secondary hyperparathyroidism of renal origin: Secondary | ICD-10-CM | POA: Diagnosis not present

## 2024-05-14 DIAGNOSIS — Z992 Dependence on renal dialysis: Secondary | ICD-10-CM | POA: Diagnosis not present

## 2024-05-14 DIAGNOSIS — N186 End stage renal disease: Secondary | ICD-10-CM | POA: Diagnosis not present

## 2024-05-14 DIAGNOSIS — N2581 Secondary hyperparathyroidism of renal origin: Secondary | ICD-10-CM | POA: Diagnosis not present

## 2024-05-16 DIAGNOSIS — N186 End stage renal disease: Secondary | ICD-10-CM | POA: Diagnosis not present

## 2024-05-16 DIAGNOSIS — Z992 Dependence on renal dialysis: Secondary | ICD-10-CM | POA: Diagnosis not present

## 2024-05-16 DIAGNOSIS — N2581 Secondary hyperparathyroidism of renal origin: Secondary | ICD-10-CM | POA: Diagnosis not present

## 2024-05-18 DIAGNOSIS — Z992 Dependence on renal dialysis: Secondary | ICD-10-CM | POA: Diagnosis not present

## 2024-05-18 DIAGNOSIS — N2581 Secondary hyperparathyroidism of renal origin: Secondary | ICD-10-CM | POA: Diagnosis not present

## 2024-05-18 DIAGNOSIS — N186 End stage renal disease: Secondary | ICD-10-CM | POA: Diagnosis not present

## 2024-05-21 DIAGNOSIS — N186 End stage renal disease: Secondary | ICD-10-CM | POA: Diagnosis not present

## 2024-05-21 DIAGNOSIS — N2581 Secondary hyperparathyroidism of renal origin: Secondary | ICD-10-CM | POA: Diagnosis not present

## 2024-05-21 DIAGNOSIS — Z992 Dependence on renal dialysis: Secondary | ICD-10-CM | POA: Diagnosis not present

## 2024-05-23 DIAGNOSIS — Z992 Dependence on renal dialysis: Secondary | ICD-10-CM | POA: Diagnosis not present

## 2024-05-23 DIAGNOSIS — N186 End stage renal disease: Secondary | ICD-10-CM | POA: Diagnosis not present

## 2024-05-23 DIAGNOSIS — N2581 Secondary hyperparathyroidism of renal origin: Secondary | ICD-10-CM | POA: Diagnosis not present

## 2024-05-25 DIAGNOSIS — N186 End stage renal disease: Secondary | ICD-10-CM | POA: Diagnosis not present

## 2024-05-25 DIAGNOSIS — Z992 Dependence on renal dialysis: Secondary | ICD-10-CM | POA: Diagnosis not present

## 2024-05-25 DIAGNOSIS — N2581 Secondary hyperparathyroidism of renal origin: Secondary | ICD-10-CM | POA: Diagnosis not present

## 2024-05-28 DIAGNOSIS — N2581 Secondary hyperparathyroidism of renal origin: Secondary | ICD-10-CM | POA: Diagnosis not present

## 2024-05-28 DIAGNOSIS — N186 End stage renal disease: Secondary | ICD-10-CM | POA: Diagnosis not present

## 2024-05-28 DIAGNOSIS — Z992 Dependence on renal dialysis: Secondary | ICD-10-CM | POA: Diagnosis not present

## 2024-05-30 DIAGNOSIS — Z992 Dependence on renal dialysis: Secondary | ICD-10-CM | POA: Diagnosis not present

## 2024-05-30 DIAGNOSIS — N186 End stage renal disease: Secondary | ICD-10-CM | POA: Diagnosis not present

## 2024-05-30 DIAGNOSIS — N2581 Secondary hyperparathyroidism of renal origin: Secondary | ICD-10-CM | POA: Diagnosis not present

## 2024-06-06 DIAGNOSIS — N186 End stage renal disease: Secondary | ICD-10-CM | POA: Diagnosis not present

## 2024-06-06 DIAGNOSIS — N2581 Secondary hyperparathyroidism of renal origin: Secondary | ICD-10-CM | POA: Diagnosis not present

## 2024-06-06 DIAGNOSIS — Z992 Dependence on renal dialysis: Secondary | ICD-10-CM | POA: Diagnosis not present

## 2024-06-08 DIAGNOSIS — N186 End stage renal disease: Secondary | ICD-10-CM | POA: Diagnosis not present

## 2024-06-11 DIAGNOSIS — Z992 Dependence on renal dialysis: Secondary | ICD-10-CM | POA: Diagnosis not present

## 2024-06-11 DIAGNOSIS — N2581 Secondary hyperparathyroidism of renal origin: Secondary | ICD-10-CM | POA: Diagnosis not present

## 2024-06-15 DIAGNOSIS — N186 End stage renal disease: Secondary | ICD-10-CM | POA: Diagnosis not present

## 2024-06-18 DIAGNOSIS — N2581 Secondary hyperparathyroidism of renal origin: Secondary | ICD-10-CM | POA: Diagnosis not present

## 2024-06-18 DIAGNOSIS — Z992 Dependence on renal dialysis: Secondary | ICD-10-CM | POA: Diagnosis not present

## 2024-06-18 DIAGNOSIS — N186 End stage renal disease: Secondary | ICD-10-CM | POA: Diagnosis not present

## 2024-06-19 ENCOUNTER — Ambulatory Visit (INDEPENDENT_AMBULATORY_CARE_PROVIDER_SITE_OTHER): Admitting: Nurse Practitioner

## 2024-06-19 ENCOUNTER — Encounter (INDEPENDENT_AMBULATORY_CARE_PROVIDER_SITE_OTHER): Payer: Self-pay | Admitting: Nurse Practitioner

## 2024-06-19 ENCOUNTER — Ambulatory Visit (INDEPENDENT_AMBULATORY_CARE_PROVIDER_SITE_OTHER)

## 2024-06-19 VITALS — BP 123/75 | HR 77 | Resp 16 | Ht 68.0 in | Wt 258.6 lb

## 2024-06-19 DIAGNOSIS — N186 End stage renal disease: Secondary | ICD-10-CM | POA: Diagnosis not present

## 2024-06-19 DIAGNOSIS — Z992 Dependence on renal dialysis: Secondary | ICD-10-CM | POA: Diagnosis not present

## 2024-06-19 DIAGNOSIS — I10 Essential (primary) hypertension: Secondary | ICD-10-CM

## 2024-06-19 DIAGNOSIS — Z7982 Long term (current) use of aspirin: Secondary | ICD-10-CM | POA: Diagnosis not present

## 2024-06-19 DIAGNOSIS — N400 Enlarged prostate without lower urinary tract symptoms: Secondary | ICD-10-CM | POA: Diagnosis not present

## 2024-06-19 DIAGNOSIS — F909 Attention-deficit hyperactivity disorder, unspecified type: Secondary | ICD-10-CM | POA: Diagnosis not present

## 2024-06-19 DIAGNOSIS — Z883 Allergy status to other anti-infective agents status: Secondary | ICD-10-CM | POA: Diagnosis not present

## 2024-06-19 DIAGNOSIS — N2581 Secondary hyperparathyroidism of renal origin: Secondary | ICD-10-CM | POA: Diagnosis not present

## 2024-06-19 DIAGNOSIS — I120 Hypertensive chronic kidney disease with stage 5 chronic kidney disease or end stage renal disease: Secondary | ICD-10-CM | POA: Diagnosis not present

## 2024-06-19 DIAGNOSIS — F429 Obsessive-compulsive disorder, unspecified: Secondary | ICD-10-CM | POA: Diagnosis not present

## 2024-06-19 DIAGNOSIS — F419 Anxiety disorder, unspecified: Secondary | ICD-10-CM | POA: Diagnosis not present

## 2024-06-19 DIAGNOSIS — Z8249 Family history of ischemic heart disease and other diseases of the circulatory system: Secondary | ICD-10-CM | POA: Diagnosis not present

## 2024-06-19 DIAGNOSIS — E785 Hyperlipidemia, unspecified: Secondary | ICD-10-CM | POA: Diagnosis not present

## 2024-06-19 NOTE — Progress Notes (Signed)
 Subjective:    Patient ID: Joseph Gilmore, male    DOB: 11-26-1970, 53 y.o.   MRN: [id number] Chief Complaint  Patient presents with   Post-op Problem    REFERRAL CAME IN FOR NEGATIVE BRUIT AND THRILL 1 month with an HDA study- post op    The patient presents today for follow-up evaluation of his left AV graft.  He had a referral placed for no thrill or bruit being found in May 2025.  The patient had follow-up scheduled however these were either canceled or no-showed.  Unfortunately the patient does require a caregiver, which is typically his father.  However, the patient's son is present today with notes that the patient's father has had a decline in his health and notably some memory issues.  This occurred during the time we will try to get the patient scheduled for follow-up.  He has several visits scheduled but these were either canceled or no-showed.  Since that time the patient's been using his PermCath.  Following discussion with the patient and and I suspect that his graft has not been used since May and that it has been occluded for just that long.  He notes that the PermCath is working well however.  He has previously had a brachiocephalic AV fistula in the left arm and 1 that became occluded it was transition to a brachial axillary AV graft.    Review of Systems  All other systems reviewed and are negative.      Objective:   Physical Exam Vitals reviewed.  HENT:     Head: Normocephalic.  Cardiovascular:     Rate and Rhythm: Normal rate.     Pulses: Normal pulses.  Pulmonary:     Effort: Pulmonary effort is normal.  Skin:    General: Skin is warm and dry.  Neurological:     Mental Status: He is alert and oriented to person, place, and time.  Psychiatric:        Mood and Affect: Mood normal.        Behavior: Behavior normal.        Thought Content: Thought content normal.     BP 123/75   Pulse 77   Resp 16   Ht 5' 8 (1.727 m)   Wt 258 lb 9.6 oz (117.3 kg)    BMI 39.32 kg/m   Past Medical History:  Diagnosis Date   Autism    Chronic kidney disease    ESRD (end stage renal disease) (HCC)    Hypertension    Murmur    OCD (obsessive compulsive disorder)     Social History   Socioeconomic History   Marital status: Single    Spouse name: Not on file   Number of children: Not on file   Years of education: Not on file   Highest education level: Not on file  Occupational History   Not on file  Tobacco Use   Smoking status: Never    Passive exposure: Never   Smokeless tobacco: Never  Vaping Use   Vaping status: Never Used  Substance and Sexual Activity   Alcohol use: Never   Drug use: Never   Sexual activity: Not Currently  Other Topics Concern   Not on file  Social History Narrative   Lives with father .   Social Drivers of Corporate investment banker Strain: Not on file  Food Insecurity: No Food Insecurity (05/10/2022)   Hunger Vital Sign    Worried About Running  Out of Food in the Last Year: Never true    Ran Out of Food in the Last Year: Never true  Transportation Needs: No Transportation Needs (05/10/2022)   PRAPARE - Administrator, Civil Service (Medical): No    Lack of Transportation (Non-Medical): No  Physical Activity: Not on file  Stress: Not on file  Social Connections: Not on file  Intimate Partner Violence: Not on file    Past Surgical History:  Procedure Laterality Date   A/V FISTULAGRAM Left 10/07/2020   Procedure: A/V FISTULAGRAM;  Surgeon: Jama Cordella [name], MD;  Location: [facility];  Service: Cardiovascular;  Laterality: Left;   A/V FISTULAGRAM Left 02/15/2023   Procedure: A/V Fistulagram;  Surgeon: Jama Cordella [name], MD;  Location: [facility];  Service: Cardiovascular;  Laterality: Left;   A/V FISTULAGRAM Left 11/28/2023   Procedure: A/V Fistulagram;  Surgeon: Jama Cordella [name], MD;  Location: [facility];  Service: Cardiovascular;  Laterality: Left;   A/V  FISTULAGRAM Left 12/26/2023   Procedure: A/V Fistulagram;  Surgeon: Jama Cordella [name], MD;  Location: [facility];  Service: Cardiovascular;  Laterality: Left;   AV FISTULA PLACEMENT Left 07/15/2020   Procedure: ARTERIOVENOUS FISTULA CREATION;  Surgeon: Jama Cordella [name], MD;  Location: [facility];  Service: Vascular;  Laterality: Left;   AV FISTULA PLACEMENT Left 10/04/2023   Procedure: INSERTION OF ARTERIOVENOUS (AV) GORE-TEX GRAFT ARM (BRACHIAL AXILLARY);  Surgeon: Jama Cordella [name], MD;  Location: [facility];  Service: Vascular;  Laterality: Left;   DIALYSIS/PERMA CATHETER INSERTION N/A 04/06/2020   Procedure: DIALYSIS/PERMA CATHETER INSERTION;  Surgeon: Marea Selinda RAMAN, MD;  Location: [facility];  Service: Cardiovascular;  Laterality: N/A;   DIALYSIS/PERMA CATHETER INSERTION N/A 05/26/2020   Procedure: DIALYSIS/PERMA CATHETER INSERTION;  Surgeon: Jama Cordella [name], MD;  Location: [facility];  Service: Cardiovascular;  Laterality: N/A;   DIALYSIS/PERMA CATHETER INSERTION N/A 07/09/2020   Procedure: DIALYSIS/PERMA CATHETER INSERTION;  Surgeon: Marea Selinda RAMAN, MD;  Location: [facility];  Service: Cardiovascular;  Laterality: N/A;   DIALYSIS/PERMA CATHETER INSERTION N/A 01/08/2021   Procedure: DIALYSIS/PERMA CATHETER INSERTION;  Surgeon: Marea Selinda RAMAN, MD;  Location: [facility];  Service: Cardiovascular;  Laterality: N/A;   DIALYSIS/PERMA CATHETER INSERTION N/A 03/17/2023   Procedure: DIALYSIS/PERMA CATHETER INSERTION;  Surgeon: Jama Cordella [name], MD;  Location: [facility];  Service: Cardiovascular;  Laterality: N/A;   DIALYSIS/PERMA CATHETER REMOVAL N/A 03/15/2021   Procedure: DIALYSIS/PERMA CATHETER REMOVAL;  Surgeon: Marea Selinda RAMAN, MD;  Location: [facility];  Service: Cardiovascular;  Laterality: N/A;   REVISON OF ARTERIOVENOUS FISTULA Left 12/11/2020   Procedure: ARTERIOVENOUS (AV) FISTULA CREATION ( BRACHIAL CEPHALIC );  Surgeon: Jama Cordella [name], MD;  Location: [facility];  Service: Vascular;  Laterality: Left;   TEMPORARY DIALYSIS CATHETER N/A 03/15/2023   Procedure: TEMPORARY DIALYSIS CATHETER;  Surgeon: Jama Cordella [name], MD;  Location: [facility];  Service: Cardiovascular;  Laterality: N/A;    History reviewed. No pertinent family history.  Allergies  Allergen Reactions   Chlorhexidine  Other (See Comments)    Blisters (topical)       Latest Ref Rng & Units 05/06/2024    9:36 AM 10/04/2023    2:33 PM 09/29/2023    2:22 PM  CBC  WBC 3.4 - 10.8 x10E3/uL 5.5   12.7   Hemoglobin 13.0 - 17.7 g/dL 85.0  9.2  8.9   Hematocrit 37.5 -  51.0 % 45.4  27.0  27.2   Platelets 150 - 450 x10E3/uL 316   180       CMP     Component Value Date/Time   NA 140 05/06/2024 0936   K 3.8 05/06/2024 0936   CL 98 05/06/2024 0936   CO2 24 05/06/2024 0936   GLUCOSE 103 (H) 05/06/2024 0936   GLUCOSE 88 10/04/2023 1433   BUN 18 05/06/2024 0936   CREATININE 1.00 05/06/2024 0936   CALCIUM  9.8 05/06/2024 0936   PROT 7.2 05/06/2024 0936   ALBUMIN 5.1 (H) 05/06/2024 0936   AST 32 05/06/2024 0936   ALT 33 05/06/2024 0936   ALKPHOS 42 (L) 05/06/2024 0936   BILITOT 0.6 05/06/2024 0936   EGFR 91 05/06/2024 0936   GFRNONAA 5 (L) 09/29/2023 1422     No results found.     Assessment & Plan:   1. ESRD (end stage renal disease) (HCC) (Primary) Recommend:  At this time the patient does not have appropriate extremity access for dialysis  Patient should have a right AV graft insertion.  However, I have advised that if it is found that during surgery he is noted to have a larger vein than what was noted a brachiocephalic fistula can be created instead.  The risks, benefits and alternative therapies were reviewed in detail with the patient.  All questions were answered.  The patient agrees to proceed with surgery.   The patient will follow up with me in the office after the surgery.  2. Essential hypertension Continue  antihypertensive medications as already ordered, these medications have been reviewed and there are no changes at this time.   Current Outpatient Medications on File Prior to Visit  Medication Sig Dispense Refill   Cholecalciferol (VITAMIN D -3) 125 MCG (5000 UT) TABS Take 5,000 Units by mouth daily.     dutasteride  (AVODART ) 0.5 MG capsule TAKE ONE CAPSULE BY MOUTH DAILY 90 capsule 3   Methoxy PEG-Epoetin  Beta (MIRCERA IJ) Mircera     midodrine (PROAMATINE) 2.5 MG tablet Take 2.5 mg by mouth See admin instructions. Take 2.5 mg by mouth on Tuesday, Thursday and Saturday at dialysis as needed for low blood pressure.     multivitamin (RENA-VIT) TABS tablet Take by mouth.     tamsulosin  (FLOMAX ) 0.4 MG CAPS capsule TAKE ONE CAPSULE BY MOUTH DAILY 90 capsule 3   calcium  acetate (PHOSLO ) 667 MG capsule Take 667 mg by mouth 3 (three) times daily with meals. (Patient not taking: Reported on 06/19/2024)     HYDROcodone -acetaminophen  (NORCO) 5-325 MG tablet Take 1-2 tablets by mouth every 6 (six) hours as needed for moderate pain (pain score 4-6) or severe pain (pain score 7-10). (Patient not taking: Reported on 06/19/2024) 30 tablet 0   No current facility-administered medications on file prior to visit.    There are no Patient Instructions on file for this visit. No follow-ups on file.   Gerron Guidotti E Keyvin Rison, NP

## 2024-06-19 NOTE — H&P (View-Only) (Signed)
 Subjective:    Patient ID: Joseph Gilmore, male    DOB: 11-26-1970, 53 y.o.   MRN: [id number] Chief Complaint  Patient presents with   Post-op Problem    REFERRAL CAME IN FOR NEGATIVE BRUIT AND THRILL 1 month with an HDA study- post op    The patient presents today for follow-up evaluation of his left AV graft.  He had a referral placed for no thrill or bruit being found in May 2025.  The patient had follow-up scheduled however these were either canceled or no-showed.  Unfortunately the patient does require a caregiver, which is typically his father.  However, the patient's son is present today with notes that the patient's father has had a decline in his health and notably some memory issues.  This occurred during the time we will try to get the patient scheduled for follow-up.  He has several visits scheduled but these were either canceled or no-showed.  Since that time the patient's been using his PermCath.  Following discussion with the patient and and I suspect that his graft has not been used since May and that it has been occluded for just that long.  He notes that the PermCath is working well however.  He has previously had a brachiocephalic AV fistula in the left arm and 1 that became occluded it was transition to a brachial axillary AV graft.    Review of Systems  All other systems reviewed and are negative.      Objective:   Physical Exam Vitals reviewed.  HENT:     Head: Normocephalic.  Cardiovascular:     Rate and Rhythm: Normal rate.     Pulses: Normal pulses.  Pulmonary:     Effort: Pulmonary effort is normal.  Skin:    General: Skin is warm and dry.  Neurological:     Mental Status: He is alert and oriented to person, place, and time.  Psychiatric:        Mood and Affect: Mood normal.        Behavior: Behavior normal.        Thought Content: Thought content normal.     BP 123/75   Pulse 77   Resp 16   Ht 5' 8 (1.727 m)   Wt 258 lb 9.6 oz (117.3 kg)    BMI 39.32 kg/m   Past Medical History:  Diagnosis Date   Autism    Chronic kidney disease    ESRD (end stage renal disease) (HCC)    Hypertension    Murmur    OCD (obsessive compulsive disorder)     Social History   Socioeconomic History   Marital status: Single    Spouse name: Not on file   Number of children: Not on file   Years of education: Not on file   Highest education level: Not on file  Occupational History   Not on file  Tobacco Use   Smoking status: Never    Passive exposure: Never   Smokeless tobacco: Never  Vaping Use   Vaping status: Never Used  Substance and Sexual Activity   Alcohol use: Never   Drug use: Never   Sexual activity: Not Currently  Other Topics Concern   Not on file  Social History Narrative   Lives with father .   Social Drivers of Corporate investment banker Strain: Not on file  Food Insecurity: No Food Insecurity (05/10/2022)   Hunger Vital Sign    Worried About Running  Out of Food in the Last Year: Never true    Ran Out of Food in the Last Year: Never true  Transportation Needs: No Transportation Needs (05/10/2022)   PRAPARE - Administrator, Civil Service (Medical): No    Lack of Transportation (Non-Medical): No  Physical Activity: Not on file  Stress: Not on file  Social Connections: Not on file  Intimate Partner Violence: Not on file    Past Surgical History:  Procedure Laterality Date   A/V FISTULAGRAM Left 10/07/2020   Procedure: A/V FISTULAGRAM;  Surgeon: Jama Cordella [name], MD;  Location: [facility];  Service: Cardiovascular;  Laterality: Left;   A/V FISTULAGRAM Left 02/15/2023   Procedure: A/V Fistulagram;  Surgeon: Jama Cordella [name], MD;  Location: [facility];  Service: Cardiovascular;  Laterality: Left;   A/V FISTULAGRAM Left 11/28/2023   Procedure: A/V Fistulagram;  Surgeon: Jama Cordella [name], MD;  Location: [facility];  Service: Cardiovascular;  Laterality: Left;   A/V  FISTULAGRAM Left 12/26/2023   Procedure: A/V Fistulagram;  Surgeon: Jama Cordella [name], MD;  Location: [facility];  Service: Cardiovascular;  Laterality: Left;   AV FISTULA PLACEMENT Left 07/15/2020   Procedure: ARTERIOVENOUS FISTULA CREATION;  Surgeon: Jama Cordella [name], MD;  Location: [facility];  Service: Vascular;  Laterality: Left;   AV FISTULA PLACEMENT Left 10/04/2023   Procedure: INSERTION OF ARTERIOVENOUS (AV) GORE-TEX GRAFT ARM (BRACHIAL AXILLARY);  Surgeon: Jama Cordella [name], MD;  Location: [facility];  Service: Vascular;  Laterality: Left;   DIALYSIS/PERMA CATHETER INSERTION N/A 04/06/2020   Procedure: DIALYSIS/PERMA CATHETER INSERTION;  Surgeon: Marea Selinda RAMAN, MD;  Location: [facility];  Service: Cardiovascular;  Laterality: N/A;   DIALYSIS/PERMA CATHETER INSERTION N/A 05/26/2020   Procedure: DIALYSIS/PERMA CATHETER INSERTION;  Surgeon: Jama Cordella [name], MD;  Location: [facility];  Service: Cardiovascular;  Laterality: N/A;   DIALYSIS/PERMA CATHETER INSERTION N/A 07/09/2020   Procedure: DIALYSIS/PERMA CATHETER INSERTION;  Surgeon: Marea Selinda RAMAN, MD;  Location: [facility];  Service: Cardiovascular;  Laterality: N/A;   DIALYSIS/PERMA CATHETER INSERTION N/A 01/08/2021   Procedure: DIALYSIS/PERMA CATHETER INSERTION;  Surgeon: Marea Selinda RAMAN, MD;  Location: [facility];  Service: Cardiovascular;  Laterality: N/A;   DIALYSIS/PERMA CATHETER INSERTION N/A 03/17/2023   Procedure: DIALYSIS/PERMA CATHETER INSERTION;  Surgeon: Jama Cordella [name], MD;  Location: [facility];  Service: Cardiovascular;  Laterality: N/A;   DIALYSIS/PERMA CATHETER REMOVAL N/A 03/15/2021   Procedure: DIALYSIS/PERMA CATHETER REMOVAL;  Surgeon: Marea Selinda RAMAN, MD;  Location: [facility];  Service: Cardiovascular;  Laterality: N/A;   REVISON OF ARTERIOVENOUS FISTULA Left 12/11/2020   Procedure: ARTERIOVENOUS (AV) FISTULA CREATION ( BRACHIAL CEPHALIC );  Surgeon: Jama Cordella [name], MD;  Location: [facility];  Service: Vascular;  Laterality: Left;   TEMPORARY DIALYSIS CATHETER N/A 03/15/2023   Procedure: TEMPORARY DIALYSIS CATHETER;  Surgeon: Jama Cordella [name], MD;  Location: [facility];  Service: Cardiovascular;  Laterality: N/A;    History reviewed. No pertinent family history.  Allergies  Allergen Reactions   Chlorhexidine  Other (See Comments)    Blisters (topical)       Latest Ref Rng & Units 05/06/2024    9:36 AM 10/04/2023    2:33 PM 09/29/2023    2:22 PM  CBC  WBC 3.4 - 10.8 x10E3/uL 5.5   12.7   Hemoglobin 13.0 - 17.7 g/dL 85.0  9.2  8.9   Hematocrit 37.5 -  51.0 % 45.4  27.0  27.2   Platelets 150 - 450 x10E3/uL 316   180       CMP     Component Value Date/Time   NA 140 05/06/2024 0936   K 3.8 05/06/2024 0936   CL 98 05/06/2024 0936   CO2 24 05/06/2024 0936   GLUCOSE 103 (H) 05/06/2024 0936   GLUCOSE 88 10/04/2023 1433   BUN 18 05/06/2024 0936   CREATININE 1.00 05/06/2024 0936   CALCIUM  9.8 05/06/2024 0936   PROT 7.2 05/06/2024 0936   ALBUMIN 5.1 (H) 05/06/2024 0936   AST 32 05/06/2024 0936   ALT 33 05/06/2024 0936   ALKPHOS 42 (L) 05/06/2024 0936   BILITOT 0.6 05/06/2024 0936   EGFR 91 05/06/2024 0936   GFRNONAA 5 (L) 09/29/2023 1422     No results found.     Assessment & Plan:   1. ESRD (end stage renal disease) (HCC) (Primary) Recommend:  At this time the patient does not have appropriate extremity access for dialysis  Patient should have a right AV graft insertion.  However, I have advised that if it is found that during surgery he is noted to have a larger vein than what was noted a brachiocephalic fistula can be created instead.  The risks, benefits and alternative therapies were reviewed in detail with the patient.  All questions were answered.  The patient agrees to proceed with surgery.   The patient will follow up with me in the office after the surgery.  2. Essential hypertension Continue  antihypertensive medications as already ordered, these medications have been reviewed and there are no changes at this time.   Current Outpatient Medications on File Prior to Visit  Medication Sig Dispense Refill   Cholecalciferol (VITAMIN D -3) 125 MCG (5000 UT) TABS Take 5,000 Units by mouth daily.     dutasteride  (AVODART ) 0.5 MG capsule TAKE ONE CAPSULE BY MOUTH DAILY 90 capsule 3   Methoxy PEG-Epoetin  Beta (MIRCERA IJ) Mircera     midodrine (PROAMATINE) 2.5 MG tablet Take 2.5 mg by mouth See admin instructions. Take 2.5 mg by mouth on Tuesday, Thursday and Saturday at dialysis as needed for low blood pressure.     multivitamin (RENA-VIT) TABS tablet Take by mouth.     tamsulosin  (FLOMAX ) 0.4 MG CAPS capsule TAKE ONE CAPSULE BY MOUTH DAILY 90 capsule 3   calcium  acetate (PHOSLO ) 667 MG capsule Take 667 mg by mouth 3 (three) times daily with meals. (Patient not taking: Reported on 06/19/2024)     HYDROcodone -acetaminophen  (NORCO) 5-325 MG tablet Take 1-2 tablets by mouth every 6 (six) hours as needed for moderate pain (pain score 4-6) or severe pain (pain score 7-10). (Patient not taking: Reported on 06/19/2024) 30 tablet 0   No current facility-administered medications on file prior to visit.    There are no Patient Instructions on file for this visit. No follow-ups on file.   Gerron Guidotti E Keyvin Rison, NP

## 2024-06-20 DIAGNOSIS — Z992 Dependence on renal dialysis: Secondary | ICD-10-CM | POA: Diagnosis not present

## 2024-06-20 DIAGNOSIS — N2581 Secondary hyperparathyroidism of renal origin: Secondary | ICD-10-CM | POA: Diagnosis not present

## 2024-06-22 DIAGNOSIS — Z992 Dependence on renal dialysis: Secondary | ICD-10-CM | POA: Diagnosis not present

## 2024-06-22 DIAGNOSIS — N186 End stage renal disease: Secondary | ICD-10-CM | POA: Diagnosis not present

## 2024-06-22 DIAGNOSIS — N2581 Secondary hyperparathyroidism of renal origin: Secondary | ICD-10-CM | POA: Diagnosis not present

## 2024-06-25 ENCOUNTER — Telehealth (INDEPENDENT_AMBULATORY_CARE_PROVIDER_SITE_OTHER): Payer: Self-pay

## 2024-06-25 DIAGNOSIS — N2581 Secondary hyperparathyroidism of renal origin: Secondary | ICD-10-CM | POA: Diagnosis not present

## 2024-06-25 DIAGNOSIS — N186 End stage renal disease: Secondary | ICD-10-CM | POA: Diagnosis not present

## 2024-06-25 DIAGNOSIS — Z992 Dependence on renal dialysis: Secondary | ICD-10-CM | POA: Diagnosis not present

## 2024-06-25 NOTE — Telephone Encounter (Signed)
 A fax was received from Barnie at [facility] for the patient to have permcath exchange and to fax the appt back to them. Patient has been scheduled for a 06/26/24 with a 10:30 am arrival time to the [facility]. Pre-procedure instructions will be faxed to attn Barnie at [facility]. This information has also been sent to Mychart.

## 2024-06-26 ENCOUNTER — Observation Stay

## 2024-06-26 ENCOUNTER — Encounter
Admission: RE | Disposition: A | Discharge status: Home / Self Care | Payer: Self-pay | Source: Home / Self Care | Attending: Vascular Surgery

## 2024-06-26 ENCOUNTER — Other Ambulatory Visit (INDEPENDENT_AMBULATORY_CARE_PROVIDER_SITE_OTHER): Payer: Self-pay | Admitting: Nurse Practitioner

## 2024-06-26 ENCOUNTER — Other Ambulatory Visit: Payer: Self-pay

## 2024-06-26 ENCOUNTER — Observation Stay
Admission: RE | Admit: 2024-06-26 | Discharge: 2024-06-27 | Disposition: A | Discharge status: Home / Self Care | Attending: Vascular Surgery | Admitting: Vascular Surgery

## 2024-06-26 ENCOUNTER — Encounter: Payer: Self-pay | Admitting: Vascular Surgery

## 2024-06-26 DIAGNOSIS — F840 Autistic disorder: Secondary | ICD-10-CM | POA: Insufficient documentation

## 2024-06-26 DIAGNOSIS — Z452 Encounter for adjustment and management of vascular access device: Secondary | ICD-10-CM | POA: Diagnosis not present

## 2024-06-26 DIAGNOSIS — R0989 Other specified symptoms and signs involving the circulatory and respiratory systems: Secondary | ICD-10-CM | POA: Diagnosis not present

## 2024-06-26 DIAGNOSIS — N186 End stage renal disease: Principal | ICD-10-CM | POA: Diagnosis present

## 2024-06-26 DIAGNOSIS — D631 Anemia in chronic kidney disease: Secondary | ICD-10-CM | POA: Insufficient documentation

## 2024-06-26 DIAGNOSIS — Z992 Dependence on renal dialysis: Secondary | ICD-10-CM | POA: Diagnosis not present

## 2024-06-26 DIAGNOSIS — G9341 Metabolic encephalopathy: Secondary | ICD-10-CM | POA: Insufficient documentation

## 2024-06-26 DIAGNOSIS — I120 Hypertensive chronic kidney disease with stage 5 chronic kidney disease or end stage renal disease: Secondary | ICD-10-CM | POA: Insufficient documentation

## 2024-06-26 DIAGNOSIS — R4182 Altered mental status, unspecified: Secondary | ICD-10-CM | POA: Diagnosis not present

## 2024-06-26 DIAGNOSIS — N400 Enlarged prostate without lower urinary tract symptoms: Secondary | ICD-10-CM | POA: Insufficient documentation

## 2024-06-26 DIAGNOSIS — T8249XA Other complication of vascular dialysis catheter, initial encounter: Secondary | ICD-10-CM | POA: Diagnosis not present

## 2024-06-26 HISTORY — PX: DIALYSIS/PERMA CATHETER INSERTION: CATH118288

## 2024-06-26 LAB — GLUCOSE, CAPILLARY
Glucose-Capillary: 90 mg/dL (ref 70–99)
Glucose-Capillary: 233 mg/dL — ABNORMAL HIGH (ref 70–99)
Glucose-Capillary: 223 mg/dL — ABNORMAL HIGH (ref 70–99)
Glucose-Capillary: 117 mg/dL — ABNORMAL HIGH (ref 70–99)

## 2024-06-26 LAB — BLOOD GAS, VENOUS
Acid-base deficit: 7.9 mmol/L — ABNORMAL HIGH (ref 0.0–2.0)
Bicarbonate: 18.3 mmol/L — ABNORMAL LOW (ref 20.0–28.0)
O2 Saturation: 49 %
Patient temperature: 37
pCO2, Ven: 39 mmHg — ABNORMAL LOW (ref 44–60)
pH, Ven: 7.28 (ref 7.25–7.43)
pO2, Ven: 37 mmHg (ref 32–45)

## 2024-06-26 LAB — CBC
HCT: 29.4 % — ABNORMAL LOW (ref 39.0–52.0)
Hemoglobin: 9.3 g/dL — ABNORMAL LOW (ref 13.0–17.0)
MCH: 31.6 pg (ref 26.0–34.0)
MCHC: 31.6 g/dL (ref 30.0–36.0)
MCV: 100 fL (ref 80.0–100.0)
Platelets: 232 K/uL (ref 150–400)
RBC: 2.94 MIL/uL — ABNORMAL LOW (ref 4.22–5.81)
RDW: 18.9 % — ABNORMAL HIGH (ref 11.5–15.5)
WBC: 15.4 K/uL — ABNORMAL HIGH (ref 4.0–10.5)
nRBC: 0 % (ref 0.0–0.2)

## 2024-06-26 LAB — COMPREHENSIVE METABOLIC PANEL WITH GFR
ALT: 99 U/L — ABNORMAL HIGH (ref 0–44)
AST: 92 U/L — ABNORMAL HIGH (ref 15–41)
Albumin: 3.1 g/dL — ABNORMAL LOW (ref 3.5–5.0)
Alkaline Phosphatase: 51 U/L (ref 38–126)
Anion gap: 27 — ABNORMAL HIGH (ref 5–15)
BUN: 39 mg/dL — ABNORMAL HIGH (ref 6–20)
CO2: 16 mmol/L — ABNORMAL LOW (ref 22–32)
Calcium: 7.5 mg/dL — ABNORMAL LOW (ref 8.9–10.3)
Chloride: 99 mmol/L (ref 98–111)
Creatinine, Ser: 15.98 mg/dL — ABNORMAL HIGH (ref 0.61–1.24)
GFR, Estimated: 3 mL/min — ABNORMAL LOW (ref >60)
Glucose, Bld: 252 mg/dL — ABNORMAL HIGH (ref 70–99)
Potassium: 3.2 mmol/L — ABNORMAL LOW (ref 3.5–5.1)
Sodium: 142 mmol/L (ref 135–145)
Total Bilirubin: 0.7 mg/dL (ref 0.0–1.2)
Total Protein: 6 g/dL — ABNORMAL LOW (ref 6.5–8.1)

## 2024-06-26 LAB — HIV ANTIBODY (ROUTINE TESTING W REFLEX): HIV Screen 4th Generation wRfx: NONREACTIVE

## 2024-06-26 LAB — MAGNESIUM: Magnesium: 2.4 mg/dL (ref 1.7–2.4)

## 2024-06-26 LAB — HEMOGLOBIN A1C
Hgb A1c MFr Bld: 5.2 % (ref 4.8–5.6)
Mean Plasma Glucose: 102.54 mg/dL

## 2024-06-26 LAB — MRSA NEXT GEN BY PCR, NASAL: MRSA by PCR Next Gen: NOT DETECTED

## 2024-06-26 LAB — POTASSIUM (ARMC VASCULAR LAB ONLY): Potassium (ARMC vascular lab): 4 mmol/L (ref 3.5–5.1)

## 2024-06-26 SURGERY — DIALYSIS/PERMA CATHETER INSERTION
Anesthesia: Moderate Sedation

## 2024-06-26 MED ORDER — CEFAZOLIN SODIUM-DEXTROSE 1-4 GM/50ML-% IV SOLN
1.0000 g | INTRAVENOUS | Status: AC
  Administered 2024-06-26: 1 g via INTRAVENOUS

## 2024-06-26 MED ORDER — VITAMIN D 25 MCG (1000 UNIT) PO TABS
5000.0000 [IU] | ORAL_TABLET | Freq: Every day | ORAL | Status: DC
  Administered 2024-06-27: 5000 [IU] via ORAL
  Filled 2024-06-26: qty 5

## 2024-06-26 MED ORDER — POTASSIUM CHLORIDE CRYS ER 20 MEQ PO TBCR: 20.0000 meq | EXTENDED_RELEASE_TABLET | Freq: Once | ORAL | Status: DC

## 2024-06-26 MED ORDER — PHENYLEPHRINE HCL-NACL 20-0.9 MG/250ML-% IV SOLN
INTRAVENOUS | Status: AC
  Filled 2024-06-26: qty 250

## 2024-06-26 MED ORDER — TAMSULOSIN HCL 0.4 MG PO CAPS
0.4000 mg | ORAL_CAPSULE | Freq: Every day | ORAL | Status: DC
  Administered 2024-06-27: 0.4 mg via ORAL
  Filled 2024-06-26: qty 1

## 2024-06-26 MED ORDER — MIDAZOLAM HCL 2 MG/ML PO SYRP: 8.0000 mg | ORAL_SOLUTION | Freq: Once | ORAL | Status: DC | PRN

## 2024-06-26 MED ORDER — NALOXONE HCL 2 MG/2ML IJ SOSY
PREFILLED_SYRINGE | INTRAMUSCULAR | Status: AC
  Filled 2024-06-26: qty 2

## 2024-06-26 MED ORDER — FAMOTIDINE 20 MG PO TABS: 40.0000 mg | ORAL_TABLET | Freq: Once | ORAL | Status: DC | PRN

## 2024-06-26 MED ORDER — HYDROMORPHONE HCL 1 MG/ML IJ SOLN: 1.0000 mg | Freq: Once | INTRAMUSCULAR | Status: DC | PRN

## 2024-06-26 MED ORDER — SODIUM CHLORIDE 0.9% FLUSH
10.0000 mL | Freq: Two times a day (BID) | INTRAVENOUS | Status: DC
  Administered 2024-06-27: 20 mL

## 2024-06-26 MED ORDER — PHENOL 1.4 % MT LIQD: 1.0000 | OROMUCOSAL | Status: DC | PRN

## 2024-06-26 MED ORDER — POTASSIUM CHLORIDE 10 MEQ/100ML IV SOLN
10.0000 meq | Freq: Once | INTRAVENOUS | Status: AC
  Administered 2024-06-26: 10 meq via INTRAVENOUS
  Filled 2024-06-26: qty 100

## 2024-06-26 MED ORDER — PHENYLEPHRINE HCL (PRESSORS) 10 MG/ML IV SOLN
INTRAVENOUS | Status: AC
  Filled 2024-06-26: qty 1

## 2024-06-26 MED ORDER — SODIUM CHLORIDE 0.9% FLUSH: 10.0000 mL | INTRAVENOUS | Status: DC | PRN

## 2024-06-26 MED ORDER — HEPARIN SODIUM (PORCINE) 10000 UNIT/ML IJ SOLN
INTRAMUSCULAR | Status: AC
  Filled 2024-06-26: qty 1

## 2024-06-26 MED ORDER — GUAIFENESIN-DM 100-10 MG/5ML PO SYRP: 15.0000 mL | ORAL_SOLUTION | ORAL | Status: DC | PRN

## 2024-06-26 MED ORDER — ONDANSETRON HCL 4 MG/2ML IJ SOLN: 4.0000 mg | Freq: Four times a day (QID) | INTRAMUSCULAR | Status: DC | PRN

## 2024-06-26 MED ORDER — FLUMAZENIL 0.5 MG/5ML IV SOLN
INTRAVENOUS | Status: DC | PRN
  Administered 2024-06-26: .5 mg via INTRAVENOUS

## 2024-06-26 MED ORDER — FLUMAZENIL 0.5 MG/5ML IV SOLN
INTRAVENOUS | Status: AC
  Filled 2024-06-26: qty 5

## 2024-06-26 MED ORDER — SODIUM CHLORIDE 0.9 % IV SOLN: INTRAVENOUS | Status: DC

## 2024-06-26 MED ORDER — LIDOCAINE-EPINEPHRINE (PF) 1 %-1:200000 IJ SOLN
INTRAMUSCULAR | Status: DC | PRN
  Administered 2024-06-26: 20 mL

## 2024-06-26 MED ORDER — DIPHENHYDRAMINE HCL 50 MG/ML IJ SOLN
INTRAMUSCULAR | Status: AC
  Filled 2024-06-26: qty 1

## 2024-06-26 MED ORDER — METHYLPREDNISOLONE SODIUM SUCC 125 MG IJ SOLR: 125.0000 mg | Freq: Once | INTRAMUSCULAR | Status: AC | PRN

## 2024-06-26 MED ORDER — LABETALOL HCL 5 MG/ML IV SOLN: 10.0000 mg | INTRAVENOUS | Status: DC | PRN

## 2024-06-26 MED ORDER — IODIXANOL 320 MG/ML IV SOLN
INTRAVENOUS | Status: DC | PRN
  Administered 2024-06-26: 5 mL

## 2024-06-26 MED ORDER — RENA-VITE PO TABS: 1.0000 | ORAL_TABLET | Freq: Every day | ORAL | Status: DC

## 2024-06-26 MED ORDER — DUTASTERIDE 0.5 MG PO CAPS
0.5000 mg | ORAL_CAPSULE | Freq: Every day | ORAL | Status: DC
Start: 2024-06-27 — End: 2024-06-27
  Administered 2024-06-27: 0.5 mg via ORAL
  Filled 2024-06-26: qty 1

## 2024-06-26 MED ORDER — CEFAZOLIN SODIUM-DEXTROSE 1-4 GM/50ML-% IV SOLN
INTRAVENOUS | Status: AC
  Filled 2024-06-26: qty 50

## 2024-06-26 MED ORDER — MIDODRINE HCL 5 MG PO TABS
2.5000 mg | ORAL_TABLET | ORAL | Status: DC
  Administered 2024-06-27: 2.5 mg via ORAL
  Filled 2024-06-26: qty 1

## 2024-06-26 MED ORDER — NALOXONE HCL 0.4 MG/ML IJ SOLN
INTRAMUSCULAR | Status: DC | PRN
  Administered 2024-06-26: 2 mg via INTRAVENOUS

## 2024-06-26 MED ORDER — FENTANYL CITRATE (PF) 50 MCG/ML IJ SOSY
PREFILLED_SYRINGE | INTRAMUSCULAR | Status: AC
  Filled 2024-06-26: qty 1

## 2024-06-26 MED ORDER — FENTANYL CITRATE (PF) 100 MCG/2ML IJ SOLN
INTRAMUSCULAR | Status: DC | PRN
  Administered 2024-06-26: 25 ug via INTRAVENOUS

## 2024-06-26 MED ORDER — METOPROLOL TARTRATE 5 MG/5ML IV SOLN: 2.0000 mg | INTRAVENOUS | Status: DC | PRN

## 2024-06-26 MED ORDER — NALOXONE HCL 0.4 MG/ML IJ SOLN: 0.4000 mg | INTRAMUSCULAR | Status: DC | PRN

## 2024-06-26 MED ORDER — DIPHENHYDRAMINE HCL 50 MG/ML IJ SOLN: 50.0000 mg | Freq: Once | INTRAMUSCULAR | Status: DC | PRN

## 2024-06-26 MED ORDER — ACETAMINOPHEN 325 MG RE SUPP: 325.0000 mg | RECTAL | Status: DC | PRN

## 2024-06-26 MED ORDER — ACETAMINOPHEN 325 MG PO TABS: 325.0000 mg | ORAL_TABLET | ORAL | Status: DC | PRN

## 2024-06-26 MED ORDER — MIDAZOLAM HCL (PF) 2 MG/2ML IJ SOLN
INTRAMUSCULAR | Status: DC | PRN
  Administered 2024-06-26: 1 mg via INTRAVENOUS

## 2024-06-26 MED ORDER — ORAL CARE MOUTH RINSE: 15.0000 mL | OROMUCOSAL | Status: DC | PRN

## 2024-06-26 MED ORDER — HEPARIN (PORCINE) IN NACL 1000-0.9 UT/500ML-% IV SOLN
INTRAVENOUS | Status: DC | PRN
  Administered 2024-06-26: 500 mL

## 2024-06-26 MED ORDER — HEPARIN SODIUM (PORCINE) 10000 UNIT/ML IJ SOLN
INTRAMUSCULAR | Status: DC | PRN
  Administered 2024-06-26: 10000 [IU]

## 2024-06-26 MED ORDER — INSULIN ASPART 100 UNIT/ML IJ SOLN
0.0000 [IU] | Freq: Three times a day (TID) | INTRAMUSCULAR | Status: DC
  Administered 2024-06-26: 5 [IU] via SUBCUTANEOUS
  Filled 2024-06-26: qty 1

## 2024-06-26 MED ORDER — HYDRALAZINE HCL 20 MG/ML IJ SOLN: 5.0000 mg | INTRAMUSCULAR | Status: DC | PRN

## 2024-06-26 MED ORDER — PANTOPRAZOLE SODIUM 40 MG PO TBEC
40.0000 mg | DELAYED_RELEASE_TABLET | Freq: Every day | ORAL | Status: DC
Start: 2024-06-26 — End: 2024-06-27
  Administered 2024-06-27: 40 mg via ORAL
  Filled 2024-06-26: qty 1

## 2024-06-26 MED ORDER — MIDAZOLAM HCL 2 MG/2ML IJ SOLN
INTRAMUSCULAR | Status: AC
  Filled 2024-06-26: qty 2

## 2024-06-26 MED ORDER — METHYLPREDNISOLONE SODIUM SUCC 125 MG IJ SOLR
INTRAMUSCULAR | Status: AC
Start: 2024-06-26 — End: 2024-06-26
  Administered 2024-06-26: 125 mg via INTRAVENOUS
  Filled 2024-06-26: qty 2

## 2024-06-26 MED ORDER — ALUM & MAG HYDROXIDE-SIMETH 200-200-20 MG/5ML PO SUSP: 15.0000 mL | ORAL | Status: DC | PRN

## 2024-06-26 MED ORDER — FLUMAZENIL 0.5 MG/5ML IV SOLN: 0.3000 mg | INTRAVENOUS | Status: DC | PRN

## 2024-06-26 MED ORDER — DIPHENHYDRAMINE HCL 50 MG/ML IJ SOLN
INTRAMUSCULAR | Status: DC | PRN
  Administered 2024-06-26: 50 mg via INTRAVENOUS

## 2024-06-26 MED ORDER — SODIUM CHLORIDE 0.9 % IV BOLUS
INTRAVENOUS | Status: DC | PRN
Start: 2024-06-26 — End: 2024-06-26
  Administered 2024-06-26: 50 mL via INTRAVENOUS

## 2024-06-26 SURGICAL SUPPLY — 8 items
BIOPATCH RED 1 DISK 7.0 (GAUZE/BANDAGES/DRESSINGS) IMPLANT
CATH CANNON HEMO 15FR 32 (HEMODIALYSIS SUPPLIES) IMPLANT
CATH PALINDROME-P 19CM W/VT (CATHETERS) IMPLANT
DERMABOND ADVANCED .7 DNX12 (GAUZE/BANDAGES/DRESSINGS) IMPLANT
GUIDEWIRE SUPER STIFF .035X180 (WIRE) IMPLANT
PACK ANGIOGRAPHY (CUSTOM PROCEDURE TRAY) ×1 IMPLANT
SUT MNCRL AB 4-0 PS2 18 (SUTURE) IMPLANT
SUT PROLENE 0 CT 1 30 (SUTURE) IMPLANT

## 2024-06-26 NOTE — Interval H&P Note (Signed)
 History and Physical Interval Note:  06/26/2024 11:50 AM  Joseph Gilmore  has presented today for surgery, with the diagnosis of Perma Cath Exchange   End Stage Renal.  The various methods of treatment have been discussed with the patient and family. After consideration of risks, benefits and other options for treatment, the patient has consented to  Procedure(s): DIALYSIS/PERMA CATHETER INSERTION (N/A) as a surgical intervention.  The patient's history has been reviewed, patient examined, no change in status, stable for surgery.  I have reviewed the patient's chart and labs.  Questions were answered to the patient's satisfaction.     [name]

## 2024-06-26 NOTE — Progress Notes (Signed)
 Patient has been intermittently somnolent and agitated post procedure.  Had uncomplicated permcath exchange. Only got 1 mg of versed  and 25 mcg of Fentanyl  as well as 50 mg of Benadryl . Was given narcan twice and Flumazenil twice.  Narcan had no response. Flumazenil did seem to awaken him but he remained agitated.   Non-focal. Understands and communicates OK but remains agitated and flails in the bed. Reaction to Benadryl ? Plan admission for observation Will reach out to hospitalists with his multiple medical issues.

## 2024-06-26 NOTE — Plan of Care (Signed)
  Problem: Clinical Measurements: Goal: Ability to maintain clinical measurements within normal limits will improve Outcome: Progressing Goal: Respiratory complications will improve Outcome: Progressing Goal: Cardiovascular complication will be avoided Outcome: Progressing   Problem: Safety: Goal: Ability to remain free from injury will improve Outcome: Progressing   Problem: Skin Integrity: Goal: Risk for impaired skin integrity will decrease Outcome: Progressing   Problem: Education: Goal: Knowledge of General Education information will improve Description: Including pain rating scale, medication(s)/side effects and non-pharmacologic comfort measures Outcome: Not Progressing   Problem: Health Behavior/Discharge Planning: Goal: Ability to manage health-related needs will improve Outcome: Not Progressing

## 2024-06-26 NOTE — Progress Notes (Signed)
 After moderate sedation end, pt noted to suddenly become hypotensive and tachycardic. Difficulty obtaining O2 sat and pt became difficult to arouse. Dr. Marea still at bedside and notified. Placed on 100% NRB. Charge RN called for assistance. Sedation reversal administered with improvement in arousal and vital signs. Pt placed back on stretcher and returned to recovery area. Hand off given to recovery RN.

## 2024-06-26 NOTE — Progress Notes (Signed)
 I came into the room after patient came back to the recovery area from the procedural lab, Joseph Gilmore was barely responsive to voice. Gave reversal meds and IV steroid per physician verbal orders. Patient started became more arousable after the round of medications, but was still very sleepy with a combination of agitation. BP was low and had an episode of apnea, but was breathing fine on room air after that. Patient will be admitted, Aunt is at bedside and is aware of plan.

## 2024-06-26 NOTE — Op Note (Signed)
 OPERATIVE NOTE    PRE-OPERATIVE DIAGNOSIS: 1. ESRD 2. Non-functional permcath  POST-OPERATIVE DIAGNOSIS: same as above  PROCEDURE: Fluoroscopic guidance for placement of catheter Placement of a 23 cm tip to cuff tunneled hemodialysis catheter via the left internal jugular vein and removal of previous catheter Venogram through catheter of the superior vena cava and right ventricular outflow tract  SURGEON: Selinda Gu, MD  ANESTHESIA:  Local with moderate conscious sedation for 15 minutes using 1 mg of Versed  and 25 mcg of Fentanyl   ESTIMATED BLOOD LOSS: 5 cc  FINDING(S): none  SPECIMEN(S):  None  INDICATIONS:   Patient is a 53 y.o.male who presents with non-functional dialysis catheter and ESRD.  The patient needs long term dialysis access for their ESRD, and a Permcath is necessary.  Risks and benefits are discussed and informed consent is obtained.    DESCRIPTION: After obtaining full informed written consent, the patient was brought back to the vascular suite. The patient received moderate conscious sedation during a face-to-face encounter with me present throughout the entire procedure and supervising the RN monitoring the vital signs, pulse oximetry, telemetry, and mental status throughout the entire procedure. The patient's existing catheter, left neck and chest were sterilely prepped and draped in a sterile surgical field was created.  The existing catheter was dissected free from the fibrous sheath securing the cuff with hemostats and blunt dissection.  A wire was placed. The existing catheter was then removed and the wire used to keep venous access. I selected a 23 cm tip to cuff tunneled dialysis catheter.  Using fluoroscopic guidance the catheter tips were parked in the right atrium. The appropriate distal connectors were placed. It withdrew blood well and flushed easily with heparinized saline and a concentrated heparin  solution was then placed. It was secured to the chest wall  with 2 Prolene sutures. A 4-0 Monocryl pursestring suture was placed around the exit site. Sterile dressings were placed.  Towards the end of the procedure, the patient became more agitated.  He also became a little tachycardic and the blood pressure reading seemed lower.  To ensure that there was no bleeding, a venogram was performed through the catheter.  Contrast was injected through the arterial port.  The superior vena cava appeared to be widely patent and the right ventricular outflow tract appeared fairly normal.  There was no evidence of bleeding.  He was given Romazicon and Narcan which really did not change his mental status much. The patient was taken to the recovery room in stable condition.  COMPLICATIONS: None  CONDITION: Stable  Selinda Gu 06/26/2024 1:51 PM   This note was created with Dragon Medical transcription system. Any errors in dictation are purely unintentional.

## 2024-06-26 NOTE — Consult Note (Signed)
 Initial Consultation Note   Patient: Joseph Gilmore FMW:[id number] DOB: Mar 26, 1971 PCP: Albina GORMAN Dine, MD DOA: 06/26/2024 DOS: the patient was seen and examined on 06/26/2024 Primary service: Marea Selinda GORMAN, MD  Referring physician: Dr. Marea Reason for consult: AMS  Assessment/Plan:  Acute metabolic encephalopathy - Likely secondary to oversedation secondary to worsen, agreed with your management of for the tramadol as needed.  Currently, patient's mentation has significant improved. - Other possible DDx, given his body habitus, I also suspect hypoventilation, ordered VBG to rule out CO2 retention and a checks x-ray to rule out any aspiration.  ESRD on HD TTS - Currently appears to be euvolemic - If mentation continue to improve likely can go home tomorrow and to have scheduled outpatient HD, otherwise we will consider nephrology consult for HD tomorrow.  BPH - Continue Flomax  and Proscar  Chronic anemia - Secondary to CKD - H&H stable  Morbid obesity - Estimated BMI> 35 - Calorie control recommended     TRH will continue to follow the patient.  HPI: Joseph Gilmore is a 53 y.o. male with past medical history of ESRD on HD TTS, HTN, OCD, morbid obesity, came to the hospital today for left IJ permacath placement secondary to malfunctioning left arm AV graft.  Patient received IV sedation of 1 dose of 2 mg Versed  for the procedure, he tolerated procedure well however he became somnolent afterwards.  Patient was given flumazenil x 2, at the time I saw the patient he is more awake.  Patient denied any chest pain shortness breath.  Review of Systems: As mentioned in the history of present illness. All other systems reviewed and are negative. Past Medical History:  Diagnosis Date   Autism    Chronic kidney disease    ESRD (end stage renal disease) (HCC)    Hypertension    Murmur    OCD (obsessive compulsive disorder)    Past Surgical History:  Procedure Laterality Date    A/V FISTULAGRAM Left 10/07/2020   Procedure: A/V FISTULAGRAM;  Surgeon: Jama Cordella [name], MD;  Location: [facility];  Service: Cardiovascular;  Laterality: Left;   A/V FISTULAGRAM Left 02/15/2023   Procedure: A/V Fistulagram;  Surgeon: Jama Cordella [name], MD;  Location: [facility];  Service: Cardiovascular;  Laterality: Left;   A/V FISTULAGRAM Left 11/28/2023   Procedure: A/V Fistulagram;  Surgeon: Jama Cordella [name], MD;  Location: [facility];  Service: Cardiovascular;  Laterality: Left;   A/V FISTULAGRAM Left 12/26/2023   Procedure: A/V Fistulagram;  Surgeon: Jama Cordella [name], MD;  Location: [facility];  Service: Cardiovascular;  Laterality: Left;   AV FISTULA PLACEMENT Left 07/15/2020   Procedure: ARTERIOVENOUS FISTULA CREATION;  Surgeon: Jama Cordella [name], MD;  Location: [facility];  Service: Vascular;  Laterality: Left;   AV FISTULA PLACEMENT Left 10/04/2023   Procedure: INSERTION OF ARTERIOVENOUS (AV) GORE-TEX GRAFT ARM (BRACHIAL AXILLARY);  Surgeon: Jama Cordella [name], MD;  Location: [facility];  Service: Vascular;  Laterality: Left;   DIALYSIS/PERMA CATHETER INSERTION N/A 04/06/2020   Procedure: DIALYSIS/PERMA CATHETER INSERTION;  Surgeon: Marea Selinda GORMAN, MD;  Location: [facility];  Service: Cardiovascular;  Laterality: N/A;   DIALYSIS/PERMA CATHETER INSERTION N/A 05/26/2020   Procedure: DIALYSIS/PERMA CATHETER INSERTION;  Surgeon: Jama Cordella [name], MD;  Location: [facility];  Service: Cardiovascular;  Laterality: N/A;   DIALYSIS/PERMA CATHETER INSERTION N/A 07/09/2020   Procedure: DIALYSIS/PERMA CATHETER INSERTION;  Surgeon: Marea Selinda GORMAN, MD;  Location: [facility];  Service: Cardiovascular;  Laterality: N/A;   DIALYSIS/PERMA CATHETER INSERTION N/A 01/08/2021   Procedure: DIALYSIS/PERMA CATHETER INSERTION;  Surgeon: Marea Selinda RAMAN, MD;  Location: [facility];  Service: Cardiovascular;  Laterality: N/A;   DIALYSIS/PERMA  CATHETER INSERTION N/A 03/17/2023   Procedure: DIALYSIS/PERMA CATHETER INSERTION;  Surgeon: Jama Cordella [name], MD;  Location: [facility];  Service: Cardiovascular;  Laterality: N/A;   DIALYSIS/PERMA CATHETER REMOVAL N/A 03/15/2021   Procedure: DIALYSIS/PERMA CATHETER REMOVAL;  Surgeon: Marea Selinda RAMAN, MD;  Location: [facility];  Service: Cardiovascular;  Laterality: N/A;   REVISON OF ARTERIOVENOUS FISTULA Left 12/11/2020   Procedure: ARTERIOVENOUS (AV) FISTULA CREATION ( BRACHIAL CEPHALIC );  Surgeon: Jama Cordella [name], MD;  Location: [facility];  Service: Vascular;  Laterality: Left;   TEMPORARY DIALYSIS CATHETER N/A 03/15/2023   Procedure: TEMPORARY DIALYSIS CATHETER;  Surgeon: Jama Cordella [name], MD;  Location: [facility];  Service: Cardiovascular;  Laterality: N/A;   Social History:  reports that he has never smoked. He has never been exposed to tobacco smoke. He has never used smokeless tobacco. He reports that he does not drink alcohol and does not use drugs.  Allergies  Allergen Reactions   Chlorhexidine  Other (See Comments)    Blisters (topical)    History reviewed. No pertinent family history.  Prior to Admission medications   Medication Sig Start Date End Date Taking? Authorizing Provider  Cholecalciferol (VITAMIN D -3) 125 MCG (5000 UT) TABS Take 5,000 Units by mouth daily.   Yes [provider]  dutasteride  (AVODART ) 0.5 MG capsule TAKE ONE CAPSULE BY MOUTH DAILY 07/24/23  Yes Stoioff, Glendia BROCKS, MD  midodrine (PROAMATINE) 2.5 MG tablet Take 2.5 mg by mouth See admin instructions. Take 2.5 mg by mouth on Tuesday, Thursday and Saturday at dialysis as needed for low blood pressure.   Yes [provider]  multivitamin (RENA-VIT) TABS tablet Take by mouth. 06/12/20  Yes [provider]  tamsulosin  (FLOMAX ) 0.4 MG CAPS capsule TAKE ONE CAPSULE BY MOUTH DAILY 07/24/23  Yes Stoioff, Glendia BROCKS, MD  calcium  acetate (PHOSLO ) 667 MG capsule  Take 667 mg by mouth 3 (three) times daily with meals. Patient not taking: Reported on 06/19/2024 11/03/23 11/01/24  [provider]  HYDROcodone -acetaminophen  (NORCO) 5-325 MG tablet Take 1-2 tablets by mouth every 6 (six) hours as needed for moderate pain (pain score 4-6) or severe pain (pain score 7-10). Patient not taking: Reported on 06/19/2024 10/04/23   Schnier, Cordella [name], MD  Methoxy PEG-Epoetin  Beta (MIRCERA IJ) Mircera 08/19/23 08/17/24  [provider]    Physical Exam: Vitals:   06/26/24 1415 06/26/24 1419 06/26/24 1422 06/26/24 1425  BP: (!) 53/38 (!) 86/74 (!) 151/102 (!) 151/105  Pulse:      Resp: (!) 30 (!) 27    Temp:      TempSrc:      SpO2:   92% 92%   Eyes: PERRL, lids and conjunctivae normal ENMT: Mucous membranes are moist. Posterior pharynx clear of any exudate or lesions.Normal dentition.  Neck: normal, supple, no masses, no thyromegaly Respiratory: clear to auscultation bilaterally, no wheezing, no crackles. Normal respiratory effort. No accessory muscle use.  Cardiovascular: Regular rate and rhythm, no murmurs / rubs / gallops. No extremity edema. 2+ pedal pulses. No carotid bruits.  Abdomen: no tenderness, no masses palpated. No hepatosplenomegaly. Bowel sounds positive.  Musculoskeletal: no clubbing / cyanosis. No joint deformity upper and lower extremities. Good ROM,  no contractures. Normal muscle tone.  Skin: no rashes, lesions, ulcers. No induration Neurologic: CN 2-12 grossly intact. Sensation intact, DTR normal.  Muscle strength 5/5 on both sides Psychiatric: Normal judgment and insight. Alert and oriented x 3. Normal mood.    Data Reviewed:  Today's blood work including potassium level, glucose level CBC reviewed. Family Communication: Patient's aunt at bedside Primary team communication: Case was discussed with Dr. Marea Thank you very much for involving us  in the care of your patient.  Author: Cort ONEIDA Mana, MD 06/26/2024 2:55 PM  For  on call review [email or web address].

## 2024-06-27 DIAGNOSIS — D631 Anemia in chronic kidney disease: Secondary | ICD-10-CM | POA: Diagnosis present

## 2024-06-27 DIAGNOSIS — R7989 Other specified abnormal findings of blood chemistry: Secondary | ICD-10-CM | POA: Diagnosis not present

## 2024-06-27 DIAGNOSIS — A4153 Sepsis due to Serratia: Secondary | ICD-10-CM | POA: Diagnosis present

## 2024-06-27 DIAGNOSIS — N400 Enlarged prostate without lower urinary tract symptoms: Secondary | ICD-10-CM | POA: Diagnosis present

## 2024-06-27 DIAGNOSIS — N2581 Secondary hyperparathyroidism of renal origin: Secondary | ICD-10-CM | POA: Diagnosis not present

## 2024-06-27 DIAGNOSIS — R7881 Bacteremia: Secondary | ICD-10-CM | POA: Diagnosis not present

## 2024-06-27 DIAGNOSIS — Z992 Dependence on renal dialysis: Secondary | ICD-10-CM | POA: Diagnosis not present

## 2024-06-27 DIAGNOSIS — Z23 Encounter for immunization: Secondary | ICD-10-CM | POA: Diagnosis present

## 2024-06-27 DIAGNOSIS — I120 Hypertensive chronic kidney disease with stage 5 chronic kidney disease or end stage renal disease: Secondary | ICD-10-CM | POA: Diagnosis present

## 2024-06-27 DIAGNOSIS — Z1152 Encounter for screening for COVID-19: Secondary | ICD-10-CM | POA: Diagnosis not present

## 2024-06-27 DIAGNOSIS — E66813 Obesity, class 3: Secondary | ICD-10-CM | POA: Diagnosis present

## 2024-06-27 DIAGNOSIS — F429 Obsessive-compulsive disorder, unspecified: Secondary | ICD-10-CM | POA: Diagnosis present

## 2024-06-27 DIAGNOSIS — Z79899 Other long term (current) drug therapy: Secondary | ICD-10-CM | POA: Diagnosis not present

## 2024-06-27 DIAGNOSIS — Y848 Other medical procedures as the cause of abnormal reaction of the patient, or of later complication, without mention of misadventure at the time of the procedure: Secondary | ICD-10-CM | POA: Diagnosis present

## 2024-06-27 DIAGNOSIS — R0602 Shortness of breath: Secondary | ICD-10-CM | POA: Diagnosis present

## 2024-06-27 DIAGNOSIS — I2489 Other forms of acute ischemic heart disease: Secondary | ICD-10-CM | POA: Diagnosis present

## 2024-06-27 DIAGNOSIS — I10 Essential (primary) hypertension: Secondary | ICD-10-CM | POA: Diagnosis not present

## 2024-06-27 DIAGNOSIS — T827XXA Infection and inflammatory reaction due to other cardiac and vascular devices, implants and grafts, initial encounter: Secondary | ICD-10-CM | POA: Diagnosis not present

## 2024-06-27 DIAGNOSIS — Z888 Allergy status to other drugs, medicaments and biological substances status: Secondary | ICD-10-CM | POA: Diagnosis not present

## 2024-06-27 DIAGNOSIS — F840 Autistic disorder: Secondary | ICD-10-CM | POA: Diagnosis present

## 2024-06-27 DIAGNOSIS — T829XXA Unspecified complication of cardiac and vascular prosthetic device, implant and graft, initial encounter: Secondary | ICD-10-CM | POA: Diagnosis not present

## 2024-06-27 DIAGNOSIS — Z792 Long term (current) use of antibiotics: Secondary | ICD-10-CM | POA: Diagnosis not present

## 2024-06-27 DIAGNOSIS — R079 Chest pain, unspecified: Secondary | ICD-10-CM | POA: Diagnosis not present

## 2024-06-27 DIAGNOSIS — T80211A Bloodstream infection due to central venous catheter, initial encounter: Secondary | ICD-10-CM | POA: Diagnosis present

## 2024-06-27 DIAGNOSIS — E1122 Type 2 diabetes mellitus with diabetic chronic kidney disease: Secondary | ICD-10-CM | POA: Diagnosis not present

## 2024-06-27 DIAGNOSIS — A419 Sepsis, unspecified organism: Secondary | ICD-10-CM | POA: Diagnosis not present

## 2024-06-27 DIAGNOSIS — Z452 Encounter for adjustment and management of vascular access device: Secondary | ICD-10-CM | POA: Diagnosis not present

## 2024-06-27 DIAGNOSIS — N186 End stage renal disease: Secondary | ICD-10-CM | POA: Diagnosis present

## 2024-06-27 DIAGNOSIS — R509 Fever, unspecified: Secondary | ICD-10-CM | POA: Diagnosis not present

## 2024-06-27 LAB — GLUCOSE, CAPILLARY
Glucose-Capillary: 118 mg/dL — ABNORMAL HIGH (ref 70–99)
Glucose-Capillary: 108 mg/dL — ABNORMAL HIGH (ref 70–99)

## 2024-06-27 MED ORDER — HEPARIN SODIUM (PORCINE) 1000 UNIT/ML IJ SOLN
INTRAMUSCULAR | Status: AC
  Filled 2024-06-27: qty 6

## 2024-06-27 NOTE — Discharge Summary (Signed)
 [facility]    Discharge Summary    Patient ID:  Joseph Gilmore MRN: [id number] DOB/AGE: Dec 31, 1970 53 y.o.  Admit date: 06/26/2024 Discharge date: 06/27/2024 Date of Surgery: 06/26/2024 Surgeon: Surgeon(s): Dew, Selinda RAMAN, MD  Admission Diagnosis: End stage renal disease (HCC) [N18.6] ESRD (end stage renal disease) (HCC) [N18.6]  Discharge Diagnoses:  End stage renal disease (HCC) [N18.6] ESRD (end stage renal disease) (HCC) [N18.6]  Secondary Diagnoses: Past Medical History:  Diagnosis Date   Autism    Chronic kidney disease    ESRD (end stage renal disease) (HCC)    Hypertension    Murmur    OCD (obsessive compulsive disorder)     Procedure(s): DIALYSIS/PERMA CATHETER INSERTION  Discharged Condition: good  HPI:  [name]  has presented today for surgery, with the diagnosis of Perma Cath Exchange for End Stage Renal disease. Patient currently on a Tuesday, Thursday Saturday dialysis schedule. Patient was in need of catheter exchange due to high pressures. Patient to be scheduled in the near future for right arm A/V Graft placement.   Hospital Course:  [name] is a 53 y.o. male is S/P Left  PRE-OPERATIVE DIAGNOSIS: 1. ESRD 2. Non-functional permcath   POST-OPERATIVE DIAGNOSIS: same as above   PROCEDURE: Fluoroscopic guidance for placement of catheter Placement of a 23 cm tip to cuff tunneled hemodialysis catheter via the left internal jugular vein and removal of previous catheter Venogram through catheter of the superior vena cava and right ventricular outflow tract  Patient possibly had a reaction to a combination of Versed  and fentanyl  he was given during the procedure yesterday.  Postprocedure he woke up confused and was admitted and suspended overnight in the hospital setting for observation.  This morning he is alert and oriented back at his baseline.  Voiding well eating well and ambulating well.  Patient be  discharged later today.  I spent greater than 60 minutes in developing, implementing, teaching and discharging this patient today.  Extubated: POD # 0 Physical Exam:  Alert notes x3, no acute distress Face: Symmetrical.  Tongue is midline. Neck: Trachea is midline.  No swelling or bruising. Cardiovascular: Regular rate and rhythm Pulmonary: Clear to auscultation bilaterally Abdomen: Soft, nontender, nondistended Left lower extremity: Thigh soft.  Calf soft.  Extremities warm distally toes.  Hard to palpate pedal pulses however the foot is warm is her good capillary refill. Right lower extremity: Thigh soft.  Calf soft.  Extremities warm distally toes.  Hard to palpate pedal pulses however the foot is warm is her good capillary refill. Neurological: No deficits noted   Post-op wounds:  clean, dry, intact or healing well  Pt. Ambulating, voiding and taking PO diet without difficulty. Pt pain controlled with PO pain meds.  Labs:  As below  Complications: none  Consults:  Treatment Team:  Jens Durand, MD Dennise Capri, MD  Significant Diagnostic Studies: CBC Lab Results  Component Value Date   WBC 15.4 (H) 06/26/2024   HGB 9.3 (L) 06/26/2024   HCT 29.4 (L) 06/26/2024   MCV 100.0 06/26/2024   PLT 232 06/26/2024    BMET    Component Value Date/Time   NA 142 06/26/2024 1541   NA 140 05/06/2024 0936   K 3.2 (L) 06/26/2024 1541   CL 99 06/26/2024 1541   CO2 16 (L) 06/26/2024 1541   GLUCOSE 252 (H) 06/26/2024 1541   BUN 39 (H) 06/26/2024 1541   BUN 18 05/06/2024 0936  CREATININE 15.98 (H) 06/26/2024 1541   CALCIUM  7.5 (L) 06/26/2024 1541   GFRNONAA 3 (L) 06/26/2024 1541   GFRAA 8 (L) 04/24/2020 1219   COAG Lab Results  Component Value Date   INR 1.1 12/09/2020   INR 1.1 07/13/2020   INR 1.2 04/06/2020     Disposition:  Discharge to :Home  Allergies as of 06/27/2024       Reactions   Chlorhexidine  Other (See Comments)   Blisters (topical)         Medication List     TAKE these medications    calcium  acetate 667 MG capsule Commonly known as: PHOSLO  Take 667 mg by mouth 3 (three) times daily with meals.   dutasteride  0.5 MG capsule Commonly known as: AVODART  TAKE ONE CAPSULE BY MOUTH DAILY   HYDROcodone -acetaminophen  5-325 MG tablet Commonly known as: Norco Take 1-2 tablets by mouth every 6 (six) hours as needed for moderate pain (pain score 4-6) or severe pain (pain score 7-10).   midodrine 2.5 MG tablet Commonly known as: PROAMATINE Take 2.5 mg by mouth See admin instructions. Take 2.5 mg by mouth on Tuesday, Thursday and Saturday at dialysis as needed for low blood pressure.   MIRCERA IJ Mircera   multivitamin Tabs tablet Take by mouth.   tamsulosin  0.4 MG Caps capsule Commonly known as: FLOMAX  TAKE ONE CAPSULE BY MOUTH DAILY   Vitamin D -3 125 MCG (5000 UT) Tabs Take 5,000 Units by mouth daily.       Verbal and written Discharge instructions given to the patient. Wound care per Discharge AVS   Signed: Gwendlyn JONELLE Shank, NP  06/27/2024, 11:44 AM

## 2024-06-27 NOTE — Progress Notes (Signed)
 Pt receives outpt HD at [facility] on TTS at 10:10am. Navigator following to assist with any HD needs.  Suzen Satchel Dialysis Navigator [phone]

## 2024-06-27 NOTE — Plan of Care (Signed)
  Problem: Clinical Measurements: Goal: Ability to maintain clinical measurements within normal limits will improve Outcome: Progressing Goal: Will remain free from infection Outcome: Progressing Goal: Diagnostic test results will improve Outcome: Progressing Goal: Cardiovascular complication will be avoided Outcome: Progressing   Problem: Activity: Goal: Risk for activity intolerance will decrease Outcome: Progressing   Problem: Pain Managment: Goal: General experience of comfort will improve and/or be controlled Outcome: Progressing   Problem: Safety: Goal: Ability to remain free from injury will improve Outcome: Progressing   Problem: Metabolic: Goal: Ability to maintain appropriate glucose levels will improve Outcome: Progressing   Problem: Skin Integrity: Goal: Risk for impaired skin integrity will decrease Outcome: Progressing

## 2024-06-27 NOTE — Progress Notes (Signed)
  Received patient in bed to unit.   Informed consent signed and in chart.    TX duration:1hr 40 min d/t alaming for 3+ hrs.   Multiple alarms from machine and from having new catheter, pt did not receive full tx d/t catheter not running well.  NP aware of above     catheter dressing changed   Transported back to floor  Hand-off given to patient's nurse. No acute distress noted    Access used: L HD Catheter  Access issues: poorly running  low BFR today. NP and vascular NP aware of above    Total UF removed: 0.3L Medication(s) given: none Post HD VS: wnl     Olivia Hurst LPN [facility]

## 2024-06-27 NOTE — Progress Notes (Signed)
 Discharge instructions reviewed with patient's caregiver, Gustav. All questions answered. Patient discharged to care of Aunt via wheelchair with nursing staff to personal vehicle. All belongings with patient (clothing only present).

## 2024-06-27 NOTE — Progress Notes (Signed)
 Progress Note    Joseph Gilmore  FMW:[id number] DOB: 11/26/1970  DOA: 06/26/2024 PCP: Albina GORMAN Dine, MD      Brief Narrative:    Medical records reviewed and are as summarized below:  Joseph Gilmore is a 53 y.o. male  with past medical history of ESRD on HD TTS, HTN, OCD, morbid obesity, who presented to the hospital because of malfunctioning permacath and known occluded left arm AV graft.  He was admitted by the vascular surgery team and a cuffed tunneled hemodialysis catheter via left IJ vein was placed on 06/26/2024.  Old nonfunctional permacath was removed as well.  IV Versed  and IV fentanyl  were given for sedation.  Postoperatively, patient became minimally responsive and lethargic.  He was given Narcan and flumazenil and was subsequently admitted to the ICU for further evaluation.  Hospitalist team was consulted to assist with management.       Assessment/Plan:   Principal Problem:   ESRD (end stage renal disease) (HCC)    Body mass index is 40.49 kg/m.  (Class III obesity)   Acute toxic encephalopathy: Resolved.  Probably related to sedation with Versed  and fentanyl .  S/p treatment with Narcan and flumazenil    Nonfunctioning PermCath, known left occluded AV graft: S/p placement of Tunneled hemodialysis catheter via left IJ on 06/26/2024. Vascular surgeon plans to discharge patient home today after hemodialysis.   ESRD: Patient is having hemodialysis today.  Follow-up with nephrologist.   Comorbidities include BPH, anemia of chronic disease, morbid obesity   Diet Order             Diet renal/carb modified with fluid restriction Diet-HS Snack? Nothing; Fluid restriction: 1200 mL Fluid; Room service appropriate? Yes; Fluid consistency: Thin  Diet effective now                                  Consultants: Vascular surgeon  Procedures: Placement of Tunneled hemodialysis catheter via left IJ on  06/26/2024    Medications:    cholecalciferol  5,000 Units Oral Daily   dutasteride   0.5 mg Oral Daily   insulin  aspart  0-15 Units Subcutaneous TID WC   midodrine  2.5 mg Oral Once per day on Tuesday Thursday Saturday   multivitamin  1 tablet Oral QHS   pantoprazole  40 mg Oral Daily   sodium chloride  flush  10-40 mL Intracatheter Q12H   tamsulosin   0.4 mg Oral Daily   Continuous Infusions:   Anti-infectives (From admission, onward)    Start     Dose/Rate Route Frequency Ordered Stop   06/26/24 1038  ceFAZolin  (ANCEF ) IVPB 1 g/50 mL premix        1 g 100 mL/hr over 30 Minutes Intravenous 30 min pre-op  06/26/24 1038 06/26/24 1300              Family Communication/Anticipated D/C date and plan/Code Status   DVT prophylaxis: SCDs Start: 06/26/24 1438     Code Status: Full Code  Family Communication: None Disposition Plan: Plan to discharge home         Subjective:   He was seen on the hemodialysis unit on hemodialysis was ongoing at time of this visit.  Interval events noted.  He has no complaints.  No shortness of breath or chest pain.  No pain in the extremities.  Dialysis nurse at the bedside.  Objective:    Vitals:  06/27/24 0916 06/27/24 0937 06/27/24 1000 06/27/24 1030  BP: (!) 145/102 (!) 150/100 (!) 148/100 (!) 141/92  Pulse: (!) 108 (!) 111 (!) 112 (!) 107  Resp: (!) 24 (!) 25 (!) 24 (!) 29  Temp: 98.9 F (37.2 C)     TempSrc: Oral     SpO2: 100% 98% 99% 100%  Weight: 120.8 kg     Height:       No data found.  No intake or output data in the 24 hours ending 06/27/24 1101 Filed Weights   06/26/24 1525 06/27/24 0916  Weight: 120.7 kg 120.8 kg    Exam:  GEN: NAD SKIN: Warm and dry.  Left IJ PermCath in place EYES: No pallor or icterus ENT: MMM CV: RRR PULM: CTA B ABD: soft, obese, NT, +BS CNS: AAO x 3, non focal EXT: No edema or tenderness        Data Reviewed:   I have personally reviewed following labs and  imaging studies:  Labs: Labs show the following:   Basic Metabolic Panel: Recent Labs  Lab 06/26/24 1541  NA 142  K 3.2*  CL 99  CO2 16*  GLUCOSE 252*  BUN 39*  CREATININE 15.98*  CALCIUM  7.5*  MG 2.4   GFR Estimated Creatinine Clearance: 6.8 mL/min (A) (by C-G formula based on SCr of 15.98 mg/dL (H)). Liver Function Tests: Recent Labs  Lab 06/26/24 1541  AST 92*  ALT 99*  ALKPHOS 51  BILITOT 0.7  PROT 6.0*  ALBUMIN 3.1*   No results for input(s): LIPASE, AMYLASE in the last 168 hours. No results for input(s): AMMONIA in the last 168 hours. Coagulation profile No results for input(s): INR, PROTIME in the last 168 hours.  CBC: Recent Labs  Lab 06/26/24 1541  WBC 15.4*  HGB 9.3*  HCT 29.4*  MCV 100.0  PLT 232   Cardiac Enzymes: No results for input(s): CKTOTAL, CKMB, CKMBINDEX, TROPONINI in the last 168 hours. BNP (last 3 results) No results for input(s): PROBNP in the last 8760 hours. CBG: Recent Labs  Lab 06/26/24 1309 06/26/24 1516 06/26/24 1638 06/26/24 2228 06/27/24 0731  GLUCAP 90 233* 223* 117* 118*   D-Dimer: No results for input(s): DDIMER in the last 72 hours. Hgb A1c: Recent Labs    06/26/24 1541  HGBA1C 5.2   Lipid Profile: No results for input(s): CHOL, HDL, LDLCALC, TRIG, CHOLHDL, LDLDIRECT in the last 72 hours. Thyroid function studies: No results for input(s): TSH, T4TOTAL, T3FREE, THYROIDAB in the last 72 hours.  Invalid input(s): FREET3 Anemia work up: No results for input(s): VITAMINB12, FOLATE, FERRITIN, TIBC, IRON, RETICCTPCT in the last 72 hours. Sepsis Labs: Recent Labs  Lab 06/26/24 1541  WBC 15.4*    Microbiology Recent Results (from the past 240 hours)  MRSA Next Gen by PCR, Nasal     Status: None   Collection Time: 06/26/24  3:19 PM   Specimen: Nasal Mucosa; Nasal Swab  Result Value Ref Range Status   MRSA by PCR Next Gen NOT DETECTED NOT DETECTED  Final    Comment: (NOTE) The GeneXpert MRSA Assay (FDA approved for NASAL specimens only), is one component of a comprehensive MRSA colonization surveillance program. It is not intended to diagnose MRSA infection nor to guide or monitor treatment for MRSA infections. Test performance is not FDA approved in patients less than 19 years old. Performed at [facility], [address], [place], [place] [address]     Procedures and diagnostic studies:  DG  Chest 1 View Result Date: 06/26/2024 EXAM: 1 VIEW(S) XRAY OF THE CHEST 06/26/2024 03:54:00 PM COMPARISON: None available. CLINICAL HISTORY: AMS (altered mental status) [id number]. AMS FINDINGS: LINES, TUBES AND DEVICES: Left internal jugular central venous catheter in place with tip at cavoatrial junction. Left subclavian and left axillary vascular stents noted. LUNGS AND PLEURA: Low lung volumes. No focal pulmonary opacity. No pulmonary edema. No pleural effusion. No pneumothorax. HEART AND MEDIASTINUM: No acute abnormality of the cardiac and mediastinal silhouettes. BONES AND SOFT TISSUES: No acute osseous abnormality. IMPRESSION: 1. No acute findings. 2. Left internal jugular central venous catheter in place with tip at cavoatrial junction. 3. Left subclavian and left axillary vascular stents noted. Electronically signed by: Dorethia Molt MD 06/26/2024 07:14 PM EDT RP Workstation: [id number]   PERIPHERAL VASCULAR CATHETERIZATION Result Date: 06/26/2024 See surgical note for result.              LOS: 0 days   Advith Martine  [facility]   Pager on [email or web address]. If 7PM-7AM, please contact night-coverage at [email or web address]     06/27/2024, 11:01 AM

## 2024-06-27 NOTE — TOC Progression Note (Signed)
 Transition of Care [facility]) - Progression Note    Patient Details  Name: Joseph Gilmore MRN: [id number] Date of Birth: 03-Aug-1971  Transition of Care [facility]) CM/SW Contact  Corrie JINNY Ruts, LCSW Phone Number: 06/27/2024, 11:02 AM  Clinical Narrative:    Chart reviewed. Patient is currently in Dialysis. SW will follow up with the patient at a later time.                      Expected Discharge Plan and Services                                               Social Drivers of Health (SDOH) Interventions SDOH Screenings   Food Insecurity: Patient Unable To Answer (06/26/2024)  Housing: Patient Unable To Answer (06/26/2024)  Transportation Needs: Patient Unable To Answer (06/26/2024)  Utilities: Patient Unable To Answer (06/26/2024)  Depression (PHQ2-9): Low Risk  (04/30/2024)  Tobacco Use: Low Risk  (06/26/2024)    Readmission Risk Interventions     No data to display

## 2024-06-27 NOTE — Discharge Instructions (Addendum)
 Vascular Surgery Discharge Instructions:  Do not shower while you have permacath in place.  Continue outpatient dialysis as previously scheduled on Tuesday, Thursday and Saturdays.  Patient was just previously seen in clinic and will be scheduled for right arm A/V graft placement.

## 2024-06-27 NOTE — Progress Notes (Signed)
 Joseph Gilmore and Vascular Surgery  Daily Progress Note   Subjective  -   Awake, alert, in no major events overnight.  Patient appears to be back to his baseline mental functioning.  Objective Vitals:   06/27/24 0230 06/27/24 0245 06/27/24 0300 06/27/24 0400  BP: 124/76 (!) 143/97 131/84 (!) 147/134  Pulse: 92 95 97 97  Resp:      Temp:    98.6 F (37 C)  TempSrc:    Oral  SpO2: 99% 100% 97% 100%  Weight:      Height:       No intake or output data in the 24 hours ending 06/27/24 0753  PULM  CTAB CV  RRR VASC  PermCath in left chest without erythema or drainage.  Laboratory CBC    Component Value Date/Time   WBC 15.4 (H) 06/26/2024 1541   HGB 9.3 (L) 06/26/2024 1541   HGB 14.9 05/06/2024 0936   HCT 29.4 (L) 06/26/2024 1541   HCT 45.4 05/06/2024 0936   PLT 232 06/26/2024 1541   PLT 316 05/06/2024 0936    BMET    Component Value Date/Time   NA 142 06/26/2024 1541   NA 140 05/06/2024 0936   K 3.2 (L) 06/26/2024 1541   CL 99 06/26/2024 1541   CO2 16 (L) 06/26/2024 1541   GLUCOSE 252 (H) 06/26/2024 1541   BUN 39 (H) 06/26/2024 1541   BUN 18 05/06/2024 0936   CREATININE 15.98 (H) 06/26/2024 1541   CALCIUM  7.5 (L) 06/26/2024 1541   GFRNONAA 3 (L) 06/26/2024 1541   GFRAA 8 (L) 04/24/2020 1219    Assessment/Planning: POD #1 s/p PermCath exchange  Mental status appears to be back to his baseline. Suspect this was a paradoxical reaction to Benadryl  and once this was out of his system he returned to normal by yesterday evening. If he can get dialysis before discharge today that would be helpful Plan discharge home today   Selinda Gu  06/27/2024, 7:53 AM

## 2024-06-27 NOTE — Plan of Care (Signed)

## 2024-06-27 NOTE — Progress Notes (Signed)
 D/C order noted. Contacted [facility] TTS @ 10:10am advised of pt and d/c today that the pat should resume care on 06/29/2024. Requested documents faxed to clinic for continuation of care.  Suzen Satchel Dialysis Navigator [phone]

## 2024-06-27 NOTE — Progress Notes (Signed)
 [facility]  ROUNDING NOTE   Subjective:   Joseph Gilmore is a 53 y.o. male with past medical history of autism, hypertension, OCD, and end stage renal disease on hemodialysis. Patient presented for scheduled permcath exchange with vascular surgery. After procedure, patient became agitated with intermittent somnolence. Required Narcan without sufficient response. He was admitted overnight for observation.   Patient is known to our practice and receives outpatient dialysis treatments at [facility] on a TTS schedule, supervised by Dr Douglas. Last treatment received on Tuesday, partial treatment due to malfunctioning dialysis catheter.   Labs concerning serum bicarb 16, creatinine 15.98 with GFR 3, calcium  7.5, white count 15.4 with hemoglobin 9.3.  Chest x-ray negative for acute findings.  We have been consulted to manage dialysis needs.  Objective:  Vital signs in last 24 hours:  Temp:  [96.7 F (35.9 C)-100 F (37.8 C)] 98.9 F (37.2 C) (10/30 0916) Pulse Rate:  [0-116] 108 (10/30 0916) Resp:  [7-30] 24 (10/30 0916) BP: (51-151)/(26-134) 145/102 (10/30 0916) SpO2:  [90 %-100 %] 100 % (10/30 0916) Weight:  [120.7 kg-120.8 kg] 120.8 kg (10/30 0916)  Weight change:  Filed Weights   06/26/24 1525 06/27/24 0916  Weight: 120.7 kg 120.8 kg    Intake/Output: No intake/output data recorded.   Intake/Output this shift:  No intake/output data recorded.  Physical Exam: General: NAD  Head: Normocephalic, atraumatic. Moist oral mucosal membranes  Eyes: Anicteric  Lungs:  Clear to auscultation, normal effort  Heart: Regular rate and rhythm  Abdomen:  Soft, nontender  Extremities:  Trace peripheral edema.  Neurologic: Awake, alert, conversant  Skin: Warm,dry, no rash  Access: Lt permcath    Basic Metabolic Panel: Recent Labs  Lab 06/26/24 1541  NA 142  K 3.2*  CL 99  CO2 16*  GLUCOSE 252*  BUN 39*  CREATININE 15.98*  CALCIUM  7.5*  MG 2.4    Liver  Function Tests: Recent Labs  Lab 06/26/24 1541  AST 92*  ALT 99*  ALKPHOS 51  BILITOT 0.7  PROT 6.0*  ALBUMIN 3.1*   No results for input(s): LIPASE, AMYLASE in the last 168 hours. No results for input(s): AMMONIA in the last 168 hours.  CBC: Recent Labs  Lab 06/26/24 1541  WBC 15.4*  HGB 9.3*  HCT 29.4*  MCV 100.0  PLT 232    Cardiac Enzymes: No results for input(s): CKTOTAL, CKMB, CKMBINDEX, TROPONINI in the last 168 hours.  BNP: Invalid input(s): POCBNP  CBG: Recent Labs  Lab 06/26/24 1309 06/26/24 1516 06/26/24 1638 06/26/24 2228 06/27/24 0731  GLUCAP 90 233* 223* 117* 118*    Microbiology: Results for orders placed or performed during the hospital encounter of 06/26/24  MRSA Next Gen by PCR, Nasal     Status: None   Collection Time: 06/26/24  3:19 PM   Specimen: Nasal Mucosa; Nasal Swab  Result Value Ref Range Status   MRSA by PCR Next Gen NOT DETECTED NOT DETECTED Final    Comment: (NOTE) The GeneXpert MRSA Assay (FDA approved for NASAL specimens only), is one component of a comprehensive MRSA colonization surveillance program. It is not intended to diagnose MRSA infection nor to guide or monitor treatment for MRSA infections. Test performance is not FDA approved in patients less than 70 years old. Performed at [facility], [address] [place], [place] [address]     Coagulation Studies: No results for input(s): LABPROT, INR in the last 72 hours.  Urinalysis: No results for  input(s): COLORURINE, LABSPEC, PHURINE, GLUCOSEU, HGBUR, BILIRUBINUR, KETONESUR, PROTEINUR, UROBILINOGEN, NITRITE, LEUKOCYTESUR in the last 72 hours.  Invalid input(s): APPERANCEUR    Imaging: DG Chest 1 View Result Date: 06/26/2024 EXAM: 1 VIEW(S) XRAY OF THE CHEST 06/26/2024 03:54:00 PM COMPARISON: None available. CLINICAL HISTORY: AMS (altered mental status) [id number]. AMS FINDINGS: LINES, TUBES AND DEVICES:  Left internal jugular central venous catheter in place with tip at cavoatrial junction. Left subclavian and left axillary vascular stents noted. LUNGS AND PLEURA: Low lung volumes. No focal pulmonary opacity. No pulmonary edema. No pleural effusion. No pneumothorax. HEART AND MEDIASTINUM: No acute abnormality of the cardiac and mediastinal silhouettes. BONES AND SOFT TISSUES: No acute osseous abnormality. IMPRESSION: 1. No acute findings. 2. Left internal jugular central venous catheter in place with tip at cavoatrial junction. 3. Left subclavian and left axillary vascular stents noted. Electronically signed by: Dorethia Molt MD 06/26/2024 07:14 PM EDT RP Workstation: [id number]   PERIPHERAL VASCULAR CATHETERIZATION Result Date: 06/26/2024 See surgical note for result.    Medications:     cholecalciferol  5,000 Units Oral Daily   dutasteride   0.5 mg Oral Daily   insulin  aspart  0-15 Units Subcutaneous TID WC   midodrine  2.5 mg Oral Once per day on Tuesday Thursday Saturday   multivitamin  1 tablet Oral QHS   pantoprazole  40 mg Oral Daily   sodium chloride  flush  10-40 mL Intracatheter Q12H   tamsulosin   0.4 mg Oral Daily   acetaminophen  **OR** acetaminophen , alum & mag hydroxide-simeth, flumazenil, guaiFENesin-dextromethorphan, hydrALAZINE , HYDROmorphone  (DILAUDID ) injection, labetalol , metoprolol  tartrate, naLOXone (NARCAN)  injection, ondansetron  (ZOFRAN ) IV, ondansetron , mouth rinse, phenol, sodium chloride  flush  Assessment/ Plan:  Joseph Gilmore is a 53 y.o.  male with past medical history of autism, hypertension, OCD, and end stage renal disease on hemodialysis. Patient presented for scheduled permcath exchange with vascular surgery. After procedure, patient became agitated with intermittent somnolence. Required Narcan without sufficient response. He was admitted overnight for observation.   Malfunctioning dialysis access, vascular scheduled and performed permcath exchange on  10/29. Will attempt to use with dialysis today.   End stage renal disease on hemodialysis. Vascular team requested dialysis treatment to clear anesthesia and maintain outpatient schedule. Increased arterial pressure alarms, not unexpected with new catheter. Will decrease BFR and monitor.   Anemia of chronic kidney disease  Lab Results  Component Value Date   HGB 9.3 (L) 06/26/2024   Hgb acceptable, does receive Mircera at outpatient clinic.   4. 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 Most recent hemoglobin A1c is 5.2 on 06/26/24.     LOS: 0 Boe Deans 10/30/20259:45 AM

## 2024-06-28 ENCOUNTER — Telehealth (INDEPENDENT_AMBULATORY_CARE_PROVIDER_SITE_OTHER): Payer: Self-pay

## 2024-06-28 DIAGNOSIS — Z992 Dependence on renal dialysis: Secondary | ICD-10-CM | POA: Diagnosis not present

## 2024-06-28 DIAGNOSIS — N186 End stage renal disease: Secondary | ICD-10-CM | POA: Diagnosis not present

## 2024-06-28 NOTE — Telephone Encounter (Signed)
 Spoke with the patient's aunt and he is scheduled with Dr. Jama for a right arm AV graft at [facility]. Pre-admit will call to schedule pre-op  at the [facility]. Pre-surgical instructions were discussed and will  be sent to Mychart and mailed.

## 2024-06-29 DIAGNOSIS — Z992 Dependence on renal dialysis: Secondary | ICD-10-CM | POA: Diagnosis not present

## 2024-06-29 DIAGNOSIS — N2581 Secondary hyperparathyroidism of renal origin: Secondary | ICD-10-CM | POA: Diagnosis not present

## 2024-06-29 DIAGNOSIS — N186 End stage renal disease: Secondary | ICD-10-CM | POA: Diagnosis not present

## 2024-06-30 ENCOUNTER — Emergency Department

## 2024-06-30 ENCOUNTER — Inpatient Hospital Stay
Admission: EM | Admit: 2024-06-30 | Discharge: 2024-07-06 | DRG: 314 | Disposition: A | Discharge status: Home / Self Care | Attending: Internal Medicine | Admitting: Internal Medicine

## 2024-06-30 ENCOUNTER — Other Ambulatory Visit: Payer: Self-pay

## 2024-06-30 DIAGNOSIS — F429 Obsessive-compulsive disorder, unspecified: Secondary | ICD-10-CM | POA: Diagnosis present

## 2024-06-30 DIAGNOSIS — N186 End stage renal disease: Secondary | ICD-10-CM | POA: Diagnosis present

## 2024-06-30 DIAGNOSIS — T80211A Bloodstream infection due to central venous catheter, initial encounter: Secondary | ICD-10-CM | POA: Diagnosis present

## 2024-06-30 DIAGNOSIS — Z23 Encounter for immunization: Secondary | ICD-10-CM

## 2024-06-30 DIAGNOSIS — T827XXA Infection and inflammatory reaction due to other cardiac and vascular devices, implants and grafts, initial encounter: Secondary | ICD-10-CM | POA: Diagnosis not present

## 2024-06-30 DIAGNOSIS — R7989 Other specified abnormal findings of blood chemistry: Secondary | ICD-10-CM

## 2024-06-30 DIAGNOSIS — N400 Enlarged prostate without lower urinary tract symptoms: Secondary | ICD-10-CM | POA: Diagnosis present

## 2024-06-30 DIAGNOSIS — Z79899 Other long term (current) drug therapy: Secondary | ICD-10-CM | POA: Diagnosis not present

## 2024-06-30 DIAGNOSIS — Z992 Dependence on renal dialysis: Secondary | ICD-10-CM

## 2024-06-30 DIAGNOSIS — I120 Hypertensive chronic kidney disease with stage 5 chronic kidney disease or end stage renal disease: Secondary | ICD-10-CM | POA: Diagnosis present

## 2024-06-30 DIAGNOSIS — R509 Fever, unspecified: Secondary | ICD-10-CM

## 2024-06-30 DIAGNOSIS — Z888 Allergy status to other drugs, medicaments and biological substances status: Secondary | ICD-10-CM | POA: Diagnosis not present

## 2024-06-30 DIAGNOSIS — R7881 Bacteremia: Secondary | ICD-10-CM

## 2024-06-30 DIAGNOSIS — Z792 Long term (current) use of antibiotics: Secondary | ICD-10-CM | POA: Diagnosis not present

## 2024-06-30 DIAGNOSIS — Z452 Encounter for adjustment and management of vascular access device: Secondary | ICD-10-CM | POA: Diagnosis not present

## 2024-06-30 DIAGNOSIS — A419 Sepsis, unspecified organism: Secondary | ICD-10-CM | POA: Diagnosis present

## 2024-06-30 DIAGNOSIS — Z6841 Body Mass Index (BMI) 40.0 and over, adult: Secondary | ICD-10-CM

## 2024-06-30 DIAGNOSIS — R0602 Shortness of breath: Principal | ICD-10-CM

## 2024-06-30 DIAGNOSIS — Z1152 Encounter for screening for COVID-19: Secondary | ICD-10-CM

## 2024-06-30 DIAGNOSIS — F840 Autistic disorder: Secondary | ICD-10-CM | POA: Diagnosis present

## 2024-06-30 DIAGNOSIS — Y848 Other medical procedures as the cause of abnormal reaction of the patient, or of later complication, without mention of misadventure at the time of the procedure: Secondary | ICD-10-CM | POA: Diagnosis present

## 2024-06-30 DIAGNOSIS — I2489 Other forms of acute ischemic heart disease: Secondary | ICD-10-CM | POA: Diagnosis present

## 2024-06-30 DIAGNOSIS — A498 Other bacterial infections of unspecified site: Secondary | ICD-10-CM

## 2024-06-30 DIAGNOSIS — D631 Anemia in chronic kidney disease: Secondary | ICD-10-CM | POA: Diagnosis present

## 2024-06-30 DIAGNOSIS — E66813 Obesity, class 3: Secondary | ICD-10-CM | POA: Diagnosis present

## 2024-06-30 DIAGNOSIS — A4153 Sepsis due to Serratia: Secondary | ICD-10-CM | POA: Diagnosis present

## 2024-06-30 DIAGNOSIS — I10 Essential (primary) hypertension: Secondary | ICD-10-CM | POA: Diagnosis not present

## 2024-06-30 HISTORY — DX: Sepsis, unspecified organism: A41.9

## 2024-06-30 LAB — COMPREHENSIVE METABOLIC PANEL WITH GFR
ALT: 18 U/L (ref 0–44)
AST: 31 U/L (ref 15–41)
Albumin: 3.1 g/dL — ABNORMAL LOW (ref 3.5–5.0)
Alkaline Phosphatase: 59 U/L (ref 38–126)
Anion gap: 22 — ABNORMAL HIGH (ref 5–15)
BUN: 56 mg/dL — ABNORMAL HIGH (ref 6–20)
CO2: 21 mmol/L — ABNORMAL LOW (ref 22–32)
Calcium: 7.8 mg/dL — ABNORMAL LOW (ref 8.9–10.3)
Chloride: 94 mmol/L — ABNORMAL LOW (ref 98–111)
Creatinine, Ser: 15.56 mg/dL — ABNORMAL HIGH (ref 0.61–1.24)
GFR, Estimated: 3 mL/min — ABNORMAL LOW (ref >60)
Glucose, Bld: 92 mg/dL (ref 70–99)
Potassium: 4.3 mmol/L (ref 3.5–5.1)
Sodium: 137 mmol/L (ref 135–145)
Total Bilirubin: 0.6 mg/dL (ref 0.0–1.2)
Total Protein: 7.5 g/dL (ref 6.5–8.1)

## 2024-06-30 LAB — TROPONIN I (HIGH SENSITIVITY)
Troponin I (High Sensitivity): 122 ng/L (ref <18)
Troponin I (High Sensitivity): 93 ng/L — ABNORMAL HIGH (ref <18)

## 2024-06-30 LAB — CBC WITH DIFFERENTIAL/PLATELET
Abs Immature Granulocytes: 0.19 K/uL — ABNORMAL HIGH (ref 0.00–0.07)
Basophils Absolute: 0.1 K/uL (ref 0.0–0.1)
Basophils Relative: 0 %
Eosinophils Absolute: 0.1 K/uL (ref 0.0–0.5)
Eosinophils Relative: 1 %
HCT: 31.7 % — ABNORMAL LOW (ref 39.0–52.0)
Hemoglobin: 10.2 g/dL — ABNORMAL LOW (ref 13.0–17.0)
Immature Granulocytes: 2 %
Lymphocytes Relative: 8 %
Lymphs Abs: 1 K/uL (ref 0.7–4.0)
MCH: 31 pg (ref 26.0–34.0)
MCHC: 32.2 g/dL (ref 30.0–36.0)
MCV: 96.4 fL (ref 80.0–100.0)
Monocytes Absolute: 0.9 K/uL (ref 0.1–1.0)
Monocytes Relative: 7 %
Neutro Abs: 10.3 K/uL — ABNORMAL HIGH (ref 1.7–7.7)
Neutrophils Relative %: 82 %
Platelets: 150 K/uL (ref 150–400)
RBC: 3.29 MIL/uL — ABNORMAL LOW (ref 4.22–5.81)
RDW: 18.7 % — ABNORMAL HIGH (ref 11.5–15.5)
WBC: 12.6 K/uL — ABNORMAL HIGH (ref 4.0–10.5)
nRBC: 0 % (ref 0.0–0.2)

## 2024-06-30 LAB — PROTIME-INR
INR: 1.3 — ABNORMAL HIGH (ref 0.8–1.2)
Prothrombin Time: 16.9 s — ABNORMAL HIGH (ref 11.4–15.2)

## 2024-06-30 LAB — RESP PANEL BY RT-PCR (RSV, FLU A&B, COVID)  RVPGX2
Influenza A by PCR: NEGATIVE
Influenza B by PCR: NEGATIVE
Resp Syncytial Virus by PCR: NEGATIVE
SARS Coronavirus 2 by RT PCR: NEGATIVE

## 2024-06-30 LAB — LACTIC ACID, PLASMA: Lactic Acid, Venous: 1.2 mmol/L (ref 0.5–1.9)

## 2024-06-30 MED ORDER — ACETAMINOPHEN 325 MG PO TABS
650.0000 mg | ORAL_TABLET | Freq: Four times a day (QID) | ORAL | Status: DC | PRN
  Administered 2024-07-01 (×2): 650 mg via ORAL
  Filled 2024-06-30: qty 2

## 2024-06-30 MED ORDER — POLYETHYLENE GLYCOL 3350 17 G PO PACK
17.0000 g | PACK | Freq: Every day | ORAL | Status: DC | PRN
  Administered 2024-07-04 – 2024-07-05 (×2): 17 g via ORAL
  Filled 2024-06-30 (×2): qty 1

## 2024-06-30 MED ORDER — ONDANSETRON HCL 4 MG/2ML IJ SOLN: 4.0000 mg | Freq: Four times a day (QID) | INTRAMUSCULAR | Status: DC | PRN

## 2024-06-30 MED ORDER — VANCOMYCIN VARIABLE DOSE PER UNSTABLE RENAL FUNCTION (PHARMACIST DOSING): Status: DC

## 2024-06-30 MED ORDER — METRONIDAZOLE 500 MG/100ML IV SOLN
500.0000 mg | Freq: Once | INTRAVENOUS | Status: AC
  Administered 2024-06-30: 500 mg via INTRAVENOUS
  Filled 2024-06-30: qty 100

## 2024-06-30 MED ORDER — MIDODRINE HCL 5 MG PO TABS: 2.5000 mg | ORAL_TABLET | ORAL | Status: DC

## 2024-06-30 MED ORDER — VANCOMYCIN HCL 1500 MG/300ML IV SOLN
1500.0000 mg | Freq: Once | INTRAVENOUS | Status: DC
  Filled 2024-06-30: qty 300

## 2024-06-30 MED ORDER — MIDODRINE HCL 5 MG PO TABS
10.0000 mg | ORAL_TABLET | Freq: Three times a day (TID) | ORAL | Status: DC
  Administered 2024-06-30 – 2024-07-06 (×15): 10 mg via ORAL
  Filled 2024-06-30 (×16): qty 2

## 2024-06-30 MED ORDER — SODIUM CHLORIDE 0.9 % IV SOLN
2.0000 g | Freq: Once | INTRAVENOUS | Status: AC
  Administered 2024-06-30: 2 g via INTRAVENOUS
  Filled 2024-06-30: qty 12.5

## 2024-06-30 MED ORDER — SODIUM CHLORIDE 0.9 % IV SOLN
1.0000 g | INTRAVENOUS | Status: AC
  Administered 2024-07-01 – 2024-07-05 (×5): 1 g via INTRAVENOUS
  Filled 2024-06-30 (×5): qty 10

## 2024-06-30 MED ORDER — ONDANSETRON HCL 4 MG PO TABS: 4.0000 mg | ORAL_TABLET | Freq: Four times a day (QID) | ORAL | Status: DC | PRN

## 2024-06-30 MED ORDER — ASPIRIN 325 MG PO TABS
325.0000 mg | ORAL_TABLET | Freq: Once | ORAL | Status: AC
  Administered 2024-06-30: 325 mg via ORAL
  Filled 2024-06-30: qty 1

## 2024-06-30 MED ORDER — ACETAMINOPHEN 500 MG PO TABS
1000.0000 mg | ORAL_TABLET | Freq: Once | ORAL | Status: AC
  Administered 2024-06-30: 1000 mg via ORAL
  Filled 2024-06-30: qty 2

## 2024-06-30 MED ORDER — VANCOMYCIN HCL 1500 MG/300ML IV SOLN
1500.0000 mg | Freq: Once | INTRAVENOUS | Status: AC
  Administered 2024-06-30: 1500 mg via INTRAVENOUS
  Filled 2024-06-30: qty 300

## 2024-06-30 MED ORDER — VANCOMYCIN HCL IN DEXTROSE 1-5 GM/200ML-% IV SOLN
1000.0000 mg | Freq: Once | INTRAVENOUS | Status: AC
  Administered 2024-06-30: 1000 mg via INTRAVENOUS
  Filled 2024-06-30: qty 200

## 2024-06-30 MED ORDER — HEPARIN SODIUM (PORCINE) 5000 UNIT/ML IJ SOLN
5000.0000 [IU] | Freq: Three times a day (TID) | INTRAMUSCULAR | Status: DC
  Administered 2024-06-30 – 2024-07-06 (×14): 5000 [IU] via SUBCUTANEOUS
  Filled 2024-06-30 (×15): qty 1

## 2024-06-30 MED ORDER — SODIUM CHLORIDE 0.9 % IV BOLUS
500.0000 mL | Freq: Once | INTRAVENOUS | Status: AC
  Administered 2024-06-30: 500 mL via INTRAVENOUS

## 2024-06-30 MED ORDER — SODIUM CHLORIDE 0.9 % IV BOLUS
250.0000 mL | Freq: Once | INTRAVENOUS | Status: AC
  Administered 2024-06-30: 250 mL via INTRAVENOUS

## 2024-06-30 MED ORDER — INFLUENZA VIRUS VACC SPLIT PF (FLUZONE) 0.5 ML IM SUSY
0.5000 mL | PREFILLED_SYRINGE | INTRAMUSCULAR | Status: AC
  Administered 2024-07-01: 0.5 mL via INTRAMUSCULAR
  Filled 2024-06-30: qty 0.5

## 2024-06-30 MED ORDER — MIDODRINE HCL 5 MG PO TABS: 2.5000 mg | ORAL_TABLET | Freq: Three times a day (TID) | ORAL | Status: DC

## 2024-06-30 MED ORDER — ACETAMINOPHEN 650 MG RE SUPP: 650.0000 mg | Freq: Four times a day (QID) | RECTAL | Status: DC | PRN

## 2024-06-30 MED ORDER — LACTATED RINGERS IV SOLN: INTRAVENOUS | Status: AC

## 2024-06-30 MED ORDER — TAMSULOSIN HCL 0.4 MG PO CAPS
0.4000 mg | ORAL_CAPSULE | Freq: Every day | ORAL | Status: DC
Start: 2024-06-30 — End: 2024-07-06
  Administered 2024-06-30 – 2024-07-06 (×7): 0.4 mg via ORAL
  Filled 2024-06-30 (×7): qty 1

## 2024-06-30 MED ORDER — RENA-VITE PO TABS
1.0000 | ORAL_TABLET | Freq: Every day | ORAL | Status: DC
  Administered 2024-06-30 – 2024-07-05 (×6): 1 via ORAL
  Filled 2024-06-30 (×7): qty 1

## 2024-06-30 NOTE — ED Notes (Signed)
 Lab is getting bloodwork, then will move pt to room 25.

## 2024-06-30 NOTE — ED Notes (Signed)
 Advised nurse that patient has ready bed

## 2024-06-30 NOTE — Assessment & Plan Note (Signed)
 Patient denies any chest pain but having some shortness of breath.  Troponin elevated at 122>>68.  EKG with sinus tachycardia and nonspecific ST changes.  Likely secondary to demand ischemia with sepsis. -Continue to monitor

## 2024-06-30 NOTE — Assessment & Plan Note (Addendum)
 Patient met sepsis criteria with tachycardia, tachypnea, fever and leukocytosis. Initial respiratory panel negative for COVID, influenza and RSV.  Chest x-ray remained negative. Blood cultures growing Serratia marcescens -concern of catheter acquired infection Expanded RVP pending, MRSA PCR negative -Vascular to remove HD catheter after dialysis today and they will replace in 1 to 2 days -Continue with cefepime -Stop vancomycin  -Follow-up final culture results

## 2024-06-30 NOTE — Assessment & Plan Note (Signed)
 blood pressure currently borderline soft, patient was given 500 cc bolus with improvement. Not on any antihypertensives at home brother uses midodrine on the day of dialysis. - Continue to monitor

## 2024-06-30 NOTE — Progress Notes (Signed)
 CODE SEPSIS - PHARMACY COMMUNICATION  **Broad Spectrum Antibiotics should be administered within 1 hour of Sepsis diagnosis**  Time Code Sepsis Called/Page Received: 1428  Antibiotics Ordered: cefepime, metronidazole, vancomycin   Time of 1st antibiotic administration: 1510  Additional action taken by pharmacy: -  If necessary, Name of Provider/Nurse Contacted: -    Lum VEAR Mania ,PharmD Clinical Pharmacist  06/30/2024  2:29 PM

## 2024-06-30 NOTE — Hospital Course (Addendum)
 Joseph Gilmore is a 53 y.o. male with medical history significant of ESRD on TTS dialysis schedule, autistic with history of hypertension but not on any medication now came to ED with concern of cough and shortness of breath started yesterday after dialysis.   Patient had his HD catheter exchange by vascular surgery on 10/29, last dialysis was on Saturday.   On presentation patient was found to be febrile at 103.2, tachycardic at 117, tachypneic 22 and borderline soft blood pressure at 90/54, CBC with neutrophilic predominant leukocytosis at 12.6, renal function consistent with ESRD, lactic acid normal.  Troponin 122, respiratory panel negative for COVID, influenza and RSV. CXR with no acute abnormality. EKG with sinus tachycardia and nonspecific ST changes.   ED provider discussed the case with his vascular surgeon and they advised continue broad-spectrum antibiotics and obtain peripheral cultures.  HD catheter site without any obvious sign of infection.  11/3: Afebrile this morning.  Blood cultures growing Serratia marcescens.  Repeat chest x-ray this morning remain negative for any acute abnormality.  Concern of catheter acquired infection, apparently it was a difficult insertion after multiple attempts.  Case was discussed with nephrology and vascular surgery and plan is to remove catheter after dialysis today to give him a holiday.  They will reinsert another catheter in 1 to 2 days to continue with dialysis.  Vancomycin  was discontinued.  11/4: HD catheter was removed yesterday by vascular surgery, preliminary catheter tip cultures negative.  Nephrology will give 2 days of catheter holiday and then they will place another HD catheter to continue dialysis.  Susceptibility results from blood cultures are pending.  11/5: Hemodynamically stable.  Catheter tip culture with similar Serratia marcescens-pansensitive. Repeat blood cultures ordered.  Nephrology will wait for negative blood cultures  before replacing a HD catheter.  11/6: Remained hemodynamically stable, repeat blood cultures remain negative.  A new HD catheter to be placed tomorrow morning followed by dialysis.  ID is recommending 4 weeks of cefepime which can be done with dialysis. Echocardiogram normal: estimated EF is 60-65%, valves not well visualized, grossly no vegetation.  11/7: Perma catheter insertion via vascular service.  Attempted 1 session of dialysis, permacatheter did not work well.  11/8: Permacatheter functioned appropriately.  Patient completed full dialysis session.  Patient discharged.  Aunt has been updated.

## 2024-06-30 NOTE — Sepsis Progress Note (Signed)
 eLink Sepsis tracking per protocol.

## 2024-06-30 NOTE — ED Notes (Addendum)
 Initial lactic sent. Was told by triage nurse that initial lactic had been collected. Due to acuity of the unit primary nurse unable to collect initial.

## 2024-06-30 NOTE — Assessment & Plan Note (Signed)
-   Continue Flomax

## 2024-06-30 NOTE — ED Triage Notes (Signed)
 Pt presents to the ED via POV from home with mother. Pt has had SHOB, fevers, and CP since yesterday. Pt had his dialysis catheter replaced on Wednesday and was given too much sedative and ended up spending the night in the ICU.   Pt has a hx of autism, htn, kidney failure and is on dialysis.

## 2024-06-30 NOTE — H&P (Addendum)
 History and Physical    Patient: Joseph Gilmore FMW:[id number] DOB: 04-05-1971 DOA: 06/30/2024 DOS: the patient was seen and examined on 06/30/2024 PCP: Albina GORMAN Dine, MD  Patient coming from: Home  Chief Complaint:  Chief Complaint  Patient presents with   Cough   Shortness of Breath   HPI: SHAFIN POLLIO II is a 53 y.o. male with medical history significant of ESRD on TTS dialysis schedule, autistic with history of hypertension but not on any medication now came to ED with concern of cough and shortness of breath started yesterday after dialysis.  Patient had his HD catheter exchange by vascular surgery on 10/29, last dialysis was on Saturday.  Denies any chest pain or soreness around the catheter site.  Denies any upper respiratory congestion except some shortness of breath and cough.  Having some generalized aches and pain.  No nausea or vomiting but appetite poor.  No diarrhea or constipation.  Denies any urinary symptoms.  Denies any fever or chills at home.  ED course and data reviewed.  On presentation patient was found to be febrile at 103.2, tachycardic at 117, tachypneic 22 and borderline soft blood pressure at 90/54, CBC with neutrophilic predominant leukocytosis at 12.6, renal function consistent with ESRD, lactic acid normal.  Troponin 122, respiratory panel negative for COVID, influenza and RSV. CXR with no acute abnormality. EKG with sinus tachycardia and nonspecific ST changes.  ED provider discussed the case with his vascular surgeon and they advised continue broad-spectrum antibiotics and obtain peripheral cultures.  HD catheter site without any concern of infection.  Patient was started on broad-spectrum antibiotic and [facility] was consulted for admission.  Review of Systems: As mentioned in the history of present illness. All other systems reviewed and are negative. Past Medical History:  Diagnosis Date   Autism    Chronic kidney disease    ESRD (end stage renal  disease) (HCC)    Hypertension    Murmur    OCD (obsessive compulsive disorder)    Past Surgical History:  Procedure Laterality Date   A/V FISTULAGRAM Left 10/07/2020   Procedure: A/V FISTULAGRAM;  Surgeon: Jama Cordella [name], MD;  Location: [facility];  Service: Cardiovascular;  Laterality: Left;   A/V FISTULAGRAM Left 02/15/2023   Procedure: A/V Fistulagram;  Surgeon: Jama Cordella [name], MD;  Location: [facility];  Service: Cardiovascular;  Laterality: Left;   A/V FISTULAGRAM Left 11/28/2023   Procedure: A/V Fistulagram;  Surgeon: Jama Cordella [name], MD;  Location: [facility];  Service: Cardiovascular;  Laterality: Left;   A/V FISTULAGRAM Left 12/26/2023   Procedure: A/V Fistulagram;  Surgeon: Jama Cordella [name], MD;  Location: [facility];  Service: Cardiovascular;  Laterality: Left;   AV FISTULA PLACEMENT Left 07/15/2020   Procedure: ARTERIOVENOUS FISTULA CREATION;  Surgeon: Jama Cordella [name], MD;  Location: [facility];  Service: Vascular;  Laterality: Left;   AV FISTULA PLACEMENT Left 10/04/2023   Procedure: INSERTION OF ARTERIOVENOUS (AV) GORE-TEX GRAFT ARM (BRACHIAL AXILLARY);  Surgeon: Jama Cordella [name], MD;  Location: [facility];  Service: Vascular;  Laterality: Left;   DIALYSIS/PERMA CATHETER INSERTION N/A 04/06/2020   Procedure: DIALYSIS/PERMA CATHETER INSERTION;  Surgeon: Marea Selinda GORMAN, MD;  Location: [facility];  Service: Cardiovascular;  Laterality: N/A;   DIALYSIS/PERMA CATHETER INSERTION N/A 05/26/2020   Procedure: DIALYSIS/PERMA CATHETER INSERTION;  Surgeon: Jama Cordella [name], MD;  Location: [facility];  Service: Cardiovascular;  Laterality: N/A;   DIALYSIS/PERMA CATHETER  INSERTION N/A 07/09/2020   Procedure: DIALYSIS/PERMA CATHETER INSERTION;  Surgeon: Marea Selinda RAMAN, MD;  Location: [facility];  Service: Cardiovascular;  Laterality: N/A;   DIALYSIS/PERMA CATHETER INSERTION N/A 01/08/2021   Procedure: DIALYSIS/PERMA  CATHETER INSERTION;  Surgeon: Marea Selinda RAMAN, MD;  Location: [facility];  Service: Cardiovascular;  Laterality: N/A;   DIALYSIS/PERMA CATHETER INSERTION N/A 03/17/2023   Procedure: DIALYSIS/PERMA CATHETER INSERTION;  Surgeon: Jama Cordella [name], MD;  Location: [facility];  Service: Cardiovascular;  Laterality: N/A;   DIALYSIS/PERMA CATHETER REMOVAL N/A 03/15/2021   Procedure: DIALYSIS/PERMA CATHETER REMOVAL;  Surgeon: Marea Selinda RAMAN, MD;  Location: [facility];  Service: Cardiovascular;  Laterality: N/A;   REVISON OF ARTERIOVENOUS FISTULA Left 12/11/2020   Procedure: ARTERIOVENOUS (AV) FISTULA CREATION ( BRACHIAL CEPHALIC );  Surgeon: Jama Cordella [name], MD;  Location: [facility];  Service: Vascular;  Laterality: Left;   TEMPORARY DIALYSIS CATHETER N/A 03/15/2023   Procedure: TEMPORARY DIALYSIS CATHETER;  Surgeon: Jama Cordella [name], MD;  Location: [facility];  Service: Cardiovascular;  Laterality: N/A;   Social History:  reports that he has never smoked. He has never been exposed to tobacco smoke. He has never used smokeless tobacco. He reports that he does not drink alcohol and does not use drugs.  Allergies  Allergen Reactions   Chlorhexidine  Other (See Comments)    Blisters (topical)    History reviewed. No pertinent family history.  Prior to Admission medications   Medication Sig Start Date End Date Taking? Authorizing Provider  calcium  acetate (PHOSLO ) 667 MG capsule Take 667 mg by mouth 3 (three) times daily with meals. Patient not taking: No sig reported 11/03/23 11/01/24  [provider]  Cholecalciferol (VITAMIN D -3) 125 MCG (5000 UT) TABS Take 5,000 Units by mouth daily.    [provider]  dutasteride  (AVODART ) 0.5 MG capsule TAKE ONE CAPSULE BY MOUTH DAILY 07/24/23   Stoioff, Glendia BROCKS, MD  HYDROcodone -acetaminophen  (NORCO) 5-325 MG tablet Take 1-2 tablets by mouth every 6 (six) hours as needed for moderate pain (pain score 4-6) or  severe pain (pain score 7-10). Patient not taking: No sig reported 10/04/23   Schnier, Cordella [name], MD  Methoxy PEG-Epoetin  Beta (MIRCERA IJ) Mircera Patient not taking: Reported on 06/26/2024 08/19/23 08/17/24  [provider]  midodrine (PROAMATINE) 2.5 MG tablet Take 2.5 mg by mouth See admin instructions. Take 2.5 mg by mouth on Tuesday, Thursday and Saturday at dialysis as needed for low blood pressure.    [provider]  multivitamin (RENA-VIT) TABS tablet Take by mouth. 06/12/20   [provider]  tamsulosin  (FLOMAX ) 0.4 MG CAPS capsule TAKE ONE CAPSULE BY MOUTH DAILY 07/24/23   Twylla Glendia BROCKS, MD    Physical Exam: Vitals:   06/30/24 1258 06/30/24 1301 06/30/24 1545 06/30/24 1702  BP: (!) 181/116  (!) 90/54   Pulse: (!) 117  100   Resp: (!) 22  19   Temp: (!) 103.2 F (39.6 C)   98.7 F (37.1 C)  TempSrc: Oral   Oral  SpO2: 99%  99%   Weight:  120 kg    Height:  5' 8 (1.727 m)      General: Vital signs reviewed.  Obese gentleman, in no acute distress and cooperative with exam.  Head: Normocephalic and atraumatic. Eyes: EOMI, conjunctivae normal, no scleral icterus.  Neck: Supple, trachea midline, normal ROM,  Cardiovascular: Sinus tachycardia Pulmonary/Chest: Clear to auscultation bilaterally, no wheezes, rales, or rhonchi.  Abdominal: Soft, non-tender, non-distended, BS +,  Extremities: No lower extremity edema bilaterally,  pulses symmetric and intact bilaterally. No cyanosis or clubbing. Neurological: A&O x3, Strength is normal and symmetric bilaterally, cranial nerve II-XII are grossly intact, no focal motor deficit, sensory intact to light touch bilaterally.   Data Reviewed: Prior data reviewed as mentioned above  Assessment and Plan: * Sepsis [facility]) Patient met sepsis criteria with tachycardia, tachypnea, fever and leukocytosis.  Likely upper respiratory source. Admit to medical telemetry. Blood and urine cultures ordered Initial  respiratory panel negative for COVID, influenza and RSV.  Portable chest x-ray negative. Will repeat chest x-ray-two-view tomorrow Expanded respiratory viral panel ordered. -Sepsis protocol fluids were not done as patient is ESRD -Ordered some gentle IV fluid - Continuing broad-spectrum antibiotics -Follow-up cultures  ESRD on dialysis [facility]) Patient is on TTS schedule, last dialysis was yesterday. - Nephrology consult  Elevated troponin Patient denies any chest pain but having some shortness of breath.  Troponin elevated at 122.  EKG with sinus tachycardia and nonspecific ST changes. - Continue to trend  Hypertension  blood pressure currently borderline soft, patient was given 500 cc bolus with improvement. Not on any antihypertensives at home brother uses midodrine on the day of dialysis. - Continue to monitor  BPH (benign prostatic hyperplasia) - Continue Flomax   Obesity, Class III, BMI 40-49.9 (morbid obesity) (HCC) Estimated body mass index is 40.22 kg/m as calculated from the following:   Height as of this encounter: 5' 8 (1.727 m).   Weight as of this encounter: 120 kg.   - This will complicate overall prognosis -Encouraged weight loss   Advance Care Planning:   Code Status: Full Code   Consults: Nephrology  Family Communication: Discussed with daughter at bedside  Severity of Illness: The appropriate patient status for this patient is INPATIENT. Inpatient status is judged to be reasonable and necessary in order to provide the required intensity of service to ensure the patient's safety. The patient's presenting symptoms, physical exam findings, and initial radiographic and laboratory data in the context of their chronic comorbidities is felt to place them at high risk for further clinical deterioration. Furthermore, it is not anticipated that the patient will be medically stable for discharge from the hospital within 2 midnights of admission.   * I certify that at  the point of admission it is my clinical judgment that the patient will require inpatient hospital care spanning beyond 2 midnights from the point of admission due to high intensity of service, high risk for further deterioration and high frequency of surveillance required.*  This record has been created using Conservation officer, historic buildings. Errors have been sought and corrected,but may not always be located. Such creation errors do not reflect on the standard of care.   Author: Amaryllis Dare, MD 06/30/2024 5:16 PM  For on call review [email or web address].

## 2024-06-30 NOTE — ED Provider Notes (Signed)
 [facility] Provider Note    Event Date/Time   First MD Initiated Contact with Patient 06/30/24 1345     (approximate)   History   Cough and Shortness of Breath   HPI  Joseph Gilmore is a 53 year old male with history of hypertension, ESRD on HD presenting to the emergency department for evaluation of shortness of breath.  Patient underwent exchange of his left chest wall PermCath with Dr. Marea on 10/29.  Family reports he had excessive sedation so he stayed overnight and was discharged on the 30th.  Yesterday, patient had onset of shortness of breath, chest pain, fevers.  Has completed recent dialysis session without issue.  No abdominal pain, nausea, vomiting, diarrhea.  Patient does make urine, denies urinary symptoms.     Physical Exam   Triage Vital Signs: ED Triage Vitals  Encounter Vitals Group     BP 06/30/24 1258 (!) 181/116     Girls Systolic BP Percentile --      Girls Diastolic BP Percentile --      Boys Systolic BP Percentile --      Boys Diastolic BP Percentile --      Pulse Rate 06/30/24 1258 (!) 117     Resp 06/30/24 1258 (!) 22     Temp 06/30/24 1258 (!) 103.2 F (39.6 C)     Temp Source 06/30/24 1258 Oral     SpO2 06/30/24 1258 99 %     Weight 06/30/24 1301 264 lb 8.8 oz (120 kg)     Height 06/30/24 1301 5' 8 (1.727 m)     Head Circumference --      Peak Flow --      Pain Score --      Pain Loc --      Pain Education --      Exclude from Growth Chart --     Most recent vital signs: Vitals:   06/30/24 1258  BP: (!) 181/116  Pulse: (!) 117  Resp: (!) 22  Temp: (!) 103.2 F (39.6 C)  SpO2: 99%     General: Awake, interactive  CV:  Good peripheral perfusion Resp:  Mildly labored respirations, lungs overall clear to auscultation Chest wall: Left PermCath in place with overlying dressing.  There is no significant drainage over the dressing or appreciable surrounding erythema.  Chest wall without significant tenderness  to palpation. Abd:  Nondistended, soft, nontender Neuro:  Symmetric facial movement, fluid speech   ED Results / Procedures / Treatments   Labs (all labs ordered are listed, but only abnormal results are displayed) Labs Reviewed  COMPREHENSIVE METABOLIC PANEL WITH GFR - Abnormal; Notable for the following components:      Result Value   Chloride 94 (*)    CO2 21 (*)    BUN 56 (*)    Creatinine, Ser 15.56 (*)    Calcium  7.8 (*)    Albumin 3.1 (*)    GFR, Estimated 3 (*)    Anion gap 22 (*)    All other components within normal limits  CBC WITH DIFFERENTIAL/PLATELET - Abnormal; Notable for the following components:   WBC 12.6 (*)    RBC 3.29 (*)    Hemoglobin 10.2 (*)    HCT 31.7 (*)    RDW 18.7 (*)    Neutro Abs 10.3 (*)    Abs Immature Granulocytes 0.19 (*)    All other components within normal limits  PROTIME-INR - Abnormal; Notable for the following components:  Prothrombin Time 16.9 (*)    INR 1.3 (*)    All other components within normal limits  TROPONIN I (HIGH SENSITIVITY) - Abnormal; Notable for the following components:   Troponin I (High Sensitivity) 122 (*)    All other components within normal limits  CULTURE, BLOOD (ROUTINE X 2)  CULTURE, BLOOD (ROUTINE X 2)  RESP PANEL BY RT-PCR (RSV, FLU A&B, COVID)  [id number]  LACTIC ACID, PLASMA  LACTIC ACID, PLASMA  URINALYSIS, W/ REFLEX TO CULTURE (INFECTION SUSPECTED)     EKG EKG independently reviewed and interpreted by myself demonstrates:  EKG demonstrate sinus tachycardia at a rate of 120, PR 124, QRS 80, QTc 443, no acute ST changes  RADIOLOGY Imaging independently reviewed and interpreted by myself demonstrates:  CXR without focal consolidation  Formal Radiology Read:  [facility] Chest Port 1 View Result Date: 06/30/2024 CLINICAL DATA:  Short of breath, fever, chest pain since yesterday EXAM: PORTABLE CHEST 1 VIEW COMPARISON:  06/26/2024 FINDINGS: Single frontal view of the chest demonstrates stable left internal  jugular dialysis catheter. The cardiac silhouette remains enlarged. No acute airspace disease, effusion, or pneumothorax. No acute bony abnormalities. IMPRESSION: 1. Stable chest, no acute process. Electronically Signed   By: Ozell Daring M.D.   On: 06/30/2024 13:53    PROCEDURES:  Critical Care performed: Yes, see critical care procedure note(s)  CRITICAL CARE Performed by: Nilsa Dade   Total critical care time: 32 minutes  Critical care time was exclusive of separately billable procedures and treating other patients.  Critical care was necessary to treat or prevent imminent or life-threatening deterioration.  Critical care was time spent personally by me on the following activities: development of treatment plan with patient and/or surrogate as well as nursing, discussions with consultants, evaluation of patient's response to treatment, examination of patient, obtaining history from patient or surrogate, ordering and performing treatments and interventions, ordering and review of laboratory studies, ordering and review of radiographic studies, pulse oximetry and re-evaluation of patient's condition.   Procedures   MEDICATIONS ORDERED IN ED: Medications  ceFEPIme (MAXIPIME) 2 g in sodium chloride  0.9 % 100 mL IVPB (2 g Intravenous New Bag/Given 06/30/24 1510)  metroNIDAZOLE (FLAGYL) IVPB 500 mg (has no administration in time range)  vancomycin  (VANCOCIN ) IVPB 1000 mg/200 mL premix (has no administration in time range)  acetaminophen  (TYLENOL ) tablet 1,000 mg (1,000 mg Oral Given 06/30/24 1310)  aspirin tablet 325 mg (325 mg Oral Given 06/30/24 1510)     IMPRESSION / MDM / ASSESSMENT AND PLAN / ED COURSE  I reviewed the triage vital signs and the nursing notes.  Differential diagnosis includes, but is not limited to, pneumonia, viral illness, bacteremia in the setting of indwelling catheter, UTI, other infectious source, very low suspicion anesthesia reaction several days out from  procedure  Patient's presentation is most consistent with acute presentation with potential threat to life or bodily function.  53 year old male presenting to the emergency department for evaluation of shortness of breath found to have fever with temp of 103.2, tachycardia on presentation.  Labs with leukocytosis WBC of 12.6, stable anemia.CMP with elevated BUN and creatinine consistent with known history of ESRD.  Anion gap acidosis noted without significantly elevated glucose, question possible lactic acidosis, lactate pending.  Troponin elevated at 122, possibly related to strain versus poor clearance in the setting of ESRD.  EKG without acute ischemic findings.  Ordered for aspirin, will defer on heparin  given atypical presentation for ACS.  X-Gabrille Kilbride without obvious pneumonia.  In the setting of recent catheter placement, case was discussed with Dr. Elnor with vascular surgery team.  He did recommend peripheral blood cultures, did not feel that a blood culture need to be drawn directly from patient's catheter site.  Recommended maintaining the catheter in place for now while patient is on broad-spectrum antibiotics.  Sepsis orders were initiated with cefepime, Flagyl, vancomycin .  Patient has a history of ESRD and without evidence of septic shock.  In setting of this I have not ordered IV fluids.  Do think patient is appropriate for admission for further evaluation.  Reach out to hospitalist team.  Clinical Course as of 06/30/24 1537  Sun Jun 30, 2024  1536 Case reviewed with hospitalist team.  They will evaluate for anticipated admission. [NR]    Clinical Course User Index [NR] Levander Slate, MD     FINAL CLINICAL IMPRESSION(S) / ED DIAGNOSES   Final diagnoses:  Shortness of breath  Fever, unspecified fever cause     Rx / DC Orders   ED Discharge Orders     None        Note:  This document was prepared using Dragon voice recognition software and may include unintentional dictation  errors.   Levander Slate, MD 06/30/24 [phone]

## 2024-06-30 NOTE — Assessment & Plan Note (Signed)
 Patient is on TTS schedule, last dialysis was yesterday. - Nephrology consult

## 2024-06-30 NOTE — Assessment & Plan Note (Signed)
 Estimated body mass index is 40.22 kg/m as calculated from the following:   Height as of this encounter: 5' 8 (1.727 m).   Weight as of this encounter: 120 kg.   - This will complicate overall prognosis -Encouraged weight loss

## 2024-06-30 NOTE — Progress Notes (Signed)
 Pharmacy Antibiotic Note  Joseph Gilmore is a 53 y.o. male admitted on 06/30/2024 with sepsis.  Pharmacy has been consulted for vancomycin  and cefepime dosing.  Plan: Patient already given vancomycin  1000 mg IV x 1 Give vancomycin  1500 mg IV x1 to complete loading dose of 2500 mg Will give vancomycin  1000 mg IV QHD. Will wait to schedule vancomycin  doses until after nephrology decides inpatient dialysis plans Goal trough 15-25 mcg/mL Plan to check vancomycin  level prior to 4th maintenance dose Start cefepime 1 g IV Q24H Monitor culture results   Height: 5' 8 (172.7 cm) Weight: 120 kg (264 lb 8.8 oz) IBW/kg (Calculated) : 68.4  Temp (24hrs), Avg:101 F (38.3 C), Min:98.7 F (37.1 C), Max:103.2 F (39.6 C)  Recent Labs  Lab 06/26/24 1541 06/30/24 1337 06/30/24 1539  WBC 15.4* 12.6*  --   CREATININE 15.98* 15.56*  --   LATICACIDVEN  --   --  1.2    Estimated Creatinine Clearance: 7 mL/min (A) (by C-G formula based on SCr of 15.56 mg/dL (H)).    Allergies  Allergen Reactions   Chlorhexidine  Other (See Comments)    Blisters (topical)    Antimicrobials this admission: 11/2 Vanc >>  11/2 Cefepime >>  11/2 Metronidazole x 1  Microbiology results: 11/2 BCx: in process  Thank you for allowing pharmacy to be a part of this patient's care.  Lum VEAR Mania, PharmD, BCPS 06/30/2024 5:14 PM

## 2024-07-01 ENCOUNTER — Encounter
Admission: EM | Disposition: A | Discharge status: Home / Self Care | Payer: Self-pay | Source: Home / Self Care | Attending: Internal Medicine

## 2024-07-01 ENCOUNTER — Inpatient Hospital Stay

## 2024-07-01 ENCOUNTER — Encounter: Payer: Self-pay | Admitting: Vascular Surgery

## 2024-07-01 DIAGNOSIS — A419 Sepsis, unspecified organism: Secondary | ICD-10-CM | POA: Diagnosis not present

## 2024-07-01 DIAGNOSIS — R7989 Other specified abnormal findings of blood chemistry: Secondary | ICD-10-CM | POA: Diagnosis not present

## 2024-07-01 DIAGNOSIS — I10 Essential (primary) hypertension: Secondary | ICD-10-CM | POA: Diagnosis not present

## 2024-07-01 DIAGNOSIS — Z792 Long term (current) use of antibiotics: Secondary | ICD-10-CM

## 2024-07-01 DIAGNOSIS — Z79899 Other long term (current) drug therapy: Secondary | ICD-10-CM

## 2024-07-01 DIAGNOSIS — R0602 Shortness of breath: Secondary | ICD-10-CM | POA: Diagnosis not present

## 2024-07-01 DIAGNOSIS — Z452 Encounter for adjustment and management of vascular access device: Secondary | ICD-10-CM

## 2024-07-01 DIAGNOSIS — T827XXA Infection and inflammatory reaction due to other cardiac and vascular devices, implants and grafts, initial encounter: Secondary | ICD-10-CM

## 2024-07-01 DIAGNOSIS — A4153 Sepsis due to Serratia: Secondary | ICD-10-CM | POA: Diagnosis not present

## 2024-07-01 DIAGNOSIS — N186 End stage renal disease: Secondary | ICD-10-CM | POA: Diagnosis not present

## 2024-07-01 HISTORY — PX: DIALYSIS/PERMA CATHETER REMOVAL: CATH118289

## 2024-07-01 LAB — BLOOD CULTURE ID PANEL (REFLEXED) - BCID2

## 2024-07-01 LAB — CBC
HCT: 24.8 % — ABNORMAL LOW (ref 39.0–52.0)
Hemoglobin: 8.3 g/dL — ABNORMAL LOW (ref 13.0–17.0)
MCH: 31.3 pg (ref 26.0–34.0)
MCHC: 33.5 g/dL (ref 30.0–36.0)
MCV: 93.6 fL (ref 80.0–100.0)
Platelets: 134 K/uL — ABNORMAL LOW (ref 150–400)
RBC: 2.65 MIL/uL — ABNORMAL LOW (ref 4.22–5.81)
RDW: 18.6 % — ABNORMAL HIGH (ref 11.5–15.5)
WBC: 9.8 K/uL (ref 4.0–10.5)
nRBC: 0 % (ref 0.0–0.2)

## 2024-07-01 LAB — RESPIRATORY PANEL BY PCR

## 2024-07-01 LAB — BASIC METABOLIC PANEL WITH GFR
Anion gap: 16 — ABNORMAL HIGH (ref 5–15)
BUN: 69 mg/dL — ABNORMAL HIGH (ref 6–20)
CO2: 20 mmol/L — ABNORMAL LOW (ref 22–32)
Calcium: 6.6 mg/dL — ABNORMAL LOW (ref 8.9–10.3)
Chloride: 96 mmol/L — ABNORMAL LOW (ref 98–111)
Creatinine, Ser: 17.48 mg/dL — ABNORMAL HIGH (ref 0.61–1.24)
GFR, Estimated: 3 mL/min — ABNORMAL LOW (ref >60)
Glucose, Bld: 81 mg/dL (ref 70–99)
Potassium: 3.8 mmol/L (ref 3.5–5.1)
Sodium: 132 mmol/L — ABNORMAL LOW (ref 135–145)

## 2024-07-01 LAB — TROPONIN I (HIGH SENSITIVITY): Troponin I (High Sensitivity): 68 ng/L — ABNORMAL HIGH (ref <18)

## 2024-07-01 LAB — MRSA NEXT GEN BY PCR, NASAL: MRSA by PCR Next Gen: NOT DETECTED

## 2024-07-01 SURGERY — DIALYSIS/PERMA CATHETER REMOVAL
Anesthesia: LOCAL

## 2024-07-01 MED ORDER — EPOETIN ALFA-EPBX 10000 UNIT/ML IJ SOLN
INTRAMUSCULAR | Status: AC
  Filled 2024-07-01: qty 1

## 2024-07-01 MED ORDER — EPOETIN ALFA-EPBX 10000 UNIT/ML IJ SOLN: 10000.0000 [IU] | Freq: Once | INTRAMUSCULAR | Status: DC

## 2024-07-01 SURGICAL SUPPLY — 3 items
DURAPREP 26ML APPLICATOR (WOUND CARE) IMPLANT
FORCEPS FG STRG 5.5XNS LF DISP (INSTRUMENTS) IMPLANT
TRAY LACERAT/PLASTIC (MISCELLANEOUS) IMPLANT

## 2024-07-01 NOTE — Progress Notes (Addendum)
 [facility]  ROUNDING NOTE   Subjective:   Joseph Gilmore is a 53 y.o. male with past medical history of autism, hypertension, OCD, and end stage renal disease on hemodialysis. Patient presents to ED with shortness of breath and has been admitted for Shortness of breath [R06.02] Sepsis (HCC) [A41.9] Fever, unspecified fever cause [R50.9]  Patient is known to our practice and receives outpatient dialysis treatments at [facility] on a TTS schedule, supervised by Dr Douglas.  Last treatment received on Saturday.  Patient is a poor historian therefore chart review is used to determine history.  Appears patient was experiencing minor aches and pains.  Has shortness of breath and cough, denies congestion.  No known fever or chills.  Labs on ED arrival concerning for BUN 56, creatinine 15.56 with GFR 3, troponin 122, white count 12.6 with hemoglobin 10.2.  Respiratory panel negative for influenza, COVID-19, and RSV.  1 blood culture positive for Serratia marcescens.  No acute findings on chest x-ray.  We have been consulted to maintain dialysis needs during this admission.  Objective:  Vital signs in last 24 hours:  Temp:  [98.2 F (36.8 C)-102.6 F (39.2 C)] 99.2 F (37.3 C) (11/03 1320) Pulse Rate:  [76-100] 94 (11/03 1500) Resp:  [16-30] 22 (11/03 1500) BP: (76-151)/(54-129) 151/129 (11/03 1500) SpO2:  [94 %-100 %] 100 % (11/03 1500) Weight:  [122.9 kg] 122.9 kg (11/03 1320)  Weight change:  Filed Weights   06/30/24 1301 07/01/24 1320  Weight: 120 kg 122.9 kg    Intake/Output: I/O last 3 completed shifts: In: 290.7 [I.V.:290.7] Out: -    Intake/Output this shift:  Total I/O In: 240 [P.O.:240] Out: -   Physical Exam: General: NAD  Head: Normocephalic, atraumatic. Moist oral mucosal membranes  Eyes: Anicteric  Lungs:  Mild basilar crackles, normal effort  Heart: Regular rate and rhythm  Abdomen:  Soft, nontender  Extremities:  Trace peripheral edema.   Neurologic: Awake, alert, conversant  Skin: Warm,dry, no rash  Access: Lt permcath    Basic Metabolic Panel: Recent Labs  Lab 06/26/24 1541 06/30/24 1337 07/01/24 0419  NA 142 137 132*  K 3.2* 4.3 3.8  CL 99 94* 96*  CO2 16* 21* 20*  GLUCOSE 252* 92 81  BUN 39* 56* 69*  CREATININE 15.98* 15.56* 17.48*  CALCIUM  7.5* 7.8* 6.6*  MG 2.4  --   --     Liver Function Tests: Recent Labs  Lab 06/26/24 1541 06/30/24 1337  AST 92* 31  ALT 99* 18  ALKPHOS 51 59  BILITOT 0.7 0.6  PROT 6.0* 7.5  ALBUMIN 3.1* 3.1*   No results for input(s): LIPASE, AMYLASE in the last 168 hours. No results for input(s): AMMONIA in the last 168 hours.  CBC: Recent Labs  Lab 06/26/24 1541 06/30/24 1337 07/01/24 0419  WBC 15.4* 12.6* 9.8  NEUTROABS  --  10.3*  --   HGB 9.3* 10.2* 8.3*  HCT 29.4* 31.7* 24.8*  MCV 100.0 96.4 93.6  PLT 232 150 134*    Cardiac Enzymes: No results for input(s): CKTOTAL, CKMB, CKMBINDEX, TROPONINI in the last 168 hours.  BNP: Invalid input(s): POCBNP  CBG: Recent Labs  Lab 06/26/24 1516 06/26/24 1638 06/26/24 2228 06/27/24 0731 06/27/24 1305  GLUCAP 233* 223* 117* 118* 108*    Microbiology: Results for orders placed or performed during the hospital encounter of 06/30/24  Culture, blood (Routine x 2)     Status: None (Preliminary result)   Collection Time:  06/30/24  1:37 PM   Specimen: BLOOD  Result Value Ref Range Status   Specimen Description BLOOD RIGHT ANTECUBITAL  Final   Special Requests   Final    BOTTLES DRAWN AEROBIC AND ANAEROBIC Blood Culture adequate volume   Culture  Setup Time   Final    GRAM NEGATIVE RODS AEROBIC BOTTLE ONLY Organism ID to follow CRITICAL RESULT CALLED TO, READ BACK BY AND VERIFIED WITH: MADISON HUNT PHARM.D 06/30/24 1004 KG    Culture   Final    NO GROWTH < 24 HOURS Performed at [facility], [address] [place], [place] [address]    Report Status PENDING  Incomplete  Blood  Culture ID Panel (Reflexed)     Status: Abnormal   Collection Time: 06/30/24  1:37 PM  Result Value Ref Range Status   Enterococcus faecalis NOT DETECTED NOT DETECTED Final   Enterococcus Faecium NOT DETECTED NOT DETECTED Final   Listeria monocytogenes NOT DETECTED NOT DETECTED Final   Staphylococcus species NOT DETECTED NOT DETECTED Final   Staphylococcus aureus (BCID) NOT DETECTED NOT DETECTED Final   Staphylococcus epidermidis NOT DETECTED NOT DETECTED Final   Staphylococcus lugdunensis NOT DETECTED NOT DETECTED Final   Streptococcus species NOT DETECTED NOT DETECTED Final   Streptococcus agalactiae NOT DETECTED NOT DETECTED Final   Streptococcus pneumoniae NOT DETECTED NOT DETECTED Final   Streptococcus pyogenes NOT DETECTED NOT DETECTED Final   A.calcoaceticus-baumannii NOT DETECTED NOT DETECTED Final   Bacteroides fragilis NOT DETECTED NOT DETECTED Final   Enterobacterales DETECTED (A) NOT DETECTED Final    Comment: Enterobacterales represent a large order of gram negative bacteria, not a single organism. CRITICAL RESULT CALLED TO, READ BACK BY AND VERIFIED WITH: MADISON HUNT PHARM.D 07/01/24 1004 KG    Enterobacter cloacae complex NOT DETECTED NOT DETECTED Final   Escherichia coli NOT DETECTED NOT DETECTED Final   Klebsiella aerogenes NOT DETECTED NOT DETECTED Final   Klebsiella oxytoca NOT DETECTED NOT DETECTED Final   Klebsiella pneumoniae NOT DETECTED NOT DETECTED Final   Proteus species NOT DETECTED NOT DETECTED Final   Salmonella species NOT DETECTED NOT DETECTED Final   Serratia marcescens DETECTED (A) NOT DETECTED Final    Comment: CRITICAL RESULT CALLED TO, READ BACK BY AND VERIFIED WITH: MADISON HUNT PHARM.D 07/01/24 1004 KG    Haemophilus influenzae NOT DETECTED NOT DETECTED Final   Neisseria meningitidis NOT DETECTED NOT DETECTED Final   Pseudomonas aeruginosa NOT DETECTED NOT DETECTED Final   Stenotrophomonas maltophilia NOT DETECTED NOT DETECTED Final   Candida  albicans NOT DETECTED NOT DETECTED Final   Candida auris NOT DETECTED NOT DETECTED Final   Candida glabrata NOT DETECTED NOT DETECTED Final   Candida krusei NOT DETECTED NOT DETECTED Final   Candida parapsilosis NOT DETECTED NOT DETECTED Final   Candida tropicalis NOT DETECTED NOT DETECTED Final   Cryptococcus neoformans/gattii NOT DETECTED NOT DETECTED Final   CTX-M ESBL NOT DETECTED NOT DETECTED Final   Carbapenem resistance IMP NOT DETECTED NOT DETECTED Final   Carbapenem resistance KPC NOT DETECTED NOT DETECTED Final   Carbapenem resistance NDM NOT DETECTED NOT DETECTED Final   Carbapenem resist OXA 48 LIKE NOT DETECTED NOT DETECTED Final   Carbapenem resistance VIM NOT DETECTED NOT DETECTED Final    Comment: Performed at [facility], [address] [place], [place] [address]  Culture, blood (Routine x 2)     Status: None (Preliminary result)   Collection Time: 06/30/24  2:59 PM   Specimen: BLOOD  Result  Value Ref Range Status   Specimen Description BLOOD BLOOD RIGHT FOREARM  Final   Special Requests   Final    BOTTLES DRAWN AEROBIC AND ANAEROBIC Blood Culture adequate volume   Culture   Final    NO GROWTH < 24 HOURS Performed at [facility], [address], [place], [place] [address]    Report Status PENDING  Incomplete  Resp panel by RT-PCR (RSV, Flu A&B, Covid) Anterior Nasal Swab     Status: None   Collection Time: 06/30/24  3:39 PM   Specimen: Anterior Nasal Swab  Result Value Ref Range Status   SARS Coronavirus 2 by RT PCR NEGATIVE NEGATIVE Final    Comment: (NOTE) SARS-CoV-2 target nucleic acids are NOT DETECTED.  The SARS-CoV-2 RNA is generally detectable in upper respiratory specimens during the acute phase of infection. The lowest concentration of SARS-CoV-2 viral copies this assay can detect is 138 copies/mL. A negative result does not preclude SARS-Cov-2 infection and should not be used as the sole basis for treatment or other patient  management decisions. A negative result may occur with  improper specimen collection/handling, submission of specimen other than nasopharyngeal swab, presence of viral mutation(s) within the areas targeted by this assay, and inadequate number of viral copies(<138 copies/mL). A negative result must be combined with clinical observations, patient history, and epidemiological information. The expected result is Negative.  Fact Sheet for Patients:  bloggercourse.com  Fact Sheet for Healthcare Providers:  seriousbroker.it  This test is no t yet approved or cleared by the [place]  FDA and  has been authorized for detection and/or diagnosis of SARS-CoV-2 by FDA under an Emergency Use Authorization (EUA). This EUA will remain  in effect (meaning this test can be used) for the duration of the COVID-19 declaration under Section 564(b)(1) of the Act, 21 U.S.C.section 360bbb-3(b)(1), unless the authorization is terminated  or revoked sooner.       Influenza A by PCR NEGATIVE NEGATIVE Final   Influenza B by PCR NEGATIVE NEGATIVE Final    Comment: (NOTE) The Xpert Xpress SARS-CoV-2/FLU/RSV plus assay is intended as an aid in the diagnosis of influenza from Nasopharyngeal swab specimens and should not be used as a sole basis for treatment. Nasal washings and aspirates are unacceptable for Xpert Xpress SARS-CoV-2/FLU/RSV testing.  Fact Sheet for Patients: bloggercourse.com  Fact Sheet for Healthcare Providers: seriousbroker.it  This test is not yet approved or cleared by the [place]  FDA and has been authorized for detection and/or diagnosis of SARS-CoV-2 by FDA under an Emergency Use Authorization (EUA). This EUA will remain in effect (meaning this test can be used) for the duration of the COVID-19 declaration under Section 564(b)(1) of the Act, 21 U.S.C. section 360bbb-3(b)(1),  unless the authorization is terminated or revoked.     Resp Syncytial Virus by PCR NEGATIVE NEGATIVE Final    Comment: (NOTE) Fact Sheet for Patients: bloggercourse.com  Fact Sheet for Healthcare Providers: seriousbroker.it  This test is not yet approved or cleared by the [place]  FDA and has been authorized for detection and/or diagnosis of SARS-CoV-2 by FDA under an Emergency Use Authorization (EUA). This EUA will remain in effect (meaning this test can be used) for the duration of the COVID-19 declaration under Section 564(b)(1) of the Act, 21 U.S.C. section 360bbb-3(b)(1), unless the authorization is terminated or revoked.  Performed at [facility], [address] [place], [place] [address]   MRSA Next Gen by PCR, Nasal     Status: None  Collection Time: 07/01/24  7:46 AM   Specimen: Nasal Mucosa; Nasal Swab  Result Value Ref Range Status   MRSA by PCR Next Gen NOT DETECTED NOT DETECTED Final    Comment: (NOTE) The GeneXpert MRSA Assay (FDA approved for NASAL specimens only), is one component of a comprehensive MRSA colonization surveillance program. It is not intended to diagnose MRSA infection nor to guide or monitor treatment for MRSA infections. Test performance is not FDA approved in patients less than 29 years old. Performed at [facility], [address] [place], [place] [address]     Coagulation Studies: Recent Labs    06/30/24 1337  LABPROT 16.9*  INR 1.3*    Urinalysis: No results for input(s): COLORURINE, LABSPEC, PHURINE, GLUCOSEU, HGBUR, BILIRUBINUR, KETONESUR, PROTEINUR, UROBILINOGEN, NITRITE, LEUKOCYTESUR in the last 72 hours.  Invalid input(s): APPERANCEUR    Imaging: X-ray chest PA and lateral Result Date: 07/01/2024 EXAM: 2 VIEW(S) XRAY OF THE CHEST 07/01/2024 01:08:22 AM COMPARISON: 06/30/2024 CLINICAL HISTORY: 141880 SOB (shortness of  breath) 141880 SOB (shortness of breath) FINDINGS: LINES, TUBES AND DEVICES: Left dialysis catheter is unchanged. LUNGS AND PLEURA: No focal pulmonary opacity. No pulmonary edema. No pleural effusion. No pneumothorax. HEART AND MEDIASTINUM: No acute abnormality of the cardiac and mediastinal silhouettes. BONES AND SOFT TISSUES: No acute osseous abnormality. IMPRESSION: 1. No acute cardiopulmonary process. 2. Left dialysis catheter is unchanged. Electronically signed by: Franky Crease MD 07/01/2024 01:31 AM EST RP Workstation: [id number]   DG Chest Port 1 View Result Date: 06/30/2024 CLINICAL DATA:  Short of breath, fever, chest pain since yesterday EXAM: PORTABLE CHEST 1 VIEW COMPARISON:  06/26/2024 FINDINGS: Single frontal view of the chest demonstrates stable left internal jugular dialysis catheter. The cardiac silhouette remains enlarged. No acute airspace disease, effusion, or pneumothorax. No acute bony abnormalities. IMPRESSION: 1. Stable chest, no acute process. Electronically Signed   By: Ozell Daring M.D.   On: 06/30/2024 13:53     Medications:    ceFEPime (MAXIPIME) IV     lactated ringers  Stopped (06/30/24 2313)    heparin   5,000 Units Subcutaneous Q8H   midodrine  10 mg Oral TID WC   multivitamin  1 tablet Oral QHS   tamsulosin   0.4 mg Oral Daily   acetaminophen  **OR** acetaminophen , ondansetron  **OR** ondansetron  (ZOFRAN ) IV, polyethylene glycol  Assessment/ Plan:  Joseph Gilmore is a 53 y.o.  male with past medical history of autism, hypertension, OCD, and end stage renal disease on hemodialysis. Patient presented for scheduled permcath exchange with vascular surgery. After procedure, patient became agitated with intermittent somnolence. Required Narcan without sufficient response. He was admitted overnight for observation.   End stage renal disease on hemodialysis.  Last treatment received on Saturday.  Due to presence of infection, we will be performing a dialysis  treatment today with plans to remove PermCath and allow for line free holiday later today or tomorrow.  Next dialysis treatment will be scheduled when line is replaced.  May consider HD temp cath if treatment needed urgently during line holiday.  Anemia of chronic kidney disease  Lab Results  Component Value Date   HGB 8.3 (L) 07/01/2024   Hgb decreased.  Will defer ESA due to blood pressure  3. 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 Most recent hemoglobin A1c is 5.2 on 06/26/24.     LOS: 1 Lea Baine 11/3/20253:11 PM

## 2024-07-01 NOTE — Progress Notes (Signed)
 Progress Note   Patient: Joseph Gilmore FMW:[id number] DOB: 08-07-1971 DOA: 06/30/2024     1 DOS: the patient was seen and examined on 07/01/2024   Brief hospital course: Joseph Gilmore is a 53 y.o. male with medical history significant of ESRD on TTS dialysis schedule, autistic with history of hypertension but not on any medication now came to ED with concern of cough and shortness of breath started yesterday after dialysis.   Patient had his HD catheter exchange by vascular surgery on 10/29, last dialysis was on Saturday.   On presentation patient was found to be febrile at 103.2, tachycardic at 117, tachypneic 22 and borderline soft blood pressure at 90/54, CBC with neutrophilic predominant leukocytosis at 12.6, renal function consistent with ESRD, lactic acid normal.  Troponin 122, respiratory panel negative for COVID, influenza and RSV. CXR with no acute abnormality. EKG with sinus tachycardia and nonspecific ST changes.   ED provider discussed the case with his vascular surgeon and they advised continue broad-spectrum antibiotics and obtain peripheral cultures.  HD catheter site without any obvious sign of infection.  11/3: Afebrile this morning.  Blood cultures growing Serratia marcescens.  Repeat chest x-ray this morning remain negative for any acute abnormality.  Concern of catheter acquired infection, apparently it was a difficult insertion after multiple attempts.  Case was discussed with nephrology and vascular surgery and plan is to remove catheter after dialysis today to give him a holiday.  They will reinsert another catheter in 1 to 2 days to continue with dialysis.  Vancomycin  was discontinued.  Assessment and Plan: * Sepsis [facility]) Patient met sepsis criteria with tachycardia, tachypnea, fever and leukocytosis. Initial respiratory panel negative for COVID, influenza and RSV.  Chest x-ray remained negative. Blood cultures growing Serratia marcescens -concern of catheter  acquired infection Expanded RVP pending, MRSA PCR negative -Vascular to remove HD catheter after dialysis today and they will replace in 1 to 2 days -Continue with cefepime -Stop vancomycin  -Follow-up final culture results  ESRD on dialysis [facility]) Patient is on TTS schedule, last dialysis was on Saturday. - Patient was taken for another dialysis today before removing HD catheter for concern of catheter acquired infection.  Elevated troponin Patient denies any chest pain but having some shortness of breath.  Troponin elevated at 122>>68.  EKG with sinus tachycardia and nonspecific ST changes.  Likely secondary to demand ischemia with sepsis. -Continue to monitor  Hypertension Blood pressure currently within goal Not on any antihypertensives at home ,uses midodrine on the day of dialysis. -Continue with 3 times daily midodrine for now - Continue to monitor  BPH (benign prostatic hyperplasia) - Continue Flomax   Obesity, Class III, BMI 40-49.9 (morbid obesity) (HCC) Estimated body mass index is 40.22 kg/m as calculated from the following:   Height as of this encounter: 5' 8 (1.727 m).   Weight as of this encounter: 120 kg.   - This will complicate overall prognosis -Encouraged weight loss   Subjective: Patient was seen and examined today.  No new concern.  Not much upper respiratory symptoms.  Physical Exam: Vitals:   07/01/24 1320 07/01/24 1336 07/01/24 1400 07/01/24 1430  BP: 114/80 127/74 (!) 144/78 (!) 126/101  Pulse: 82 79 76 79  Resp: (!) 21 (!) 30 (!) 22 (!) 23  Temp: 99.2 F (37.3 C)     TempSrc: Oral     SpO2: 98% 99% 98% 100%  Weight: 122.9 kg     Height:  General.  Morbidly obese gentleman, in no acute distress. Pulmonary.  Lungs clear bilaterally, normal respiratory effort. CV.  Regular rate and rhythm, no JVD, rub or murmur. Abdomen.  Soft, nontender, nondistended, BS positive. CNS.  Alert and oriented .  No focal neurologic deficit. Extremities.   No edema, no cyanosis, pulses intact and symmetrical.  Data Reviewed: Prior data reviewed  Family Communication: Discussed with aunt at bedside  Disposition: Status is: Inpatient Remains inpatient appropriate because: Severity of illness  Planned Discharge Destination: Home  DVT prophylaxis.  Subcu heparin  Time spent: 50 minutes  This record has been created using Conservation officer, historic buildings. Errors have been sought and corrected,but may not always be located. Such creation errors do not reflect on the standard of care.   Author: Amaryllis Dare, MD 07/01/2024 3:08 PM  For on call review [email or web address].

## 2024-07-01 NOTE — Op Note (Signed)
  OPERATIVE NOTE   PROCEDURE: Removal of a left IJ tunneled dialysis catheter  PRE-OPERATIVE DIAGNOSIS: Complication of dialysis catheter, End stage renal disease  POST-OPERATIVE DIAGNOSIS: Same  SURGEON: Cordella Shawl, M.D.  ANESTHESIA: Local anesthetic with 1% lidocaine  with epinephrine    ESTIMATED BLOOD LOSS: Minimal   FINDING(S): 1. Catheter intact   SPECIMEN(S):  Catheter  INDICATIONS:   Joseph Gilmore is a 53 y.o. male who presents with sepsis.  The patient has concerns for infection of the catheter.  Therefore the catheter is being removed and the tip will be cultured and microbiology.  Risks and benefits of been reviewed all questions answered and the patient's father who is his power of attorney has agreed for us  to proceed.   DESCRIPTION: After obtaining full informed written consent, the patient was positioned supine. The left IJ tunneled catheter and surrounding area is prepped and draped in a sterile fashion. The cuff was localized by palpation and noted to be less than 3 cm from the exit site. After appropriate timeout is called, 1% lidocaine  with epinephrine  is infiltrated into the surrounding tissues around the cuff. Small transverse incision is created at the exit site and the dissection was carried up along the catheter bluntly to expose the cuff of the tunneled catheter.  The catheter cuff is then freed from the surrounding attachments and adhesions. Once the catheter has been freed circumferentially it is removed in 1 piece. Light pressure was held at the base of the neck.  The tip of the catheter is then transected with sterile scissors and the specimen is placed in a sterile cup.  It is sent to microbiology.  Antibiotic ointment and a sterile dressing is applied to the exit site. Patient tolerated procedure well and there were no complications.  COMPLICATIONS: None  CONDITION: Unchanged  Cordella Shawl, M.D. [facility] Office:  [phone]  07/01/2024,5:56 PM

## 2024-07-01 NOTE — Progress Notes (Signed)
 PHARMACY - PHYSICIAN COMMUNICATION CRITICAL VALUE ALERT - BLOOD CULTURE IDENTIFICATION (BCID)  Joseph Gilmore is an 53 y.o. male who presented to [facility] on 06/30/2024 with a chief complaint of cough and shortness of breath  Assessment: 11/2 blood cultures with GNR, BCID detects Serratia.  Source not clear on antibiotics for possible pneumonia  Name of physician (or Provider) Contacted: Dr Caleen  Current antibiotics: Vancomycin  and cefepime  Changes to prescribed antibiotics recommended:  Recommendations accepted by provider - keep cefepime and stop vancomycin    Results for orders placed or performed during the hospital encounter of 06/30/24  Blood Culture ID Panel (Reflexed) (Collected: 06/30/2024  1:37 PM)  Result Value Ref Range   Enterococcus faecalis NOT DETECTED NOT DETECTED   Enterococcus Faecium NOT DETECTED NOT DETECTED   Listeria monocytogenes NOT DETECTED NOT DETECTED   Staphylococcus species NOT DETECTED NOT DETECTED   Staphylococcus aureus (BCID) NOT DETECTED NOT DETECTED   Staphylococcus epidermidis NOT DETECTED NOT DETECTED   Staphylococcus lugdunensis NOT DETECTED NOT DETECTED   Streptococcus species NOT DETECTED NOT DETECTED   Streptococcus agalactiae NOT DETECTED NOT DETECTED   Streptococcus pneumoniae NOT DETECTED NOT DETECTED   Streptococcus pyogenes NOT DETECTED NOT DETECTED   A.calcoaceticus-baumannii NOT DETECTED NOT DETECTED   Bacteroides fragilis NOT DETECTED NOT DETECTED   Enterobacterales DETECTED (A) NOT DETECTED   Enterobacter cloacae complex NOT DETECTED NOT DETECTED   Escherichia coli NOT DETECTED NOT DETECTED   Klebsiella aerogenes NOT DETECTED NOT DETECTED   Klebsiella oxytoca NOT DETECTED NOT DETECTED   Klebsiella pneumoniae NOT DETECTED NOT DETECTED   Proteus species NOT DETECTED NOT DETECTED   Salmonella species NOT DETECTED NOT DETECTED   Serratia marcescens DETECTED (A) NOT DETECTED   Haemophilus influenzae NOT DETECTED NOT DETECTED    Neisseria meningitidis NOT DETECTED NOT DETECTED   Pseudomonas aeruginosa NOT DETECTED NOT DETECTED   Stenotrophomonas maltophilia NOT DETECTED NOT DETECTED   Candida albicans NOT DETECTED NOT DETECTED   Candida auris NOT DETECTED NOT DETECTED   Candida glabrata NOT DETECTED NOT DETECTED   Candida krusei NOT DETECTED NOT DETECTED   Candida parapsilosis NOT DETECTED NOT DETECTED   Candida tropicalis NOT DETECTED NOT DETECTED   Cryptococcus neoformans/gattii NOT DETECTED NOT DETECTED   CTX-M ESBL NOT DETECTED NOT DETECTED   Carbapenem resistance IMP NOT DETECTED NOT DETECTED   Carbapenem resistance KPC NOT DETECTED NOT DETECTED   Carbapenem resistance NDM NOT DETECTED NOT DETECTED   Carbapenem resist OXA 48 LIKE NOT DETECTED NOT DETECTED   Carbapenem resistance VIM NOT DETECTED NOT DETECTED    Celestine Slovak, PharmD, BCPS, BCIDP Work Cell: [phone] 07/01/2024 10:36 AM

## 2024-07-01 NOTE — Plan of Care (Signed)

## 2024-07-01 NOTE — Progress Notes (Signed)
   07/01/24 1708  Vitals  Temp 99.9 F (37.7 C)  Temp Source Oral  BP (!) 113/54  MAP (mmHg) 72  BP Location Right Arm  BP Method Automatic  Pulse Rate (!) 129  Pulse Rate Source  (Monitored has been d/cd per floor nurse.)  ECG Heart Rate (!) 126  Resp (!) 29  Weight 121.3 kg  Type of Weight Post-Dialysis  Oxygen Therapy  SpO2 98 %  O2 Device Room Air  Patient Activity (if Appropriate) In bed  Pulse Oximetry Type Continuous  During Treatment Monitoring  Blood Flow Rate (mL/min) 0 mL/min  Arterial Pressure (mmHg) -14.75 mmHg  Venous Pressure (mmHg) -14.95 mmHg  TMP (mmHg) 13.33 mmHg  Ultrafiltration Rate (mL/min) 624 mL/min  Dialysate Flow Rate (mL/min) 300 ml/min  Dialysate Potassium Concentration 3  Dialysate Calcium  Concentration 2.5  Duration of HD Treatment -hour(s) 3.5 hour(s)  Cumulative Fluid Removed (mL) per Treatment  1500.13  Post Treatment  Dialyzer Clearance Heavily streaked  Hemodialysis Intake (mL) 0 mL  Liters Processed 84  Fluid Removed (mL) 1500 mL  Tolerated HD Treatment Yes (Tx has been tolerated. Set goal of 1500 has been met. Vs are stable. Patient sent to [facility] for CVC removal. Patient is in no apparent distress. No verbalized concerns post tx.)  Post-Hemodialysis Comments tx completed  AVG/AVF Arterial Site Held (minutes) 0 minutes  AVG/AVF Venous Site Held (minutes) 0 minutes  Hemodialysis Catheter Left Internal jugular Double lumen Permanent (Tunneled)  Placement Date/Time: 06/26/24 1237   Serial / Lot #: [id number]  Expiration Date: 02/26/27  Time Out: Correct patient;Correct site;Correct procedure  Maximum sterile barrier precautions: Hand hygiene;Cap;Mask;Sterile gown;Sterile gloves;Large sterile ...  Site Condition No complications  Blue Lumen Status Flushed;Saline locked  Red Lumen Status Flushed;Saline locked  Purple Lumen Status N/A  Catheter fill solution Other (Comment) (Line to be removed at [facility] this evening)  Dressing Type  Gauze/Drain sponge  Dressing Status Clean, Dry, Intact;Clean;Dry  Interventions Dressing reinforced  Drainage Description None  Post treatment catheter status Capped and Clamped

## 2024-07-01 NOTE — Progress Notes (Addendum)
 Patient BP is low 76/67,asymptomatic.MD made aware.Midodrine given at 2211 per order.

## 2024-07-01 NOTE — Consult Note (Signed)
 Hospital Consult    Reason for Consult: Dialysis Access removal due to Sepsis/Infection Requesting Physician:  Faith Harris NP  MRN #:  [id number]  History of Present Illness: This is a 53 y.o. male  with past medical history of autism, hypertension, OCD, and end stage renal disease on hemodialysis. Patient presents to ED with shortness of breath and has been admitted for shortness of breath, sepsis, fever.  On 06/26/2024 patient had a dialysis perm catheter exchange due to a nonfunctional PermCath.  Upon workup patient was noted to have blood cultures from 06/30/2024 positive with GNR, BC ID detecting Serratia.  There is no notable source due to the patient being on antibiotics for possible pneumonia.  Therefore this could possibly be catheter infection.  Vascular surgery was consulted to remove dialysis PermCath.  Past Medical History:  Diagnosis Date   Autism    Chronic kidney disease    ESRD (end stage renal disease) (HCC)    Hypertension    Murmur    OCD (obsessive compulsive disorder)     Past Surgical History:  Procedure Laterality Date   A/V FISTULAGRAM Left 10/07/2020   Procedure: A/V FISTULAGRAM;  Surgeon: Jama Cordella [name], MD;  Location: [facility];  Service: Cardiovascular;  Laterality: Left;   A/V FISTULAGRAM Left 02/15/2023   Procedure: A/V Fistulagram;  Surgeon: Jama Cordella [name], MD;  Location: [facility];  Service: Cardiovascular;  Laterality: Left;   A/V FISTULAGRAM Left 11/28/2023   Procedure: A/V Fistulagram;  Surgeon: Jama Cordella [name], MD;  Location: [facility];  Service: Cardiovascular;  Laterality: Left;   A/V FISTULAGRAM Left 12/26/2023   Procedure: A/V Fistulagram;  Surgeon: Jama Cordella [name], MD;  Location: [facility];  Service: Cardiovascular;  Laterality: Left;   AV FISTULA PLACEMENT Left 07/15/2020   Procedure: ARTERIOVENOUS FISTULA CREATION;  Surgeon: Jama Cordella [name], MD;  Location: [facility];  Service:  Vascular;  Laterality: Left;   AV FISTULA PLACEMENT Left 10/04/2023   Procedure: INSERTION OF ARTERIOVENOUS (AV) GORE-TEX GRAFT ARM (BRACHIAL AXILLARY);  Surgeon: Jama Cordella [name], MD;  Location: [facility];  Service: Vascular;  Laterality: Left;   DIALYSIS/PERMA CATHETER INSERTION N/A 04/06/2020   Procedure: DIALYSIS/PERMA CATHETER INSERTION;  Surgeon: Marea Selinda RAMAN, MD;  Location: [facility];  Service: Cardiovascular;  Laterality: N/A;   DIALYSIS/PERMA CATHETER INSERTION N/A 05/26/2020   Procedure: DIALYSIS/PERMA CATHETER INSERTION;  Surgeon: Jama Cordella [name], MD;  Location: [facility];  Service: Cardiovascular;  Laterality: N/A;   DIALYSIS/PERMA CATHETER INSERTION N/A 07/09/2020   Procedure: DIALYSIS/PERMA CATHETER INSERTION;  Surgeon: Marea Selinda RAMAN, MD;  Location: [facility];  Service: Cardiovascular;  Laterality: N/A;   DIALYSIS/PERMA CATHETER INSERTION N/A 01/08/2021   Procedure: DIALYSIS/PERMA CATHETER INSERTION;  Surgeon: Marea Selinda RAMAN, MD;  Location: [facility];  Service: Cardiovascular;  Laterality: N/A;   DIALYSIS/PERMA CATHETER INSERTION N/A 03/17/2023   Procedure: DIALYSIS/PERMA CATHETER INSERTION;  Surgeon: Jama Cordella [name], MD;  Location: [facility];  Service: Cardiovascular;  Laterality: N/A;   DIALYSIS/PERMA CATHETER INSERTION N/A 06/26/2024   Procedure: DIALYSIS/PERMA CATHETER INSERTION;  Surgeon: Marea Selinda RAMAN, MD;  Location: [facility];  Service: Cardiovascular;  Laterality: N/A;   DIALYSIS/PERMA CATHETER REMOVAL N/A 03/15/2021   Procedure: DIALYSIS/PERMA CATHETER REMOVAL;  Surgeon: Marea Selinda RAMAN, MD;  Location: [facility];  Service: Cardiovascular;  Laterality: N/A;   REVISON OF ARTERIOVENOUS FISTULA Left 12/11/2020   Procedure: ARTERIOVENOUS (AV) FISTULA  CREATION ( BRACHIAL CEPHALIC );  Surgeon: Jama Cordella [name], MD;  Location: [facility];  Service: Vascular;  Laterality: Left;   TEMPORARY DIALYSIS CATHETER N/A  03/15/2023   Procedure: TEMPORARY DIALYSIS CATHETER;  Surgeon: Jama Cordella [name], MD;  Location: [facility];  Service: Cardiovascular;  Laterality: N/A;    Allergies  Allergen Reactions   Chlorhexidine  Other (See Comments)    Blisters (topical)    Prior to Admission medications   Medication Sig Start Date End Date Taking? Authorizing Provider  Cholecalciferol (VITAMIN D -3) 125 MCG (5000 UT) TABS Take 5,000 Units by mouth daily.   Yes [provider]  dutasteride  (AVODART ) 0.5 MG capsule TAKE ONE CAPSULE BY MOUTH DAILY 07/24/23  Yes Stoioff, Scott C, MD  multivitamin (RENA-VIT) TABS tablet Take by mouth. 06/12/20  Yes [provider]  tamsulosin  (FLOMAX ) 0.4 MG CAPS capsule TAKE ONE CAPSULE BY MOUTH DAILY 07/24/23  Yes Stoioff, Glendia BROCKS, MD  calcium  acetate (PHOSLO ) 667 MG capsule Take 667 mg by mouth 3 (three) times daily with meals. Patient not taking: No sig reported 11/03/23 11/01/24  [provider]  HYDROcodone -acetaminophen  (NORCO) 5-325 MG tablet Take 1-2 tablets by mouth every 6 (six) hours as needed for moderate pain (pain score 4-6) or severe pain (pain score 7-10). Patient not taking: No sig reported 10/04/23   Schnier, Cordella [name], MD  Methoxy PEG-Epoetin  Beta (MIRCERA IJ) Mircera Patient not taking: Reported on 06/26/2024 08/19/23 08/17/24  [provider]  midodrine (PROAMATINE) 2.5 MG tablet Take 2.5 mg by mouth See admin instructions. Take 2.5 mg by mouth on Tuesday, Thursday and Saturday at dialysis as needed for low blood pressure. Patient not taking: Reported on 06/30/2024    [provider]    Social History   Socioeconomic History   Marital status: Single    Spouse name: Not on file   Number of children: Not on file   Years of education: Not on file   Highest education level: Not on file  Occupational History   Not on file  Tobacco Use   Smoking status: Never    Passive exposure: Never   Smokeless tobacco: Never   Vaping Use   Vaping status: Never Used  Substance and Sexual Activity   Alcohol use: Never   Drug use: Never   Sexual activity: Not Currently  Other Topics Concern   Not on file  Social History Narrative   Lives with father .   Social Drivers of Corporate Investment Banker Strain: Not on file  Food Insecurity: Patient Unable To Answer (06/30/2024)   Hunger Vital Sign    Worried About Running Out of Food in the Last Year: Patient unable to answer    Ran Out of Food in the Last Year: Patient unable to answer  Transportation Needs: Patient Unable To Answer (06/30/2024)   PRAPARE - Transportation    Lack of Transportation (Medical): Patient unable to answer    Lack of Transportation (Non-Medical): Patient unable to answer  Physical Activity: Not on file  Stress: Not on file  Social Connections: Not on file  Intimate Partner Violence: Patient Declined (06/30/2024)   Humiliation, Afraid, Rape, and Kick questionnaire    Fear of Current or Ex-Partner: Patient declined    Emotionally Abused: Patient declined    Physically Abused: Patient declined    Sexually Abused: Patient declined     History reviewed. No pertinent family history.  ROS: Otherwise negative unless mentioned in HPI  Physical Examination  Vitals:   07/01/24 1500 07/01/24 1530  BP: (!) 151/129 (!) 145/92  Pulse: 94 (!) 119  Resp: (!) 22 (!) 24  Temp:    SpO2: 100% 100%   Body mass index is 41.2 kg/m.  General:  WDWN in NAD Gait: Not observed HENT: WNL, normocephalic Pulmonary: normal non-labored breathing, without Rales, rhonchi,  wheezing Cardiac: regular, without  Murmurs, rubs or gallops; without carotid bruits Abdomen: Positive bowel sounds throughout, soft, NT/ND, no masses Skin: without rashes Vascular Exam/Pulses: Palpable pulses throughout and all extremities are warm to touch.  Extremities: without ischemic changes, without Gangrene , without cellulitis; without open wounds;  Musculoskeletal:  no muscle wasting or atrophy  Neurologic: A&O X 3;  No focal weakness or paresthesias are detected; speech is fluent/normal Psychiatric:  The pt has Autism affect. Lymph:  Unremarkable  CBC    Component Value Date/Time   WBC 9.8 07/01/2024 0419   RBC 2.65 (L) 07/01/2024 0419   HGB 8.3 (L) 07/01/2024 0419   HGB 14.9 05/06/2024 0936   HCT 24.8 (L) 07/01/2024 0419   HCT 45.4 05/06/2024 0936   PLT 134 (L) 07/01/2024 0419   PLT 316 05/06/2024 0936   MCV 93.6 07/01/2024 0419   MCV 83 05/06/2024 0936   MCH 31.3 07/01/2024 0419   MCHC 33.5 07/01/2024 0419   RDW 18.6 (H) 07/01/2024 0419   RDW 13.4 05/06/2024 0936   LYMPHSABS 1.0 06/30/2024 1337   LYMPHSABS 1.9 05/06/2024 0936   MONOABS 0.9 06/30/2024 1337   EOSABS 0.1 06/30/2024 1337   EOSABS 0.1 05/06/2024 0936   BASOSABS 0.1 06/30/2024 1337   BASOSABS 0.1 05/06/2024 0936    BMET    Component Value Date/Time   NA 132 (L) 07/01/2024 0419   NA 140 05/06/2024 0936   K 3.8 07/01/2024 0419   CL 96 (L) 07/01/2024 0419   CO2 20 (L) 07/01/2024 0419   GLUCOSE 81 07/01/2024 0419   BUN 69 (H) 07/01/2024 0419   BUN 18 05/06/2024 0936   CREATININE 17.48 (H) 07/01/2024 0419   CALCIUM  6.6 (L) 07/01/2024 0419   GFRNONAA 3 (L) 07/01/2024 0419   GFRAA 8 (L) 04/24/2020 1219    COAGS: Lab Results  Component Value Date   INR 1.3 (H) 06/30/2024   INR 1.1 12/09/2020   INR 1.1 07/13/2020     Non-Invasive Vascular Imaging:   None Ordered. Noted to have Positive blood cultures.   Statin:  No. Beta Blocker:  No. Aspirin:  No. ACEI:  No. ARB:  No. CCB use:  No Other antiplatelets/anticoagulants:  No.    ASSESSMENT/PLAN: This is a 53 y.o. male well-known to vein and vascular services.  He has significant end-stage renal disease and was on a Tuesday Thursday Saturday dialysis schedule.  He has a history of autism and hypertension.  He came to the emergency department yesterday with concerns of cough shortness of breath which started  after dialysis.  He recently had an permacath exchange by Dr. Selinda Gu on 06/26/2024.  His last dialysis was on Saturday, 06/29/2024.  Upon workup patient was noted to have a temperature of 103.2, tachycardia at 117, respirations of 22 with a soft blood pressure of 90/54.  There is no obvious source of infection due to the patient being on antibiotics for questionable pneumonia.  However he does have positive blood cultures and therefore his dialysis PermCath could be a site of infection and needs to be removed.  I obtained consent over the phone with the patient's  father Josue Falconi Senior.  I discussed in detail with him the procedure, benefits, risk, complications.  He verbalizes understanding and gives consent to proceed.  I answered all his questions today.  Patient does not need to be n.p.o. for this procedure.   - I discussed the case with Dr. Cordella Shawl MD and he agrees with the plan.   Gwendlyn JONELLE Shank [facility] 07/01/2024 3:41 PM

## 2024-07-02 ENCOUNTER — Encounter: Payer: Self-pay | Admitting: Vascular Surgery

## 2024-07-02 DIAGNOSIS — A419 Sepsis, unspecified organism: Secondary | ICD-10-CM | POA: Diagnosis not present

## 2024-07-02 DIAGNOSIS — N186 End stage renal disease: Secondary | ICD-10-CM | POA: Diagnosis not present

## 2024-07-02 DIAGNOSIS — I10 Essential (primary) hypertension: Secondary | ICD-10-CM | POA: Diagnosis not present

## 2024-07-02 DIAGNOSIS — R7989 Other specified abnormal findings of blood chemistry: Secondary | ICD-10-CM | POA: Diagnosis not present

## 2024-07-02 NOTE — Assessment & Plan Note (Addendum)
 Patient met sepsis criteria with tachycardia, tachypnea, fever and leukocytosis. respiratory panel negative for COVID, influenza and RSV.   Chest x-ray remained negative.  RVP negative Blood cultures growing pansensitive Serratia marcescens-catheter tip with a similar bacteria MRSA PCR negative.  Vancomycin  was discontinued. Repeat blood cultures done on 11/5-negative so far HD catheter was removed by vascular surgery on 10/3, will be replaced tomorrow followed by dialysis Continue with cefepime ID is recommending 4 weeks of antibiotic likely with cefepime as it can be given with dialysis  Repeat blood culture (07/03/24)results: Remain no growth at 3 days (07/06/24).

## 2024-07-02 NOTE — Progress Notes (Signed)
  Progress Note    07/02/2024 8:16 AM 1 Day Post-Op  Subjective:  Elena Cothern is a 53 yo autistic male who is POD #1 from:  PROCEDURE: Removal of a left IJ tunneled dialysis catheter  Due to sepsis and line infection. Patient is resting comfortably in bed this.  Dressing to left chest area is clean dry and intact.  No hematoma seroma noted.  Patient's vitals are remained stable.   Vitals:   07/02/24 0350 07/02/24 0813  BP: 123/82 (!) 114/59  Pulse: 72 73  Resp: 18 17  Temp: 98.4 F (36.9 C) 98.3 F (36.8 C)  SpO2: 100% 100%   Physical Exam: Cardiac:  RRR, Normal S1, S2 with Murmurs noted.  Lungs: Clear to auscultation throughout, no rales rhonchi or wheezing. Incisions: Left chest where dialysis access was removed.  Dressing clean dry and intact.  No hematoma seroma note. Extremities: Palpable pulses throughout, all extremities warm to touch. Abdomen: Positive bowel sounds throughout, soft, nontender nondistended. Neurologic: Alert and oriented x1 name only and at his baseline for his autism.  CBC    Component Value Date/Time   WBC 9.8 07/01/2024 0419   RBC 2.65 (L) 07/01/2024 0419   HGB 8.3 (L) 07/01/2024 0419   HGB 14.9 05/06/2024 0936   HCT 24.8 (L) 07/01/2024 0419   HCT 45.4 05/06/2024 0936   PLT 134 (L) 07/01/2024 0419   PLT 316 05/06/2024 0936   MCV 93.6 07/01/2024 0419   MCV 83 05/06/2024 0936   MCH 31.3 07/01/2024 0419   MCHC 33.5 07/01/2024 0419   RDW 18.6 (H) 07/01/2024 0419   RDW 13.4 05/06/2024 0936   LYMPHSABS 1.0 06/30/2024 1337   LYMPHSABS 1.9 05/06/2024 0936   MONOABS 0.9 06/30/2024 1337   EOSABS 0.1 06/30/2024 1337   EOSABS 0.1 05/06/2024 0936   BASOSABS 0.1 06/30/2024 1337   BASOSABS 0.1 05/06/2024 0936    BMET    Component Value Date/Time   NA 132 (L) 07/01/2024 0419   NA 140 05/06/2024 0936   K 3.8 07/01/2024 0419   CL 96 (L) 07/01/2024 0419   CO2 20 (L) 07/01/2024 0419   GLUCOSE 81 07/01/2024 0419   BUN 69 (H) 07/01/2024 0419    BUN 18 05/06/2024 0936   CREATININE 17.48 (H) 07/01/2024 0419   CALCIUM  6.6 (L) 07/01/2024 0419   GFRNONAA 3 (L) 07/01/2024 0419   GFRAA 8 (L) 04/24/2020 1219    INR    Component Value Date/Time   INR 1.3 (H) 06/30/2024 1337     Intake/Output Summary (Last 24 hours) at 07/02/2024 0816 Last data filed at 07/01/2024 1708 Gross per 24 hour  Intake 240 ml  Output 1500 ml  Net -1260 ml     Assessment/Plan:  53 y.o. male is s/p See Above 1 Day Post-Op   PLAN Patient is recovering as expected post dialysis permacatheter removal.  Tip was sent for culture.  Patient will need dialysis access sometime in the near future.  Nephrology will reach out to us  when that is needed.  We can place at that time if it is appropriate.  DVT prophylaxis: Heparin  5000 units subcu every 8 hours.   Gwendlyn JONELLE Shank [facility] 07/02/2024 8:16 AM

## 2024-07-02 NOTE — Assessment & Plan Note (Signed)
 Patient is on TTS schedule, patient had dialysis on Monday and now getting couple of days of holiday. Monitor renal function Nephrology is on board

## 2024-07-02 NOTE — Assessment & Plan Note (Addendum)
 Blood pressure currently within goal Not on any antihypertensives at home ,uses midodrine on the day of dialysis. -Continue with 3 times daily midodrine for now - Continue to monitor

## 2024-07-02 NOTE — Progress Notes (Signed)
 Progress Note   Patient: Joseph Gilmore FMW:[id number] DOB: Oct 14, 1970 DOA: 06/30/2024     2 DOS: the patient was seen and examined on 07/02/2024   Brief hospital course: Joseph Gilmore is a 53 y.o. male with medical history significant of ESRD on TTS dialysis schedule, autistic with history of hypertension but not on any medication now came to ED with concern of cough and shortness of breath started yesterday after dialysis.   Patient had his HD catheter exchange by vascular surgery on 10/29, last dialysis was on Saturday.   On presentation patient was found to be febrile at 103.2, tachycardic at 117, tachypneic 22 and borderline soft blood pressure at 90/54, CBC with neutrophilic predominant leukocytosis at 12.6, renal function consistent with ESRD, lactic acid normal.  Troponin 122, respiratory panel negative for COVID, influenza and RSV. CXR with no acute abnormality. EKG with sinus tachycardia and nonspecific ST changes.   ED provider discussed the case with his vascular surgeon and they advised continue broad-spectrum antibiotics and obtain peripheral cultures.  HD catheter site without any obvious sign of infection.  11/3: Afebrile this morning.  Blood cultures growing Serratia marcescens.  Repeat chest x-ray this morning remain negative for any acute abnormality.  Concern of catheter acquired infection, apparently it was a difficult insertion after multiple attempts.  Case was discussed with nephrology and vascular surgery and plan is to remove catheter after dialysis today to give him a holiday.  They will reinsert another catheter in 1 to 2 days to continue with dialysis.  Vancomycin  was discontinued.  11/4: HD catheter was removed yesterday by vascular surgery, preliminary catheter tip cultures negative.  Nephrology will give 2 days of catheter holiday and then they will place another HD catheter to continue dialysis.  Susceptibility results from blood cultures are  pending  Assessment and Plan: * Sepsis [facility]) Patient met sepsis criteria with tachycardia, tachypnea, fever and leukocytosis. respiratory panel negative for COVID, influenza and RSV.   Chest x-ray remained negative.  RVP negative Blood cultures growing Serratia marcescens-pending susceptibility-concern of catheter acquired infection MRSA PCR negative.  Vancomycin  was discontinued. HD catheter was removed by vascular surgery on 10/3, nephrology is planning to replace in 2 days Preliminary catheter tip cultures negative -Continue with cefepime -Follow-up final culture results  ESRD on dialysis [facility]) Patient is on TTS schedule, patient had dialysis on Monday and now getting couple of days of holiday. - Monitor renal function - Nephrology is on board  Elevated troponin Patient denies any chest pain but having some shortness of breath.  Troponin elevated at 122>>68.  EKG with sinus tachycardia and nonspecific ST changes.  Likely secondary to demand ischemia with sepsis. -Continue to monitor  Hypertension Blood pressure currently within goal Not on any antihypertensives at home ,uses midodrine on the day of dialysis. -Continue with 3 times daily midodrine for now - Continue to monitor  BPH (benign prostatic hyperplasia) - Continue Flomax   Obesity, Class III, BMI 40-49.9 (morbid obesity) (HCC) Estimated body mass index is 40.22 kg/m as calculated from the following:   Height as of this encounter: 5' 8 (1.727 m).   Weight as of this encounter: 120 kg.   - This will complicate overall prognosis -Encouraged weight loss   Subjective: Patient was resting comfortably when seen today.  No new concern.  Physical Exam: Vitals:   07/01/24 2009 07/01/24 2256 07/02/24 0350 07/02/24 0813  BP: (!) 126/103 (!) 153/85 123/82 (!) 114/59  Pulse: (!) 115 84 72  73  Resp: 16 17 18 17   Temp: (!) 100.6 F (38.1 C) 98.9 F (37.2 C) 98.4 F (36.9 C) 98.3 F (36.8 C)  TempSrc: Oral Oral Oral  Oral  SpO2: (!) 86% 95% 100% 100%  Weight:      Height:       General.  Morbidly obese, autistic gentleman, in no acute distress. Pulmonary.  Lungs clear bilaterally, normal respiratory effort. CV.  Regular rate and rhythm, no JVD, rub or murmur. Abdomen.  Soft, nontender, nondistended, BS positive. CNS.  Alert and oriented .  No focal neurologic deficit. Extremities.  No edema, no cyanosis, pulses intact and symmetrical.  Data Reviewed: Prior data reviewed  Family Communication: Discussed with aunt at bedside  Disposition: Status is: Inpatient Remains inpatient appropriate because: Severity of illness  Planned Discharge Destination: Home  DVT prophylaxis.  Subcu heparin  Time spent: 50 minutes  This record has been created using Conservation officer, historic buildings. Errors have been sought and corrected,but may not always be located. Such creation errors do not reflect on the standard of care.   Author: Amaryllis Dare, MD 07/02/2024 3:30 PM  For on call review [email or web address].

## 2024-07-02 NOTE — Care Management Important Message (Signed)
 Important Message  Patient Details  Name: Joseph Gilmore MRN: [id number] Date of Birth: 09-22-1970   Important Message Given:  Yes - Medicare IM     [name] 07/02/2024, 12:08 PM

## 2024-07-02 NOTE — Progress Notes (Signed)
 [facility]  ROUNDING NOTE   Subjective:   Joseph Gilmore is a 53 y.o. male with past medical history of autism, hypertension, OCD, and end stage renal disease on hemodialysis. Patient presents to ED with shortness of breath and has been admitted for Shortness of breath [R06.02] Sepsis (HCC) [A41.9] Fever, unspecified fever cause [R50.9]  Patient is known to our practice and receives outpatient dialysis treatments at [facility] on a TTS schedule, supervised by Dr Douglas.    Update: Patient resting quietly in bed Alert Denies shortness of breath Denies discomfort  Objective:  Vital signs in last 24 hours:  Temp:  [97.4 F (36.3 C)-100.6 F (38.1 C)] 98.3 F (36.8 C) (11/04 0813) Pulse Rate:  [72-132] 73 (11/04 0813) Resp:  [16-34] 17 (11/04 0813) BP: (89-153)/(51-129) 114/59 (11/04 0813) SpO2:  [86 %-100 %] 100 % (11/04 0813) Weight:  [121.3 kg-122.9 kg] 121.3 kg (11/03 1708)  Weight change: 2.9 kg Filed Weights   06/30/24 1301 07/01/24 1320 07/01/24 1708  Weight: 120 kg 122.9 kg 121.3 kg    Intake/Output: I/O last 3 completed shifts: In: 530.7 [P.O.:240; I.V.:290.7] Out: 1500 [Other:1500]   Intake/Output this shift:  No intake/output data recorded.  Physical Exam: General: NAD  Head: Normocephalic, atraumatic. Moist oral mucosal membranes  Eyes: Anicteric  Lungs:  Diminished, normal effort  Heart: Regular rate and rhythm  Abdomen:  Soft, nontender  Extremities:  Trace peripheral edema.  Neurologic: Awake, alert, conversant  Skin: Warm,dry, no rash  Access: Removed on 11/3    Basic Metabolic Panel: Recent Labs  Lab 06/26/24 1541 06/30/24 1337 07/01/24 0419  NA 142 137 132*  K 3.2* 4.3 3.8  CL 99 94* 96*  CO2 16* 21* 20*  GLUCOSE 252* 92 81  BUN 39* 56* 69*  CREATININE 15.98* 15.56* 17.48*  CALCIUM  7.5* 7.8* 6.6*  MG 2.4  --   --     Liver Function Tests: Recent Labs  Lab 06/26/24 1541 06/30/24 1337  AST 92* 31  ALT 99*  18  ALKPHOS 51 59  BILITOT 0.7 0.6  PROT 6.0* 7.5  ALBUMIN 3.1* 3.1*   No results for input(s): LIPASE, AMYLASE in the last 168 hours. No results for input(s): AMMONIA in the last 168 hours.  CBC: Recent Labs  Lab 06/26/24 1541 06/30/24 1337 07/01/24 0419  WBC 15.4* 12.6* 9.8  NEUTROABS  --  10.3*  --   HGB 9.3* 10.2* 8.3*  HCT 29.4* 31.7* 24.8*  MCV 100.0 96.4 93.6  PLT 232 150 134*    Cardiac Enzymes: No results for input(s): CKTOTAL, CKMB, CKMBINDEX, TROPONINI in the last 168 hours.  BNP: Invalid input(s): POCBNP  CBG: Recent Labs  Lab 06/26/24 1516 06/26/24 1638 06/26/24 2228 06/27/24 0731 06/27/24 1305  GLUCAP 233* 223* 117* 118* 108*    Microbiology: Results for orders placed or performed during the hospital encounter of 06/30/24  Culture, blood (Routine x 2)     Status: Abnormal (Preliminary result)   Collection Time: 06/30/24  1:37 PM   Specimen: BLOOD  Result Value Ref Range Status   Specimen Description   Final    BLOOD RIGHT ANTECUBITAL Performed at [facility], [address] [place], [place] [address]    Special Requests   Final    BOTTLES DRAWN AEROBIC AND ANAEROBIC Blood Culture adequate volume Performed at [facility], [address] [place], [place] [address]    Culture  Setup Time   Final  GRAM NEGATIVE RODS AEROBIC BOTTLE ONLY CRITICAL RESULT CALLED TO, READ BACK BY AND VERIFIED WITH: MADISON HUNT PHARM.D 06/30/24 1004 KG    Culture (A)  Final    SERRATIA MARCESCENS SUSCEPTIBILITIES TO FOLLOW Performed at [facility], [address] [place], [place] [address]    Report Status PENDING  Incomplete  Blood Culture ID Panel (Reflexed)     Status: Abnormal   Collection Time: 06/30/24  1:37 PM  Result Value Ref Range Status   Enterococcus faecalis NOT DETECTED NOT DETECTED Final   Enterococcus Faecium NOT DETECTED NOT DETECTED Final   Listeria monocytogenes NOT DETECTED NOT DETECTED  Final   Staphylococcus species NOT DETECTED NOT DETECTED Final   Staphylococcus aureus (BCID) NOT DETECTED NOT DETECTED Final   Staphylococcus epidermidis NOT DETECTED NOT DETECTED Final   Staphylococcus lugdunensis NOT DETECTED NOT DETECTED Final   Streptococcus species NOT DETECTED NOT DETECTED Final   Streptococcus agalactiae NOT DETECTED NOT DETECTED Final   Streptococcus pneumoniae NOT DETECTED NOT DETECTED Final   Streptococcus pyogenes NOT DETECTED NOT DETECTED Final   A.calcoaceticus-baumannii NOT DETECTED NOT DETECTED Final   Bacteroides fragilis NOT DETECTED NOT DETECTED Final   Enterobacterales DETECTED (A) NOT DETECTED Final    Comment: Enterobacterales represent a large order of gram negative bacteria, not a single organism. CRITICAL RESULT CALLED TO, READ BACK BY AND VERIFIED WITH: MADISON HUNT PHARM.D 07/01/24 1004 KG    Enterobacter cloacae complex NOT DETECTED NOT DETECTED Final   Escherichia coli NOT DETECTED NOT DETECTED Final   Klebsiella aerogenes NOT DETECTED NOT DETECTED Final   Klebsiella oxytoca NOT DETECTED NOT DETECTED Final   Klebsiella pneumoniae NOT DETECTED NOT DETECTED Final   Proteus species NOT DETECTED NOT DETECTED Final   Salmonella species NOT DETECTED NOT DETECTED Final   Serratia marcescens DETECTED (A) NOT DETECTED Final    Comment: CRITICAL RESULT CALLED TO, READ BACK BY AND VERIFIED WITH: MADISON HUNT PHARM.D 07/01/24 1004 KG    Haemophilus influenzae NOT DETECTED NOT DETECTED Final   Neisseria meningitidis NOT DETECTED NOT DETECTED Final   Pseudomonas aeruginosa NOT DETECTED NOT DETECTED Final   Stenotrophomonas maltophilia NOT DETECTED NOT DETECTED Final   Candida albicans NOT DETECTED NOT DETECTED Final   Candida auris NOT DETECTED NOT DETECTED Final   Candida glabrata NOT DETECTED NOT DETECTED Final   Candida krusei NOT DETECTED NOT DETECTED Final   Candida parapsilosis NOT DETECTED NOT DETECTED Final   Candida tropicalis NOT DETECTED  NOT DETECTED Final   Cryptococcus neoformans/gattii NOT DETECTED NOT DETECTED Final   CTX-M ESBL NOT DETECTED NOT DETECTED Final   Carbapenem resistance IMP NOT DETECTED NOT DETECTED Final   Carbapenem resistance KPC NOT DETECTED NOT DETECTED Final   Carbapenem resistance NDM NOT DETECTED NOT DETECTED Final   Carbapenem resist OXA 48 LIKE NOT DETECTED NOT DETECTED Final   Carbapenem resistance VIM NOT DETECTED NOT DETECTED Final    Comment: Performed at [facility], [address] [place], [place] [address]  Culture, blood (Routine x 2)     Status: None (Preliminary result)   Collection Time: 06/30/24  2:59 PM   Specimen: BLOOD RIGHT FOREARM  Result Value Ref Range Status   Specimen Description   Final    BLOOD RIGHT FOREARM Performed at [facility], [address], [place], [place] [address]    Special Requests   Final    BOTTLES DRAWN AEROBIC AND ANAEROBIC Blood Culture adequate volume   Culture  Setup  Time   Final    GRAM NEGATIVE RODS IN BOTH AEROBIC AND ANAEROBIC BOTTLES CRITICAL VALUE NOTED.  VALUE IS CONSISTENT WITH PREVIOUSLY REPORTED AND CALLED VALUE. Performed at [facility], [address] [place], [place] [address]    Culture GRAM NEGATIVE RODS  Final   Report Status PENDING  Incomplete  Resp panel by RT-PCR (RSV, Flu A&B, Covid) Anterior Nasal Swab     Status: None   Collection Time: 06/30/24  3:39 PM   Specimen: Anterior Nasal Swab  Result Value Ref Range Status   SARS Coronavirus 2 by RT PCR NEGATIVE NEGATIVE Final    Comment: (NOTE) SARS-CoV-2 target nucleic acids are NOT DETECTED.  The SARS-CoV-2 RNA is generally detectable in upper respiratory specimens during the acute phase of infection. The lowest concentration of SARS-CoV-2 viral copies this assay can detect is 138 copies/mL. A negative result does not preclude SARS-Cov-2 infection and should not be used as the sole basis for treatment or other patient management decisions.  A negative result may occur with  improper specimen collection/handling, submission of specimen other than nasopharyngeal swab, presence of viral mutation(s) within the areas targeted by this assay, and inadequate number of viral copies(<138 copies/mL). A negative result must be combined with clinical observations, patient history, and epidemiological information. The expected result is Negative.  Fact Sheet for Patients:  bloggercourse.com  Fact Sheet for Healthcare Providers:  seriousbroker.it  This test is no t yet approved or cleared by the [place]  FDA and  has been authorized for detection and/or diagnosis of SARS-CoV-2 by FDA under an Emergency Use Authorization (EUA). This EUA will remain  in effect (meaning this test can be used) for the duration of the COVID-19 declaration under Section 564(b)(1) of the Act, 21 U.S.C.section 360bbb-3(b)(1), unless the authorization is terminated  or revoked sooner.       Influenza A by PCR NEGATIVE NEGATIVE Final   Influenza B by PCR NEGATIVE NEGATIVE Final    Comment: (NOTE) The Xpert Xpress SARS-CoV-2/FLU/RSV plus assay is intended as an aid in the diagnosis of influenza from Nasopharyngeal swab specimens and should not be used as a sole basis for treatment. Nasal washings and aspirates are unacceptable for Xpert Xpress SARS-CoV-2/FLU/RSV testing.  Fact Sheet for Patients: bloggercourse.com  Fact Sheet for Healthcare Providers: seriousbroker.it  This test is not yet approved or cleared by the [place]  FDA and has been authorized for detection and/or diagnosis of SARS-CoV-2 by FDA under an Emergency Use Authorization (EUA). This EUA will remain in effect (meaning this test can be used) for the duration of the COVID-19 declaration under Section 564(b)(1) of the Act, 21 U.S.C. section 360bbb-3(b)(1), unless the authorization  is terminated or revoked.     Resp Syncytial Virus by PCR NEGATIVE NEGATIVE Final    Comment: (NOTE) Fact Sheet for Patients: bloggercourse.com  Fact Sheet for Healthcare Providers: seriousbroker.it  This test is not yet approved or cleared by the [place]  FDA and has been authorized for detection and/or diagnosis of SARS-CoV-2 by FDA under an Emergency Use Authorization (EUA). This EUA will remain in effect (meaning this test can be used) for the duration of the COVID-19 declaration under Section 564(b)(1) of the Act, 21 U.S.C. section 360bbb-3(b)(1), unless the authorization is terminated or revoked.  Performed at [facility], [address] [place], [place] [address]   Respiratory (~20 pathogens) panel by PCR     Status: None   Collection Time: 07/01/24  7:46 AM  Specimen: Nasopharyngeal Swab; Respiratory  Result Value Ref Range Status   Adenovirus NOT DETECTED NOT DETECTED Final   Coronavirus 229E NOT DETECTED NOT DETECTED Final    Comment: (NOTE) The Coronavirus on the Respiratory Panel, DOES NOT test for the novel  Coronavirus (2019 nCoV)    Coronavirus HKU1 NOT DETECTED NOT DETECTED Final   Coronavirus NL63 NOT DETECTED NOT DETECTED Final   Coronavirus OC43 NOT DETECTED NOT DETECTED Final   Metapneumovirus NOT DETECTED NOT DETECTED Final   Rhinovirus / Enterovirus NOT DETECTED NOT DETECTED Final   Influenza A NOT DETECTED NOT DETECTED Final   Influenza B NOT DETECTED NOT DETECTED Final   Parainfluenza Virus 1 NOT DETECTED NOT DETECTED Final   Parainfluenza Virus 2 NOT DETECTED NOT DETECTED Final   Parainfluenza Virus 3 NOT DETECTED NOT DETECTED Final   Parainfluenza Virus 4 NOT DETECTED NOT DETECTED Final   Respiratory Syncytial Virus NOT DETECTED NOT DETECTED Final   Bordetella pertussis NOT DETECTED NOT DETECTED Final   Bordetella Parapertussis NOT DETECTED NOT DETECTED Final   Chlamydophila  pneumoniae NOT DETECTED NOT DETECTED Final   Mycoplasma pneumoniae NOT DETECTED NOT DETECTED Final    Comment: Performed at [facility], [address], [place], [place] [address]  MRSA Next Gen by PCR, Nasal     Status: None   Collection Time: 07/01/24  7:46 AM   Specimen: Nasal Mucosa; Nasal Swab  Result Value Ref Range Status   MRSA by PCR Next Gen NOT DETECTED NOT DETECTED Final    Comment: (NOTE) The GeneXpert MRSA Assay (FDA approved for NASAL specimens only), is one component of a comprehensive MRSA colonization surveillance program. It is not intended to diagnose MRSA infection nor to guide or monitor treatment for MRSA infections. Test performance is not FDA approved in patients less than 20 years old. Performed at [facility], [address] [place], [place] [address]   Cath Tip Culture     Status: None (Preliminary result)   Collection Time: 07/01/24  5:56 PM   Specimen: Catheter Tip; Other  Result Value Ref Range Status   Specimen Description   Final    CATH TIP Performed at [facility], [address] [place], [place] [address]    Special Requests   Final    NONE Performed at [facility], [address] [place], [place] [address]    Culture   Final    NO GROWTH < 12 HOURS Performed at [facility], [address], [place], [place] [address]    Report Status PENDING  Incomplete    Coagulation Studies: Recent Labs    06/30/24 1337  LABPROT 16.9*  INR 1.3*    Urinalysis: No results for input(s): COLORURINE, LABSPEC, PHURINE, GLUCOSEU, HGBUR, BILIRUBINUR, KETONESUR, PROTEINUR, UROBILINOGEN, NITRITE, LEUKOCYTESUR in the last 72 hours.  Invalid input(s): APPERANCEUR    Imaging: PERIPHERAL VASCULAR CATHETERIZATION Result Date: 07/01/2024 See surgical note for result.  X-ray chest PA and lateral Result Date: 07/01/2024 EXAM: 2 VIEW(S) XRAY OF THE CHEST 07/01/2024 01:08:22 AM  COMPARISON: 06/30/2024 CLINICAL HISTORY: 141880 SOB (shortness of breath) 141880 SOB (shortness of breath) FINDINGS: LINES, TUBES AND DEVICES: Left dialysis catheter is unchanged. LUNGS AND PLEURA: No focal pulmonary opacity. No pulmonary edema. No pleural effusion. No pneumothorax. HEART AND MEDIASTINUM: No acute abnormality of the cardiac and mediastinal silhouettes. BONES AND SOFT TISSUES: No acute osseous abnormality. IMPRESSION: 1. No acute cardiopulmonary process. 2. Left dialysis catheter is unchanged. Electronically signed  by: Franky Crease MD 07/01/2024 01:31 AM EST RP Workstation: [id number]   DG Chest Port 1 View Result Date: 06/30/2024 CLINICAL DATA:  Short of breath, fever, chest pain since yesterday EXAM: PORTABLE CHEST 1 VIEW COMPARISON:  06/26/2024 FINDINGS: Single frontal view of the chest demonstrates stable left internal jugular dialysis catheter. The cardiac silhouette remains enlarged. No acute airspace disease, effusion, or pneumothorax. No acute bony abnormalities. IMPRESSION: 1. Stable chest, no acute process. Electronically Signed   By: Ozell Daring M.D.   On: 06/30/2024 13:53     Medications:    ceFEPime (MAXIPIME) IV 1 g (07/01/24 2015)    heparin   5,000 Units Subcutaneous Q8H   midodrine  10 mg Oral TID WC   multivitamin  1 tablet Oral QHS   tamsulosin   0.4 mg Oral Daily   acetaminophen  **OR** acetaminophen , ondansetron  **OR** ondansetron  (ZOFRAN ) IV, polyethylene glycol  Assessment/ Plan:  Joseph Gilmore is a 53 y.o.  male with past medical history of autism, hypertension, OCD, and end stage renal disease on hemodialysis. Patient presented for scheduled permcath exchange with vascular surgery. After procedure, patient became agitated with intermittent somnolence. Required Narcan without sufficient response. He was admitted overnight for observation.   End stage renal disease on hemodialysis.  Patient received dialysis yesterday, UF 1.5L achieved. Vascular  removed Permcath after treatment. Will allow for 2 day line holiday. If dialysis is needed, will request HD temp cath. Patient stable on exam today.   Anemia of chronic kidney disease  Lab Results  Component Value Date   HGB 8.3 (L) 07/01/2024   Will continue to monitor hgb and assess need of ESA   3. 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 Most recent hemoglobin A1c is 5.2 on 06/26/24.     LOS: 2 Joseph Gilmore 11/4/20251:02 PM

## 2024-07-03 DIAGNOSIS — T827XXA Infection and inflammatory reaction due to other cardiac and vascular devices, implants and grafts, initial encounter: Secondary | ICD-10-CM | POA: Diagnosis not present

## 2024-07-03 DIAGNOSIS — A4153 Sepsis due to Serratia: Secondary | ICD-10-CM

## 2024-07-03 DIAGNOSIS — R7989 Other specified abnormal findings of blood chemistry: Secondary | ICD-10-CM | POA: Diagnosis not present

## 2024-07-03 DIAGNOSIS — D631 Anemia in chronic kidney disease: Secondary | ICD-10-CM

## 2024-07-03 DIAGNOSIS — N186 End stage renal disease: Secondary | ICD-10-CM | POA: Diagnosis not present

## 2024-07-03 DIAGNOSIS — F840 Autistic disorder: Secondary | ICD-10-CM

## 2024-07-03 DIAGNOSIS — I120 Hypertensive chronic kidney disease with stage 5 chronic kidney disease or end stage renal disease: Secondary | ICD-10-CM | POA: Diagnosis not present

## 2024-07-03 DIAGNOSIS — A419 Sepsis, unspecified organism: Secondary | ICD-10-CM | POA: Diagnosis not present

## 2024-07-03 DIAGNOSIS — I10 Essential (primary) hypertension: Secondary | ICD-10-CM | POA: Diagnosis not present

## 2024-07-03 LAB — RENAL FUNCTION PANEL
Albumin: 2.6 g/dL — ABNORMAL LOW (ref 3.5–5.0)
Anion gap: 19 — ABNORMAL HIGH (ref 5–15)
BUN: 58 mg/dL — ABNORMAL HIGH (ref 6–20)
CO2: 22 mmol/L (ref 22–32)
Calcium: 6.9 mg/dL — ABNORMAL LOW (ref 8.9–10.3)
Chloride: 98 mmol/L (ref 98–111)
Creatinine, Ser: 16.14 mg/dL — ABNORMAL HIGH (ref 0.61–1.24)
GFR, Estimated: 3 mL/min — ABNORMAL LOW (ref >60)
Glucose, Bld: 82 mg/dL (ref 70–99)
Phosphorus: 5.1 mg/dL — ABNORMAL HIGH (ref 2.5–4.6)
Potassium: 3.9 mmol/L (ref 3.5–5.1)
Sodium: 137 mmol/L (ref 135–145)

## 2024-07-03 NOTE — Consult Note (Signed)
 NAME: Joseph Gilmore  DOB: March 13, 1971  MRN: [id number]  Date/Time: 07/03/2024 11:58 AM  REQUESTING PROVIDER: Dr. Dolly Subjective:  REASON FOR CONSULT: Serratia bacteremia Patient is a limited historian   Joseph Gilmore is a 53 y.o. with a history of autism, end-stage renal disease, hypertension, BPH, nonfunctioning left AV graft had permacath placed on 06/26/2024.  Patient's old catheter was removed at that time.  Towards the end of the procedure patient became little tachycardic agitated and low BP To ensure there was no bleeding a venogram was performed through the catheter and that seemed fine.  There was no evidence of bleeding.  For the procedure he had received 1 mg of Versed , 25 mcg of fentanyl  and 50 mg of Benadryl .  He was given Romazicon and Narcan which really did not change his mental status.  He was taken to recovery and then was admitted for observation.  He was discharged on 06/27/2024. He was brought into the ED by private vehicle on 11-25 with shortness of breath fever and chest pain of 1 day duration. In the ED vitals BP of 181/116, temperature 103.2, pulse 170 and respiratory rate of 22. Labs revealed a WBC of 12.6, Hb 10.2, platelet 150 and creatinine of 15. Blood cultures were sent and line chest x-ray revealed no infiltrate EKG taken in the ED showed sinus tachycardia.  Patient was started on vancomycin  and cefepime and Flagyl  Blood culture came back positive for Serratia The dialysis catheter was removed on 07/01/2024 and the tip of the catheter is also growing Serratia I am asked to see the patient for management of the Serratia Repeat blood cultures has been sent on 07/03/2024  Patient states he is doing fine Past Medical History:  Diagnosis Date   Autism    Chronic kidney disease    ESRD (end stage renal disease) (HCC)    Hypertension    Murmur    OCD (obsessive compulsive disorder)     Past Surgical History:  Procedure Laterality Date   A/V FISTULAGRAM  Left 10/07/2020   Procedure: A/V FISTULAGRAM;  Surgeon: Jama Cordella [name], MD;  Location: [facility];  Service: Cardiovascular;  Laterality: Left;   A/V FISTULAGRAM Left 02/15/2023   Procedure: A/V Fistulagram;  Surgeon: Jama Cordella [name], MD;  Location: [facility];  Service: Cardiovascular;  Laterality: Left;   A/V FISTULAGRAM Left 11/28/2023   Procedure: A/V Fistulagram;  Surgeon: Jama Cordella [name], MD;  Location: [facility];  Service: Cardiovascular;  Laterality: Left;   A/V FISTULAGRAM Left 12/26/2023   Procedure: A/V Fistulagram;  Surgeon: Jama Cordella [name], MD;  Location: [facility];  Service: Cardiovascular;  Laterality: Left;   AV FISTULA PLACEMENT Left 07/15/2020   Procedure: ARTERIOVENOUS FISTULA CREATION;  Surgeon: Jama Cordella [name], MD;  Location: [facility];  Service: Vascular;  Laterality: Left;   AV FISTULA PLACEMENT Left 10/04/2023   Procedure: INSERTION OF ARTERIOVENOUS (AV) GORE-TEX GRAFT ARM (BRACHIAL AXILLARY);  Surgeon: Jama Cordella [name], MD;  Location: [facility];  Service: Vascular;  Laterality: Left;   DIALYSIS/PERMA CATHETER INSERTION N/A 04/06/2020   Procedure: DIALYSIS/PERMA CATHETER INSERTION;  Surgeon: Marea Selinda RAMAN, MD;  Location: [facility];  Service: Cardiovascular;  Laterality: N/A;   DIALYSIS/PERMA CATHETER INSERTION N/A 05/26/2020   Procedure: DIALYSIS/PERMA CATHETER INSERTION;  Surgeon: Jama Cordella [name], MD;  Location: [facility];  Service: Cardiovascular;  Laterality: N/A;   DIALYSIS/PERMA CATHETER INSERTION N/A 07/09/2020   Procedure:  DIALYSIS/PERMA CATHETER INSERTION;  Surgeon: Marea Selinda RAMAN, MD;  Location: [facility];  Service: Cardiovascular;  Laterality: N/A;   DIALYSIS/PERMA CATHETER INSERTION N/A 01/08/2021   Procedure: DIALYSIS/PERMA CATHETER INSERTION;  Surgeon: Marea Selinda RAMAN, MD;  Location: [facility];  Service: Cardiovascular;  Laterality: N/A;   DIALYSIS/PERMA CATHETER INSERTION  N/A 03/17/2023   Procedure: DIALYSIS/PERMA CATHETER INSERTION;  Surgeon: Jama Cordella [name], MD;  Location: [facility];  Service: Cardiovascular;  Laterality: N/A;   DIALYSIS/PERMA CATHETER INSERTION N/A 06/26/2024   Procedure: DIALYSIS/PERMA CATHETER INSERTION;  Surgeon: Marea Selinda RAMAN, MD;  Location: [facility];  Service: Cardiovascular;  Laterality: N/A;   DIALYSIS/PERMA CATHETER REMOVAL N/A 03/15/2021   Procedure: DIALYSIS/PERMA CATHETER REMOVAL;  Surgeon: Marea Selinda RAMAN, MD;  Location: [facility];  Service: Cardiovascular;  Laterality: N/A;   DIALYSIS/PERMA CATHETER REMOVAL N/A 07/01/2024   Procedure: DIALYSIS/PERMA CATHETER REMOVAL;  Surgeon: Jama Cordella [name], MD;  Location: [facility];  Service: Cardiovascular;  Laterality: N/A;   REVISON OF ARTERIOVENOUS FISTULA Left 12/11/2020   Procedure: ARTERIOVENOUS (AV) FISTULA CREATION ( BRACHIAL CEPHALIC );  Surgeon: Jama Cordella [name], MD;  Location: [facility];  Service: Vascular;  Laterality: Left;   TEMPORARY DIALYSIS CATHETER N/A 03/15/2023   Procedure: TEMPORARY DIALYSIS CATHETER;  Surgeon: Jama Cordella [name], MD;  Location: [facility];  Service: Cardiovascular;  Laterality: N/A;    Social History   Socioeconomic History   Marital status: Single    Spouse name: Not on file   Number of children: Not on file   Years of education: Not on file   Highest education level: Not on file  Occupational History   Not on file  Tobacco Use   Smoking status: Never    Passive exposure: Never   Smokeless tobacco: Never  Vaping Use   Vaping status: Never Used  Substance and Sexual Activity   Alcohol use: Never   Drug use: Never   Sexual activity: Not Currently  Other Topics Concern   Not on file  Social History Narrative   Lives with father .   Social Drivers of Corporate Investment Banker Strain: Not on file  Food Insecurity: Patient Unable To Answer (06/30/2024)   Hunger Vital Sign    Worried  About Running Out of Food in the Last Year: Patient unable to answer    Ran Out of Food in the Last Year: Patient unable to answer  Transportation Needs: Patient Unable To Answer (06/30/2024)   PRAPARE - Transportation    Lack of Transportation (Medical): Patient unable to answer    Lack of Transportation (Non-Medical): Patient unable to answer  Physical Activity: Not on file  Stress: Not on file  Social Connections: Not on file  Intimate Partner Violence: Patient Declined (06/30/2024)   Humiliation, Afraid, Rape, and Kick questionnaire    Fear of Current or Ex-Partner: Patient declined    Emotionally Abused: Patient declined    Physically Abused: Patient declined    Sexually Abused: Patient declined    History reviewed. No pertinent family history. Allergies  Allergen Reactions   Chlorhexidine  Other (See Comments)    Blisters (topical)   I? Current Facility-Administered Medications  Medication Dose Route Frequency Provider Last Rate Last Admin   acetaminophen  (TYLENOL ) tablet 650 mg  650 mg Oral Q6H PRN Amin, Sumayya, MD   650 mg at 07/01/24 1832   Or   acetaminophen  (TYLENOL ) suppository 650 mg  650 mg Rectal  Q6H PRN Amin, Sumayya, MD       ceFEPIme (MAXIPIME) 1 g in sodium chloride  0.9 % 100 mL IVPB  1 g Intravenous Q24H Perley Lum DEL, RPH 200 mL/hr at 07/02/24 1728 Infusion Verify at 07/02/24 1728   heparin  injection 5,000 Units  5,000 Units Subcutaneous Q8H Amin, Sumayya, MD   5,000 Units at 07/02/24 2100   midodrine (PROAMATINE) tablet 10 mg  10 mg Oral TID WC Mansy, Jan A, MD   10 mg at 07/03/24 9089   multivitamin (RENA-VIT) tablet 1 tablet  1 tablet Oral QHS Amin, Sumayya, MD   1 tablet at 07/02/24 2055   ondansetron  (ZOFRAN ) tablet 4 mg  4 mg Oral Q6H PRN Amin, Sumayya, MD       Or   ondansetron  (ZOFRAN ) injection 4 mg  4 mg Intravenous Q6H PRN Amin, Sumayya, MD       polyethylene glycol (MIRALAX / GLYCOLAX) packet 17 g  17 g Oral Daily PRN Amin, Sumayya, MD        tamsulosin  (FLOMAX ) capsule 0.4 mg  0.4 mg Oral Daily Amin, Sumayya, MD   0.4 mg at 07/03/24 0910     Abtx:  Anti-infectives (From admission, onward)    Start     Dose/Rate Route Frequency Ordered Stop   07/01/24 1800  ceFEPIme (MAXIPIME) 1 g in sodium chloride  0.9 % 100 mL IVPB        1 g 200 mL/hr over 30 Minutes Intravenous Every 24 hours 06/30/24 1722     06/30/24 2315  vancomycin  (VANCOREADY) IVPB 1500 mg/300 mL        1,500 mg 150 mL/hr over 120 Minutes Intravenous  Once 06/30/24 2229 07/01/24 0113   06/30/24 1730  vancomycin  (VANCOREADY) IVPB 1500 mg/300 mL  Status:  Discontinued        1,500 mg 150 mL/hr over 120 Minutes Intravenous  Once 06/30/24 1721 06/30/24 2229   06/30/24 1722  vancomycin  variable dose per unstable renal function (pharmacist dosing)  Status:  Discontinued         Does not apply See admin instructions 06/30/24 1722 07/01/24 1043   06/30/24 1430  ceFEPIme (MAXIPIME) 2 g in sodium chloride  0.9 % 100 mL IVPB        2 g 200 mL/hr over 30 Minutes Intravenous  Once 06/30/24 1427 06/30/24 1537   06/30/24 1430  metroNIDAZOLE (FLAGYL) IVPB 500 mg        500 mg 100 mL/hr over 60 Minutes Intravenous  Once 06/30/24 1427 06/30/24 1651   06/30/24 1430  vancomycin  (VANCOCIN ) IVPB 1000 mg/200 mL premix        1,000 mg 200 mL/hr over 60 Minutes Intravenous  Once 06/30/24 1427 06/30/24 1934       REVIEW OF SYSTEMS:  Const: fever,  chills, negative weight loss Eyes: negative diplopia or visual changes, negative eye pain ENT: negative coryza, negative sore throat Resp: negative cough, hemoptysis, had dyspnea Cards: negative for chest pain, palpitations, lower extremity edema GU: negative for frequency, dysuria and hematuria GI: Negative for abdominal pain, diarrhea, bleeding, constipation Skin: negative for rash and pruritus Heme: negative for easy bruising and gum/nose bleeding MS:  weakness Neurolo:negative for headaches, dizziness, vertigo, memory problems   Psych: negative for feelings of anxiety, depression  Endocrine: No diabetes Allergy/Immunology-chlorhexidine  Objective:  VITALS:  BP 134/87 (BP Location: Left Arm)   Pulse 80   Temp 98.7 F (37.1 C) (Oral)   Resp 18   Ht 5' 8 (1.727 m)  Wt 121.3 kg   SpO2 98%   BMI 40.66 kg/m   PHYSICAL EXAM:  General: Alert, cooperative, no distress, appears stated age.  Head: Normocephalic, without obvious abnormality, atraumatic. Eyes: Conjunctivae clear, anicteric sclerae. Pupils are equal ENT Nares normal. No drainage or sinus tenderness. Lips, mucosa, and tongue normal. No Thrush Neck: Supple, symmetrical, no adenopathy, thyroid: non tender no carotid bruit and no JVD. Back: No CVA tenderness. Lungs: Clear to auscultation bilaterally. No Wheezing or Rhonchi. No rales. Heart: Regular rate and rhythm, no murmur, rub or gallop. Abdomen: Soft, non-tender,not distended. Bowel sounds normal. No masses Extremities: atraumatic, no cyanosis. No edema. No clubbing Skin: No rashes or lesions. Or bruising Lymph: Cervical, supraclavicular normal. Neurologic: Grossly non-focal Pertinent Labs Lab Results CBC    Component Value Date/Time   WBC 9.8 07/01/2024 0419   RBC 2.65 (L) 07/01/2024 0419   HGB 8.3 (L) 07/01/2024 0419   HGB 14.9 05/06/2024 0936   HCT 24.8 (L) 07/01/2024 0419   HCT 45.4 05/06/2024 0936   PLT 134 (L) 07/01/2024 0419   PLT 316 05/06/2024 0936   MCV 93.6 07/01/2024 0419   MCV 83 05/06/2024 0936   MCH 31.3 07/01/2024 0419   MCHC 33.5 07/01/2024 0419   RDW 18.6 (H) 07/01/2024 0419   RDW 13.4 05/06/2024 0936   LYMPHSABS 1.0 06/30/2024 1337   LYMPHSABS 1.9 05/06/2024 0936   MONOABS 0.9 06/30/2024 1337   EOSABS 0.1 06/30/2024 1337   EOSABS 0.1 05/06/2024 0936   BASOSABS 0.1 06/30/2024 1337   BASOSABS 0.1 05/06/2024 0936       Latest Ref Rng & Units 07/03/2024    8:14 AM 07/01/2024    4:19 AM 06/30/2024    1:37 PM  CMP  Glucose 70 - 99 mg/dL 82  81  92   BUN 6 -  20 mg/dL 58  69  56   Creatinine 0.61 - 1.24 mg/dL 83.85  82.51  84.43   Sodium 135 - 145 mmol/L 137  132  137   Potassium 3.5 - 5.1 mmol/L 3.9  3.8  4.3   Chloride 98 - 111 mmol/L 98  96  94   CO2 22 - 32 mmol/L 22  20  21    Calcium  8.9 - 10.3 mg/dL 6.9  6.6  7.8   Total Protein 6.5 - 8.1 g/dL   7.5   Total Bilirubin 0.0 - 1.2 mg/dL   0.6   Alkaline Phos 38 - 126 U/L   59   AST 15 - 41 U/L   31   ALT 0 - 44 U/L   18       Microbiology: Recent Results (from the past 240 hours)  MRSA Next Gen by PCR, Nasal     Status: None   Collection Time: 06/26/24  3:19 PM   Specimen: Nasal Mucosa; Nasal Swab  Result Value Ref Range Status   MRSA by PCR Next Gen NOT DETECTED NOT DETECTED Final    Comment: (NOTE) The GeneXpert MRSA Assay (FDA approved for NASAL specimens only), is one component of a comprehensive MRSA colonization surveillance program. It is not intended to diagnose MRSA infection nor to guide or monitor treatment for MRSA infections. Test performance is not FDA approved in patients less than 24 years old. Performed at [facility], [address] [place], [place] [address]   Culture, blood (Routine x 2)     Status: Abnormal (Preliminary result)   Collection Time: 06/30/24  1:37 PM   Specimen:  BLOOD  Result Value Ref Range Status   Specimen Description   Final    BLOOD RIGHT ANTECUBITAL Performed at [facility], [address], [place], [place] [address]    Special Requests   Final    BOTTLES DRAWN AEROBIC AND ANAEROBIC Blood Culture adequate volume Performed at [facility], [address] [place], [place] [address]    Culture  Setup Time   Final    GRAM NEGATIVE RODS IN BOTH AEROBIC AND ANAEROBIC BOTTLES CRITICAL RESULT CALLED TO, READ BACK BY AND VERIFIED WITH: MADISON HUNT PHARM.D 06/30/24 1004 KG Performed at [facility], [address] [place], [place] [address]    Culture SERRATIA MARCESCENS (A)  Final   Report  Status PENDING  Incomplete   Organism ID, Bacteria SERRATIA MARCESCENS  Final      Susceptibility   Serratia marcescens - MIC*    CEFEPIME <=0.12 SENSITIVE Sensitive     ERTAPENEM <=0.12 SENSITIVE Sensitive     CEFTRIAXONE  <=0.25 SENSITIVE Sensitive     CIPROFLOXACIN 0.12 SENSITIVE Sensitive     GENTAMICIN <=1 SENSITIVE Sensitive     MEROPENEM <=0.25 SENSITIVE Sensitive     TRIMETH/SULFA <=20 SENSITIVE Sensitive     * SERRATIA MARCESCENS  Blood Culture ID Panel (Reflexed)     Status: Abnormal   Collection Time: 06/30/24  1:37 PM  Result Value Ref Range Status   Enterococcus faecalis NOT DETECTED NOT DETECTED Final   Enterococcus Faecium NOT DETECTED NOT DETECTED Final   Listeria monocytogenes NOT DETECTED NOT DETECTED Final   Staphylococcus species NOT DETECTED NOT DETECTED Final   Staphylococcus aureus (BCID) NOT DETECTED NOT DETECTED Final   Staphylococcus epidermidis NOT DETECTED NOT DETECTED Final   Staphylococcus lugdunensis NOT DETECTED NOT DETECTED Final   Streptococcus species NOT DETECTED NOT DETECTED Final   Streptococcus agalactiae NOT DETECTED NOT DETECTED Final   Streptococcus pneumoniae NOT DETECTED NOT DETECTED Final   Streptococcus pyogenes NOT DETECTED NOT DETECTED Final   A.calcoaceticus-baumannii NOT DETECTED NOT DETECTED Final   Bacteroides fragilis NOT DETECTED NOT DETECTED Final   Enterobacterales DETECTED (A) NOT DETECTED Final    Comment: Enterobacterales represent a large order of gram negative bacteria, not a single organism. CRITICAL RESULT CALLED TO, READ BACK BY AND VERIFIED WITH: MADISON HUNT PHARM.D 07/01/24 1004 KG    Enterobacter cloacae complex NOT DETECTED NOT DETECTED Final   Escherichia coli NOT DETECTED NOT DETECTED Final   Klebsiella aerogenes NOT DETECTED NOT DETECTED Final   Klebsiella oxytoca NOT DETECTED NOT DETECTED Final   Klebsiella pneumoniae NOT DETECTED NOT DETECTED Final   Proteus species NOT DETECTED NOT DETECTED Final    Salmonella species NOT DETECTED NOT DETECTED Final   Serratia marcescens DETECTED (A) NOT DETECTED Final    Comment: CRITICAL RESULT CALLED TO, READ BACK BY AND VERIFIED WITH: MADISON HUNT PHARM.D 07/01/24 1004 KG    Haemophilus influenzae NOT DETECTED NOT DETECTED Final   Neisseria meningitidis NOT DETECTED NOT DETECTED Final   Pseudomonas aeruginosa NOT DETECTED NOT DETECTED Final   Stenotrophomonas maltophilia NOT DETECTED NOT DETECTED Final   Candida albicans NOT DETECTED NOT DETECTED Final   Candida auris NOT DETECTED NOT DETECTED Final   Candida glabrata NOT DETECTED NOT DETECTED Final   Candida krusei NOT DETECTED NOT DETECTED Final   Candida parapsilosis NOT DETECTED NOT DETECTED Final   Candida tropicalis NOT DETECTED NOT DETECTED Final   Cryptococcus neoformans/gattii NOT DETECTED NOT DETECTED Final   CTX-M ESBL NOT DETECTED NOT  DETECTED Final   Carbapenem resistance IMP NOT DETECTED NOT DETECTED Final   Carbapenem resistance KPC NOT DETECTED NOT DETECTED Final   Carbapenem resistance NDM NOT DETECTED NOT DETECTED Final   Carbapenem resist OXA 48 LIKE NOT DETECTED NOT DETECTED Final   Carbapenem resistance VIM NOT DETECTED NOT DETECTED Final    Comment: Performed at [facility], [address] [place], [place] [address]  Culture, blood (Routine x 2)     Status: Abnormal (Preliminary result)   Collection Time: 06/30/24  2:59 PM   Specimen: BLOOD RIGHT FOREARM  Result Value Ref Range Status   Specimen Description   Final    BLOOD RIGHT FOREARM Performed at [facility], [address], [place], [place] [address]    Special Requests   Final    BOTTLES DRAWN AEROBIC AND ANAEROBIC Blood Culture adequate volume Performed at [facility], [address] [place], [place] [address]    Culture  Setup Time   Final    GRAM NEGATIVE RODS IN BOTH AEROBIC AND ANAEROBIC BOTTLES CRITICAL VALUE NOTED.  VALUE IS CONSISTENT WITH PREVIOUSLY REPORTED AND CALLED  VALUE. Performed at [facility], [address] [place], [place] [address]    Culture (A)  Final    SERRATIA MARCESCENS SUSCEPTIBILITIES PERFORMED ON PREVIOUS CULTURE WITHIN THE LAST 5 DAYS. Performed at [facility], [address] [place], [place] [address]    Report Status PENDING  Incomplete  Resp panel by RT-PCR (RSV, Flu A&B, Covid) Anterior Nasal Swab     Status: None   Collection Time: 06/30/24  3:39 PM   Specimen: Anterior Nasal Swab  Result Value Ref Range Status   SARS Coronavirus 2 by RT PCR NEGATIVE NEGATIVE Final    Comment: (NOTE) SARS-CoV-2 target nucleic acids are NOT DETECTED.  The SARS-CoV-2 RNA is generally detectable in upper respiratory specimens during the acute phase of infection. The lowest concentration of SARS-CoV-2 viral copies this assay can detect is 138 copies/mL. A negative result does not preclude SARS-Cov-2 infection and should not be used as the sole basis for treatment or other patient management decisions. A negative result may occur with  improper specimen collection/handling, submission of specimen other than nasopharyngeal swab, presence of viral mutation(s) within the areas targeted by this assay, and inadequate number of viral copies(<138 copies/mL). A negative result must be combined with clinical observations, patient history, and epidemiological information. The expected result is Negative.  Fact Sheet for Patients:  bloggercourse.com  Fact Sheet for Healthcare Providers:  seriousbroker.it  This test is no t yet approved or cleared by the [place]  FDA and  has been authorized for detection and/or diagnosis of SARS-CoV-2 by FDA under an Emergency Use Authorization (EUA). This EUA will remain  in effect (meaning this test can be used) for the duration of the COVID-19 declaration under Section 564(b)(1) of the Act, 21 U.S.C.section 360bbb-3(b)(1), unless the  authorization is terminated  or revoked sooner.       Influenza A by PCR NEGATIVE NEGATIVE Final   Influenza B by PCR NEGATIVE NEGATIVE Final    Comment: (NOTE) The Xpert Xpress SARS-CoV-2/FLU/RSV plus assay is intended as an aid in the diagnosis of influenza from Nasopharyngeal swab specimens and should not be used as a sole basis for treatment. Nasal washings and aspirates are unacceptable for Xpert Xpress SARS-CoV-2/FLU/RSV testing.  Fact Sheet for Patients: bloggercourse.com  Fact Sheet for Healthcare Providers: seriousbroker.it  This test is not yet approved  or cleared by the [place]  FDA and has been authorized for detection and/or diagnosis of SARS-CoV-2 by FDA under an Emergency Use Authorization (EUA). This EUA will remain in effect (meaning this test can be used) for the duration of the COVID-19 declaration under Section 564(b)(1) of the Act, 21 U.S.C. section 360bbb-3(b)(1), unless the authorization is terminated or revoked.     Resp Syncytial Virus by PCR NEGATIVE NEGATIVE Final    Comment: (NOTE) Fact Sheet for Patients: bloggercourse.com  Fact Sheet for Healthcare Providers: seriousbroker.it  This test is not yet approved or cleared by the [place]  FDA and has been authorized for detection and/or diagnosis of SARS-CoV-2 by FDA under an Emergency Use Authorization (EUA). This EUA will remain in effect (meaning this test can be used) for the duration of the COVID-19 declaration under Section 564(b)(1) of the Act, 21 U.S.C. section 360bbb-3(b)(1), unless the authorization is terminated or revoked.  Performed at [facility], [address] [place], [place] [address]   Respiratory (~20 pathogens) panel by PCR     Status: None   Collection Time: 07/01/24  7:46 AM   Specimen: Nasopharyngeal Swab; Respiratory  Result Value Ref Range Status    Adenovirus NOT DETECTED NOT DETECTED Final   Coronavirus 229E NOT DETECTED NOT DETECTED Final    Comment: (NOTE) The Coronavirus on the Respiratory Panel, DOES NOT test for the novel  Coronavirus (2019 nCoV)    Coronavirus HKU1 NOT DETECTED NOT DETECTED Final   Coronavirus NL63 NOT DETECTED NOT DETECTED Final   Coronavirus OC43 NOT DETECTED NOT DETECTED Final   Metapneumovirus NOT DETECTED NOT DETECTED Final   Rhinovirus / Enterovirus NOT DETECTED NOT DETECTED Final   Influenza A NOT DETECTED NOT DETECTED Final   Influenza B NOT DETECTED NOT DETECTED Final   Parainfluenza Virus 1 NOT DETECTED NOT DETECTED Final   Parainfluenza Virus 2 NOT DETECTED NOT DETECTED Final   Parainfluenza Virus 3 NOT DETECTED NOT DETECTED Final   Parainfluenza Virus 4 NOT DETECTED NOT DETECTED Final   Respiratory Syncytial Virus NOT DETECTED NOT DETECTED Final   Bordetella pertussis NOT DETECTED NOT DETECTED Final   Bordetella Parapertussis NOT DETECTED NOT DETECTED Final   Chlamydophila pneumoniae NOT DETECTED NOT DETECTED Final   Mycoplasma pneumoniae NOT DETECTED NOT DETECTED Final    Comment: Performed at [facility], [address], [place], [place] [address]  MRSA Next Gen by PCR, Nasal     Status: None   Collection Time: 07/01/24  7:46 AM   Specimen: Nasal Mucosa; Nasal Swab  Result Value Ref Range Status   MRSA by PCR Next Gen NOT DETECTED NOT DETECTED Final    Comment: (NOTE) The GeneXpert MRSA Assay (FDA approved for NASAL specimens only), is one component of a comprehensive MRSA colonization surveillance program. It is not intended to diagnose MRSA infection nor to guide or monitor treatment for MRSA infections. Test performance is not FDA approved in patients less than 43 years old. Performed at [facility], [address] [place], [place] [address]   Cath Tip Culture     Status: Abnormal (Preliminary result)   Collection Time: 07/01/24  5:56 PM   Specimen: Catheter Tip;  Other  Result Value Ref Range Status   Specimen Description   Final    CATH TIP Performed at [facility], [address], [place], [place] [address]    Special Requests   Final    NONE Performed at [facility], [phone]  [address] [place], [place] [address]    Culture (A)  Final    10,000 COLONIES/mL SERRATIA MARCESCENS SUSCEPTIBILITIES TO FOLLOW Performed at [facility], [address] [place], [place] [address]    Report Status PENDING  Incomplete    Lines and Device Date on insertion # of days DC  Central line     Foley     ETT       IMAGING RESULTS: I have personally reviewed the films ? Impression/Recommendation Sepsis with Serratia bacteremia secondary to hemodialysis catheter related infection.  The catheter was placed on 06/26/2024 but prior to it he had another dialysis catheter and this new catheter was exchanged over a guidewire The catheter has been removed and the tip also has Serratia  pansensitive Serratia Patient is currently on cefepime Continue the same as it is easy to give  during dialysis Will get a 2D echo We need to treat this with at least  4 weeks of antibiotics as there is concern for endovascular infection from the old catheter  Hypertension.  Patient has been having low BP after dialysis and is on midodrine.  End-stage renal disease on dialysis  Anemia secondary to CKD Autism  BPH on tamsulosin    This consult involves complex antimicrobial management ? I have personally spent  --75-minutes involved in face-to-face and non-face-to-face activities for this patient on the day of the visit. Professional time spent includes the following activities: Preparing to see the patient (review of tests), Obtaining and/or reviewing separately obtained history (admission/discharge record), Performing a medically appropriate examination and/or evaluation , Ordering medications/tests/procedures, referring and communicating with other  health care professionals, Documenting clinical information in the EMR, Independently interpreting results (not separately reported), Communicating results to the patient/, Counseling and educating the patient/ and Care coordination with ID pharmacist  ________________________________________________  Note:  This document was prepared using Dragon voice recognition software and may include unintentional dictation errors.

## 2024-07-03 NOTE — Progress Notes (Signed)
 Progress Note   Patient: Joseph Gilmore FMW:[id number] DOB: 24-Mar-1971 DOA: 06/30/2024     3 DOS: the patient was seen and examined on 07/03/2024   Brief hospital course: SHEMAR PLEMMONS II is a 53 y.o. male with medical history significant of ESRD on TTS dialysis schedule, autistic with history of hypertension but not on any medication now came to ED with concern of cough and shortness of breath started yesterday after dialysis.   Patient had his HD catheter exchange by vascular surgery on 10/29, last dialysis was on Saturday.   On presentation patient was found to be febrile at 103.2, tachycardic at 117, tachypneic 22 and borderline soft blood pressure at 90/54, CBC with neutrophilic predominant leukocytosis at 12.6, renal function consistent with ESRD, lactic acid normal.  Troponin 122, respiratory panel negative for COVID, influenza and RSV. CXR with no acute abnormality. EKG with sinus tachycardia and nonspecific ST changes.   ED provider discussed the case with his vascular surgeon and they advised continue broad-spectrum antibiotics and obtain peripheral cultures.  HD catheter site without any obvious sign of infection.  11/3: Afebrile this morning.  Blood cultures growing Serratia marcescens.  Repeat chest x-ray this morning remain negative for any acute abnormality.  Concern of catheter acquired infection, apparently it was a difficult insertion after multiple attempts.  Case was discussed with nephrology and vascular surgery and plan is to remove catheter after dialysis today to give him a holiday.  They will reinsert another catheter in 1 to 2 days to continue with dialysis.  Vancomycin  was discontinued.  11/4: HD catheter was removed yesterday by vascular surgery, preliminary catheter tip cultures negative.  Nephrology will give 2 days of catheter holiday and then they will place another HD catheter to continue dialysis.  Susceptibility results from blood cultures are pending.  11/5:  Hemodynamically stable.  Catheter tip culture with similar Serratia marcescens-pansensitive. Repeat blood cultures ordered.  Nephrology will wait for negative blood cultures before replacing a HD catheter.  Assessment and Plan: * Sepsis [facility]) Patient met sepsis criteria with tachycardia, tachypnea, fever and leukocytosis. respiratory panel negative for COVID, influenza and RSV.   Chest x-ray remained negative.  RVP negative Blood cultures growing pansensitive Serratia marcescens-catheter tip with a similar bacteria MRSA PCR negative.  Vancomycin  was discontinued. Repeat blood cultures done on 11/5 HD catheter was removed by vascular surgery on 10/3, nephrology is planning to replace after repeat blood cultures are negative -Continue with cefepime -Follow-up repeat blood culture results  ESRD on dialysis [facility]) Patient is on TTS schedule, patient had dialysis on Monday and now getting couple of days of holiday. - Monitor renal function - Nephrology is on board  Elevated troponin Patient denies any chest pain but having some shortness of breath.  Troponin elevated at 122>>68.  EKG with sinus tachycardia and nonspecific ST changes.  Likely secondary to demand ischemia with sepsis. -Continue to monitor  Hypertension Blood pressure currently within goal Not on any antihypertensives at home ,uses midodrine on the day of dialysis. -Continue with 3 times daily midodrine for now - Continue to monitor  BPH (benign prostatic hyperplasia) - Continue Flomax   Obesity, Class III, BMI 40-49.9 (morbid obesity) (HCC) Estimated body mass index is 40.22 kg/m as calculated from the following:   Height as of this encounter: 5' 8 (1.727 m).   Weight as of this encounter: 120 kg.   - This will complicate overall prognosis -Encouraged weight loss   Subjective: Patient was seen and examined  today.  No new concern.  Physical Exam: Vitals:   07/02/24 1940 07/03/24 0454 07/03/24 0751 07/03/24 0751   BP: 122/71 120/76 134/87 134/87  Pulse: 81 80 79 80  Resp: 17 18 18 18   Temp: 98.4 F (36.9 C) 99.1 F (37.3 C) 98.7 F (37.1 C) 98.7 F (37.1 C)  TempSrc: Oral Oral Oral Oral  SpO2: 98% 98% 98% 98%  Weight:      Height:       General.  Morbidly obese, autistic gentleman, in no acute distress. Pulmonary.  Lungs clear bilaterally, normal respiratory effort. CV.  Regular rate and rhythm, no JVD, rub or murmur. Abdomen.  Soft, nontender, nondistended, BS positive. CNS.  Alert and oriented .  No focal neurologic deficit. Extremities.  No edema, no cyanosis, pulses intact and symmetrical. Psychiatry.  Judgment and insight appears normal.   Data Reviewed: Prior data reviewed  Family Communication: Discussed with patient  Disposition: Status is: Inpatient Remains inpatient appropriate because: Severity of illness  Planned Discharge Destination: Home  DVT prophylaxis.  Subcu heparin  Time spent: 50 minutes  This record has been created using Conservation officer, historic buildings. Errors have been sought and corrected,but may not always be located. Such creation errors do not reflect on the standard of care.   Author: Amaryllis Dare, MD 07/03/2024 3:01 PM  For on call review [email or web address].

## 2024-07-03 NOTE — Plan of Care (Signed)
  Problem: Pain Managment: Goal: General experience of comfort will improve and/or be controlled Outcome: Progressing   Problem: Safety: Goal: Ability to remain free from injury will improve Outcome: Progressing

## 2024-07-03 NOTE — Plan of Care (Signed)
  Problem: Clinical Measurements: Goal: Ability to maintain clinical measurements within normal limits will improve Outcome: Progressing   Problem: Clinical Measurements: Goal: Will remain free from infection Outcome: Progressing   Problem: Clinical Measurements: Goal: Will remain free from infection Outcome: Progressing   Problem: Activity: Goal: Risk for activity intolerance will decrease Outcome: Progressing   Problem: Nutrition: Goal: Adequate nutrition will be maintained Outcome: Progressing   Problem: Clinical Measurements: Goal: Diagnostic test results will improve Outcome: Progressing

## 2024-07-03 NOTE — Progress Notes (Signed)
 [facility]  ROUNDING NOTE   Subjective:   Joseph Gilmore is a 53 y.o. male with past medical history of autism, hypertension, OCD, and end stage renal disease on hemodialysis. Patient presents to ED with shortness of breath and has been admitted for Shortness of breath [R06.02] Sepsis (HCC) [A41.9] Fever, unspecified fever cause [R50.9]  Patient is known to our practice and receives outpatient dialysis treatments at [facility] on a TTS schedule, supervised by Dr Douglas.    Update: Patient sitting at side of bed Alert No family present Room air  Objective:  Vital signs in last 24 hours:  Temp:  [98.2 F (36.8 C)-99.1 F (37.3 C)] 98.7 F (37.1 C) (11/05 0751) Pulse Rate:  [79-85] 80 (11/05 0751) Resp:  [17-18] 18 (11/05 0751) BP: (111-134)/(56-87) 134/87 (11/05 0751) SpO2:  [98 %] 98 % (11/05 0751)  Weight change:  Filed Weights   06/30/24 1301 07/01/24 1320 07/01/24 1708  Weight: 120 kg 122.9 kg 121.3 kg    Intake/Output: I/O last 3 completed shifts: In: 43.4 [IV Piggyback:43.4] Out: -    Intake/Output this shift:  Total I/O In: 200 [P.O.:200] Out: -   Physical Exam: General: NAD  Head: Normocephalic, atraumatic. Moist oral mucosal membranes  Eyes: Anicteric  Lungs:  Diminished, normal effort  Heart: Regular rate and rhythm  Abdomen:  Soft, nontender  Extremities:  Trace peripheral edema.  Neurologic: Awake, alert, conversant  Skin: Warm,dry, no rash  Access: Removed on 11/3    Basic Metabolic Panel: Recent Labs  Lab 06/26/24 1541 06/30/24 1337 07/01/24 0419 07/03/24 0814  NA 142 137 132* 137  K 3.2* 4.3 3.8 3.9  CL 99 94* 96* 98  CO2 16* 21* 20* 22  GLUCOSE 252* 92 81 82  BUN 39* 56* 69* 58*  CREATININE 15.98* 15.56* 17.48* 16.14*  CALCIUM  7.5* 7.8* 6.6* 6.9*  MG 2.4  --   --   --   PHOS  --   --   --  5.1*    Liver Function Tests: Recent Labs  Lab 06/26/24 1541 06/30/24 1337 07/03/24 0814  AST 92* 31  --   ALT  99* 18  --   ALKPHOS 51 59  --   BILITOT 0.7 0.6  --   PROT 6.0* 7.5  --   ALBUMIN 3.1* 3.1* 2.6*   No results for input(s): LIPASE, AMYLASE in the last 168 hours. No results for input(s): AMMONIA in the last 168 hours.  CBC: Recent Labs  Lab 06/26/24 1541 06/30/24 1337 07/01/24 0419  WBC 15.4* 12.6* 9.8  NEUTROABS  --  10.3*  --   HGB 9.3* 10.2* 8.3*  HCT 29.4* 31.7* 24.8*  MCV 100.0 96.4 93.6  PLT 232 150 134*    Cardiac Enzymes: No results for input(s): CKTOTAL, CKMB, CKMBINDEX, TROPONINI in the last 168 hours.  BNP: Invalid input(s): POCBNP  CBG: Recent Labs  Lab 06/26/24 1516 06/26/24 1638 06/26/24 2228 06/27/24 0731 06/27/24 1305  GLUCAP 233* 223* 117* 118* 108*    Microbiology: Results for orders placed or performed during the hospital encounter of 06/30/24  Culture, blood (Routine x 2)     Status: Abnormal (Preliminary result)   Collection Time: 06/30/24  1:37 PM   Specimen: BLOOD  Result Value Ref Range Status   Specimen Description   Final    BLOOD RIGHT ANTECUBITAL Performed at [facility], [address], [place], [place] [address]    Special Requests   Final  BOTTLES DRAWN AEROBIC AND ANAEROBIC Blood Culture adequate volume Performed at [facility], [address] [place], [place] [address]    Culture  Setup Time   Final    GRAM NEGATIVE RODS IN BOTH AEROBIC AND ANAEROBIC BOTTLES CRITICAL RESULT CALLED TO, READ BACK BY AND VERIFIED WITH: MADISON HUNT PHARM.D 06/30/24 1004 KG Performed at [facility], [address] [place], [place] [address]    Culture SERRATIA MARCESCENS (A)  Final   Report Status PENDING  Incomplete   Organism ID, Bacteria SERRATIA MARCESCENS  Final      Susceptibility   Serratia marcescens - MIC*    CEFEPIME <=0.12 SENSITIVE Sensitive     ERTAPENEM <=0.12 SENSITIVE Sensitive     CEFTRIAXONE  <=0.25 SENSITIVE Sensitive     CIPROFLOXACIN 0.12 SENSITIVE Sensitive      GENTAMICIN <=1 SENSITIVE Sensitive     MEROPENEM <=0.25 SENSITIVE Sensitive     TRIMETH/SULFA <=20 SENSITIVE Sensitive     * SERRATIA MARCESCENS  Blood Culture ID Panel (Reflexed)     Status: Abnormal   Collection Time: 06/30/24  1:37 PM  Result Value Ref Range Status   Enterococcus faecalis NOT DETECTED NOT DETECTED Final   Enterococcus Faecium NOT DETECTED NOT DETECTED Final   Listeria monocytogenes NOT DETECTED NOT DETECTED Final   Staphylococcus species NOT DETECTED NOT DETECTED Final   Staphylococcus aureus (BCID) NOT DETECTED NOT DETECTED Final   Staphylococcus epidermidis NOT DETECTED NOT DETECTED Final   Staphylococcus lugdunensis NOT DETECTED NOT DETECTED Final   Streptococcus species NOT DETECTED NOT DETECTED Final   Streptococcus agalactiae NOT DETECTED NOT DETECTED Final   Streptococcus pneumoniae NOT DETECTED NOT DETECTED Final   Streptococcus pyogenes NOT DETECTED NOT DETECTED Final   A.calcoaceticus-baumannii NOT DETECTED NOT DETECTED Final   Bacteroides fragilis NOT DETECTED NOT DETECTED Final   Enterobacterales DETECTED (A) NOT DETECTED Final    Comment: Enterobacterales represent a large order of gram negative bacteria, not a single organism. CRITICAL RESULT CALLED TO, READ BACK BY AND VERIFIED WITH: MADISON HUNT PHARM.D 07/01/24 1004 KG    Enterobacter cloacae complex NOT DETECTED NOT DETECTED Final   Escherichia coli NOT DETECTED NOT DETECTED Final   Klebsiella aerogenes NOT DETECTED NOT DETECTED Final   Klebsiella oxytoca NOT DETECTED NOT DETECTED Final   Klebsiella pneumoniae NOT DETECTED NOT DETECTED Final   Proteus species NOT DETECTED NOT DETECTED Final   Salmonella species NOT DETECTED NOT DETECTED Final   Serratia marcescens DETECTED (A) NOT DETECTED Final    Comment: CRITICAL RESULT CALLED TO, READ BACK BY AND VERIFIED WITH: MADISON HUNT PHARM.D 07/01/24 1004 KG    Haemophilus influenzae NOT DETECTED NOT DETECTED Final   Neisseria meningitidis NOT  DETECTED NOT DETECTED Final   Pseudomonas aeruginosa NOT DETECTED NOT DETECTED Final   Stenotrophomonas maltophilia NOT DETECTED NOT DETECTED Final   Candida albicans NOT DETECTED NOT DETECTED Final   Candida auris NOT DETECTED NOT DETECTED Final   Candida glabrata NOT DETECTED NOT DETECTED Final   Candida krusei NOT DETECTED NOT DETECTED Final   Candida parapsilosis NOT DETECTED NOT DETECTED Final   Candida tropicalis NOT DETECTED NOT DETECTED Final   Cryptococcus neoformans/gattii NOT DETECTED NOT DETECTED Final   CTX-M ESBL NOT DETECTED NOT DETECTED Final   Carbapenem resistance IMP NOT DETECTED NOT DETECTED Final   Carbapenem resistance KPC NOT DETECTED NOT DETECTED Final   Carbapenem resistance NDM NOT DETECTED NOT DETECTED Final   Carbapenem resist OXA 48 LIKE NOT DETECTED NOT DETECTED  Final   Carbapenem resistance VIM NOT DETECTED NOT DETECTED Final    Comment: Performed at [facility], [address] [place], [place] [address]  Culture, blood (Routine x 2)     Status: Abnormal (Preliminary result)   Collection Time: 06/30/24  2:59 PM   Specimen: BLOOD RIGHT FOREARM  Result Value Ref Range Status   Specimen Description   Final    BLOOD RIGHT FOREARM Performed at [facility], [address], [place], [place] [address]    Special Requests   Final    BOTTLES DRAWN AEROBIC AND ANAEROBIC Blood Culture adequate volume Performed at [facility], [address] [place], [place] [address]    Culture  Setup Time   Final    GRAM NEGATIVE RODS IN BOTH AEROBIC AND ANAEROBIC BOTTLES CRITICAL VALUE NOTED.  VALUE IS CONSISTENT WITH PREVIOUSLY REPORTED AND CALLED VALUE. Performed at [facility], [address] [place], [place] [address]    Culture (A)  Final    SERRATIA MARCESCENS SUSCEPTIBILITIES PERFORMED ON PREVIOUS CULTURE WITHIN THE LAST 5 DAYS. Performed at [facility], [address], [place], [place] [address]    Report Status  PENDING  Incomplete  Resp panel by RT-PCR (RSV, Flu A&B, Covid) Anterior Nasal Swab     Status: None   Collection Time: 06/30/24  3:39 PM   Specimen: Anterior Nasal Swab  Result Value Ref Range Status   SARS Coronavirus 2 by RT PCR NEGATIVE NEGATIVE Final    Comment: (NOTE) SARS-CoV-2 target nucleic acids are NOT DETECTED.  The SARS-CoV-2 RNA is generally detectable in upper respiratory specimens during the acute phase of infection. The lowest concentration of SARS-CoV-2 viral copies this assay can detect is 138 copies/mL. A negative result does not preclude SARS-Cov-2 infection and should not be used as the sole basis for treatment or other patient management decisions. A negative result may occur with  improper specimen collection/handling, submission of specimen other than nasopharyngeal swab, presence of viral mutation(s) within the areas targeted by this assay, and inadequate number of viral copies(<138 copies/mL). A negative result must be combined with clinical observations, patient history, and epidemiological information. The expected result is Negative.  Fact Sheet for Patients:  bloggercourse.com  Fact Sheet for Healthcare Providers:  seriousbroker.it  This test is no t yet approved or cleared by the [place]  FDA and  has been authorized for detection and/or diagnosis of SARS-CoV-2 by FDA under an Emergency Use Authorization (EUA). This EUA will remain  in effect (meaning this test can be used) for the duration of the COVID-19 declaration under Section 564(b)(1) of the Act, 21 U.S.C.section 360bbb-3(b)(1), unless the authorization is terminated  or revoked sooner.       Influenza A by PCR NEGATIVE NEGATIVE Final   Influenza B by PCR NEGATIVE NEGATIVE Final    Comment: (NOTE) The Xpert Xpress SARS-CoV-2/FLU/RSV plus assay is intended as an aid in the diagnosis of influenza from Nasopharyngeal swab specimens  and should not be used as a sole basis for treatment. Nasal washings and aspirates are unacceptable for Xpert Xpress SARS-CoV-2/FLU/RSV testing.  Fact Sheet for Patients: bloggercourse.com  Fact Sheet for Healthcare Providers: seriousbroker.it  This test is not yet approved or cleared by the [place]  FDA and has been authorized for detection and/or diagnosis of SARS-CoV-2 by FDA under an Emergency Use Authorization (EUA). This EUA will remain in effect (meaning this test can be used) for the duration of the COVID-19  declaration under Section 564(b)(1) of the Act, 21 U.S.C. section 360bbb-3(b)(1), unless the authorization is terminated or revoked.     Resp Syncytial Virus by PCR NEGATIVE NEGATIVE Final    Comment: (NOTE) Fact Sheet for Patients: bloggercourse.com  Fact Sheet for Healthcare Providers: seriousbroker.it  This test is not yet approved or cleared by the [place]  FDA and has been authorized for detection and/or diagnosis of SARS-CoV-2 by FDA under an Emergency Use Authorization (EUA). This EUA will remain in effect (meaning this test can be used) for the duration of the COVID-19 declaration under Section 564(b)(1) of the Act, 21 U.S.C. section 360bbb-3(b)(1), unless the authorization is terminated or revoked.  Performed at [facility], [address] [place], [place] [address]   Respiratory (~20 pathogens) panel by PCR     Status: None   Collection Time: 07/01/24  7:46 AM   Specimen: Nasopharyngeal Swab; Respiratory  Result Value Ref Range Status   Adenovirus NOT DETECTED NOT DETECTED Final   Coronavirus 229E NOT DETECTED NOT DETECTED Final    Comment: (NOTE) The Coronavirus on the Respiratory Panel, DOES NOT test for the novel  Coronavirus (2019 nCoV)    Coronavirus HKU1 NOT DETECTED NOT DETECTED Final   Coronavirus NL63 NOT DETECTED NOT  DETECTED Final   Coronavirus OC43 NOT DETECTED NOT DETECTED Final   Metapneumovirus NOT DETECTED NOT DETECTED Final   Rhinovirus / Enterovirus NOT DETECTED NOT DETECTED Final   Influenza A NOT DETECTED NOT DETECTED Final   Influenza B NOT DETECTED NOT DETECTED Final   Parainfluenza Virus 1 NOT DETECTED NOT DETECTED Final   Parainfluenza Virus 2 NOT DETECTED NOT DETECTED Final   Parainfluenza Virus 3 NOT DETECTED NOT DETECTED Final   Parainfluenza Virus 4 NOT DETECTED NOT DETECTED Final   Respiratory Syncytial Virus NOT DETECTED NOT DETECTED Final   Bordetella pertussis NOT DETECTED NOT DETECTED Final   Bordetella Parapertussis NOT DETECTED NOT DETECTED Final   Chlamydophila pneumoniae NOT DETECTED NOT DETECTED Final   Mycoplasma pneumoniae NOT DETECTED NOT DETECTED Final    Comment: Performed at [facility], [address], [place], [place] [address]  MRSA Next Gen by PCR, Nasal     Status: None   Collection Time: 07/01/24  7:46 AM   Specimen: Nasal Mucosa; Nasal Swab  Result Value Ref Range Status   MRSA by PCR Next Gen NOT DETECTED NOT DETECTED Final    Comment: (NOTE) The GeneXpert MRSA Assay (FDA approved for NASAL specimens only), is one component of a comprehensive MRSA colonization surveillance program. It is not intended to diagnose MRSA infection nor to guide or monitor treatment for MRSA infections. Test performance is not FDA approved in patients less than 2 years old. Performed at [facility], [address] [place], [place] [address]   Cath Tip Culture     Status: Abnormal (Preliminary result)   Collection Time: 07/01/24  5:56 PM   Specimen: Catheter Tip; Other  Result Value Ref Range Status   Specimen Description   Final    CATH TIP Performed at [facility], [address] [place], [place] [address]    Special Requests   Final    NONE Performed at [facility], [address] [place], [place] [address]    Culture (A)   Final    10,000 COLONIES/mL SERRATIA MARCESCENS SUSCEPTIBILITIES TO FOLLOW Performed at [facility], [address] [place], [place] [address]    Report Status PENDING  Incomplete    Coagulation Studies: Recent Labs    06/30/24 1337  LABPROT 16.9*  INR 1.3*    Urinalysis: No results for input(s): COLORURINE, LABSPEC, PHURINE, GLUCOSEU, HGBUR, BILIRUBINUR, KETONESUR, PROTEINUR, UROBILINOGEN, NITRITE, LEUKOCYTESUR in the last 72 hours.  Invalid input(s): APPERANCEUR    Imaging: PERIPHERAL VASCULAR CATHETERIZATION Result Date: 07/01/2024 See surgical note for result.    Medications:    ceFEPime (MAXIPIME) IV 200 mL/hr at 07/02/24 1728    heparin   5,000 Units Subcutaneous Q8H   midodrine  10 mg Oral TID WC   multivitamin  1 tablet Oral QHS   tamsulosin   0.4 mg Oral Daily   acetaminophen  **OR** acetaminophen , ondansetron  **OR** ondansetron  (ZOFRAN ) IV, polyethylene glycol  Assessment/ Plan:  Joseph Gilmore is a 53 y.o.  male with past medical history of autism, hypertension, OCD, and end stage renal disease on hemodialysis. Patient presented for scheduled permcath exchange with vascular surgery. After procedure, patient became agitated with intermittent somnolence. Required Narcan without sufficient response. He was admitted for observation.   End stage renal disease on hemodialysis.  Last dialysis treatment received on 11/3, prior to permcath removal. Labs and respiratory status stable. Will continue to monitor daily. Will attempt to remain line free. Latest blood cultures drawn 11/5.  Anemia of chronic kidney disease  Lab Results  Component Value Date   HGB 8.3 (L) 07/01/2024   Hgb decreased. Will order Retacrit  10000 units sub Q today.   3. 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 Most recent hemoglobin A1c is 5.2 on 06/26/24.   Diet controlled   LOS: 3 Joseph Gilmore 11/5/202512:43 PM

## 2024-07-03 NOTE — Plan of Care (Signed)
   Problem: Health Behavior/Discharge Planning: Goal: Ability to manage health-related needs will improve Outcome: Progressing

## 2024-07-04 ENCOUNTER — Inpatient Hospital Stay
Admit: 2024-07-04 | Discharge: 2024-07-04 | Disposition: A | Attending: Infectious Diseases | Admitting: Infectious Diseases

## 2024-07-04 DIAGNOSIS — A498 Other bacterial infections of unspecified site: Secondary | ICD-10-CM

## 2024-07-04 DIAGNOSIS — I120 Hypertensive chronic kidney disease with stage 5 chronic kidney disease or end stage renal disease: Secondary | ICD-10-CM | POA: Diagnosis not present

## 2024-07-04 DIAGNOSIS — R7989 Other specified abnormal findings of blood chemistry: Secondary | ICD-10-CM | POA: Diagnosis not present

## 2024-07-04 DIAGNOSIS — R7881 Bacteremia: Secondary | ICD-10-CM

## 2024-07-04 DIAGNOSIS — A419 Sepsis, unspecified organism: Secondary | ICD-10-CM | POA: Diagnosis not present

## 2024-07-04 DIAGNOSIS — I10 Essential (primary) hypertension: Secondary | ICD-10-CM | POA: Diagnosis not present

## 2024-07-04 DIAGNOSIS — N186 End stage renal disease: Secondary | ICD-10-CM | POA: Diagnosis not present

## 2024-07-04 DIAGNOSIS — A4153 Sepsis due to Serratia: Secondary | ICD-10-CM | POA: Diagnosis not present

## 2024-07-04 DIAGNOSIS — T827XXA Infection and inflammatory reaction due to other cardiac and vascular devices, implants and grafts, initial encounter: Secondary | ICD-10-CM | POA: Diagnosis not present

## 2024-07-04 LAB — CULTURE, BLOOD (ROUTINE X 2)
Special Requests: ADEQUATE
Special Requests: ADEQUATE

## 2024-07-04 LAB — CBC
HCT: 25 % — ABNORMAL LOW (ref 39.0–52.0)
Hemoglobin: 8.4 g/dL — ABNORMAL LOW (ref 13.0–17.0)
MCH: 31.3 pg (ref 26.0–34.0)
MCHC: 33.6 g/dL (ref 30.0–36.0)
MCV: 93.3 fL (ref 80.0–100.0)
Platelets: 184 K/uL (ref 150–400)
RBC: 2.68 MIL/uL — ABNORMAL LOW (ref 4.22–5.81)
RDW: 18.2 % — ABNORMAL HIGH (ref 11.5–15.5)
WBC: 9.8 K/uL (ref 4.0–10.5)
nRBC: 0 % (ref 0.0–0.2)

## 2024-07-04 LAB — RENAL FUNCTION PANEL
Albumin: 2.5 g/dL — ABNORMAL LOW (ref 3.5–5.0)
Anion gap: 17 — ABNORMAL HIGH (ref 5–15)
BUN: 65 mg/dL — ABNORMAL HIGH (ref 6–20)
CO2: 22 mmol/L (ref 22–32)
Calcium: 6.7 mg/dL — ABNORMAL LOW (ref 8.9–10.3)
Chloride: 100 mmol/L (ref 98–111)
Creatinine, Ser: 18.29 mg/dL — ABNORMAL HIGH (ref 0.61–1.24)
GFR, Estimated: 3 mL/min — ABNORMAL LOW (ref >60)
Glucose, Bld: 82 mg/dL (ref 70–99)
Phosphorus: 6.2 mg/dL — ABNORMAL HIGH (ref 2.5–4.6)
Potassium: 4.3 mmol/L (ref 3.5–5.1)
Sodium: 139 mmol/L (ref 135–145)

## 2024-07-04 LAB — HEPATITIS B SURFACE ANTIGEN: Hepatitis B Surface Ag: NONREACTIVE

## 2024-07-04 LAB — ECHOCARDIOGRAM COMPLETE
Height: 68 in
S' Lateral: 2.24 cm
Weight: 4278.69 [oz_av]

## 2024-07-04 LAB — CATH TIP CULTURE: Culture: 10000 — AB

## 2024-07-04 NOTE — Progress Notes (Signed)
 Progress Note   Patient: Joseph Gilmore FMW:[id number] DOB: 1971-08-04 DOA: 06/30/2024     4 DOS: the patient was seen and examined on 07/04/2024   Brief hospital course: CEDRIK HEINDL II is a 53 y.o. male with medical history significant of ESRD on TTS dialysis schedule, autistic with history of hypertension but not on any medication now came to ED with concern of cough and shortness of breath started yesterday after dialysis.   Patient had his HD catheter exchange by vascular surgery on 10/29, last dialysis was on Saturday.   On presentation patient was found to be febrile at 103.2, tachycardic at 117, tachypneic 22 and borderline soft blood pressure at 90/54, CBC with neutrophilic predominant leukocytosis at 12.6, renal function consistent with ESRD, lactic acid normal.  Troponin 122, respiratory panel negative for COVID, influenza and RSV. CXR with no acute abnormality. EKG with sinus tachycardia and nonspecific ST changes.   ED provider discussed the case with his vascular surgeon and they advised continue broad-spectrum antibiotics and obtain peripheral cultures.  HD catheter site without any obvious sign of infection.  11/3: Afebrile this morning.  Blood cultures growing Serratia marcescens.  Repeat chest x-ray this morning remain negative for any acute abnormality.  Concern of catheter acquired infection, apparently it was a difficult insertion after multiple attempts.  Case was discussed with nephrology and vascular surgery and plan is to remove catheter after dialysis today to give him a holiday.  They will reinsert another catheter in 1 to 2 days to continue with dialysis.  Vancomycin  was discontinued.  11/4: HD catheter was removed yesterday by vascular surgery, preliminary catheter tip cultures negative.  Nephrology will give 2 days of catheter holiday and then they will place another HD catheter to continue dialysis.  Susceptibility results from blood cultures are pending.  11/5:  Hemodynamically stable.  Catheter tip culture with similar Serratia marcescens-pansensitive. Repeat blood cultures ordered.  Nephrology will wait for negative blood cultures before replacing a HD catheter.  11/6: Remained hemodynamically stable, repeat blood cultures remain negative.  A new HD catheter to be placed tomorrow morning followed by dialysis.  ID is recommending 4 weeks of cefepime which can be done with dialysis. Echocardiogram normal with no obvious vegetation.  Assessment and Plan: * Sepsis [facility]) Patient met sepsis criteria with tachycardia, tachypnea, fever and leukocytosis. respiratory panel negative for COVID, influenza and RSV.   Chest x-ray remained negative.  RVP negative Blood cultures growing pansensitive Serratia marcescens-catheter tip with a similar bacteria MRSA PCR negative.  Vancomycin  was discontinued. Repeat blood cultures done on 11/5-negative so far HD catheter was removed by vascular surgery on 10/3, will be replaced tomorrow followed by dialysis -Continue with cefepime -ID is recommending 4 weeks of antibiotic likely with cefepime as it can be given with dialysis -Follow-up repeat blood culture results  ESRD on dialysis [facility]) Patient is on TTS schedule, patient had dialysis on Monday and now getting couple of days of holiday. - Monitor renal function - Nephrology is on board  Elevated troponin Patient denies any chest pain but having some shortness of breath.  Troponin elevated at 122>>68.  EKG with sinus tachycardia and nonspecific ST changes.  Likely secondary to demand ischemia with sepsis. -Continue to monitor  Hypertension Blood pressure currently within goal Not on any antihypertensives at home ,uses midodrine on the day of dialysis. -Continue with 3 times daily midodrine for now - Continue to monitor  BPH (benign prostatic hyperplasia) - Continue Flomax   Obesity,  Class III, BMI 40-49.9 (morbid obesity) (HCC) Estimated body mass index is  40.22 kg/m as calculated from the following:   Height as of this encounter: 5' 8 (1.727 m).   Weight as of this encounter: 120 kg.   - This will complicate overall prognosis -Encouraged weight loss  Subjective: Patient was seen and examined today.  No new concern.  Physical Exam: Vitals:   07/03/24 2023 07/04/24 0402 07/04/24 0822 07/04/24 1157  BP: 131/65 132/75 104/63 128/80  Pulse: 71 73 74 68  Resp: 17 17 18 17   Temp: 97.9 F (36.6 C) 98.7 F (37.1 C) 98.4 F (36.9 C) 98.7 F (37.1 C)  TempSrc:   Oral Oral  SpO2: 99% 99% 100% 99%  Weight:      Height:       General.  Autistic and obese gentleman, in no acute distress. Pulmonary.  Lungs clear bilaterally, normal respiratory effort. CV.  Regular rate and rhythm, no JVD, rub or murmur. Abdomen.  Soft, nontender, nondistended, BS positive. CNS.  Alert and oriented .  No focal neurologic deficit. Extremities.  No edema, no cyanosis, pulses intact and symmetrical.   Data Reviewed: Prior data reviewed  Family Communication: Discussed with patient  Disposition: Status is: Inpatient Remains inpatient appropriate because: Severity of illness  Planned Discharge Destination: Home  DVT prophylaxis.  Subcu heparin  Time spent: 50 minutes  This record has been created using Conservation officer, historic buildings. Errors have been sought and corrected,but may not always be located. Such creation errors do not reflect on the standard of care.   Author: Amaryllis Dare, MD 07/04/2024 1:26 PM  For on call review [email or web address].

## 2024-07-04 NOTE — H&P (View-Only) (Signed)
 Progress Note    07/04/2024 7:14 AM 3 Days Post-Op  Subjective:  This is a 53 y.o. male  with past medical history of autism, hypertension, OCD, and end stage renal disease on hemodialysis. Patient presents to ED with shortness of breath and has been admitted for shortness of breath, sepsis, fever.   On 06/26/2024 patient had a dialysis perm catheter exchange due to a nonfunctional PermCath.  Upon workup patient was noted to have blood cultures from 06/30/2024 positive with GNR, BC ID detecting Serratia.  There is no notable source due to the patient being on antibiotics for possible pneumonia.  Therefore this could possibly be catheter infection. On 07/01/2024 perm catheter was removed. Patient has now had a 3 day holiday from any dialysis access. Vascular surgery consulted for placement of new dialysis access.    Vitals:   07/03/24 2023 07/04/24 0402  BP: 131/65 132/75  Pulse: 71 73  Resp: 17 17  Temp: 97.9 F (36.6 C) 98.7 F (37.1 C)  SpO2: 99% 99%   Physical Exam: Cardiac:  RRR, Normal S1, S2 with Murmurs noted.  Lungs: Clear to auscultation throughout, no rales rhonchi or wheezing. Incisions: Left chest where dialysis access was removed.  Dressing clean dry and intact.  No hematoma seroma note. Extremities: Palpable pulses throughout, all extremities warm to touch. Abdomen: Positive bowel sounds throughout, soft, nontender nondistended. Neurologic: Alert and oriented x1 name only and at his baseline for his autism.  CBC    Component Value Date/Time   WBC 9.8 07/04/2024 0329   RBC 2.68 (L) 07/04/2024 0329   HGB 8.4 (L) 07/04/2024 0329   HGB 14.9 05/06/2024 0936   HCT 25.0 (L) 07/04/2024 0329   HCT 45.4 05/06/2024 0936   PLT 184 07/04/2024 0329   PLT 316 05/06/2024 0936   MCV 93.3 07/04/2024 0329   MCV 83 05/06/2024 0936   MCH 31.3 07/04/2024 0329   MCHC 33.6 07/04/2024 0329   RDW 18.2 (H) 07/04/2024 0329   RDW 13.4 05/06/2024 0936   LYMPHSABS 1.0 06/30/2024 1337    LYMPHSABS 1.9 05/06/2024 0936   MONOABS 0.9 06/30/2024 1337   EOSABS 0.1 06/30/2024 1337   EOSABS 0.1 05/06/2024 0936   BASOSABS 0.1 06/30/2024 1337   BASOSABS 0.1 05/06/2024 0936    BMET    Component Value Date/Time   NA 139 07/04/2024 0329   NA 140 05/06/2024 0936   K 4.3 07/04/2024 0329   CL 100 07/04/2024 0329   CO2 22 07/04/2024 0329   GLUCOSE 82 07/04/2024 0329   BUN 65 (H) 07/04/2024 0329   BUN 18 05/06/2024 0936   CREATININE 18.29 (H) 07/04/2024 0329   CALCIUM  6.7 (L) 07/04/2024 0329   GFRNONAA 3 (L) 07/04/2024 0329   GFRAA 8 (L) 04/24/2020 1219    INR    Component Value Date/Time   INR 1.3 (H) 06/30/2024 1337     Intake/Output Summary (Last 24 hours) at 07/04/2024 0714 Last data filed at 07/03/2024 2200 Gross per 24 hour  Intake 696.67 ml  Output --  Net 696.67 ml     Assessment/Plan:  53 y.o. male is s/p dialysis Perm Catheter removal.  3 Days Post-Op   PLAN Patient now needs placement of dialysis access. Vascular surgery plans on taking the patient to the vascular lab tomorrow 07/05/2024 for dialysis perm catheter placement. I called the patients father Crew Goren Sr. For consent. He gave phone consent to place new dialysis access. I discussed in detail with him the procedure,  benefits, risks and complications. He verbalizes his understanding and wishes to proceed with placement of dialysis access. Patient will be made NPO after midnight for the procedure tomorrow. Patient is currently on Heparin  5000 units Sq every 8 hours. This will be held tomorrow morning for procedure.    DVT prophylaxis:  Heparin  5000 units Sq Q 8hrs.    Gwendlyn JONELLE [facility]ank [facility] 07/04/2024 7:14 AM

## 2024-07-04 NOTE — Progress Notes (Signed)
 [facility]  ROUNDING NOTE   Subjective:   Joseph Gilmore is a 53 y.o. male with past medical history of autism, hypertension, OCD, and end stage renal disease on hemodialysis. Patient presents to ED with shortness of breath and has been admitted for Shortness of breath [R06.02] Sepsis (HCC) [A41.9] Fever, unspecified fever cause [R50.9]  Patient is known to our practice and receives outpatient dialysis treatments at [facility] on a TTS schedule, supervised by Dr Douglas.    Update: Patient seen resting in bed Denies shortness of breath, currently on room air Appetite appropriate   Objective:  Vital signs in last 24 hours:  Temp:  [97.9 F (36.6 C)-98.7 F (37.1 C)] 98.7 F (37.1 C) (11/06 1157) Pulse Rate:  [68-74] 68 (11/06 1157) Resp:  [17-18] 17 (11/06 1157) BP: (104-132)/(47-80) 128/80 (11/06 1157) SpO2:  [99 %-100 %] 99 % (11/06 1157)  Weight change:  Filed Weights   06/30/24 1301 07/01/24 1320 07/01/24 1708  Weight: 120 kg 122.9 kg 121.3 kg    Intake/Output: I/O last 3 completed shifts: In: 696.7 [P.O.:540; IV Piggyback:156.7] Out: -    Intake/Output this shift:  Total I/O In: 180 [P.O.:180] Out: -   Physical Exam: General: NAD  Head: Normocephalic, atraumatic. Moist oral mucosal membranes  Eyes: Anicteric  Lungs:  Diminished, normal effort  Heart: Regular rate and rhythm  Abdomen:  Soft, nontender  Extremities:  Trace peripheral edema.  Neurologic: Awake, alert, conversant  Skin: Warm,dry, no rash  Access: Removed on 11/3    Basic Metabolic Panel: Recent Labs  Lab 06/30/24 1337 07/01/24 0419 07/03/24 0814 07/04/24 0329  NA 137 132* 137 139  K 4.3 3.8 3.9 4.3  CL 94* 96* 98 100  CO2 21* 20* 22 22  GLUCOSE 92 81 82 82  BUN 56* 69* 58* 65*  CREATININE 15.56* 17.48* 16.14* 18.29*  CALCIUM  7.8* 6.6* 6.9* 6.7*  PHOS  --   --  5.1* 6.2*    Liver Function Tests: Recent Labs  Lab 06/30/24 1337 07/03/24 0814  07/04/24 0329  AST 31  --   --   ALT 18  --   --   ALKPHOS 59  --   --   BILITOT 0.6  --   --   PROT 7.5  --   --   ALBUMIN 3.1* 2.6* 2.5*   No results for input(s): LIPASE, AMYLASE in the last 168 hours. No results for input(s): AMMONIA in the last 168 hours.  CBC: Recent Labs  Lab 06/30/24 1337 07/01/24 0419 07/04/24 0329  WBC 12.6* 9.8 9.8  NEUTROABS 10.3*  --   --   HGB 10.2* 8.3* 8.4*  HCT 31.7* 24.8* 25.0*  MCV 96.4 93.6 93.3  PLT 150 134* 184    Cardiac Enzymes: No results for input(s): CKTOTAL, CKMB, CKMBINDEX, TROPONINI in the last 168 hours.  BNP: Invalid input(s): POCBNP  CBG: No results for input(s): GLUCAP in the last 168 hours.   Microbiology: Results for orders placed or performed during the hospital encounter of 06/30/24  Culture, blood (Routine x 2)     Status: Abnormal   Collection Time: 06/30/24  1:37 PM   Specimen: BLOOD  Result Value Ref Range Status   Specimen Description   Final    BLOOD RIGHT ANTECUBITAL Performed at [facility], [address] [place], [place] [address]    Special Requests   Final    BOTTLES DRAWN AEROBIC AND ANAEROBIC Blood Culture adequate volume Performed  at [facility], [address], [place], [place] [address]    Culture  Setup Time   Final    GRAM NEGATIVE RODS IN BOTH AEROBIC AND ANAEROBIC BOTTLES CRITICAL RESULT CALLED TO, READ BACK BY AND VERIFIED WITH: MADISON HUNT PHARM.D 06/30/24 1004 KG Performed at [facility], [address] [place], [place] [address]    Culture SERRATIA MARCESCENS (A)  Final   Report Status 07/04/2024 FINAL  Final   Organism ID, Bacteria SERRATIA MARCESCENS  Final      Susceptibility   Serratia marcescens - MIC*    CEFEPIME <=0.12 SENSITIVE Sensitive     ERTAPENEM <=0.12 SENSITIVE Sensitive     CEFTRIAXONE  <=0.25 SENSITIVE Sensitive     CIPROFLOXACIN 0.12 SENSITIVE Sensitive     GENTAMICIN <=1 SENSITIVE Sensitive      MEROPENEM <=0.25 SENSITIVE Sensitive     TRIMETH/SULFA <=20 SENSITIVE Sensitive     * SERRATIA MARCESCENS  Blood Culture ID Panel (Reflexed)     Status: Abnormal   Collection Time: 06/30/24  1:37 PM  Result Value Ref Range Status   Enterococcus faecalis NOT DETECTED NOT DETECTED Final   Enterococcus Faecium NOT DETECTED NOT DETECTED Final   Listeria monocytogenes NOT DETECTED NOT DETECTED Final   Staphylococcus species NOT DETECTED NOT DETECTED Final   Staphylococcus aureus (BCID) NOT DETECTED NOT DETECTED Final   Staphylococcus epidermidis NOT DETECTED NOT DETECTED Final   Staphylococcus lugdunensis NOT DETECTED NOT DETECTED Final   Streptococcus species NOT DETECTED NOT DETECTED Final   Streptococcus agalactiae NOT DETECTED NOT DETECTED Final   Streptococcus pneumoniae NOT DETECTED NOT DETECTED Final   Streptococcus pyogenes NOT DETECTED NOT DETECTED Final   A.calcoaceticus-baumannii NOT DETECTED NOT DETECTED Final   Bacteroides fragilis NOT DETECTED NOT DETECTED Final   Enterobacterales DETECTED (A) NOT DETECTED Final    Comment: Enterobacterales represent a large order of gram negative bacteria, not a single organism. CRITICAL RESULT CALLED TO, READ BACK BY AND VERIFIED WITH: MADISON HUNT PHARM.D 07/01/24 1004 KG    Enterobacter cloacae complex NOT DETECTED NOT DETECTED Final   Escherichia coli NOT DETECTED NOT DETECTED Final   Klebsiella aerogenes NOT DETECTED NOT DETECTED Final   Klebsiella oxytoca NOT DETECTED NOT DETECTED Final   Klebsiella pneumoniae NOT DETECTED NOT DETECTED Final   Proteus species NOT DETECTED NOT DETECTED Final   Salmonella species NOT DETECTED NOT DETECTED Final   Serratia marcescens DETECTED (A) NOT DETECTED Final    Comment: CRITICAL RESULT CALLED TO, READ BACK BY AND VERIFIED WITH: MADISON HUNT PHARM.D 07/01/24 1004 KG    Haemophilus influenzae NOT DETECTED NOT DETECTED Final   Neisseria meningitidis NOT DETECTED NOT DETECTED Final   Pseudomonas  aeruginosa NOT DETECTED NOT DETECTED Final   Stenotrophomonas maltophilia NOT DETECTED NOT DETECTED Final   Candida albicans NOT DETECTED NOT DETECTED Final   Candida auris NOT DETECTED NOT DETECTED Final   Candida glabrata NOT DETECTED NOT DETECTED Final   Candida krusei NOT DETECTED NOT DETECTED Final   Candida parapsilosis NOT DETECTED NOT DETECTED Final   Candida tropicalis NOT DETECTED NOT DETECTED Final   Cryptococcus neoformans/gattii NOT DETECTED NOT DETECTED Final   CTX-M ESBL NOT DETECTED NOT DETECTED Final   Carbapenem resistance IMP NOT DETECTED NOT DETECTED Final   Carbapenem resistance KPC NOT DETECTED NOT DETECTED Final   Carbapenem resistance NDM NOT DETECTED NOT DETECTED Final   Carbapenem resist OXA 48 LIKE NOT DETECTED NOT DETECTED Final   Carbapenem resistance VIM NOT DETECTED NOT  DETECTED Final    Comment: Performed at [facility], [address] [place], [place] [address]  Culture, blood (Routine x 2)     Status: Abnormal   Collection Time: 06/30/24  2:59 PM   Specimen: BLOOD RIGHT FOREARM  Result Value Ref Range Status   Specimen Description   Final    BLOOD RIGHT FOREARM Performed at [facility], [address] [place], [place] [address]    Special Requests   Final    BOTTLES DRAWN AEROBIC AND ANAEROBIC Blood Culture adequate volume Performed at [facility], [address] [place], [place] [address]    Culture  Setup Time   Final    GRAM NEGATIVE RODS IN BOTH AEROBIC AND ANAEROBIC BOTTLES CRITICAL VALUE NOTED.  VALUE IS CONSISTENT WITH PREVIOUSLY REPORTED AND CALLED VALUE. Performed at [facility], [address] [place], [place] [address]    Culture (A)  Final    SERRATIA MARCESCENS SUSCEPTIBILITIES PERFORMED ON PREVIOUS CULTURE WITHIN THE LAST 5 DAYS. Performed at [facility], [address] [place], [place] [address]    Report Status 07/04/2024 FINAL  Final  Resp panel by RT-PCR (RSV, Flu A&B,  Covid) Anterior Nasal Swab     Status: None   Collection Time: 06/30/24  3:39 PM   Specimen: Anterior Nasal Swab  Result Value Ref Range Status   SARS Coronavirus 2 by RT PCR NEGATIVE NEGATIVE Final    Comment: (NOTE) SARS-CoV-2 target nucleic acids are NOT DETECTED.  The SARS-CoV-2 RNA is generally detectable in upper respiratory specimens during the acute phase of infection. The lowest concentration of SARS-CoV-2 viral copies this assay can detect is 138 copies/mL. A negative result does not preclude SARS-Cov-2 infection and should not be used as the sole basis for treatment or other patient management decisions. A negative result may occur with  improper specimen collection/handling, submission of specimen other than nasopharyngeal swab, presence of viral mutation(s) within the areas targeted by this assay, and inadequate number of viral copies(<138 copies/mL). A negative result must be combined with clinical observations, patient history, and epidemiological information. The expected result is Negative.  Fact Sheet for Patients:  bloggercourse.com  Fact Sheet for Healthcare Providers:  seriousbroker.it  This test is no t yet approved or cleared by the [place]  FDA and  has been authorized for detection and/or diagnosis of SARS-CoV-2 by FDA under an Emergency Use Authorization (EUA). This EUA will remain  in effect (meaning this test can be used) for the duration of the COVID-19 declaration under Section 564(b)(1) of the Act, 21 U.S.C.section 360bbb-3(b)(1), unless the authorization is terminated  or revoked sooner.       Influenza A by PCR NEGATIVE NEGATIVE Final   Influenza B by PCR NEGATIVE NEGATIVE Final    Comment: (NOTE) The Xpert Xpress SARS-CoV-2/FLU/RSV plus assay is intended as an aid in the diagnosis of influenza from Nasopharyngeal swab specimens and should not be used as a sole basis for treatment. Nasal  washings and aspirates are unacceptable for Xpert Xpress SARS-CoV-2/FLU/RSV testing.  Fact Sheet for Patients: bloggercourse.com  Fact Sheet for Healthcare Providers: seriousbroker.it  This test is not yet approved or cleared by the [place]  FDA and has been authorized for detection and/or diagnosis of SARS-CoV-2 by FDA under an Emergency Use Authorization (EUA). This EUA will remain in effect (meaning this test can be used) for the duration of the COVID-19 declaration under Section 564(b)(1) of the Act, 21 U.S.C. section  360bbb-3(b)(1), unless the authorization is terminated or revoked.     Resp Syncytial Virus by PCR NEGATIVE NEGATIVE Final    Comment: (NOTE) Fact Sheet for Patients: bloggercourse.com  Fact Sheet for Healthcare Providers: seriousbroker.it  This test is not yet approved or cleared by the [place]  FDA and has been authorized for detection and/or diagnosis of SARS-CoV-2 by FDA under an Emergency Use Authorization (EUA). This EUA will remain in effect (meaning this test can be used) for the duration of the COVID-19 declaration under Section 564(b)(1) of the Act, 21 U.S.C. section 360bbb-3(b)(1), unless the authorization is terminated or revoked.  Performed at [facility], [address] [place], [place] [address]   Respiratory (~20 pathogens) panel by PCR     Status: None   Collection Time: 07/01/24  7:46 AM   Specimen: Nasopharyngeal Swab; Respiratory  Result Value Ref Range Status   Adenovirus NOT DETECTED NOT DETECTED Final   Coronavirus 229E NOT DETECTED NOT DETECTED Final    Comment: (NOTE) The Coronavirus on the Respiratory Panel, DOES NOT test for the novel  Coronavirus (2019 nCoV)    Coronavirus HKU1 NOT DETECTED NOT DETECTED Final   Coronavirus NL63 NOT DETECTED NOT DETECTED Final   Coronavirus OC43 NOT DETECTED NOT DETECTED  Final   Metapneumovirus NOT DETECTED NOT DETECTED Final   Rhinovirus / Enterovirus NOT DETECTED NOT DETECTED Final   Influenza A NOT DETECTED NOT DETECTED Final   Influenza B NOT DETECTED NOT DETECTED Final   Parainfluenza Virus 1 NOT DETECTED NOT DETECTED Final   Parainfluenza Virus 2 NOT DETECTED NOT DETECTED Final   Parainfluenza Virus 3 NOT DETECTED NOT DETECTED Final   Parainfluenza Virus 4 NOT DETECTED NOT DETECTED Final   Respiratory Syncytial Virus NOT DETECTED NOT DETECTED Final   Bordetella pertussis NOT DETECTED NOT DETECTED Final   Bordetella Parapertussis NOT DETECTED NOT DETECTED Final   Chlamydophila pneumoniae NOT DETECTED NOT DETECTED Final   Mycoplasma pneumoniae NOT DETECTED NOT DETECTED Final    Comment: Performed at [facility], [address], [place], [place] [address]  MRSA Next Gen by PCR, Nasal     Status: None   Collection Time: 07/01/24  7:46 AM   Specimen: Nasal Mucosa; Nasal Swab  Result Value Ref Range Status   MRSA by PCR Next Gen NOT DETECTED NOT DETECTED Final    Comment: (NOTE) The GeneXpert MRSA Assay (FDA approved for NASAL specimens only), is one component of a comprehensive MRSA colonization surveillance program. It is not intended to diagnose MRSA infection nor to guide or monitor treatment for MRSA infections. Test performance is not FDA approved in patients less than 17 years old. Performed at [facility], [address] [place], [place] [address]   Cath Tip Culture     Status: Abnormal   Collection Time: 07/01/24  5:56 PM   Specimen: Catheter Tip; Other  Result Value Ref Range Status   Specimen Description   Final    CATH TIP Performed at [facility], [address] [place], [place] [address]    Special Requests   Final    NONE Performed at [facility], [address] [place], [place] [address]    Culture 10,000 COLONIES/mL SERRATIA MARCESCENS (A)  Final   Report Status 07/04/2024 FINAL   Final   Organism ID, Bacteria SERRATIA MARCESCENS (A)  Final      Susceptibility   Serratia marcescens - MIC*    CEFEPIME <=0.12 SENSITIVE Sensitive  ERTAPENEM <=0.12 SENSITIVE Sensitive     CEFTRIAXONE  <=0.25 SENSITIVE Sensitive     CIPROFLOXACIN 0.12 SENSITIVE Sensitive     GENTAMICIN <=1 SENSITIVE Sensitive     MEROPENEM <=0.25 SENSITIVE Sensitive     TRIMETH/SULFA <=20 SENSITIVE Sensitive     * 10,000 COLONIES/mL SERRATIA MARCESCENS  Culture, blood (Routine X 2) w Reflex to ID Panel     Status: None (Preliminary result)   Collection Time: 07/03/24  9:24 AM   Specimen: BLOOD  Result Value Ref Range Status   Specimen Description BLOOD BLOOD RIGHT HAND  Final   Special Requests   Final    BOTTLES DRAWN AEROBIC AND ANAEROBIC Blood Culture adequate volume   Culture   Final    NO GROWTH < 24 HOURS Performed at [facility], [address] [place], [place] [address]    Report Status PENDING  Incomplete  Culture, blood (Routine X 2) w Reflex to ID Panel     Status: None (Preliminary result)   Collection Time: 07/03/24  9:24 AM   Specimen: BLOOD  Result Value Ref Range Status   Specimen Description BLOOD BLOOD RIGHT ARM  Final   Special Requests   Final    BOTTLES DRAWN AEROBIC AND ANAEROBIC Blood Culture adequate volume   Culture   Final    NO GROWTH < 24 HOURS Performed at [facility], [address] [place], [place] [address]    Report Status PENDING  Incomplete    Coagulation Studies: No results for input(s): LABPROT, INR in the last 72 hours.   Urinalysis: No results for input(s): COLORURINE, LABSPEC, PHURINE, GLUCOSEU, HGBUR, BILIRUBINUR, KETONESUR, PROTEINUR, UROBILINOGEN, NITRITE, LEUKOCYTESUR in the last 72 hours.  Invalid input(s): APPERANCEUR    Imaging: No results found.    Medications:    ceFEPime (MAXIPIME) IV 1 g (07/03/24 1658)    heparin   5,000 Units Subcutaneous Q8H   midodrine  10 mg Oral TID  WC   multivitamin  1 tablet Oral QHS   tamsulosin   0.4 mg Oral Daily   acetaminophen  **OR** acetaminophen , ondansetron  **OR** ondansetron  (ZOFRAN ) IV, polyethylene glycol  Assessment/ Plan:  Mr. BREVIN MCFADDEN Gilmore is a 53 y.o.  male with past medical history of autism, hypertension, OCD, and end stage renal disease on hemodialysis. Patient presented for scheduled permcath exchange with vascular surgery. After procedure, patient became agitated with intermittent somnolence. Required Narcan without sufficient response. He was admitted for observation.   End stage renal disease on hemodialysis.  Last dialysis treatment received on 11/3, prior to permcath removal.  Recent blood cultures negative for 24 hours.  Vascular surgery plans to place PermCath tomorrow.  Will schedule dialysis treatment after placement.  Anemia of chronic kidney disease  Lab Results  Component Value Date   HGB 8.4 (L) 07/04/2024   Hemoglobin stable.  Will consider ESA with dialysis tomorrow.  3. 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 Most recent hemoglobin A1c is 5.2 on 06/26/24.   Diet controlled   LOS: 4 Idalia Allbritton 11/6/202512:10 PM

## 2024-07-04 NOTE — H&P (View-Only) (Signed)
 Progress Note    07/04/2024 7:14 AM 3 Days Post-Op  Subjective:  This is a 53 y.o. male  with past medical history of autism, hypertension, OCD, and end stage renal disease on hemodialysis. Patient presents to ED with shortness of breath and has been admitted for shortness of breath, sepsis, fever.   On 06/26/2024 patient had a dialysis perm catheter exchange due to a nonfunctional PermCath.  Upon workup patient was noted to have blood cultures from 06/30/2024 positive with GNR, BC ID detecting Serratia.  There is no notable source due to the patient being on antibiotics for possible pneumonia.  Therefore this could possibly be catheter infection. On 07/01/2024 perm catheter was removed. Patient has now had a 3 day holiday from any dialysis access. Vascular surgery consulted for placement of new dialysis access.    Vitals:   07/03/24 2023 07/04/24 0402  BP: 131/65 132/75  Pulse: 71 73  Resp: 17 17  Temp: 97.9 F (36.6 C) 98.7 F (37.1 C)  SpO2: 99% 99%   Physical Exam: Cardiac:  RRR, Normal S1, S2 with Murmurs noted.  Lungs: Clear to auscultation throughout, no rales rhonchi or wheezing. Incisions: Left chest where dialysis access was removed.  Dressing clean dry and intact.  No hematoma seroma note. Extremities: Palpable pulses throughout, all extremities warm to touch. Abdomen: Positive bowel sounds throughout, soft, nontender nondistended. Neurologic: Alert and oriented x1 name only and at his baseline for his autism.  CBC    Component Value Date/Time   WBC 9.8 07/04/2024 0329   RBC 2.68 (L) 07/04/2024 0329   HGB 8.4 (L) 07/04/2024 0329   HGB 14.9 05/06/2024 0936   HCT 25.0 (L) 07/04/2024 0329   HCT 45.4 05/06/2024 0936   PLT 184 07/04/2024 0329   PLT 316 05/06/2024 0936   MCV 93.3 07/04/2024 0329   MCV 83 05/06/2024 0936   MCH 31.3 07/04/2024 0329   MCHC 33.6 07/04/2024 0329   RDW 18.2 (H) 07/04/2024 0329   RDW 13.4 05/06/2024 0936   LYMPHSABS 1.0 06/30/2024 1337    LYMPHSABS 1.9 05/06/2024 0936   MONOABS 0.9 06/30/2024 1337   EOSABS 0.1 06/30/2024 1337   EOSABS 0.1 05/06/2024 0936   BASOSABS 0.1 06/30/2024 1337   BASOSABS 0.1 05/06/2024 0936    BMET    Component Value Date/Time   NA 139 07/04/2024 0329   NA 140 05/06/2024 0936   K 4.3 07/04/2024 0329   CL 100 07/04/2024 0329   CO2 22 07/04/2024 0329   GLUCOSE 82 07/04/2024 0329   BUN 65 (H) 07/04/2024 0329   BUN 18 05/06/2024 0936   CREATININE 18.29 (H) 07/04/2024 0329   CALCIUM  6.7 (L) 07/04/2024 0329   GFRNONAA 3 (L) 07/04/2024 0329   GFRAA 8 (L) 04/24/2020 1219    INR    Component Value Date/Time   INR 1.3 (H) 06/30/2024 1337     Intake/Output Summary (Last 24 hours) at 07/04/2024 0714 Last data filed at 07/03/2024 2200 Gross per 24 hour  Intake 696.67 ml  Output --  Net 696.67 ml     Assessment/Plan:  53 y.o. male is s/p dialysis Perm Catheter removal.  3 Days Post-Op   PLAN Patient now needs placement of dialysis access. Vascular surgery plans on taking the patient to the vascular lab tomorrow 07/05/2024 for dialysis perm catheter placement. I called the patients father Joseph Goren Sr. For consent. He gave phone consent to place new dialysis access. I discussed in detail with him the procedure,  benefits, risks and complications. He verbalizes his understanding and wishes to proceed with placement of dialysis access. Patient will be made NPO after midnight for the procedure tomorrow. Patient is currently on Heparin  5000 units Sq every 8 hours. This will be held tomorrow morning for procedure.    DVT prophylaxis:  Heparin  5000 units Sq Q 8hrs.    [facility] 07/04/2024 7:14 AM

## 2024-07-04 NOTE — Plan of Care (Signed)
  Problem: Clinical Measurements: Goal: Ability to maintain clinical measurements within normal limits will improve Outcome: Progressing Goal: Will remain free from infection Outcome: Progressing Goal: Respiratory complications will improve Outcome: Progressing   Problem: Nutrition: Goal: Adequate nutrition will be maintained Outcome: Progressing   Problem: Safety: Goal: Ability to remain free from injury will improve Outcome: Progressing

## 2024-07-04 NOTE — Progress Notes (Signed)
 Date of Admission:  06/30/2024     ID: Joseph Gilmore is a 53 y.o. male Principal Problem:   Sepsis (HCC) Active Problems:   BPH (benign prostatic hyperplasia)   Hypertension   ESRD on dialysis (HCC)   Elevated troponin   Obesity, Class III, BMI 40-49.9 (morbid obesity) (HCC)   Infection of dialysis vascular access    Subjective: Pt states he is feeling fine  Medications:   heparin   5,000 Units Subcutaneous Q8H   midodrine  10 mg Oral TID WC   multivitamin  1 tablet Oral QHS   tamsulosin   0.4 mg Oral Daily    Objective: Vital signs in last 24 hours: Patient Vitals for the past 24 hrs:  BP Temp Temp src Pulse Resp SpO2  07/04/24 1618 (!) 120/55 98.1 F (36.7 C) Oral 70 18 100 %  07/04/24 1157 128/80 98.7 F (37.1 C) Oral 68 17 99 %  07/04/24 0822 104/63 98.4 F (36.9 C) Oral 74 18 100 %  07/04/24 0402 132/75 98.7 F (37.1 C) -- 73 17 99 %  07/03/24 2023 131/65 97.9 F (36.6 C) -- 71 17 99 %      PHYSICAL EXAM:  General: Alert, cooperative, no distress, appears stated age.  Lungs: Clear to auscultation bilaterally. No Wheezing or Rhonchi. No rales. Heart: Regular rate and rhythm, no murmur, rub or gallop. Abdomen: Soft, non-tender,not distended. Bowel sounds normal. No masses Extremities: atraumatic, no cyanosis. No edema. No clubbing Skin: No rashes or lesions. Or bruising Lymph: Cervical, supraclavicular normal. Neurologic: Grossly non-focal  Lab Results    Latest Ref Rng & Units 07/04/2024    3:29 AM 07/01/2024    4:19 AM 06/30/2024    1:37 PM  CBC  WBC 4.0 - 10.5 K/uL 9.8  9.8  12.6   Hemoglobin 13.0 - 17.0 g/dL 8.4  8.3  89.7   Hematocrit 39.0 - 52.0 % 25.0  24.8  31.7   Platelets 150 - 400 K/uL 184  134  150        Latest Ref Rng & Units 07/04/2024    3:29 AM 07/03/2024    8:14 AM 07/01/2024    4:19 AM  CMP  Glucose 70 - 99 mg/dL 82  82  81   BUN 6 - 20 mg/dL 65  58  69   Creatinine 0.61 - 1.24 mg/dL 81.70  83.85  82.51   Sodium 135 - 145  mmol/L 139  137  132   Potassium 3.5 - 5.1 mmol/L 4.3  3.9  3.8   Chloride 98 - 111 mmol/L 100  98  96   CO2 22 - 32 mmol/L 22  22  20    Calcium  8.9 - 10.3 mg/dL 6.7  6.9  6.6       Microbiology:  Studies/Results: ECHOCARDIOGRAM COMPLETE Result Date: 07/04/2024    ECHOCARDIOGRAM REPORT   Patient Name:   Joseph Gilmore Date of Exam: 07/04/2024 Medical Rec #:  [id number]        Height:       68.0 in Accession #:    [id number]       Weight:       267.4 lb Date of Birth:  1970-11-06        BSA:          2.312 m Patient Age:    53 years         BP:  132/75 mmHg Patient Gender: M                HR:           73 bpm. Exam Location:  [facility] Procedure: 2D Echo, Cardiac Doppler and Color Doppler (Both Spectral and Color            Flow Doppler were utilized during procedure). Indications:     Bacteremia R78.81  History:         Patient has prior history of Echocardiogram examinations, most                  recent 04/06/2020. Signs/Symptoms:Murmur; Risk                  Factors:Hypertension.  Sonographer:     Christopher Furnace Referring Phys:  [id number] DONALD BERLIN Diagnosing Phys: Evalene Lunger MD  Sonographer Comments: Technically challenging study due to limited acoustic windows, no apical window and no subcostal window. IMPRESSIONS  1. Left ventricular ejection fraction, by estimation, is 60 to 65%. The left ventricle has normal function. The left ventricle has no regional wall motion abnormalities. There is mild left ventricular hypertrophy. Left ventricular diastolic parameters are indeterminate.  2. Right ventricular systolic function is normal. The right ventricular size is normal.  3. The mitral valve is normal in structure. No evidence of mitral valve regurgitation. No evidence of mitral stenosis.  4. The aortic valve is normal in structure. Aortic valve regurgitation is not visualized. No aortic stenosis is present.  5. The inferior vena cava is normal in size with greater than 50% respiratory  variability, suggesting right atrial pressure of 3 mmHg.  6. Valves not well visualized, grossly no vegetation. FINDINGS  Left Ventricle: Left ventricular ejection fraction, by estimation, is 60 to 65%. The left ventricle has normal function. The left ventricle has no regional wall motion abnormalities. Strain was performed and the global longitudinal strain is indeterminate. The left ventricular internal cavity size was normal in size. There is mild left ventricular hypertrophy. Left ventricular diastolic parameters are indeterminate. Right Ventricle: The right ventricular size is normal. No increase in right ventricular wall thickness. Right ventricular systolic function is normal. Left Atrium: Left atrial size was normal in size. Right Atrium: Right atrial size was normal in size. Pericardium: There is no evidence of pericardial effusion. Mitral Valve: The mitral valve is normal in structure. No evidence of mitral valve regurgitation. No evidence of mitral valve stenosis. Tricuspid Valve: The tricuspid valve is normal in structure. Tricuspid valve regurgitation is not demonstrated. No evidence of tricuspid stenosis. Aortic Valve: The aortic valve is normal in structure. Aortic valve regurgitation is not visualized. No aortic stenosis is present. Pulmonic Valve: The pulmonic valve was normal in structure. Pulmonic valve regurgitation is not visualized. No evidence of pulmonic stenosis. Aorta: The aortic root is normal in size and structure. Venous: The inferior vena cava is normal in size with greater than 50% respiratory variability, suggesting right atrial pressure of 3 mmHg. IAS/Shunts: No atrial level shunt detected by color flow Doppler. Additional Comments: 3D was performed not requiring image post processing on an independent workstation and was indeterminate.  LEFT VENTRICLE PLAX 2D LVIDd:         3.58 cm LVIDs:         2.24 cm LV PW:         1.21 cm LV IVS:        1.32 cm LVOT diam:     2.10 cm  LVOT Area:      3.46 cm  LEFT ATRIUM         Index LA diam:    3.50 cm 1.51 cm/m   AORTA Ao Root diam: 2.80 cm  SHUNTS Systemic Diam: 2.10 cm Evalene Lunger MD Electronically signed by Evalene Lunger MD Signature Date/Time: 07/04/2024/12:18:33 PM    Final      Assessment/Plan: Sepsis with Serratia bacteremia secondary to hemodialysis catheter related infection.  The catheter was placed on 06/26/2024 but prior to it he had another dialysis catheter and this new catheter was exchanged over a guidewire The catheter has been removed and the tip also has Serratia  pansensitive Serratia 11/5 repeat blood culture NG Patient is currently on cefepime Continue the same as it is easy to give  during dialysis 2D echo- technically limited study-  no obvious vegetation We need to treat this with at least  4 weeks of antibiotics as there is concern for endovascular infection from the old catheter   Hypertension.  Patient has been having low BP after dialysis and is on midodrine.   End-stage renal disease on dialysis   Anemia secondary to CKD Autism   BPH on tamsulosin      OPAT Orders Discharge antibiotics: Cefepime to be given during dialysis for 4 weeks 2 grams Tue/2 grams Thursday and 2 grams Saturday  End Date: 07/29/24  [facility] Care Per Protocol:  Labs weekly while on IV antibiotics: __X CBC with differential  _X_ CMP   Fax weekly lab results  promptly to [phone] & [phone]  Clinic Follow Up Appt:07/23/24 at 1045 am   Call [phone] with any questions   ID will sign off- call if needed

## 2024-07-04 NOTE — Progress Notes (Signed)
 Progress Note    07/04/2024 7:14 AM 3 Days Post-Op  Subjective:  This is a 53 y.o. male  with past medical history of autism, hypertension, OCD, and end stage renal disease on hemodialysis. Patient presents to ED with shortness of breath and has been admitted for shortness of breath, sepsis, fever.   On 06/26/2024 patient had a dialysis perm catheter exchange due to a nonfunctional PermCath.  Upon workup patient was noted to have blood cultures from 06/30/2024 positive with GNR, BC ID detecting Serratia.  There is no notable source due to the patient being on antibiotics for possible pneumonia.  Therefore this could possibly be catheter infection. On 07/01/2024 perm catheter was removed. Patient has now had a 3 day holiday from any dialysis access. Vascular surgery consulted for placement of new dialysis access.    Vitals:   07/03/24 2023 07/04/24 0402  BP: 131/65 132/75  Pulse: 71 73  Resp: 17 17  Temp: 97.9 F (36.6 C) 98.7 F (37.1 C)  SpO2: 99% 99%   Physical Exam: Cardiac:  RRR, Normal S1, S2 with Murmurs noted.  Lungs: Clear to auscultation throughout, no rales rhonchi or wheezing. Incisions: Left chest where dialysis access was removed.  Dressing clean dry and intact.  No hematoma seroma note. Extremities: Palpable pulses throughout, all extremities warm to touch. Abdomen: Positive bowel sounds throughout, soft, nontender nondistended. Neurologic: Alert and oriented x1 name only and at his baseline for his autism.  CBC    Component Value Date/Time   WBC 9.8 07/04/2024 0329   RBC 2.68 (L) 07/04/2024 0329   HGB 8.4 (L) 07/04/2024 0329   HGB 14.9 05/06/2024 0936   HCT 25.0 (L) 07/04/2024 0329   HCT 45.4 05/06/2024 0936   PLT 184 07/04/2024 0329   PLT 316 05/06/2024 0936   MCV 93.3 07/04/2024 0329   MCV 83 05/06/2024 0936   MCH 31.3 07/04/2024 0329   MCHC 33.6 07/04/2024 0329   RDW 18.2 (H) 07/04/2024 0329   RDW 13.4 05/06/2024 0936   LYMPHSABS 1.0 06/30/2024 1337    LYMPHSABS 1.9 05/06/2024 0936   MONOABS 0.9 06/30/2024 1337   EOSABS 0.1 06/30/2024 1337   EOSABS 0.1 05/06/2024 0936   BASOSABS 0.1 06/30/2024 1337   BASOSABS 0.1 05/06/2024 0936    BMET    Component Value Date/Time   NA 139 07/04/2024 0329   NA 140 05/06/2024 0936   K 4.3 07/04/2024 0329   CL 100 07/04/2024 0329   CO2 22 07/04/2024 0329   GLUCOSE 82 07/04/2024 0329   BUN 65 (H) 07/04/2024 0329   BUN 18 05/06/2024 0936   CREATININE 18.29 (H) 07/04/2024 0329   CALCIUM  6.7 (L) 07/04/2024 0329   GFRNONAA 3 (L) 07/04/2024 0329   GFRAA 8 (L) 04/24/2020 1219    INR    Component Value Date/Time   INR 1.3 (H) 06/30/2024 1337     Intake/Output Summary (Last 24 hours) at 07/04/2024 0714 Last data filed at 07/03/2024 2200 Gross per 24 hour  Intake 696.67 ml  Output --  Net 696.67 ml     Assessment/Plan:  53 y.o. male is s/p dialysis Perm Catheter removal.  3 Days Post-Op   PLAN Patient now needs placement of dialysis access. Vascular surgery plans on taking the patient to the vascular lab tomorrow 07/05/2024 for dialysis perm catheter placement. I called the patients father Crew Goren Sr. For consent. He gave phone consent to place new dialysis access. I discussed in detail with him the procedure,  benefits, risks and complications. He verbalizes his understanding and wishes to proceed with placement of dialysis access. Patient will be made NPO after midnight for the procedure tomorrow. Patient is currently on Heparin  5000 units Sq every 8 hours. This will be held tomorrow morning for procedure.    DVT prophylaxis:  Heparin  5000 units Sq Q 8hrs.    Gwendlyn JONELLE [facility]ank [facility] 07/04/2024 7:14 AM

## 2024-07-05 ENCOUNTER — Inpatient Hospital Stay
Admission: RE | Admit: 2024-07-05 | Discharge: 2024-07-05 | Disposition: A | Discharge status: Home / Self Care | Source: Ambulatory Visit

## 2024-07-05 ENCOUNTER — Encounter
Admission: EM | Disposition: A | Discharge status: Home / Self Care | Payer: Self-pay | Source: Home / Self Care | Attending: Internal Medicine

## 2024-07-05 ENCOUNTER — Encounter: Payer: Self-pay | Admitting: Vascular Surgery

## 2024-07-05 DIAGNOSIS — A419 Sepsis, unspecified organism: Secondary | ICD-10-CM | POA: Diagnosis not present

## 2024-07-05 DIAGNOSIS — R7881 Bacteremia: Secondary | ICD-10-CM

## 2024-07-05 HISTORY — PX: DIALYSIS/PERMA CATHETER INSERTION: CATH118288

## 2024-07-05 SURGERY — DIALYSIS/PERMA CATHETER INSERTION
Anesthesia: Moderate Sedation

## 2024-07-05 MED ORDER — FAMOTIDINE 20 MG PO TABS: 40.0000 mg | ORAL_TABLET | Freq: Once | ORAL | Status: DC | PRN

## 2024-07-05 MED ORDER — METHYLPREDNISOLONE SODIUM SUCC 125 MG IJ SOLR: 125.0000 mg | Freq: Once | INTRAMUSCULAR | Status: DC | PRN

## 2024-07-05 MED ORDER — FENTANYL CITRATE (PF) 50 MCG/ML IJ SOSY
PREFILLED_SYRINGE | INTRAMUSCULAR | Status: AC
  Filled 2024-07-05: qty 1

## 2024-07-05 MED ORDER — HEPARIN SODIUM (PORCINE) 10000 UNIT/ML IJ SOLN
INTRAMUSCULAR | Status: DC | PRN
  Administered 2024-07-05: 10000 [IU]

## 2024-07-05 MED ORDER — FENTANYL CITRATE (PF) 100 MCG/2ML IJ SOLN
INTRAMUSCULAR | Status: DC | PRN
  Administered 2024-07-05 (×2): 25 ug via INTRAVENOUS

## 2024-07-05 MED ORDER — LIDOCAINE-EPINEPHRINE (PF) 1 %-1:200000 IJ SOLN
INTRAMUSCULAR | Status: DC | PRN
  Administered 2024-07-05: 20 mL via INTRADERMAL

## 2024-07-05 MED ORDER — HEPARIN (PORCINE) IN NACL 1000-0.9 UT/500ML-% IV SOLN
INTRAVENOUS | Status: DC | PRN
  Administered 2024-07-05: 500 mL

## 2024-07-05 MED ORDER — SODIUM CHLORIDE 0.9 % IV SOLN: INTRAVENOUS | Status: DC

## 2024-07-05 MED ORDER — SODIUM CHLORIDE 0.9 % IV SOLN
2.0000 g | INTRAVENOUS | Status: DC
  Filled 2024-07-05: qty 12.5

## 2024-07-05 MED ORDER — MIDAZOLAM HCL 2 MG/ML PO SYRP
8.0000 mg | ORAL_SOLUTION | Freq: Once | ORAL | Status: DC | PRN
  Filled 2024-07-05: qty 5

## 2024-07-05 MED ORDER — MIDAZOLAM HCL (PF) 2 MG/2ML IJ SOLN
INTRAMUSCULAR | Status: DC | PRN
  Administered 2024-07-05: 1 mg via INTRAVENOUS
  Administered 2024-07-05: .5 mg via INTRAVENOUS

## 2024-07-05 MED ORDER — MIDAZOLAM HCL 2 MG/2ML IJ SOLN
INTRAMUSCULAR | Status: AC
Start: 2024-07-05 — End: 2024-07-05
  Filled 2024-07-05: qty 2

## 2024-07-05 MED ORDER — HEPARIN SODIUM (PORCINE) 1000 UNIT/ML IJ SOLN
INTRAMUSCULAR | Status: AC
  Filled 2024-07-05: qty 6

## 2024-07-05 MED ORDER — CEFAZOLIN SODIUM-DEXTROSE 1-4 GM/50ML-% IV SOLN
1.0000 g | INTRAVENOUS | Status: AC
  Administered 2024-07-05: 1 g via INTRAVENOUS

## 2024-07-05 SURGICAL SUPPLY — 12 items
BIOPATCH RED 1 DISK 7.0 (GAUZE/BANDAGES/DRESSINGS) IMPLANT
COVER PROBE ULTRASOUND 5X96 (MISCELLANEOUS) IMPLANT
DERMABOND ADVANCED .7 DNX12 (GAUZE/BANDAGES/DRESSINGS) IMPLANT
DRAPE INCISE IOBAN 66X45 STRL (DRAPES) IMPLANT
GOWN STRL REUS W/ TWL LRG LVL3 (GOWN DISPOSABLE) ×1 IMPLANT
KIT CATH CHRNC PALINDROME 14.5 (CATHETERS) IMPLANT
NDL ENTRY 21GA 7CM ECHOTIP (NEEDLE) IMPLANT
NEEDLE ENTRY 21GA 7CM ECHOTIP (NEEDLE) ×1 IMPLANT
PACK ANGIOGRAPHY (CUSTOM PROCEDURE TRAY) ×1 IMPLANT
SET INTRO CAPELLA COAXIAL (SET/KITS/TRAYS/PACK) IMPLANT
SUT MNCRL AB 4-0 PS2 18 (SUTURE) IMPLANT
SUT SILK 0 FSL (SUTURE) IMPLANT

## 2024-07-05 NOTE — Interval H&P Note (Signed)
 History and Physical Interval Note:  07/05/2024 1:42 PM  Joseph Gilmore  has presented today for surgery, with the diagnosis of ESRD.  The various methods of treatment have been discussed with the patient and family. After consideration of risks, benefits and other options for treatment, the patient has consented to  Procedure(s): DIALYSIS/PERMA CATHETER INSERTION (N/A) as a surgical intervention.  The patient's history has been reviewed, patient examined, no change in status, stable for surgery.  I have reviewed the patient's chart and labs.  Questions were answered to the patient's satisfaction.     Cordella Shawl

## 2024-07-05 NOTE — Plan of Care (Signed)
  Problem: Education: Goal: Knowledge of General Education information will improve Description: Including pain rating scale, medication(s)/side effects and non-pharmacologic comfort measures Outcome: Progressing   Problem: Clinical Measurements: Goal: Will remain free from infection Outcome: Progressing Goal: Cardiovascular complication will be avoided Outcome: Progressing   Problem: Activity: Goal: Risk for activity intolerance will decrease Outcome: Progressing   Problem: Nutrition: Goal: Adequate nutrition will be maintained Outcome: Progressing   

## 2024-07-05 NOTE — Assessment & Plan Note (Signed)
 -  [place]

## 2024-07-05 NOTE — Progress Notes (Signed)
  Received patient in bed to unit.   Informed consent signed and in chart.    TX duration: 24 min     Transported back to floor  Hand-off given to patient's nurse. No acute distress noted    Access used: R Femoral permcath Access issues: start and stop flow from both ports.  Pt connected to dialysis machine and alarmed off the entire time he was on.  Pt only dialyzed for 24 min but was hooked up to machine for almost 1 hr.  CN aware and made MD aware of above,  pt was down to a 150 bfr with arterial pressures of over 400.  Machine in jeopardy of clotting of blood and decision was to rinse back pt.  Pt does have order for dialysis tomorrow.   Total UF removed: none Medication(s) given: none Post HD VS: wnl      Olivia Hurst LPN [facility]

## 2024-07-05 NOTE — Progress Notes (Signed)
 Pt allergic to Chlorhexidine . Unable to order CHG wipes

## 2024-07-05 NOTE — Assessment & Plan Note (Signed)
 Patient denies any chest pain but having some shortness of breath.  Troponin elevated at 122>>68.  EKG with sinus tachycardia and nonspecific ST changes.  Likely secondary to demand ischemia with sepsis.

## 2024-07-05 NOTE — Assessment & Plan Note (Signed)
 Estimated body mass index is 40.66 kg/m as calculated from the following:   Height as of this encounter: 5' 8 (1.727 m).   Weight as of this encounter: 121.3 kg.   This complicates overall care and prognosis.

## 2024-07-05 NOTE — Op Note (Signed)
 [facility]   OPERATIVE NOTE     PROCEDURE: 1. Insertion of a right femoral tunneled dialysis catheter. 2. Catheter placement and cannulation under ultrasound and fluoroscopic guidance  PRE-OPERATIVE DIAGNOSIS: end-stage renal requiring hemodialysis  POST-OPERATIVE DIAGNOSIS: same as above  SURGEON: Cordella Shawl  ANESTHESIA: Conscious sedation was administered under my direct supervision by the interventional radiology RN. IV Versed  plus fentanyl  were utilized. Continuous ECG, pulse oximetry and blood pressure was monitored throughout the entire procedure.  Conscious sedation was for a total of 32 minutes.  ESTIMATED BLOOD LOSS: Minimal  FINDING(S): 1.  Tips of the catheter in the right atrium on fluoroscopy 2.  No obvious pneumothorax on fluoroscopy  SPECIMEN(S):  none  INDICATIONS:   Joseph Gilmore is a 53 y.o. male  presents with end stage renal disease.  Therefore, the patient requires a tunneled dialysis catheter placement.  The patient is informed of  the risks catheter placement include but are not limited to: bleeding, infection, central venous injury, pneumothorax, possible venous stenosis, possible malpositioning in the venous system, and possible infections related to long-term catheter presence.  The patient was aware of these risks and agreed to proceed.  DESCRIPTION: The patient was taken back to Special Procedure suite.  Prior to sedation, the patient was given IV antibiotics.  After obtaining adequate sedation, the patient was prepped and draped in the standard fashion for a right femoral tunneled dialysis catheter placement.  Appropriate Time Out is called.     The right groin and thigh area are then infiltrated with 1% Lidocaine  with epinepherine.  A 44 cm tip to cuff catheter is then selected, opened on the back table and prepped. Ultrasound is placed in a sterile sleeve.  Under ultrasound guidance, the right common femoral vein is  examined and is noted to be echolucent and easily compressible indicating patency.   An image is recorded for the permanent record.  The right common femoral vein is cannulated with the microneedle under direct ultrasound vissualization.  A Microwire followed by a micro sheath is then inserted without difficulty.   A J-wire was then advanced under fluoroscopic guidance into the inferior vena cava and the wire was secured.  Small counter incision was then made at the wire insertion site. A small pocket was fashioned with blunt dissection to allow easier passage of the cuff.  Small incision is made at the selected exit site and the tunneling device was passed subcutaneously to the counter incision. Catheter is then pulled through the subcutaneous tunnel.   The dilator and peel-away sheath are then advanced over the wire under fluoroscopic guidance. The catheter is advanced through the peel-away sheath.    Each port was tested by aspirating and flushing.  No resistance was noted.  Each port was then thoroughly flushed with heparinized saline.  The catheter was secured in placed with two interrupted stitches of 0 silk tied to the catheter.  The counter incision was closed with a U-stitch of 4-0 Monocryl.  The insertion site is then cleaned and sterile bandages applied including a Biopatch.  Each port was then packed with concentrated heparin  (1000 Units/mL) at the manufacturer recommended volumes to each port.  Sterile caps were applied to each port.  On completion fluoroscopy, the tips of the catheter were in the right atrium, and there was no evidence of pneumothorax.  COMPLICATIONS: None  CONDITION: Unchanged   Cordella Shawl [place] [facility] Office: [phone]   07/05/2024, 1:43 PM

## 2024-07-05 NOTE — Progress Notes (Signed)
 PHARMACY CONSULT NOTE FOR:  OUTPATIENT  PARENTERAL ANTIBIOTIC THERAPY (OPAT)  Indication: Serratia bacteremia  Regimen: Cefepime 2 g IV during every HD session on Tues/Thurs/Sat End date: 07/29/24 (last dose on 07/27/24)  Labs - Once weekly:  CBC/D and CMP Fax weekly lab results  promptly to [phone] & [phone]  IV antibiotic discharge orders are pended. To discharging provider:  please sign these orders via discharge navigator,  Select New Orders & click on the button choice - Manage This Unsigned Work.     Thank you for allowing pharmacy to be a part of this patient's care.   Ransom Blanch PGY-1 Pharmacy Resident  [place] [facility]  07/05/2024 8:40 AM

## 2024-07-05 NOTE — Assessment & Plan Note (Addendum)
 Per ID: Continue with cefepime 2 g on Tuesday, 2 g on Thursday, 2 g on Saturday during HD session at HD outpatient clinic, to complete 4 weeks antibiotic end date on 07/29/2024. Follow-up with infectious disease on 07/23/2024 at 10:45 AM. I informed aunt, [name] at [phone]

## 2024-07-05 NOTE — Progress Notes (Signed)
 PROGRESS NOTE  Joseph Gilmore  FMW:[id number] DOB: October 14, 1970 DOA: 06/30/2024 PCP: Albina GORMAN Dine, MD   Joseph Gilmore is a 53 y.o. male with medical history significant of ESRD on TTS dialysis schedule, autistic with history of hypertension but not on any medication now came to ED with concern of cough and shortness of breath started yesterday after dialysis.   Patient had his HD catheter exchange by vascular surgery on 10/29, last dialysis was on Saturday.   On presentation patient was found to be febrile at 103.2, tachycardic at 117, tachypneic 22 and borderline soft blood pressure at 90/54, CBC with neutrophilic predominant leukocytosis at 12.6, renal function consistent with ESRD, lactic acid normal.  Troponin 122, respiratory panel negative for COVID, influenza and RSV. CXR with no acute abnormality. EKG with sinus tachycardia and nonspecific ST changes.   ED provider discussed the case with his vascular surgeon and they advised continue broad-spectrum antibiotics and obtain peripheral cultures.  HD catheter site without any obvious sign of infection.  11/3: Afebrile this morning.  Blood cultures growing Serratia marcescens.  Repeat chest x-ray this morning remain negative for any acute abnormality.  Concern of catheter acquired infection, apparently it was a difficult insertion after multiple attempts.  Case was discussed with nephrology and vascular surgery and plan is to remove catheter after dialysis today to give him a holiday.  They will reinsert another catheter in 1 to 2 days to continue with dialysis.  Vancomycin  was discontinued.  11/4: HD catheter was removed yesterday by vascular surgery, preliminary catheter tip cultures negative.  Nephrology will give 2 days of catheter holiday and then they will place another HD catheter to continue dialysis.  Susceptibility results from blood cultures are pending.  11/5: Hemodynamically stable.  Catheter tip culture with similar  Serratia marcescens-pansensitive. Repeat blood cultures ordered.  Nephrology will wait for negative blood cultures before replacing a HD catheter.  11/6: Remained hemodynamically stable, repeat blood cultures remain negative.  A new HD catheter to be placed tomorrow morning followed by dialysis.  ID is recommending 4 weeks of cefepime which can be done with dialysis. Echocardiogram normal with no obvious vegetation.  Assessment & Plan:   Principal Problem:   Sepsis (HCC) Active Problems:   ESRD on dialysis (HCC)   Hypertension   Elevated troponin   BPH (benign prostatic hyperplasia)   Obesity, Class III, BMI 40-49.9 (morbid obesity) (HCC)   Infection of dialysis vascular access   Serratia infection   Gram-negative bacteremia   Assessment and Plan:  * Sepsis (HCC) Patient met sepsis criteria with tachycardia, tachypnea, fever and leukocytosis. respiratory panel negative for COVID, influenza and RSV.   Chest x-ray remained negative.  RVP negative Blood cultures growing pansensitive Serratia marcescens-catheter tip with a similar bacteria MRSA PCR negative.  Vancomycin  was discontinued. Repeat blood cultures done on 11/5-negative so far HD catheter was removed by vascular surgery on 10/3, will be replaced tomorrow followed by dialysis Continue with cefepime ID is recommending 4 weeks of antibiotic likely with cefepime as it can be given with dialysis Follow-up repeat blood culture results  ESRD on dialysis [facility]) Patient is on TTS schedule, patient had dialysis on Monday and now getting couple of days of holiday. Monitor renal function Nephrology is on board  Elevated troponin Patient denies any chest pain but having some shortness of breath.  Troponin elevated at 122>>68.  EKG with sinus tachycardia and nonspecific ST changes.  Likely secondary to demand ischemia with sepsis.  Hypertension Blood pressure currently within goal Not on any antihypertensives at home ,uses  midodrine on the day of dialysis. -Continue with 3 times daily midodrine for now - Continue to monitor  BPH (benign prostatic hyperplasia) Flomax   Gram-negative bacteremia Per ID: Continue with cefepime 2 g on Tuesday, 2 g on Thursday, 2 g on Saturday, to complete 4 weeks antibiotic end date on 07/29/2024.  Obesity, Class III, BMI 40-49.9 (morbid obesity) (HCC) Estimated body mass index is 40.66 kg/m as calculated from the following:   Height as of this encounter: 5' 8 (1.727 m).   Weight as of this encounter: 121.3 kg.   This complicates overall care and prognosis.   DVT prophylaxis: Heparin  Code Status: Full code Family Communication: Attempted to call Joseph Goldberg at approximately 4:12 PM, father, no pickup Disposition Plan:  Level of care: Telemetry  Consultants:  Disease, nephrology, vascular  Procedures:  Right femoral catheter  Antimicrobials: Cefepime  Subjective:  At bedside, patient able to tell me his first last name, age, location, current calendar year.  He reports he has not had chest pain, shortness of breath, abdominal pain.  He reports he sometimes makes urine still.  Objective: Vitals:   07/05/24 1513 07/05/24 1530 07/05/24 1553 07/05/24 1559  BP: 121/75 132/79 112/66 (!) 143/72  Pulse: (!) 53 74 79 77  Resp: 14 16 15 15   Temp:  97.8 F (36.6 C)  97.8 F (36.6 C)  TempSrc:  Oral  Oral  SpO2: 98% 96% 98% 97%  Weight:      Height:        Intake/Output Summary (Last 24 hours) at 07/05/2024 1620 Last data filed at 07/05/2024 1553 Gross per 24 hour  Intake 100 ml  Output 0 ml  Net 100 ml   Filed Weights   06/30/24 1301 07/01/24 1320 07/01/24 1708  Weight: 120 kg 122.9 kg 121.3 kg   Examination:  General exam: Appears calm and comfortable  Respiratory system: Clear to auscultation. Respiratory effort normal. Cardiovascular system: S1 & S2 heard, RRR. No JVD, murmurs, rubs, gallops or clicks. No pedal edema.  Right femoral catheter in  place. Gastrointestinal system: Abdomen is nondistended, soft and nontender. No organomegaly or masses felt. Normal bowel sounds heard. Central nervous system: Alert and oriented. No focal neurological deficits. Extremities: Symmetric 5 x 5 power. Skin: No rashes, lesions or ulcers Psychiatry: Judgement and insight appear normal. Mood & affect appropriate.   Data Reviewed: I have personally reviewed following labs and imaging studies  CBC: Recent Labs  Lab 06/30/24 1337 07/01/24 0419 07/04/24 0329  WBC 12.6* 9.8 9.8  NEUTROABS 10.3*  --   --   HGB 10.2* 8.3* 8.4*  HCT 31.7* 24.8* 25.0*  MCV 96.4 93.6 93.3  PLT 150 134* 184   Basic Metabolic Panel: Recent Labs  Lab 06/30/24 1337 07/01/24 0419 07/03/24 0814 07/04/24 0329  NA 137 132* 137 139  K 4.3 3.8 3.9 4.3  CL 94* 96* 98 100  CO2 21* 20* 22 22  GLUCOSE 92 81 82 82  BUN 56* 69* 58* 65*  CREATININE 15.56* 17.48* 16.14* 18.29*  CALCIUM  7.8* 6.6* 6.9* 6.7*  PHOS  --   --  5.1* 6.2*   GFR: Estimated Creatinine Clearance: 6 mL/min (A) (by C-G formula based on SCr of 18.29 mg/dL (H)).  Liver Function Tests: Recent Labs  Lab 06/30/24 1337 07/03/24 0814 07/04/24 0329  AST 31  --   --   ALT 18  --   --  ALKPHOS 59  --   --   BILITOT 0.6  --   --   PROT 7.5  --   --   ALBUMIN 3.1* 2.6* 2.5*   Coagulation Profile: Recent Labs  Lab 06/30/24 1337  INR 1.3*   Sepsis Labs: Recent Labs  Lab 06/30/24 1539  LATICACIDVEN 1.2   Recent Results (from the past 240 hours)  MRSA Next Gen by PCR, Nasal     Status: None   Collection Time: 06/26/24  3:19 PM   Specimen: Nasal Mucosa; Nasal Swab  Result Value Ref Range Status   MRSA by PCR Next Gen NOT DETECTED NOT DETECTED Final    Comment: (NOTE) The GeneXpert MRSA Assay (FDA approved for NASAL specimens only), is one component of a comprehensive MRSA colonization surveillance program. It is not intended to diagnose MRSA infection nor to guide or monitor treatment  for MRSA infections. Test performance is not FDA approved in patients less than 52 years old. Performed at [facility], [address] [place], [place] [address]   Culture, blood (Routine x 2)     Status: Abnormal   Collection Time: 06/30/24  1:37 PM   Specimen: BLOOD  Result Value Ref Range Status   Specimen Description   Final    BLOOD RIGHT ANTECUBITAL Performed at [facility], [address] [place], [place] [address]    Special Requests   Final    BOTTLES DRAWN AEROBIC AND ANAEROBIC Blood Culture adequate volume Performed at [facility], [address], [place], [place] [address]    Culture  Setup Time   Final    GRAM NEGATIVE RODS IN BOTH AEROBIC AND ANAEROBIC BOTTLES CRITICAL RESULT CALLED TO, READ BACK BY AND VERIFIED WITH: MADISON HUNT PHARM.D 06/30/24 1004 KG Performed at [facility], [address] [place], [place] [address]    Culture SERRATIA MARCESCENS (A)  Final   Report Status 07/04/2024 FINAL  Final   Organism ID, Bacteria SERRATIA MARCESCENS  Final      Susceptibility   Serratia marcescens - MIC*    CEFEPIME <=0.12 SENSITIVE Sensitive     ERTAPENEM <=0.12 SENSITIVE Sensitive     CEFTRIAXONE  <=0.25 SENSITIVE Sensitive     CIPROFLOXACIN 0.12 SENSITIVE Sensitive     GENTAMICIN <=1 SENSITIVE Sensitive     MEROPENEM <=0.25 SENSITIVE Sensitive     TRIMETH/SULFA <=20 SENSITIVE Sensitive     * SERRATIA MARCESCENS  Blood Culture ID Panel (Reflexed)     Status: Abnormal   Collection Time: 06/30/24  1:37 PM  Result Value Ref Range Status   Enterococcus faecalis NOT DETECTED NOT DETECTED Final   Enterococcus Faecium NOT DETECTED NOT DETECTED Final   Listeria monocytogenes NOT DETECTED NOT DETECTED Final   Staphylococcus species NOT DETECTED NOT DETECTED Final   Staphylococcus aureus (BCID) NOT DETECTED NOT DETECTED Final   Staphylococcus epidermidis NOT DETECTED NOT DETECTED Final   Staphylococcus lugdunensis NOT  DETECTED NOT DETECTED Final   Streptococcus species NOT DETECTED NOT DETECTED Final   Streptococcus agalactiae NOT DETECTED NOT DETECTED Final   Streptococcus pneumoniae NOT DETECTED NOT DETECTED Final   Streptococcus pyogenes NOT DETECTED NOT DETECTED Final   A.calcoaceticus-baumannii NOT DETECTED NOT DETECTED Final   Bacteroides fragilis NOT DETECTED NOT DETECTED Final   Enterobacterales DETECTED (A) NOT DETECTED Final    Comment: Enterobacterales represent a large order of gram negative bacteria, not a single organism. CRITICAL RESULT CALLED TO, READ BACK BY AND VERIFIED WITH: MADISON HUNT PHARM.D  07/01/24 1004 KG    Enterobacter cloacae complex NOT DETECTED NOT DETECTED Final   Escherichia coli NOT DETECTED NOT DETECTED Final   Klebsiella aerogenes NOT DETECTED NOT DETECTED Final   Klebsiella oxytoca NOT DETECTED NOT DETECTED Final   Klebsiella pneumoniae NOT DETECTED NOT DETECTED Final   Proteus species NOT DETECTED NOT DETECTED Final   Salmonella species NOT DETECTED NOT DETECTED Final   Serratia marcescens DETECTED (A) NOT DETECTED Final    Comment: CRITICAL RESULT CALLED TO, READ BACK BY AND VERIFIED WITH: MADISON HUNT PHARM.D 07/01/24 1004 KG    Haemophilus influenzae NOT DETECTED NOT DETECTED Final   Neisseria meningitidis NOT DETECTED NOT DETECTED Final   Pseudomonas aeruginosa NOT DETECTED NOT DETECTED Final   Stenotrophomonas maltophilia NOT DETECTED NOT DETECTED Final   Candida albicans NOT DETECTED NOT DETECTED Final   Candida auris NOT DETECTED NOT DETECTED Final   Candida glabrata NOT DETECTED NOT DETECTED Final   Candida krusei NOT DETECTED NOT DETECTED Final   Candida parapsilosis NOT DETECTED NOT DETECTED Final   Candida tropicalis NOT DETECTED NOT DETECTED Final   Cryptococcus neoformans/gattii NOT DETECTED NOT DETECTED Final   CTX-M ESBL NOT DETECTED NOT DETECTED Final   Carbapenem resistance IMP NOT DETECTED NOT DETECTED Final   Carbapenem resistance KPC NOT  DETECTED NOT DETECTED Final   Carbapenem resistance NDM NOT DETECTED NOT DETECTED Final   Carbapenem resist OXA 48 LIKE NOT DETECTED NOT DETECTED Final   Carbapenem resistance VIM NOT DETECTED NOT DETECTED Final    Comment: Performed at [facility], [address] [place], [place] [address]  Culture, blood (Routine x 2)     Status: Abnormal   Collection Time: 06/30/24  2:59 PM   Specimen: BLOOD RIGHT FOREARM  Result Value Ref Range Status   Specimen Description   Final    BLOOD RIGHT FOREARM Performed at [facility], [address] [place], [place] [address]    Special Requests   Final    BOTTLES DRAWN AEROBIC AND ANAEROBIC Blood Culture adequate volume Performed at [facility], [address] [place], [place] [address]    Culture  Setup Time   Final    GRAM NEGATIVE RODS IN BOTH AEROBIC AND ANAEROBIC BOTTLES CRITICAL VALUE NOTED.  VALUE IS CONSISTENT WITH PREVIOUSLY REPORTED AND CALLED VALUE. Performed at [facility], [address] [place], [place] [address]    Culture (A)  Final    SERRATIA MARCESCENS SUSCEPTIBILITIES PERFORMED ON PREVIOUS CULTURE WITHIN THE LAST 5 DAYS. Performed at [facility], [address], [place], [place] [address]    Report Status 07/04/2024 FINAL  Final  Resp panel by RT-PCR (RSV, Flu A&B, Covid) Anterior Nasal Swab     Status: None   Collection Time: 06/30/24  3:39 PM   Specimen: Anterior Nasal Swab  Result Value Ref Range Status   SARS Coronavirus 2 by RT PCR NEGATIVE NEGATIVE Final    Comment: (NOTE) SARS-CoV-2 target nucleic acids are NOT DETECTED.  The SARS-CoV-2 RNA is generally detectable in upper respiratory specimens during the acute phase of infection. The lowest concentration of SARS-CoV-2 viral copies this assay can detect is 138 copies/mL. A negative result does not preclude SARS-Cov-2 infection and should not be used as the sole basis for treatment or other patient management  decisions. A negative result may occur with  improper specimen collection/handling, submission of specimen other than nasopharyngeal swab, presence of viral mutation(s) within the areas targeted by this  assay, and inadequate number of viral copies(<138 copies/mL). A negative result must be combined with clinical observations, patient history, and epidemiological information. The expected result is Negative.  Fact Sheet for Patients:  bloggercourse.com  Fact Sheet for Healthcare Providers:  seriousbroker.it  This test is no t yet approved or cleared by the [place]  FDA and  has been authorized for detection and/or diagnosis of SARS-CoV-2 by FDA under an Emergency Use Authorization (EUA). This EUA will remain  in effect (meaning this test can be used) for the duration of the COVID-19 declaration under Section 564(b)(1) of the Act, 21 U.S.C.section 360bbb-3(b)(1), unless the authorization is terminated  or revoked sooner.       Influenza A by PCR NEGATIVE NEGATIVE Final   Influenza B by PCR NEGATIVE NEGATIVE Final    Comment: (NOTE) The Xpert Xpress SARS-CoV-2/FLU/RSV plus assay is intended as an aid in the diagnosis of influenza from Nasopharyngeal swab specimens and should not be used as a sole basis for treatment. Nasal washings and aspirates are unacceptable for Xpert Xpress SARS-CoV-2/FLU/RSV testing.  Fact Sheet for Patients: bloggercourse.com  Fact Sheet for Healthcare Providers: seriousbroker.it  This test is not yet approved or cleared by the [place]  FDA and has been authorized for detection and/or diagnosis of SARS-CoV-2 by FDA under an Emergency Use Authorization (EUA). This EUA will remain in effect (meaning this test can be used) for the duration of the COVID-19 declaration under Section 564(b)(1) of the Act, 21 U.S.C. section 360bbb-3(b)(1), unless the  authorization is terminated or revoked.     Resp Syncytial Virus by PCR NEGATIVE NEGATIVE Final    Comment: (NOTE) Fact Sheet for Patients: bloggercourse.com  Fact Sheet for Healthcare Providers: seriousbroker.it  This test is not yet approved or cleared by the [place]  FDA and has been authorized for detection and/or diagnosis of SARS-CoV-2 by FDA under an Emergency Use Authorization (EUA). This EUA will remain in effect (meaning this test can be used) for the duration of the COVID-19 declaration under Section 564(b)(1) of the Act, 21 U.S.C. section 360bbb-3(b)(1), unless the authorization is terminated or revoked.  Performed at [facility], [address] [place], [place] [address]   Respiratory (~20 pathogens) panel by PCR     Status: None   Collection Time: 07/01/24  7:46 AM   Specimen: Nasopharyngeal Swab; Respiratory  Result Value Ref Range Status   Adenovirus NOT DETECTED NOT DETECTED Final   Coronavirus 229E NOT DETECTED NOT DETECTED Final    Comment: (NOTE) The Coronavirus on the Respiratory Panel, DOES NOT test for the novel  Coronavirus (2019 nCoV)    Coronavirus HKU1 NOT DETECTED NOT DETECTED Final   Coronavirus NL63 NOT DETECTED NOT DETECTED Final   Coronavirus OC43 NOT DETECTED NOT DETECTED Final   Metapneumovirus NOT DETECTED NOT DETECTED Final   Rhinovirus / Enterovirus NOT DETECTED NOT DETECTED Final   Influenza A NOT DETECTED NOT DETECTED Final   Influenza B NOT DETECTED NOT DETECTED Final   Parainfluenza Virus 1 NOT DETECTED NOT DETECTED Final   Parainfluenza Virus 2 NOT DETECTED NOT DETECTED Final   Parainfluenza Virus 3 NOT DETECTED NOT DETECTED Final   Parainfluenza Virus 4 NOT DETECTED NOT DETECTED Final   Respiratory Syncytial Virus NOT DETECTED NOT DETECTED Final   Bordetella pertussis NOT DETECTED NOT DETECTED Final   Bordetella Parapertussis NOT DETECTED NOT DETECTED Final    Chlamydophila pneumoniae NOT DETECTED NOT DETECTED Final   Mycoplasma pneumoniae NOT DETECTED NOT DETECTED Final  Comment: Performed at [facility], [address], [place], [place] [address]  MRSA Next Gen by PCR, Nasal     Status: None   Collection Time: 07/01/24  7:46 AM   Specimen: Nasal Mucosa; Nasal Swab  Result Value Ref Range Status   MRSA by PCR Next Gen NOT DETECTED NOT DETECTED Final    Comment: (NOTE) The GeneXpert MRSA Assay (FDA approved for NASAL specimens only), is one component of a comprehensive MRSA colonization surveillance program. It is not intended to diagnose MRSA infection nor to guide or monitor treatment for MRSA infections. Test performance is not FDA approved in patients less than 69 years old. Performed at [facility], [address] [place], [place] [address]   Cath Tip Culture     Status: Abnormal   Collection Time: 07/01/24  5:56 PM   Specimen: Catheter Tip; Other  Result Value Ref Range Status   Specimen Description   Final    CATH TIP Performed at [facility], [address] [place], [place] [address]    Special Requests   Final    NONE Performed at [facility], [address] [place], [place] [address]    Culture 10,000 COLONIES/mL SERRATIA MARCESCENS (A)  Final   Report Status 07/04/2024 FINAL  Final   Organism ID, Bacteria SERRATIA MARCESCENS (A)  Final      Susceptibility   Serratia marcescens - MIC*    CEFEPIME <=0.12 SENSITIVE Sensitive     ERTAPENEM <=0.12 SENSITIVE Sensitive     CEFTRIAXONE  <=0.25 SENSITIVE Sensitive     CIPROFLOXACIN 0.12 SENSITIVE Sensitive     GENTAMICIN <=1 SENSITIVE Sensitive     MEROPENEM <=0.25 SENSITIVE Sensitive     TRIMETH/SULFA <=20 SENSITIVE Sensitive     * 10,000 COLONIES/mL SERRATIA MARCESCENS  Culture, blood (Routine X 2) w Reflex to ID Panel     Status: None (Preliminary result)   Collection Time: 07/03/24  9:24 AM   Specimen: BLOOD  Result Value Ref  Range Status   Specimen Description BLOOD BLOOD RIGHT HAND  Final   Special Requests   Final    BOTTLES DRAWN AEROBIC AND ANAEROBIC Blood Culture adequate volume   Culture   Final    NO GROWTH 2 DAYS Performed at [facility], [address], [place], [place] [address]    Report Status PENDING  Incomplete  Culture, blood (Routine X 2) w Reflex to ID Panel     Status: None (Preliminary result)   Collection Time: 07/03/24  9:24 AM   Specimen: BLOOD  Result Value Ref Range Status   Specimen Description BLOOD BLOOD RIGHT ARM  Final   Special Requests   Final    BOTTLES DRAWN AEROBIC AND ANAEROBIC Blood Culture adequate volume   Culture   Final    NO GROWTH 2 DAYS Performed at [facility], [address] [place], [place] [address]    Report Status PENDING  Incomplete    Radiology Studies: PERIPHERAL VASCULAR CATHETERIZATION Result Date: 07/05/2024 See surgical note for result.  ECHOCARDIOGRAM COMPLETE Result Date: 07/04/2024    ECHOCARDIOGRAM REPORT   Patient Name:   Joseph Gilmore Date of Exam: 07/04/2024 Medical Rec #:  [id number]        Height:       68.0 in Accession #:    [id number]       Weight:       267.4 lb Date of Birth:  08-17-1971  BSA:          2.312 m Patient Age:    53 years         BP:           132/75 mmHg Patient Gender: M                HR:           73 bpm. Exam Location:  [facility] Procedure: 2D Echo, Cardiac Doppler and Color Doppler (Both Spectral and Color            Flow Doppler were utilized during procedure). Indications:     Bacteremia R78.81  History:         Patient has prior history of Echocardiogram examinations, most                  recent 04/06/2020. Signs/Symptoms:Murmur; Risk                  Factors:Hypertension.  Sonographer:     Christopher Furnace Referring Phys:  [id number] DONALD BERLIN Diagnosing Phys: Evalene Lunger MD  Sonographer Comments: Technically challenging study due to limited acoustic windows, no apical window and no  subcostal window. IMPRESSIONS  1. Left ventricular ejection fraction, by estimation, is 60 to 65%. The left ventricle has normal function. The left ventricle has no regional wall motion abnormalities. There is mild left ventricular hypertrophy. Left ventricular diastolic parameters are indeterminate.  2. Right ventricular systolic function is normal. The right ventricular size is normal.  3. The mitral valve is normal in structure. No evidence of mitral valve regurgitation. No evidence of mitral stenosis.  4. The aortic valve is normal in structure. Aortic valve regurgitation is not visualized. No aortic stenosis is present.  5. The inferior vena cava is normal in size with greater than 50% respiratory variability, suggesting right atrial pressure of 3 mmHg.  6. Valves not well visualized, grossly no vegetation. FINDINGS  Left Ventricle: Left ventricular ejection fraction, by estimation, is 60 to 65%. The left ventricle has normal function. The left ventricle has no regional wall motion abnormalities. Strain was performed and the global longitudinal strain is indeterminate. The left ventricular internal cavity size was normal in size. There is mild left ventricular hypertrophy. Left ventricular diastolic parameters are indeterminate. Right Ventricle: The right ventricular size is normal. No increase in right ventricular wall thickness. Right ventricular systolic function is normal. Left Atrium: Left atrial size was normal in size. Right Atrium: Right atrial size was normal in size. Pericardium: There is no evidence of pericardial effusion. Mitral Valve: The mitral valve is normal in structure. No evidence of mitral valve regurgitation. No evidence of mitral valve stenosis. Tricuspid Valve: The tricuspid valve is normal in structure. Tricuspid valve regurgitation is not demonstrated. No evidence of tricuspid stenosis. Aortic Valve: The aortic valve is normal in structure. Aortic valve regurgitation is not visualized.  No aortic stenosis is present. Pulmonic Valve: The pulmonic valve was normal in structure. Pulmonic valve regurgitation is not visualized. No evidence of pulmonic stenosis. Aorta: The aortic root is normal in size and structure. Venous: The inferior vena cava is normal in size with greater than 50% respiratory variability, suggesting right atrial pressure of 3 mmHg. IAS/Shunts: No atrial level shunt detected by color flow Doppler. Additional Comments: 3D was performed not requiring image post processing on an independent workstation and was indeterminate.  LEFT VENTRICLE PLAX 2D LVIDd:         3.58 cm LVIDs:  2.24 cm LV PW:         1.21 cm LV IVS:        1.32 cm LVOT diam:     2.10 cm LVOT Area:     3.46 cm  LEFT ATRIUM         Index LA diam:    3.50 cm 1.51 cm/m   AORTA Ao Root diam: 2.80 cm  SHUNTS Systemic Diam: 2.10 cm Evalene Lunger MD Electronically signed by Evalene Lunger MD Signature Date/Time: 07/04/2024/12:18:33 PM    Final    Scheduled Meds:  heparin   5,000 Units Subcutaneous Q8H   midodrine  10 mg Oral TID WC   multivitamin  1 tablet Oral QHS   tamsulosin   0.4 mg Oral Daily   Continuous Infusions:  ceFEPime (MAXIPIME) IV 1 g (07/04/24 1701)   [START ON 07/06/2024] ceFEPime (MAXIPIME) IV      LOS: 5 days   Time spent: 35 minutes  Dr. Sherre [facility] If 7PM-7AM, please contact night-coverage 07/05/2024, 4:20 PM

## 2024-07-05 NOTE — Progress Notes (Addendum)
 [facility]  ROUNDING NOTE   Subjective:   Joseph Gilmore is a 53 y.o. male with past medical history of autism, hypertension, OCD, and end stage renal disease on hemodialysis. Patient presents to ED with shortness of breath and has been admitted for Shortness of breath [R06.02] Sepsis (HCC) [A41.9] Fever, unspecified fever cause [R50.9]  Patient is known to our practice and receives outpatient dialysis treatments at [facility] on a TTS schedule, supervised by Dr Douglas.    Update: Patient seen resting quietly No family present Remains on room air, denies shortness of breath Currently n.p.o. for vascular procedure.  Objective:  Vital signs in last 24 hours:  Temp:  [97.6 F (36.4 C)-98.4 F (36.9 C)] 97.6 F (36.4 C) (11/07 1147) Pulse Rate:  [62-70] 63 (11/07 1147) Resp:  [16-18] 16 (11/07 1147) BP: (108-120)/(55-107) 118/107 (11/07 1147) SpO2:  [97 %-100 %] 100 % (11/07 1337)  Weight change:  Filed Weights   06/30/24 1301 07/01/24 1320 07/01/24 1708  Weight: 120 kg 122.9 kg 121.3 kg    Intake/Output: I/O last 3 completed shifts: In: 380 [P.O.:280; IV Piggyback:100] Out: -    Intake/Output this shift:  No intake/output data recorded.  Physical Exam: General: NAD  Head: Normocephalic, atraumatic. Moist oral mucosal membranes  Eyes: Anicteric  Lungs:  Diminished, normal effort  Heart: Regular rate and rhythm  Abdomen:  Soft, nontender  Extremities:  Trace peripheral edema.  Neurologic: Awake, alert, conversant  Skin: Warm,dry, no rash  Access: Removed on 11/3    Basic Metabolic Panel: Recent Labs  Lab 06/30/24 1337 07/01/24 0419 07/03/24 0814 07/04/24 0329  NA 137 132* 137 139  K 4.3 3.8 3.9 4.3  CL 94* 96* 98 100  CO2 21* 20* 22 22  GLUCOSE 92 81 82 82  BUN 56* 69* 58* 65*  CREATININE 15.56* 17.48* 16.14* 18.29*  CALCIUM  7.8* 6.6* 6.9* 6.7*  PHOS  --   --  5.1* 6.2*    Liver Function Tests: Recent Labs  Lab 06/30/24 1337  07/03/24 0814 07/04/24 0329  AST 31  --   --   ALT 18  --   --   ALKPHOS 59  --   --   BILITOT 0.6  --   --   PROT 7.5  --   --   ALBUMIN 3.1* 2.6* 2.5*   No results for input(s): LIPASE, AMYLASE in the last 168 hours. No results for input(s): AMMONIA in the last 168 hours.  CBC: Recent Labs  Lab 06/30/24 1337 07/01/24 0419 07/04/24 0329  WBC 12.6* 9.8 9.8  NEUTROABS 10.3*  --   --   HGB 10.2* 8.3* 8.4*  HCT 31.7* 24.8* 25.0*  MCV 96.4 93.6 93.3  PLT 150 134* 184    Cardiac Enzymes: No results for input(s): CKTOTAL, CKMB, CKMBINDEX, TROPONINI in the last 168 hours.  BNP: Invalid input(s): POCBNP  CBG: No results for input(s): GLUCAP in the last 168 hours.   Microbiology: Results for orders placed or performed during the hospital encounter of 06/30/24  Culture, blood (Routine x 2)     Status: Abnormal   Collection Time: 06/30/24  1:37 PM   Specimen: BLOOD  Result Value Ref Range Status   Specimen Description   Final    BLOOD RIGHT ANTECUBITAL Performed at [facility], [address], [place], [place] [address]    Special Requests   Final    BOTTLES DRAWN AEROBIC AND ANAEROBIC Blood Culture adequate volume Performed  at [facility], [address] [place], [place] [address]    Culture  Setup Time   Final    GRAM NEGATIVE RODS IN BOTH AEROBIC AND ANAEROBIC BOTTLES CRITICAL RESULT CALLED TO, READ BACK BY AND VERIFIED WITH: MADISON HUNT PHARM.D 06/30/24 1004 KG Performed at [facility], [address] [place], [place] [address]    Culture SERRATIA MARCESCENS (A)  Final   Report Status 07/04/2024 FINAL  Final   Organism ID, Bacteria SERRATIA MARCESCENS  Final      Susceptibility   Serratia marcescens - MIC*    CEFEPIME <=0.12 SENSITIVE Sensitive     ERTAPENEM <=0.12 SENSITIVE Sensitive     CEFTRIAXONE  <=0.25 SENSITIVE Sensitive     CIPROFLOXACIN 0.12 SENSITIVE Sensitive     GENTAMICIN <=1 SENSITIVE  Sensitive     MEROPENEM <=0.25 SENSITIVE Sensitive     TRIMETH/SULFA <=20 SENSITIVE Sensitive     * SERRATIA MARCESCENS  Blood Culture ID Panel (Reflexed)     Status: Abnormal   Collection Time: 06/30/24  1:37 PM  Result Value Ref Range Status   Enterococcus faecalis NOT DETECTED NOT DETECTED Final   Enterococcus Faecium NOT DETECTED NOT DETECTED Final   Listeria monocytogenes NOT DETECTED NOT DETECTED Final   Staphylococcus species NOT DETECTED NOT DETECTED Final   Staphylococcus aureus (BCID) NOT DETECTED NOT DETECTED Final   Staphylococcus epidermidis NOT DETECTED NOT DETECTED Final   Staphylococcus lugdunensis NOT DETECTED NOT DETECTED Final   Streptococcus species NOT DETECTED NOT DETECTED Final   Streptococcus agalactiae NOT DETECTED NOT DETECTED Final   Streptococcus pneumoniae NOT DETECTED NOT DETECTED Final   Streptococcus pyogenes NOT DETECTED NOT DETECTED Final   A.calcoaceticus-baumannii NOT DETECTED NOT DETECTED Final   Bacteroides fragilis NOT DETECTED NOT DETECTED Final   Enterobacterales DETECTED (A) NOT DETECTED Final    Comment: Enterobacterales represent a large order of gram negative bacteria, not a single organism. CRITICAL RESULT CALLED TO, READ BACK BY AND VERIFIED WITH: MADISON HUNT PHARM.D 07/01/24 1004 KG    Enterobacter cloacae complex NOT DETECTED NOT DETECTED Final   Escherichia coli NOT DETECTED NOT DETECTED Final   Klebsiella aerogenes NOT DETECTED NOT DETECTED Final   Klebsiella oxytoca NOT DETECTED NOT DETECTED Final   Klebsiella pneumoniae NOT DETECTED NOT DETECTED Final   Proteus species NOT DETECTED NOT DETECTED Final   Salmonella species NOT DETECTED NOT DETECTED Final   Serratia marcescens DETECTED (A) NOT DETECTED Final    Comment: CRITICAL RESULT CALLED TO, READ BACK BY AND VERIFIED WITH: MADISON HUNT PHARM.D 07/01/24 1004 KG    Haemophilus influenzae NOT DETECTED NOT DETECTED Final   Neisseria meningitidis NOT DETECTED NOT DETECTED Final    Pseudomonas aeruginosa NOT DETECTED NOT DETECTED Final   Stenotrophomonas maltophilia NOT DETECTED NOT DETECTED Final   Candida albicans NOT DETECTED NOT DETECTED Final   Candida auris NOT DETECTED NOT DETECTED Final   Candida glabrata NOT DETECTED NOT DETECTED Final   Candida krusei NOT DETECTED NOT DETECTED Final   Candida parapsilosis NOT DETECTED NOT DETECTED Final   Candida tropicalis NOT DETECTED NOT DETECTED Final   Cryptococcus neoformans/gattii NOT DETECTED NOT DETECTED Final   CTX-M ESBL NOT DETECTED NOT DETECTED Final   Carbapenem resistance IMP NOT DETECTED NOT DETECTED Final   Carbapenem resistance KPC NOT DETECTED NOT DETECTED Final   Carbapenem resistance NDM NOT DETECTED NOT DETECTED Final   Carbapenem resist OXA 48 LIKE NOT DETECTED NOT DETECTED Final   Carbapenem resistance VIM NOT DETECTED NOT  DETECTED Final    Comment: Performed at [facility], [address] [place], [place] [address]  Culture, blood (Routine x 2)     Status: Abnormal   Collection Time: 06/30/24  2:59 PM   Specimen: BLOOD RIGHT FOREARM  Result Value Ref Range Status   Specimen Description   Final    BLOOD RIGHT FOREARM Performed at [facility], [address] [place], [place] [address]    Special Requests   Final    BOTTLES DRAWN AEROBIC AND ANAEROBIC Blood Culture adequate volume Performed at [facility], [address] [place], [place] [address]    Culture  Setup Time   Final    GRAM NEGATIVE RODS IN BOTH AEROBIC AND ANAEROBIC BOTTLES CRITICAL VALUE NOTED.  VALUE IS CONSISTENT WITH PREVIOUSLY REPORTED AND CALLED VALUE. Performed at [facility], [address] [place], [place] [address]    Culture (A)  Final    SERRATIA MARCESCENS SUSCEPTIBILITIES PERFORMED ON PREVIOUS CULTURE WITHIN THE LAST 5 DAYS. Performed at [facility], [address] [place], [place] [address]    Report Status 07/04/2024 FINAL  Final  Resp panel by RT-PCR (RSV,  Flu A&B, Covid) Anterior Nasal Swab     Status: None   Collection Time: 06/30/24  3:39 PM   Specimen: Anterior Nasal Swab  Result Value Ref Range Status   SARS Coronavirus 2 by RT PCR NEGATIVE NEGATIVE Final    Comment: (NOTE) SARS-CoV-2 target nucleic acids are NOT DETECTED.  The SARS-CoV-2 RNA is generally detectable in upper respiratory specimens during the acute phase of infection. The lowest concentration of SARS-CoV-2 viral copies this assay can detect is 138 copies/mL. A negative result does not preclude SARS-Cov-2 infection and should not be used as the sole basis for treatment or other patient management decisions. A negative result may occur with  improper specimen collection/handling, submission of specimen other than nasopharyngeal swab, presence of viral mutation(s) within the areas targeted by this assay, and inadequate number of viral copies(<138 copies/mL). A negative result must be combined with clinical observations, patient history, and epidemiological information. The expected result is Negative.  Fact Sheet for Patients:  bloggercourse.com  Fact Sheet for Healthcare Providers:  seriousbroker.it  This test is no t yet approved or cleared by the [place]  FDA and  has been authorized for detection and/or diagnosis of SARS-CoV-2 by FDA under an Emergency Use Authorization (EUA). This EUA will remain  in effect (meaning this test can be used) for the duration of the COVID-19 declaration under Section 564(b)(1) of the Act, 21 U.S.C.section 360bbb-3(b)(1), unless the authorization is terminated  or revoked sooner.       Influenza A by PCR NEGATIVE NEGATIVE Final   Influenza B by PCR NEGATIVE NEGATIVE Final    Comment: (NOTE) The Xpert Xpress SARS-CoV-2/FLU/RSV plus assay is intended as an aid in the diagnosis of influenza from Nasopharyngeal swab specimens and should not be used as a sole basis for  treatment. Nasal washings and aspirates are unacceptable for Xpert Xpress SARS-CoV-2/FLU/RSV testing.  Fact Sheet for Patients: bloggercourse.com  Fact Sheet for Healthcare Providers: seriousbroker.it  This test is not yet approved or cleared by the [place]  FDA and has been authorized for detection and/or diagnosis of SARS-CoV-2 by FDA under an Emergency Use Authorization (EUA). This EUA will remain in effect (meaning this test can be used) for the duration of the COVID-19 declaration under Section 564(b)(1) of the Act, 21 U.S.C. section  360bbb-3(b)(1), unless the authorization is terminated or revoked.     Resp Syncytial Virus by PCR NEGATIVE NEGATIVE Final    Comment: (NOTE) Fact Sheet for Patients: bloggercourse.com  Fact Sheet for Healthcare Providers: seriousbroker.it  This test is not yet approved or cleared by the [place]  FDA and has been authorized for detection and/or diagnosis of SARS-CoV-2 by FDA under an Emergency Use Authorization (EUA). This EUA will remain in effect (meaning this test can be used) for the duration of the COVID-19 declaration under Section 564(b)(1) of the Act, 21 U.S.C. section 360bbb-3(b)(1), unless the authorization is terminated or revoked.  Performed at [facility], [address] [place], [place] [address]   Respiratory (~20 pathogens) panel by PCR     Status: None   Collection Time: 07/01/24  7:46 AM   Specimen: Nasopharyngeal Swab; Respiratory  Result Value Ref Range Status   Adenovirus NOT DETECTED NOT DETECTED Final   Coronavirus 229E NOT DETECTED NOT DETECTED Final    Comment: (NOTE) The Coronavirus on the Respiratory Panel, DOES NOT test for the novel  Coronavirus (2019 nCoV)    Coronavirus HKU1 NOT DETECTED NOT DETECTED Final   Coronavirus NL63 NOT DETECTED NOT DETECTED Final   Coronavirus OC43 NOT  DETECTED NOT DETECTED Final   Metapneumovirus NOT DETECTED NOT DETECTED Final   Rhinovirus / Enterovirus NOT DETECTED NOT DETECTED Final   Influenza A NOT DETECTED NOT DETECTED Final   Influenza B NOT DETECTED NOT DETECTED Final   Parainfluenza Virus 1 NOT DETECTED NOT DETECTED Final   Parainfluenza Virus 2 NOT DETECTED NOT DETECTED Final   Parainfluenza Virus 3 NOT DETECTED NOT DETECTED Final   Parainfluenza Virus 4 NOT DETECTED NOT DETECTED Final   Respiratory Syncytial Virus NOT DETECTED NOT DETECTED Final   Bordetella pertussis NOT DETECTED NOT DETECTED Final   Bordetella Parapertussis NOT DETECTED NOT DETECTED Final   Chlamydophila pneumoniae NOT DETECTED NOT DETECTED Final   Mycoplasma pneumoniae NOT DETECTED NOT DETECTED Final    Comment: Performed at [facility], [address] [place], [place] [address]  MRSA Next Gen by PCR, Nasal     Status: None   Collection Time: 07/01/24  7:46 AM   Specimen: Nasal Mucosa; Nasal Swab  Result Value Ref Range Status   MRSA by PCR Next Gen NOT DETECTED NOT DETECTED Final    Comment: (NOTE) The GeneXpert MRSA Assay (FDA approved for NASAL specimens only), is one component of a comprehensive MRSA colonization surveillance program. It is not intended to diagnose MRSA infection nor to guide or monitor treatment for MRSA infections. Test performance is not FDA approved in patients less than 51 years old. Performed at [facility], [address], [place], [place] [address]   Cath Tip Culture     Status: Abnormal   Collection Time: 07/01/24  5:56 PM   Specimen: Catheter Tip; Other  Result Value Ref Range Status   Specimen Description   Final    CATH TIP Performed at [facility], [address] [place], [place] [address]    Special Requests   Final    NONE Performed at [facility], [address] [place], [place] [address]    Culture 10,000 COLONIES/mL SERRATIA MARCESCENS (A)  Final   Report  Status 07/04/2024 FINAL  Final   Organism ID, Bacteria SERRATIA MARCESCENS (A)  Final      Susceptibility   Serratia marcescens - MIC*    CEFEPIME <=0.12 SENSITIVE Sensitive  ERTAPENEM <=0.12 SENSITIVE Sensitive     CEFTRIAXONE  <=0.25 SENSITIVE Sensitive     CIPROFLOXACIN 0.12 SENSITIVE Sensitive     GENTAMICIN <=1 SENSITIVE Sensitive     MEROPENEM <=0.25 SENSITIVE Sensitive     TRIMETH/SULFA <=20 SENSITIVE Sensitive     * 10,000 COLONIES/mL SERRATIA MARCESCENS  Culture, blood (Routine X 2) w Reflex to ID Panel     Status: None (Preliminary result)   Collection Time: 07/03/24  9:24 AM   Specimen: BLOOD  Result Value Ref Range Status   Specimen Description BLOOD BLOOD RIGHT HAND  Final   Special Requests   Final    BOTTLES DRAWN AEROBIC AND ANAEROBIC Blood Culture adequate volume   Culture   Final    NO GROWTH 2 DAYS Performed at [facility], [address], [place], [place] [address]    Report Status PENDING  Incomplete  Culture, blood (Routine X 2) w Reflex to ID Panel     Status: None (Preliminary result)   Collection Time: 07/03/24  9:24 AM   Specimen: BLOOD  Result Value Ref Range Status   Specimen Description BLOOD BLOOD RIGHT ARM  Final   Special Requests   Final    BOTTLES DRAWN AEROBIC AND ANAEROBIC Blood Culture adequate volume   Culture   Final    NO GROWTH 2 DAYS Performed at [facility], [address] [place], [place] [address]    Report Status PENDING  Incomplete    Coagulation Studies: No results for input(s): LABPROT, INR in the last 72 hours.   Urinalysis: No results for input(s): COLORURINE, LABSPEC, PHURINE, GLUCOSEU, HGBUR, BILIRUBINUR, KETONESUR, PROTEINUR, UROBILINOGEN, NITRITE, LEUKOCYTESUR in the last 72 hours.  Invalid input(s): APPERANCEUR    Imaging: ECHOCARDIOGRAM COMPLETE Result Date: 07/04/2024    ECHOCARDIOGRAM REPORT   Patient Name:   Joseph Gilmore Date of Exam: 07/04/2024 Medical  Rec #:  [id number]        Height:       68.0 in Accession #:    [id number]       Weight:       267.4 lb Date of Birth:  1971/01/06        BSA:          2.312 m Patient Age:    53 years         BP:           132/75 mmHg Patient Gender: M                HR:           73 bpm. Exam Location:  [facility] Procedure: 2D Echo, Cardiac Doppler and Color Doppler (Both Spectral and Color            Flow Doppler were utilized during procedure). Indications:     Bacteremia R78.81  History:         Patient has prior history of Echocardiogram examinations, most                  recent 04/06/2020. Signs/Symptoms:Murmur; Risk                  Factors:Hypertension.  Sonographer:     Christopher Furnace Referring Phys:  [id number] DONALD BERLIN Diagnosing Phys: Evalene Lunger MD  Sonographer Comments: Technically challenging study due to limited acoustic windows, no apical window and no subcostal window. IMPRESSIONS  1. Left ventricular ejection fraction, by estimation, is 60 to 65%. The left ventricle has normal  function. The left ventricle has no regional wall motion abnormalities. There is mild left ventricular hypertrophy. Left ventricular diastolic parameters are indeterminate.  2. Right ventricular systolic function is normal. The right ventricular size is normal.  3. The mitral valve is normal in structure. No evidence of mitral valve regurgitation. No evidence of mitral stenosis.  4. The aortic valve is normal in structure. Aortic valve regurgitation is not visualized. No aortic stenosis is present.  5. The inferior vena cava is normal in size with greater than 50% respiratory variability, suggesting right atrial pressure of 3 mmHg.  6. Valves not well visualized, grossly no vegetation. FINDINGS  Left Ventricle: Left ventricular ejection fraction, by estimation, is 60 to 65%. The left ventricle has normal function. The left ventricle has no regional wall motion abnormalities. Strain was performed and the global longitudinal strain is  indeterminate. The left ventricular internal cavity size was normal in size. There is mild left ventricular hypertrophy. Left ventricular diastolic parameters are indeterminate. Right Ventricle: The right ventricular size is normal. No increase in right ventricular wall thickness. Right ventricular systolic function is normal. Left Atrium: Left atrial size was normal in size. Right Atrium: Right atrial size was normal in size. Pericardium: There is no evidence of pericardial effusion. Mitral Valve: The mitral valve is normal in structure. No evidence of mitral valve regurgitation. No evidence of mitral valve stenosis. Tricuspid Valve: The tricuspid valve is normal in structure. Tricuspid valve regurgitation is not demonstrated. No evidence of tricuspid stenosis. Aortic Valve: The aortic valve is normal in structure. Aortic valve regurgitation is not visualized. No aortic stenosis is present. Pulmonic Valve: The pulmonic valve was normal in structure. Pulmonic valve regurgitation is not visualized. No evidence of pulmonic stenosis. Aorta: The aortic root is normal in size and structure. Venous: The inferior vena cava is normal in size with greater than 50% respiratory variability, suggesting right atrial pressure of 3 mmHg. IAS/Shunts: No atrial level shunt detected by color flow Doppler. Additional Comments: 3D was performed not requiring image post processing on an independent workstation and was indeterminate.  LEFT VENTRICLE PLAX 2D LVIDd:         3.58 cm LVIDs:         2.24 cm LV PW:         1.21 cm LV IVS:        1.32 cm LVOT diam:     2.10 cm LVOT Area:     3.46 cm  LEFT ATRIUM         Index LA diam:    3.50 cm 1.51 cm/m   AORTA Ao Root diam: 2.80 cm  SHUNTS Systemic Diam: 2.10 cm Evalene Lunger MD Electronically signed by Evalene Lunger MD Signature Date/Time: 07/04/2024/12:18:33 PM    Final       Medications:    sodium chloride  10 mL/hr at 07/05/24 1339   [START ON 07/06/2024]  ceFAZolin  (ANCEF ) IV 1  g (07/05/24 1344)   [MAR Hold] ceFEPime (MAXIPIME) IV 1 g (07/04/24 1701)   [MAR Hold] ceFEPime (MAXIPIME) IV      [MAR Hold] heparin   5,000 Units Subcutaneous Q8H   [MAR Hold] midodrine  10 mg Oral TID WC   [MAR Hold] multivitamin  1 tablet Oral QHS   [MAR Hold] tamsulosin   0.4 mg Oral Daily   [MAR Hold] acetaminophen  **OR** [MAR Hold] acetaminophen , famotidine , fentaNYL , methylPREDNISolone  (SOLU-MEDROL ) injection, midazolam , midazolam  PF, [MAR Hold] ondansetron  **OR** [MAR Hold] ondansetron  (ZOFRAN ) IV, [MAR Hold] polyethylene glycol  Assessment/  Plan:  Mr. Joseph Gilmore is a 53 y.o.  male with past medical history of autism, hypertension, OCD, and end stage renal disease on hemodialysis. Patient presented for scheduled permcath exchange with vascular surgery. After procedure, patient became agitated with intermittent somnolence. Required Narcan without sufficient response. He was admitted for observation.   End stage renal disease on hemodialysis.  Last dialysis treatment received on 11/3, prior to permcath removal.  Recent blood cultures remain negative for 48 hours.  Vascular to place PermCath today.  Patient will receive dialysis treatment today and tomorrow. Outpatient clinic notified of Cefepime 2g/2g/2g with dialysis until 07/29/24.  Anemia of chronic kidney disease  Lab Results  Component Value Date   HGB 8.4 (L) 07/04/2024   Hemoglobin remained stable.  Will order Retacrit  with dialysis.  3. 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 Most recent hemoglobin A1c is 5.2 on 06/26/24.   Diet controlled   LOS: 5 Joseph Gilmore 11/7/20251:47 PM

## 2024-07-06 DIAGNOSIS — A419 Sepsis, unspecified organism: Secondary | ICD-10-CM | POA: Diagnosis not present

## 2024-07-06 LAB — CBC
HCT: 23 % — ABNORMAL LOW (ref 39.0–52.0)
Hemoglobin: 7.7 g/dL — ABNORMAL LOW (ref 13.0–17.0)
MCH: 31 pg (ref 26.0–34.0)
MCHC: 33.5 g/dL (ref 30.0–36.0)
MCV: 92.7 fL (ref 80.0–100.0)
Platelets: 270 K/uL (ref 150–400)
RBC: 2.48 MIL/uL — ABNORMAL LOW (ref 4.22–5.81)
RDW: 18.2 % — ABNORMAL HIGH (ref 11.5–15.5)
WBC: 10.3 K/uL (ref 4.0–10.5)
nRBC: 0 % (ref 0.0–0.2)

## 2024-07-06 LAB — BASIC METABOLIC PANEL WITH GFR
Anion gap: 20 — ABNORMAL HIGH (ref 5–15)
BUN: 75 mg/dL — ABNORMAL HIGH (ref 6–20)
CO2: 18 mmol/L — ABNORMAL LOW (ref 22–32)
Calcium: 6.7 mg/dL — ABNORMAL LOW (ref 8.9–10.3)
Chloride: 100 mmol/L (ref 98–111)
Creatinine, Ser: 21.99 mg/dL — ABNORMAL HIGH (ref 0.61–1.24)
GFR, Estimated: 2 mL/min — ABNORMAL LOW (ref >60)
Glucose, Bld: 86 mg/dL (ref 70–99)
Potassium: 4.8 mmol/L (ref 3.5–5.1)
Sodium: 138 mmol/L (ref 135–145)

## 2024-07-06 MED ORDER — MIDODRINE HCL 10 MG PO TABS: 10.0000 mg | ORAL_TABLET | Freq: Three times a day (TID) | ORAL | 0 refills | Status: DC

## 2024-07-06 MED ORDER — EPOETIN ALFA-EPBX 10000 UNIT/ML IJ SOLN
INTRAMUSCULAR | Status: AC
  Filled 2024-07-06: qty 1

## 2024-07-06 MED ORDER — EPOETIN ALFA-EPBX 10000 UNIT/ML IJ SOLN
10000.0000 [IU] | INTRAMUSCULAR | Status: DC
  Administered 2024-07-06: 10000 [IU] via INTRAVENOUS

## 2024-07-06 MED ORDER — CEFEPIME IV (FOR PTA / DISCHARGE USE ONLY): 2.0000 g | INTRAVENOUS | 0 refills | Status: AC

## 2024-07-06 MED ORDER — HEPARIN SODIUM (PORCINE) 1000 UNIT/ML IJ SOLN
INTRAMUSCULAR | Status: AC
  Filled 2024-07-06: qty 1

## 2024-07-06 MED ORDER — HEPARIN SODIUM (PORCINE) 1000 UNIT/ML IJ SOLN
INTRAMUSCULAR | Status: AC
Start: 2024-07-06 — End: 2024-07-06
  Filled 2024-07-06: qty 6

## 2024-07-06 NOTE — Discharge Summary (Signed)
 Physician Discharge Summary   Patient: Joseph Gilmore MRN: [id number] DOB: November 19, 1970  Admit date:     06/30/2024  Discharge date: 07/06/24  Discharge Physician: Dr. Sherre   PCP: Joseph GORMAN Dine, MD   Recommendations at discharge:    Cefepime 2 g during dialysis days, TuThSa at 1800. Followup with ID on 07/23/24 at 10:45a.  Nephrology service has confirmed that outpatient clinic has ordered cefepime to administer during dialysis day for the patient. Followup with vascular outpatient for fistula creation Continue HD via right femoral vascath until fistula is working appropriately. Follow up with nephrology outpatient. The above has been updated to patient's paternal aunt (Joseph Gilmore) who takes care of patient and patient's father.  Discharge Diagnoses: Principal Problem:   Sepsis (HCC) Active Problems:   ESRD on dialysis (HCC)   Hypertension   Elevated troponin   BPH (benign prostatic hyperplasia)   Obesity, Class III, BMI 40-49.9 (morbid obesity) (HCC)   Infection of dialysis vascular access   Serratia infection   Gram-negative bacteremia   Bacteremia  Resolved Problems:   * No resolved hospital problems. *  Hospital Course:  Joseph Gilmore is a 53 y.o. male with medical history significant of ESRD on TTS dialysis schedule, autistic with history of hypertension but not on any medication now came to ED with concern of cough and shortness of breath started yesterday after dialysis.   Patient had his HD catheter exchange by vascular surgery on 10/29, last dialysis was on Saturday.   On presentation patient was found to be febrile at 103.2, tachycardic at 117, tachypneic 22 and borderline soft blood pressure at 90/54, CBC with neutrophilic predominant leukocytosis at 12.6, renal function consistent with ESRD, lactic acid normal.  Troponin 122, respiratory panel negative for COVID, influenza and RSV. CXR with no acute abnormality. EKG with sinus tachycardia and nonspecific ST  changes.   ED provider discussed the case with his vascular surgeon and they advised continue broad-spectrum antibiotics and obtain peripheral cultures.  HD catheter site without any obvious sign of infection.  11/3: Afebrile this morning.  Blood cultures growing Serratia marcescens.  Repeat chest x-ray this morning remain negative for any acute abnormality.  Concern of catheter acquired infection, apparently it was a difficult insertion after multiple attempts.  Case was discussed with nephrology and vascular surgery and plan is to remove catheter after dialysis today to give him a holiday.  They will reinsert another catheter in 1 to 2 days to continue with dialysis.  Vancomycin  was discontinued.  11/4: HD catheter was removed yesterday by vascular surgery, preliminary catheter tip cultures negative.  Nephrology will give 2 days of catheter holiday and then they will place another HD catheter to continue dialysis.  Susceptibility results from blood cultures are pending.  11/5: Hemodynamically stable.  Catheter tip culture with similar Serratia marcescens-pansensitive. Repeat blood cultures ordered.  Nephrology will wait for negative blood cultures before replacing a HD catheter.  11/6: Remained hemodynamically stable, repeat blood cultures remain negative.  A new HD catheter to be placed tomorrow morning followed by dialysis.  ID is recommending 4 weeks of cefepime which can be done with dialysis. Echocardiogram normal: estimated EF is 60-65%, valves not well visualized, grossly no vegetation.  11/7: Perma catheter insertion via vascular service.  Attempted 1 session of dialysis, permacatheter did not work well.  11/8: Permacatheter functioned appropriately.  Patient completed full dialysis session.  Patient discharged.  Aunt has been updated.  Assessment and Plan:  *  Sepsis [facility]) Patient met sepsis criteria with tachycardia, tachypnea, fever and leukocytosis. respiratory panel negative for  COVID, influenza and RSV.   Chest x-ray remained negative.  RVP negative Blood cultures growing pansensitive Serratia marcescens-catheter tip with a similar bacteria MRSA PCR negative.  Vancomycin  was discontinued. Repeat blood cultures done on 11/5-negative so far HD catheter was removed by vascular surgery on 10/3, will be replaced tomorrow followed by dialysis Continue with cefepime ID is recommending 4 weeks of antibiotic likely with cefepime as it can be given with dialysis  Repeat blood culture (07/03/24)results: Remain no growth at 3 days (07/06/24).  ESRD on dialysis [facility]) Patient is on TTS schedule, patient had dialysis on Monday and now getting couple of days of holiday. Monitor renal function Nephrology is on board  Elevated troponin Patient denies any chest pain but having some shortness of breath.  Troponin elevated at 122>>68.  EKG with sinus tachycardia and nonspecific ST changes.  Likely secondary to demand ischemia with sepsis.  Hypertension Blood pressure currently within goal Not on any antihypertensives at home, uses midodrine on the day of dialysis. Continue with 3 times daily midodrine for now Continue to monitor  BPH (benign prostatic hyperplasia) Flomax   Gram-negative bacteremia Per ID: Continue with cefepime 2 g on Tuesday, 2 g on Thursday, 2 g on Saturday during HD session at HD outpatient clinic, to complete 4 weeks antibiotic end date on 07/29/2024. Follow-up with infectious disease on 07/23/2024 at 10:45 AM. I informed aunt, Joseph Gilmore at [phone]  Serratia infection Continue with outpatient IV antibiotic during dialysis session. Follow-up with infectious disease as appropriate.  Obesity, Class III, BMI 40-49.9 (morbid obesity) (HCC) Estimated body mass index is 40.66 kg/m as calculated from the following:   Height as of this encounter: 5' 8 (1.727 m).   Weight as of this encounter: 121.3 kg.   This complicates overall care and prognosis.    Pain control - [place]  Controlled Substance Reporting System database was reviewed. and patient was instructed, not to drive, operate heavy machinery, perform activities at heights, swimming or participation in water activities or provide baby-sitting services while on Pain, Sleep and Anxiety Medications; until their outpatient Physician has advised to do so again. Also recommended to not to take more than prescribed Pain, Sleep and Anxiety Medications.  Consultants: Neurology, vascular, pharmacy Procedures performed: Primary catheterization placement on right femoral groin Disposition: Relative's home Diet recommendation:  Discharge Diet Orders (From admission, onward)     Start     Ordered   07/06/24 0000  Diet - low sodium heart healthy        07/06/24 1049           Renal diet DISCHARGE MEDICATION: Allergies as of 07/06/2024       Reactions   Chlorhexidine  Other (See Comments)   Blisters (topical)        Medication List     PAUSE taking these medications    HYDROcodone -acetaminophen  5-325 MG tablet Wait to take this until your doctor or other care provider tells you to start again. Commonly known as: Norco Take 1-2 tablets by mouth every 6 (six) hours as needed for moderate pain (pain score 4-6) or severe pain (pain score 7-10).       STOP taking these medications    MIRCERA IJ       TAKE these medications    calcium  acetate 667 MG capsule Commonly known as: PHOSLO  Take 667 mg by mouth 3 (three) times daily with meals.  ceFEPime IVPB Commonly known as: MAXIPIME Inject 2 g into the vein every Tuesday, Thursday, and Saturday at 6 PM for 23 days. Give Cefepime 2 g IV during every HD session on Tues/Thurs/Sat Indication:  Serratia bacteremia Last Day of Therapy:  07/29/24 (Last dose on 07/27/24) Labs - Once weekly:  CBC/D and CMP Fax weekly lab results  promptly to [phone] & [phone]   dutasteride  0.5 MG capsule Commonly known as:  AVODART  TAKE ONE CAPSULE BY MOUTH DAILY   midodrine 10 MG tablet Commonly known as: PROAMATINE Take 1 tablet (10 mg total) by mouth 3 (three) times daily with meals. What changed:  medication strength how much to take when to take this additional instructions   multivitamin Tabs tablet Take by mouth.   tamsulosin  0.4 MG Caps capsule Commonly known as: FLOMAX  TAKE ONE CAPSULE BY MOUTH DAILY   Vitamin D -3 125 MCG (5000 UT) Tabs Take 5,000 Units by mouth daily.               Home Infusion Instuctions  (From admission, onward)           Start     Ordered   07/06/24 0000  Home infusion instructions       Comments: Inject 2 g into the vein every Tuesday, Thursday, and Saturday at 6 PM for 23 days. Give Cefepime 2 g IV during every HD session on Tues/Thurs/Sat Indication:  Serratia bacteremia Last Day of Therapy:  07/29/24 (Last dose on 07/27/24) Labs - Once weekly:  CBC/D and CMP Fax weekly lab results  promptly to [phone] & [phone]  Question Answer Comment  Instructions Other   Comments To receive at hemodialysis center      07/06/24 1049           Discharge Exam: Filed Weights   07/01/24 1320 07/01/24 1708 07/06/24 0844  Weight: 122.9 kg 121.3 kg 115 kg   Physical Exam Constitutional:      Appearance: He is obese.  HENT:     Head: Normocephalic and atraumatic.     Mouth/Throat:     Mouth: Mucous membranes are moist.  Eyes:     Extraocular Movements: Extraocular movements intact.     Pupils: Pupils are equal, round, and reactive to light.  Cardiovascular:     Rate and Rhythm: Normal rate and regular rhythm.  Pulmonary:     Effort: Pulmonary effort is normal.     Breath sounds: Normal breath sounds. No decreased breath sounds, wheezing, rhonchi or rales.  Chest:     Chest wall: No tenderness.  Abdominal:     General: Bowel sounds are normal.     Palpations: Abdomen is soft.  Genitourinary:    Comments: Right sided femoral permacath  present. Musculoskeletal:        General: Normal range of motion.     Cervical back: Normal range of motion and neck supple.  Skin:    General: Skin is warm and dry.     Capillary Refill: Capillary refill takes less than 2 seconds.  Neurological:     General: No focal deficit present.     Mental Status: He is alert and oriented to person, place, and time.  Psychiatric:     Comments: Behavior consistent with diagnosis of autistic spectrum disorder.   Condition at discharge: good  The results of significant diagnostics from this hospitalization (including imaging, microbiology, ancillary and laboratory) are listed below for reference.   Imaging Studies: PERIPHERAL VASCULAR CATHETERIZATION Result Date:  07/05/2024 See surgical note for result.  ECHOCARDIOGRAM COMPLETE Result Date: 07/04/2024    ECHOCARDIOGRAM REPORT   Patient Name:   ROCH QUACH Gilmore Date of Exam: 07/04/2024 Medical Rec #:  [id number]        Height:       68.0 in Accession #:    [id number]       Weight:       267.4 lb Date of Birth:  1970/11/06        BSA:          2.312 m Patient Age:    53 years         BP:           132/75 mmHg Patient Gender: M                HR:           73 bpm. Exam Location:  [facility] Procedure: 2D Echo, Cardiac Doppler and Color Doppler (Both Spectral and Color            Flow Doppler were utilized during procedure). Indications:     Bacteremia R78.81  History:         Patient has prior history of Echocardiogram examinations, most                  recent 04/06/2020. Signs/Symptoms:Murmur; Risk                  Factors:Hypertension.  Sonographer:     Christopher Furnace Referring Phys:  [id number] DONALD BERLIN Diagnosing Phys: Evalene Lunger MD  Sonographer Comments: Technically challenging study due to limited acoustic windows, no apical window and no subcostal window. IMPRESSIONS  1. Left ventricular ejection fraction, by estimation, is 60 to 65%. The left ventricle has normal function. The left ventricle has no  regional wall motion abnormalities. There is mild left ventricular hypertrophy. Left ventricular diastolic parameters are indeterminate.  2. Right ventricular systolic function is normal. The right ventricular size is normal.  3. The mitral valve is normal in structure. No evidence of mitral valve regurgitation. No evidence of mitral stenosis.  4. The aortic valve is normal in structure. Aortic valve regurgitation is not visualized. No aortic stenosis is present.  5. The inferior vena cava is normal in size with greater than 50% respiratory variability, suggesting right atrial pressure of 3 mmHg.  6. Valves not well visualized, grossly no vegetation. FINDINGS  Left Ventricle: Left ventricular ejection fraction, by estimation, is 60 to 65%. The left ventricle has normal function. The left ventricle has no regional wall motion abnormalities. Strain was performed and the global longitudinal strain is indeterminate. The left ventricular internal cavity size was normal in size. There is mild left ventricular hypertrophy. Left ventricular diastolic parameters are indeterminate. Right Ventricle: The right ventricular size is normal. No increase in right ventricular wall thickness. Right ventricular systolic function is normal. Left Atrium: Left atrial size was normal in size. Right Atrium: Right atrial size was normal in size. Pericardium: There is no evidence of pericardial effusion. Mitral Valve: The mitral valve is normal in structure. No evidence of mitral valve regurgitation. No evidence of mitral valve stenosis. Tricuspid Valve: The tricuspid valve is normal in structure. Tricuspid valve regurgitation is not demonstrated. No evidence of tricuspid stenosis. Aortic Valve: The aortic valve is normal in structure. Aortic valve regurgitation is not visualized. No aortic stenosis is present. Pulmonic Valve: The pulmonic valve was normal in structure. Pulmonic valve regurgitation is not  visualized. No evidence of pulmonic  stenosis. Aorta: The aortic root is normal in size and structure. Venous: The inferior vena cava is normal in size with greater than 50% respiratory variability, suggesting right atrial pressure of 3 mmHg. IAS/Shunts: No atrial level shunt detected by color flow Doppler. Additional Comments: 3D was performed not requiring image post processing on an independent workstation and was indeterminate.  LEFT VENTRICLE PLAX 2D LVIDd:         3.58 cm LVIDs:         2.24 cm LV PW:         1.21 cm LV IVS:        1.32 cm LVOT diam:     2.10 cm LVOT Area:     3.46 cm  LEFT ATRIUM         Index LA diam:    3.50 cm 1.51 cm/m   AORTA Ao Root diam: 2.80 cm  SHUNTS Systemic Diam: 2.10 cm Evalene Lunger MD Electronically signed by Evalene Lunger MD Signature Date/Time: 07/04/2024/12:18:33 PM    Final    PERIPHERAL VASCULAR CATHETERIZATION Result Date: 07/01/2024 See surgical note for result.  X-ray chest PA and lateral Result Date: 07/01/2024 EXAM: 2 VIEW(S) XRAY OF THE CHEST 07/01/2024 01:08:22 AM COMPARISON: 06/30/2024 CLINICAL HISTORY: 141880 SOB (shortness of breath) 141880 SOB (shortness of breath) FINDINGS: LINES, TUBES AND DEVICES: Left dialysis catheter is unchanged. LUNGS AND PLEURA: No focal pulmonary opacity. No pulmonary edema. No pleural effusion. No pneumothorax. HEART AND MEDIASTINUM: No acute abnormality of the cardiac and mediastinal silhouettes. BONES AND SOFT TISSUES: No acute osseous abnormality. IMPRESSION: 1. No acute cardiopulmonary process. 2. Left dialysis catheter is unchanged. Electronically signed by: Franky Crease MD 07/01/2024 01:31 AM EST RP Workstation: [id number]   DG Chest Port 1 View Result Date: 06/30/2024 CLINICAL DATA:  Short of breath, fever, chest pain since yesterday EXAM: PORTABLE CHEST 1 VIEW COMPARISON:  06/26/2024 FINDINGS: Single frontal view of the chest demonstrates stable left internal jugular dialysis catheter. The cardiac silhouette remains enlarged. No acute airspace disease,  effusion, or pneumothorax. No acute bony abnormalities. IMPRESSION: 1. Stable chest, no acute process. Electronically Signed   By: Ozell Daring M.D.   On: 06/30/2024 13:53   DG Chest 1 View Result Date: 06/26/2024 EXAM: 1 VIEW(S) XRAY OF THE CHEST 06/26/2024 03:54:00 PM COMPARISON: None available. CLINICAL HISTORY: AMS (altered mental status) [id number]. AMS FINDINGS: LINES, TUBES AND DEVICES: Left internal jugular central venous catheter in place with tip at cavoatrial junction. Left subclavian and left axillary vascular stents noted. LUNGS AND PLEURA: Low lung volumes. No focal pulmonary opacity. No pulmonary edema. No pleural effusion. No pneumothorax. HEART AND MEDIASTINUM: No acute abnormality of the cardiac and mediastinal silhouettes. BONES AND SOFT TISSUES: No acute osseous abnormality. IMPRESSION: 1. No acute findings. 2. Left internal jugular central venous catheter in place with tip at cavoatrial junction. 3. Left subclavian and left axillary vascular stents noted. Electronically signed by: Dorethia Molt MD 06/26/2024 07:14 PM EDT RP Workstation: [id number]   PERIPHERAL VASCULAR CATHETERIZATION Result Date: 06/26/2024 See surgical note for result.  VAS US  DUPLEX DIALYSIS ACCESS (AVF, AVG) Result Date: 06/19/2024 DIALYSIS ACCESS Patient Name:  JAMASON PECKHAM Gilmore  Date of Exam:   06/19/2024 Medical Rec #: [id number]         Accession #:    [id number] Date of Birth: Aug 09, 1971         Patient Gender: M Patient Age:   81 years Exam Location:  [facility] Procedure:      VAS US  DUPLEX DIALYSIS ACCESS (AVF, AVG) Referring Phys: CORDELLA SHAWL --------------------------------------------------------------------------------  Reason for Exam: Routine follow up. Access Site: Left Upper Extremity. Access Type: Upper arm straight graft. History: 10/04/2023: Left Brachial Axillary AVG Created.           11/28/2023: Mechanical Thrombectomy of Lt Arm Brachial Axillary AVG.          PTA and Stent  placement Lt Arm Brachial Axillary AV Graft venous          Outflow peripheral segment. PTA of the Arterial Anastomosis to 4 mm.           12/26/2023 PTA arterial anastomosis and subclavian stent. Performing Technologist: Donnice Charnley RVT  Examination Guidelines: A complete evaluation includes B-mode imaging, spectral Doppler, color Doppler, and power Doppler as needed of all accessible portions of each vessel. Unilateral testing is considered an integral part of a complete examination. Limited examinations for reoccurring indications may be performed as noted.  Findings:   +--------------------+---------+-----------------+-----------------------------+ AVG                    PSV   Flow Vol (mL/min)          Describe                                 (cm/s)                                                 +--------------------+---------+-----------------+-----------------------------+ Native artery inflow   89           102                                     +--------------------+---------+-----------------+-----------------------------+ Arterial anastomosis    0                               occluded            +--------------------+---------+-----------------+-----------------------------+ Prox graft              0                               occluded            +--------------------+---------+-----------------+-----------------------------+ Mid graft               0                               occluded            +--------------------+---------+-----------------+-----------------------------+ Distal graft            0                               occluded            +--------------------+---------+-----------------+-----------------------------+ Venous anastomosis      0  occluded            +--------------------+---------+-----------------+-----------------------------+ Venous outflow                                    Bandage from  dialysis                                                             catheter            +--------------------+---------+-----------------+-----------------------------+  Summary: Arteriovenous graft-Thrombus noted in the throughout graft.  Occluded AVGG dialysis graft.  *See table(s) above for measurements and observations.  Diagnosing physician: Cordella Shawl MD Electronically signed by Cordella Shawl MD on 06/19/2024 at 2:56:31 PM.   --------------------------------------------------------------------------------   Final     Microbiology: Results for orders placed or performed during the hospital encounter of 06/30/24  Culture, blood (Routine x 2)     Status: Abnormal   Collection Time: 06/30/24  1:37 PM   Specimen: BLOOD  Result Value Ref Range Status   Specimen Description   Final    BLOOD RIGHT ANTECUBITAL Performed at [facility], [address] [place], [place] [address]    Special Requests   Final    BOTTLES DRAWN AEROBIC AND ANAEROBIC Blood Culture adequate volume Performed at [facility], [address], [place], [place] [address]    Culture  Setup Time   Final    GRAM NEGATIVE RODS IN BOTH AEROBIC AND ANAEROBIC BOTTLES CRITICAL RESULT CALLED TO, READ BACK BY AND VERIFIED WITH: MADISON HUNT PHARM.D 06/30/24 1004 KG Performed at [facility], [address] [place], [place] [address]    Culture SERRATIA MARCESCENS (A)  Final   Report Status 07/04/2024 FINAL  Final   Organism ID, Bacteria SERRATIA MARCESCENS  Final      Susceptibility   Serratia marcescens - MIC*    CEFEPIME <=0.12 SENSITIVE Sensitive     ERTAPENEM <=0.12 SENSITIVE Sensitive     CEFTRIAXONE  <=0.25 SENSITIVE Sensitive     CIPROFLOXACIN 0.12 SENSITIVE Sensitive     GENTAMICIN <=1 SENSITIVE Sensitive     MEROPENEM <=0.25 SENSITIVE Sensitive     TRIMETH/SULFA <=20 SENSITIVE Sensitive     * SERRATIA MARCESCENS  Blood Culture ID Panel (Reflexed)     Status: Abnormal    Collection Time: 06/30/24  1:37 PM  Result Value Ref Range Status   Enterococcus faecalis NOT DETECTED NOT DETECTED Final   Enterococcus Faecium NOT DETECTED NOT DETECTED Final   Listeria monocytogenes NOT DETECTED NOT DETECTED Final   Staphylococcus species NOT DETECTED NOT DETECTED Final   Staphylococcus aureus (BCID) NOT DETECTED NOT DETECTED Final   Staphylococcus epidermidis NOT DETECTED NOT DETECTED Final   Staphylococcus lugdunensis NOT DETECTED NOT DETECTED Final   Streptococcus species NOT DETECTED NOT DETECTED Final   Streptococcus agalactiae NOT DETECTED NOT DETECTED Final   Streptococcus pneumoniae NOT DETECTED NOT DETECTED Final   Streptococcus pyogenes NOT DETECTED NOT DETECTED Final   A.calcoaceticus-baumannii NOT DETECTED NOT DETECTED Final   Bacteroides fragilis NOT DETECTED NOT DETECTED Final   Enterobacterales DETECTED (A) NOT DETECTED Final    Comment: Enterobacterales represent a large order of gram negative bacteria, not a single organism. CRITICAL RESULT CALLED TO, READ  BACK BY AND VERIFIED WITH: MADISON HUNT PHARM.D 07/01/24 1004 KG    Enterobacter cloacae complex NOT DETECTED NOT DETECTED Final   Escherichia coli NOT DETECTED NOT DETECTED Final   Klebsiella aerogenes NOT DETECTED NOT DETECTED Final   Klebsiella oxytoca NOT DETECTED NOT DETECTED Final   Klebsiella pneumoniae NOT DETECTED NOT DETECTED Final   Proteus species NOT DETECTED NOT DETECTED Final   Salmonella species NOT DETECTED NOT DETECTED Final   Serratia marcescens DETECTED (A) NOT DETECTED Final    Comment: CRITICAL RESULT CALLED TO, READ BACK BY AND VERIFIED WITH: MADISON HUNT PHARM.D 07/01/24 1004 KG    Haemophilus influenzae NOT DETECTED NOT DETECTED Final   Neisseria meningitidis NOT DETECTED NOT DETECTED Final   Pseudomonas aeruginosa NOT DETECTED NOT DETECTED Final   Stenotrophomonas maltophilia NOT DETECTED NOT DETECTED Final   Candida albicans NOT DETECTED NOT DETECTED Final   Candida  auris NOT DETECTED NOT DETECTED Final   Candida glabrata NOT DETECTED NOT DETECTED Final   Candida krusei NOT DETECTED NOT DETECTED Final   Candida parapsilosis NOT DETECTED NOT DETECTED Final   Candida tropicalis NOT DETECTED NOT DETECTED Final   Cryptococcus neoformans/gattii NOT DETECTED NOT DETECTED Final   CTX-M ESBL NOT DETECTED NOT DETECTED Final   Carbapenem resistance IMP NOT DETECTED NOT DETECTED Final   Carbapenem resistance KPC NOT DETECTED NOT DETECTED Final   Carbapenem resistance NDM NOT DETECTED NOT DETECTED Final   Carbapenem resist OXA 48 LIKE NOT DETECTED NOT DETECTED Final   Carbapenem resistance VIM NOT DETECTED NOT DETECTED Final    Comment: Performed at [facility] [address] [place], [place] [address]  Culture, blood (Routine x 2)     Status: Abnormal   Collection Time: 06/30/24  2:59 PM   Specimen: BLOOD RIGHT FOREARM  Result Value Ref Range Status   Specimen Description   Final    BLOOD RIGHT FOREARM Performed at [facility], [address] [place], [place] [address]    Special Requests   Final    BOTTLES DRAWN AEROBIC AND ANAEROBIC Blood Culture adequate volume Performed at [facility], [address] [place], [place] [address]    Culture  Setup Time   Final    GRAM NEGATIVE RODS IN BOTH AEROBIC AND ANAEROBIC BOTTLES CRITICAL VALUE NOTED.  VALUE IS CONSISTENT WITH PREVIOUSLY REPORTED AND CALLED VALUE. Performed at [facility], [address] [place], [place] [address]    Culture (A)  Final    SERRATIA MARCESCENS SUSCEPTIBILITIES PERFORMED ON PREVIOUS CULTURE WITHIN THE LAST 5 DAYS. Performed at [facility], [address], [place], [place] [address]    Report Status 07/04/2024 FINAL  Final  Resp panel by RT-PCR (RSV, Flu A&B, Covid) Anterior Nasal Swab     Status: None   Collection Time: 06/30/24  3:39 PM   Specimen: Anterior Nasal Swab  Result Value Ref Range Status   SARS Coronavirus 2 by RT  PCR NEGATIVE NEGATIVE Final    Comment: (NOTE) SARS-CoV-2 target nucleic acids are NOT DETECTED.  The SARS-CoV-2 RNA is generally detectable in upper respiratory specimens during the acute phase of infection. The lowest concentration of SARS-CoV-2 viral copies this assay can detect is 138 copies/mL. A negative result does not preclude SARS-Cov-2 infection and should not be used as the sole basis for treatment or other patient management decisions. A negative result may occur with  improper specimen collection/handling, submission of specimen other than nasopharyngeal swab, presence of  viral mutation(s) within the areas targeted by this assay, and inadequate number of viral copies(<138 copies/mL). A negative result must be combined with clinical observations, patient history, and epidemiological information. The expected result is Negative.  Fact Sheet for Patients:  bloggercourse.com  Fact Sheet for Healthcare Providers:  seriousbroker.it  This test is no t yet approved or cleared by the [place]  FDA and  has been authorized for detection and/or diagnosis of SARS-CoV-2 by FDA under an Emergency Use Authorization (EUA). This EUA will remain  in effect (meaning this test can be used) for the duration of the COVID-19 declaration under Section 564(b)(1) of the Act, 21 U.S.C.section 360bbb-3(b)(1), unless the authorization is terminated  or revoked sooner.       Influenza A by PCR NEGATIVE NEGATIVE Final   Influenza B by PCR NEGATIVE NEGATIVE Final    Comment: (NOTE) The Xpert Xpress SARS-CoV-2/FLU/RSV plus assay is intended as an aid in the diagnosis of influenza from Nasopharyngeal swab specimens and should not be used as a sole basis for treatment. Nasal washings and aspirates are unacceptable for Xpert Xpress SARS-CoV-2/FLU/RSV testing.  Fact Sheet for Patients: bloggercourse.com  Fact Sheet for  Healthcare Providers: seriousbroker.it  This test is not yet approved or cleared by the [place]  FDA and has been authorized for detection and/or diagnosis of SARS-CoV-2 by FDA under an Emergency Use Authorization (EUA). This EUA will remain in effect (meaning this test can be used) for the duration of the COVID-19 declaration under Section 564(b)(1) of the Act, 21 U.S.C. section 360bbb-3(b)(1), unless the authorization is terminated or revoked.     Resp Syncytial Virus by PCR NEGATIVE NEGATIVE Final    Comment: (NOTE) Fact Sheet for Patients: bloggercourse.com  Fact Sheet for Healthcare Providers: seriousbroker.it  This test is not yet approved or cleared by the [place]  FDA and has been authorized for detection and/or diagnosis of SARS-CoV-2 by FDA under an Emergency Use Authorization (EUA). This EUA will remain in effect (meaning this test can be used) for the duration of the COVID-19 declaration under Section 564(b)(1) of the Act, 21 U.S.C. section 360bbb-3(b)(1), unless the authorization is terminated or revoked.  Performed at [facility], [address] [place], [place] [address]   Respiratory (~20 pathogens) panel by PCR     Status: None   Collection Time: 07/01/24  7:46 AM   Specimen: Nasopharyngeal Swab; Respiratory  Result Value Ref Range Status   Adenovirus NOT DETECTED NOT DETECTED Final   Coronavirus 229E NOT DETECTED NOT DETECTED Final    Comment: (NOTE) The Coronavirus on the Respiratory Panel, DOES NOT test for the novel  Coronavirus (2019 nCoV)    Coronavirus HKU1 NOT DETECTED NOT DETECTED Final   Coronavirus NL63 NOT DETECTED NOT DETECTED Final   Coronavirus OC43 NOT DETECTED NOT DETECTED Final   Metapneumovirus NOT DETECTED NOT DETECTED Final   Rhinovirus / Enterovirus NOT DETECTED NOT DETECTED Final   Influenza A NOT DETECTED NOT DETECTED Final   Influenza  B NOT DETECTED NOT DETECTED Final   Parainfluenza Virus 1 NOT DETECTED NOT DETECTED Final   Parainfluenza Virus 2 NOT DETECTED NOT DETECTED Final   Parainfluenza Virus 3 NOT DETECTED NOT DETECTED Final   Parainfluenza Virus 4 NOT DETECTED NOT DETECTED Final   Respiratory Syncytial Virus NOT DETECTED NOT DETECTED Final   Bordetella pertussis NOT DETECTED NOT DETECTED Final   Bordetella Parapertussis NOT DETECTED NOT DETECTED Final   Chlamydophila pneumoniae NOT DETECTED NOT DETECTED Final   Mycoplasma  pneumoniae NOT DETECTED NOT DETECTED Final    Comment: Performed at [facility], [address], [place], [place] [address]  MRSA Next Gen by PCR, Nasal     Status: None   Collection Time: 07/01/24  7:46 AM   Specimen: Nasal Mucosa; Nasal Swab  Result Value Ref Range Status   MRSA by PCR Next Gen NOT DETECTED NOT DETECTED Final    Comment: (NOTE) The GeneXpert MRSA Assay (FDA approved for NASAL specimens only), is one component of a comprehensive MRSA colonization surveillance program. It is not intended to diagnose MRSA infection nor to guide or monitor treatment for MRSA infections. Test performance is not FDA approved in patients less than 76 years old. Performed at [facility], [address] [place], [place] [address]   Cath Tip Culture     Status: Abnormal   Collection Time: 07/01/24  5:56 PM   Specimen: Catheter Tip; Other  Result Value Ref Range Status   Specimen Description   Final    CATH TIP Performed at [facility], [address] [place], [place] [address]    Special Requests   Final    NONE Performed at [facility], [address] [place], [place] [address]    Culture 10,000 COLONIES/mL SERRATIA MARCESCENS (A)  Final   Report Status 07/04/2024 FINAL  Final   Organism ID, Bacteria SERRATIA MARCESCENS (A)  Final      Susceptibility   Serratia marcescens - MIC*    CEFEPIME <=0.12 SENSITIVE Sensitive     ERTAPENEM <=0.12  SENSITIVE Sensitive     CEFTRIAXONE  <=0.25 SENSITIVE Sensitive     CIPROFLOXACIN 0.12 SENSITIVE Sensitive     GENTAMICIN <=1 SENSITIVE Sensitive     MEROPENEM <=0.25 SENSITIVE Sensitive     TRIMETH/SULFA <=20 SENSITIVE Sensitive     * 10,000 COLONIES/mL SERRATIA MARCESCENS  Culture, blood (Routine X 2) w Reflex to ID Panel     Status: None (Preliminary result)   Collection Time: 07/03/24  9:24 AM   Specimen: BLOOD  Result Value Ref Range Status   Specimen Description BLOOD BLOOD RIGHT HAND  Final   Special Requests   Final    BOTTLES DRAWN AEROBIC AND ANAEROBIC Blood Culture adequate volume   Culture   Final    NO GROWTH 3 DAYS Performed at [facility], [address] [place], [place] [address]    Report Status PENDING  Incomplete  Culture, blood (Routine X 2) w Reflex to ID Panel     Status: None (Preliminary result)   Collection Time: 07/03/24  9:24 AM   Specimen: BLOOD  Result Value Ref Range Status   Specimen Description BLOOD BLOOD RIGHT ARM  Final   Special Requests   Final    BOTTLES DRAWN AEROBIC AND ANAEROBIC Blood Culture adequate volume   Culture   Final    NO GROWTH 3 DAYS Performed at [facility], [address] [place], [place] [address]    Report Status PENDING  Incomplete   Labs: CBC: Recent Labs  Lab 06/30/24 1337 07/01/24 0419 07/04/24 0329 07/06/24 0557  WBC 12.6* 9.8 9.8 10.3  NEUTROABS 10.3*  --   --   --   HGB 10.2* 8.3* 8.4* 7.7*  HCT 31.7* 24.8* 25.0* 23.0*  MCV 96.4 93.6 93.3 92.7  PLT 150 134* 184 270   Basic Metabolic Panel: Recent Labs  Lab 06/30/24 1337 07/01/24 0419 07/03/24 0814 07/04/24 0329 07/06/24 0557  NA 137 132*  137 139 138  K 4.3 3.8 3.9 4.3 4.8  CL 94* 96* 98 100 100  CO2 21* 20* 22 22 18*  GLUCOSE 92 81 82 82 86  BUN 56* 69* 58* 65* 75*  CREATININE 15.56* 17.48* 16.14* 18.29* 21.99*  CALCIUM  7.8* 6.6* 6.9* 6.7* 6.7*  PHOS  --   --  5.1* 6.2*  --    Liver Function Tests: Recent Labs  Lab  06/30/24 1337 07/03/24 0814 07/04/24 0329  AST 31  --   --   ALT 18  --   --   ALKPHOS 59  --   --   BILITOT 0.6  --   --   PROT 7.5  --   --   ALBUMIN 3.1* 2.6* 2.5*   Discharge time spent: greater than 30 minutes.  Signed: Dr. Sherre [facility] 07/06/2024

## 2024-07-06 NOTE — Assessment & Plan Note (Signed)
 Continue with outpatient IV antibiotic during dialysis session. Follow-up with infectious disease as appropriate.

## 2024-07-06 NOTE — Progress Notes (Signed)
 [facility]  ROUNDING NOTE   Subjective:   Joseph Gilmore is a 53 y.o. male with past medical history of autism, hypertension, OCD, and end stage renal disease on hemodialysis. Patient presents to ED with shortness of breath and has been admitted for Shortness of breath [R06.02] Sepsis (HCC) [A41.9] Fever, unspecified fever cause [R50.9]  Patient is known to our practice and receives outpatient dialysis treatments at [facility] on a TTS schedule, supervised by Dr Douglas.    Update: Patient seen and evaluated during dialysis   HEMODIALYSIS FLOWSHEET:  Blood Flow Rate (mL/min): 299 mL/min Arterial Pressure (mmHg): -171.1 mmHg Venous Pressure (mmHg): 125.25 mmHg TMP (mmHg): 13.94 mmHg Ultrafiltration Rate (mL/min): 1062 mL/min Dialysate Flow Rate (mL/min): 299 ml/min Dialysis Fluid Bolus: Normal Saline Bolus Amount (mL): 100 mL  Resting comfortably   Objective:  Vital signs in last 24 hours:  Temp:  [96.8 F (36 C)-98.7 F (37.1 C)] 98.5 F (36.9 C) (11/08 0844) Pulse Rate:  [53-79] 57 (11/08 0930) Resp:  [13-22] 14 (11/08 0930) BP: (112-143)/(61-108) 140/78 (11/08 0930) SpO2:  [95 %-100 %] 98 % (11/08 0930) Weight:  [884 kg] 115 kg (11/08 0844)  Weight change:  Filed Weights   07/01/24 1320 07/01/24 1708 07/06/24 0844  Weight: 122.9 kg 121.3 kg 115 kg    Intake/Output: No intake/output data recorded.   Intake/Output this shift:  No intake/output data recorded.  Physical Exam: General: NAD  Head: Normocephalic, atraumatic. Moist oral mucosal membranes  Eyes: Anicteric  Lungs:  Diminished, normal effort  Heart: Regular rate and rhythm  Abdomen:  Soft, nontender  Extremities:  Trace peripheral edema.  Neurologic: Awake, alert, conversant  Skin: Warm,dry, no rash  Access: Rt femoral permcath    Basic Metabolic Panel: Recent Labs  Lab 06/30/24 1337 07/01/24 0419 07/03/24 0814 07/04/24 0329 07/06/24 0557  NA 137 132* 137 139 138  K  4.3 3.8 3.9 4.3 4.8  CL 94* 96* 98 100 100  CO2 21* 20* 22 22 18*  GLUCOSE 92 81 82 82 86  BUN 56* 69* 58* 65* 75*  CREATININE 15.56* 17.48* 16.14* 18.29* 21.99*  CALCIUM  7.8* 6.6* 6.9* 6.7* 6.7*  PHOS  --   --  5.1* 6.2*  --     Liver Function Tests: Recent Labs  Lab 06/30/24 1337 07/03/24 0814 07/04/24 0329  AST 31  --   --   ALT 18  --   --   ALKPHOS 59  --   --   BILITOT 0.6  --   --   PROT 7.5  --   --   ALBUMIN 3.1* 2.6* 2.5*   No results for input(s): LIPASE, AMYLASE in the last 168 hours. No results for input(s): AMMONIA in the last 168 hours.  CBC: Recent Labs  Lab 06/30/24 1337 07/01/24 0419 07/04/24 0329 07/06/24 0557  WBC 12.6* 9.8 9.8 10.3  NEUTROABS 10.3*  --   --   --   HGB 10.2* 8.3* 8.4* 7.7*  HCT 31.7* 24.8* 25.0* 23.0*  MCV 96.4 93.6 93.3 92.7  PLT 150 134* 184 270    Cardiac Enzymes: No results for input(s): CKTOTAL, CKMB, CKMBINDEX, TROPONINI in the last 168 hours.  BNP: Invalid input(s): POCBNP  CBG: No results for input(s): GLUCAP in the last 168 hours.   Microbiology: Results for orders placed or performed during the hospital encounter of 06/30/24  Culture, blood (Routine x 2)     Status: Abnormal   Collection Time: 06/30/24  1:37 PM   Specimen: BLOOD  Result Value Ref Range Status   Specimen Description   Final    BLOOD RIGHT ANTECUBITAL Performed at [facility], [address], [place], [place] [address]    Special Requests   Final    BOTTLES DRAWN AEROBIC AND ANAEROBIC Blood Culture adequate volume Performed at [facility], [address] [place], [place] [address]    Culture  Setup Time   Final    GRAM NEGATIVE RODS IN BOTH AEROBIC AND ANAEROBIC BOTTLES CRITICAL RESULT CALLED TO, READ BACK BY AND VERIFIED WITH: MADISON HUNT PHARM.D 06/30/24 1004 KG Performed at [facility], [address] [place], [place] [address]    Culture SERRATIA MARCESCENS (A)  Final   Report  Status 07/04/2024 FINAL  Final   Organism ID, Bacteria SERRATIA MARCESCENS  Final      Susceptibility   Serratia marcescens - MIC*    CEFEPIME <=0.12 SENSITIVE Sensitive     ERTAPENEM <=0.12 SENSITIVE Sensitive     CEFTRIAXONE  <=0.25 SENSITIVE Sensitive     CIPROFLOXACIN 0.12 SENSITIVE Sensitive     GENTAMICIN <=1 SENSITIVE Sensitive     MEROPENEM <=0.25 SENSITIVE Sensitive     TRIMETH/SULFA <=20 SENSITIVE Sensitive     * SERRATIA MARCESCENS  Blood Culture ID Panel (Reflexed)     Status: Abnormal   Collection Time: 06/30/24  1:37 PM  Result Value Ref Range Status   Enterococcus faecalis NOT DETECTED NOT DETECTED Final   Enterococcus Faecium NOT DETECTED NOT DETECTED Final   Listeria monocytogenes NOT DETECTED NOT DETECTED Final   Staphylococcus species NOT DETECTED NOT DETECTED Final   Staphylococcus aureus (BCID) NOT DETECTED NOT DETECTED Final   Staphylococcus epidermidis NOT DETECTED NOT DETECTED Final   Staphylococcus lugdunensis NOT DETECTED NOT DETECTED Final   Streptococcus species NOT DETECTED NOT DETECTED Final   Streptococcus agalactiae NOT DETECTED NOT DETECTED Final   Streptococcus pneumoniae NOT DETECTED NOT DETECTED Final   Streptococcus pyogenes NOT DETECTED NOT DETECTED Final   A.calcoaceticus-baumannii NOT DETECTED NOT DETECTED Final   Bacteroides fragilis NOT DETECTED NOT DETECTED Final   Enterobacterales DETECTED (A) NOT DETECTED Final    Comment: Enterobacterales represent a large order of gram negative bacteria, not a single organism. CRITICAL RESULT CALLED TO, READ BACK BY AND VERIFIED WITH: MADISON HUNT PHARM.D 07/01/24 1004 KG    Enterobacter cloacae complex NOT DETECTED NOT DETECTED Final   Escherichia coli NOT DETECTED NOT DETECTED Final   Klebsiella aerogenes NOT DETECTED NOT DETECTED Final   Klebsiella oxytoca NOT DETECTED NOT DETECTED Final   Klebsiella pneumoniae NOT DETECTED NOT DETECTED Final   Proteus species NOT DETECTED NOT DETECTED Final    Salmonella species NOT DETECTED NOT DETECTED Final   Serratia marcescens DETECTED (A) NOT DETECTED Final    Comment: CRITICAL RESULT CALLED TO, READ BACK BY AND VERIFIED WITH: MADISON HUNT PHARM.D 07/01/24 1004 KG    Haemophilus influenzae NOT DETECTED NOT DETECTED Final   Neisseria meningitidis NOT DETECTED NOT DETECTED Final   Pseudomonas aeruginosa NOT DETECTED NOT DETECTED Final   Stenotrophomonas maltophilia NOT DETECTED NOT DETECTED Final   Candida albicans NOT DETECTED NOT DETECTED Final   Candida auris NOT DETECTED NOT DETECTED Final   Candida glabrata NOT DETECTED NOT DETECTED Final   Candida krusei NOT DETECTED NOT DETECTED Final   Candida parapsilosis NOT DETECTED NOT DETECTED Final   Candida tropicalis NOT DETECTED NOT DETECTED Final   Cryptococcus neoformans/gattii NOT DETECTED NOT DETECTED Final  CTX-M ESBL NOT DETECTED NOT DETECTED Final   Carbapenem resistance IMP NOT DETECTED NOT DETECTED Final   Carbapenem resistance KPC NOT DETECTED NOT DETECTED Final   Carbapenem resistance NDM NOT DETECTED NOT DETECTED Final   Carbapenem resist OXA 48 LIKE NOT DETECTED NOT DETECTED Final   Carbapenem resistance VIM NOT DETECTED NOT DETECTED Final    Comment: Performed at [facility], [address] [place], [place] [address]  Culture, blood (Routine x 2)     Status: Abnormal   Collection Time: 06/30/24  2:59 PM   Specimen: BLOOD RIGHT FOREARM  Result Value Ref Range Status   Specimen Description   Final    BLOOD RIGHT FOREARM Performed at [facility], [address], [place], [place] [address]    Special Requests   Final    BOTTLES DRAWN AEROBIC AND ANAEROBIC Blood Culture adequate volume Performed at [facility], [address] [place], [place] [address]    Culture  Setup Time   Final    GRAM NEGATIVE RODS IN BOTH AEROBIC AND ANAEROBIC BOTTLES CRITICAL VALUE NOTED.  VALUE IS CONSISTENT WITH PREVIOUSLY REPORTED AND CALLED VALUE. Performed at  [facility], [address] [place], [place] [address]    Culture (A)  Final    SERRATIA MARCESCENS SUSCEPTIBILITIES PERFORMED ON PREVIOUS CULTURE WITHIN THE LAST 5 DAYS. Performed at [facility], [address] [place], [place] [address]    Report Status 07/04/2024 FINAL  Final  Resp panel by RT-PCR (RSV, Flu A&B, Covid) Anterior Nasal Swab     Status: None   Collection Time: 06/30/24  3:39 PM   Specimen: Anterior Nasal Swab  Result Value Ref Range Status   SARS Coronavirus 2 by RT PCR NEGATIVE NEGATIVE Final    Comment: (NOTE) SARS-CoV-2 target nucleic acids are NOT DETECTED.  The SARS-CoV-2 RNA is generally detectable in upper respiratory specimens during the acute phase of infection. The lowest concentration of SARS-CoV-2 viral copies this assay can detect is 138 copies/mL. A negative result does not preclude SARS-Cov-2 infection and should not be used as the sole basis for treatment or other patient management decisions. A negative result may occur with  improper specimen collection/handling, submission of specimen other than nasopharyngeal swab, presence of viral mutation(s) within the areas targeted by this assay, and inadequate number of viral copies(<138 copies/mL). A negative result must be combined with clinical observations, patient history, and epidemiological information. The expected result is Negative.  Fact Sheet for Patients:  bloggercourse.com  Fact Sheet for Healthcare Providers:  seriousbroker.it  This test is no t yet approved or cleared by the [place]  FDA and  has been authorized for detection and/or diagnosis of SARS-CoV-2 by FDA under an Emergency Use Authorization (EUA). This EUA will remain  in effect (meaning this test can be used) for the duration of the COVID-19 declaration under Section 564(b)(1) of the Act, 21 U.S.C.section 360bbb-3(b)(1), unless the authorization is  terminated  or revoked sooner.       Influenza A by PCR NEGATIVE NEGATIVE Final   Influenza B by PCR NEGATIVE NEGATIVE Final    Comment: (NOTE) The Xpert Xpress SARS-CoV-2/FLU/RSV plus assay is intended as an aid in the diagnosis of influenza from Nasopharyngeal swab specimens and should not be used as a sole basis for treatment. Nasal washings and aspirates are unacceptable for Xpert Xpress SARS-CoV-2/FLU/RSV testing.  Fact Sheet for Patients: bloggercourse.com  Fact Sheet for Healthcare Providers: seriousbroker.it  This test  is not yet approved or cleared by the [place]  FDA and has been authorized for detection and/or diagnosis of SARS-CoV-2 by FDA under an Emergency Use Authorization (EUA). This EUA will remain in effect (meaning this test can be used) for the duration of the COVID-19 declaration under Section 564(b)(1) of the Act, 21 U.S.C. section 360bbb-3(b)(1), unless the authorization is terminated or revoked.     Resp Syncytial Virus by PCR NEGATIVE NEGATIVE Final    Comment: (NOTE) Fact Sheet for Patients: bloggercourse.com  Fact Sheet for Healthcare Providers: seriousbroker.it  This test is not yet approved or cleared by the [place]  FDA and has been authorized for detection and/or diagnosis of SARS-CoV-2 by FDA under an Emergency Use Authorization (EUA). This EUA will remain in effect (meaning this test can be used) for the duration of the COVID-19 declaration under Section 564(b)(1) of the Act, 21 U.S.C. section 360bbb-3(b)(1), unless the authorization is terminated or revoked.  Performed at [facility], [address] [place], [place] [address]   Respiratory (~20 pathogens) panel by PCR     Status: None   Collection Time: 07/01/24  7:46 AM   Specimen: Nasopharyngeal Swab; Respiratory  Result Value Ref Range Status   Adenovirus NOT  DETECTED NOT DETECTED Final   Coronavirus 229E NOT DETECTED NOT DETECTED Final    Comment: (NOTE) The Coronavirus on the Respiratory Panel, DOES NOT test for the novel  Coronavirus (2019 nCoV)    Coronavirus HKU1 NOT DETECTED NOT DETECTED Final   Coronavirus NL63 NOT DETECTED NOT DETECTED Final   Coronavirus OC43 NOT DETECTED NOT DETECTED Final   Metapneumovirus NOT DETECTED NOT DETECTED Final   Rhinovirus / Enterovirus NOT DETECTED NOT DETECTED Final   Influenza A NOT DETECTED NOT DETECTED Final   Influenza B NOT DETECTED NOT DETECTED Final   Parainfluenza Virus 1 NOT DETECTED NOT DETECTED Final   Parainfluenza Virus 2 NOT DETECTED NOT DETECTED Final   Parainfluenza Virus 3 NOT DETECTED NOT DETECTED Final   Parainfluenza Virus 4 NOT DETECTED NOT DETECTED Final   Respiratory Syncytial Virus NOT DETECTED NOT DETECTED Final   Bordetella pertussis NOT DETECTED NOT DETECTED Final   Bordetella Parapertussis NOT DETECTED NOT DETECTED Final   Chlamydophila pneumoniae NOT DETECTED NOT DETECTED Final   Mycoplasma pneumoniae NOT DETECTED NOT DETECTED Final    Comment: Performed at [facility], [address] [place], [place] [address]  MRSA Next Gen by PCR, Nasal     Status: None   Collection Time: 07/01/24  7:46 AM   Specimen: Nasal Mucosa; Nasal Swab  Result Value Ref Range Status   MRSA by PCR Next Gen NOT DETECTED NOT DETECTED Final    Comment: (NOTE) The GeneXpert MRSA Assay (FDA approved for NASAL specimens only), is one component of a comprehensive MRSA colonization surveillance program. It is not intended to diagnose MRSA infection nor to guide or monitor treatment for MRSA infections. Test performance is not FDA approved in patients less than 6 years old. Performed at [facility] [address] [place], [place] [address]   Cath Tip Culture     Status: Abnormal   Collection Time: 07/01/24  5:56 PM   Specimen: Catheter Tip; Other  Result Value Ref Range  Status   Specimen Description   Final    CATH TIP Performed at [facility], [address] [place], [place] [address]    Special Requests   Final    NONE Performed at [facility], [address] [place], [place] [address]    Culture 10,000 COLONIES/mL SERRATIA MARCESCENS (A)  Final   Report Status 07/04/2024 FINAL  Final   Organism ID, Bacteria SERRATIA MARCESCENS (A)  Final      Susceptibility   Serratia marcescens - MIC*    CEFEPIME <=0.12 SENSITIVE Sensitive     ERTAPENEM <=0.12 SENSITIVE Sensitive     CEFTRIAXONE  <=0.25 SENSITIVE Sensitive     CIPROFLOXACIN 0.12 SENSITIVE Sensitive     GENTAMICIN <=1 SENSITIVE Sensitive     MEROPENEM <=0.25 SENSITIVE Sensitive     TRIMETH/SULFA <=20 SENSITIVE Sensitive     * 10,000 COLONIES/mL SERRATIA MARCESCENS  Culture, blood (Routine X 2) w Reflex to ID Panel     Status: None (Preliminary result)   Collection Time: 07/03/24  9:24 AM   Specimen: BLOOD  Result Value Ref Range Status   Specimen Description BLOOD BLOOD RIGHT HAND  Final   Special Requests   Final    BOTTLES DRAWN AEROBIC AND ANAEROBIC Blood Culture adequate volume   Culture   Final    NO GROWTH 3 DAYS Performed at [facility], [address], [place], [place] [address]    Report Status PENDING  Incomplete  Culture, blood (Routine X 2) w Reflex to ID Panel     Status: None (Preliminary result)   Collection Time: 07/03/24  9:24 AM   Specimen: BLOOD  Result Value Ref Range Status   Specimen Description BLOOD BLOOD RIGHT ARM  Final   Special Requests   Final    BOTTLES DRAWN AEROBIC AND ANAEROBIC Blood Culture adequate volume   Culture   Final    NO GROWTH 3 DAYS Performed at [facility], [address] [place], [place] [address]    Report Status PENDING  Incomplete    Coagulation Studies: No results for input(s): LABPROT, INR in the last 72 hours.   Urinalysis: No results for input(s): COLORURINE, LABSPEC,  PHURINE, GLUCOSEU, HGBUR, BILIRUBINUR, KETONESUR, PROTEINUR, UROBILINOGEN, NITRITE, LEUKOCYTESUR in the last 72 hours.  Invalid input(s): APPERANCEUR    Imaging: PERIPHERAL VASCULAR CATHETERIZATION Result Date: 07/05/2024 See surgical note for result.     Medications:    ceFEPime (MAXIPIME) IV      heparin   5,000 Units Subcutaneous Q8H   midodrine  10 mg Oral TID WC   multivitamin  1 tablet Oral QHS   tamsulosin   0.4 mg Oral Daily   acetaminophen  **OR** acetaminophen , ondansetron  **OR** ondansetron  (ZOFRAN ) IV, polyethylene glycol  Assessment/ Plan:  Mr. Joseph Gilmore is a 53 y.o.  male with past medical history of autism, hypertension, OCD, and end stage renal disease on hemodialysis.   End stage renal disease on hemodialysis.  Last dialysis treatment received on 11/3, prior to permcath removal.  Vascular replaced permcath on 11/7. Functioning well today for treatment. Next treatment scheduled for Tuesday. If stable, patient can discharge today.  Outpatient clinic notified of Cefepime 2g/2g/2g with dialysis until 07/29/24.   Anemia of chronic kidney disease  Lab Results  Component Value Date   HGB 7.7 (L) 07/06/2024   Hemoglobin remained stable.  Will order Retacrit  with dialysis.  3. 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 Most recent hemoglobin A1c is 5.2 on 06/26/24.   Diet controlled   LOS: 6 Bolden Hagerman 11/8/202510:05 AM

## 2024-07-06 NOTE — Progress Notes (Signed)
 Patient's discharge instructions reviewed with both pt and his aunt whom he lives with ad copies given to them. They both verbalized understanding of discharge instructions. Emphaisi placed on not taking a full shower or bath to prevent his permcath from getting wet. Pt urged to just wash up an wash around the catheter so it doesn't get wet.

## 2024-07-06 NOTE — Progress Notes (Addendum)
 Hemodialysis Note:  Received patient in bed to unit. Alert and oriented. Informed consent singed and in chart.  Treatment initiated: 0900 Treatment completed: 1230  Access used: Right Femoral catheter Access issues: Switched lines due to red port is hard to pull. Blood Flow Rate 200-250   Rinsed back 40 minutes early due to system clot.Transported back to room, alert without acute distress. Report given to patient's RN.  Total UF removed: 900 ml Medications given: Retacrit  10000 units IV  Post HD weight: 114.1 Kg  [facility]

## 2024-07-06 NOTE — Plan of Care (Signed)

## 2024-07-08 ENCOUNTER — Telehealth: Payer: Self-pay

## 2024-07-08 ENCOUNTER — Other Ambulatory Visit: Payer: Self-pay | Admitting: Urology

## 2024-07-08 LAB — CULTURE, BLOOD (ROUTINE X 2)
Culture: NO GROWTH
Culture: NO GROWTH
Special Requests: ADEQUATE
Special Requests: ADEQUATE

## 2024-07-08 NOTE — Patient Instructions (Signed)
 Visit Information  Thank you for taking time to visit with me today. Please don't hesitate to contact me if I can be of assistance to you before our next scheduled telephone appointment.  Our next appointment is by telephone on Monday November 17th at 11:00am  Following is a copy of your care plan:   Goals Addressed             This Visit's Progress    VBCI Transitions of Care (TOC) Care Plan       Problems:  Recent Hospitalization for treatment of ESRD and Bacteremia Hospital or ED Adm Risk of 93%  Goal:  Over the next 30 days, the patient will not experience hospital readmission  Interventions:    Chronic Kidney Disease Interventions: Reviewed prescribed diet Low Sodium Heart Healthy Reviewed medications with patient and discussed importance of compliance    Discussed plans with patient for ongoing care management follow up and provided patient with direct contact information for care management team    Assessed social determinant of health barriers    Attend Dialysis three times a week - Tyesday, Thursday, Saturday Last practice recorded BP readings:  BP Readings from Last 3 Encounters:  07/06/24 135/81  06/27/24 134/74  06/19/24 123/75   Most recent eGFR/CrCl:  Lab Results  Component Value Date   EGFR 91 05/06/2024    No components found for: CRCL Call MD for: persistant dizziness or light-headedness Call MD for: persistant nausea and vomiting Call MD for: redness, tenderness, or signs of infection (pain, swelling, redness, odor or green/yellow discharge around incision site) Call MD for: severe uncontrolled pain Call MD for: temperature >100.4   Patient Self Care Activities:  Attend all scheduled provider appointments Call pharmacy for medication refills 3-7 days in advance of running out of medications Call provider office for new concerns or questions  Notify RN Care Manager of [facility] call rescheduling needs Participate in Transition of Care Program/Attend  TOC scheduled calls Take medications as prescribed    Plan:  Telephone follow up appointment with care management team member scheduled for:  Monday November 17th at 11:00am        Patient verbalizes understanding of instructions and care plan provided today and agrees to view in MyChart. Active MyChart status and patient understanding of how to access instructions and care plan via MyChart confirmed with patient.     The patient has been provided with contact information for the care management team and has been advised to call with any health related questions or concerns.   Please call the care guide team at [phone] if you need to cancel or reschedule your appointment.   Please call the Suicide and Crisis Lifeline: 988 call the [place]  National Suicide Prevention Lifeline: 281-419-4957 or TTY: 818-540-1756 TTY 412 623 2391) to talk to a trained counselor if you are experiencing a Mental Health or Behavioral Health Crisis or need someone to talk to.  Medford Balboa, BSN, RN [place]  VBCI - [employer] [facility] RN Care Manager [phone]

## 2024-07-08 NOTE — Transitions of Care (Post Inpatient/ED Visit) (Signed)
 07/08/2024  Name: Joseph Gilmore MRN: [id number] DOB: [birth year]  Today's TOC FU Call Status: Today's TOC FU Call Status:: Successful TOC FU Call Completed TOC FU Call Complete Date: 07/08/24 Patient's Name and Date of Birth confirmed.  Transition Care Management Follow-up Telephone Call Date of Discharge: 07/06/24 Discharge Facility: [facility]) Type of Discharge: Inpatient Admission Primary Inpatient Discharge Diagnosis:: Sepsis How have you been since you were released from the hospital?: Better Any questions or concerns?: No  Items Reviewed: Did you receive and understand the discharge instructions provided?: Yes Medications obtained,verified, and reconciled?: Yes (Medications Reviewed) Any new allergies since your discharge?: No Dietary orders reviewed?: Yes Type of Diet Ordered:: Low sodium Heart Healthy Do you have support at home?: Yes People in Home [RPT]: parent(s), other relative(s) Name of Support/Comfort Primary Source: Violetta Arts  Medications Reviewed Today: Medications Reviewed Today     Reviewed by Moises Reusing, RN (Case Manager) on 07/08/24 at 1159  Med List Status: <None>   Medication Order Taking? Sig Documenting Provider Last Dose Status Informant  calcium  acetate (PHOSLO ) 667 MG capsule [id number]  Take 667 mg by mouth 3 (three) times daily with meals.  Patient not taking: No sig reported   [provider]  Active Family Member  ceFEPime (MAXIPIME) IVPB [id number]  Inject 2 g into the vein every Tuesday, Thursday, and Saturday at 6 PM for 23 days. Give Cefepime 2 g IV during every HD session on Tues/Thurs/Sat Indication:  Serratia bacteremia Last Day of Therapy:  07/29/24 (Last dose on 07/27/24) Labs - Once weekly:  CBC/D and CMP Fax weekly lab results  promptly to [phone] & [phone] Cox, Amy N, DO  Active   Cholecalciferol (VITAMIN D -3) 125 MCG (5000 UT) TABS [id number]  Take 5,000 Units by mouth  daily. [provider]  Active Family Member  dutasteride  (AVODART ) 0.5 MG capsule [id number]  TAKE ONE CAPSULE BY MOUTH DAILY Stoioff, Glendia BROCKS, MD  Active Family Member  HYDROcodone -acetaminophen  (NORCO) 5-325 MG tablet [id number]  Take 1-2 tablets by mouth every 6 (six) hours as needed for moderate pain (pain score 4-6) or severe pain (pain score 7-10).  Patient not taking: Reported on 07/08/2024   Jama Cordella [name], MD  Active Family Member  midodrine (PROAMATINE) 10 MG tablet [id number]  Take 1 tablet (10 mg total) by mouth 3 (three) times daily with meals. Cox, Amy N, DO  Active   multivitamin (RENA-VIT) TABS tablet [id number]  Take by mouth. [provider]  Active Family Member  tamsulosin  (FLOMAX ) 0.4 MG CAPS capsule [id number]  TAKE ONE CAPSULE BY MOUTH DAILY Stoioff, Glendia BROCKS, MD  Active Family Member            Home Care and Equipment/Supplies: Were Home Health Services Ordered?: NA Any new equipment or medical supplies ordered?: NA  Functional Questionnaire: Do you need assistance with bathing/showering or dressing?: No (The patient's Aunt provides assistance) Do you need assistance with meal preparation?: Yes Do you need assistance with eating?: No Do you have difficulty maintaining continence: No Do you need assistance with getting out of bed/getting out of a chair/moving?: No Do you have difficulty managing or taking your medications?: Yes (The patient's Aunt provides assistance)  Follow up appointments reviewed: PCP Follow-up appointment confirmed?: No (The patient has an appointment in December. Encouraged the DPR to move the appointment up) MD Provider Line Number:[phone] Given: No Specialist Hospital Follow-up appointment confirmed?: Yes Date of Specialist follow-up appointment?: 07/23/24 Follow-Up  Specialty Provider:: Infectious Disease Do you need transportation to your follow-up appointment?: No Do you understand care options if your  condition(s) worsen?: Yes-patient verbalized understanding  SDOH Interventions Today    Flowsheet Row Most Recent Value  SDOH Interventions   Food Insecurity Interventions Intervention Not Indicated  Housing Interventions Intervention Not Indicated  Transportation Interventions Intervention Not Indicated  Utilities Interventions Intervention Not Indicated    Goals Addressed             This Visit's Progress    VBCI Transitions of Care (TOC) Care Plan       Problems:  Recent Hospitalization for treatment of ESRD and Bacteremia Hospital or ED Adm Risk of 93%  Goal:  Over the next 30 days, the patient will not experience hospital readmission  Interventions:    Chronic Kidney Disease Interventions: Reviewed prescribed diet Low Sodium Heart Healthy Reviewed medications with patient and discussed importance of compliance    Discussed plans with patient for ongoing care management follow up and provided patient with direct contact information for care management team    Assessed social determinant of health barriers    Attend Dialysis three times a week - Tyesday, Thursday, Saturday Last practice recorded BP readings:  BP Readings from Last 3 Encounters:  07/06/24 135/81  06/27/24 134/74  06/19/24 123/75   Most recent eGFR/CrCl:  Lab Results  Component Value Date   EGFR 91 05/06/2024    No components found for: CRCL Call MD for: persistant dizziness or light-headedness Call MD for: persistant nausea and vomiting Call MD for: redness, tenderness, or signs of infection (pain, swelling, redness, odor or green/yellow discharge around incision site) Call MD for: severe uncontrolled pain Call MD for: temperature >100.4   Patient Self Care Activities:  Attend all scheduled provider appointments Call pharmacy for medication refills 3-7 days in advance of running out of medications Call provider office for new concerns or questions  Notify RN Care Manager of [facility] call  rescheduling needs Participate in Transition of Care Program/Attend [facility] scheduled calls Take medications as prescribed    Plan:  Telephone follow up appointment with care management team member scheduled for:  Monday November 17th at 11:00am        Medford Balboa, BSN, RN [place]  VBCI - [employer] [facility] RN Care Manager [phone]

## 2024-07-09 DIAGNOSIS — Z992 Dependence on renal dialysis: Secondary | ICD-10-CM | POA: Diagnosis not present

## 2024-07-09 DIAGNOSIS — N2581 Secondary hyperparathyroidism of renal origin: Secondary | ICD-10-CM | POA: Diagnosis not present

## 2024-07-10 ENCOUNTER — Other Ambulatory Visit: Payer: Self-pay

## 2024-07-10 ENCOUNTER — Encounter: Payer: Self-pay | Admitting: Urgent Care

## 2024-07-10 ENCOUNTER — Encounter
Admission: RE | Admit: 2024-07-10 | Discharge: 2024-07-10 | Disposition: A | Discharge status: Home / Self Care | Source: Ambulatory Visit | Attending: Vascular Surgery | Admitting: Vascular Surgery

## 2024-07-10 ENCOUNTER — Other Ambulatory Visit (INDEPENDENT_AMBULATORY_CARE_PROVIDER_SITE_OTHER): Payer: Self-pay | Admitting: Nurse Practitioner

## 2024-07-10 VITALS — BP 126/76 | Resp 18 | Ht 68.0 in | Wt 257.5 lb

## 2024-07-10 DIAGNOSIS — N186 End stage renal disease: Secondary | ICD-10-CM

## 2024-07-10 DIAGNOSIS — D631 Anemia in chronic kidney disease: Secondary | ICD-10-CM | POA: Insufficient documentation

## 2024-07-10 DIAGNOSIS — Z01812 Encounter for preprocedural laboratory examination: Secondary | ICD-10-CM | POA: Diagnosis present

## 2024-07-10 DIAGNOSIS — Z01818 Encounter for other preprocedural examination: Secondary | ICD-10-CM | POA: Insufficient documentation

## 2024-07-10 DIAGNOSIS — Z01810 Encounter for preprocedural cardiovascular examination: Secondary | ICD-10-CM | POA: Diagnosis not present

## 2024-07-10 HISTORY — DX: Obesity, unspecified: E66.9

## 2024-07-10 HISTORY — DX: Iron deficiency anemia, unspecified: D50.9

## 2024-07-10 HISTORY — DX: Benign prostatic hyperplasia without lower urinary tract symptoms: N40.0

## 2024-07-10 HISTORY — DX: Dependence on renal dialysis: Z99.2

## 2024-07-10 HISTORY — DX: Other complications of anesthesia, initial encounter: T88.59XA

## 2024-07-10 LAB — TYPE AND SCREEN
ABO/RH(D): O POS
Antibody Screen: NEGATIVE

## 2024-07-10 LAB — CBC WITH DIFFERENTIAL/PLATELET
Abs Immature Granulocytes: 0.14 K/uL — ABNORMAL HIGH (ref 0.00–0.07)
Basophils Absolute: 0.1 K/uL (ref 0.0–0.1)
Basophils Relative: 1 %
Eosinophils Absolute: 0.3 K/uL (ref 0.0–0.5)
Eosinophils Relative: 3 %
HCT: 26.3 % — ABNORMAL LOW (ref 39.0–52.0)
Hemoglobin: 8.4 g/dL — ABNORMAL LOW (ref 13.0–17.0)
Immature Granulocytes: 1 %
Lymphocytes Relative: 17 %
Lymphs Abs: 1.8 K/uL (ref 0.7–4.0)
MCH: 31.3 pg (ref 26.0–34.0)
MCHC: 31.9 g/dL (ref 30.0–36.0)
MCV: 98.1 fL (ref 80.0–100.0)
Monocytes Absolute: 1.1 K/uL — ABNORMAL HIGH (ref 0.1–1.0)
Monocytes Relative: 10 %
Neutro Abs: 7.2 K/uL (ref 1.7–7.7)
Neutrophils Relative %: 68 %
Platelets: 283 K/uL (ref 150–400)
RBC: 2.68 MIL/uL — ABNORMAL LOW (ref 4.22–5.81)
RDW: 18.6 % — ABNORMAL HIGH (ref 11.5–15.5)
WBC: 10.6 K/uL — ABNORMAL HIGH (ref 4.0–10.5)
nRBC: 0 % (ref 0.0–0.2)

## 2024-07-10 NOTE — Patient Instructions (Addendum)
 Your procedure is scheduled on:07-12-24 Friday Report to the Registration Desk on the 1st floor of the Medical Mall.Then proceed to the 2nd floor Surgery Desk To find out your arrival time, please call [phone] between 1PM - 3PM on:07-11-24 Thursday If your arrival time is 6:00 am, do not arrive before that time as the Medical Mall entrance doors do not open until 6:00 am.  REMEMBER: Instructions that are not followed completely may result in serious medical risk, up to and including death; or upon the discretion of your surgeon and anesthesiologist your surgery may need to be rescheduled.  Do not eat food OR drink liquids after midnight the night before surgery.  No gum chewing or hard candies.  One week prior to surgery:Stop NOW (07-10-24) Stop Anti-inflammatories (NSAIDS) such as Advil, Aleve, Ibuprofen, Motrin, Naproxen, Naprosyn and Aspirin based products such as Excedrin, Goody's Powder, BC Powder. Stop ANY OVER THE COUNTER supplements until after surgery (Vitamin D3)  You may however, continue to take Tylenol  if needed for pain up until the day of surgery.  Continue taking all of your other prescription medications up until the day of surgery.  ON THE DAY OF SURGERY ONLY TAKE THESE MEDICATIONS WITH SIPS OF WATER: -midodrine (PROAMATINE)  -dutasteride  (AVODART )  -tamsulosin  (FLOMAX )   No Alcohol for 24 hours before or after surgery.  No Smoking including e-cigarettes for 24 hours before surgery.  No chewable tobacco products for at least 6 hours before surgery.  No nicotine patches on the day of surgery.  Do not use any recreational drugs for at least a week (preferably 2 weeks) before your surgery.  Please be advised that the combination of cocaine and anesthesia may have negative outcomes, up to and including death. If you test positive for cocaine, your surgery will be cancelled.  On the morning of surgery brush your teeth with toothpaste and water, you may rinse  your mouth with mouthwash if you wish. Do not swallow any toothpaste or mouthwash.  Do not wear jewelry, make-up, hairpins, clips or nail polish.  For welded (permanent) jewelry: bracelets, anklets, waist bands, etc.  Please have this removed prior to surgery.  If it is not removed, there is a chance that hospital personnel will need to cut it off on the day of surgery.  Do not wear lotions, powders, or perfumes.   Do not shave body hair from the neck down 48 hours before surgery.  Contact lenses, hearing aids and dentures may not be worn into surgery.  Do not bring valuables to the hospital. [facility] is not responsible for any missing/lost belongings or valuables.   Notify your doctor if there is any change in your medical condition (cold, fever, infection).  Wear comfortable clothing (specific to your surgery type) to the hospital.  After surgery, you can help prevent lung complications by doing breathing exercises.  Take deep breaths and cough every 1-2 hours. Your doctor may order a device called an Incentive Spirometer to help you take deep breaths. When coughing or sneezing, hold a pillow firmly against your incision with both hands. This is called "splinting." Doing this helps protect your incision. It also decreases belly discomfort.  If you are being admitted to the hospital overnight, leave your suitcase in the car. After surgery it may be brought to your room.  In case of increased patient census, it may be necessary for you, the patient, to continue your postoperative care in the Same Day Surgery department.  If you are  being discharged the day of surgery, you will not be allowed to drive home. You will need a responsible individual to drive you home and stay with you for 24 hours after surgery.   If you are taking public transportation, you will need to have a responsible individual with you.  Please call the Pre-admissions Testing Dept. at [phone] if you have  any questions about these instructions.  Surgery Visitation Policy:  Patients having surgery or a procedure may have two visitors.  Children under the age of 60 must have an adult with them who is not the patient.   Merchandiser, Retail to address health-related social needs:  [email or web address]

## 2024-07-11 ENCOUNTER — Emergency Department
Admission: EM | Admit: 2024-07-11 | Discharge: 2024-07-11 | Disposition: A | Discharge status: Home / Self Care | Source: Ambulatory Visit | Attending: Emergency Medicine | Admitting: Emergency Medicine

## 2024-07-11 ENCOUNTER — Other Ambulatory Visit: Payer: Self-pay

## 2024-07-11 ENCOUNTER — Telehealth (INDEPENDENT_AMBULATORY_CARE_PROVIDER_SITE_OTHER): Payer: Self-pay | Admitting: Vascular Surgery

## 2024-07-11 DIAGNOSIS — Z992 Dependence on renal dialysis: Secondary | ICD-10-CM | POA: Diagnosis not present

## 2024-07-11 DIAGNOSIS — I120 Hypertensive chronic kidney disease with stage 5 chronic kidney disease or end stage renal disease: Secondary | ICD-10-CM | POA: Diagnosis not present

## 2024-07-11 DIAGNOSIS — N186 End stage renal disease: Secondary | ICD-10-CM | POA: Diagnosis not present

## 2024-07-11 DIAGNOSIS — Z139 Encounter for screening, unspecified: Secondary | ICD-10-CM | POA: Insufficient documentation

## 2024-07-11 LAB — CBC
HCT: 24.6 % — ABNORMAL LOW (ref 39.0–52.0)
Hemoglobin: 7.9 g/dL — ABNORMAL LOW (ref 13.0–17.0)
MCH: 31.2 pg (ref 26.0–34.0)
MCHC: 32.1 g/dL (ref 30.0–36.0)
MCV: 97.2 fL (ref 80.0–100.0)
Platelets: 225 K/uL (ref 150–400)
RBC: 2.53 MIL/uL — ABNORMAL LOW (ref 4.22–5.81)
RDW: 18.7 % — ABNORMAL HIGH (ref 11.5–15.5)
WBC: 9.3 K/uL (ref 4.0–10.5)
nRBC: 0 % (ref 0.0–0.2)

## 2024-07-11 LAB — BASIC METABOLIC PANEL WITH GFR
Anion gap: 17 — ABNORMAL HIGH (ref 5–15)
BUN: 44 mg/dL — ABNORMAL HIGH (ref 6–20)
CO2: 24 mmol/L (ref 22–32)
Calcium: 7.3 mg/dL — ABNORMAL LOW (ref 8.9–10.3)
Chloride: 100 mmol/L (ref 98–111)
Creatinine, Ser: 19.1 mg/dL — ABNORMAL HIGH (ref 0.61–1.24)
GFR, Estimated: 3 mL/min — ABNORMAL LOW (ref >60)
Glucose, Bld: 89 mg/dL (ref 70–99)
Potassium: 4.5 mmol/L (ref 3.5–5.1)
Sodium: 141 mmol/L (ref 135–145)

## 2024-07-11 NOTE — ED Notes (Signed)
 Lab at bedside obtaining labwork.

## 2024-07-11 NOTE — ED Provider Notes (Signed)
 [facility] Provider Note    Event Date/Time   First MD Initiated Contact with Patient 07/11/24 [phone]     (approximate)  History   Abnormal Labs  HPI  JOJUAN CHAMPNEY II is a 53 y.o. male end-stage renal disease on hemodialysis due today.  Also advises recent bloodstream infection getting antibiotics.  He has been doing very well no fevers he is feeling well in his normal health.  His family got a phone call today to come to the ER because his potassium draw showed elevated potassium about 8.0  Discussed with Dr. Lavinia. Advises check stat K+ (suspect it could be a hemolyzed sample as he had full, normal HD treatments both Sat and Tues).    Principal Problem:   Sepsis (HCC) Active Problems:   ESRD on dialysis (HCC)   Hypertension   Elevated troponin   BPH (benign prostatic hyperplasia)   Obesity, Class III, BMI 40-49.9 (morbid obesity) (HCC)   Infection of dialysis vascular access   Serratia infection   Gram-negative bacteremia   Bacteremia   Physical Exam   Triage Vital Signs: ED Triage Vitals [07/11/24 0734]  Encounter Vitals Group     BP (!) 137/92     Girls Systolic BP Percentile      Girls Diastolic BP Percentile      Boys Systolic BP Percentile      Boys Diastolic BP Percentile      Pulse Rate 76     Resp 18     Temp 98.6 F (37 C)     Temp Source Oral     SpO2 100 %     Weight      Height      Head Circumference      Peak Flow      Pain Score      Pain Loc      Pain Education      Exclude from Growth Chart     Most recent vital signs: Vitals:   07/11/24 0734 07/11/24 0800  BP: (!) 137/92 122/73  Pulse: 76 72  Resp: 18 18  Temp: 98.6 F (37 C)   SpO2: 100% 100%     General: Awake, no distress.  CV:  Good peripheral perfusion.  Normal tone Resp:  Normal effort.  Clear bile Abd:  No distention.  Soft nontender Other:  Warm moist mucous membranes does not appear significantly volume overloaded.  Right femoral  hemodialysis catheter site clean dry no bleeding or evidence of obvious dislodgment.   ED Results / Procedures / Treatments   Labs (all labs ordered are listed, but only abnormal results are displayed) Labs Reviewed  CBC - Abnormal; Notable for the following components:      Result Value   RBC 2.53 (*)    Hemoglobin 7.9 (*)    HCT 24.6 (*)    RDW 18.7 (*)    All other components within normal limits  BASIC METABOLIC PANEL WITH GFR - Abnormal; Notable for the following components:   BUN 44 (*)    Creatinine, Ser 19.10 (*)    Calcium  7.3 (*)    GFR, Estimated 3 (*)    Anion gap 17 (*)    All other components within normal limits       PROCEDURES:  Critical Care performed: No  Procedures   MEDICATIONS ORDERED IN ED: Medications - No data to display   IMPRESSION / MDM / ASSESSMENT AND PLAN / ED COURSE  I  reviewed the triage vital signs and the nursing notes.                              Differential diagnosis includes, but is not limited to, possible hemolysis and lab draw error, true hyperkalemia, other organ concerning acute electrolyte abnormality etc.  He has no chest pain no shortness of breath no fever no systemic symptoms he is well-appearing in his normal state health per him as well as his family.  The concern being potassium drawn on Tuesday.  I spoke with her nephrologist, Kolloru, and he advises that if ECG does not show acute concern for hyperkalemia he would recommend just redrawing and evaluating his metabolic panel here today, he did notes a high potential that the sample was hemolyzed from his clinic Tuesday.  Patient's presentation is most consistent with acute presentation with potential threat to life or bodily function. [Concern for hyperkalemia to a level of 8.0 as an immediate life threat]  The patient is on the cardiac monitor to evaluate for evidence of arrhythmia and/or significant heart rate changes.   Clinical Course as of 07/11/24 1105   Thu Jul 11, 2024  0920 EKG interpreted by me at 745 heart rate 70 QRS 90 QT approximately 440.  There appears to be a slight U wave that I think is likely being over called by ECG computerized read.  I do not see evidence of severe prolongation of the QT Normal sinus rhythm.  No evidence of acute ischemic abnormality.  There is no peaked T waves.  There is no sinusoidal waveforms.  There are no findings that would obviously point towards hyperkalemia.  QT is somewhat prolonged but also [MQ]  9077 Waiting IV team difficult access. [MQ]  0950 Hemoglobin(!): 7.9 Hemoglobin at his recent baselines [MQ]    Clinical Course User Index [MQ] Dicky Anes, MD   Nephrology has communicated with the patient's dialysis center and they are able to have his normal dialysis today.  Nephrology recommends discharge to get his dialysis performed.  His labs are appropriate and not of acute concern, likely hemolyzed sample that was the cause for presentation  ----------------------------------------- 11:04 AM on 07/11/2024 ----------------------------------------- Patient resting comfortably, and his family with him.  They have already called the dialysis center and confirmed that they are ready for him.  He has no concerns at this time and they will be proceeding to the dialysis center at his regular scheduled dialysis today.  Return precautions and treatment recommendations [including going to dialysis immediately after his ER visit ] and follow-up discussed with the patient and family who is agreeable with the plan.   FINAL CLINICAL IMPRESSION(S) / ED DIAGNOSES   Final diagnoses:  ESRD (end stage renal disease) on dialysis [facility])  Encounter for medical screening examination     Rx / DC Orders   ED Discharge Orders     None        Note:  This document was prepared using Dragon voice recognition software and may include unintentional dictation errors.   Dicky Anes, MD 07/11/24 1105

## 2024-07-11 NOTE — ED Triage Notes (Signed)
 Patient to ED via POV for abnormal labs; patient's aunt states they received a call that his K was 8.0. Patient denies all symptoms.

## 2024-07-11 NOTE — ED Notes (Signed)
 Called [facility] to initiate cardiac monitoring.

## 2024-07-11 NOTE — Discharge Instructions (Addendum)
 Please go to your dialysis center now for your regular treatment.

## 2024-07-11 NOTE — ED Provider Notes (Signed)
.     Dicky Anes, MD 07/11/24 1102

## 2024-07-11 NOTE — Telephone Encounter (Signed)
 I contacted [name] by phone to discuss plans for [facility] access surgery.  I have canceled the surgery for placement of an upper extremity access which was scheduled for tomorrow at 7:30 AM for two reasons: Firstly, his positive blood cultures and infected catheter were removed just 7 days ago and regardless of what location we end up placing and access it will most certainly be prosthetic.  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  He has had access placement in both arms and so his anatomy at this point is uncertain and prior to creating a new upper extremity access venograms are indicated.  I will arrange for the patient to have venograms as an outpatient and then plan appropriate upper extremity access once I feel confident we are not at risk for infection.  As a secondary issue apparently his catheter is not working I find this hard to believe but will arrange for a exchange tomorrow.

## 2024-07-12 ENCOUNTER — Ambulatory Visit
Admission: RE | Admit: 2024-07-12 | Discharge: 2024-07-12 | Disposition: A | Discharge status: Home / Self Care | Source: Ambulatory Visit | Attending: Vascular Surgery | Admitting: Vascular Surgery

## 2024-07-12 ENCOUNTER — Encounter: Admission: RE | Payer: Self-pay | Source: Home / Self Care

## 2024-07-12 ENCOUNTER — Encounter: Payer: Self-pay | Admitting: Vascular Surgery

## 2024-07-12 ENCOUNTER — Encounter
Admission: RE | Disposition: A | Discharge status: Home / Self Care | Payer: Self-pay | Source: Ambulatory Visit | Attending: Vascular Surgery

## 2024-07-12 ENCOUNTER — Ambulatory Visit: Admission: RE | Admit: 2024-07-12 | Source: Home / Self Care | Admitting: Vascular Surgery

## 2024-07-12 DIAGNOSIS — Z992 Dependence on renal dialysis: Secondary | ICD-10-CM

## 2024-07-12 DIAGNOSIS — F840 Autistic disorder: Secondary | ICD-10-CM | POA: Diagnosis not present

## 2024-07-12 DIAGNOSIS — Y832 Surgical operation with anastomosis, bypass or graft as the cause of abnormal reaction of the patient, or of later complication, without mention of misadventure at the time of the procedure: Secondary | ICD-10-CM | POA: Insufficient documentation

## 2024-07-12 DIAGNOSIS — T8241XA Breakdown (mechanical) of vascular dialysis catheter, initial encounter: Secondary | ICD-10-CM | POA: Diagnosis not present

## 2024-07-12 DIAGNOSIS — T8249XA Other complication of vascular dialysis catheter, initial encounter: Secondary | ICD-10-CM

## 2024-07-12 DIAGNOSIS — I120 Hypertensive chronic kidney disease with stage 5 chronic kidney disease or end stage renal disease: Secondary | ICD-10-CM | POA: Diagnosis not present

## 2024-07-12 DIAGNOSIS — N186 End stage renal disease: Secondary | ICD-10-CM

## 2024-07-12 DIAGNOSIS — Z452 Encounter for adjustment and management of vascular access device: Secondary | ICD-10-CM

## 2024-07-12 DIAGNOSIS — F429 Obsessive-compulsive disorder, unspecified: Secondary | ICD-10-CM | POA: Insufficient documentation

## 2024-07-12 HISTORY — PX: DIALYSIS/PERMA CATHETER INSERTION: CATH118288

## 2024-07-12 LAB — POTASSIUM (ARMC VASCULAR LAB ONLY): Potassium (ARMC vascular lab): 4.3 mmol/L (ref 3.5–5.1)

## 2024-07-12 SURGERY — INSERTION, GRAFT, ARTERIOVENOUS, UPPER EXTREMITY
Anesthesia: General | Laterality: Right

## 2024-07-12 SURGERY — DIALYSIS/PERMA CATHETER INSERTION
Anesthesia: Moderate Sedation

## 2024-07-12 MED ORDER — CEFAZOLIN SODIUM-DEXTROSE 1-4 GM/50ML-% IV SOLN
1.0000 g | INTRAVENOUS | Status: AC
  Administered 2024-07-12: 1 g via INTRAVENOUS

## 2024-07-12 MED ORDER — METHYLPREDNISOLONE SODIUM SUCC 125 MG IJ SOLR: 125.0000 mg | Freq: Once | INTRAMUSCULAR | Status: DC | PRN

## 2024-07-12 MED ORDER — FAMOTIDINE 20 MG PO TABS: 40.0000 mg | ORAL_TABLET | Freq: Once | ORAL | Status: DC | PRN

## 2024-07-12 MED ORDER — MIDAZOLAM HCL (PF) 2 MG/2ML IJ SOLN
INTRAMUSCULAR | Status: DC | PRN
  Administered 2024-07-12: 1 mg via INTRAVENOUS

## 2024-07-12 MED ORDER — MIDAZOLAM HCL 2 MG/ML PO SYRP: 8.0000 mg | ORAL_SOLUTION | Freq: Once | ORAL | Status: DC | PRN

## 2024-07-12 MED ORDER — HEPARIN (PORCINE) IN NACL 1000-0.9 UT/500ML-% IV SOLN
INTRAVENOUS | Status: DC | PRN
Start: 2024-07-12 — End: 2024-07-12
  Administered 2024-07-12: 500 mL

## 2024-07-12 MED ORDER — FENTANYL CITRATE (PF) 100 MCG/2ML IJ SOLN
INTRAMUSCULAR | Status: DC | PRN
  Administered 2024-07-12: 50 ug via INTRAVENOUS

## 2024-07-12 MED ORDER — LIDOCAINE-EPINEPHRINE (PF) 1 %-1:200000 IJ SOLN
INTRAMUSCULAR | Status: DC | PRN
Start: 2024-07-12 — End: 2024-07-12
  Administered 2024-07-12: 20 mL

## 2024-07-12 MED ORDER — SODIUM CHLORIDE 0.9 % IV SOLN: INTRAVENOUS | Status: DC

## 2024-07-12 MED ORDER — MIDAZOLAM HCL 2 MG/2ML IJ SOLN
INTRAMUSCULAR | Status: AC
  Filled 2024-07-12: qty 2

## 2024-07-12 MED ORDER — CEFAZOLIN SODIUM-DEXTROSE 1-4 GM/50ML-% IV SOLN
INTRAVENOUS | Status: AC
  Filled 2024-07-12: qty 50

## 2024-07-12 MED ORDER — HEPARIN SODIUM (PORCINE) 10000 UNIT/ML IJ SOLN
INTRAMUSCULAR | Status: DC | PRN
Start: 2024-07-12 — End: 2024-07-12
  Administered 2024-07-12: 10000 [IU]

## 2024-07-12 MED ORDER — FENTANYL CITRATE (PF) 50 MCG/ML IJ SOSY
PREFILLED_SYRINGE | INTRAMUSCULAR | Status: AC
  Filled 2024-07-12: qty 1

## 2024-07-12 SURGICAL SUPPLY — 5 items
GUIDEWIRE SUPER STIFF .035X180 (WIRE) IMPLANT
KIT CATH CHRNC PALINDROME 14.5 (CATHETERS) IMPLANT
PACK ANGIOGRAPHY (CUSTOM PROCEDURE TRAY) ×1 IMPLANT
SUT MNCRL AB 4-0 PS2 18 (SUTURE) IMPLANT
SUT PROLENE 0 CT 1 30 (SUTURE) IMPLANT

## 2024-07-12 NOTE — Discharge Instructions (Signed)
Tunneled Catheter Insertion, Care After The following information offers guidance on how to care for yourself after your procedure. Your health care provider may also give you more specific instructions. If you have problems or questions, contact your health care provider. What can I expect after the procedure? After the procedure, it is common to have: Some mild redness, bruising, swelling, and pain around your catheter site. A small amount of blood or clear fluid coming from your incisions. Follow these instructions at home: Medicines Take over-the-counter and prescription medicines only as told by your health care provider. If you were prescribed an antibiotic medicine, take it as told by your health care provider. Do not stop taking the antibiotic even if you start to feel better. Incision care  Follow instructions from your health care provider about how to take care of your incisions. Make sure you: Your dialysis center will perform all needed dressing changes.  Leave stitches (sutures), skin glue, or adhesive strips in place. These skin closures may need to stay in place for 2 weeks or longer. Keep your dressings clean and dry. Check your incision areas every day for signs of infection. Check for: More redness, swelling, or pain. More fluid or blood. Warmth. Pus or a bad smell. Catheter care  Keep your catheter site clean and dry. Do not shower, sponge bath only while catheter in place.  Your dialysis center will Flush your catheter per the dialysis center protocol.  This helps prevent it from becoming clogged. Leave thecaps on the ends of the catheter when not in use. Do not pull on your catheter. Activity Return to your normal activities as told by your health care provider. Ask your health care provider what activities are safe for you. Follow any other activity restrictions as instructed by your health care provider. Do not lift anything that is heavier than 10 lb (4.5 kg), or  the limit that you are told, until your health care provider says that it is safe. Driving Do not drive for 24 hours.  General instructions Follow your health care provider's specific instructions for the type of catheter that you have. Do not take baths, swim, or use a hot tub while your catheter is in place. Keep all follow-up visits. This is important to prevent infection.  Contact a health care provider if: You feel unusually weak or nauseous. You have a fever or chills. You have more redness, swelling, or pain at your incisions or around the area where your catheter has been inserted or where it exits. You have pus or a bad smell coming from your catheter site. Your catheter site feels warm to the touch. Your catheter is not working properly. Fluid is leaking from the catheter, under the dressing, or around the dressing. Your dialysis center is unable to flush your catheter. Get help right away if: Your catheter develops a hole or it breaks. Your catheter comes loose or gets pulled completely out. If this happens, press on your catheter site firmly with a clean cloth until you can get medical help. You develop bleeding from your catheter or your insertion site, and your bleeding does not stop. You have swelling in your shoulder, neck, chest, or face. You have pain or swelling when fluids or medicines are being given through the catheter. You have chest pain or difficulty breathing. These symptoms may represent a serious problem that is an emergency. Do not wait to see if the symptoms will go away. Get medical help right away. Call your   local emergency services (911 in the [place] Do not drive yourself to the hospital. Summary After the procedure, it is common to have mild redness, swelling, and pain around your catheter site. Return to your normal activities as told by your health care provider. Ask your health care provider what activities are safe for you. Follow your health care  provider's specific instructions for the type of catheter that you have. Keep your catheter site and your dressings clean and dry. Contact a health care provider if your catheter is not working properly. Get help right away if you have chest pain, difficulty breathing, or your catheter comes loose or gets pulled completely out. This information is not intended to replace advice given to you by your health care provider. Make sure you discuss any questions you have with your health care provider. Document Revised: 12/21/2020 Document Reviewed: 12/21/2020 Elsevier Patient Education  2023 [employer]     

## 2024-07-12 NOTE — Op Note (Signed)
 OPERATIVE NOTE    PRE-OPERATIVE DIAGNOSIS: 1. ESRD 2. Non-functional permcath  POST-OPERATIVE DIAGNOSIS: same as above  PROCEDURE: Fluoroscopic guidance for placement of catheter Placement of a 44 cm tip to cuff tunneled hemodialysis catheter via the right femoral vein and removal of previous catheter  SURGEON: Selinda Gu, MD  ANESTHESIA:  Local with moderate conscious sedation for 20 minutes using 1 mg of Versed  and 50 mcg of Fentanyl   ESTIMATED BLOOD LOSS: 3 cc  FINDING(S): none  SPECIMEN(S):  None  INDICATIONS:   Patient is a 53 y.o.male who presents with non-functional dialysis catheter and ESRD.  The patient needs long term dialysis access for their ESRD, and a Permcath is necessary.  Risks and benefits are discussed and informed consent is obtained.    DESCRIPTION: After obtaining full informed written consent, the patient was brought back to the vascular suite. The patient received moderate conscious sedation during a face-to-face encounter with me present throughout the entire procedure and supervising the RN monitoring the vital signs, pulse oximetry, telemetry, and mental status throughout the entire procedure. The patient's existing catheter, right thigh and groin were sterilely prepped and draped in a sterile surgical field was created.  The existing catheter was dissected free from the fibrous sheath securing the cuff with hemostats and blunt dissection.  A wire was placed. The existing catheter was then removed and the wire used to keep venous access. I selected a 44 cm tip to cuff tunneled dialysis catheter.  Using fluoroscopic guidance the catheter tips were parked in the retrohepatic vena cava. The appropriate distal connectors were placed. It withdrew blood well and flushed easily with heparinized saline and a concentrated heparin  solution was then placed. It was secured to the chest wall with 2 Prolene sutures. A 4-0 Monocryl pursestring suture was placed around the exit  site. Sterile dressings were placed. The patient tolerated the procedure well and was taken to the recovery room in stable condition.  COMPLICATIONS: None  CONDITION: Stable  Selinda Gu 07/12/2024 12:54 PM   This note was created with Dragon Medical transcription system. Any errors in dictation are purely unintentional.

## 2024-07-12 NOTE — Interval H&P Note (Signed)
 History and Physical Interval Note:  07/12/2024 11:14 AM  Joseph Gilmore  has presented today for surgery, with the diagnosis of Perma Cath Exchange   End Stage Renal.  The various methods of treatment have been discussed with the patient and family. After consideration of risks, benefits and other options for treatment, the patient has consented to  Procedure(s): DIALYSIS/PERMA CATHETER INSERTION (N/A) as a surgical intervention.  The patient's history has been reviewed, patient examined, no change in status, stable for surgery.  I have reviewed the patient's chart and labs.  Questions were answered to the patient's satisfaction.     [name]

## 2024-07-15 ENCOUNTER — Encounter: Payer: Self-pay | Admitting: Vascular Surgery

## 2024-07-15 ENCOUNTER — Other Ambulatory Visit: Payer: Self-pay

## 2024-07-15 NOTE — Patient Instructions (Signed)
 Visit Information  Thank you for taking time to visit with me today. Please don't hesitate to contact me if I can be of assistance to you before our next scheduled telephone appointment.  Our next appointment is by telephone on Wednesday November 26th at 10:00am  Following is a copy of your care plan:   Goals Addressed             This Visit's Progress    VBCI Transitions of Care (TOC) Care Plan       Problems: (reviewed 07/15/24) Recent Hospitalization for treatment of ESRD and Bacteremia Hospital or ED Adm Risk of 93%  Goal:  Over the next 30 days, the patient will not experience hospital readmission  Interventions:    Chronic Kidney Disease Interventions: Reviewed prescribed diet Low Sodium Heart Healthy Reviewed medications with patient and discussed importance of compliance    Discussed plans with patient for ongoing care management follow up and provided patient with direct contact information for care management team    Assessed social determinant of health barriers    Attend Dialysis three times a week - Tuesday, Thursday, Saturday Last practice recorded BP readings:  BP Readings from Last 3 Encounters:  07/06/24 135/81  06/27/24 134/74  06/19/24 123/75   Most recent eGFR/CrCl:  Lab Results  Component Value Date   EGFR 91 05/06/2024    No components found for: CRCL Call MD for: persistant dizziness or light-headedness Call MD for: persistant nausea and vomiting Call MD for: redness, tenderness, or signs of infection (pain, swelling, redness, odor or green/yellow discharge around incision site) Call MD for: severe uncontrolled pain Call MD for: temperature >100.4 11/17 - The patient went to the ER on 11/13 due to a K+ level of 8. He did not get the graft for his arm. Rescheduled.    Patient Self Care Activities:  Attend all scheduled provider appointments Call pharmacy for medication refills 3-7 days in advance of running out of medications Call provider  office for new concerns or questions  Notify RN Care Manager of [facility] call rescheduling needs Participate in Transition of Care Program/Attend TOC scheduled calls Take medications as prescribed    Plan:  Telephone follow up appointment with care management team member scheduled for:  Wednesday November 26th November th at 10:00am        Patient verbalizes understanding of instructions and care plan provided today and agrees to view in MyChart. Active MyChart status and patient understanding of how to access instructions and care plan via MyChart confirmed with patient.     The patient has been provided with contact information for the care management team and has been advised to call with any health related questions or concerns.   Please call the care guide team at [phone] if you need to cancel or reschedule your appointment.   Please call the Suicide and Crisis Lifeline: 988 call the [place]  National Suicide Prevention Lifeline: 9285539960 or TTY: [phone] TTY 202-816-7061) to talk to a trained counselor if you are experiencing a Mental Health or Behavioral Health Crisis or need someone to talk to. Medford Balboa, BSN, RN   VBCI - [employer] [facility] RN Care Manager [phone]

## 2024-07-15 NOTE — Transitions of Care (Post Inpatient/ED Visit) (Signed)
 Transition of Care week 2  Visit Note  07/15/2024  Name: Joseph Gilmore MRN: [id number]          DOB: [birth year]  Situation: Patient enrolled in [facility] 30-day program. Visit completed with [name], DPR by telephone.   Background:   Initial Transition Care Management Follow-up Telephone Call Discharge Date and Diagnosis: 07/06/24, Sepsis   Past Medical History:  Diagnosis Date   Autism    BPH (benign prostatic hyperplasia)    Complication of anesthesia    from 06-26-24 vascular procedure-pt woke up agitated and then became somnolent with no response with Narcan-admitted overnight to ICU   Dialysis patient    T, Th, Sat   ESRD (end stage renal disease) (HCC)    Hypertension    IDA (iron deficiency anemia)    Murmur    Obesity (BMI 30-39.9)    OCD (obsessive compulsive disorder)    Sepsis (HCC) 06/30/2024    Assessment: Patient Reported Symptoms: Cognitive Cognitive Status: Unable to Assess Cognitive/Intellectual Conditions Management [RPT]: Autism Spectrum Disorder      Neurological Neurological Review of Symptoms: No symptoms reported    HEENT HEENT Symptoms Reported: No symptoms reported      Cardiovascular Cardiovascular Symptoms Reported: No symptoms reported    Respiratory Respiratory Symptoms Reported: No symptoms reported    Endocrine Endocrine Symptoms Reported: No symptoms reported Is patient diabetic?: No    Gastrointestinal Gastrointestinal Symptoms Reported: No symptoms reported      Genitourinary Genitourinary Symptoms Reported: Other Other Genitourinary Symptoms: Dialysis Genitourinary Management Strategies: Hemodialysis Hemodialysis Schedule: Tuesday, Thursday, Saturday Hemodialysis Last Treatment: 07/12/24  Integumentary Integumentary Symptoms Reported: Incision Additional Integumentary Details: Dialysis access to right groin Skin Management Strategies: Dressing changes, Routine screening  Musculoskeletal Musculoskelatal Symptoms  Reviewed: No symptoms reported        Psychosocial Psychosocial Symptoms Reported: Not assessed         There were no vitals filed for this visit.    Medications Reviewed Today     Reviewed by Moises Reusing, RN (Case Manager) on 07/15/24 at 1107  Med List Status: <None>   Medication Order Taking? Sig Documenting Provider Last Dose Status Informant  calcium  acetate (PHOSLO ) 667 MG capsule [id number] No Take 667 mg by mouth 3 (three) times daily with meals.  Patient not taking: No sig reported   [provider] Not Taking Active Family Member  ceFEPime (MAXIPIME) IVPB [id number]  Inject 2 g into the vein every Tuesday, Thursday, and Saturday at 6 PM for 23 days. Give Cefepime 2 g IV during every HD session on Tues/Thurs/Sat Indication:  Serratia bacteremia Last Day of Therapy:  07/29/24 (Last dose on 07/27/24) Labs - Once weekly:  CBC/D and CMP Fax weekly lab results  promptly to [phone] & [phone] Cox, Amy N, DO  Active   Cholecalciferol (VITAMIN D -3) 125 MCG (5000 UT) TABS [id number] No Take 5,000 Units by mouth daily. [provider] 07/11/2024 Active Family Member  dutasteride  (AVODART ) 0.5 MG capsule [id number] No TAKE ONE CAPSULE BY MOUTH DAILY  Patient taking differently: Take 0.5 mg by mouth every morning.   Joseph Glendia BROCKS, MD 06/29/2024 Active Family Member  midodrine (PROAMATINE) 10 MG tablet [id number] No Take 1 tablet (10 mg total) by mouth 3 (three) times daily with meals. Cox, Amy N, DO 07/12/2024 Active   multivitamin (RENA-VIT) TABS tablet [id number] No Take 1 tablet by mouth daily. [provider] 07/12/2024 Active Family Member  tamsulosin  (FLOMAX ) 0.4 MG CAPS capsule  [id number] No TAKE ONE CAPSULE BY MOUTH DAILY  Patient taking differently: Take 0.4 mg by mouth daily after breakfast.   Joseph Glendia BROCKS, MD 07/12/2024 Active Family Member            Recommendation:   Continue Current Plan of Care  Follow Up Plan:    Telephone follow-up in 1 week  Medford Balboa, BSN, RN [place]  VBCI - [facility] RN Care Manager [phone]

## 2024-07-18 DIAGNOSIS — N2581 Secondary hyperparathyroidism of renal origin: Secondary | ICD-10-CM | POA: Diagnosis not present

## 2024-07-18 DIAGNOSIS — N186 End stage renal disease: Secondary | ICD-10-CM | POA: Diagnosis not present

## 2024-07-18 DIAGNOSIS — Z992 Dependence on renal dialysis: Secondary | ICD-10-CM | POA: Diagnosis not present

## 2024-07-19 DIAGNOSIS — N2581 Secondary hyperparathyroidism of renal origin: Secondary | ICD-10-CM | POA: Diagnosis not present

## 2024-07-19 DIAGNOSIS — Z992 Dependence on renal dialysis: Secondary | ICD-10-CM | POA: Diagnosis not present

## 2024-07-20 ENCOUNTER — Emergency Department

## 2024-07-20 ENCOUNTER — Other Ambulatory Visit: Payer: Self-pay

## 2024-07-20 ENCOUNTER — Emergency Department: Admission: EM | Admit: 2024-07-20 | Discharge: 2024-07-20 | Disposition: A | Discharge status: Home / Self Care

## 2024-07-20 DIAGNOSIS — D72829 Elevated white blood cell count, unspecified: Secondary | ICD-10-CM | POA: Insufficient documentation

## 2024-07-20 DIAGNOSIS — J90 Pleural effusion, not elsewhere classified: Secondary | ICD-10-CM | POA: Diagnosis not present

## 2024-07-20 DIAGNOSIS — E861 Hypovolemia: Secondary | ICD-10-CM | POA: Insufficient documentation

## 2024-07-20 DIAGNOSIS — R059 Cough, unspecified: Secondary | ICD-10-CM | POA: Diagnosis not present

## 2024-07-20 DIAGNOSIS — R051 Acute cough: Secondary | ICD-10-CM | POA: Insufficient documentation

## 2024-07-20 DIAGNOSIS — Z992 Dependence on renal dialysis: Secondary | ICD-10-CM | POA: Insufficient documentation

## 2024-07-20 DIAGNOSIS — I120 Hypertensive chronic kidney disease with stage 5 chronic kidney disease or end stage renal disease: Secondary | ICD-10-CM | POA: Diagnosis not present

## 2024-07-20 DIAGNOSIS — N186 End stage renal disease: Secondary | ICD-10-CM | POA: Insufficient documentation

## 2024-07-20 DIAGNOSIS — I517 Cardiomegaly: Secondary | ICD-10-CM | POA: Diagnosis not present

## 2024-07-20 LAB — CBC WITH DIFFERENTIAL/PLATELET
Abs Immature Granulocytes: 0.04 K/uL (ref 0.00–0.07)
Basophils Absolute: 0.1 K/uL (ref 0.0–0.1)
Basophils Relative: 1 %
Eosinophils Absolute: 0.4 K/uL (ref 0.0–0.5)
Eosinophils Relative: 4 %
HCT: 21.6 % — ABNORMAL LOW (ref 39.0–52.0)
Hemoglobin: 6.9 g/dL — ABNORMAL LOW (ref 13.0–17.0)
Immature Granulocytes: 0 %
Lymphocytes Relative: 12 %
Lymphs Abs: 1.3 K/uL (ref 0.7–4.0)
MCH: 30.9 pg (ref 26.0–34.0)
MCHC: 31.9 g/dL (ref 30.0–36.0)
MCV: 96.9 fL (ref 80.0–100.0)
Monocytes Absolute: 0.8 K/uL (ref 0.1–1.0)
Monocytes Relative: 8 %
Neutro Abs: 8.1 K/uL — ABNORMAL HIGH (ref 1.7–7.7)
Neutrophils Relative %: 75 %
Platelets: 215 K/uL (ref 150–400)
RBC: 2.23 MIL/uL — ABNORMAL LOW (ref 4.22–5.81)
RDW: 17.4 % — ABNORMAL HIGH (ref 11.5–15.5)
WBC: 10.7 K/uL — ABNORMAL HIGH (ref 4.0–10.5)
nRBC: 0 % (ref 0.0–0.2)

## 2024-07-20 LAB — COMPREHENSIVE METABOLIC PANEL WITH GFR
ALT: 28 U/L (ref 0–44)
AST: 33 U/L (ref 15–41)
Albumin: 3.7 g/dL (ref 3.5–5.0)
Alkaline Phosphatase: 66 U/L (ref 38–126)
Anion gap: 16 — ABNORMAL HIGH (ref 5–15)
BUN: 24 mg/dL — ABNORMAL HIGH (ref 6–20)
CO2: 27 mmol/L (ref 22–32)
Calcium: 8.3 mg/dL — ABNORMAL LOW (ref 8.9–10.3)
Chloride: 96 mmol/L — ABNORMAL LOW (ref 98–111)
Creatinine, Ser: 10.2 mg/dL — ABNORMAL HIGH (ref 0.61–1.24)
GFR, Estimated: 6 mL/min — ABNORMAL LOW (ref >60)
Glucose, Bld: 119 mg/dL — ABNORMAL HIGH (ref 70–99)
Potassium: 3.7 mmol/L (ref 3.5–5.1)
Sodium: 139 mmol/L (ref 135–145)
Total Bilirubin: 0.4 mg/dL (ref 0.0–1.2)
Total Protein: 7.3 g/dL (ref 6.5–8.1)

## 2024-07-20 LAB — RESP PANEL BY RT-PCR (RSV, FLU A&B, COVID)  RVPGX2
Influenza A by PCR: NEGATIVE
Influenza B by PCR: NEGATIVE
Resp Syncytial Virus by PCR: NEGATIVE
SARS Coronavirus 2 by RT PCR: NEGATIVE

## 2024-07-20 LAB — PROCALCITONIN: Procalcitonin: 1.56 ng/mL

## 2024-07-20 LAB — LACTIC ACID, PLASMA: Lactic Acid, Venous: 1.9 mmol/L (ref 0.5–1.9)

## 2024-07-20 MED ORDER — MIDODRINE HCL 5 MG PO TABS
10.0000 mg | ORAL_TABLET | Freq: Once | ORAL | Status: AC
  Administered 2024-07-20: 10 mg via ORAL
  Filled 2024-07-20: qty 2

## 2024-07-20 MED ORDER — LACTATED RINGERS IV BOLUS
250.0000 mL | Freq: Once | INTRAVENOUS | Status: AC
  Administered 2024-07-20: 250 mL via INTRAVENOUS

## 2024-07-20 MED ORDER — LACTATED RINGERS BOLUS PEDS: 250.0000 mL | Freq: Once | INTRAVENOUS | Status: DC

## 2024-07-20 NOTE — ED Triage Notes (Signed)
 First nurse note- pt coming from dialysis- c/o cough and chills after his treatment.

## 2024-07-20 NOTE — Discharge Instructions (Addendum)
 You were seen in the emergency department for 1 day of cough and low blood pressure after dialysis.  Workup today did not demonstrate any signs of sepsis or bacteremia.  Your blood pressure improved after a small amount of IV fluid and giving you your home midodrine  for tonight.  Please continue the medications and antibiotics prescribed from your last hospitalization.  Continue your dialysis schedule.  Take over-the-counter Tylenol  and use nasal saline and room humidification as directed by the bottles for your cough.  Return if any acutely worsening symptoms or any other emergency. -- RETURN PRECAUTIONS & AFTERCARE: (ENGLISH) RETURN PRECAUTIONS: Return immediately to the emergency department or see/call your doctor if you feel worse, weak or have changes in speech or vision, are short of breath, have fever, vomiting, pain, bleeding or dark stool, trouble urinating or any new issues. Return here or see/call your doctor if not improving as expected for your suspected condition. FOLLOW-UP CARE: Call your doctor and/or any doctors we referred you to for more advice and to make an appointment. Do this today, tomorrow or after the weekend. Some doctors only take PPO insurance so if you have HMO insurance you may want to contact your HMO or your regular doctor for referral to a specialist within your plan. Either way tell the doctor's office that it was a referral from the emergency department so you get the soonest possible appointment.  YOUR TEST RESULTS: Take result reports of any blood or urine tests, imaging tests and EKG's to your doctor and any referral doctor. Have any abnormal tests repeated. Your doctor or a referral doctor can let you know when this should be done. Also make sure your doctor contacts this hospital to get any test results that are not currently available such as cultures or special tests for infection and final imaging reports, which are often not available at the time you leave the ER but  which may list additional important findings that are not documented on the preliminary report. BLOOD PRESSURE: If your blood pressure was greater than 120/80 have your blood pressure rechecked within 1 to 2 weeks. MEDICATION SIDE EFFECTS: Do not drive, walk, bike, take the bus, etc. if you have received or are being prescribed any sedating medications such as those for pain or anxiety or certain antihistamines like Benadryl . If you have been give one of these here get a taxi home or have a friend drive you home. Ask your pharmacist to counsel you on potential side effects of any new medication

## 2024-07-20 NOTE — ED Provider Notes (Signed)
 [facility] Provider Note    Event Date/Time   First MD Initiated Contact with Patient 07/20/24 1657     (approximate)   History   Cough   HPI  Joseph Gilmore is a 53 y.o. male with end-stage renal disease on hemodialysis, with recent admission for bacteremia secondary to an infected catheter, on midodrine  who presents after dialysis with 1 day of cough found to be hypotensive on arrival.  Patient denies any chest pain or shortness of breath or abdominal pain.  He reports that he has a cough that has persisted today but denies any fevers or sputum production.  He is compliant with all of his medications however did miss a dose of midodrine  after his dialysis.  He is on antibiotics from his last hospitalization.  He presents with his aunt who contributes to the history.      Physical Exam   Triage Vital Signs: ED Triage Vitals  Encounter Vitals Group     BP 07/20/24 1649 (!) 77/52     Girls Systolic BP Percentile --      Girls Diastolic BP Percentile --      Boys Systolic BP Percentile --      Boys Diastolic BP Percentile --      Pulse Rate 07/20/24 1649 (!) 108     Resp 07/20/24 1649 20     Temp 07/20/24 1649 98.8 F (37.1 C)     Temp Source 07/20/24 1649 Oral     SpO2 07/20/24 1637 98 %     Weight 07/20/24 1644 258 lb (117 kg)     Height 07/20/24 1644 5' 8 (1.727 m)     Head Circumference --      Peak Flow --      Pain Score --      Pain Loc --      Pain Education --      Exclude from Growth Chart --     Most recent vital signs: Vitals:   07/20/24 1900 07/20/24 1930  BP: 99/78 112/70  Pulse: 94 93  Resp: (!) 22 12  Temp:    SpO2: 100% 100%    Nursing Triage Note reviewed. Vital signs reviewed and patients oxygen saturation is normoxic  General: Patient is well nourished, well developed, awake and alert, resting comfortably in no acute distress Head: Normocephalic and atraumatic Eyes: Normal inspection, extraocular muscles  intact, no conjunctival pallor Ear, nose, throat: Normal external exam Neck: Normal range of motion Respiratory: Patient is in no respiratory distress, lungs CTAB Cardiovascular: Patient is not tachycardic, RRR without murmur appreciated GI: Abd SNT with no guarding or rebound  Dialysis catheter in the right groin clean dry and intact Back: Normal inspection of the back with good strength and range of motion throughout all ext Extremities: pulses intact with good cap refills, no LE pitting edema or calf tenderness Neuro: The patient is alert and oriented to person, place, and time, appropriately conversive, with 5/5 bilat UE/LE strength, no gross motor or sensory defects noted. Coordination appears to be adequate.  Ambulates without ataxia Skin: Warm, dry, and intact Psych: normal mood and affect, no SI or HI  ED Results / Procedures / Treatments   Labs (all labs ordered are listed, but only abnormal results are displayed) Labs Reviewed  CBC WITH DIFFERENTIAL/PLATELET - Abnormal; Notable for the following components:      Result Value   WBC 10.7 (*)    RBC 2.23 (*)  Hemoglobin 6.9 (*)    HCT 21.6 (*)    RDW 17.4 (*)    Neutro Abs 8.1 (*)    All other components within normal limits  COMPREHENSIVE METABOLIC PANEL WITH GFR - Abnormal; Notable for the following components:   Chloride 96 (*)    Glucose, Bld 119 (*)    BUN 24 (*)    Creatinine, Ser 10.20 (*)    Calcium  8.3 (*)    GFR, Estimated 6 (*)    Anion gap 16 (*)    All other components within normal limits  RESP PANEL BY RT-PCR (RSV, FLU A&B, COVID)  [id number]  CULTURE, BLOOD (ROUTINE X 2)  CULTURE, BLOOD (ROUTINE X 2)  PROCALCITONIN  LACTIC ACID, PLASMA     EKG EKG and rhythm strip are interpreted by myself:   EKG: Tachycardic sinus rhythm] at heart rate of 107, normal QRS duration, QTc 549 on readout but on my independent review interpretation appears to be closer to 500 nonspecific ST segments and T waves no  ectopy EKG not consistent with Acute STEMI Rhythm strip: Tachycardic in lead Gilmore   RADIOLOGY Chest x-ray: No acute abnormality on my independent review interpretation radiologist reads this as very small bilateral pleural effusions    PROCEDURES:  Critical Care performed: No  Procedures   MEDICATIONS ORDERED IN ED: Medications  lactated ringers  bolus 250 mL (0 mLs Intravenous Stopped 07/20/24 1817)  midodrine  (PROAMATINE ) tablet 10 mg (10 mg Oral Given 07/20/24 1816)     IMPRESSION / MDM / ASSESSMENT AND PLAN / ED COURSE                                Differential diagnosis includes, but is not limited to, sepsis, pneumonia, hypovolemia, missed medication, acute anemia, electrolyte derangement, URI   ED course: Patient presents from triage hypertensive and tachycardia.  He was brought back and blood work did demonstrate a very small leukocytosis at 10.7 but no profound electrolyte derangements.  He was given 250 of IV fluid with improvement of his blood pressure.  Chest x-ray demonstrated no pneumonia.  He had no elevated procalcitonin or lactic acidosis.  His blood pressure further improved with his home dose of midodrine .  Patient COVID test was unremarkable.  Patient was able to ambulate around the unit without any lightheadedness or dizziness and his tachycardia resolved.  He felt comfortable returning home and will use room humidification, Tylenol  and nasal saline for symptoms.  He will return if any acutely worsening symptoms   Clinical Course as of 07/21/24 0052  Sat Jul 20, 2024  1749 WBC(!): 10.7 More elevated than previous [HD]  1800 DG Chest 2 View Only mild fluid edema [HD]  1800 Lactic Acid, Venous: 1.9 Not elevated [HD]  1800 Procalcitonin: 1.56 Not elevated [HD]  1802 Patient reassessed.  Blood pressure improving with small dose of IV fluid.  Will attempt p.o. trial and ambulation and if patient is able to do such will be able to go home [HD]  1825 BP:  93/68 [HD]    Clinical Course User Index [HD] Nicholaus Rolland BRAVO, MD   At time of discharge there is no evidence of acute life, limb, vision, or fertility threat. Patient has stable vital signs, pain is well controlled, patient is ambulatory and p.o. tolerant.  Discharge instructions were completed using the EPIC system. I would refer you to those at this time. All warnings prescriptions follow-up etc.  were discussed in detail with the patient. Patient indicates understanding and is agreeable with this plan. All questions answered.  Patient is made aware that they may return to the emergency department for any worsening or new condition or for any other emergency.  -- Risk: 5 This patient has a high risk of morbidity due to further diagnostic testing or treatment. Rationale: This patient's evaluation and management involve a high risk of morbidity due to the potential severity of presenting symptoms, need for diagnostic testing, and/or initiation of treatment that may require close monitoring. The differential includes conditions with potential for significant deterioration or requiring escalation of care. Treatment decisions in the ED, including medication administration, procedural interventions, or disposition planning, reflect this level of risk. COPA: 5 The patient has the following acute or chronic illness/injury that poses a possible threat to life or bodily function: [X] : The patient has a potentially serious acute condition or an acute exacerbation of a chronic illness requiring urgent evaluation and management in the Emergency Department. The clinical presentation necessitates immediate consideration of life-threatening or function-threatening diagnoses, even if they are ultimately ruled out.  FINAL CLINICAL IMPRESSION(S) / ED DIAGNOSES   Final diagnoses:  Acute cough  Hypovolemia  ESRD on dialysis [facility] [place])     Rx / DC Orders   ED Discharge Orders     None        Note:  This  document was prepared using Dragon voice recognition software and may include unintentional dictation errors.   Nicholaus Rolland BRAVO, MD 07/21/24 [phone]

## 2024-07-20 NOTE — ED Notes (Signed)
 Changed acuity to level 2. Pt had full dialysis session today. BP is 77/52, took twice. HR is 105.

## 2024-07-22 ENCOUNTER — Other Ambulatory Visit: Payer: Self-pay | Admitting: Urology

## 2024-07-23 ENCOUNTER — Encounter: Payer: Self-pay | Admitting: Infectious Diseases

## 2024-07-23 ENCOUNTER — Ambulatory Visit: Attending: Infectious Diseases | Admitting: Infectious Diseases

## 2024-07-23 VITALS — BP 128/81 | HR 103 | Temp 98.4°F | Ht 67.0 in | Wt 257.0 lb

## 2024-07-23 DIAGNOSIS — J069 Acute upper respiratory infection, unspecified: Secondary | ICD-10-CM | POA: Insufficient documentation

## 2024-07-23 DIAGNOSIS — B9689 Other specified bacterial agents as the cause of diseases classified elsewhere: Secondary | ICD-10-CM | POA: Insufficient documentation

## 2024-07-23 DIAGNOSIS — N186 End stage renal disease: Secondary | ICD-10-CM | POA: Insufficient documentation

## 2024-07-23 DIAGNOSIS — I120 Hypertensive chronic kidney disease with stage 5 chronic kidney disease or end stage renal disease: Secondary | ICD-10-CM | POA: Diagnosis not present

## 2024-07-23 DIAGNOSIS — Z79899 Other long term (current) drug therapy: Secondary | ICD-10-CM | POA: Insufficient documentation

## 2024-07-23 DIAGNOSIS — N400 Enlarged prostate without lower urinary tract symptoms: Secondary | ICD-10-CM | POA: Diagnosis not present

## 2024-07-23 DIAGNOSIS — T827XXA Infection and inflammatory reaction due to other cardiac and vascular devices, implants and grafts, initial encounter: Secondary | ICD-10-CM | POA: Diagnosis present

## 2024-07-23 DIAGNOSIS — N2581 Secondary hyperparathyroidism of renal origin: Secondary | ICD-10-CM | POA: Diagnosis not present

## 2024-07-23 DIAGNOSIS — A498 Other bacterial infections of unspecified site: Secondary | ICD-10-CM

## 2024-07-23 DIAGNOSIS — Z992 Dependence on renal dialysis: Secondary | ICD-10-CM | POA: Diagnosis not present

## 2024-07-23 DIAGNOSIS — T80211D Bloodstream infection due to central venous catheter, subsequent encounter: Secondary | ICD-10-CM | POA: Diagnosis not present

## 2024-07-23 DIAGNOSIS — R7881 Bacteremia: Secondary | ICD-10-CM | POA: Diagnosis not present

## 2024-07-23 DIAGNOSIS — T827XXD Infection and inflammatory reaction due to other cardiac and vascular devices, implants and grafts, subsequent encounter: Secondary | ICD-10-CM

## 2024-07-23 DIAGNOSIS — D631 Anemia in chronic kidney disease: Secondary | ICD-10-CM | POA: Insufficient documentation

## 2024-07-23 DIAGNOSIS — F840 Autistic disorder: Secondary | ICD-10-CM | POA: Diagnosis not present

## 2024-07-23 NOTE — Patient Instructions (Signed)
 You are here for follow up of the serratia bacteremia due to dialysis ctaheter infection and you compleing 4 weeks of antibiotic on 07/29/24 at the dialysis center- The past 3 days you have a cold- you wne tot ED for low grade fever and low BP- with fluids and midodrine  the BPO was fine- CXR, Flu, covid were negative. Blood culture was negative- today your temp was 99.9- contiunu symptomatic management with tylenol  and cold medicine that they gave you- if you get worse or temp > 101 go to the ED or let your PCP know I will let your dialysis doctor know that the rt groin Dialysis catheter should be removed  once another dialysis access is established

## 2024-07-23 NOTE — Progress Notes (Signed)
 NAME: Joseph Gilmore  DOB: 1971-01-23  MRN: [id number]  Date/Time: 07/23/2024 11:26 AM  Subjective:   ?pt here for follow up of recent infection with serratia bacteremia due to dialysis catheter infection Pt has been on cefepime  given at the dialysis center - He will complee 4 weeks on 07/29/24  He is doing well His aunt is with him She says htat he had cold like symptoms and she took him to ED on 11/22 and CXR. Labs, blood culture all negative  Joseph Gilmore is a 53 y.o. with a history of autism, end-stage renal disease, hypertension, BPH, nonfunctioning left AV graft had permacath placed on 06/26/2024. Patient's old catheter was removed at that time. Towards the end of the procedure patient became little tachycardic agitated and low BP  To ensure there was no bleeding a venogram was performed through the catheter and that seemed fine.  There was no evidence of bleeding.  For the procedure he had received 1 mg of Versed , 25 mcg of fentanyl  and 50 mg of Benadryl .  He was given Romazicon  and Narcan  which really did not change his mental status.  He was taken to recovery and then was admitted for observation.  He was discharged on 06/27/2024. He was brought into the ED by private vehicle on 11-5/ 25 with shortness of breath fever and chest pain of 1 day duration. Blood culture serratia HD cath was removed Repeat blood culture neg  Past Medical History:  Diagnosis Date   Autism    BPH (benign prostatic hyperplasia)    Complication of anesthesia    from 06-26-24 vascular procedure-pt woke up agitated and then became somnolent with no response with Narcan -admitted overnight to ICU   Dialysis patient    T, Th, Sat   ESRD (end stage renal disease) (HCC)    Hypertension    IDA (iron deficiency anemia)    Murmur    Obesity (BMI 30-39.9)    OCD (obsessive compulsive disorder)    Sepsis (HCC) 06/30/2024    Past Surgical History:  Procedure Laterality Date   A/V FISTULAGRAM Left 10/07/2020    Procedure: A/V FISTULAGRAM;  Surgeon: Jama Cordella [name], MD;  Location: [facility];  Service: Cardiovascular;  Laterality: Left;   A/V FISTULAGRAM Left 02/15/2023   Procedure: A/V Fistulagram;  Surgeon: Jama Cordella [name], MD;  Location: [facility];  Service: Cardiovascular;  Laterality: Left;   A/V FISTULAGRAM Left 11/28/2023   Procedure: A/V Fistulagram;  Surgeon: Jama Cordella [name], MD;  Location: [facility];  Service: Cardiovascular;  Laterality: Left;   A/V FISTULAGRAM Left 12/26/2023   Procedure: A/V Fistulagram;  Surgeon: Jama Cordella [name], MD;  Location: [facility];  Service: Cardiovascular;  Laterality: Left;   AV FISTULA PLACEMENT Left 07/15/2020   Procedure: ARTERIOVENOUS FISTULA CREATION;  Surgeon: Jama Cordella [name], MD;  Location: [facility];  Service: Vascular;  Laterality: Left;   AV FISTULA PLACEMENT Left 10/04/2023   Procedure: INSERTION OF ARTERIOVENOUS (AV) GORE-TEX GRAFT ARM (BRACHIAL AXILLARY);  Surgeon: Jama Cordella [name], MD;  Location: [facility];  Service: Vascular;  Laterality: Left;   DIALYSIS/PERMA CATHETER INSERTION N/A 04/06/2020   Procedure: DIALYSIS/PERMA CATHETER INSERTION;  Surgeon: Marea Selinda RAMAN, MD;  Location: [facility];  Service: Cardiovascular;  Laterality: N/A;   DIALYSIS/PERMA CATHETER INSERTION N/A 05/26/2020   Procedure: DIALYSIS/PERMA CATHETER INSERTION;  Surgeon: Jama Cordella [name], MD;  Location: [facility];  Service: Cardiovascular;  Laterality: N/A;  DIALYSIS/PERMA CATHETER INSERTION N/A 07/09/2020   Procedure: DIALYSIS/PERMA CATHETER INSERTION;  Surgeon: Marea Selinda RAMAN, MD;  Location: [facility];  Service: Cardiovascular;  Laterality: N/A;   DIALYSIS/PERMA CATHETER INSERTION N/A 01/08/2021   Procedure: DIALYSIS/PERMA CATHETER INSERTION;  Surgeon: Marea Selinda RAMAN, MD;  Location: [facility];  Service: Cardiovascular;  Laterality: N/A;   DIALYSIS/PERMA CATHETER INSERTION N/A 03/17/2023    Procedure: DIALYSIS/PERMA CATHETER INSERTION;  Surgeon: Jama Cordella [name], MD;  Location: [facility];  Service: Cardiovascular;  Laterality: N/A;   DIALYSIS/PERMA CATHETER INSERTION N/A 06/26/2024   Procedure: DIALYSIS/PERMA CATHETER INSERTION;  Surgeon: Marea Selinda RAMAN, MD;  Location: [facility];  Service: Cardiovascular;  Laterality: N/A;   DIALYSIS/PERMA CATHETER INSERTION N/A 07/05/2024   Procedure: DIALYSIS/PERMA CATHETER INSERTION;  Surgeon: Jama Cordella [name], MD;  Location: [facility];  Service: Cardiovascular;  Laterality: N/A;   DIALYSIS/PERMA CATHETER INSERTION N/A 07/12/2024   Procedure: DIALYSIS/PERMA CATHETER INSERTION;  Surgeon: Marea Selinda RAMAN, MD;  Location: [facility];  Service: Cardiovascular;  Laterality: N/A;   DIALYSIS/PERMA CATHETER REMOVAL N/A 03/15/2021   Procedure: DIALYSIS/PERMA CATHETER REMOVAL;  Surgeon: Marea Selinda RAMAN, MD;  Location: [facility];  Service: Cardiovascular;  Laterality: N/A;   DIALYSIS/PERMA CATHETER REMOVAL N/A 07/01/2024   Procedure: DIALYSIS/PERMA CATHETER REMOVAL;  Surgeon: Jama Cordella [name], MD;  Location: [facility];  Service: Cardiovascular;  Laterality: N/A;   REVISON OF ARTERIOVENOUS FISTULA Left 12/11/2020   Procedure: ARTERIOVENOUS (AV) FISTULA CREATION ( BRACHIAL CEPHALIC );  Surgeon: Jama Cordella [name], MD;  Location: [facility];  Service: Vascular;  Laterality: Left;   TEMPORARY DIALYSIS CATHETER N/A 03/15/2023   Procedure: TEMPORARY DIALYSIS CATHETER;  Surgeon: Jama Cordella [name], MD;  Location: [facility];  Service: Cardiovascular;  Laterality: N/A;    Social History   Socioeconomic History   Marital status: Single    Spouse name: Not on file   Number of children: Not on file   Years of education: Not on file   Highest education level: Not on file  Occupational History   Not on file  Tobacco Use   Smoking status: Never    Passive exposure: Never   Smokeless tobacco:  Never  Vaping Use   Vaping status: Never Used  Substance and Sexual Activity   Alcohol use: Never   Drug use: Never   Sexual activity: Not Currently  Other Topics Concern   Not on file  Social History Narrative   Lives with Aunt [name]    Social Drivers of Health   Financial Resource Strain: Not on file  Food Insecurity: No Food Insecurity (07/08/2024)   Hunger Vital Sign    Worried About Running Out of Food in the Last Year: Never true    Ran Out of Food in the Last Year: Never true  Transportation Needs: No Transportation Needs (07/08/2024)   PRAPARE - Administrator, Civil Service (Medical): No    Lack of Transportation (Non-Medical): No  Physical Activity: Not on file  Stress: Not on file  Social Connections: Not on file  Intimate Partner Violence: Not At Risk (07/08/2024)   Humiliation, Afraid, Rape, and Kick questionnaire    Fear of Current or Ex-Partner: No    Emotionally Abused: No    Physically Abused: No    Sexually Abused: No    No family history on file. Allergies  Allergen Reactions   Benadryl  [Diphenhydramine ] Other (See Comments)  Suspected hypersensitivity to benadryl  during past visit. Use with caution. Pt became obtunded and hypotensive.    Chlorhexidine  Other (See Comments)    Blisters (topical)   I? Current Outpatient Medications  Medication Sig Dispense Refill   calcium  acetate (PHOSLO ) 667 MG capsule Take 667 mg by mouth 3 (three) times daily with meals.     ceFEPime  (MAXIPIME ) IVPB Inject 2 g into the vein every Tuesday, Thursday, and Saturday at 6 PM for 23 days. Give Cefepime  2 g IV during every HD session on Tues/Thurs/Sat Indication:  Serratia bacteremia Last Day of Therapy:  07/29/24 (Last dose on 07/27/24) Labs - Once weekly:  CBC/D and CMP Fax weekly lab results  promptly to [phone] & [phone] Units 0   Cholecalciferol  (VITAMIN D -3) 125 MCG (5000 UT) TABS Take 5,000 Units by mouth daily.     midodrine   (PROAMATINE ) 10 MG tablet Take 1 tablet (10 mg total) by mouth 3 (three) times daily with meals. 90 tablet 0   multivitamin (RENA-VIT) TABS tablet Take 1 tablet by mouth daily.     tamsulosin  (FLOMAX ) 0.4 MG CAPS capsule TAKE ONE CAPSULE BY MOUTH DAILY 90 capsule 3   No current facility-administered medications for this visit.     Abtx:  Anti-infectives (From admission, onward)    None       REVIEW OF SYSTEMS:  Const: negative fever, negative chills, negative weight loss Eyes: negative diplopia or visual changes, negative eye pain ENT: nasal stuffiness Resp: negative cough, hemoptysis, dyspnea Cards: negative for chest pain, palpitations, lower extremity edema GU: negative for frequency, dysuria and hematuria GI: Negative for abdominal pain, diarrhea, bleeding, constipation Skin: negative for rash and pruritus Heme: negative for easy bruising and gum/nose bleeding MS: negative for myalgias, arthralgias, back pain and muscle weakness Neurolo:negative for headaches, dizziness, vertigo, memory problems  Psych: negative for feelings of anxiety, depression  Endocrine: negative for thyroid, diabetes Allergy/Immunology-asabove Objective:  VITALS:  BP 128/81   Pulse (!) 103   Temp 98.4 F (36.9 C) (Temporal)   Ht 5' 7 (1.702 m)   Wt 257 lb (116.6 kg)   SpO2 96%   BMI 40.25 kg/m   PHYSICAL EXAM:  General: Alert, cooperative, no distress, appears stated age.  Lungs: Clear to auscultation bilaterally. No Wheezing or Rhonchi. No rales. Heart: Regular rate and rhythm, no murmur, rub or gallop. Abdomen: not examined Rt femoral catheter Extremities: atraumatic, no cyanosis. No edema. No clubbing Skin: No rashes or lesions. Or bruising Lymph: Cervical, supraclavicular normal. Neurologic: Grossly non-focal Pertinent Labs Lab Results CBC    Component Value Date/Time   WBC 10.7 (H) 07/20/2024 1719   RBC 2.23 (L) 07/20/2024 1719   HGB 6.9 (L) 07/20/2024 1719   HGB 14.9  05/06/2024 0936   HCT 21.6 (L) 07/20/2024 1719   HCT 45.4 05/06/2024 0936   PLT 215 07/20/2024 1719   PLT 316 05/06/2024 0936   MCV 96.9 07/20/2024 1719   MCV 83 05/06/2024 0936   MCH 30.9 07/20/2024 1719   MCHC 31.9 07/20/2024 1719   RDW 17.4 (H) 07/20/2024 1719   RDW 13.4 05/06/2024 0936   LYMPHSABS 1.3 07/20/2024 1719   LYMPHSABS 1.9 05/06/2024 0936   MONOABS 0.8 07/20/2024 1719   EOSABS 0.4 07/20/2024 1719   EOSABS 0.1 05/06/2024 0936   BASOSABS 0.1 07/20/2024 1719   BASOSABS 0.1 05/06/2024 0936       Latest Ref Rng & Units 07/20/2024    5:19 PM 07/11/2024    9:36  AM 07/06/2024    5:57 AM  CMP  Glucose 70 - 99 mg/dL 880  89  86   BUN 6 - 20 mg/dL 24  44  75   Creatinine 0.61 - 1.24 mg/dL 89.79  80.89  78.00   Sodium 135 - 145 mmol/L 139  141  138   Potassium 3.5 - 5.1 mmol/L 3.7  4.5  4.8   Chloride 98 - 111 mmol/L 96  100  100   CO2 22 - 32 mmol/L 27  24  18    Calcium  8.9 - 10.3 mg/dL 8.3  7.3  6.7   Total Protein 6.5 - 8.1 g/dL 7.3     Total Bilirubin 0.0 - 1.2 mg/dL 0.4     Alkaline Phos 38 - 126 U/L 66     AST 15 - 41 U/L 33     ALT 0 - 44 U/L 28         Microbiology: Recent Results (from the past 240 hours)  Resp panel by RT-PCR (RSV, Flu A&B, Covid) Anterior Nasal Swab     Status: None   Collection Time: 07/20/24  4:54 PM   Specimen: Anterior Nasal Swab  Result Value Ref Range Status   SARS Coronavirus 2 by RT PCR NEGATIVE NEGATIVE Final    Comment: (NOTE) SARS-CoV-2 target nucleic acids are NOT DETECTED.  The SARS-CoV-2 RNA is generally detectable in upper respiratory specimens during the acute phase of infection. The lowest concentration of SARS-CoV-2 viral copies this assay can detect is 138 copies/mL. A negative result does not preclude SARS-Cov-2 infection and should not be used as the sole basis for treatment or other patient management decisions. A negative result may occur with  improper specimen collection/handling, submission of specimen  other than nasopharyngeal swab, presence of viral mutation(s) within the areas targeted by this assay, and inadequate number of viral copies(<138 copies/mL). A negative result must be combined with clinical observations, patient history, and epidemiological information. The expected result is Negative.  Fact Sheet for Patients:  bloggercourse.com  Fact Sheet for Healthcare Providers:  seriousbroker.it  This test is no t yet approved or cleared by the [place]  FDA and  has been authorized for detection and/or diagnosis of SARS-CoV-2 by FDA under an Emergency Use Authorization (EUA). This EUA will remain  in effect (meaning this test can be used) for the duration of the COVID-19 declaration under Section 564(b)(1) of the Act, 21 U.S.C.section 360bbb-3(b)(1), unless the authorization is terminated  or revoked sooner.       Influenza A by PCR NEGATIVE NEGATIVE Final   Influenza B by PCR NEGATIVE NEGATIVE Final    Comment: (NOTE) The Xpert Xpress SARS-CoV-2/FLU/RSV plus assay is intended as an aid in the diagnosis of influenza from Nasopharyngeal swab specimens and should not be used as a sole basis for treatment. Nasal washings and aspirates are unacceptable for Xpert Xpress SARS-CoV-2/FLU/RSV testing.  Fact Sheet for Patients: bloggercourse.com  Fact Sheet for Healthcare Providers: seriousbroker.it  This test is not yet approved or cleared by the [place]  FDA and has been authorized for detection and/or diagnosis of SARS-CoV-2 by FDA under an Emergency Use Authorization (EUA). This EUA will remain in effect (meaning this test can be used) for the duration of the COVID-19 declaration under Section 564(b)(1) of the Act, 21 U.S.C. section 360bbb-3(b)(1), unless the authorization is terminated or revoked.     Resp Syncytial Virus by PCR NEGATIVE NEGATIVE Final     Comment: (NOTE) Fact Sheet for  Patients: bloggercourse.com  Fact Sheet for Healthcare Providers: seriousbroker.it  This test is not yet approved or cleared by the [place]  FDA and has been authorized for detection and/or diagnosis of SARS-CoV-2 by FDA under an Emergency Use Authorization (EUA). This EUA will remain in effect (meaning this test can be used) for the duration of the COVID-19 declaration under Section 564(b)(1) of the Act, 21 U.S.C. section 360bbb-3(b)(1), unless the authorization is terminated or revoked.  Performed at [facility], [address] [place], [place] [address]   Blood culture (routine x 2)     Status: None (Preliminary result)   Collection Time: 07/20/24  5:19 PM   Specimen: BLOOD  Result Value Ref Range Status   Specimen Description BLOOD RIGHT ANTECUBITAL  Final   Special Requests   Final    BOTTLES DRAWN AEROBIC AND ANAEROBIC Blood Culture adequate volume   Culture   Final    NO GROWTH 3 DAYS Performed at [facility], [address] [place], [place] [address]    Report Status PENDING  Incomplete    ? Impression/Recommendation ? ?Sepsis with Serratia bacteremia secondary to hemodialysis catheter related infection.  The catheter was placed on 06/26/2024 but prior to it he had another dialysis catheter and this new catheter was exchanged over a guidewire The catheter has been removed and the tip also has Serratia  pansensitive Serratia 11/5 repeat blood culture NG Patient is currently on cefepime - will complete 4 weeks on  07/29/24  2D echo- technically limited study-  no obvious vegetation    Hypertension.  Patient has been having low BP after dialysis and is on midodrine .   End-stage renal disease on dialysis  rt femorl HD cath- will discuss with his nephrologist to change site  Anemia secondary to CKD Autism   BPH on tamsulosin   ? URI    ________________________________________________ Discussed with patient,and his aunt He is discharged from my clinic

## 2024-07-24 ENCOUNTER — Other Ambulatory Visit: Payer: Self-pay

## 2024-07-24 NOTE — Transitions of Care (Post Inpatient/ED Visit) (Signed)
 Transition of Care week 3  Visit Note  07/24/2024  Name: Joseph Gilmore MRN: [id number]          DOB: 05/30/1971  Situation: Patient enrolled in [facility] 30-day program. Visit completed with DPR [name] by telephone.   Background:   Initial Transition Care Management Follow-up Telephone Call Discharge Date and Diagnosis: 07/06/24, Sepsis   Past Medical History:  Diagnosis Date   Autism    BPH (benign prostatic hyperplasia)    Complication of anesthesia    from 06-26-24 vascular procedure-pt woke up agitated and then became somnolent with no response with Narcan -admitted overnight to ICU   Dialysis patient    T, Th, Sat   ESRD (end stage renal disease) (HCC)    Hypertension    IDA (iron deficiency anemia)    Murmur    Obesity (BMI 30-39.9)    OCD (obsessive compulsive disorder)    Sepsis (HCC) 06/30/2024    Assessment: Patient Reported Symptoms: Cognitive Cognitive Status: Unable to Assess Cognitive/Intellectual Conditions Management [RPT]: Autism Spectrum Disorder      Neurological Neurological Review of Symptoms: No symptoms reported    HEENT HEENT Symptoms Reported: No symptoms reported      Cardiovascular Cardiovascular Symptoms Reported: No symptoms reported Cardiovascular Management Strategies: Medication therapy Cardiovascular Comment: The patient's blood pressure runs low.  Respiratory Respiratory Symptoms Reported: Dry cough Additional Respiratory Details: The patient went to the ED for a check up on 07/20/24 due to cough    Endocrine Endocrine Symptoms Reported: No symptoms reported    Gastrointestinal Gastrointestinal Symptoms Reported: No symptoms reported      Genitourinary Genitourinary Symptoms Reported: Other Additional Genitourinary Details: Dialysis Genitourinary Management Strategies: Hemodialysis Hemodialysis Schedule: Tuesday, Thursday, Saturday Hemodialysis Last Treatment: 07/23/24  Integumentary Integumentary Symptoms Reported:  Wound Additional Integumentary Details: Dialysis access to right groin Skin Management Strategies: Routine screening, Dressing changes  Musculoskeletal Musculoskelatal Symptoms Reviewed: No symptoms reported        Psychosocial Psychosocial Symptoms Reported: No symptoms reported         There were no vitals filed for this visit.    Medications Reviewed Today     Reviewed by Moises Reusing, RN (Case Manager) on 07/24/24 at 0957  Med List Status: <None>   Medication Order Taking? Sig Documenting Provider Last Dose Status Informant  calcium  acetate (PHOSLO ) 667 MG capsule [id number]  Take 667 mg by mouth 3 (three) times daily with meals. [provider]  Active Family Member  ceFEPime  (MAXIPIME ) IVPB [id number]  Inject 2 g into the vein every Tuesday, Thursday, and Saturday at 6 PM for 23 days. Give Cefepime  2 g IV during every HD session on Tues/Thurs/Sat Indication:  Serratia bacteremia Last Day of Therapy:  07/29/24 (Last dose on 07/27/24) Labs - Once weekly:  CBC/D and CMP Fax weekly lab results  promptly to [phone] & [phone] Cox, Amy N, DO  Active   Cholecalciferol  (VITAMIN D -3) 125 MCG (5000 UT) TABS [id number]  Take 5,000 Units by mouth daily. [provider]  Active Family Member  midodrine  (PROAMATINE ) 10 MG tablet [id number]  Take 1 tablet (10 mg total) by mouth 3 (three) times daily with meals. Cox, Amy N, DO  Active   multivitamin (RENA-VIT) TABS tablet [id number]  Take 1 tablet by mouth daily. [provider]  Active Family Member  tamsulosin  (FLOMAX ) 0.4 MG CAPS capsule [id number]  TAKE ONE CAPSULE BY MOUTH DAILY Stoioff, Glendia BROCKS, MD  Active Family Member  Recommendation:   Continue Current Plan of Care  Follow Up Plan:   Telephone follow-up in 1 week  Medford Balboa, BSN, RN [place]  VBCI - [facility] RN Care Manager [phone]

## 2024-07-24 NOTE — Patient Instructions (Signed)
 Visit Information  Thank you for taking time to visit with me today. Please don't hesitate to contact me if I can be of assistance to you before our next scheduled telephone appointment.  Our next appointment is by telephone on Thursday December 4th at 10:00am  Following is a copy of your care plan:   Goals Addressed             This Visit's Progress    VBCI Transitions of Care (TOC) Care Plan       Problems: (reviewed 07/24/24) Recent Hospitalization for treatment of ESRD and Bacteremia Hospital or ED Adm Risk of 93%  Goal:  (reviewed 07/24/24) Over the next 30 days, the patient will not experience hospital readmission  Interventions:  (reviewed 07/24/24)   Chronic Kidney Disease Interventions: Reviewed prescribed diet Low Sodium Heart Healthy Reviewed medications with patient and discussed importance of compliance    Discussed plans with patient for ongoing care management follow up and provided patient with direct contact information for care management team    Assessed social determinant of health barriers    Attend Dialysis three times a week - Tuesday, Thursday, Saturday Last practice recorded BP readings:  BP Readings from Last 3 Encounters:  07/06/24 135/81  06/27/24 134/74  06/19/24 123/75   Most recent eGFR/CrCl:  Lab Results  Component Value Date   EGFR 91 05/06/2024    No components found for: CRCL Call MD for: persistant dizziness or light-headedness Call MD for: persistant nausea and vomiting Call MD for: redness, tenderness, or signs of infection (pain, swelling, redness, odor or green/yellow discharge around incision site) Call MD for: severe uncontrolled pain Call MD for: temperature >100.4 11/17 - The patient went to the ER on 11/13 due to a K+ level of 8. He did not get the graft for his arm. Rescheduled.  11/26 - The patient went to the ER on 11/22 for dry cough and low BP. No changes   Patient Self Care Activities:  (reviewed 07/24/24) Attend  all scheduled provider appointments Call pharmacy for medication refills 3-7 days in advance of running out of medications Call provider office for new concerns or questions  Notify RN Care Manager of [facility] call rescheduling needs Participate in Transition of Care Program/Attend [facility] scheduled calls Take medications as prescribed    Plan:  Telephone follow up appointment with care management team member scheduled for:  Thursday December 4th at 10:00am        Patient verbalizes understanding of instructions and care plan provided today and agrees to view in MyChart. Active MyChart status and patient understanding of how to access instructions and care plan via MyChart confirmed with patient.     The patient has been provided with contact information for the care management team and has been advised to call with any health related questions or concerns.   Please call the care guide team at [phone] if you need to cancel or reschedule your appointment.   Please call the Suicide and Crisis Lifeline: 988 call the [place]  National Suicide Prevention Lifeline: 564-068-3600 or TTY: 331-123-7584 TTY 765-481-3961) to talk to a trained counselor if you are experiencing a Mental Health or Behavioral Health Crisis or need someone to talk to.  Medford Balboa, BSN, RN [place]  VBCI - [employer] [facility] RN Care Manager [phone]

## 2024-07-25 LAB — CULTURE, BLOOD (ROUTINE X 2)
Culture: NO GROWTH
Special Requests: ADEQUATE

## 2024-07-27 DIAGNOSIS — N186 End stage renal disease: Secondary | ICD-10-CM | POA: Diagnosis not present

## 2024-07-27 DIAGNOSIS — N2581 Secondary hyperparathyroidism of renal origin: Secondary | ICD-10-CM | POA: Diagnosis not present

## 2024-07-27 DIAGNOSIS — Z992 Dependence on renal dialysis: Secondary | ICD-10-CM | POA: Diagnosis not present

## 2024-07-28 DIAGNOSIS — Z992 Dependence on renal dialysis: Secondary | ICD-10-CM | POA: Diagnosis not present

## 2024-07-28 DIAGNOSIS — N186 End stage renal disease: Secondary | ICD-10-CM | POA: Diagnosis not present

## 2024-07-31 ENCOUNTER — Encounter: Payer: Self-pay | Admitting: Emergency Medicine

## 2024-07-31 ENCOUNTER — Emergency Department
Admission: EM | Admit: 2024-07-31 | Discharge: 2024-08-01 | Disposition: A | Attending: Emergency Medicine | Admitting: Emergency Medicine

## 2024-07-31 ENCOUNTER — Other Ambulatory Visit: Payer: Self-pay

## 2024-07-31 DIAGNOSIS — D649 Anemia, unspecified: Secondary | ICD-10-CM

## 2024-07-31 DIAGNOSIS — N186 End stage renal disease: Secondary | ICD-10-CM | POA: Insufficient documentation

## 2024-07-31 DIAGNOSIS — Z992 Dependence on renal dialysis: Secondary | ICD-10-CM | POA: Diagnosis not present

## 2024-07-31 DIAGNOSIS — D631 Anemia in chronic kidney disease: Secondary | ICD-10-CM | POA: Insufficient documentation

## 2024-07-31 DIAGNOSIS — F840 Autistic disorder: Secondary | ICD-10-CM | POA: Insufficient documentation

## 2024-07-31 DIAGNOSIS — I120 Hypertensive chronic kidney disease with stage 5 chronic kidney disease or end stage renal disease: Secondary | ICD-10-CM | POA: Diagnosis not present

## 2024-07-31 DIAGNOSIS — R799 Abnormal finding of blood chemistry, unspecified: Secondary | ICD-10-CM | POA: Diagnosis present

## 2024-07-31 DIAGNOSIS — D638 Anemia in other chronic diseases classified elsewhere: Secondary | ICD-10-CM

## 2024-07-31 LAB — CBC
HCT: 23.8 % — ABNORMAL LOW (ref 39.0–52.0)
Hemoglobin: 7.1 g/dL — ABNORMAL LOW (ref 13.0–17.0)
MCH: 28.5 pg (ref 26.0–34.0)
MCHC: 29.8 g/dL — ABNORMAL LOW (ref 30.0–36.0)
MCV: 95.6 fL (ref 80.0–100.0)
Platelets: 333 K/uL (ref 150–400)
RBC: 2.49 MIL/uL — ABNORMAL LOW (ref 4.22–5.81)
RDW: 18.3 % — ABNORMAL HIGH (ref 11.5–15.5)
WBC: 10.6 K/uL — ABNORMAL HIGH (ref 4.0–10.5)
nRBC: 0.2 % (ref 0.0–0.2)

## 2024-07-31 LAB — PREPARE RBC (CROSSMATCH)

## 2024-07-31 LAB — COMPREHENSIVE METABOLIC PANEL WITH GFR
ALT: 33 U/L (ref 0–44)
AST: 30 U/L (ref 15–41)
Albumin: 3.7 g/dL (ref 3.5–5.0)
Alkaline Phosphatase: 75 U/L (ref 38–126)
Anion gap: 17 — ABNORMAL HIGH (ref 5–15)
BUN: 32 mg/dL — ABNORMAL HIGH (ref 6–20)
CO2: 25 mmol/L (ref 22–32)
Calcium: 7.8 mg/dL — ABNORMAL LOW (ref 8.9–10.3)
Chloride: 102 mmol/L (ref 98–111)
Creatinine, Ser: 14.2 mg/dL — ABNORMAL HIGH (ref 0.61–1.24)
GFR, Estimated: 4 mL/min — ABNORMAL LOW (ref >60)
Glucose, Bld: 78 mg/dL (ref 70–99)
Potassium: 4.3 mmol/L (ref 3.5–5.1)
Sodium: 144 mmol/L (ref 135–145)
Total Bilirubin: 0.3 mg/dL (ref 0.0–1.2)
Total Protein: 7.6 g/dL (ref 6.5–8.1)

## 2024-07-31 MED ORDER — SODIUM CHLORIDE 0.9 % IV SOLN
10.0000 mL/h | Freq: Once | INTRAVENOUS | Status: AC
  Administered 2024-07-31: 10 mL/h via INTRAVENOUS

## 2024-07-31 NOTE — ED Notes (Addendum)
 Lab attempted to get labs on patient and unable to obtain blood

## 2024-07-31 NOTE — ED Provider Notes (Signed)
 [facility] Provider Note    Event Date/Time   First MD Initiated Contact with Patient 07/31/24 1758     (approximate)   History   Abnormal Lab (Hbg)   HPI  Joseph Gilmore is a 53 y.o. male who presents to the ED for evaluation of Abnormal Lab (Hbg)   I review an ID clinic visit from 11/25.  Recent infection with Serratia bacteremia due to to dialysis catheter infection.  Just completed 4 weeks of cefepime  on 12/1. History of autism, ESRD. CBC drawn yesterday with hemoglobin of 6.8.  Patient presents with his aunt/caregiver for evaluation of this low hemoglobin.  Patient is asymptomatic and they deny any bleeding symptoms at home.  TTS hemodialysis via catheter in the right groin  Physical Exam   Triage Vital Signs: ED Triage Vitals  Encounter Vitals Group     BP 07/31/24 1612 (!) 145/87     Girls Systolic BP Percentile --      Girls Diastolic BP Percentile --      Boys Systolic BP Percentile --      Boys Diastolic BP Percentile --      Pulse Rate 07/31/24 1612 85     Resp 07/31/24 1612 18     Temp 07/31/24 1612 98.6 F (37 C)     Temp Source 07/31/24 1612 Oral     SpO2 07/31/24 1612 99 %     Weight 07/31/24 1615 257 lb (116.6 kg)     Height 07/31/24 1615 5' 7 (1.702 m)     Head Circumference --      Peak Flow --      Pain Score 07/31/24 1614 0     Pain Loc --      Pain Education --      Exclude from Growth Chart --     Most recent vital signs: Vitals:   07/31/24 1823 07/31/24 1917  BP: (!) 156/89 (!) 166/100  Pulse: 84 88  Resp: 18   Temp:    SpO2: 100% 100%    General: Awake, no distress.  CV:  Good peripheral perfusion.  Resp:  Normal effort.  Abd:  No distention.  MSK:  No deformity noted.  Neuro:  No focal deficits appreciated. Other:     ED Results / Procedures / Treatments   Labs (all labs ordered are listed, but only abnormal results are displayed) Labs Reviewed  COMPREHENSIVE METABOLIC PANEL WITH GFR -  Abnormal; Notable for the following components:      Result Value   BUN 32 (*)    Creatinine, Ser 14.20 (*)    Calcium  7.8 (*)    GFR, Estimated 4 (*)    Anion gap 17 (*)    All other components within normal limits  CBC - Abnormal; Notable for the following components:   WBC 10.6 (*)    RBC 2.49 (*)    Hemoglobin 7.1 (*)    HCT 23.8 (*)    MCHC 29.8 (*)    RDW 18.3 (*)    All other components within normal limits  TYPE AND SCREEN  PREPARE RBC (CROSSMATCH)    EKG   RADIOLOGY   Official radiology report(s): No results found.  PROCEDURES and INTERVENTIONS:  .Critical Care  Performed by: Claudene Rover, MD Authorized by: Claudene Rover, MD   Critical care provider statement:    Critical care time (minutes):  30   Critical care time was exclusive of:  Separately billable procedures and  treating other patients   Critical care was necessary to treat or prevent imminent or life-threatening deterioration of the following conditions:  Circulatory failure   Critical care was time spent personally by me on the following activities:  Development of treatment plan with patient or surrogate, discussions with consultants, evaluation of patient's response to treatment, examination of patient, ordering and review of laboratory studies, ordering and review of radiographic studies, ordering and performing treatments and interventions, pulse oximetry, re-evaluation of patient's condition and review of old charts   Medications  0.9 %  sodium chloride  infusion (10 mL/hr Intravenous New Bag/Given 07/31/24 1938)     IMPRESSION / MDM / ASSESSMENT AND PLAN / ED COURSE  I reviewed the triage vital signs and the nursing notes.  Differential diagnosis includes, but is not limited to, blood loss anemia, anemia of chronic disease, laboratory error  {Patient presents with symptoms of an acute illness or injury that is potentially life-threatening.  Dialysis patient presents with slowly downtrending  hemoglobin without bleeding symptoms or any acute symptoms.  Hemodynamically stable.  Blood work with hemoglobin right at 7, 6.8 as an outpatient yesterday.  Stigmata of ESRD without emergent indications for hemodialysis.  Discussion with patient and his aunt/caregiver at the bedside they would like to go ahead and get transfused 1 unit of blood which I think is reasonable.  Will type and screen and transfuse 1 unit PRBC.  If this is uneventful he may be suitable for outpatient management after this to ensure he can make it to dialysis tomorrow.      FINAL CLINICAL IMPRESSION(S) / ED DIAGNOSES   Final diagnoses:  Normocytic anemia  ESRD (end stage renal disease) (HCC)  Anemia of chronic disease     Rx / DC Orders   ED Discharge Orders     None        Note:  This document was prepared using Dragon voice recognition software and may include unintentional dictation errors.   Claudene Rover, MD 07/31/24 2003

## 2024-07-31 NOTE — ED Triage Notes (Signed)
 Patient arrives with his aunt states she received a call from dialysis that patients hemoglobin was 6.8 when drawn yesterday. Patient has no complaints. Denies any weakness.

## 2024-07-31 NOTE — Discharge Instructions (Signed)
Continue dialysis as scheduled.

## 2024-08-01 ENCOUNTER — Other Ambulatory Visit: Payer: Self-pay

## 2024-08-01 LAB — TYPE AND SCREEN
ABO/RH(D): O POS
Antibody Screen: NEGATIVE
Unit division: 0

## 2024-08-01 LAB — BPAM RBC
Blood Product Expiration Date: 202601102359
ISSUE DATE / TIME: 202512032022
Unit Type and Rh: 5100

## 2024-08-01 NOTE — Patient Instructions (Signed)
 Visit Information  Thank you for taking time to visit with me today. Please don't hesitate to contact me if I can be of assistance to you before our next scheduled telephone appointment.  Our next appointment is by telephone on Thursday December 11th at 10:00am  Following is a copy of your care plan:   Goals Addressed             This Visit's Progress    VBCI Transitions of Care (TOC) Care Plan       Problems: (reviewed 08/01/24) Recent Hospitalization for treatment of ESRD and Bacteremia Hospital or ED Adm Risk of 93%  Goal:  (reviewed 08/01/24) Over the next 30 days, the patient will not experience hospital readmission  Interventions: (reviewed 08/01/24)    Chronic Kidney Disease Interventions: Reviewed prescribed diet Low Sodium Heart Healthy Reviewed medications with patient and discussed importance of compliance    Discussed plans with patient for ongoing care management follow up and provided patient with direct contact information for care management team    Assessed social determinant of health barriers    Attend Dialysis three times a week - Tuesday, Thursday, Saturday Last practice recorded BP readings:  BP Readings from Last 3 Encounters:  07/06/24 135/81  06/27/24 134/74  06/19/24 123/75   Most recent eGFR/CrCl:  Lab Results  Component Value Date   EGFR 91 05/06/2024    No components found for: CRCL Call MD for: persistant dizziness or light-headedness Call MD for: persistant nausea and vomiting Call MD for: redness, tenderness, or signs of infection (pain, swelling, redness, odor or green/yellow discharge around incision site) Call MD for: severe uncontrolled pain Call MD for: temperature >100.4 11/17 - The patient went to the ER on 11/13 due to a K+ level of 8. He did not get the graft for his arm. Rescheduled.  11/26 - The patient went to the ER on 11/22 for dry cough and low BP. No changes 12/4 - The patient went to the ER due to Hgb 6.8. The patient  was transfused with a unit of blood. Less SOB and has more energy   Patient Self Care Activities:  (reviewed 08/01/24) Attend all scheduled provider appointments Call pharmacy for medication refills 3-7 days in advance of running out of medications Call provider office for new concerns or questions  Notify RN Care Manager of [facility] call rescheduling needs Participate in Transition of Care Program/Attend [facility] scheduled calls Take medications as prescribed    Plan:  Telephone follow up appointment with care management team member scheduled for:  Thursday December 11th at 10:00am        Patient verbalizes understanding of instructions and care plan provided today and agrees to view in MyChart. Active MyChart status and patient understanding of how to access instructions and care plan via MyChart confirmed with patient.     The patient has been provided with contact information for the care management team and has been advised to call with any health related questions or concerns.   Please call the care guide team at [phone] if you need to cancel or reschedule your appointment.   Please call the Suicide and Crisis Lifeline: 988 call the [place]  National Suicide Prevention Lifeline: 616-365-2398 or TTY: 336-475-7280 TTY 650-475-4828) to talk to a trained counselor if you are experiencing a Mental Health or Behavioral Health Crisis or need someone to talk to.  Medford Balboa, BSN, RN [place]  VBCI - [employer] [facility] RN Care Manager [phone]

## 2024-08-01 NOTE — Transitions of Care (Post Inpatient/ED Visit) (Signed)
 Transition of Care week 4  Visit Note  08/01/2024  Name: Joseph Gilmore MRN: [id number]          DOB: 1971-01-01  Situation: Patient enrolled in [facility] 30-day program. Visit completed with [name] by telephone.   Background:   Initial Transition Care Management Follow-up Telephone Call Discharge Date and Diagnosis: 07/06/24, Sepsis   Past Medical History:  Diagnosis Date   Autism    BPH (benign prostatic hyperplasia)    Complication of anesthesia    from 06-26-24 vascular procedure-pt woke up agitated and then became somnolent with no response with Narcan -admitted overnight to ICU   Dialysis patient    T, Th, Sat   ESRD (end stage renal disease) (HCC)    Hypertension    IDA (iron deficiency anemia)    Murmur    Obesity (BMI 30-39.9)    OCD (obsessive compulsive disorder)    Sepsis (HCC) 06/30/2024    Assessment: Patient Reported Symptoms: Cognitive Cognitive Status: Unable to Assess Cognitive/Intellectual Conditions Management [RPT]: Autism Spectrum Disorder      Neurological Neurological Review of Symptoms: No symptoms reported    HEENT HEENT Symptoms Reported: No symptoms reported      Cardiovascular Cardiovascular Symptoms Reported: Fatigue, Other: Other Cardiovascular Symptoms: SOB Does patient have uncontrolled Hypertension?: No Cardiovascular Management Strategies: Medication therapy Cardiovascular Comment: The patient went to the ED due to low hgb level of 6.8. He was transfused one unit of blood  Respiratory Respiratory Symptoms Reported: Dry cough Additional Respiratory Details: Still has a little cough from a cold Respiratory Management Strategies: Adequate rest  Endocrine Endocrine Symptoms Reported: No symptoms reported Is patient diabetic?: No    Gastrointestinal Gastrointestinal Symptoms Reported: No symptoms reported      Genitourinary Genitourinary Symptoms Reported: Other Additional Genitourinary Details: Dialysis Genitourinary  Management Strategies: Hemodialysis Hemodialysis Schedule: Tuesday, Thursday,Saturday Hemodialysis Last Treatment: 08/01/24  Integumentary Integumentary Symptoms Reported: Wound Additional Integumentary Details: Dialysis access to right groin Skin Management Strategies: Routine screening, Dressing changes Skin Comment: The Dialysis Center takes care of the access site  Musculoskeletal Musculoskelatal Symptoms Reviewed: No symptoms reported        Psychosocial Psychosocial Symptoms Reported: Not assessed         There were no vitals filed for this visit.    Medications Reviewed Today     Reviewed by Moises Reusing, RN (Case Manager) on 08/01/24 at 1035  Med List Status: <None>   Medication Order Taking? Sig Documenting Provider Last Dose Status Informant  calcium  acetate (PHOSLO ) 667 MG capsule [id number]  Take 667 mg by mouth 3 (three) times daily with meals. [provider]  Active Family Member  Cholecalciferol  (VITAMIN D -3) 125 MCG (5000 UT) TABS [id number]  Take 5,000 Units by mouth daily. [provider]  Active Family Member  midodrine  (PROAMATINE ) 10 MG tablet [id number]  Take 1 tablet (10 mg total) by mouth 3 (three) times daily with meals. Cox, Amy N, DO  Active   multivitamin (RENA-VIT) TABS tablet [id number]  Take 1 tablet by mouth daily. [provider]  Active Family Member  tamsulosin  (FLOMAX ) 0.4 MG CAPS capsule [id number]  TAKE ONE CAPSULE BY MOUTH DAILY Stoioff, Glendia BROCKS, MD  Active Family Member            Recommendation:   Continue Current Plan of Care  Follow Up Plan:   Telephone follow-up in 1 week  Medford Moises, BSN, RN   VBCI - [facility] RN Care Manager [phone]

## 2024-08-08 ENCOUNTER — Other Ambulatory Visit: Payer: Self-pay

## 2024-08-08 ENCOUNTER — Other Ambulatory Visit: Payer: Self-pay | Admitting: Urology

## 2024-08-08 NOTE — Transitions of Care (Post Inpatient/ED Visit) (Signed)
°  Transition of Care week 5  Visit Note  08/08/2024  Name: Joseph Gilmore MRN: [id number]          DOB: 02-02-1971  Situation: Patient enrolled in [facility] 30-day program. Visit completed with Violetta Arts by telephone.   Background:   Initial Transition Care Management Follow-up Telephone Call Discharge Date and Diagnosis: 07/06/24, Sepsis   Past Medical History:  Diagnosis Date   Autism    BPH (benign prostatic hyperplasia)    Complication of anesthesia    from 06-26-24 vascular procedure-pt woke up agitated and then became somnolent with no response with Narcan -admitted overnight to ICU   Dialysis patient    T, Th, Sat   ESRD (end stage renal disease) (HCC)    Hypertension    IDA (iron deficiency anemia)    Murmur    Obesity (BMI 30-39.9)    OCD (obsessive compulsive disorder)    Sepsis (HCC) 06/30/2024    Assessment: Patient Reported Symptoms: Cognitive Cognitive Status: Unable to Assess Cognitive/Intellectual Conditions Management [RPT]: Autism Spectrum Disorder      Neurological Neurological Review of Symptoms: No symptoms reported Neurological Self-Management Outcome: 2 (bad)  HEENT HEENT Symptoms Reported: No symptoms reported      Cardiovascular Cardiovascular Symptoms Reported: No symptoms reported Other Cardiovascular Symptoms: The patient received an infusion last week which boosted his energy Does patient have uncontrolled Hypertension?: No Cardiovascular Management Strategies: Medication therapy  Respiratory Respiratory Symptoms Reported: Dry cough    Endocrine Endocrine Symptoms Reported: Not assessed    Gastrointestinal Gastrointestinal Symptoms Reported: No symptoms reported      Genitourinary Other Genitourinary Symptoms: HD Genitourinary Management Strategies: Hemodialysis Hemodialysis Schedule: Tueday, Tursday, Saturday Hemodialysis Last Treatment: 08/08/24  Integumentary Integumentary Symptoms Reported: Not assessed    Musculoskeletal  Musculoskelatal Symptoms Reviewed: No symptoms reported        Psychosocial Psychosocial Symptoms Reported: Not assessed         There were no vitals filed for this visit.    Medications Reviewed Today     Reviewed by Moises Reusing, RN (Case Manager) on 08/08/24 at 1033  Med List Status: <None>   Medication Order Taking? Sig Documenting Provider Last Dose Status Informant  calcium  acetate (PHOSLO ) 667 MG capsule [id number]  Take 667 mg by mouth 3 (three) times daily with meals. [provider]  Active Family Member  Cholecalciferol  (VITAMIN D -3) 125 MCG (5000 UT) TABS [id number]  Take 5,000 Units by mouth daily. [provider]  Active Family Member  midodrine  (PROAMATINE ) 10 MG tablet [id number]  Take 1 tablet (10 mg total) by mouth 3 (three) times daily with meals. Cox, Amy N, DO  Active   multivitamin (RENA-VIT) TABS tablet [id number]  Take 1 tablet by mouth daily. [provider]  Active Family Member  tamsulosin  (FLOMAX ) 0.4 MG CAPS capsule [id number]  TAKE ONE CAPSULE BY MOUTH DAILY Stoioff, Glendia BROCKS, MD  Active Family Member            Recommendation:   Discahrge from the [facility] Program. Goals met  Follow Up Plan:   Closing From:  Transitions of Care Program  [facility], BSN, RN   VBCI - [facility] RN Care Manager [phone]

## 2024-08-08 NOTE — Patient Instructions (Signed)
 Visit Information  Thank you for taking time to visit with me today. Please don't hesitate to contact me if I can be of assistance to you before our next scheduled telephone appointment.   Following is a copy of your care plan:   Goals Addressed             This Visit's Progress    COMPLETED: VBCI Transitions of Care (TOC) Care Plan       Problems: (reviewed 08/08/24) Recent Hospitalization for treatment of ESRD and Bacteremia Hospital or ED Adm Risk of 93%  Goal:  (reviewed 08/08/24) Over the next 30 days, the patient will not experience hospital readmission  Interventions: (reviewed 08/08/24)    Chronic Kidney Disease Interventions: Reviewed prescribed diet Low Sodium Heart Healthy Reviewed medications with patient and discussed importance of compliance    Discussed plans with patient for ongoing care management follow up and provided patient with direct contact information for care management team    Assessed social determinant of health barriers    Attend Dialysis three times a week - Tuesday, Thursday, Saturday Last practice recorded BP readings:  BP Readings from Last 3 Encounters:  07/06/24 135/81  06/27/24 134/74  06/19/24 123/75   Most recent eGFR/CrCl:  Lab Results  Component Value Date   EGFR 91 05/06/2024    No components found for: CRCL Call MD for: persistant dizziness or light-headedness Call MD for: persistant nausea and vomiting Call MD for: redness, tenderness, or signs of infection (pain, swelling, redness, odor or green/yellow discharge around incision site) Call MD for: severe uncontrolled pain Call MD for: temperature >100.4 11/17 - The patient went to the ER on 11/13 due to a K+ level of 8. He did not get the graft for his arm. Rescheduled.  11/26 - The patient went to the ER on 11/22 for dry cough and low BP. No changes 12/4 - The patient went to the ER due to Hgb 6.8. The patient was transfused with a unit of blood. Less SOB and has more  energy 08/08/24 - More energy today. No concerns   Patient Self Care Activities:  (reviewed 08/08/24) Attend all scheduled provider appointments Call pharmacy for medication refills 3-7 days in advance of running out of medications Call provider office for new concerns or questions  Notify RN Care Manager of [facility] call rescheduling needs Participate in Transition of Care Program/Attend TOC scheduled calls Take medications as prescribed    Plan:  Telephone follow up appointment with care management team member scheduled for:  The patient has met the goals of the TOC 30 day program        Patient verbalizes understanding of instructions and care plan provided today and agrees to view in MyChart. Active MyChart status and patient understanding of how to access instructions and care plan via MyChart confirmed with patient.     The patient has been provided with contact information for the care management team and has been advised to call with any health related questions or concerns.   Please call the care guide team at [phone] if you need to cancel or reschedule your appointment.   Please call the Suicide and Crisis Lifeline: 988 call the [place]  National Suicide Prevention Lifeline: (541)249-3332 or TTY: 5635386799 TTY (763)261-6679) to talk to a trained counselor if you are experiencing a Mental Health or Behavioral Health Crisis or need someone to talk to.  Medford Balboa, BSN, RN [place]  VBCI - [employer] [facility] RN Care Manager [phone]

## 2024-08-09 ENCOUNTER — Other Ambulatory Visit

## 2024-08-10 LAB — LIPID PANEL
Chol/HDL Ratio: 2.4 ratio (ref 0.0–5.0)
Cholesterol, Total: 153 mg/dL (ref 100–199)
HDL: 63 mg/dL (ref >39)
LDL Chol Calc (NIH): 75 mg/dL (ref 0–99)
Triglycerides: 80 mg/dL (ref 0–149)
VLDL Cholesterol Cal: 15 mg/dL (ref 5–40)

## 2024-08-16 ENCOUNTER — Ambulatory Visit: Payer: Self-pay | Admitting: Internal Medicine

## 2024-08-16 ENCOUNTER — Ambulatory Visit: Admitting: Internal Medicine

## 2024-08-16 VITALS — BP 121/78 | HR 93 | Temp 99.2°F | Ht 67.0 in | Wt 251.6 lb

## 2024-08-16 DIAGNOSIS — E782 Mixed hyperlipidemia: Secondary | ICD-10-CM

## 2024-08-16 DIAGNOSIS — I10 Essential (primary) hypertension: Secondary | ICD-10-CM | POA: Diagnosis not present

## 2024-08-16 DIAGNOSIS — N185 Chronic kidney disease, stage 5: Secondary | ICD-10-CM | POA: Diagnosis not present

## 2024-08-16 NOTE — Progress Notes (Signed)
 "  Established Patient Office Visit  Subjective:  Patient ID: Joseph Gilmore, male    DOB: 07-Jun-1971  Age: 53 y.o. MRN: [id number]  Chief Complaint  Patient presents with   Follow-up    3 month lab results     No new complaints, here for lab review and medication refills. LDL and TC well controlled on lab review. Triglycerides also satisfactory while BP is well controlled today.     No other concerns at this time.   Past Medical History:  Diagnosis Date   Autism    BPH (benign prostatic hyperplasia)    Complication of anesthesia    from 06-26-24 vascular procedure-pt woke up agitated and then became somnolent with no response with Narcan -admitted overnight to ICU   Dialysis patient    T, Th, Sat   ESRD (end stage renal disease) (HCC)    Hypertension    IDA (iron deficiency anemia)    Murmur    Obesity (BMI 30-39.9)    OCD (obsessive compulsive disorder)    Sepsis (HCC) 06/30/2024    Past Surgical History:  Procedure Laterality Date   A/V FISTULAGRAM Left 10/07/2020   Procedure: A/V FISTULAGRAM;  Surgeon: Jama Cordella [name], MD;  Location: [facility];  Service: Cardiovascular;  Laterality: Left;   A/V FISTULAGRAM Left 02/15/2023   Procedure: A/V Fistulagram;  Surgeon: Jama Cordella [name], MD;  Location: [facility];  Service: Cardiovascular;  Laterality: Left;   A/V FISTULAGRAM Left 11/28/2023   Procedure: A/V Fistulagram;  Surgeon: Jama Cordella [name], MD;  Location: [facility];  Service: Cardiovascular;  Laterality: Left;   A/V FISTULAGRAM Left 12/26/2023   Procedure: A/V Fistulagram;  Surgeon: Jama Cordella [name], MD;  Location: [facility];  Service: Cardiovascular;  Laterality: Left;   AV FISTULA PLACEMENT Left 07/15/2020   Procedure: ARTERIOVENOUS FISTULA CREATION;  Surgeon: Jama Cordella [name], MD;  Location: [facility];  Service: Vascular;  Laterality: Left;   AV FISTULA PLACEMENT Left 10/04/2023   Procedure: INSERTION OF ARTERIOVENOUS  (AV) GORE-TEX GRAFT ARM (BRACHIAL AXILLARY);  Surgeon: Jama Cordella [name], MD;  Location: [facility];  Service: Vascular;  Laterality: Left;   DIALYSIS/PERMA CATHETER INSERTION N/A 04/06/2020   Procedure: DIALYSIS/PERMA CATHETER INSERTION;  Surgeon: Marea Selinda RAMAN, MD;  Location: [facility];  Service: Cardiovascular;  Laterality: N/A;   DIALYSIS/PERMA CATHETER INSERTION N/A 05/26/2020   Procedure: DIALYSIS/PERMA CATHETER INSERTION;  Surgeon: Jama Cordella [name], MD;  Location: [facility];  Service: Cardiovascular;  Laterality: N/A;   DIALYSIS/PERMA CATHETER INSERTION N/A 07/09/2020   Procedure: DIALYSIS/PERMA CATHETER INSERTION;  Surgeon: Marea Selinda RAMAN, MD;  Location: [facility];  Service: Cardiovascular;  Laterality: N/A;   DIALYSIS/PERMA CATHETER INSERTION N/A 01/08/2021   Procedure: DIALYSIS/PERMA CATHETER INSERTION;  Surgeon: Marea Selinda RAMAN, MD;  Location: [facility];  Service: Cardiovascular;  Laterality: N/A;   DIALYSIS/PERMA CATHETER INSERTION N/A 03/17/2023   Procedure: DIALYSIS/PERMA CATHETER INSERTION;  Surgeon: Jama Cordella [name], MD;  Location: [facility];  Service: Cardiovascular;  Laterality: N/A;   DIALYSIS/PERMA CATHETER INSERTION N/A 06/26/2024   Procedure: DIALYSIS/PERMA CATHETER INSERTION;  Surgeon: Marea Selinda RAMAN, MD;  Location: [facility];  Service: Cardiovascular;  Laterality: N/A;   DIALYSIS/PERMA CATHETER INSERTION N/A 07/05/2024   Procedure: DIALYSIS/PERMA CATHETER INSERTION;  Surgeon: Jama Cordella [name], MD;  Location: [facility];  Service: Cardiovascular;  Laterality: N/A;   DIALYSIS/PERMA CATHETER INSERTION N/A 07/12/2024  Procedure: DIALYSIS/PERMA CATHETER INSERTION;  Surgeon: Marea Selinda RAMAN, MD;  Location: [facility];  Service: Cardiovascular;  Laterality: N/A;   DIALYSIS/PERMA CATHETER REMOVAL N/A 03/15/2021   Procedure: DIALYSIS/PERMA CATHETER REMOVAL;  Surgeon: Marea Selinda RAMAN, MD;  Location: [facility];  Service: Cardiovascular;  Laterality: N/A;   DIALYSIS/PERMA CATHETER REMOVAL N/A 07/01/2024   Procedure: DIALYSIS/PERMA CATHETER REMOVAL;  Surgeon: Jama Cordella [name], MD;  Location: [facility];  Service: Cardiovascular;  Laterality: N/A;   REVISON OF ARTERIOVENOUS FISTULA Left 12/11/2020   Procedure: ARTERIOVENOUS (AV) FISTULA CREATION ( BRACHIAL CEPHALIC );  Surgeon: Jama Cordella [name], MD;  Location: [facility];  Service: Vascular;  Laterality: Left;   TEMPORARY DIALYSIS CATHETER N/A 03/15/2023   Procedure: TEMPORARY DIALYSIS CATHETER;  Surgeon: Jama Cordella [name], MD;  Location: [facility];  Service: Cardiovascular;  Laterality: N/A;    Social History   Socioeconomic History   Marital status: Single    Spouse name: Not on file   Number of children: Not on file   Years of education: Not on file   Highest education level: Not on file  Occupational History   Not on file  Tobacco Use   Smoking status: Never    Passive exposure: Never   Smokeless tobacco: Never  Vaping Use   Vaping status: Never Used  Substance and Sexual Activity   Alcohol use: Never   Drug use: Never   Sexual activity: Not Currently  Other Topics Concern   Not on file  Social History Narrative   Lives with Aunt [name]    Social Drivers of Health   Tobacco Use: Low Risk (07/31/2024)   Patient History    Smoking Tobacco Use: Never    Smokeless Tobacco Use: Never    Passive Exposure: Never  Financial Resource Strain: Not on file  Food Insecurity: No Food Insecurity (07/08/2024)   Epic    Worried About Programme Researcher, Broadcasting/film/video in the Last Year: Never true    Ran Out of Food in the Last Year: Never true  Transportation Needs: No Transportation Needs (07/08/2024)   Epic    Lack of Transportation (Medical): No    Lack of Transportation (Non-Medical): No  Physical Activity: Not on file  Stress: Not on file  Social Connections: Not on file  Intimate Partner Violence: Not At Risk  (07/08/2024)   Epic    Fear of Current or Ex-Partner: No    Emotionally Abused: No    Physically Abused: No    Sexually Abused: No  Depression (PHQ2-9): Low Risk (04/30/2024)   Depression (PHQ2-9)    PHQ-2 Score: 0  Alcohol Screen: Not on file  Housing: Unknown (07/08/2024)   Epic    Unable to Pay for Housing in the Last Year: No    Number of Times Moved in the Last Year: Not on file    Homeless in the Last Year: No  Utilities: Not At Risk (07/08/2024)   Epic    Threatened with loss of utilities: No  Health Literacy: Not on file    No family history on file.  Allergies[1]  Show/hide medication list[2]  Review of Systems  Constitutional:  Negative for weight loss.  HENT: Negative.    Eyes: Negative.   Respiratory: Negative.    Cardiovascular: Negative.   Gastrointestinal: Negative.   Genitourinary: Negative.   Skin: Negative.        As in hpi  Neurological: Negative.   Endo/Heme/Allergies: Negative.  Objective:   BP 121/78   Pulse 93   Temp 99.2 F (37.3 C)   Ht 5' 7 (1.702 m)   Wt 251 lb 9.6 oz (114.1 kg)   SpO2 98%   BMI 39.41 kg/m   Vitals:   08/16/24 1455  BP: 121/78  Pulse: 93  Temp: 99.2 F (37.3 C)  Height: 5' 7 (1.702 m)  Weight: 251 lb 9.6 oz (114.1 kg)  SpO2: 98%  BMI (Calculated): 39.4    Physical Exam Vitals reviewed.  Constitutional:      Appearance: Normal appearance. He is obese.  HENT:     Head: Normocephalic.     Left Ear: There is no impacted cerumen.     Nose: Nose normal.     Mouth/Throat:     Mouth: Mucous membranes are moist.     Pharynx: No posterior oropharyngeal erythema.  Eyes:     Extraocular Movements: Extraocular movements intact.     Pupils: Pupils are equal, round, and reactive to light.  Cardiovascular:     Rate and Rhythm: Regular rhythm.     Chest Wall: PMI is not displaced.     Pulses: Normal pulses.     Heart sounds: Normal heart sounds. No murmur heard.    Comments: Right groin HD  catheter Pulmonary:     Effort: Pulmonary effort is normal.     Breath sounds: Normal air entry. Rhonchi present. No rales.  Abdominal:     General: Abdomen is flat. Bowel sounds are normal. There is no distension.     Palpations: Abdomen is soft. There is no hepatomegaly, splenomegaly or mass.     Tenderness: There is no abdominal tenderness.  Musculoskeletal:        General: Normal range of motion.     Cervical back: Normal range of motion and neck supple.     Right lower leg: No edema.     Left lower leg: No edema.  Skin:    General: Skin is warm and dry.  Neurological:     General: No focal deficit present.     Mental Status: He is alert and oriented to person, place, and time.     Cranial Nerves: No cranial nerve deficit.     Motor: No weakness.  Psychiatric:        Mood and Affect: Mood normal.        Behavior: Behavior normal.      No results found for any visits on 08/16/24.      Assessment & Plan:  Kein was seen today for follow-up.  Mixed hyperlipidemia  Essential hypertension  CKD (chronic kidney disease) stage 5, GFR less than 15 ml/min (HCC)    Problem List Items Addressed This Visit       Cardiovascular and Mediastinum   Essential hypertension   Other Visit Diagnoses       Mixed hyperlipidemia    -  Primary     CKD (chronic kidney disease) stage 5, GFR less than 15 ml/min (HCC)           Return in about 4 months (around 12/15/2024).   Total time spent: 20 minutes. This time includes review of previous notes and results and patient face to face interaction during today'Stashia Sia visit.    Sherrill Cinderella Perry, MD  08/16/2024   This document may have been prepared by [facility] Voice Recognition software and as such may include unintentional dictation errors.     [1]  Allergies Allergen Reactions  Benadryl  [Diphenhydramine ] Other (See Comments)    Suspected hypersensitivity to benadryl  during past visit. Use with caution. Pt became obtunded  and hypotensive.    Chlorhexidine  Other (See Comments)    Blisters (topical)  [2]  Outpatient Medications Prior to Visit  Medication Sig   calcium  acetate (PHOSLO ) 667 MG capsule Take 667 mg by mouth 3 (three) times daily with meals.   Cholecalciferol  (VITAMIN D -3) 125 MCG (5000 UT) TABS Take 5,000 Units by mouth daily.   midodrine  (PROAMATINE ) 10 MG tablet Take 1 tablet (10 mg total) by mouth 3 (three) times daily with meals.   multivitamin (RENA-VIT) TABS tablet Take 1 tablet by mouth daily.   tamsulosin  (FLOMAX ) 0.4 MG CAPS capsule TAKE ONE CAPSULE BY MOUTH DAILY   No facility-administered medications prior to visit.   "

## 2024-09-04 ENCOUNTER — Encounter: Payer: Self-pay | Admitting: Cardiology

## 2024-09-04 ENCOUNTER — Ambulatory Visit: Admitting: Cardiology

## 2024-09-04 ENCOUNTER — Ambulatory Visit (INDEPENDENT_AMBULATORY_CARE_PROVIDER_SITE_OTHER): Admitting: Cardiology

## 2024-09-04 VITALS — BP 178/104 | HR 86 | Ht 67.0 in | Wt 249.8 lb

## 2024-09-04 DIAGNOSIS — I129 Hypertensive chronic kidney disease with stage 1 through stage 4 chronic kidney disease, or unspecified chronic kidney disease: Secondary | ICD-10-CM | POA: Diagnosis not present

## 2024-09-04 DIAGNOSIS — I10 Essential (primary) hypertension: Secondary | ICD-10-CM

## 2024-09-04 MED ORDER — LOSARTAN POTASSIUM 25 MG PO TABS: 25.0000 mg | ORAL_TABLET | Freq: Every day | ORAL | 0 refills | Status: DC

## 2024-09-04 NOTE — Progress Notes (Addendum)
 "  Established Patient Office Visit  Subjective:  Patient ID: Joseph Gilmore, male    DOB: [birth year]  Age: 54 y.o. MRN: [id number]  Chief Complaint  Patient presents with   Hypertension    Patient in office for an acute visit, reports blood pressure elevated per dialysis. Patient doing well, no complaints today. Patient blood pressure normal at 08/16/24 ov. Patient denies any changes. Reports not eating a lot of salt over the holidays. Mother in room for visit, states patient eats potato chips, fried/fast food. Patient has taken losartan  in the past, will restart losartan  25 mg. Return in 2 weeks for follow up on blood pressure.     No other concerns at this time.   Past Medical History:  Diagnosis Date   Autism    BPH (benign prostatic hyperplasia)    Complication of anesthesia    from 06-26-24 vascular procedure-pt woke up agitated and then became somnolent with no response with Narcan -admitted overnight to ICU   Dialysis patient    T, Th, Sat   ESRD (end stage renal disease) (HCC)    Hypertension    Hypertensive urgency 04/03/2020   IDA (iron deficiency anemia)    Murmur    Obesity (BMI 30-39.9)    OCD (obsessive compulsive disorder)    Sepsis (HCC) 06/30/2024    Past Surgical History:  Procedure Laterality Date   A/V FISTULAGRAM Left 10/07/2020   Procedure: A/V FISTULAGRAM;  Surgeon: Jama Cordella [name], MD;  Location: [facility];  Service: Cardiovascular;  Laterality: Left;   A/V FISTULAGRAM Left 02/15/2023   Procedure: A/V Fistulagram;  Surgeon: Jama Cordella [name], MD;  Location: [facility];  Service: Cardiovascular;  Laterality: Left;   A/V FISTULAGRAM Left 11/28/2023   Procedure: A/V Fistulagram;  Surgeon: Jama Cordella [name], MD;  Location: [facility];  Service: Cardiovascular;  Laterality: Left;   A/V FISTULAGRAM Left 12/26/2023   Procedure: A/V Fistulagram;  Surgeon: Jama Cordella [name], MD;  Location: [facility];  Service:  Cardiovascular;  Laterality: Left;   AV FISTULA PLACEMENT Left 07/15/2020   Procedure: ARTERIOVENOUS FISTULA CREATION;  Surgeon: Jama Cordella [name], MD;  Location: [facility];  Service: Vascular;  Laterality: Left;   AV FISTULA PLACEMENT Left 10/04/2023   Procedure: INSERTION OF ARTERIOVENOUS (AV) GORE-TEX GRAFT ARM (BRACHIAL AXILLARY);  Surgeon: Jama Cordella [name], MD;  Location: [facility];  Service: Vascular;  Laterality: Left;   DIALYSIS/PERMA CATHETER INSERTION N/A 04/06/2020   Procedure: DIALYSIS/PERMA CATHETER INSERTION;  Surgeon: Marea Selinda RAMAN, MD;  Location: [facility];  Service: Cardiovascular;  Laterality: N/A;   DIALYSIS/PERMA CATHETER INSERTION N/A 05/26/2020   Procedure: DIALYSIS/PERMA CATHETER INSERTION;  Surgeon: Jama Cordella [name], MD;  Location: [facility];  Service: Cardiovascular;  Laterality: N/A;   DIALYSIS/PERMA CATHETER INSERTION N/A 07/09/2020   Procedure: DIALYSIS/PERMA CATHETER INSERTION;  Surgeon: Marea Selinda RAMAN, MD;  Location: [facility];  Service: Cardiovascular;  Laterality: N/A;   DIALYSIS/PERMA CATHETER INSERTION N/A 01/08/2021   Procedure: DIALYSIS/PERMA CATHETER INSERTION;  Surgeon: Marea Selinda RAMAN, MD;  Location: [facility];  Service: Cardiovascular;  Laterality: N/A;   DIALYSIS/PERMA CATHETER INSERTION N/A 03/17/2023   Procedure: DIALYSIS/PERMA CATHETER INSERTION;  Surgeon: Jama Cordella [name], MD;  Location: [facility];  Service: Cardiovascular;  Laterality: N/A;   DIALYSIS/PERMA CATHETER INSERTION N/A 06/26/2024   Procedure: DIALYSIS/PERMA CATHETER INSERTION;  Surgeon: Marea Selinda RAMAN, MD;  Location: [facility];  Service:  Cardiovascular;  Laterality: N/A;   DIALYSIS/PERMA CATHETER INSERTION N/A 07/05/2024   Procedure: DIALYSIS/PERMA CATHETER INSERTION;  Surgeon: Jama Cordella [name], MD;  Location: [facility];  Service: Cardiovascular;  Laterality: N/A;   DIALYSIS/PERMA CATHETER INSERTION N/A 07/12/2024    Procedure: DIALYSIS/PERMA CATHETER INSERTION;  Surgeon: Marea Selinda RAMAN, MD;  Location: [facility];  Service: Cardiovascular;  Laterality: N/A;   DIALYSIS/PERMA CATHETER REMOVAL N/A 03/15/2021   Procedure: DIALYSIS/PERMA CATHETER REMOVAL;  Surgeon: Marea Selinda RAMAN, MD;  Location: [facility];  Service: Cardiovascular;  Laterality: N/A;   DIALYSIS/PERMA CATHETER REMOVAL N/A 07/01/2024   Procedure: DIALYSIS/PERMA CATHETER REMOVAL;  Surgeon: Jama Cordella [name], MD;  Location: [facility];  Service: Cardiovascular;  Laterality: N/A;   REVISON OF ARTERIOVENOUS FISTULA Left 12/11/2020   Procedure: ARTERIOVENOUS (AV) FISTULA CREATION ( BRACHIAL CEPHALIC );  Surgeon: Jama Cordella [name], MD;  Location: [facility];  Service: Vascular;  Laterality: Left;   TEMPORARY DIALYSIS CATHETER N/A 03/15/2023   Procedure: TEMPORARY DIALYSIS CATHETER;  Surgeon: Jama Cordella [name], MD;  Location: [facility];  Service: Cardiovascular;  Laterality: N/A;    Social History   Socioeconomic History   Marital status: Single    Spouse name: Not on file   Number of children: Not on file   Years of education: Not on file   Highest education level: Not on file  Occupational History   Not on file  Tobacco Use   Smoking status: Never    Passive exposure: Never   Smokeless tobacco: Never  Vaping Use   Vaping status: Never Used  Substance and Sexual Activity   Alcohol use: Never   Drug use: Never   Sexual activity: Not Currently  Other Topics Concern   Not on file  Social History Narrative   Lives with Aunt [name]    Social Drivers of Health   Tobacco Use: Low Risk (09/04/2024)   Patient History    Smoking Tobacco Use: Never    Smokeless Tobacco Use: Never    Passive Exposure: Never  Financial Resource Strain: Not on file  Food Insecurity: No Food Insecurity (07/08/2024)   Epic    Worried About Programme Researcher, Broadcasting/film/video in the Last Year: Never true    Ran Out of Food in the Last  Year: Never true  Transportation Needs: No Transportation Needs (07/08/2024)   Epic    Lack of Transportation (Medical): No    Lack of Transportation (Non-Medical): No  Physical Activity: Not on file  Stress: Not on file  Social Connections: Not on file  Intimate Partner Violence: Not At Risk (07/08/2024)   Epic    Fear of Current or Ex-Partner: No    Emotionally Abused: No    Physically Abused: No    Sexually Abused: No  Depression (PHQ2-9): Low Risk (04/30/2024)   Depression (PHQ2-9)    PHQ-2 Score: 0  Alcohol Screen: Not on file  Housing: Unknown (07/08/2024)   Epic    Unable to Pay for Housing in the Last Year: No    Number of Times Moved in the Last Year: Not on file    Homeless in the Last Year: No  Utilities: Not At Risk (07/08/2024)   Epic    Threatened with loss of utilities: No  Health Literacy: Not on file    History reviewed. No pertinent family history.  Allergies[1]  Show/hide medication list[2]  Review of Systems  Constitutional: Negative.   HENT: Negative.  Eyes: Negative.   Respiratory: Negative.  Negative for shortness of breath.   Cardiovascular: Negative.  Negative for chest pain.  Gastrointestinal: Negative.  Negative for abdominal pain, constipation and diarrhea.  Genitourinary: Negative.   Musculoskeletal:  Negative for joint pain and myalgias.  Skin: Negative.   Neurological: Negative.  Negative for dizziness and headaches.  Endo/Heme/Allergies: Negative.   All other systems reviewed and are negative.      Objective:   BP (!) 178/104   Pulse 86   Ht 5' 7 (1.702 m)   Wt 249 lb 12.8 oz (113.3 kg)   SpO2 98%   BMI 39.12 kg/m   Vitals:   09/04/24 0912  BP: (!) 178/104  Pulse: 86  Height: 5' 7 (1.702 m)  Weight: 249 lb 12.8 oz (113.3 kg)  SpO2: 98%  BMI (Calculated): 39.12    Physical Exam Nursing note reviewed.  Constitutional:      Appearance: Normal appearance. He is normal weight.  HENT:     Head: Normocephalic and  atraumatic.     Nose: Nose normal.     Mouth/Throat:     Mouth: Mucous membranes are moist.     Pharynx: Oropharynx is clear.  Eyes:     Extraocular Movements: Extraocular movements intact.     Conjunctiva/sclera: Conjunctivae normal.     Pupils: Pupils are equal, round, and reactive to light.  Cardiovascular:     Rate and Rhythm: Normal rate and regular rhythm.     Pulses: Normal pulses.     Heart sounds: Normal heart sounds.  Pulmonary:     Effort: Pulmonary effort is normal.     Breath sounds: Normal breath sounds.  Abdominal:     General: Abdomen is flat. Bowel sounds are normal.     Palpations: Abdomen is soft.  Musculoskeletal:        General: Normal range of motion.     Cervical back: Normal range of motion.  Skin:    General: Skin is warm and dry.  Neurological:     General: No focal deficit present.     Mental Status: He is alert and oriented to person, place, and time.  Psychiatric:        Mood and Affect: Mood normal.        Behavior: Behavior normal.        Thought Content: Thought content normal.        Judgment: Judgment normal.      No results found for any visits on 09/04/24.  Recent Results (from the past 2160 hours)  Potassium [facility] vascular lab only)     Status: None   Collection Time: 06/26/24 10:53 AM  Result Value Ref Range   Potassium [facility] vascular lab) 4 3.5 - 5.1 mmol/L    Comment: Performed at [facility], [address] [place], [place] [address]  Glucose, capillary     Status: None   Collection Time: 06/26/24  1:09 PM  Result Value Ref Range   Glucose-Capillary 90 70 - 99 mg/dL    Comment: Glucose reference range applies only to samples taken after fasting for at least 8 hours.  Glucose, capillary     Status: Abnormal   Collection Time: 06/26/24  3:16 PM  Result Value Ref Range   Glucose-Capillary 233 (H) 70 - 99 mg/dL    Comment: Glucose reference range applies only to samples taken after fasting for at least 8 hours.   MRSA Next Gen by PCR, Nasal  Status: None   Collection Time: 06/26/24  3:19 PM   Specimen: Nasal Mucosa; Nasal Swab  Result Value Ref Range   MRSA by PCR Next Gen NOT DETECTED NOT DETECTED    Comment: (NOTE) The GeneXpert MRSA Assay (FDA approved for NASAL specimens only), is one component of a comprehensive MRSA colonization surveillance program. It is not intended to diagnose MRSA infection nor to guide or monitor treatment for MRSA infections. Test performance is not FDA approved in patients less than 87 years old. Performed at [facility], [address] [place], [place] [address]   HIV Antibody (routine testing w rflx)     Status: None   Collection Time: 06/26/24  3:41 PM  Result Value Ref Range   HIV Screen 4th Generation wRfx Non Reactive Non Reactive    Comment: Performed at [facility], [address] [place], [place] [address]  CBC     Status: Abnormal   Collection Time: 06/26/24  3:41 PM  Result Value Ref Range   WBC 15.4 (H) 4.0 - 10.5 K/uL   RBC 2.94 (L) 4.22 - 5.81 MIL/uL   Hemoglobin 9.3 (L) 13.0 - 17.0 g/dL   HCT 70.5 (L) 60.9 - 47.9 %   MCV 100.0 80.0 - 100.0 fL   MCH 31.6 26.0 - 34.0 pg   MCHC 31.6 30.0 - 36.0 g/dL   RDW 81.0 (H) 88.4 - 84.4 %   Platelets 232 150 - 400 K/uL   nRBC 0.0 0.0 - 0.2 %    Comment: Performed at [facility], [address] [place], [place] [address]  Comprehensive metabolic panel     Status: Abnormal   Collection Time: 06/26/24  3:41 PM  Result Value Ref Range   Sodium 142 135 - 145 mmol/L    Comment: ELECTROLYTES REPEATED TO VERIFY MU   Potassium 3.2 (L) 3.5 - 5.1 mmol/L   Chloride 99 98 - 111 mmol/L   CO2 16 (L) 22 - 32 mmol/L   Glucose, Bld 252 (H) 70 - 99 mg/dL    Comment: Glucose reference range applies only to samples taken after fasting for at least 8 hours.   BUN 39 (H) 6 - 20 mg/dL   Creatinine, Ser 84.01 (H) 0.61 - 1.24 mg/dL   Calcium  7.5 (L) 8.9 - 10.3 mg/dL   Total Protein 6.0  (L) 6.5 - 8.1 g/dL   Albumin 3.1 (L) 3.5 - 5.0 g/dL   AST 92 (H) 15 - 41 U/L   ALT 99 (H) 0 - 44 U/L   Alkaline Phosphatase 51 38 - 126 U/L   Total Bilirubin 0.7 0.0 - 1.2 mg/dL   GFR, Estimated 3 (L) >60 mL/min    Comment: (NOTE) Calculated using the CKD-EPI Creatinine Equation (2021)    Anion gap 27 (H) 5 - 15    Comment: Performed at [facility], [address] [place], [place] [address]  Hemoglobin A1c     Status: None   Collection Time: 06/26/24  3:41 PM  Result Value Ref Range   Hgb A1c MFr Bld 5.2 4.8 - 5.6 %    Comment: (NOTE) Diagnosis of Diabetes The following HbA1c ranges recommended by the American Diabetes Association (ADA) may be used as an aid in the diagnosis of diabetes mellitus.  Hemoglobin             Suggested A1C NGSP%              Diagnosis  <5.7  Non Diabetic  5.7-6.4                Pre-Diabetic  >6.4                   Diabetic  <7.0                   Glycemic control for                       adults with diabetes.     Mean Plasma Glucose 102.54 mg/dL    Comment: Performed at [facility], [address] [place], [place] [address]  Magnesium      Status: None   Collection Time: 06/26/24  3:41 PM  Result Value Ref Range   Magnesium  2.4 1.7 - 2.4 mg/dL    Comment: Performed at [facility], [address] [place], [place] [address]  Glucose, capillary     Status: Abnormal   Collection Time: 06/26/24  4:38 PM  Result Value Ref Range   Glucose-Capillary 223 (H) 70 - 99 mg/dL    Comment: Glucose reference range applies only to samples taken after fasting for at least 8 hours.  Blood gas, venous     Status: Abnormal   Collection Time: 06/26/24  8:18 PM  Result Value Ref Range   pH, Ven 7.28 7.25 - 7.43   pCO2, Ven 39 (L) 44 - 60 mmHg   pO2, Ven 37 32 - 45 mmHg   Bicarbonate 18.3 (L) 20.0 - 28.0 mmol/L   Acid-base deficit 7.9 (H) 0.0 - 2.0 mmol/L   O2 Saturation 49 %   Patient temperature 37.0     Collection site VEIN     Comment: Performed at [facility], [address] [place], [place] [address]  Glucose, capillary     Status: Abnormal   Collection Time: 06/26/24 10:28 PM  Result Value Ref Range   Glucose-Capillary 117 (H) 70 - 99 mg/dL    Comment: Glucose reference range applies only to samples taken after fasting for at least 8 hours.  Glucose, capillary     Status: Abnormal   Collection Time: 06/27/24  7:31 AM  Result Value Ref Range   Glucose-Capillary 118 (H) 70 - 99 mg/dL    Comment: Glucose reference range applies only to samples taken after fasting for at least 8 hours.  Glucose, capillary     Status: Abnormal   Collection Time: 06/27/24  1:05 PM  Result Value Ref Range   Glucose-Capillary 108 (H) 70 - 99 mg/dL    Comment: Glucose reference range applies only to samples taken after fasting for at least 8 hours.  Comprehensive metabolic panel     Status: Abnormal   Collection Time: 06/30/24  1:37 PM  Result Value Ref Range   Sodium 137 135 - 145 mmol/L    Comment: ELECTROLYTES REPEATED TO VERIFY MJU   Potassium 4.3 3.5 - 5.1 mmol/L   Chloride 94 (L) 98 - 111 mmol/L   CO2 21 (L) 22 - 32 mmol/L   Glucose, Bld 92 70 - 99 mg/dL    Comment: Glucose reference range applies only to samples taken after fasting for at least 8 hours.   BUN 56 (H) 6 - 20 mg/dL   Creatinine, Ser 84.43 (H) 0.61 - 1.24 mg/dL   Calcium  7.8 (L) 8.9 - 10.3 mg/dL   Total Protein 7.5 6.5 - 8.1 g/dL   Albumin 3.1 (L) 3.5 -  5.0 g/dL   AST 31 15 - 41 U/L   ALT 18 0 - 44 U/L   Alkaline Phosphatase 59 38 - 126 U/L   Total Bilirubin 0.6 0.0 - 1.2 mg/dL   GFR, Estimated 3 (L) >60 mL/min    Comment: (NOTE) Calculated using the CKD-EPI Creatinine Equation (2021)    Anion gap 22 (H) 5 - 15    Comment: Performed at [facility], [address] [place], [place] [address]  CBC with Differential     Status: Abnormal   Collection Time: 06/30/24  1:37 PM  Result Value Ref Range   WBC  12.6 (H) 4.0 - 10.5 K/uL   RBC 3.29 (L) 4.22 - 5.81 MIL/uL   Hemoglobin 10.2 (L) 13.0 - 17.0 g/dL   HCT 68.2 (L) 60.9 - 47.9 %   MCV 96.4 80.0 - 100.0 fL   MCH 31.0 26.0 - 34.0 pg   MCHC 32.2 30.0 - 36.0 g/dL   RDW 81.2 (H) 88.4 - 84.4 %   Platelets 150 150 - 400 K/uL   nRBC 0.0 0.0 - 0.2 %   Neutrophils Relative % 82 %   Neutro Abs 10.3 (H) 1.7 - 7.7 K/uL   Lymphocytes Relative 8 %   Lymphs Abs 1.0 0.7 - 4.0 K/uL   Monocytes Relative 7 %   Monocytes Absolute 0.9 0.1 - 1.0 K/uL   Eosinophils Relative 1 %   Eosinophils Absolute 0.1 0.0 - 0.5 K/uL   Basophils Relative 0 %   Basophils Absolute 0.1 0.0 - 0.1 K/uL   WBC Morphology MORPHOLOGY UNREMARKABLE    Immature Granulocytes 2 %   Abs Immature Granulocytes 0.19 (H) 0.00 - 0.07 K/uL   Polychromasia PRESENT     Comment: Performed at [facility], [address] [place], [place] [address]  Protime-INR     Status: Abnormal   Collection Time: 06/30/24  1:37 PM  Result Value Ref Range   Prothrombin Time 16.9 (H) 11.4 - 15.2 seconds   INR 1.3 (H) 0.8 - 1.2    Comment: (NOTE) INR goal varies based on device and disease states. Performed at [facility], [address] [place], [place] [address]   Culture, blood (Routine x 2)     Status: Abnormal   Collection Time: 06/30/24  1:37 PM   Specimen: BLOOD  Result Value Ref Range   Specimen Description      BLOOD RIGHT ANTECUBITAL Performed at [facility], [address] [place], [place] [address]    Special Requests      BOTTLES DRAWN AEROBIC AND ANAEROBIC Blood Culture adequate volume Performed at [facility], [address] [place], [place] [address]    Culture  Setup Time      GRAM NEGATIVE RODS IN BOTH AEROBIC AND ANAEROBIC BOTTLES CRITICAL RESULT CALLED TO, READ BACK BY AND VERIFIED WITH: MADISON HUNT PHARM.D 06/30/24 1004 KG Performed at [facility], [address] [place], [place] [address]    Culture SERRATIA  MARCESCENS (A)    Report Status 07/04/2024 FINAL    Organism ID, Bacteria SERRATIA MARCESCENS       Susceptibility   Serratia marcescens - MIC*    CEFEPIME  <=0.12 SENSITIVE Sensitive     ERTAPENEM <=0.12 SENSITIVE Sensitive     CEFTRIAXONE  <=0.25 SENSITIVE Sensitive     CIPROFLOXACIN 0.12 SENSITIVE Sensitive     GENTAMICIN <=1 SENSITIVE Sensitive     MEROPENEM <=0.25 SENSITIVE Sensitive     TRIMETH/SULFA <=  20 SENSITIVE Sensitive     * SERRATIA MARCESCENS  Troponin I (High Sensitivity)     Status: Abnormal   Collection Time: 06/30/24  1:37 PM  Result Value Ref Range   Troponin I (High Sensitivity) 122 (HH) <18 ng/L    Comment: CRITICAL RESULT CALLED TO, READ BACK BY AND VERIFIED WITH HECTOR GARCIA @1456  06/30/24 MJU (NOTE) Elevated high sensitivity troponin I (hsTnI) values and significant  changes across serial measurements may suggest ACS but many other  chronic and acute conditions are known to elevate hsTnI results.  Refer to the Links section for chest pain algorithms and additional  guidance. Performed at [facility], [address] [place], [place] [address]   Blood Culture ID Panel (Reflexed)     Status: Abnormal   Collection Time: 06/30/24  1:37 PM  Result Value Ref Range   Enterococcus faecalis NOT DETECTED NOT DETECTED   Enterococcus Faecium NOT DETECTED NOT DETECTED   Listeria monocytogenes NOT DETECTED NOT DETECTED   Staphylococcus species NOT DETECTED NOT DETECTED   Staphylococcus aureus (BCID) NOT DETECTED NOT DETECTED   Staphylococcus epidermidis NOT DETECTED NOT DETECTED   Staphylococcus lugdunensis NOT DETECTED NOT DETECTED   Streptococcus species NOT DETECTED NOT DETECTED   Streptococcus agalactiae NOT DETECTED NOT DETECTED   Streptococcus pneumoniae NOT DETECTED NOT DETECTED   Streptococcus pyogenes NOT DETECTED NOT DETECTED   A.calcoaceticus-baumannii NOT DETECTED NOT DETECTED   Bacteroides fragilis NOT DETECTED NOT DETECTED    Enterobacterales DETECTED (A) NOT DETECTED    Comment: Enterobacterales represent a large order of gram negative bacteria, not a single organism. CRITICAL RESULT CALLED TO, READ BACK BY AND VERIFIED WITH: MADISON HUNT PHARM.D 07/01/24 1004 KG    Enterobacter cloacae complex NOT DETECTED NOT DETECTED   Escherichia coli NOT DETECTED NOT DETECTED   Klebsiella aerogenes NOT DETECTED NOT DETECTED   Klebsiella oxytoca NOT DETECTED NOT DETECTED   Klebsiella pneumoniae NOT DETECTED NOT DETECTED   Proteus species NOT DETECTED NOT DETECTED   Salmonella species NOT DETECTED NOT DETECTED   Serratia marcescens DETECTED (A) NOT DETECTED    Comment: CRITICAL RESULT CALLED TO, READ BACK BY AND VERIFIED WITH: MADISON HUNT PHARM.D 07/01/24 1004 KG    Haemophilus influenzae NOT DETECTED NOT DETECTED   Neisseria meningitidis NOT DETECTED NOT DETECTED   Pseudomonas aeruginosa NOT DETECTED NOT DETECTED   Stenotrophomonas maltophilia NOT DETECTED NOT DETECTED   Candida albicans NOT DETECTED NOT DETECTED   Candida auris NOT DETECTED NOT DETECTED   Candida glabrata NOT DETECTED NOT DETECTED   Candida krusei NOT DETECTED NOT DETECTED   Candida parapsilosis NOT DETECTED NOT DETECTED   Candida tropicalis NOT DETECTED NOT DETECTED   Cryptococcus neoformans/gattii NOT DETECTED NOT DETECTED   CTX-M ESBL NOT DETECTED NOT DETECTED   Carbapenem resistance IMP NOT DETECTED NOT DETECTED   Carbapenem resistance KPC NOT DETECTED NOT DETECTED   Carbapenem resistance NDM NOT DETECTED NOT DETECTED   Carbapenem resist OXA 48 LIKE NOT DETECTED NOT DETECTED   Carbapenem resistance VIM NOT DETECTED NOT DETECTED    Comment: Performed at [facility], [address] [place], [place] [address]  Culture, blood (Routine x 2)     Status: Abnormal   Collection Time: 06/30/24  2:59 PM   Specimen: BLOOD RIGHT FOREARM  Result Value Ref Range   Specimen Description      BLOOD RIGHT FOREARM Performed at [facility], [address] [place], [place] [address]    Special Requests  BOTTLES DRAWN AEROBIC AND ANAEROBIC Blood Culture adequate volume Performed at [facility], [address] [place], [place] [address]    Culture  Setup Time      GRAM NEGATIVE RODS IN BOTH AEROBIC AND ANAEROBIC BOTTLES CRITICAL VALUE NOTED.  VALUE IS CONSISTENT WITH PREVIOUSLY REPORTED AND CALLED VALUE. Performed at [facility], [address] [place], [place] [address]    Culture (A)     SERRATIA MARCESCENS SUSCEPTIBILITIES PERFORMED ON PREVIOUS CULTURE WITHIN THE LAST 5 DAYS. Performed at [facility], [address] [place], [place] [address]    Report Status 07/04/2024 FINAL   Lactic acid, plasma     Status: None   Collection Time: 06/30/24  3:39 PM  Result Value Ref Range   Lactic Acid, Venous 1.2 0.5 - 1.9 mmol/L    Comment: Performed at [facility], [address] [place], [place] [address]  Resp panel by RT-PCR (RSV, Flu A&B, Covid) Anterior Nasal Swab     Status: None   Collection Time: 06/30/24  3:39 PM   Specimen: Anterior Nasal Swab  Result Value Ref Range   SARS Coronavirus 2 by RT PCR NEGATIVE NEGATIVE    Comment: (NOTE) SARS-CoV-2 target nucleic acids are NOT DETECTED.  The SARS-CoV-2 RNA is generally detectable in upper respiratory specimens during the acute phase of infection. The lowest concentration of SARS-CoV-2 viral copies this assay can detect is 138 copies/mL. A negative result does not preclude SARS-Cov-2 infection and should not be used as the sole basis for treatment or other patient management decisions. A negative result may occur with  improper specimen collection/handling, submission of specimen other than nasopharyngeal swab, presence of viral mutation(s) within the areas targeted by this assay, and inadequate number of viral copies(<138 copies/mL). A negative result must be combined with clinical observations, patient history, and  epidemiological information. The expected result is Negative.  Fact Sheet for Patients:  bloggercourse.com  Fact Sheet for Healthcare Providers:  seriousbroker.it  This test is no t yet approved or cleared by the [place]  FDA and  has been authorized for detection and/or diagnosis of SARS-CoV-2 by FDA under an Emergency Use Authorization (EUA). This EUA will remain  in effect (meaning this test can be used) for the duration of the COVID-19 declaration under Section 564(b)(1) of the Act, 21 U.S.C.section 360bbb-3(b)(1), unless the authorization is terminated  or revoked sooner.       Influenza A by PCR NEGATIVE NEGATIVE   Influenza B by PCR NEGATIVE NEGATIVE    Comment: (NOTE) The Xpert Xpress SARS-CoV-2/FLU/RSV plus assay is intended as an aid in the diagnosis of influenza from Nasopharyngeal swab specimens and should not be used as a sole basis for treatment. Nasal washings and aspirates are unacceptable for Xpert Xpress SARS-CoV-2/FLU/RSV testing.  Fact Sheet for Patients: bloggercourse.com  Fact Sheet for Healthcare Providers: seriousbroker.it  This test is not yet approved or cleared by the [place]  FDA and has been authorized for detection and/or diagnosis of SARS-CoV-2 by FDA under an Emergency Use Authorization (EUA). This EUA will remain in effect (meaning this test can be used) for the duration of the COVID-19 declaration under Section 564(b)(1) of the Act, 21 U.S.C. section 360bbb-3(b)(1), unless the authorization is terminated or revoked.     Resp Syncytial Virus by PCR NEGATIVE NEGATIVE    Comment: (NOTE) Fact Sheet for Patients: bloggercourse.com  Fact Sheet for Healthcare Providers: seriousbroker.it  This test is not yet approved or cleared by  the [place]  FDA and has been authorized for  detection and/or diagnosis of SARS-CoV-2 by FDA under an Emergency Use Authorization (EUA). This EUA will remain in effect (meaning this test can be used) for the duration of the COVID-19 declaration under Section 564(b)(1) of the Act, 21 U.S.C. section 360bbb-3(b)(1), unless the authorization is terminated or revoked.  Performed at [facility], [address] [place], [place] [address]   Troponin I (High Sensitivity)     Status: Abnormal   Collection Time: 06/30/24  6:44 PM  Result Value Ref Range   Troponin I (High Sensitivity) 93 (H) <18 ng/L    Comment: (NOTE) Elevated high sensitivity troponin I (hsTnI) values and significant  changes across serial measurements may suggest ACS but many other  chronic and acute conditions are known to elevate hsTnI results.  Refer to the Links section for chest pain algorithms and additional  guidance. Performed at [facility], [address] [place], [place] [address]   Troponin I (High Sensitivity)     Status: Abnormal   Collection Time: 07/01/24  4:19 AM  Result Value Ref Range   Troponin I (High Sensitivity) 68 (H) <18 ng/L    Comment: (NOTE) Elevated high sensitivity troponin I (hsTnI) values and significant  changes across serial measurements may suggest ACS but many other  chronic and acute conditions are known to elevate hsTnI results.  Refer to the Links section for chest pain algorithms and additional  guidance. Performed at [facility], [address] [place], [place] [address]   CBC     Status: Abnormal   Collection Time: 07/01/24  4:19 AM  Result Value Ref Range   WBC 9.8 4.0 - 10.5 K/uL   RBC 2.65 (L) 4.22 - 5.81 MIL/uL   Hemoglobin 8.3 (L) 13.0 - 17.0 g/dL   HCT 75.1 (L) 60.9 - 47.9 %   MCV 93.6 80.0 - 100.0 fL   MCH 31.3 26.0 - 34.0 pg   MCHC 33.5 30.0 - 36.0 g/dL   RDW 81.3 (H) 88.4 - 84.4 %   Platelets 134 (L) 150 - 400 K/uL   nRBC 0.0 0.0 - 0.2 %    Comment: Performed at  [facility], [address], [place], [place] [address]  Basic metabolic panel     Status: Abnormal   Collection Time: 07/01/24  4:19 AM  Result Value Ref Range   Sodium 132 (L) 135 - 145 mmol/L   Potassium 3.8 3.5 - 5.1 mmol/L   Chloride 96 (L) 98 - 111 mmol/L   CO2 20 (L) 22 - 32 mmol/L   Glucose, Bld 81 70 - 99 mg/dL    Comment: Glucose reference range applies only to samples taken after fasting for at least 8 hours.   BUN 69 (H) 6 - 20 mg/dL   Creatinine, Ser 82.51 (H) 0.61 - 1.24 mg/dL   Calcium  6.6 (L) 8.9 - 10.3 mg/dL   GFR, Estimated 3 (L) >60 mL/min    Comment: (NOTE) Calculated using the CKD-EPI Creatinine Equation (2021)    Anion gap 16 (H) 5 - 15    Comment: Performed at [facility], [address] [place], [place] [address]  Respiratory (~20 pathogens) panel by PCR     Status: None   Collection Time: 07/01/24  7:46 AM   Specimen: Nasopharyngeal Swab; Respiratory  Result Value Ref Range   Adenovirus NOT DETECTED NOT DETECTED   Coronavirus 229E NOT DETECTED NOT DETECTED  Comment: (NOTE) The Coronavirus on the Respiratory Panel, DOES NOT test for the novel  Coronavirus (2019 nCoV)    Coronavirus HKU1 NOT DETECTED NOT DETECTED   Coronavirus NL63 NOT DETECTED NOT DETECTED   Coronavirus OC43 NOT DETECTED NOT DETECTED   Metapneumovirus NOT DETECTED NOT DETECTED   Rhinovirus / Enterovirus NOT DETECTED NOT DETECTED   Influenza A NOT DETECTED NOT DETECTED   Influenza B NOT DETECTED NOT DETECTED   Parainfluenza Virus 1 NOT DETECTED NOT DETECTED   Parainfluenza Virus 2 NOT DETECTED NOT DETECTED   Parainfluenza Virus 3 NOT DETECTED NOT DETECTED   Parainfluenza Virus 4 NOT DETECTED NOT DETECTED   Respiratory Syncytial Virus NOT DETECTED NOT DETECTED   Bordetella pertussis NOT DETECTED NOT DETECTED   Bordetella Parapertussis NOT DETECTED NOT DETECTED   Chlamydophila pneumoniae NOT DETECTED NOT DETECTED   Mycoplasma pneumoniae NOT DETECTED NOT  DETECTED    Comment: Performed at [facility], [address] [place], [place] [address]  MRSA Next Gen by PCR, Nasal     Status: None   Collection Time: 07/01/24  7:46 AM   Specimen: Nasal Mucosa; Nasal Swab  Result Value Ref Range   MRSA by PCR Next Gen NOT DETECTED NOT DETECTED    Comment: (NOTE) The GeneXpert MRSA Assay (FDA approved for NASAL specimens only), is one component of a comprehensive MRSA colonization surveillance program. It is not intended to diagnose MRSA infection nor to guide or monitor treatment for MRSA infections. Test performance is not FDA approved in patients less than 33 years old. Performed at [facility], [address], [place], [place] [address]   Cath Tip Culture     Status: Abnormal   Collection Time: 07/01/24  5:56 PM   Specimen: Catheter Tip; Other  Result Value Ref Range   Specimen Description      CATH TIP Performed at [facility], [address] [place], [place] [address]    Special Requests      NONE Performed at [facility], [address] [place], [place] [address]    Culture 10,000 COLONIES/mL SERRATIA MARCESCENS (A)    Report Status 07/04/2024 FINAL    Organism ID, Bacteria SERRATIA MARCESCENS (A)       Susceptibility   Serratia marcescens - MIC*    CEFEPIME  <=0.12 SENSITIVE Sensitive     ERTAPENEM <=0.12 SENSITIVE Sensitive     CEFTRIAXONE  <=0.25 SENSITIVE Sensitive     CIPROFLOXACIN 0.12 SENSITIVE Sensitive     GENTAMICIN <=1 SENSITIVE Sensitive     MEROPENEM <=0.25 SENSITIVE Sensitive     TRIMETH/SULFA <=20 SENSITIVE Sensitive     * 10,000 COLONIES/mL SERRATIA MARCESCENS  Renal function panel     Status: Abnormal   Collection Time: 07/03/24  8:14 AM  Result Value Ref Range   Sodium 137 135 - 145 mmol/L   Potassium 3.9 3.5 - 5.1 mmol/L   Chloride 98 98 - 111 mmol/L   CO2 22 22 - 32 mmol/L   Glucose, Bld 82 70 - 99 mg/dL    Comment: Glucose reference range applies only to samples  taken after fasting for at least 8 hours.   BUN 58 (H) 6 - 20 mg/dL   Creatinine, Ser 83.85 (H) 0.61 - 1.24 mg/dL   Calcium  6.9 (L) 8.9 - 10.3 mg/dL   Phosphorus 5.1 (H) 2.5 - 4.6 mg/dL   Albumin 2.6 (L) 3.5 - 5.0 g/dL   GFR, Estimated 3 (L) >60 mL/min  Comment: (NOTE) Calculated using the CKD-EPI Creatinine Equation (2021)    Anion gap 19 (H) 5 - 15    Comment: Performed at [facility], [address] [place], [place] [address]  Culture, blood (Routine X 2) w Reflex to ID Panel     Status: None   Collection Time: 07/03/24  9:24 AM   Specimen: BLOOD  Result Value Ref Range   Specimen Description BLOOD BLOOD RIGHT HAND    Special Requests      BOTTLES DRAWN AEROBIC AND ANAEROBIC Blood Culture adequate volume   Culture      NO GROWTH 5 DAYS Performed at [facility], [address] [place], [place] [address]    Report Status 07/08/2024 FINAL   Culture, blood (Routine X 2) w Reflex to ID Panel     Status: None   Collection Time: 07/03/24  9:24 AM   Specimen: BLOOD  Result Value Ref Range   Specimen Description BLOOD BLOOD RIGHT ARM    Special Requests      BOTTLES DRAWN AEROBIC AND ANAEROBIC Blood Culture adequate volume   Culture      NO GROWTH 5 DAYS Performed at [facility], [address] [place], [place] [address]    Report Status 07/08/2024 FINAL   CBC     Status: Abnormal   Collection Time: 07/04/24  3:29 AM  Result Value Ref Range   WBC 9.8 4.0 - 10.5 K/uL   RBC 2.68 (L) 4.22 - 5.81 MIL/uL   Hemoglobin 8.4 (L) 13.0 - 17.0 g/dL   HCT 74.9 (L) 60.9 - 47.9 %   MCV 93.3 80.0 - 100.0 fL   MCH 31.3 26.0 - 34.0 pg   MCHC 33.6 30.0 - 36.0 g/dL   RDW 81.7 (H) 88.4 - 84.4 %   Platelets 184 150 - 400 K/uL   nRBC 0.0 0.0 - 0.2 %    Comment: Performed at [facility], [address] [place], [place] [address]  Renal function panel     Status: Abnormal   Collection Time: 07/04/24  3:29 AM  Result Value Ref Range   Sodium 139  135 - 145 mmol/L   Potassium 4.3 3.5 - 5.1 mmol/L   Chloride 100 98 - 111 mmol/L   CO2 22 22 - 32 mmol/L   Glucose, Bld 82 70 - 99 mg/dL    Comment: Glucose reference range applies only to samples taken after fasting for at least 8 hours.   BUN 65 (H) 6 - 20 mg/dL   Creatinine, Ser 81.70 (H) 0.61 - 1.24 mg/dL   Calcium  6.7 (L) 8.9 - 10.3 mg/dL   Phosphorus 6.2 (H) 2.5 - 4.6 mg/dL   Albumin 2.5 (L) 3.5 - 5.0 g/dL   GFR, Estimated 3 (L) >60 mL/min    Comment: (NOTE) Calculated using the CKD-EPI Creatinine Equation (2021)    Anion gap 17 (H) 5 - 15    Comment: Performed at [facility], [address] [place], [place] [address]  ECHOCARDIOGRAM COMPLETE     Status: None   Collection Time: 07/04/24  8:18 AM  Result Value Ref Range   Weight 4,278.69 oz   Height 68 in   BP 132/75 mmHg   S' Lateral 2.24 cm   Est EF 60 - 65%   Hepatitis B surface antigen     Status: None   Collection Time: 07/04/24  4:46 PM  Result Value Ref Range   Hepatitis B Surface Ag NON  REACTIVE NON REACTIVE    Comment: Performed at [facility], [address] [place], [place] [address]  CBC     Status: Abnormal   Collection Time: 07/06/24  5:57 AM  Result Value Ref Range   WBC 10.3 4.0 - 10.5 K/uL   RBC 2.48 (L) 4.22 - 5.81 MIL/uL   Hemoglobin 7.7 (L) 13.0 - 17.0 g/dL   HCT 76.9 (L) 60.9 - 47.9 %   MCV 92.7 80.0 - 100.0 fL   MCH 31.0 26.0 - 34.0 pg   MCHC 33.5 30.0 - 36.0 g/dL   RDW 81.7 (H) 88.4 - 84.4 %   Platelets 270 150 - 400 K/uL   nRBC 0.0 0.0 - 0.2 %    Comment: Performed at [facility], [address] [place], [place] [address]  Basic metabolic panel     Status: Abnormal   Collection Time: 07/06/24  5:57 AM  Result Value Ref Range   Sodium 138 135 - 145 mmol/L   Potassium 4.8 3.5 - 5.1 mmol/L   Chloride 100 98 - 111 mmol/L   CO2 18 (L) 22 - 32 mmol/L   Glucose, Bld 86 70 - 99 mg/dL    Comment: Glucose reference range applies only to samples taken after fasting  for at least 8 hours.   BUN 75 (H) 6 - 20 mg/dL   Creatinine, Ser 78.00 (H) 0.61 - 1.24 mg/dL   Calcium  6.7 (L) 8.9 - 10.3 mg/dL   GFR, Estimated 2 (L) >60 mL/min    Comment: (NOTE) Calculated using the CKD-EPI Creatinine Equation (2021)    Anion gap 20 (H) 5 - 15    Comment: Performed at [facility], [address] [place], [place] [address]  Type and screen     Status: None   Collection Time: 07/10/24 10:27 AM  Result Value Ref Range   ABO/RH(D) O POS    Antibody Screen NEG    Sample Expiration 07/24/2024,2359    Extend sample reason      NO TRANSFUSIONS OR PREGNANCY IN THE PAST 3 MONTHS Performed at [facility], [address] [place], [place] [address]   CBC with Differential/Platelet     Status: Abnormal   Collection Time: 07/10/24 10:27 AM  Result Value Ref Range   WBC 10.6 (H) 4.0 - 10.5 K/uL   RBC 2.68 (L) 4.22 - 5.81 MIL/uL   Hemoglobin 8.4 (L) 13.0 - 17.0 g/dL   HCT 73.6 (L) 60.9 - 47.9 %   MCV 98.1 80.0 - 100.0 fL   MCH 31.3 26.0 - 34.0 pg   MCHC 31.9 30.0 - 36.0 g/dL   RDW 81.3 (H) 88.4 - 84.4 %   Platelets 283 150 - 400 K/uL   nRBC 0.0 0.0 - 0.2 %   Neutrophils Relative % 68 %   Neutro Abs 7.2 1.7 - 7.7 K/uL   Lymphocytes Relative 17 %   Lymphs Abs 1.8 0.7 - 4.0 K/uL   Monocytes Relative 10 %   Monocytes Absolute 1.1 (H) 0.1 - 1.0 K/uL   Eosinophils Relative 3 %   Eosinophils Absolute 0.3 0.0 - 0.5 K/uL   Basophils Relative 1 %   Basophils Absolute 0.1 0.0 - 0.1 K/uL   Immature Granulocytes 1 %   Abs Immature Granulocytes 0.14 (H) 0.00 - 0.07 K/uL    Comment: Performed at [facility], [address], [place], [place] [address]  CBC     Status: Abnormal   Collection  Time: 07/11/24  9:36 AM  Result Value Ref Range   WBC 9.3 4.0 - 10.5 K/uL   RBC 2.53 (L) 4.22 - 5.81 MIL/uL   Hemoglobin 7.9 (L) 13.0 - 17.0 g/dL   HCT 75.3 (L) 60.9 - 47.9 %   MCV 97.2 80.0 - 100.0 fL   MCH 31.2 26.0 - 34.0 pg   MCHC 32.1 30.0 - 36.0  g/dL   RDW 81.2 (H) 88.4 - 84.4 %   Platelets 225 150 - 400 K/uL   nRBC 0.0 0.0 - 0.2 %    Comment: Performed at [facility], [address], [place], [place] [address]  Basic metabolic panel     Status: Abnormal   Collection Time: 07/11/24  9:36 AM  Result Value Ref Range   Sodium 141 135 - 145 mmol/L   Potassium 4.5 3.5 - 5.1 mmol/L   Chloride 100 98 - 111 mmol/L   CO2 24 22 - 32 mmol/L   Glucose, Bld 89 70 - 99 mg/dL    Comment: Glucose reference range applies only to samples taken after fasting for at least 8 hours.   BUN 44 (H) 6 - 20 mg/dL   Creatinine, Ser 80.89 (H) 0.61 - 1.24 mg/dL   Calcium  7.3 (L) 8.9 - 10.3 mg/dL   GFR, Estimated 3 (L) >60 mL/min    Comment: (NOTE) Calculated using the CKD-EPI Creatinine Equation (2021)    Anion gap 17 (H) 5 - 15    Comment: Performed at [facility], [address] [place], [place] [address]  Potassium [facility] vascular lab only)     Status: None   Collection Time: 07/12/24 11:34 AM  Result Value Ref Range   Potassium [facility] vascular lab) 4.3 3.5 - 5.1 mmol/L    Comment: Performed at [facility], [address] [place], [place] [address]  Resp panel by RT-PCR (RSV, Flu A&B, Covid) Anterior Nasal Swab     Status: None   Collection Time: 07/20/24  4:54 PM   Specimen: Anterior Nasal Swab  Result Value Ref Range   SARS Coronavirus 2 by RT PCR NEGATIVE NEGATIVE    Comment: (NOTE) SARS-CoV-2 target nucleic acids are NOT DETECTED.  The SARS-CoV-2 RNA is generally detectable in upper respiratory specimens during the acute phase of infection. The lowest concentration of SARS-CoV-2 viral copies this assay can detect is 138 copies/mL. A negative result does not preclude SARS-Cov-2 infection and should not be used as the sole basis for treatment or other patient management decisions. A negative result may occur with  improper specimen collection/handling, submission of specimen other than nasopharyngeal swab,  presence of viral mutation(s) within the areas targeted by this assay, and inadequate number of viral copies(<138 copies/mL). A negative result must be combined with clinical observations, patient history, and epidemiological information. The expected result is Negative.  Fact Sheet for Patients:  bloggercourse.com  Fact Sheet for Healthcare Providers:  seriousbroker.it  This test is no t yet approved or cleared by the [place]  FDA and  has been authorized for detection and/or diagnosis of SARS-CoV-2 by FDA under an Emergency Use Authorization (EUA). This EUA will remain  in effect (meaning this test can be used) for the duration of the COVID-19 declaration under Section 564(b)(1) of the Act, 21 U.S.C.section 360bbb-3(b)(1), unless the authorization is terminated  or revoked sooner.       Influenza A by PCR NEGATIVE NEGATIVE   Influenza B by PCR NEGATIVE NEGATIVE    Comment: (NOTE)  The Xpert Xpress SARS-CoV-2/FLU/RSV plus assay is intended as an aid in the diagnosis of influenza from Nasopharyngeal swab specimens and should not be used as a sole basis for treatment. Nasal washings and aspirates are unacceptable for Xpert Xpress SARS-CoV-2/FLU/RSV testing.  Fact Sheet for Patients: bloggercourse.com  Fact Sheet for Healthcare Providers: seriousbroker.it  This test is not yet approved or cleared by the [place]  FDA and has been authorized for detection and/or diagnosis of SARS-CoV-2 by FDA under an Emergency Use Authorization (EUA). This EUA will remain in effect (meaning this test can be used) for the duration of the COVID-19 declaration under Section 564(b)(1) of the Act, 21 U.S.C. section 360bbb-3(b)(1), unless the authorization is terminated or revoked.     Resp Syncytial Virus by PCR NEGATIVE NEGATIVE    Comment: (NOTE) Fact Sheet for  Patients: bloggercourse.com  Fact Sheet for Healthcare Providers: seriousbroker.it  This test is not yet approved or cleared by the [place]  FDA and has been authorized for detection and/or diagnosis of SARS-CoV-2 by FDA under an Emergency Use Authorization (EUA). This EUA will remain in effect (meaning this test can be used) for the duration of the COVID-19 declaration under Section 564(b)(1) of the Act, 21 U.S.C. section 360bbb-3(b)(1), unless the authorization is terminated or revoked.  Performed at [facility], [address] [place], [place] [address]   CBC with Differential     Status: Abnormal   Collection Time: 07/20/24  5:19 PM  Result Value Ref Range   WBC 10.7 (H) 4.0 - 10.5 K/uL   RBC 2.23 (L) 4.22 - 5.81 MIL/uL   Hemoglobin 6.9 (L) 13.0 - 17.0 g/dL   HCT 78.3 (L) 60.9 - 47.9 %   MCV 96.9 80.0 - 100.0 fL   MCH 30.9 26.0 - 34.0 pg   MCHC 31.9 30.0 - 36.0 g/dL   RDW 82.5 (H) 88.4 - 84.4 %   Platelets 215 150 - 400 K/uL   nRBC 0.0 0.0 - 0.2 %   Neutrophils Relative % 75 %   Neutro Abs 8.1 (H) 1.7 - 7.7 K/uL   Lymphocytes Relative 12 %   Lymphs Abs 1.3 0.7 - 4.0 K/uL   Monocytes Relative 8 %   Monocytes Absolute 0.8 0.1 - 1.0 K/uL   Eosinophils Relative 4 %   Eosinophils Absolute 0.4 0.0 - 0.5 K/uL   Basophils Relative 1 %   Basophils Absolute 0.1 0.0 - 0.1 K/uL   Immature Granulocytes 0 %   Abs Immature Granulocytes 0.04 0.00 - 0.07 K/uL    Comment: Performed at [facility], [address] [place], [place] [address]  Comprehensive metabolic panel     Status: Abnormal   Collection Time: 07/20/24  5:19 PM  Result Value Ref Range   Sodium 139 135 - 145 mmol/L   Potassium 3.7 3.5 - 5.1 mmol/L   Chloride 96 (L) 98 - 111 mmol/L   CO2 27 22 - 32 mmol/L   Glucose, Bld 119 (H) 70 - 99 mg/dL    Comment: Glucose reference range applies only to samples taken after fasting for at least 8  hours.   BUN 24 (H) 6 - 20 mg/dL   Creatinine, Ser 89.79 (H) 0.61 - 1.24 mg/dL   Calcium  8.3 (L) 8.9 - 10.3 mg/dL   Total Protein 7.3 6.5 - 8.1 g/dL   Albumin 3.7 3.5 - 5.0 g/dL   AST 33 15 - 41 U/L   ALT 28 0 - 44 U/L  Alkaline Phosphatase 66 38 - 126 U/L   Total Bilirubin 0.4 0.0 - 1.2 mg/dL   GFR, Estimated 6 (L) >60 mL/min    Comment: (NOTE) Calculated using the CKD-EPI Creatinine Equation (2021)    Anion gap 16 (H) 5 - 15    Comment: Performed at [facility], [address] [place], [place] [address]  Procalcitonin     Status: None   Collection Time: 07/20/24  5:19 PM  Result Value Ref Range   Procalcitonin 1.56 ng/mL    Comment: (NOTE)   Sepsis PCT Algorithm          Lower Respiratory Tract Infection                                         PCT Algorithm -----------------------------------------------------------------  <0.5 ng/mL                    <0.10 ng/mL  Associated with low           Antibiotic therapy strongly   risk for progression          discouraged. Indicates absence   to severe sepsis              of bacteria infection  and/or septic shock             --------------------------------------------------------------  0.5-2.0 ng/mL                 0.10-0.25 ng/mL  Recommended to retest         Antibiotic therapy discouraged.  PCT within 6-24 hours         Bacterial infection unlikely  ------------------------------------------------------------  >2 ng/mL                      0.26-0.50 ng/mL  Associated with high risk     Antibiotic therapy encouraged.  for progression to severe     Bacterial infection possible  sepsis/and or septic shock    ------------------------------                                 >0.50 ng/mL                                Antibiotic therapy strongly                                 encouraged.                                Suggestive of presence of                                 bacterial infection.                                  -------------------------------------------------------------------  < or = 0.50 ng/mL OR          < or = 0.25 OR 80% decrease in PCT  80% decrease in PCT  Antibiotic therapy   Antibiotic therapy may        may be discontinued  be discontinued                                 Performed at [facility], [address] [place], [place] [address]   Lactic acid, plasma     Status: None   Collection Time: 07/20/24  5:19 PM  Result Value Ref Range   Lactic Acid, Venous 1.9 0.5 - 1.9 mmol/L    Comment: Performed at [facility], [address] [place], [place] [address]  Blood culture (routine x 2)     Status: None   Collection Time: 07/20/24  5:19 PM   Specimen: BLOOD  Result Value Ref Range   Specimen Description BLOOD RIGHT ANTECUBITAL    Special Requests      BOTTLES DRAWN AEROBIC AND ANAEROBIC Blood Culture adequate volume   Culture      NO GROWTH 5 DAYS Performed at [facility], [address] [place], [place] [address]    Report Status 07/25/2024 FINAL   Comprehensive metabolic panel     Status: Abnormal   Collection Time: 07/31/24  6:10 PM  Result Value Ref Range   Sodium 144 135 - 145 mmol/L   Potassium 4.3 3.5 - 5.1 mmol/L   Chloride 102 98 - 111 mmol/L   CO2 25 22 - 32 mmol/L   Glucose, Bld 78 70 - 99 mg/dL    Comment: Glucose reference range applies only to samples taken after fasting for at least 8 hours.   BUN 32 (H) 6 - 20 mg/dL   Creatinine, Ser 85.79 (H) 0.61 - 1.24 mg/dL   Calcium  7.8 (L) 8.9 - 10.3 mg/dL   Total Protein 7.6 6.5 - 8.1 g/dL   Albumin 3.7 3.5 - 5.0 g/dL   AST 30 15 - 41 U/L   ALT 33 0 - 44 U/L   Alkaline Phosphatase 75 38 - 126 U/L   Total Bilirubin 0.3 0.0 - 1.2 mg/dL   GFR, Estimated 4 (L) >60 mL/min    Comment: (NOTE) Calculated using the CKD-EPI Creatinine Equation (2021)    Anion gap 17 (H) 5 - 15    Comment: Performed at [facility], [address] [place], [place]  [address]  CBC     Status: Abnormal   Collection Time: 07/31/24  6:10 PM  Result Value Ref Range   WBC 10.6 (H) 4.0 - 10.5 K/uL   RBC 2.49 (L) 4.22 - 5.81 MIL/uL   Hemoglobin 7.1 (L) 13.0 - 17.0 g/dL   HCT 76.1 (L) 60.9 - 47.9 %   MCV 95.6 80.0 - 100.0 fL   MCH 28.5 26.0 - 34.0 pg   MCHC 29.8 (L) 30.0 - 36.0 g/dL   RDW 81.6 (H) 88.4 - 84.4 %   Platelets 333 150 - 400 K/uL   nRBC 0.2 0.0 - 0.2 %    Comment: Performed at [facility], [address] [place], [place] [address]  Type and screen [facility]     Status: None   Collection Time: 07/31/24  6:10 PM  Result Value Ref Range   ABO/RH(D) O POS    Antibody Screen NEG    Sample Expiration 08/03/2024,2359    Unit Number [id number]    Blood Component Type RED CELLS,LR    Unit division 00  Status of Unit ISSUED,FINAL    Transfusion Status OK TO TRANSFUSE    Crossmatch Result      Compatible Performed at [facility], [address] [place], [place] [address]   BPAM RBC     Status: None   Collection Time: 07/31/24  6:10 PM  Result Value Ref Range   ISSUE DATE / TIME [id number]    Blood Product Unit Number [id number]    PRODUCT CODE [id number]    Unit Type and Rh 5100    Blood Product Expiration Date [id number]   Prepare RBC (crossmatch)     Status: None   Collection Time: 07/31/24  7:16 PM  Result Value Ref Range   Order Confirmation      ORDER PROCESSED BY BLOOD BANK Performed at [facility], [address] [place], [place] [address]   Lipid panel     Status: None   Collection Time: 08/09/24  1:52 PM  Result Value Ref Range   Cholesterol, Total 153 100 - 199 mg/dL   Triglycerides 80 0 - 149 mg/dL   HDL 63 >60 mg/dL   VLDL Cholesterol Cal 15 5 - 40 mg/dL   LDL Chol Calc (NIH) 75 0 - 99 mg/dL   Chol/HDL Ratio 2.4 0.0 - 5.0 ratio    Comment:                                   T. Chol/HDL Ratio                                             Men  Women                                1/2 Avg.Risk  3.4    3.3                                   Avg.Risk  5.0    4.4                                2X Avg.Risk  9.6    7.1                                3X Avg.Risk 23.4   11.0       Assessment & Plan:  Start losartan  25 mg daily Decrease salt intake  Problem List Items Addressed This Visit       Cardiovascular and Mediastinum   Essential (primary) hypertension   Relevant Medications   losartan  (COZAAR ) 25 MG tablet     Genitourinary   Hypertensive chronic kidney disease with stage 1 through stage 4 chronic kidney disease, or unspecified chronic kidney disease - Primary    Return in about 2 weeks (around 09/18/2024) for with TJ, follow up blood pressure.   Total time spent: 25 minutes. This time includes review of previous notes and results and patient face to face interaction during today's visit.    Jeoffrey Pollen, NP  09/04/2024   This document may have been prepared  by [employer] and as such may include unintentional dictation errors.      [1]  Allergies Allergen Reactions   Benadryl  [Diphenhydramine ] Other (See Comments)    Suspected hypersensitivity to benadryl  during past visit. Use with caution. Pt became obtunded and hypotensive.    Chlorhexidine  Other (See Comments)    Blisters (topical)  [2]  Outpatient Medications Prior to Visit  Medication Sig   calcium  acetate (PHOSLO ) 667 MG capsule Take 667 mg by mouth 3 (three) times daily with meals.   Cholecalciferol  (VITAMIN D -3) 125 MCG (5000 UT) TABS Take 5,000 Units by mouth daily.   multivitamin (RENA-VIT) TABS tablet Take 1 tablet by mouth daily.   tamsulosin  (FLOMAX ) 0.4 MG CAPS capsule TAKE ONE CAPSULE BY MOUTH DAILY   midodrine  (PROAMATINE ) 10 MG tablet Take 1 tablet (10 mg total) by mouth 3 (three) times daily with meals. (Patient not taking: Reported on 09/04/2024)   No facility-administered medications prior to visit.   "

## 2024-09-18 ENCOUNTER — Encounter: Payer: Self-pay | Admitting: Urology

## 2024-09-18 ENCOUNTER — Ambulatory Visit (INDEPENDENT_AMBULATORY_CARE_PROVIDER_SITE_OTHER): Admitting: Urology

## 2024-09-18 VITALS — BP 179/114 | HR 60 | Ht 67.0 in | Wt 247.0 lb

## 2024-09-18 DIAGNOSIS — Z87898 Personal history of other specified conditions: Secondary | ICD-10-CM

## 2024-09-18 DIAGNOSIS — N401 Enlarged prostate with lower urinary tract symptoms: Secondary | ICD-10-CM

## 2024-09-18 MED ORDER — TAMSULOSIN HCL 0.4 MG PO CAPS: 0.4000 mg | ORAL_CAPSULE | Freq: Every day | ORAL | 3 refills | Status: AC

## 2024-09-18 MED ORDER — DUTASTERIDE 0.5 MG PO CAPS: 0.5000 mg | ORAL_CAPSULE | Freq: Every day | ORAL | 3 refills | Status: AC

## 2024-09-18 NOTE — Progress Notes (Signed)
 Patient presents for an office visit. BP today is High. Greater than 140/90. Provider  notified and recheck Blood Pressure  180 /118  Pt advised to talk with PCP.  Pt voiced understanding.

## 2024-09-18 NOTE — Progress Notes (Signed)
 "  09/18/2024 1:54 PM   Joseph Gilmore 1971/01/13 [id number]  Referring provider: Albina GORMAN Dine, MD [address] [place],  [place] [address]  Chief Complaint  Patient presents with   Medication Refill   Urologic history: 1.  History elevated PSA PSA 27 after Foley catheter placement; follow-up PSA in normal  2.  History urinary retention Resolved with Foley catheter placement On tamsulosin /dutasteride   HPI: Joseph Gilmore is a 54 y.o. male presents for follow-up.  He presents today with his aunt.  Remains on hemodialysis, nonoliguric Recently ran out of dutasteride  and tamsulosin  and a desire to continue PSA 04/2024 stable at 0.4 Urologic ROS otherwise noncontributory  PMH: Past Medical History:  Diagnosis Date   Autism    BPH (benign prostatic hyperplasia)    Complication of anesthesia    from 06-26-24 vascular procedure-pt woke up agitated and then became somnolent with no response with Narcan -admitted overnight to ICU   Dialysis patient    T, Th, Sat   ESRD (end stage renal disease) (HCC)    Hypertension    Hypertensive urgency 04/03/2020   IDA (iron deficiency anemia)    Murmur    Obesity (BMI 30-39.9)    OCD (obsessive compulsive disorder)    Sepsis (HCC) 06/30/2024    Surgical History: Past Surgical History:  Procedure Laterality Date   A/V FISTULAGRAM Left 10/07/2020   Procedure: A/V FISTULAGRAM;  Surgeon: Jama Cordella [name], MD;  Location: [facility];  Service: Cardiovascular;  Laterality: Left;   A/V FISTULAGRAM Left 02/15/2023   Procedure: A/V Fistulagram;  Surgeon: Jama Cordella [name], MD;  Location: [facility];  Service: Cardiovascular;  Laterality: Left;   A/V FISTULAGRAM Left 11/28/2023   Procedure: A/V Fistulagram;  Surgeon: Jama Cordella [name], MD;  Location: [facility];  Service: Cardiovascular;  Laterality: Left;   A/V FISTULAGRAM Left 12/26/2023   Procedure: A/V Fistulagram;  Surgeon: Jama Cordella [name], MD;   Location: [facility];  Service: Cardiovascular;  Laterality: Left;   AV FISTULA PLACEMENT Left 07/15/2020   Procedure: ARTERIOVENOUS FISTULA CREATION;  Surgeon: Jama Cordella [name], MD;  Location: [facility];  Service: Vascular;  Laterality: Left;   AV FISTULA PLACEMENT Left 10/04/2023   Procedure: INSERTION OF ARTERIOVENOUS (AV) GORE-TEX GRAFT ARM (BRACHIAL AXILLARY);  Surgeon: Jama Cordella [name], MD;  Location: [facility];  Service: Vascular;  Laterality: Left;   DIALYSIS/PERMA CATHETER INSERTION N/A 04/06/2020   Procedure: DIALYSIS/PERMA CATHETER INSERTION;  Surgeon: Marea Selinda GORMAN, MD;  Location: [facility];  Service: Cardiovascular;  Laterality: N/A;   DIALYSIS/PERMA CATHETER INSERTION N/A 05/26/2020   Procedure: DIALYSIS/PERMA CATHETER INSERTION;  Surgeon: Jama Cordella [name], MD;  Location: [facility];  Service: Cardiovascular;  Laterality: N/A;   DIALYSIS/PERMA CATHETER INSERTION N/A 07/09/2020   Procedure: DIALYSIS/PERMA CATHETER INSERTION;  Surgeon: Marea Selinda GORMAN, MD;  Location: [facility];  Service: Cardiovascular;  Laterality: N/A;   DIALYSIS/PERMA CATHETER INSERTION N/A 01/08/2021   Procedure: DIALYSIS/PERMA CATHETER INSERTION;  Surgeon: Marea Selinda GORMAN, MD;  Location: [facility];  Service: Cardiovascular;  Laterality: N/A;   DIALYSIS/PERMA CATHETER INSERTION N/A 03/17/2023   Procedure: DIALYSIS/PERMA CATHETER INSERTION;  Surgeon: Jama Cordella [name], MD;  Location: [facility];  Service: Cardiovascular;  Laterality: N/A;   DIALYSIS/PERMA CATHETER INSERTION N/A 06/26/2024   Procedure: DIALYSIS/PERMA CATHETER INSERTION;  Surgeon: Marea Selinda GORMAN, MD;  Location: [facility];  Service: Cardiovascular;  Laterality: N/A;  DIALYSIS/PERMA CATHETER INSERTION N/A 07/05/2024   Procedure: DIALYSIS/PERMA CATHETER INSERTION;  Surgeon: Jama Cordella [name], MD;  Location: [facility];  Service: Cardiovascular;  Laterality: N/A;   DIALYSIS/PERMA  CATHETER INSERTION N/A 07/12/2024   Procedure: DIALYSIS/PERMA CATHETER INSERTION;  Surgeon: Marea Selinda RAMAN, MD;  Location: [facility];  Service: Cardiovascular;  Laterality: N/A;   DIALYSIS/PERMA CATHETER REMOVAL N/A 03/15/2021   Procedure: DIALYSIS/PERMA CATHETER REMOVAL;  Surgeon: Marea Selinda RAMAN, MD;  Location: [facility];  Service: Cardiovascular;  Laterality: N/A;   DIALYSIS/PERMA CATHETER REMOVAL N/A 07/01/2024   Procedure: DIALYSIS/PERMA CATHETER REMOVAL;  Surgeon: Jama Cordella [name], MD;  Location: [facility];  Service: Cardiovascular;  Laterality: N/A;   REVISON OF ARTERIOVENOUS FISTULA Left 12/11/2020   Procedure: ARTERIOVENOUS (AV) FISTULA CREATION ( BRACHIAL CEPHALIC );  Surgeon: Jama Cordella [name], MD;  Location: [facility];  Service: Vascular;  Laterality: Left;   TEMPORARY DIALYSIS CATHETER N/A 03/15/2023   Procedure: TEMPORARY DIALYSIS CATHETER;  Surgeon: Jama Cordella [name], MD;  Location: [facility];  Service: Cardiovascular;  Laterality: N/A;    Home Medications:  Allergies as of 09/18/2024       Reactions   Benadryl  [diphenhydramine ] Other (See Comments)   Suspected hypersensitivity to benadryl  during past visit. Use with caution. Pt became obtunded and hypotensive.    Chlorhexidine  Other (See Comments)   Blisters (topical)        Medication List        Accurate as of September 18, 2024  1:54 PM. If you have any questions, ask your nurse or doctor.          STOP taking these medications    midodrine  10 MG tablet Commonly known as: PROAMATINE  Stopped by: Glendia Barba, MD       TAKE these medications    amLODipine  10 MG tablet Commonly known as: NORVASC  Take 1 tablet by mouth.   calcium  acetate 667 MG capsule Commonly known as: PHOSLO  Take 667 mg by mouth 3 (three) times daily with meals.   losartan  25 MG tablet Commonly known as: COZAAR  Take 1 tablet (25 mg total) by mouth daily.   Cozaar  100 MG tablet Generic  drug: losartan  Take 1 tablet by mouth.   multivitamin Tabs tablet Take 1 tablet by mouth daily.   tamsulosin  0.4 MG Caps capsule Commonly known as: FLOMAX  TAKE ONE CAPSULE BY MOUTH DAILY   Vitamin D -3 125 MCG (5000 UT) Tabs Take 5,000 Units by mouth daily.        Allergies: Allergies[1]  Family History: History reviewed. No pertinent family history.  Social History:  reports that he has never smoked. He has never been exposed to tobacco smoke. He has never used smokeless tobacco. He reports that he does not drink alcohol and does not use drugs.   Physical Exam: BP (!) 179/114   Pulse 60   Ht 5' 7 (1.702 m)   Wt 247 lb (112 kg)   BMI 38.69 kg/m   Constitutional:  Alert, No acute distress. HEENT: [place] AT Respiratory: Normal respiratory effort, no increased work of breathing. Psychiatric: Normal mood and affect.   Assessment & Plan:    1.  BPH with history urinary retention Tamsulosin /dutasteride  refilled Continue annual follow-up   Glendia JAYSON Barba, MD  [facility] [address] [place], [place] [address] [phone]    [1]  Allergies Allergen Reactions   Benadryl  [Diphenhydramine ] Other (See Comments)    Suspected  hypersensitivity to benadryl  during past visit. Use with caution. Pt became obtunded and hypotensive.    Chlorhexidine  Other (See Comments)    Blisters (topical)   "

## 2024-09-20 ENCOUNTER — Encounter: Payer: Self-pay | Admitting: Internal Medicine

## 2024-09-20 ENCOUNTER — Ambulatory Visit: Admitting: Internal Medicine

## 2024-09-20 ENCOUNTER — Other Ambulatory Visit: Payer: Self-pay | Admitting: Family

## 2024-09-20 VITALS — BP 170/108 | HR 83 | Temp 97.8°F | Ht 67.0 in | Wt 247.0 lb

## 2024-09-20 DIAGNOSIS — E782 Mixed hyperlipidemia: Secondary | ICD-10-CM | POA: Diagnosis not present

## 2024-09-20 DIAGNOSIS — I129 Hypertensive chronic kidney disease with stage 1 through stage 4 chronic kidney disease, or unspecified chronic kidney disease: Secondary | ICD-10-CM

## 2024-09-20 DIAGNOSIS — I10 Essential (primary) hypertension: Secondary | ICD-10-CM

## 2024-09-20 MED ORDER — OLMESARTAN MEDOXOMIL 40 MG PO TABS: 40.0000 mg | ORAL_TABLET | Freq: Every day | ORAL | 1 refills | Status: AC

## 2024-09-20 NOTE — Progress Notes (Signed)
 "  Established Patient Office Visit  Subjective:  Patient ID: Joseph Gilmore, male    DOB: [birth year]  Age: 54 y.o. MRN: [id number]  Chief Complaint  Patient presents with   Follow-up    2 week follow up bp    BP markedly elevated over the last few week even at dialysis. Metoprolol  and amlodipine  added by Nephrology over the last week. C/o headache but no dizziness.    No other concerns at this time.   Past Medical History:  Diagnosis Date   Autism    BPH (benign prostatic hyperplasia)    Complication of anesthesia    from 06-26-24 vascular procedure-pt woke up agitated and then became somnolent with no response with Narcan -admitted overnight to ICU   Dialysis patient    T, Th, Sat   ESRD (end stage renal disease) (HCC)    Hypertension    Hypertensive urgency 04/03/2020   IDA (iron deficiency anemia)    Murmur    Obesity (BMI 30-39.9)    OCD (obsessive compulsive disorder)    Sepsis (HCC) 06/30/2024    Past Surgical History:  Procedure Laterality Date   A/V FISTULAGRAM Left 10/07/2020   Procedure: A/V FISTULAGRAM;  Surgeon: Jama Cordella [name], MD;  Location: [facility];  Service: Cardiovascular;  Laterality: Left;   A/V FISTULAGRAM Left 02/15/2023   Procedure: A/V Fistulagram;  Surgeon: Jama Cordella [name], MD;  Location: [facility];  Service: Cardiovascular;  Laterality: Left;   A/V FISTULAGRAM Left 11/28/2023   Procedure: A/V Fistulagram;  Surgeon: Jama Cordella [name], MD;  Location: [facility];  Service: Cardiovascular;  Laterality: Left;   A/V FISTULAGRAM Left 12/26/2023   Procedure: A/V Fistulagram;  Surgeon: Jama Cordella [name], MD;  Location: [facility];  Service: Cardiovascular;  Laterality: Left;   AV FISTULA PLACEMENT Left 07/15/2020   Procedure: ARTERIOVENOUS FISTULA CREATION;  Surgeon: Jama Cordella [name], MD;  Location: [facility];  Service: Vascular;  Laterality: Left;   AV FISTULA PLACEMENT Left 10/04/2023   Procedure:  INSERTION OF ARTERIOVENOUS (AV) GORE-TEX GRAFT ARM (BRACHIAL AXILLARY);  Surgeon: Jama Cordella [name], MD;  Location: [facility];  Service: Vascular;  Laterality: Left;   DIALYSIS/PERMA CATHETER INSERTION N/A 04/06/2020   Procedure: DIALYSIS/PERMA CATHETER INSERTION;  Surgeon: Marea Selinda RAMAN, MD;  Location: [facility];  Service: Cardiovascular;  Laterality: N/A;   DIALYSIS/PERMA CATHETER INSERTION N/A 05/26/2020   Procedure: DIALYSIS/PERMA CATHETER INSERTION;  Surgeon: Jama Cordella [name], MD;  Location: [facility];  Service: Cardiovascular;  Laterality: N/A;   DIALYSIS/PERMA CATHETER INSERTION N/A 07/09/2020   Procedure: DIALYSIS/PERMA CATHETER INSERTION;  Surgeon: Marea Selinda RAMAN, MD;  Location: [facility];  Service: Cardiovascular;  Laterality: N/A;   DIALYSIS/PERMA CATHETER INSERTION N/A 01/08/2021   Procedure: DIALYSIS/PERMA CATHETER INSERTION;  Surgeon: Marea Selinda RAMAN, MD;  Location: [facility];  Service: Cardiovascular;  Laterality: N/A;   DIALYSIS/PERMA CATHETER INSERTION N/A 03/17/2023   Procedure: DIALYSIS/PERMA CATHETER INSERTION;  Surgeon: Jama Cordella [name], MD;  Location: [facility];  Service: Cardiovascular;  Laterality: N/A;   DIALYSIS/PERMA CATHETER INSERTION N/A 06/26/2024   Procedure: DIALYSIS/PERMA CATHETER INSERTION;  Surgeon: Marea Selinda RAMAN, MD;  Location: [facility];  Service: Cardiovascular;  Laterality: N/A;   DIALYSIS/PERMA CATHETER INSERTION N/A 07/05/2024   Procedure: DIALYSIS/PERMA CATHETER INSERTION;  Surgeon: Jama Cordella [name], MD;  Location: [facility];  Service: Cardiovascular;  Laterality: N/A;   DIALYSIS/PERMA CATHETER INSERTION N/A  07/12/2024   Procedure: DIALYSIS/PERMA CATHETER INSERTION;  Surgeon: Marea Selinda RAMAN, MD;  Location: [facility];  Service: Cardiovascular;  Laterality: N/A;   DIALYSIS/PERMA CATHETER REMOVAL N/A 03/15/2021   Procedure: DIALYSIS/PERMA CATHETER REMOVAL;  Surgeon: Marea Selinda RAMAN, MD;   Location: [facility];  Service: Cardiovascular;  Laterality: N/A;   DIALYSIS/PERMA CATHETER REMOVAL N/A 07/01/2024   Procedure: DIALYSIS/PERMA CATHETER REMOVAL;  Surgeon: Jama Cordella [name], MD;  Location: [facility];  Service: Cardiovascular;  Laterality: N/A;   REVISON OF ARTERIOVENOUS FISTULA Left 12/11/2020   Procedure: ARTERIOVENOUS (AV) FISTULA CREATION ( BRACHIAL CEPHALIC );  Surgeon: Jama Cordella [name], MD;  Location: [facility];  Service: Vascular;  Laterality: Left;   TEMPORARY DIALYSIS CATHETER N/A 03/15/2023   Procedure: TEMPORARY DIALYSIS CATHETER;  Surgeon: Jama Cordella [name], MD;  Location: [facility];  Service: Cardiovascular;  Laterality: N/A;    Social History   Socioeconomic History   Marital status: Single    Spouse name: Not on file   Number of children: Not on file   Years of education: Not on file   Highest education level: Not on file  Occupational History   Not on file  Tobacco Use   Smoking status: Never    Passive exposure: Never   Smokeless tobacco: Never  Vaping Use   Vaping status: Never Used  Substance and Sexual Activity   Alcohol use: Never   Drug use: Never   Sexual activity: Not Currently  Other Topics Concern   Not on file  Social History Narrative   Lives with Aunt Joseph Gilmore    Social Drivers of Health   Tobacco Use: Low Risk (09/18/2024)   Patient History    Smoking Tobacco Use: Never    Smokeless Tobacco Use: Never    Passive Exposure: Never  Financial Resource Strain: Not on file  Food Insecurity: No Food Insecurity (07/08/2024)   Epic    Worried About Programme Researcher, Broadcasting/film/video in the Last Year: Never true    Ran Out of Food in the Last Year: Never true  Transportation Needs: No Transportation Needs (07/08/2024)   Epic    Lack of Transportation (Medical): No    Lack of Transportation (Non-Medical): No  Physical Activity: Not on file  Stress: Not on file  Social Connections: Not on file  Intimate  Partner Violence: Not At Risk (07/08/2024)   Epic    Fear of Current or Ex-Partner: No    Emotionally Abused: No    Physically Abused: No    Sexually Abused: No  Depression (PHQ2-9): Low Risk (04/30/2024)   Depression (PHQ2-9)    PHQ-2 Score: 0  Alcohol Screen: Not on file  Housing: Unknown (07/08/2024)   Epic    Unable to Pay for Housing in the Last Year: No    Number of Times Moved in the Last Year: Not on file    Homeless in the Last Year: No  Utilities: Not At Risk (07/08/2024)   Epic    Threatened with loss of utilities: No  Health Literacy: Not on file    No family history on file.  Allergies[1]  Show/hide medication list[2]  Review of Systems  Constitutional:  Negative for weight loss.  HENT: Negative.    Eyes: Negative.   Respiratory: Negative.    Cardiovascular: Negative.   Gastrointestinal: Negative.   Genitourinary: Negative.   Skin: Negative.        As in hpi  Neurological:  Positive for  headaches. Negative for dizziness.  Endo/Heme/Allergies: Negative.        Objective:   BP (!) 170/108   Pulse 83   Temp 97.8 F (36.6 C)   Ht 5' 7 (1.702 m)   Wt 247 lb (112 kg)   SpO2 98%   BMI 38.69 kg/m   Vitals:   09/20/24 1018  BP: (!) 170/108  Pulse: 83  Temp: 97.8 F (36.6 C)  Height: 5' 7 (1.702 m)  Weight: 247 lb (112 kg)  SpO2: 98%  BMI (Calculated): 38.68    Physical Exam Vitals reviewed.  Constitutional:      Appearance: Normal appearance. He is obese.  HENT:     Head: Normocephalic.     Left Ear: There is no impacted cerumen.     Nose: Nose normal.     Mouth/Throat:     Mouth: Mucous membranes are moist.     Pharynx: No posterior oropharyngeal erythema.  Eyes:     Extraocular Movements: Extraocular movements intact.     Pupils: Pupils are equal, round, and reactive to light.  Cardiovascular:     Rate and Rhythm: Regular rhythm.     Chest Wall: PMI is not displaced.     Pulses: Normal pulses.     Heart sounds: Normal heart  sounds. No murmur heard.    Comments: Right groin HD catheter Pulmonary:     Effort: Pulmonary effort is normal.     Breath sounds: Normal air entry. Rhonchi present. No rales.  Abdominal:     General: Abdomen is flat. Bowel sounds are normal. There is no distension.     Palpations: Abdomen is soft. There is no hepatomegaly, splenomegaly or mass.     Tenderness: There is no abdominal tenderness.  Musculoskeletal:        General: Normal range of motion.     Cervical back: Normal range of motion and neck supple.     Right lower leg: No edema.     Left lower leg: No edema.  Skin:    General: Skin is warm and dry.  Neurological:     General: No focal deficit present.     Mental Status: He is alert and oriented to person, place, and time.     Cranial Nerves: No cranial nerve deficit.     Motor: No weakness.  Psychiatric:        Mood and Affect: Mood normal.        Behavior: Behavior normal.      No results found for any visits on 09/20/24.      Assessment & Plan:  Isa was seen today for follow-up.  Essential (primary) hypertension -     Olmesartan Medoxomil; Take 1 tablet (40 mg total) by mouth daily.  Dispense: 30 tablet; Refill: 1  Mixed hyperlipidemia  Hypertensive chronic kidney disease with stage 1 through stage 4 chronic kidney disease, or unspecified chronic kidney disease    Problem List Items Addressed This Visit       Cardiovascular and Mediastinum   Essential (primary) hypertension - Primary   Relevant Medications   olmesartan (BENICAR) 40 MG tablet     Genitourinary   Hypertensive chronic kidney disease with stage 1 through stage 4 chronic kidney disease, or unspecified chronic kidney disease   Other Visit Diagnoses       Mixed hyperlipidemia       Relevant Medications   olmesartan (BENICAR) 40 MG tablet       Return in about 2  weeks (around 10/04/2024) for BP followup.   Total time spent: 30 minutes. This time includes review of previous notes  and results and patient face to face interaction during today'Sinia Antosh visit.    Sherrill Cinderella Perry, MD  09/20/2024   This document may have been prepared by [facility] Voice Recognition software and as such may include unintentional dictation errors.     [1]  Allergies Allergen Reactions   Benadryl  [Diphenhydramine ] Other (See Comments)    Suspected hypersensitivity to benadryl  during past visit. Use with caution. Pt became obtunded and hypotensive.    Chlorhexidine  Other (See Comments)    Blisters (topical)  [2]  Outpatient Medications Prior to Visit  Medication Sig   amLODipine  (NORVASC ) 10 MG tablet Take 1 tablet by mouth.   calcium  acetate (PHOSLO ) 667 MG capsule Take 667 mg by mouth 3 (three) times daily with meals.   Cholecalciferol  (VITAMIN D -3) 125 MCG (5000 UT) TABS Take 5,000 Units by mouth daily.   dutasteride  (AVODART ) 0.5 MG capsule Take 1 capsule (0.5 mg total) by mouth daily.   multivitamin (RENA-VIT) TABS tablet Take 1 tablet by mouth daily.   tamsulosin  (FLOMAX ) 0.4 MG CAPS capsule Take 1 capsule (0.4 mg total) by mouth daily.   [DISCONTINUED] losartan  (COZAAR ) 25 MG tablet Take 1 tablet (25 mg total) by mouth daily.   [DISCONTINUED] COZAAR  100 MG tablet Take 1 tablet by mouth. (Patient not taking: Reported on 09/20/2024)   No facility-administered medications prior to visit.   "

## 2024-09-25 ENCOUNTER — Telehealth (INDEPENDENT_AMBULATORY_CARE_PROVIDER_SITE_OTHER): Payer: Self-pay

## 2024-09-25 ENCOUNTER — Encounter
Admission: RE | Admit: 2024-09-25 | Discharge: 2024-09-25 | Disposition: A | Discharge status: Home / Self Care | Source: Ambulatory Visit | Attending: Vascular Surgery | Admitting: Vascular Surgery

## 2024-09-25 DIAGNOSIS — Z01818 Encounter for other preprocedural examination: Secondary | ICD-10-CM

## 2024-09-25 HISTORY — DX: Secondary hyperparathyroidism of renal origin: N25.81

## 2024-09-25 NOTE — Patient Instructions (Signed)
 Your procedure is scheduled on:09-27-24 Friday Report to the Registration Desk on the 1st floor of the [facility].Then proceed to the 2nd floor Surgery Desk To find out your arrival time, please call [phone] between 1PM - 3PM on:09-26-24 Thursday If your arrival time is 6:00 am, do not arrive before that time as the [facility] entrance doors do not open until 6:00 am.  REMEMBER: Instructions that are not followed completely may result in serious medical risk, up to and including death; or upon the discretion of your surgeon and anesthesiologist your surgery may need to be rescheduled.  Do not eat food OR drink liquids after midnight the night before surgery.  No gum chewing or hard candies.  One week prior to surgery:Stop NOW (09-25-24) Stop Anti-inflammatories (NSAIDS) such as Advil, Aleve, Ibuprofen, Motrin, Naproxen, Naprosyn and Aspirin  based products such as Excedrin, Goody's Powder, BC Powder. Stop ANY OVER THE COUNTER supplements until after surgery.  You may however, continue to take Tylenol  if needed for pain up until the day of surgery.  Continue taking all of your other prescription medications up until the day of surgery.  ON THE DAY OF SURGERY ONLY TAKE THESE MEDICATIONS WITH SIPS OF WATER: -amLODipine  (NORVASC )  -dutasteride  (AVODART )  -metoprolol  succinate (TOPROL -XL)   No Alcohol for 24 hours before or after surgery.  No Smoking including e-cigarettes for 24 hours before surgery.  No chewable tobacco products for at least 6 hours before surgery.  No nicotine patches on the day of surgery.  Do not use any recreational drugs for at least a week (preferably 2 weeks) before your surgery.  Please be advised that the combination of cocaine and anesthesia may have negative outcomes, up to and including death. If you test positive for cocaine, your surgery will be cancelled.  On the morning of surgery brush your teeth with toothpaste and water, you may rinse your  mouth with mouthwash if you wish. Do not swallow any toothpaste or mouthwash.  Do not wear jewelry, make-up, hairpins, clips or nail polish.  For welded (permanent) jewelry: bracelets, anklets, waist bands, etc.  Please have this removed prior to surgery.  If it is not removed, there is a chance that hospital personnel will need to cut it off on the day of surgery.  Do not wear lotions, powders, or perfumes.   Do not shave body hair from the neck down 48 hours before surgery.  Contact lenses, hearing aids and dentures may not be worn into surgery.  Do not bring valuables to the hospital. [facility] is not responsible for any missing/lost belongings or valuables.   Notify your doctor if there is any change in your medical condition (cold, fever, infection).  Wear comfortable clothing (specific to your surgery type) to the hospital.  After surgery, you can help prevent lung complications by doing breathing exercises.  Take deep breaths and cough every 1-2 hours. Your doctor may order a device called an Incentive Spirometer to help you take deep breaths. When coughing or sneezing, hold a pillow firmly against your incision with both hands. This is called splinting. Doing this helps protect your incision. It also decreases belly discomfort.  If you are being admitted to the hospital overnight, leave your suitcase in the car. After surgery it may be brought to your room.  In case of increased patient census, it may be necessary for you, the patient, to continue your postoperative care in the Same Day Surgery department.  If you are being  discharged the day of surgery, you will not be allowed to drive home. You will need a responsible individual to drive you home and stay with you for 24 hours after surgery.   If you are taking public transportation, you will need to have a responsible individual with you.  Please call the Pre-admissions Testing Dept. at [phone] if you have any  questions about these instructions.  Surgery Visitation Policy:  Patients having surgery or a procedure may have two visitors.  Children under the age of 53 must have an adult with them who is not the patient.   Merchandiser, Retail to address health-related social needs:  [email or web address]

## 2024-09-25 NOTE — Telephone Encounter (Signed)
 Spoke with the patient's caregiver and the patient has been scheduled with Dr. Jama for a right AV graft placement on 09/27/24 at the [facility]. Pre-admit will call to schedule pre-op  at the [facility]. Pre-surgical instructions were discussed and will be sent to Mychart.

## 2024-09-26 ENCOUNTER — Other Ambulatory Visit (INDEPENDENT_AMBULATORY_CARE_PROVIDER_SITE_OTHER): Payer: Self-pay | Admitting: Nurse Practitioner

## 2024-09-26 DIAGNOSIS — N186 End stage renal disease: Secondary | ICD-10-CM

## 2024-09-27 ENCOUNTER — Ambulatory Visit: Admission: RE | Admit: 2024-09-27 | Source: Home / Self Care | Admitting: Vascular Surgery

## 2024-09-27 ENCOUNTER — Ambulatory Visit
Admission: RE | Admit: 2024-09-27 | Discharge: 2024-09-27 | Disposition: A | Discharge status: Home / Self Care | Attending: Vascular Surgery | Admitting: Vascular Surgery

## 2024-09-27 ENCOUNTER — Ambulatory Visit (INDEPENDENT_AMBULATORY_CARE_PROVIDER_SITE_OTHER): Admitting: Vascular Surgery

## 2024-09-27 ENCOUNTER — Encounter: Payer: Self-pay | Admitting: Vascular Surgery

## 2024-09-27 ENCOUNTER — Encounter: Admission: RE | Payer: Self-pay | Source: Home / Self Care

## 2024-09-27 ENCOUNTER — Encounter
Admission: RE | Disposition: A | Discharge status: Home / Self Care | Payer: Self-pay | Source: Home / Self Care | Attending: Vascular Surgery

## 2024-09-27 ENCOUNTER — Other Ambulatory Visit: Payer: Self-pay

## 2024-09-27 DIAGNOSIS — N186 End stage renal disease: Secondary | ICD-10-CM

## 2024-09-27 LAB — POTASSIUM (ARMC VASCULAR LAB ONLY): Potassium (ARMC vascular lab): 4.1 mmol/L (ref 3.5–5.1)

## 2024-09-27 MED ORDER — HEPARIN (PORCINE) IN NACL 1000-0.9 UT/500ML-% IV SOLN
INTRAVENOUS | Status: DC | PRN
  Administered 2024-09-27: 500 mL

## 2024-09-27 MED ORDER — CEFAZOLIN SODIUM-DEXTROSE 1-4 GM/50ML-% IV SOLN
1.0000 g | INTRAVENOUS | Status: AC
  Administered 2024-09-27: 1 g via INTRAVENOUS

## 2024-09-27 MED ORDER — CEFAZOLIN SODIUM-DEXTROSE 1-4 GM/50ML-% IV SOLN
INTRAVENOUS | Status: AC
  Filled 2024-09-27: qty 50

## 2024-09-27 MED ORDER — MIDAZOLAM HCL (PF) 2 MG/2ML IJ SOLN
INTRAMUSCULAR | Status: DC | PRN
  Administered 2024-09-27: 2 mg via INTRAVENOUS

## 2024-09-27 MED ORDER — FENTANYL CITRATE (PF) 100 MCG/2ML IJ SOLN
INTRAMUSCULAR | Status: DC | PRN
  Administered 2024-09-27: 50 ug via INTRAVENOUS

## 2024-09-27 MED ORDER — FAMOTIDINE 20 MG PO TABS: 40.0000 mg | ORAL_TABLET | Freq: Once | ORAL | Status: DC | PRN

## 2024-09-27 MED ORDER — IODIXANOL 320 MG/ML IV SOLN
INTRAVENOUS | Status: DC | PRN
  Administered 2024-09-27: 35 mL

## 2024-09-27 MED ORDER — HYDROMORPHONE HCL 1 MG/ML IJ SOLN: 1.0000 mg | Freq: Once | INTRAMUSCULAR | Status: DC | PRN

## 2024-09-27 MED ORDER — ONDANSETRON HCL 4 MG/2ML IJ SOLN: 4.0000 mg | Freq: Four times a day (QID) | INTRAMUSCULAR | Status: DC | PRN

## 2024-09-27 MED ORDER — HEPARIN SODIUM (PORCINE) 1000 UNIT/ML IJ SOLN
INTRAMUSCULAR | Status: AC
  Filled 2024-09-27: qty 10

## 2024-09-27 MED ORDER — FENTANYL CITRATE (PF) 50 MCG/ML IJ SOSY
PREFILLED_SYRINGE | INTRAMUSCULAR | Status: AC
  Filled 2024-09-27: qty 1

## 2024-09-27 MED ORDER — MIDAZOLAM HCL 2 MG/2ML IJ SOLN
INTRAMUSCULAR | Status: AC
  Filled 2024-09-27: qty 2

## 2024-09-27 MED ORDER — METHYLPREDNISOLONE SODIUM SUCC 125 MG IJ SOLR: 125.0000 mg | Freq: Once | INTRAMUSCULAR | Status: DC | PRN

## 2024-09-27 MED ORDER — LIDOCAINE HCL (PF) 1 % IJ SOLN
INTRAMUSCULAR | Status: DC | PRN
  Administered 2024-09-27: 10 mL

## 2024-09-27 MED ORDER — SODIUM CHLORIDE 0.9 % IV SOLN: INTRAVENOUS | Status: DC

## 2024-09-27 MED ORDER — MIDAZOLAM HCL 2 MG/ML PO SYRP: 8.0000 mg | ORAL_SOLUTION | Freq: Once | ORAL | Status: DC | PRN

## 2024-09-27 NOTE — H&P (View-Only) (Signed)
 "                     MRN : [id number]  Joseph Gilmore is a 54 y.o. (Mar 01, 1971) male who presents with chief complaint of check access.  History of Present Illness:   Patient presents to [facility] for venography to plan an upper extremity access.  He was last seen in the office July 04, 2024.  The patient has a history of multiple failed accesses.  There have been accesses in both arms which are nonfunctioning.    Current access is via a catheter which is functioning poorly the flow rates have been less than ideal.  There have been episodes of catheter infection.  The patient denies fever and chills while on dialysis.  No tenderness or drainage at the exit site.  No recent shortening of the patient's walking distance or new symptoms consistent with claudication.  No history of rest pain symptoms. No new ulcers or wounds of the lower extremities have occurred.  The patient denies amaurosis fugax or recent TIA symptoms. There are no recent neurological changes noted. There is no history of DVT, PE or superficial thrombophlebitis. No recent episodes of angina or shortness of breath documented.   Active Medications[1]  Past Medical History:  Diagnosis Date   Autism    BPH (benign prostatic hyperplasia)    Complication of anesthesia    from 06-26-24 vascular procedure-pt woke up agitated and then became somnolent with no response with Narcan -admitted overnight to ICU   Dialysis patient    T, Th, Sat   ESRD (end stage renal disease) (HCC)    Hypertension    Hypertensive urgency 04/03/2020   IDA (iron deficiency anemia)    Murmur    Obesity (BMI 30-39.9)    OCD (obsessive compulsive disorder)    Secondary hyperparathyroidism of renal origin    Sepsis (HCC) 06/30/2024    Past Surgical History:  Procedure Laterality Date   A/V FISTULAGRAM Left 10/07/2020   Procedure: A/V FISTULAGRAM;  Surgeon: Jama Cordella [name], MD;  Location: [facility];   Service: Cardiovascular;  Laterality: Left;   A/V FISTULAGRAM Left 02/15/2023   Procedure: A/V Fistulagram;  Surgeon: Jama Cordella [name], MD;  Location: [facility];  Service: Cardiovascular;  Laterality: Left;   A/V FISTULAGRAM Left 11/28/2023   Procedure: A/V Fistulagram;  Surgeon: Jama Cordella [name], MD;  Location: [facility];  Service: Cardiovascular;  Laterality: Left;   A/V FISTULAGRAM Left 12/26/2023   Procedure: A/V Fistulagram;  Surgeon: Jama Cordella [name], MD;  Location: [facility];  Service: Cardiovascular;  Laterality: Left;   AV FISTULA PLACEMENT Left 07/15/2020   Procedure: ARTERIOVENOUS FISTULA CREATION;  Surgeon: Jama Cordella [name], MD;  Location: [facility];  Service: Vascular;  Laterality: Left;   AV FISTULA PLACEMENT Left 10/04/2023   Procedure: INSERTION OF ARTERIOVENOUS (AV) GORE-TEX GRAFT ARM (BRACHIAL AXILLARY);  Surgeon: Jama Cordella [name], MD;  Location: [facility];  Service: Vascular;  Laterality: Left;   DIALYSIS/PERMA CATHETER INSERTION N/A 04/06/2020   Procedure: DIALYSIS/PERMA CATHETER INSERTION;  Surgeon: Marea Selinda RAMAN, MD;  Location: [facility];  Service: Cardiovascular;  Laterality: N/A;   DIALYSIS/PERMA CATHETER INSERTION N/A 05/26/2020   Procedure: DIALYSIS/PERMA CATHETER INSERTION;  Surgeon: Jama Cordella [name], MD;  Location: [facility];  Service: Cardiovascular;  Laterality: N/A;   DIALYSIS/PERMA CATHETER INSERTION N/A 07/09/2020   Procedure: DIALYSIS/PERMA CATHETER INSERTION;  Surgeon: Marea Selinda RAMAN,  MD;  Location: [facility];  Service: Cardiovascular;  Laterality: N/A;   DIALYSIS/PERMA CATHETER INSERTION N/A 01/08/2021   Procedure: DIALYSIS/PERMA CATHETER INSERTION;  Surgeon: Marea Selinda RAMAN, MD;  Location: [facility];  Service: Cardiovascular;  Laterality: N/A;   DIALYSIS/PERMA CATHETER INSERTION N/A 03/17/2023   Procedure: DIALYSIS/PERMA CATHETER INSERTION;  Surgeon: Jama Cordella [name], MD;  Location: [facility];  Service: Cardiovascular;  Laterality: N/A;   DIALYSIS/PERMA CATHETER INSERTION N/A 06/26/2024   Procedure: DIALYSIS/PERMA CATHETER INSERTION;  Surgeon: Marea Selinda RAMAN, MD;  Location: [facility];  Service: Cardiovascular;  Laterality: N/A;   DIALYSIS/PERMA CATHETER INSERTION N/A 07/05/2024   Procedure: DIALYSIS/PERMA CATHETER INSERTION;  Surgeon: Jama Cordella [name], MD;  Location: [facility];  Service: Cardiovascular;  Laterality: N/A;   DIALYSIS/PERMA CATHETER INSERTION N/A 07/12/2024   Procedure: DIALYSIS/PERMA CATHETER INSERTION;  Surgeon: Marea Selinda RAMAN, MD;  Location: [facility];  Service: Cardiovascular;  Laterality: N/A;   DIALYSIS/PERMA CATHETER REMOVAL N/A 03/15/2021   Procedure: DIALYSIS/PERMA CATHETER REMOVAL;  Surgeon: Marea Selinda RAMAN, MD;  Location: [facility];  Service: Cardiovascular;  Laterality: N/A;   DIALYSIS/PERMA CATHETER REMOVAL N/A 07/01/2024   Procedure: DIALYSIS/PERMA CATHETER REMOVAL;  Surgeon: Jama Cordella [name], MD;  Location: [facility];  Service: Cardiovascular;  Laterality: N/A;   REVISON OF ARTERIOVENOUS FISTULA Left 12/11/2020   Procedure: ARTERIOVENOUS (AV) FISTULA CREATION ( BRACHIAL CEPHALIC );  Surgeon: Jama Cordella [name], MD;  Location: [facility];  Service: Vascular;  Laterality: Left;   TEMPORARY DIALYSIS CATHETER N/A 03/15/2023   Procedure: TEMPORARY DIALYSIS CATHETER;  Surgeon: Jama Cordella [name], MD;  Location: [facility];  Service: Cardiovascular;  Laterality: N/A;    Social History Social History[2]  Family History No family history on file.  Allergies[3]   REVIEW OF SYSTEMS (Negative unless checked)  Constitutional: [] Weight loss  [] Fever  [] Chills Cardiac: [] Chest pain   [] Chest pressure   [] Palpitations   [] Shortness of breath when laying flat   [] Shortness of breath with exertion. Vascular:  [] Pain in legs with walking   [] Pain in legs at rest  [] History of DVT   [] Phlebitis    [] Swelling in legs   [] Varicose veins   [] Non-healing ulcers Pulmonary:   [] Uses home oxygen   [] Productive cough   [] Hemoptysis   [] Wheeze  [] COPD   [] Asthma Neurologic:  [] Dizziness   [] Seizures   [] History of stroke   [] History of TIA  [] Aphasia   [] Vissual changes   [] Weakness or numbness in arm   [] Weakness or numbness in leg Musculoskeletal:   [] Joint swelling   [] Joint pain   [] Low back pain Hematologic:  [] Easy bruising  [] Easy bleeding   [] Hypercoagulable state   [] Anemic Gastrointestinal:  [] Diarrhea   [] Vomiting  [] Gastroesophageal reflux/heartburn   [] Difficulty swallowing. Genitourinary:  [x] Chronic kidney disease   [] Difficult urination  [] Frequent urination   [] Blood in urine Skin:  [] Rashes   [] Ulcers  Psychological:  [] History of anxiety   []  History of major depression.  Physical Examination  There were no vitals filed for this visit. There is no height or weight on file to calculate BMI. Gen: WD/WN, NAD Head: [place]/AT, No temporalis wasting.  Ear/Nose/Throat: Hearing grossly intact, nares w/o erythema or drainage Eyes: PER, EOMI, sclera nonicteric.  Neck: Supple, no gross masses or lesions.  No JVD.  Pulmonary:  Good air movement, no audible wheezing, no use of accessory muscles.  Cardiac: RRR, precordium non-hyperdynamic.  Vascular:   Catheter clean dry and intact Vessel Right Left  Radial Palpable Palpable  Brachial Palpable Palpable  Gastrointestinal: soft, non-distended. No guarding/no peritoneal signs.  Musculoskeletal: M/S 5/5 throughout.  No deformity.  Neurologic: CN 2-12 intact. Pain and light touch intact in extremities.  Symmetrical.  Speech is fluent. Motor exam as listed above. Psychiatric: Judgment intact, Mood & affect appropriate for pt's clinical situation. Dermatologic: No rashes or ulcers noted.  No changes consistent with cellulitis.   CBC Lab Results  Component Value Date   WBC 10.6 (H) 07/31/2024   HGB 7.1 (L) 07/31/2024   HCT 23.8 (L)  07/31/2024   MCV 95.6 07/31/2024   PLT 333 07/31/2024    BMET    Component Value Date/Time   NA 144 07/31/2024 1810   NA 140 05/06/2024 0936   K 4.3 07/31/2024 1810   CL 102 07/31/2024 1810   CO2 25 07/31/2024 1810   GLUCOSE 78 07/31/2024 1810   BUN 32 (H) 07/31/2024 1810   BUN 18 05/06/2024 0936   CREATININE 14.20 (H) 07/31/2024 1810   CALCIUM  7.8 (L) 07/31/2024 1810   GFRNONAA 4 (L) 07/31/2024 1810   GFRAA 8 (L) 04/24/2020 1219   CrCl cannot be calculated (Patient's most recent lab result is older than the maximum 21 days allowed.).  COAG Lab Results  Component Value Date   INR 1.3 (H) 06/30/2024   INR 1.1 12/09/2020   INR 1.1 07/13/2020    Radiology No results found.   Assessment/Plan 1. ESRD (end stage renal disease) (HCC) (Primary) Recommend:   At this time the patient does not have appropriate extremity access for dialysis.  Patient should undergo venography of both upper extremities with central imaging given his history of multiple failed access in the past.   I am hopeful that based on the venography findings the patient chould have a right AV graft insertion.  However, I have advised that if it is found that during surgery he is noted to have a larger vein than what was noted a brachiocephalic fistula can be created instead.   The risks, benefits and alternative therapies were reviewed in detail with the patient.  All questions were answered.  The patient agrees to proceed with surgery.    The patient will follow up with me in the office after the surgery.   2. Essential hypertension Continue antihypertensive medications as already ordered, these medications have been reviewed and there are no changes at this time.     Cordella Shawl, MD  09/27/2024 9:01 AM      [1]  Current Meds  Medication Sig   amLODipine  (NORVASC ) 10 MG tablet Take 10 mg by mouth every morning.   calcium  acetate (PHOSLO ) 667 MG capsule Take 667 mg by mouth 3 (three)  times daily with meals.   Cholecalciferol  (VITAMIN D -3) 125 MCG (5000 UT) TABS Take 5,000 Units by mouth daily.   dutasteride  (AVODART ) 0.5 MG capsule Take 1 capsule (0.5 mg total) by mouth daily. (Patient taking differently: Take 0.5 mg by mouth every morning.)   metoprolol  succinate (TOPROL -XL) 25 MG 24 hr tablet Take 25 mg by mouth every morning.   multivitamin (RENA-VIT) TABS tablet Take 1 tablet by mouth daily.   olmesartan  (BENICAR ) 40 MG tablet Take 1 tablet (40 mg total) by mouth daily. (Patient taking differently: Take 40 mg by mouth every morning.)   tamsulosin  (FLOMAX ) 0.4 MG CAPS capsule Take 1 capsule (0.4 mg total) by mouth daily. (Patient taking differently: Take  0.4 mg by mouth daily after supper.)  [2]  Social History Tobacco Use   Smoking status: Never    Passive exposure: Never   Smokeless tobacco: Never  Vaping Use   Vaping status: Never Used  Substance Use Topics   Alcohol use: Never   Drug use: Never  [3]  Allergies Allergen Reactions   Benadryl  [Diphenhydramine ] Other (See Comments)    Suspected hypersensitivity to benadryl  during past visit. Use with caution. Pt became obtunded and hypotensive.    Chlorhexidine  Other (See Comments)    Blisters (topical)   "

## 2024-09-27 NOTE — Interval H&P Note (Signed)
 History and Physical Interval Note:  09/27/2024 9:07 AM  Joseph Gilmore  has presented today for surgery, with the diagnosis of Bilateral UE Venogram    ESR.  The various methods of treatment have been discussed with the patient and family. After consideration of risks, benefits and other options for treatment, the patient has consented to  Procedures: UPPER EXTREMITY VENOGRAPHY (Bilateral) as a surgical intervention.  The patient's history has been reviewed, patient examined, no change in status, stable for surgery.  I have reviewed the patient's chart and labs.  Questions were answered to the patient's satisfaction.     Cordella Shawl

## 2024-09-27 NOTE — Op Note (Signed)
 [facility]  Percutaneous Study/Intervention Procedural Note   Date of Surgery: 09/27/2024,11:18 AM  Surgeon:Sophie Tamez, Cordella [name]   Pre-operative Diagnosis: Superior vena cava syndrome; Complication AV vascular device with multiple past accesses; and stage renal disease requiring hemodialysis  Post-operative diagnosis:  Same  Procedure(s) Performed:  1.  Ultrasound-guided access to the right brachial vein  2.  Introduction catheter into superior vena cava right arm approach  3.  Right upper extremity venogram  4.  Ultrasound-guided access to the left brachial vein  5.  Introduction catheter into superior vena cava left arm approach  6.  Left upper extremity venogram  7.  Superior venacavogram  Anesthesia: Conscious sedation was administered under my direct supervision by the interventional radiology RN. IV Versed  plus fentanyl  were utilized. Continuous ECG, pulse oximetry and blood pressure was monitored throughout the entire procedure. Conscious sedation was administered for a total of 18 minutes minutes.  Sheath: 4 French micro sheath antegrade right brachial vein  Contrast: 35 cc   Fluoroscopy Time: 1.4 minutes  Indications:  Patient presents for evaluation for possible hero graft placement. 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 Venography is being performed to determine whether placement of a hero graft is feasible. The risks and benefits of been reviewed all questions answered patient agrees to proceed.  Procedure:  Joseph Gilmore IIis a 54 y.o. male who was identified and appropriate procedural time out was performed.  The patient was then placed supine on the table and prepped and draped in the usual sterile fashion.  Ultrasound was used to evaluate the right basilic vein.  It was patent.  A digital ultrasound image was acquired.  A micro-puncture needle was used to access the  right basilic vein under direct ultrasound guidance and a permanent image was saved for the record.  The microwire was then advanced under fluoroscopic guidance followed by micro-sheath.  Hand injection of contrast was then performed to demonstrate the venous anatomy of the right upper arm.  The central images were not adequate and therefore a floppy Glidewire was advanced through the micro-sheath and negotiated into the central venous system. The micro-sheath was removed and a Kumpe catheter advanced over the wire. Wire was then removed and with the catheter positioned in the central venous anatomy and the superior vena cava further imaging was obtained by hand injection. After review these images attention were turned to the opposite arm.   Findings:    Right upper extremity: The basilic vein and visualized portions of the brachial veins are widely patent.  Axillary vein is patent and appears to be of adequate caliber for a AV graft.  The subclavian and [place] and superior vena cava are widely patent.  Incidental notation of a high bifurcation of the radial artery by ultrasound.  Summary: Patient appears to be a candidate for a right brachial axillary AV graft     Disposition: Patient was taken to the recovery room in stable condition having tolerated the procedure well.  [name] 09/27/2024,11:18 AM

## 2024-09-27 NOTE — Progress Notes (Signed)
 "                     MRN : [id number]  Joseph Gilmore is a 54 y.o. (Mar 01, 1971) male who presents with chief complaint of check access.  History of Present Illness:   Patient presents to [facility] for venography to plan an upper extremity access.  He was last seen in the office July 04, 2024.  The patient has a history of multiple failed accesses.  There have been accesses in both arms which are nonfunctioning.    Current access is via a catheter which is functioning poorly the flow rates have been less than ideal.  There have been episodes of catheter infection.  The patient denies fever and chills while on dialysis.  No tenderness or drainage at the exit site.  No recent shortening of the patient's walking distance or new symptoms consistent with claudication.  No history of rest pain symptoms. No new ulcers or wounds of the lower extremities have occurred.  The patient denies amaurosis fugax or recent TIA symptoms. There are no recent neurological changes noted. There is no history of DVT, PE or superficial thrombophlebitis. No recent episodes of angina or shortness of breath documented.   Active Medications[1]  Past Medical History:  Diagnosis Date   Autism    BPH (benign prostatic hyperplasia)    Complication of anesthesia    from 06-26-24 vascular procedure-pt woke up agitated and then became somnolent with no response with Narcan -admitted overnight to ICU   Dialysis patient    T, Th, Sat   ESRD (end stage renal disease) (HCC)    Hypertension    Hypertensive urgency 04/03/2020   IDA (iron deficiency anemia)    Murmur    Obesity (BMI 30-39.9)    OCD (obsessive compulsive disorder)    Secondary hyperparathyroidism of renal origin    Sepsis (HCC) 06/30/2024    Past Surgical History:  Procedure Laterality Date   A/V FISTULAGRAM Left 10/07/2020   Procedure: A/V FISTULAGRAM;  Surgeon: Jama Cordella [name], MD;  Location: [facility];   Service: Cardiovascular;  Laterality: Left;   A/V FISTULAGRAM Left 02/15/2023   Procedure: A/V Fistulagram;  Surgeon: Jama Cordella [name], MD;  Location: [facility];  Service: Cardiovascular;  Laterality: Left;   A/V FISTULAGRAM Left 11/28/2023   Procedure: A/V Fistulagram;  Surgeon: Jama Cordella [name], MD;  Location: [facility];  Service: Cardiovascular;  Laterality: Left;   A/V FISTULAGRAM Left 12/26/2023   Procedure: A/V Fistulagram;  Surgeon: Jama Cordella [name], MD;  Location: [facility];  Service: Cardiovascular;  Laterality: Left;   AV FISTULA PLACEMENT Left 07/15/2020   Procedure: ARTERIOVENOUS FISTULA CREATION;  Surgeon: Jama Cordella [name], MD;  Location: [facility];  Service: Vascular;  Laterality: Left;   AV FISTULA PLACEMENT Left 10/04/2023   Procedure: INSERTION OF ARTERIOVENOUS (AV) GORE-TEX GRAFT ARM (BRACHIAL AXILLARY);  Surgeon: Jama Cordella [name], MD;  Location: [facility];  Service: Vascular;  Laterality: Left;   DIALYSIS/PERMA CATHETER INSERTION N/A 04/06/2020   Procedure: DIALYSIS/PERMA CATHETER INSERTION;  Surgeon: Marea Selinda RAMAN, MD;  Location: [facility];  Service: Cardiovascular;  Laterality: N/A;   DIALYSIS/PERMA CATHETER INSERTION N/A 05/26/2020   Procedure: DIALYSIS/PERMA CATHETER INSERTION;  Surgeon: Jama Cordella [name], MD;  Location: [facility];  Service: Cardiovascular;  Laterality: N/A;   DIALYSIS/PERMA CATHETER INSERTION N/A 07/09/2020   Procedure: DIALYSIS/PERMA CATHETER INSERTION;  Surgeon: Marea Selinda RAMAN,  MD;  Location: [facility];  Service: Cardiovascular;  Laterality: N/A;   DIALYSIS/PERMA CATHETER INSERTION N/A 01/08/2021   Procedure: DIALYSIS/PERMA CATHETER INSERTION;  Surgeon: Marea Selinda RAMAN, MD;  Location: [facility];  Service: Cardiovascular;  Laterality: N/A;   DIALYSIS/PERMA CATHETER INSERTION N/A 03/17/2023   Procedure: DIALYSIS/PERMA CATHETER INSERTION;  Surgeon: Jama Cordella [name], MD;  Location: [facility];  Service: Cardiovascular;  Laterality: N/A;   DIALYSIS/PERMA CATHETER INSERTION N/A 06/26/2024   Procedure: DIALYSIS/PERMA CATHETER INSERTION;  Surgeon: Marea Selinda RAMAN, MD;  Location: [facility];  Service: Cardiovascular;  Laterality: N/A;   DIALYSIS/PERMA CATHETER INSERTION N/A 07/05/2024   Procedure: DIALYSIS/PERMA CATHETER INSERTION;  Surgeon: Jama Cordella [name], MD;  Location: [facility];  Service: Cardiovascular;  Laterality: N/A;   DIALYSIS/PERMA CATHETER INSERTION N/A 07/12/2024   Procedure: DIALYSIS/PERMA CATHETER INSERTION;  Surgeon: Marea Selinda RAMAN, MD;  Location: [facility];  Service: Cardiovascular;  Laterality: N/A;   DIALYSIS/PERMA CATHETER REMOVAL N/A 03/15/2021   Procedure: DIALYSIS/PERMA CATHETER REMOVAL;  Surgeon: Marea Selinda RAMAN, MD;  Location: [facility];  Service: Cardiovascular;  Laterality: N/A;   DIALYSIS/PERMA CATHETER REMOVAL N/A 07/01/2024   Procedure: DIALYSIS/PERMA CATHETER REMOVAL;  Surgeon: Jama Cordella [name], MD;  Location: [facility];  Service: Cardiovascular;  Laterality: N/A;   REVISON OF ARTERIOVENOUS FISTULA Left 12/11/2020   Procedure: ARTERIOVENOUS (AV) FISTULA CREATION ( BRACHIAL CEPHALIC );  Surgeon: Jama Cordella [name], MD;  Location: [facility];  Service: Vascular;  Laterality: Left;   TEMPORARY DIALYSIS CATHETER N/A 03/15/2023   Procedure: TEMPORARY DIALYSIS CATHETER;  Surgeon: Jama Cordella [name], MD;  Location: [facility];  Service: Cardiovascular;  Laterality: N/A;    Social History Social History[2]  Family History No family history on file.  Allergies[3]   REVIEW OF SYSTEMS (Negative unless checked)  Constitutional: [] Weight loss  [] Fever  [] Chills Cardiac: [] Chest pain   [] Chest pressure   [] Palpitations   [] Shortness of breath when laying flat   [] Shortness of breath with exertion. Vascular:  [] Pain in legs with walking   [] Pain in legs at rest  [] History of DVT   [] Phlebitis    [] Swelling in legs   [] Varicose veins   [] Non-healing ulcers Pulmonary:   [] Uses home oxygen   [] Productive cough   [] Hemoptysis   [] Wheeze  [] COPD   [] Asthma Neurologic:  [] Dizziness   [] Seizures   [] History of stroke   [] History of TIA  [] Aphasia   [] Vissual changes   [] Weakness or numbness in arm   [] Weakness or numbness in leg Musculoskeletal:   [] Joint swelling   [] Joint pain   [] Low back pain Hematologic:  [] Easy bruising  [] Easy bleeding   [] Hypercoagulable state   [] Anemic Gastrointestinal:  [] Diarrhea   [] Vomiting  [] Gastroesophageal reflux/heartburn   [] Difficulty swallowing. Genitourinary:  [x] Chronic kidney disease   [] Difficult urination  [] Frequent urination   [] Blood in urine Skin:  [] Rashes   [] Ulcers  Psychological:  [] History of anxiety   []  History of major depression.  Physical Examination  There were no vitals filed for this visit. There is no height or weight on file to calculate BMI. Gen: WD/WN, NAD Head: [place]/AT, No temporalis wasting.  Ear/Nose/Throat: Hearing grossly intact, nares w/o erythema or drainage Eyes: PER, EOMI, sclera nonicteric.  Neck: Supple, no gross masses or lesions.  No JVD.  Pulmonary:  Good air movement, no audible wheezing, no use of accessory muscles.  Cardiac: RRR, precordium non-hyperdynamic.  Vascular:   Catheter clean dry and intact Vessel Right Left  Radial Palpable Palpable  Brachial Palpable Palpable  Gastrointestinal: soft, non-distended. No guarding/no peritoneal signs.  Musculoskeletal: M/S 5/5 throughout.  No deformity.  Neurologic: CN 2-12 intact. Pain and light touch intact in extremities.  Symmetrical.  Speech is fluent. Motor exam as listed above. Psychiatric: Judgment intact, Mood & affect appropriate for pt's clinical situation. Dermatologic: No rashes or ulcers noted.  No changes consistent with cellulitis.   CBC Lab Results  Component Value Date   WBC 10.6 (H) 07/31/2024   HGB 7.1 (L) 07/31/2024   HCT 23.8 (L)  07/31/2024   MCV 95.6 07/31/2024   PLT 333 07/31/2024    BMET    Component Value Date/Time   NA 144 07/31/2024 1810   NA 140 05/06/2024 0936   K 4.3 07/31/2024 1810   CL 102 07/31/2024 1810   CO2 25 07/31/2024 1810   GLUCOSE 78 07/31/2024 1810   BUN 32 (H) 07/31/2024 1810   BUN 18 05/06/2024 0936   CREATININE 14.20 (H) 07/31/2024 1810   CALCIUM  7.8 (L) 07/31/2024 1810   GFRNONAA 4 (L) 07/31/2024 1810   GFRAA 8 (L) 04/24/2020 1219   CrCl cannot be calculated (Patient's most recent lab result is older than the maximum 21 days allowed.).  COAG Lab Results  Component Value Date   INR 1.3 (H) 06/30/2024   INR 1.1 12/09/2020   INR 1.1 07/13/2020    Radiology No results found.   Assessment/Plan 1. ESRD (end stage renal disease) (HCC) (Primary) Recommend:   At this time the patient does not have appropriate extremity access for dialysis.  Patient should undergo venography of both upper extremities with central imaging given his history of multiple failed access in the past.   I am hopeful that based on the venography findings the patient chould have a right AV graft insertion.  However, I have advised that if it is found that during surgery he is noted to have a larger vein than what was noted a brachiocephalic fistula can be created instead.   The risks, benefits and alternative therapies were reviewed in detail with the patient.  All questions were answered.  The patient agrees to proceed with surgery.    The patient will follow up with me in the office after the surgery.   2. Essential hypertension Continue antihypertensive medications as already ordered, these medications have been reviewed and there are no changes at this time.     Cordella Shawl, MD  09/27/2024 9:01 AM      [1]  Current Meds  Medication Sig   amLODipine  (NORVASC ) 10 MG tablet Take 10 mg by mouth every morning.   calcium  acetate (PHOSLO ) 667 MG capsule Take 667 mg by mouth 3 (three)  times daily with meals.   Cholecalciferol  (VITAMIN D -3) 125 MCG (5000 UT) TABS Take 5,000 Units by mouth daily.   dutasteride  (AVODART ) 0.5 MG capsule Take 1 capsule (0.5 mg total) by mouth daily. (Patient taking differently: Take 0.5 mg by mouth every morning.)   metoprolol  succinate (TOPROL -XL) 25 MG 24 hr tablet Take 25 mg by mouth every morning.   multivitamin (RENA-VIT) TABS tablet Take 1 tablet by mouth daily.   olmesartan  (BENICAR ) 40 MG tablet Take 1 tablet (40 mg total) by mouth daily. (Patient taking differently: Take 40 mg by mouth every morning.)   tamsulosin  (FLOMAX ) 0.4 MG CAPS capsule Take 1 capsule (0.4 mg total) by mouth daily. (Patient taking differently: Take  0.4 mg by mouth daily after supper.)  [2]  Social History Tobacco Use   Smoking status: Never    Passive exposure: Never   Smokeless tobacco: Never  Vaping Use   Vaping status: Never Used  Substance Use Topics   Alcohol use: Never   Drug use: Never  [3]  Allergies Allergen Reactions   Benadryl  [Diphenhydramine ] Other (See Comments)    Suspected hypersensitivity to benadryl  during past visit. Use with caution. Pt became obtunded and hypotensive.    Chlorhexidine  Other (See Comments)    Blisters (topical)   "

## 2024-10-01 ENCOUNTER — Telehealth (INDEPENDENT_AMBULATORY_CARE_PROVIDER_SITE_OTHER): Payer: Self-pay

## 2024-10-01 NOTE — Telephone Encounter (Signed)
 Spoke with the patient's caregiver Gustav and the patient is scheduled for a right AV graft on 10/18/24 at the [facility]. Pre-admit will call to schedule pre-op  at the [facility]. Pre-surgical instructions were discussed and will be sent to MyChart and mailed.

## 2024-10-11 ENCOUNTER — Ambulatory Visit: Admitting: Internal Medicine

## 2024-10-18 ENCOUNTER — Ambulatory Visit: Admit: 2024-10-18 | Admitting: Vascular Surgery

## 2024-12-16 ENCOUNTER — Ambulatory Visit: Admitting: Internal Medicine

## 2025-09-23 ENCOUNTER — Ambulatory Visit: Admitting: Urology

## 2025-09-24 ENCOUNTER — Ambulatory Visit: Admitting: Urology
